# Patient Record
Sex: Male | Born: 1955 | Hispanic: No | Marital: Married | Smoking: Never smoker
Health system: Southern US, Community
[De-identification: ages and names within clinical notes are randomized; demographics above are authoritative.]

## PROBLEM LIST (undated history)

## (undated) ENCOUNTER — Emergency Department (HOSPITAL_COMMUNITY)

## (undated) DIAGNOSIS — N201 Calculus of ureter: Secondary | ICD-10-CM

## (undated) DIAGNOSIS — Z86010 Personal history of colonic polyps: Secondary | ICD-10-CM

## (undated) DIAGNOSIS — R9431 Abnormal electrocardiogram [ECG] [EKG]: Secondary | ICD-10-CM

## (undated) DIAGNOSIS — Z9221 Personal history of antineoplastic chemotherapy: Secondary | ICD-10-CM

## (undated) DIAGNOSIS — E785 Hyperlipidemia, unspecified: Secondary | ICD-10-CM

## (undated) DIAGNOSIS — E119 Type 2 diabetes mellitus without complications: Secondary | ICD-10-CM

## (undated) DIAGNOSIS — E669 Obesity, unspecified: Secondary | ICD-10-CM

## (undated) DIAGNOSIS — F411 Generalized anxiety disorder: Secondary | ICD-10-CM

## (undated) DIAGNOSIS — K589 Irritable bowel syndrome without diarrhea: Secondary | ICD-10-CM

## (undated) DIAGNOSIS — Z860101 Personal history of adenomatous and serrated colon polyps: Secondary | ICD-10-CM

## (undated) DIAGNOSIS — E291 Testicular hypofunction: Secondary | ICD-10-CM

## (undated) DIAGNOSIS — Z794 Long term (current) use of insulin: Secondary | ICD-10-CM

## (undated) DIAGNOSIS — Z973 Presence of spectacles and contact lenses: Secondary | ICD-10-CM

## (undated) DIAGNOSIS — D649 Anemia, unspecified: Secondary | ICD-10-CM

## (undated) DIAGNOSIS — G4700 Insomnia, unspecified: Secondary | ICD-10-CM

## (undated) DIAGNOSIS — E559 Vitamin D deficiency, unspecified: Secondary | ICD-10-CM

## (undated) HISTORY — DX: Type 2 diabetes mellitus without complications: E11.9

## (undated) HISTORY — DX: Hyperlipidemia, unspecified: E78.5

## (undated) HISTORY — PX: JOINT REPLACEMENT: SHX530

## (undated) HISTORY — DX: Vitamin D deficiency, unspecified: E55.9

## (undated) HISTORY — DX: Testicular hypofunction: E29.1

## (undated) HISTORY — PX: SHOULDER SURGERY: SHX246

## (undated) HISTORY — DX: Irritable bowel syndrome, unspecified: K58.9

## (undated) HISTORY — DX: Abnormal electrocardiogram (ECG) (EKG): R94.31

## (undated) HISTORY — DX: Obesity, unspecified: E66.9

---

## 1986-11-18 HISTORY — PX: APPENDECTOMY: SHX54

## 2005-11-21 LAB — HM COLONOSCOPY

## 2011-05-28 ENCOUNTER — Encounter: Payer: Self-pay | Admitting: *Deleted

## 2011-05-28 ENCOUNTER — Encounter: Payer: Self-pay | Admitting: Cardiology

## 2011-05-29 ENCOUNTER — Ambulatory Visit (INDEPENDENT_AMBULATORY_CARE_PROVIDER_SITE_OTHER): Payer: PRIVATE HEALTH INSURANCE | Admitting: Cardiology

## 2011-05-29 ENCOUNTER — Encounter: Payer: Self-pay | Admitting: Cardiology

## 2011-05-29 VITALS — BP 116/80 | HR 68 | Ht 71.0 in | Wt 216.0 lb

## 2011-05-29 DIAGNOSIS — R9431 Abnormal electrocardiogram [ECG] [EKG]: Secondary | ICD-10-CM

## 2011-05-29 DIAGNOSIS — E785 Hyperlipidemia, unspecified: Secondary | ICD-10-CM

## 2011-05-29 DIAGNOSIS — E1169 Type 2 diabetes mellitus with other specified complication: Secondary | ICD-10-CM | POA: Insufficient documentation

## 2011-05-29 DIAGNOSIS — R079 Chest pain, unspecified: Secondary | ICD-10-CM

## 2011-05-29 NOTE — Assessment & Plan Note (Addendum)
LDL was very high when checked in 5/12.  GIven diabetes, I would like to see LDL at least < 100.  Patient has been started on Crestor and is tolerating it so far.

## 2011-05-29 NOTE — Progress Notes (Signed)
PCP: Dr. Oneta Rack  55 yo with history of diabetes and hyperlipidemia presents for evaluation of abnormal ECG.  Patient saw an MD several weeks ago in another town for an insurance physical.  He was noted to have type II diabetes. A Q wave was noted in lead III, causing concern for possible old inferior MI.  He has since established Dr. Oneta Rack as PCP in [place] and has been started on treatment for diabetes and hyperlipidemia.  ECGs have continued to be mildly abnormal.  Today, ECG shows a Q in III as well as minor slight ST depression in the anterolateral leads.    In general, patient has been doing well symptomatically.  About 15 years ago, he had an episode of severe chest pain while playing golf.  This lasted 1-2 minutes.  He saw a doctor at the time and was told that the pain was caused by stress-induced PVCs.   About 3 months ago, patient had a similar episode of very severe substernal chest pain.  This occurred while he was watching TV and lasted < 1 minute.  He is active in general.  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 No exertional chest pain or dyspnea. He will occasionally get mild substernal chest tightness that seems to be associated with stress.    ECG: NSR, minor slight ST depression in the anterolateral leads, Q in lead III  Labs (5/12): HCT 45.4, TSH normal, LDL 186, K 4.2, creatinine 0.9, hgbA1c 7.1  PMH: 1. Type II diabetes 2. 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  Nonsmoker.  Occasional ETOH.   FH: No coronary disease.  Mother with pancreatic cancer.  Father died with ? Encephalitis.   ROS: All systems reviewed and negative except as per HPI.   Current Outpatient Prescriptions  Medication Sig Dispense Refill  . aspirin 81 MG tablet Take 81 mg by mouth 2 (two) times daily.        . metFORMIN (GLUMETZA) 1000 MG (MOD) 24 hr tablet Take 1,000 mg by mouth 2 (two) times daily with  a meal.        . rosuvastatin (CRESTOR) 20 MG tablet Take 20 mg by mouth daily.          BP 116/80  Pulse 68  Ht 5\' 11"  (1.803 m)  Wt 216 lb (97.977 kg)  BMI 30.13 kg/m2 General: NAD Neck: No JVD, no thyromegaly or thyroid nodule.  Lungs: Clear to auscultation bilaterally with normal respiratory effort. CV: Nondisplaced PMI.  Heart regular S1/S2, no S3/S4, no murmur.  No peripheral edema.  No carotid bruit.  Normal pedal pulses.  Abdomen: Soft, nontender, no hepatosplenomegaly, no distention.  Skin: Intact without lesions or rashes.  Neurologic: Alert and oriented x 3.  Psych: Normal affect. Extremities: No clubbing or cyanosis.  HEENT: Normal.

## 2011-05-29 NOTE — Patient Instructions (Addendum)
Your physician recommends that you schedule a follow-up appointment in: 2- 3 WEEKS WITH DR [facility]  Your physician recommends that you continue on your current medications as directed. Please refer to the Current Medication list given to you today. Your physician has requested that you have en exercise stress myoview. For further information please visit [email or web address] Please follow instruction sheet, as given.  PT'S CONVENIENCE  DX ABN EKG

## 2011-05-29 NOTE — Assessment & Plan Note (Signed)
Patient had an episode of severe but brief chest pain several months ago.  He gets mild occasional stress-induced chest tightness. He does not get exertional symptoms and has good exercise tolerance.  His ECG has some abnormality but is not specific.  Given the fact that he has diabetes, I think that the safest course will be risk stratification with an ETT-myoview.  I will arrange for this.  He will continue ASA 81 mg daily.

## 2011-06-04 ENCOUNTER — Ambulatory Visit (HOSPITAL_COMMUNITY): Payer: PRIVATE HEALTH INSURANCE | Attending: Cardiology | Admitting: Radiology

## 2011-06-04 VITALS — Ht 71.0 in | Wt 216.0 lb

## 2011-06-04 DIAGNOSIS — R0789 Other chest pain: Secondary | ICD-10-CM

## 2011-06-04 DIAGNOSIS — R0609 Other forms of dyspnea: Secondary | ICD-10-CM

## 2011-06-04 DIAGNOSIS — R9431 Abnormal electrocardiogram [ECG] [EKG]: Secondary | ICD-10-CM

## 2011-06-04 DIAGNOSIS — R0989 Other specified symptoms and signs involving the circulatory and respiratory systems: Secondary | ICD-10-CM

## 2011-06-04 DIAGNOSIS — E119 Type 2 diabetes mellitus without complications: Secondary | ICD-10-CM

## 2011-06-04 DIAGNOSIS — I4949 Other premature depolarization: Secondary | ICD-10-CM

## 2011-06-04 MED ORDER — TECHNETIUM TC 99M TETROFOSMIN IV KIT
11.0000 | PACK | Freq: Once | INTRAVENOUS | Status: AC | PRN
  Administered 2011-06-04: 11 via INTRAVENOUS

## 2011-06-04 MED ORDER — TECHNETIUM TC 99M TETROFOSMIN IV KIT
33.0000 | PACK | Freq: Once | INTRAVENOUS | Status: AC | PRN
  Administered 2011-06-04: 33 via INTRAVENOUS

## 2011-06-04 NOTE — Progress Notes (Addendum)
[facility] SITE 3 NUCLEAR MED [address] [place] [address] [phone]  Cardiology Nuclear Med Study  Zachary Reid is a 55 y.o. male [id number] 10/13/1956   Nuclear Med Background Indication for Stress Test:  Evaluation for Ischemia and Abnormal EKG History:  No previous documented CAD Cardiac Risk Factors: Lipids and NIDDM  Symptoms:  Chest Pain, Chest Tightness, DOE, Fatigue and Palpitations   Nuclear Pre-Procedure Caffeine/Decaff Intake:  None NPO After: 7:30pm   Lungs:  clear IV 0.9% NS with Angio Cath:  20g  IV Site: R Antecubital  IV Started by:  Irean Hong, RN  Chest Size (in):  46 Cup Size: n/a  Height: 5\' 11"  (1.803 m)  Weight:  216 lb (97.977 kg)  BMI:  Body mass index is 30.13 kg/(m^2). Tech Comments:  n/a    Nuclear Med Study 1 or 2 day study: 1 day  Stress Test Type:  Stress  Reading MD: Cassell Clement, MD  Order Authorizing Provider:  D.McLean  Resting Radionuclide: Technetium 64m Tetrofosmin  Resting Radionuclide Dose: 11 mCi   Stress Radionuclide:  Technetium 20m Tetrofosmin  Stress Radionuclide Dose: 33 mCi           Stress Protocol Rest HR: 72 Stress HR: 153  Rest BP: 129/82 Stress BP: 166/82  Exercise Time (min): 9:00 METS: 10.40   Predicted Max HR: 166 bpm % Max HR: 92.17 bpm Rate Pressure Product: 21308   Dose of Adenosine (mg):  n/a Dose of Lexiscan: n/a mg  Dose of Atropine (mg): n/a Dose of Dobutamine: n/a mcg/kg/min (at max HR)  Stress Test Technologist: Milana Na, EMT-P  Nuclear Technologist:  Domenic Polite, CNMT     Rest Procedure:  Myocardial perfusion imaging was performed at rest 45 minutes following the intravenous administration of Technetium 21m Tetrofosmin. Rest ECG: NSR  Stress Procedure:  The patient exercised for 9:00.  The patient stopped due to fatigue and denied any chest pain.  There were + significant ST-T wave changes, and freq pvcs/v-cuplets.  Technetium 48m Tetrofosmin was  injected at peak exercise and myocardial perfusion imaging was performed after a brief delay. Stress ECG: Significant ST abnormalities consistent with ischemia.  QPS Raw Data Images:  Normal; no motion artifact; normal heart/lung ratio. Stress Images:  Normal homogeneous uptake in all areas of the myocardium. Rest Images:  Normal homogeneous uptake in all areas of the myocardium. Subtraction (SDS):  No evidence of ischemia. Transient Ischemic Dilatation (Normal <1.22): .96 Lung/Heart Ratio (Normal <0.45):  .34  Quantitative Gated Spect Images QGS EDV:  99 ml QGS ESV:  38 ml QGS cine images:  NL LV Function; NL Wall Motion QGS EF: 61%  Impression Exercise Capacity:  Good exercise capacity. BP Response:  Normal blood pressure response. Clinical Symptoms:  No chest pain. ECG Impression:  Significant ST abnormalities consistent with ischemia. Comparison with Prior Nuclear Study: No previous nuclear study performed  Overall Impression:  Low risk stress nuclear study.  Perfusion images show no evidence of ischemia.  LV function is normal. EKG changes are consistent with false positive EKG response to exercise.   Cassell Clement   Low risk study.  Normal perfusion images.  Suspect most likely false positive ECG stress.  [employer]

## 2011-06-05 ENCOUNTER — Telehealth: Payer: Self-pay | Admitting: Cardiology

## 2011-06-05 NOTE — Telephone Encounter (Signed)
Pt calling to get stress test results, has a vacation planned wants to know if he needs to rs

## 2011-06-05 NOTE — Progress Notes (Signed)
nuc med report routed to Dr. Shirlee Latch 06/04/11 Domenic Polite

## 2011-06-05 NOTE — Telephone Encounter (Signed)
Patient called for Stress test results done 06/04/11 , he would like to know the results prior leaving.

## 2011-06-06 ENCOUNTER — Telehealth: Payer: Self-pay | Admitting: *Deleted

## 2011-06-06 NOTE — Progress Notes (Signed)
Addended by: Judithe Modest D on: 06/06/2011 12:23 PM   Modules accepted: Orders

## 2011-06-06 NOTE — Telephone Encounter (Signed)
Duplicate/error

## 2011-06-06 NOTE — Telephone Encounter (Signed)
Low risk study. Normal perfusion images. Suspect most likely false positive ECG stress.  Zachary Reid   Previous Version  Domenic Polite 06/05/2011 8:36 AM Signed  nuc med report routed to Dr. Shirlee Latch 06/04/11  Domenic Polite    06/06/11--pt given results.

## 2011-06-06 NOTE — Progress Notes (Signed)
Pt given results  

## 2011-06-19 ENCOUNTER — Encounter: Payer: Self-pay | Admitting: Cardiology

## 2011-06-19 ENCOUNTER — Ambulatory Visit (INDEPENDENT_AMBULATORY_CARE_PROVIDER_SITE_OTHER): Payer: PRIVATE HEALTH INSURANCE | Admitting: Cardiology

## 2011-06-19 DIAGNOSIS — E785 Hyperlipidemia, unspecified: Secondary | ICD-10-CM

## 2011-06-19 DIAGNOSIS — R079 Chest pain, unspecified: Secondary | ICD-10-CM

## 2011-06-19 NOTE — Assessment & Plan Note (Addendum)
He could not get coverage for Crestor through his insurance.  Dr. Oneta Rack has switched him to another statin, he is not sure which.   I will see him back in 1 year.

## 2011-06-19 NOTE — Patient Instructions (Signed)
Follow up in 1 year with Dr Shirlee Latch.  You will receive a letter in the mail 2 months before you are due.  Please call us when you receive this letter to schedule your follow up appointment.  The current medical regimen is effective;  continue present plan and medications.

## 2011-06-19 NOTE — Assessment & Plan Note (Signed)
No exertional symptoms.  Low risk ETT-myoview with no evidence for ischemia or infarction and good exercise tolerance.  Suspect that ECG changes are nonspecific.  He needs to continue ASA 81 mg daily as well as risk factor modification.

## 2011-06-19 NOTE — Progress Notes (Signed)
PCP: Dr. Oneta Rack  55 yo with history of diabetes and hyperlipidemia presented initially for evaluation of abnormal ECG.  ECG in our office showed minor slight ST depression in the anterolateral leads.   In general, patient has been doing well symptomatically.  About 15 years ago, he had an episode of severe chest pain while playing golf.  This lasted 1-2 minutes.  He saw a doctor at the time and was told that the pain was caused by stress-induced PVCs.   About 3 months ago, patient had a similar episode of very severe substernal chest pain.  This occurred while he was watching TV and lasted < 1 minute.  He is active in general.  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 No exertional chest pain or dyspnea. He will occasionally get mild substernal chest tightness that seems to be associated with stress.    Given risk factors and the chest pain episodes, I had him do an ETT-myoview.  He exercised for 9 minutes.  He had baseline slight anterolateral ST depression that worsened with exercise, but myoview images showed no perfusion defects.  Low risk study with no evidence for ischemia or infarction.   ECG: NSR, minor slight ST depression in the anterolateral leads, Q in lead III  Labs (5/12): HCT 45.4, TSH normal, LDL 186, K 4.2, creatinine 0.9, hgbA1c 7.1  PMH: 1. Type II diabetes 2. Hyperlipidemia: Myalgias with Lipitor 3. ETT-myoview (7/12) with 9' exercise, no chest pain, baseline slight anterolateral ST depression worsened with exercise, no perfusion defects.  Low risk study with no evidence for ischemia or infarction.   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  Nonsmoker.  Occasional ETOH.   FH: No coronary disease.  Mother with pancreatic cancer.  Father died with ? Encephalitis.   Current Outpatient Prescriptions  Medication Sig Dispense Refill  . aspirin 81 MG tablet Take 81 mg by mouth 2 (two) times daily.        . metFORMIN (GLUMETZA)  1000 MG (MOD) 24 hr tablet Take 1,000 mg by mouth 2 (two) times daily with a meal.          BP 117/78  Pulse 67  Ht 5\' 10"  (1.778 m)  Wt 217 lb (98.431 kg)  BMI 31.14 kg/m2 General: NAD Neck: No JVD, no thyromegaly or thyroid nodule.  Lungs: Clear to auscultation bilaterally with normal respiratory effort. CV: Nondisplaced PMI.  Heart regular S1/S2, no S3/S4, no murmur.  No peripheral edema.  No carotid bruit.  Normal pedal pulses.  Abdomen: Soft, nontender, no hepatosplenomegaly, no distention.  Skin: Intact without lesions or rashes.  Neurologic: Alert and oriented x 3.  Psych: Normal affect. Extremities: No clubbing or cyanosis.  HEENT: Normal.

## 2011-07-09 ENCOUNTER — Encounter: Payer: Self-pay | Admitting: Cardiology

## 2011-08-29 ENCOUNTER — Ambulatory Visit: Payer: PRIVATE HEALTH INSURANCE | Admitting: Cardiology

## 2012-02-19 ENCOUNTER — Emergency Department (INDEPENDENT_AMBULATORY_CARE_PROVIDER_SITE_OTHER): Payer: PRIVATE HEALTH INSURANCE

## 2012-02-19 ENCOUNTER — Emergency Department (HOSPITAL_BASED_OUTPATIENT_CLINIC_OR_DEPARTMENT_OTHER)
Admission: EM | Admit: 2012-02-19 | Discharge: 2012-02-19 | Disposition: A | Discharge status: Home / Self Care | Payer: PRIVATE HEALTH INSURANCE | Attending: Emergency Medicine | Admitting: Emergency Medicine

## 2012-02-19 ENCOUNTER — Encounter (HOSPITAL_BASED_OUTPATIENT_CLINIC_OR_DEPARTMENT_OTHER): Payer: Self-pay | Admitting: *Deleted

## 2012-02-19 DIAGNOSIS — IMO0002 Reserved for concepts with insufficient information to code with codable children: Secondary | ICD-10-CM | POA: Insufficient documentation

## 2012-02-19 DIAGNOSIS — M25469 Effusion, unspecified knee: Secondary | ICD-10-CM

## 2012-02-19 DIAGNOSIS — S8391XA Sprain of unspecified site of right knee, initial encounter: Secondary | ICD-10-CM

## 2012-02-19 DIAGNOSIS — S8990XA Unspecified injury of unspecified lower leg, initial encounter: Secondary | ICD-10-CM

## 2012-02-19 DIAGNOSIS — E119 Type 2 diabetes mellitus without complications: Secondary | ICD-10-CM | POA: Insufficient documentation

## 2012-02-19 DIAGNOSIS — W1789XA Other fall from one level to another, initial encounter: Secondary | ICD-10-CM | POA: Insufficient documentation

## 2012-02-19 DIAGNOSIS — X58XXXA Exposure to other specified factors, initial encounter: Secondary | ICD-10-CM

## 2012-02-19 DIAGNOSIS — Y93H9 Activity, other involving exterior property and land maintenance, building and construction: Secondary | ICD-10-CM | POA: Insufficient documentation

## 2012-02-19 DIAGNOSIS — M1710 Unilateral primary osteoarthritis, unspecified knee: Secondary | ICD-10-CM

## 2012-02-19 DIAGNOSIS — S99919A Unspecified injury of unspecified ankle, initial encounter: Secondary | ICD-10-CM

## 2012-02-19 MED ORDER — LIDOCAINE-EPINEPHRINE-TETRACAINE (LET) SOLUTION
NASAL | Status: AC
  Filled 2012-02-19: qty 3

## 2012-02-19 MED ORDER — HYDROCODONE-ACETAMINOPHEN 5-325 MG PO TABS: 1.0000 | ORAL_TABLET | Freq: Four times a day (QID) | ORAL | Status: AC | PRN

## 2012-02-19 MED ORDER — IBUPROFEN 800 MG PO TABS: 800.0000 mg | ORAL_TABLET | Freq: Three times a day (TID) | ORAL | Status: AC

## 2012-02-19 NOTE — Discharge Instructions (Signed)
Knee Sprain  You have a knee sprain. Sprains are painful injuries to the joints. A sprain is a partial or complete tearing of ligaments. Ligaments are tough, fibrous tissues that hold bones together at the joints. A strain (sprain) has occurred when a ligament is stretched or damaged. This injury may take several weeks to heal. This is often the same length of time as a bone fracture (break in bone) takes to heal. Even though a fracture (bone break) may not have occurred, the recovery times may be similar.  HOME CARE INSTRUCTIONS   · Rest the injured area for as long as directed by your caregiver. Then slowly start using the joint as directed by your caregiver and as the pain allows. Use crutches as directed. If the knee was splinted or casted, continue use and care as directed. If an ace bandage has been applied today, it should be removed and reapplied every 3 to 4 hours. It should not be applied tightly, but firmly enough to keep swelling down. Watch toes and feet for swelling, bluish discoloration, coldness, numbness or excessive pain. If any of these symptoms occur, remove the ace bandage and reapply more loosely.If these symptoms persist, seek medical attention.  · For the first 24 hours, lie down. Keep the injured extremity elevated on two pillows.  · Apply ice to the injured area for 15 to 20 minutes every couple hours. Repeat this 3 to 4 times per day for the first 48 hours. Put the ice in a plastic bag and place a towel between the bag of ice and your skin.  · Wear any splinting, casting, or elastic bandage applications as instructed.  · Only take over-the-counter or prescription medicines for pain, discomfort, or fever as directed by your caregiver. Do not use aspirin immediately after the injury unless instructed by your caregiver. Aspirin can cause increased bleeding and bruising of the tissues.  · If you were given crutches, continue to use them as instructed. Do not resume weight bearing on the  affected extremity until instructed.  Persistent pain and inability to use the injured area as directed for more than 2 to 3 days are warning signs. If this happens you should see a caregiver for a follow-up visit as soon as possible. Initially, a hairline fracture (this is the same as a broken bone) may not be evident on x-rays. Persistent pain and swelling indicate that further evaluation, non-weight bearing (use of crutches as instructed), and/or further x-rays are indicated. X-rays may sometimes not show a small fracture until a week or ten days later. Make a follow-up appointment with your own caregiver or one to whom we have referred you. A radiologist (specialist in reading x-rays) may re-read your X-rays. Make sure you know how you are to get your x-ray results. Do not assume everything is normal if you do not hear from us.  SEEK MEDICAL CARE IF:   · Bruising, swelling, or pain increases.  · You have cold or numb toes  · You have continuing difficulty or pain with walking.  SEEK IMMEDIATE MEDICAL CARE IF:   · Your toes are cold, numb or blue.  · The pain is not responding to medications and continues to stay the same or get worse.  MAKE SURE YOU:   · Understand these instructions.  · Will watch your condition.  · Will get help right away if you are not doing well or get worse.  Document Released: 11/04/2005 Document Revised: 10/24/2011 Document Reviewed: 10/19/2007    [employer]® Patient Information ©2012 [employer].

## 2012-02-19 NOTE — ED Provider Notes (Signed)
Medical screening examination/treatment/procedure(s) were performed by non-physician practitioner and as supervising physician I was immediately available for consultation/collaboration.   Joya Gaskins, MD 02/19/12 2340

## 2012-02-19 NOTE — ED Notes (Signed)
Pt c/o right knee injury x 6 hrs ago  

## 2012-02-19 NOTE — ED Notes (Signed)
Patient refused crutches and knee immobilizer

## 2012-02-19 NOTE — ED Provider Notes (Signed)
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 States that his right knee was flexed . Reports for several hours he was doing well but then he began to notice persistent worsening of pain at the lateral right knee. States pain was worse with bending and bearing weight on her right lower extremity. States right lateral knee also begin to swell.  Patient is a 56 y.o. male presenting with knee pain. The history is provided by the patient.  Knee Pain This is a new problem. The current episode started today. The problem occurs constantly. The problem has been gradually worsening. Associated symptoms include joint swelling. Pertinent negatives include no chills, fever, nausea, neck pain, numbness, rash, vomiting or weakness. The symptoms are aggravated by standing, walking and bending (palpation). He has tried NSAIDs for the symptoms. The treatment provided no relief.    Past Medical History  Diagnosis Date  . DM (diabetes mellitus)   . Abnormal EKG     Past Surgical History  Procedure Date  . Appendectomy   . Joint replacement     History reviewed. No pertinent family history.  History  Substance Use Topics  . Smoking status: Never Smoker   . Smokeless tobacco: Not on file  . Alcohol Use: No      Review of Systems  Constitutional: Negative for fever and chills.  HENT: Negative for neck pain.   Gastrointestinal: Negative for nausea and vomiting.  Musculoskeletal: Positive for joint swelling. Negative for back pain.       Positive for knee pain  Skin: Negative for rash.  Neurological: Negative for weakness and numbness.  All other systems reviewed and are negative.    Allergies  Review of patient's allergies indicates no known allergies.  Home Medications   Current Outpatient Rx    Name Route Sig Dispense Refill  . ASPIRIN 81 MG PO TABS Oral Take 81 mg by mouth 2 (two) times daily.     . IBUPROFEN 200 MG PO TABS Oral Take 200 mg by mouth every 6 (six) hours as needed. Patient used this medication for his knee pain.    Marland Kitchen METFORMIN HCL ER (MOD) 1000 MG PO TB24 Oral Take 1,000 mg by mouth 2 (two) times daily with a meal.        BP 129/80  Pulse 70  Temp(Src) 97.8 F (36.6 C) (Oral)  Resp 16  Ht 6' (1.829 m)  Wt 223 lb (101.152 kg)  BMI 30.24 kg/m2  SpO2 100%  Physical Exam  Constitutional: He is oriented to person, place, and time. He appears well-developed and well-nourished.  HENT:  Head: Normocephalic and atraumatic.  Eyes: Pupils are equal, round, and reactive to light.  Musculoskeletal:       Right knee: He exhibits decreased range of motion and swelling. He exhibits no effusion, no deformity, no laceration, no erythema, normal alignment, no LCL laxity, normal patellar mobility, no bony tenderness, normal meniscus and no MCL laxity. tenderness found. No medial joint line, no lateral joint line, no MCL, no LCL and no patellar tendon tenderness noted.       Right ankle: He exhibits normal pulse.       Legs: Neurological: He is alert and oriented to person, place, and time.  Skin: Skin is warm and dry. No rash noted. No erythema. No pallor.  Psychiatric: He has a normal mood and affect. His behavior is normal.    ED Course  Procedures   Dg Knee Complete 4 Views Right  02/19/2012  *RADIOLOGY REPORT*  Clinical Data: knee injury  RIGHT KNEE - COMPLETE 4+ VIEW  Comparison: None  Findings: There is a small suprapatellar joint effusion.  There is no acute fracture or subluxation identified.  Sharpening of the tibial spines and marginal spur formation is noted compatible with osteoarthritis.  No radiopaque foreign bodies or soft tissue calcifications.  IMPRESSION:  1.  Osteoarthritis. 2.  Joint effusion.  Original Report Authenticated By: Rosealee Albee, M.D.     MDM  Discussed with patient that he likely has a knee sprain. Will give both a knee sleeve and a knee immobilizer. Advised close f/u with PCP and/or ortho if pain continues. Patient agrees with plan and is ready for diagnosed.         Thomasene Lot, PA-C 02/19/12 1738

## 2012-08-04 ENCOUNTER — Telehealth: Payer: Self-pay | Admitting: Cardiology

## 2012-08-04 NOTE — Telephone Encounter (Signed)
New Problem:     I called the patient and was unable to reach them. I left a message on their voicemail with my name, the reason I called, the name of his physician, and a number to call back to schedule their appointment. 

## 2013-10-04 ENCOUNTER — Other Ambulatory Visit: Payer: BC Managed Care – PPO

## 2013-10-04 DIAGNOSIS — E782 Mixed hyperlipidemia: Secondary | ICD-10-CM

## 2013-10-04 DIAGNOSIS — R7309 Other abnormal glucose: Secondary | ICD-10-CM

## 2013-10-04 LAB — HEPATIC FUNCTION PANEL
ALT: 23 U/L (ref 0–53)
AST: 17 U/L (ref 0–37)
Albumin: 4.4 g/dL (ref 3.5–5.2)
Alkaline Phosphatase: 64 U/L (ref 39–117)
Total Bilirubin: 0.9 mg/dL (ref 0.3–1.2)

## 2013-10-04 LAB — LIPID PANEL
Cholesterol: 164 mg/dL (ref 0–200)
Total CHOL/HDL Ratio: 4.3 Ratio
VLDL: 25 mg/dL (ref 0–40)

## 2013-10-04 LAB — BASIC METABOLIC PANEL WITH GFR
Chloride: 105 mEq/L (ref 96–112)
GFR, Est African American: >89 mL/min
GFR, Est Non African American: 85 mL/min
Glucose, Bld: 158 mg/dL — ABNORMAL HIGH (ref 70–99)
Potassium: 4.5 mEq/L (ref 3.5–5.3)
Sodium: 139 mEq/L (ref 135–145)

## 2013-10-05 ENCOUNTER — Other Ambulatory Visit: Payer: Self-pay | Admitting: *Deleted

## 2013-10-05 MED ORDER — SIMVASTATIN 80 MG PO TABS: 80.0000 mg | ORAL_TABLET | Freq: Every day | ORAL | Status: DC

## 2013-10-05 MED ORDER — CANAGLIFLOZIN-METFORMIN HCL 150-1000 MG PO TABS: 150.0000 mg | ORAL_TABLET | Freq: Two times a day (BID) | ORAL | Status: DC

## 2013-11-21 ENCOUNTER — Encounter: Payer: Self-pay | Admitting: Physician Assistant

## 2013-11-21 DIAGNOSIS — E663 Overweight: Secondary | ICD-10-CM | POA: Insufficient documentation

## 2013-11-21 DIAGNOSIS — E291 Testicular hypofunction: Secondary | ICD-10-CM

## 2013-11-21 DIAGNOSIS — E119 Type 2 diabetes mellitus without complications: Secondary | ICD-10-CM

## 2013-11-21 DIAGNOSIS — I10 Essential (primary) hypertension: Secondary | ICD-10-CM

## 2013-11-21 DIAGNOSIS — Z0001 Encounter for general adult medical examination with abnormal findings: Secondary | ICD-10-CM | POA: Insufficient documentation

## 2013-11-21 DIAGNOSIS — E669 Obesity, unspecified: Secondary | ICD-10-CM

## 2013-11-21 DIAGNOSIS — Z79899 Other long term (current) drug therapy: Secondary | ICD-10-CM

## 2013-11-21 DIAGNOSIS — E559 Vitamin D deficiency, unspecified: Secondary | ICD-10-CM

## 2013-11-24 ENCOUNTER — Ambulatory Visit (INDEPENDENT_AMBULATORY_CARE_PROVIDER_SITE_OTHER): Payer: BC Managed Care – PPO | Admitting: Internal Medicine

## 2013-11-24 ENCOUNTER — Encounter: Payer: Self-pay | Admitting: Internal Medicine

## 2013-11-24 VITALS — BP 122/64 | HR 80 | Temp 99.1°F | Resp 16 | Wt 215.6 lb

## 2013-11-24 DIAGNOSIS — I10 Essential (primary) hypertension: Secondary | ICD-10-CM

## 2013-11-24 DIAGNOSIS — E782 Mixed hyperlipidemia: Secondary | ICD-10-CM

## 2013-11-24 DIAGNOSIS — R110 Nausea: Secondary | ICD-10-CM

## 2013-11-24 DIAGNOSIS — E119 Type 2 diabetes mellitus without complications: Secondary | ICD-10-CM

## 2013-11-24 DIAGNOSIS — Z79899 Other long term (current) drug therapy: Secondary | ICD-10-CM

## 2013-11-24 DIAGNOSIS — E559 Vitamin D deficiency, unspecified: Secondary | ICD-10-CM

## 2013-11-24 DIAGNOSIS — J0410 Acute tracheitis without obstruction: Secondary | ICD-10-CM

## 2013-11-24 LAB — BASIC METABOLIC PANEL WITH GFR
BUN: 15 mg/dL (ref 6–23)
CALCIUM: 9.8 mg/dL (ref 8.4–10.5)
CO2: 29 meq/L (ref 19–32)
Chloride: 102 mEq/L (ref 96–112)
Creat: 0.73 mg/dL (ref 0.50–1.35)
GFR, Est Non African American: >89 mL/min
GLUCOSE: 134 mg/dL — AB (ref 70–99)
Potassium: 4.3 mEq/L (ref 3.5–5.3)
Sodium: 139 mEq/L (ref 135–145)

## 2013-11-24 LAB — LIPID PANEL
CHOL/HDL RATIO: 4.7 ratio
Cholesterol: 193 mg/dL (ref 0–200)
HDL: 41 mg/dL (ref >39)
LDL CALC: 130 mg/dL — AB (ref 0–99)
Triglycerides: 109 mg/dL (ref <150)
VLDL: 22 mg/dL (ref 0–40)

## 2013-11-24 LAB — HEMOGLOBIN A1C
HEMOGLOBIN A1C: 7.2 % — AB (ref <5.7)
MEAN PLASMA GLUCOSE: 160 mg/dL — AB (ref <117)

## 2013-11-24 LAB — HEPATIC FUNCTION PANEL
ALT: 18 U/L (ref 0–53)
AST: 14 U/L (ref 0–37)
Albumin: 4.5 g/dL (ref 3.5–5.2)
Alkaline Phosphatase: 70 U/L (ref 39–117)
BILIRUBIN DIRECT: 0.1 mg/dL (ref 0.0–0.3)
BILIRUBIN INDIRECT: 0.7 mg/dL (ref 0.0–0.9)
TOTAL PROTEIN: 7.6 g/dL (ref 6.0–8.3)
Total Bilirubin: 0.8 mg/dL (ref 0.3–1.2)

## 2013-11-24 LAB — CBC WITH DIFFERENTIAL/PLATELET
BASOS PCT: 0 % (ref 0–1)
Basophils Absolute: 0 10*3/uL (ref 0.0–0.1)
EOS ABS: 0.2 10*3/uL (ref 0.0–0.7)
EOS PCT: 2 % (ref 0–5)
HEMATOCRIT: 45.4 % (ref 39.0–52.0)
HEMOGLOBIN: 16.3 g/dL (ref 13.0–17.0)
Lymphocytes Relative: 20 % (ref 12–46)
Lymphs Abs: 1.7 10*3/uL (ref 0.7–4.0)
MCH: 32.3 pg (ref 26.0–34.0)
MCHC: 35.9 g/dL (ref 30.0–36.0)
MCV: 89.9 fL (ref 78.0–100.0)
MONO ABS: 0.7 10*3/uL (ref 0.1–1.0)
MONOS PCT: 8 % (ref 3–12)
Neutro Abs: 6.2 10*3/uL (ref 1.7–7.7)
Neutrophils Relative %: 70 % (ref 43–77)
Platelets: 210 10*3/uL (ref 150–400)
RBC: 5.05 MIL/uL (ref 4.22–5.81)
RDW: 12.6 % (ref 11.5–15.5)
WBC: 8.8 10*3/uL (ref 4.0–10.5)

## 2013-11-24 LAB — MAGNESIUM: Magnesium: 1.9 mg/dL (ref 1.5–2.5)

## 2013-11-24 MED ORDER — AZITHROMYCIN 250 MG PO TABS: ORAL_TABLET | ORAL | Status: DC

## 2013-11-24 MED ORDER — PREDNISONE 20 MG PO TABS: ORAL_TABLET | ORAL | Status: DC

## 2013-11-24 MED ORDER — METFORMIN HCL ER 500 MG PO TB24: ORAL_TABLET | ORAL | Status: DC

## 2013-11-24 MED ORDER — HYDROCODONE-ACETAMINOPHEN 5-325 MG PO TABS: ORAL_TABLET | ORAL | Status: DC

## 2013-11-24 MED ORDER — HYOSCYAMINE SULFATE 0.125 MG SL SUBL: SUBLINGUAL_TABLET | SUBLINGUAL | Status: DC

## 2013-11-24 NOTE — Patient Instructions (Addendum)
Continue diet & medications same as discussed.   Further disposition pending lab results.    Bronchitis Bronchitis is the body's way of reacting to injury and/or infection (inflammation) of the bronchi. Bronchi are the air tubes that extend from the windpipe into the lungs. If the inflammation becomes severe, it may cause shortness of breath. CAUSES  Inflammation may be caused by:  A virus.  Germs (bacteria).  Dust.  Allergens.  Pollutants and many other irritants. The cells lining the bronchial tree are covered with tiny hairs (cilia). These constantly beat upward, away from the lungs, toward the mouth. This keeps the lungs free of pollutants. When these cells become too irritated and are unable to do their job, mucus begins to develop. This causes the characteristic cough of bronchitis. The cough clears the lungs when the cilia are unable to do their job. Without either of these protective mechanisms, the mucus would settle in the lungs. Then you would develop pneumonia. Smoking is a common cause of bronchitis and can contribute to pneumonia. Stopping this habit is the single most important thing you can do to help yourself. TREATMENT   Your caregiver may prescribe an antibiotic if the cough is caused by bacteria. Also, medicines that open up your airways make it easier to breathe. Your caregiver may also recommend or prescribe an expectorant. It will loosen the mucus to be coughed up. Only take over-the-counter or prescription medicines for pain, discomfort, or fever as directed by your caregiver.  Removing whatever causes the problem (smoking, for example) is critical to preventing the problem from getting worse.  Cough suppressants may be prescribed for relief of cough symptoms.  Inhaled medicines may be prescribed to help with symptoms now and to help prevent problems from returning.  For those with recurrent (chronic) bronchitis, there may be a need for steroid medicines. SEEK  IMMEDIATE MEDICAL CARE IF:   During treatment, you develop more pus-like mucus (purulent sputum).  You have a fever.  You become progressively more ill.  You have increased difficulty breathing, wheezing, or shortness of breath. It is necessary to seek immediate medical care if you are elderly or sick from any other disease. MAKE SURE YOU:   Understand these instructions.  Will watch your condition.  Will get help right away if you are not doing well or get worse. Document Released: 11/04/2005 Document Revised: 07/07/2013 Document Reviewed: 06/29/2013 [facility] Patient Information 2014 [place].  Hypertension As your heart beats, it forces blood through your arteries. This force is your blood pressure. If the pressure is too high, it is called hypertension (HTN) or high blood pressure. HTN is dangerous because you may have it and not know it. High blood pressure may mean that your heart has to work harder to pump blood. Your arteries may be narrow or stiff. The extra work puts you at risk for heart disease, stroke, and other problems.  Blood pressure consists of two numbers, a higher number over a lower, 110/72, for example. It is stated as "110 over 72." The ideal is below 120 for the top number (systolic) and under 80 for the bottom (diastolic). Write down your blood pressure today. You should pay close attention to your blood pressure if you have certain conditions such as:  Heart failure.  Prior heart attack.  Diabetes  Chronic kidney disease.  Prior stroke.  Multiple risk factors for heart disease. To see if you have HTN, your blood pressure should be measured while you are seated with  your arm held at the level of the heart. It should be measured at least twice. A one-time elevated blood pressure reading (especially in the Emergency Department) does not mean that you need treatment. There may be conditions in which the blood pressure is different between your right and  left arms. It is important to see your caregiver soon for a recheck. Most people have essential hypertension which means that there is not a specific cause. This type of high blood pressure may be lowered by changing lifestyle factors such as:  Stress.  Smoking.  Lack of exercise.  Excessive weight.  Drug/tobacco/alcohol use.  Eating less salt. Most people do not have symptoms from high blood pressure until it has caused damage to the body. Effective treatment can often prevent, delay or reduce that damage. TREATMENT  When a cause has been identified, treatment for high blood pressure is directed at the cause. There are a large number of medications to treat HTN. These fall into several categories, and your caregiver will help you select the medicines that are best for you. Medications may have side effects. You should review side effects with your caregiver. If your blood pressure stays high after you have made lifestyle changes or started on medicines,   Your medication(s) may need to be changed.  Other problems may need to be addressed.  Be certain you understand your prescriptions, and know how and when to take your medicine.  Be sure to follow up with your caregiver within the time frame advised (usually within two weeks) to have your blood pressure rechecked and to review your medications.  If you are taking more than one medicine to lower your blood pressure, make sure you know how and at what times they should be taken. Taking two medicines at the same time can result in blood pressure that is too low. SEEK IMMEDIATE MEDICAL CARE IF:  You develop a severe headache, blurred or changing vision, or confusion.  You have unusual weakness or numbness, or a faint feeling.  You have severe chest or abdominal pain, vomiting, or breathing problems. MAKE SURE YOU:   Understand these instructions.  Will watch your condition.  Will get help right away if you are not doing well or  get worse. Document Released: 11/04/2005 Document Revised: 01/27/2012 Document Reviewed: 06/24/2008 [facility] Patient Information 2014 [place].  Diabetes and Exercise Exercising regularly is important. It is not just about losing weight. It has many health benefits, such as:  Improving your overall fitness, flexibility, and endurance.  Increasing your bone density.  Helping with weight control.  Decreasing your body fat.  Increasing your muscle strength.  Reducing stress and tension.  Improving your overall health. People with diabetes who exercise gain additional benefits because exercise:  Reduces appetite.  Improves the body's use of blood sugar (glucose).  Helps lower or control blood glucose.  Decreases blood pressure.  Helps control blood lipids (such as cholesterol and triglycerides).  Improves the body's use of the hormone insulin by:  Increasing the body's insulin sensitivity.  Reducing the body's insulin needs.  Decreases the risk for heart disease because exercising:  Lowers cholesterol and triglycerides levels.  Increases the levels of good cholesterol (such as high-density lipoproteins [HDL]) in the body.  Lowers blood glucose levels. YOUR ACTIVITY PLAN  Choose an activity that you enjoy and set realistic goals. Your health care provider or diabetes educator can help you make an activity plan that works for you. You can break activities into  2 or 3 sessions throughout the day. Doing so is as good as one long session. Exercise ideas include:  Taking the dog for a walk.  Taking the stairs instead of the elevator.  Dancing to your favorite song.  Doing your favorite exercise with a friend. RECOMMENDATIONS FOR EXERCISING WITH TYPE 1 OR TYPE 2 DIABETES   Check your blood glucose before exercising. If blood glucose levels are greater than 240 mg/dL, check for urine ketones. Do not exercise if ketones are present.  Avoid injecting insulin into  areas of the body that are going to be exercised. For example, avoid injecting insulin into:  The arms when playing tennis.  The legs when jogging.  Keep a record of:  Food intake before and after you exercise.  Expected peak times of insulin action.  Blood glucose levels before and after you exercise.  The type and amount of exercise you have done.  Review your records with your health care provider. Your health care provider will help you to develop guidelines for adjusting food intake and insulin amounts before and after exercising.  If you take insulin or oral hypoglycemic agents, watch for signs and symptoms of hypoglycemia. They include:  Dizziness.  Shaking.  Sweating.  Chills.  Confusion.  Drink plenty of water while you exercise to prevent dehydration or heat stroke. Body water is lost during exercise and must be replaced.  Talk to your health care provider before starting an exercise program to make sure it is safe for you. Remember, almost any type of activity is better than none. Document Released: 01/25/2004 Document Revised: 07/07/2013 Document Reviewed: 04/13/2013 [facility] Patient Information 2014 [place].  Cholesterol Cholesterol is a white, waxy, fat-like protein needed by your body in small amounts. The liver makes all the cholesterol you need. It is carried from the liver by the blood through the blood vessels. Deposits (plaque) may build up on blood vessel walls. This makes the arteries narrower and stiffer. Plaque increases the risk for heart attack and stroke. You cannot feel your cholesterol level even if it is very high. The only way to know is by a blood test to check your lipid (fats) levels. Once you know your cholesterol levels, you should keep a record of the test results. Work with your caregiver to to keep your levels in the desired range. WHAT THE RESULTS MEAN:  Total cholesterol is a rough measure of all the cholesterol in your  blood.  LDL is the so-called bad cholesterol. This is the type that deposits cholesterol in the walls of the arteries. You want this level to be low.  HDL is the good cholesterol because it cleans the arteries and carries the LDL away. You want this level to be high.  Triglycerides are fat that the body can either burn for energy or store. High levels are closely linked to heart disease. DESIRED LEVELS:  Total cholesterol below 200.  LDL below 100 for people at risk, below 70 for very high risk.  HDL above 50 is good, above 60 is best.  Triglycerides below 150. HOW TO LOWER YOUR CHOLESTEROL:  Diet.  Choose fish or white meat chicken and [place], roasted or baked. Limit fatty cuts of red meat, fried foods, and processed meats, such as sausage and lunch meat.  Eat lots of fresh fruits and vegetables. Choose whole grains, beans, pasta, potatoes and cereals.  Use only small amounts of olive, corn or canola oils. Avoid butter, mayonnaise, shortening or palm kernel oils.  Avoid foods with trans-fats.  Use skim/nonfat milk and low-fat/nonfat yogurt and cheeses. Avoid whole milk, cream, ice cream, egg yolks and cheeses. Healthy desserts include angel food cake, ginger snaps, animal crackers, hard candy, popsicles, and low-fat/nonfat frozen yogurt. Avoid pastries, cakes, pies and cookies.  Exercise.  A regular program helps decrease LDL and raises HDL.  Helps with weight control.  Do things that increase your activity level like gardening, walking, or taking the stairs.  Medication.  May be prescribed by your caregiver to help lowering cholesterol and the risk for heart disease.  You may need medicine even if your levels are normal if you have several risk factors. HOME CARE INSTRUCTIONS   Follow your diet and exercise programs as suggested by your caregiver.  Take medications as directed.  Have blood work done when your caregiver feels it is necessary. MAKE SURE YOU:    Understand these instructions.  Will watch your condition.  Will get help right away if you are not doing well or get worse. Document Released: 07/30/2001 Document Revised: 01/27/2012 Document Reviewed: 01/20/2008 [facility] Patient Information 2014 [place], [place].  Vitamin D Deficiency Vitamin D is an important vitamin that your body needs. Having too little of it in your body is called a deficiency. A very bad deficiency can make your bones soft and can cause a condition called rickets.  Vitamin D is important to your body for different reasons, such as:   It helps your body absorb 2 minerals called calcium and phosphorus.  It helps make your bones healthy.  It may prevent some diseases, such as diabetes and multiple sclerosis.  It helps your muscles and heart. You can get vitamin D in several ways. It is a natural part of some foods. The vitamin is also added to some dairy products and cereals. Some people take vitamin D supplements. Also, your body makes vitamin D when you are in the sun. It changes the sun's rays into a form of the vitamin that your body can use. CAUSES   Not eating enough foods that contain vitamin D.  Not getting enough sunlight.  Having certain digestive system diseases that make it hard to absorb vitamin D. These diseases include Crohn's disease, chronic pancreatitis, and cystic fibrosis.  Having a surgery in which part of the stomach or small intestine is removed.  Being obese. Fat cells pull vitamin D out of your blood. That means that obese people may not have enough vitamin D left in their blood and in other body tissues.  Having chronic kidney or liver disease. RISK FACTORS Risk factors are things that make you more likely to develop a vitamin D deficiency. They include:  Being older.  Not being able to get outside very much.  Living in a nursing home.  Having had broken bones.  Having weak or thin bones (osteoporosis).  Having a disease  or condition that changes how your body absorbs vitamin D.  Having dark skin.  Some medicines such as seizure medicines or steroids.  Being overweight or obese. SYMPTOMS Mild cases of vitamin D deficiency may not have any symptoms. If you have a very bad case, symptoms may include:  Bone pain.  Muscle pain.  Falling often.  Broken bones caused by a minor injury, due to osteoporosis. DIAGNOSIS A blood test is the best way to tell if you have a vitamin D deficiency. TREATMENT Vitamin D deficiency can be treated in different ways. Treatment for vitamin D deficiency depends on what is  causing it. Options include:  Taking vitamin D supplements.  Taking a calcium supplement. Your caregiver will suggest what dose is best for you. HOME CARE INSTRUCTIONS  Take any supplements that your caregiver prescribes. Follow the directions carefully. Take only the suggested amount.  Have your blood tested 2 months after you start taking supplements.  Eat foods that contain vitamin D. Healthy choices include:  Fortified dairy products, cereals, or juices. Fortified means vitamin D has been added to the food. Check the label on the package to be sure.  Fatty fish like salmon or trout.  Eggs.  Oysters.  Do not use a tanning bed.  Keep your weight at a healthy level. Lose weight if you need to.  Keep all follow-up appointments. Your caregiver will need to perform blood tests to make sure your vitamin D deficiency is going away. SEEK MEDICAL CARE IF:  You have any questions about your treatment.  You continue to have symptoms of vitamin D deficiency.  You have nausea or vomiting.  You are constipated.  You feel confused.  You have severe abdominal or back pain. MAKE SURE YOU:  Understand these instructions.  Will watch your condition.  Will get help right away if you are not doing well or get worse. Document Released: 01/27/2012 Document Revised: 03/01/2013 Document Reviewed:  01/27/2012 [facility] Patient Information 2014 [place].

## 2013-11-24 NOTE — Progress Notes (Signed)
Patient ID: Zachary Reid, male   DOB: [birth year], 58 y.o.   MRN: [id number]   This very nice 58 y.o. MWM presents for 3 month follow up with Hypertension, Hyperlipidemia, Pre-Diabetes and Vitamin D Deficiency.    HTN predates since Dec 2013. BP has been controlled at home. Today's BP: 122/64 mmHg . He had a negative/normal Cardiolyte in Aug 2012 by Dr Aundra Dubin. Patient denies any cardiac type chest pain, palpitations, dyspnea/orthopnea/PND, dizziness, claudication, or dependent edema.   Hyperlipidemia is controlled with diet & meds. Last Cholesterol was 234, Triglycerides were 209, HDL 43 and LDL 149 off treatment. Patient denies myalgias or other med SE's.    Also, the patient has history of T2 Diabetes since May 2012  in Sept 2014 with last A1c of 7.3%. Patient denies any symptoms of reactive hypoglycemia, diabetic polys, paresthesias or visual blurring. Apparently he was intolerant to full dose Metformi n dure to diarrhea and was switched to Invokamet and became constipated and then developed polyuria and nocturia and decreased his dose from bid to 1 tab qod. He has not monitored glucoses but feels thst they have "probably been 'OK'".   Further, Patient has history of Vitamin D Deficiency 53 on treatment in 2013 and with last vitamin D of 65 in Sept 2014. Patient supplements vitamin D without any suspected side-effects.    Medication List       This list is accurate as of: 11/24/13  7:48 PM.  Always use your most recent med list.               aspirin 81 MG tablet  Take 81 mg by mouth 2 (two) times daily.     HYDROcodone-acetaminophen 5-325 MG per tablet  Commonly known as:  NORCO  1/2 to 1 tablet every 3 to 4 hours as needed for cough or pain     ibuprofen 200 MG tablet  Commonly known as:  ADVIL,MOTRIN  Take 200 mg by mouth every 6 (six) hours as needed. Patient used this medication for his knee pain.     simvastatin 80 MG tablet  Commonly known as:  ZOCOR  Take 80 mg by mouth  daily. Take 1 tab every other day          Allergies  Allergen Reactions  . Lipitor [Atorvastatin]     fatigue    PMHx:   Past Medical History  Diagnosis Date  . Abnormal EKG   . DM (diabetes mellitus)   . Hypertension   . Hyperlipidemia   . Hypogonadism male   . Vitamin D deficiency   . IBS (irritable bowel syndrome)   . Obesity     BMI 31    FHx:    Reviewed / unchanged  SHx:    Reviewed / unchanged  Systems Review: Constitutional: Denies fever, chills, wt changes, headaches, insomnia, fatigue, night sweats, change in appetite. Eyes: Denies redness, blurred vision, diplopia, discharge, itchy, watery eyes.  ENT: Denies discharge, congestion, post nasal drip, epistaxis, sore throat, earache, hearing loss, dental pain, tinnitus, vertigo, sinus pain, snoring.  CV: Denies chest pain, palpitations, irregular heartbeat, syncope, dyspnea, diaphoresis, orthopnea, PND, claudication, edema. Respiratory: denies cough, dyspnea, DOE, pleurisy, hoarseness, laryngitis, wheezing.  Gastrointestinal: Denies dysphagia, odynophagia, heartburn, reflux, water brash, abdominal pain or cramps, nausea, vomiting, bloating, diarrhea, constipation, hematemesis, melena, hematochezia,  or hemorrhoids. Genitourinary: Denies dysuria, frequency, urgency, nocturia, hesitancy, discharge, hematuria, flank pain. Musculoskeletal: Denies arthralgias, myalgias, stiffness, jt. swelling, pain, limp, strain/sprain.  Skin: Denies pruritus, rash,  hives, warts, acne, eczema, change in skin lesion(s). Neuro: No weakness, tremor, incoordination, spasms, paresthesia, or pain. Psychiatric: Denies confusion, memory loss, or sensory loss. Endo: Denies change in weight, skin, hair change.  Heme/Lymph: No excessive bleeding, bruising, orenlarged lymph nodes.  BP: 122/64  Pulse: 80  Temp: 99.1 F (37.3 C)  Resp: 16    Estimated body mass index is 29.23 kg/(m^2) as calculated from the following:   Height as of 02/19/12:  6' (1.829 m).   Weight as of this encounter: 215 lb 9.6 oz (97.796 kg).  On Exam: Appears well nourished - in no distress. Eyes: PERRLA, EOMs, conjunctiva no swelling or erythema. Sinuses: No frontal/maxillary tenderness ENT/Mouth: EAC's clear, TM's nl w/o erythema, bulging. Nares clear w/o erythema, swelling, exudates. Oropharynx clear without erythema or exudates. Oral hygiene is good. Tongue normal, non obstructing. Hearing intact.  Neck: Supple. Thyroid nl. Car 2+/2+ without bruits, nodes or JVD. Chest: Respirations nl with BS clear & equal w/o rales, rhonchi, wheezing or stridor.  Cor: Heart sounds normal w/ regular rate and rhythm without sig. murmurs, gallops, clicks, or rubs. Peripheral pulses normal and equal  without edema.  Abdomen: Soft & bowel sounds normal. Non-tender w/o guarding, rebound, hernias, masses, or organomegaly.  Lymphatics: Unremarkable.  Musculoskeletal: Full ROM all peripheral extremities, joint stability, 5/5 strength, and normal gait.  Skin: Warm, dry without exposed rashes, lesions, ecchymosis apparent.  Neuro: Cranial nerves intact, reflexes equal bilaterally. Sensory-motor testing grossly intact. Tendon reflexes grossly intact.  Pysch: Alert & oriented x 3. Insight and judgement nl & appropriate. No ideations.  Assessment and Plan:  1. Hypertension - Continue monitor blood pressure at home. Continue diet/exercise same.  2. Hyperlipidemia - Continue diet/meds pending today's labs, exercise,& lifestyle modifications. Continue monitor periodic cholesterol/liver & renal functions   3. T2 Diabetes - continue recommend prudent low glycemic diet, weight control, regular exercise, diabetic monitoring and periodic eye exams. Med & dosing pending today's labs.  4. Vitamin D Deficiency - Continue supplementation.  Recommended regular exercise, BP monitoring, weight control, and discussed med and SE's. Recommended labs to assess and monitor clinical status. Further  disposition pending results of labs.

## 2013-11-25 LAB — TSH: TSH: 1.096 u[IU]/mL (ref 0.350–4.500)

## 2013-11-25 LAB — VITAMIN D 25 HYDROXY (VIT D DEFICIENCY, FRACTURES): VIT D 25 HYDROXY: 81 ng/mL (ref 30–89)

## 2013-11-25 LAB — INSULIN, FASTING: Insulin fasting, serum: 26 u[IU]/mL (ref 3–28)

## 2014-02-24 ENCOUNTER — Ambulatory Visit (INDEPENDENT_AMBULATORY_CARE_PROVIDER_SITE_OTHER): Payer: BC Managed Care – PPO | Admitting: Physician Assistant

## 2014-02-24 ENCOUNTER — Encounter: Payer: Self-pay | Admitting: Physician Assistant

## 2014-02-24 VITALS — BP 110/72 | HR 72 | Temp 98.6°F | Resp 16 | Wt 219.0 lb

## 2014-02-24 DIAGNOSIS — I10 Essential (primary) hypertension: Secondary | ICD-10-CM

## 2014-02-24 DIAGNOSIS — E119 Type 2 diabetes mellitus without complications: Secondary | ICD-10-CM

## 2014-02-24 DIAGNOSIS — Z79899 Other long term (current) drug therapy: Secondary | ICD-10-CM

## 2014-02-24 DIAGNOSIS — E785 Hyperlipidemia, unspecified: Secondary | ICD-10-CM

## 2014-02-24 DIAGNOSIS — E669 Obesity, unspecified: Secondary | ICD-10-CM

## 2014-02-24 DIAGNOSIS — E559 Vitamin D deficiency, unspecified: Secondary | ICD-10-CM

## 2014-02-24 LAB — BASIC METABOLIC PANEL WITH GFR
BUN: 14 mg/dL (ref 6–23)
CALCIUM: 9.9 mg/dL (ref 8.4–10.5)
CHLORIDE: 104 meq/L (ref 96–112)
CO2: 26 mEq/L (ref 19–32)
CREATININE: 0.97 mg/dL (ref 0.50–1.35)
GFR, EST NON AFRICAN AMERICAN: 86 mL/min
Glucose, Bld: 201 mg/dL — ABNORMAL HIGH (ref 70–99)
Potassium: 4.8 mEq/L (ref 3.5–5.3)
Sodium: 141 mEq/L (ref 135–145)

## 2014-02-24 LAB — LIPID PANEL
Cholesterol: 151 mg/dL (ref 0–200)
HDL: 40 mg/dL (ref >39)
LDL CALC: 88 mg/dL (ref 0–99)
Total CHOL/HDL Ratio: 3.8 Ratio
Triglycerides: 114 mg/dL (ref <150)
VLDL: 23 mg/dL (ref 0–40)

## 2014-02-24 LAB — HEPATIC FUNCTION PANEL
ALBUMIN: 4.6 g/dL (ref 3.5–5.2)
ALK PHOS: 71 U/L (ref 39–117)
ALT: 36 U/L (ref 0–53)
AST: 24 U/L (ref 0–37)
BILIRUBIN DIRECT: 0.1 mg/dL (ref 0.0–0.3)
BILIRUBIN INDIRECT: 0.7 mg/dL (ref 0.2–1.2)
BILIRUBIN TOTAL: 0.8 mg/dL (ref 0.2–1.2)
Total Protein: 7.4 g/dL (ref 6.0–8.3)

## 2014-02-24 LAB — CBC WITH DIFFERENTIAL/PLATELET
BASOS ABS: 0 10*3/uL (ref 0.0–0.1)
BASOS PCT: 0 % (ref 0–1)
EOS PCT: 4 % (ref 0–5)
Eosinophils Absolute: 0.2 10*3/uL (ref 0.0–0.7)
HEMATOCRIT: 42.6 % (ref 39.0–52.0)
Hemoglobin: 15.2 g/dL (ref 13.0–17.0)
LYMPHS PCT: 33 % (ref 12–46)
Lymphs Abs: 1.7 10*3/uL (ref 0.7–4.0)
MCH: 31.5 pg (ref 26.0–34.0)
MCHC: 35.7 g/dL (ref 30.0–36.0)
MCV: 88.4 fL (ref 78.0–100.0)
MONO ABS: 0.5 10*3/uL (ref 0.1–1.0)
Monocytes Relative: 9 % (ref 3–12)
Neutro Abs: 2.8 10*3/uL (ref 1.7–7.7)
Neutrophils Relative %: 54 % (ref 43–77)
PLATELETS: 188 10*3/uL (ref 150–400)
RBC: 4.82 MIL/uL (ref 4.22–5.81)
RDW: 13.2 % (ref 11.5–15.5)
WBC: 5.2 10*3/uL (ref 4.0–10.5)

## 2014-02-24 LAB — HEMOGLOBIN A1C
Hgb A1c MFr Bld: 7.6 % — ABNORMAL HIGH (ref <5.7)
Mean Plasma Glucose: 171 mg/dL — ABNORMAL HIGH (ref <117)

## 2014-02-24 LAB — TSH: TSH: 1.05 u[IU]/mL (ref 0.350–4.500)

## 2014-02-24 LAB — MAGNESIUM: MAGNESIUM: 2 mg/dL (ref 1.5–2.5)

## 2014-02-24 NOTE — Patient Instructions (Signed)
We are starting you on Metformin to prevent or treat diabetes. Metformin does not cause low blood sugars. In order to create energy your cells need insulin and sugar but sometime your cells do not accept the insulin and this can cause increased sugars and decreased energy. The Metformin helps your cells accept insulin and the sugar to give you more energy.   The two most common side effects are nausea and diarrhea, follow these rules to avoid it! You can take imodium per box instructions when starting metformin if needed.   Rules of metformin: 1) start out slow with only one pill daily. Our goal for you is 4 pills a day or 2000mg total.  2) take with your largest meal. 3) Take with least amount of carbs.   Call if you have any problems.   .  Bad carbs also include fruit juice, alcohol, and sweet tea. These are empty calories that do not signal to your brain that you are full.   Please remember the good carbs are still carbs which convert into sugar. So please measure them out no more than 1/2-1 cup of rice, oatmeal, pasta, and beans.  Veggies are however free foods! Pile them on.   I like lean protein at every meal such as chicken, turkey, pork chops, cottage cheese, etc. Just do not fry these meats and please center your meal around vegetable, the meats should be a side dish.   No all fruit is created equal. Please see the list below, the fruit at the bottom is higher in sugars than the fruit at the top    

## 2014-02-24 NOTE — Progress Notes (Signed)
HPI 58 y.o. male  presents for 3 month follow up with hypertension, hyperlipidemia, diabetes and vitamin D. His blood pressure has been controlled at home, today their BP is BP: 110/72 mmHg He does workout, walks his dog. He denies chest pain, shortness of breath, dizziness.  He is on cholesterol medication, simvastatin 80 (1/2)  and denies myalgias. His cholesterol is not at goal. The cholesterol last visit was:   Lab Results  Component Value Date   CHOL 193 11/24/2013   HDL 41 11/24/2013   LDLCALC 130* 11/24/2013   TRIG 109 11/24/2013   CHOLHDL 4.7 11/24/2013   He has been working on diet and exercise for Diabetes, he has been using my fitness pal on his phone, he does not check it regularly, his sugar has been running 140, and denies nausea, paresthesia of the feet, polydipsia and polyuria. Last A1C in the office was:  Lab Results  Component Value Date   HGBA1C 7.2* 11/24/2013   Patient is on Vitamin D supplement.     Current Medications:  Current Outpatient Prescriptions on File Prior to Visit  Medication Sig Dispense Refill  . aspirin 81 MG tablet Take 81 mg by mouth 2 (two) times daily.       . metFORMIN (GLUCOPHAGE-XR) 500 MG 24 hr tablet 1 to 2 tablets 2 x day with food for diabetes  120 tablet  99  . simvastatin (ZOCOR) 80 MG tablet Take 80 mg by mouth daily. Take 1 tab every other day       No current facility-administered medications on file prior to visit.   Medical History:  Past Medical History  Diagnosis Date  . Abnormal EKG   . DM (diabetes mellitus)   . Hypertension   . Hyperlipidemia   . Hypogonadism male   . Vitamin D deficiency   . IBS (irritable bowel syndrome)   . Obesity     BMI 31   Allergies:  Allergies  Allergen Reactions  . Lipitor [Atorvastatin]     fatigue     Review of Systems: [X]  = complains of  [ ]  = denies  General: Fatigue [ ]  Fever [ ]  Chills [ ]  Weakness [ ]   Insomnia [ ]  Eyes: Redness [ ]  Blurred vision [ ]  Diplopia [ ]   ENT: Congestion  [ ]  Sinus Pain [ ]  Post Nasal Drip [ ]  Sore Throat [ ]  Earache [ ]   Cardiac: Chest pain/pressure [ ]  SOB [ ]  Orthopnea [ ]   Palpitations [ ]   Paroxysmal nocturnal dyspnea[ ]  Claudication [ ]  Edema [ ]   Pulmonary: Cough [ ]  Wheezing[ ]   SOB [ ]   Snoring [ ]   GI: Nausea [ ]  Vomiting[ ]  Dysphagia[ ]  Heartburn[ ]  Abdominal pain [ ]  Constipation [ ] ; Diarrhea [ ] ; BRBPR [ ]  Melena[ ]  GU: Hematuria[ ]  Dysuria [ ]  Nocturia[ ]  Urgency [ ]   Hesitancy [ ]  Discharge [ ]  Neuro: Headaches[ ]  Vertigo[ ]  Paresthesias[ ]  Spasm [ ]  Speech changes [ ]  Incoordination [ ]   Ortho: Arthritis [ ]  Joint pain [ ]  Muscle pain [ ]  Joint swelling [ ]  Back Pain [ ]  Skin:  Rash [ ]   Pruritis [ ]  Change in skin lesion [ ]   Psych: Depression[ ]  Anxiety[ ]  Confusion [ ]  Memory loss [ ]   Heme/Lypmh: Bleeding [ ]  Bruising [ ]  Enlarged lymph nodes [ ]   Endocrine: Visual blurring [ ]  Paresthesia [ ]  Polyuria [ ]  Polydypsea [ ]    Heat/cold intolerance [ ]   Hypoglycemia [ ]   Family history- Review and unchanged Social history- Review and unchanged Physical Exam: BP 110/72  Pulse 72  Temp(Src) 98.6 F (37 C)  Resp 16  Wt 219 lb (99.338 kg) Wt Readings from Last 3 Encounters:  02/24/14 219 lb (99.338 kg)  11/24/13 215 lb 9.6 oz (97.796 kg)  02/19/12 223 lb (101.152 kg)   General Appearance: Well nourished, in no apparent distress. Eyes: PERRLA, EOMs, conjunctiva no swelling or erythema Sinuses: No Frontal/maxillary tenderness ENT/Mouth: Ext aud canals clear, TMs without erythema, bulging. No erythema, swelling, or exudate on post pharynx.  Tonsils not swollen or erythematous. Hearing normal.  Neck: Supple, thyroid normal.  Respiratory: Respiratory effort normal, BS equal bilaterally without rales, rhonchi, wheezing or stridor.  Cardio: RRR with no MRGs. Brisk peripheral pulses without edema.  Abdomen: Soft, + BS.  Non tender, no guarding, rebound, hernias, masses. Lymphatics: Non tender without lymphadenopathy.   Musculoskeletal: Full ROM, 5/5 strength, normal gait.  Skin: Warm, dry without rashes, lesions, ecchymosis.  Neuro: Cranial nerves intact. No cerebellar symptoms. Sensation intact.  Psych: Awake and oriented X 3, normal affect, Insight and Judgment appropriate.   Assessment and Plan:  Hypertension: Continue medication, monitor blood pressure at home. Continue DASH diet. Cholesterol: Continue diet and exercise. Check cholesterol.  Diabetes-Continue diet and exercise. Check A1C, continue food diary, continue weight loss. Vitamin D Def- check level and continue medications.  Obesity- continue weight loss and food diary, bring in next visit.   Continue diet and meds as discussed. Further disposition pending results of labs. Discussed med's effects and SE's.    Vicie Mutters 10:00 AM

## 2014-02-25 LAB — INSULIN, FASTING: INSULIN FASTING, SERUM: 38 u[IU]/mL — AB (ref 3–28)

## 2014-02-25 LAB — VITAMIN D 25 HYDROXY (VIT D DEFICIENCY, FRACTURES): VIT D 25 HYDROXY: 71 ng/mL (ref 30–89)

## 2014-05-24 ENCOUNTER — Encounter: Payer: Self-pay | Admitting: Internal Medicine

## 2014-05-24 ENCOUNTER — Ambulatory Visit (INDEPENDENT_AMBULATORY_CARE_PROVIDER_SITE_OTHER): Payer: BC Managed Care – PPO | Admitting: Internal Medicine

## 2014-05-24 VITALS — BP 112/76 | HR 72 | Temp 98.2°F | Resp 16 | Ht 71.25 in | Wt 221.0 lb

## 2014-05-24 DIAGNOSIS — Z113 Encounter for screening for infections with a predominantly sexual mode of transmission: Secondary | ICD-10-CM

## 2014-05-24 DIAGNOSIS — E1129 Type 2 diabetes mellitus with other diabetic kidney complication: Secondary | ICD-10-CM

## 2014-05-24 DIAGNOSIS — Z125 Encounter for screening for malignant neoplasm of prostate: Secondary | ICD-10-CM

## 2014-05-24 DIAGNOSIS — Z1212 Encounter for screening for malignant neoplasm of rectum: Secondary | ICD-10-CM

## 2014-05-24 DIAGNOSIS — R740 Nonspecific elevation of levels of transaminase and lactic acid dehydrogenase [LDH]: Secondary | ICD-10-CM

## 2014-05-24 DIAGNOSIS — Z111 Encounter for screening for respiratory tuberculosis: Secondary | ICD-10-CM

## 2014-05-24 DIAGNOSIS — Z0000 Encounter for general adult medical examination without abnormal findings: Secondary | ICD-10-CM

## 2014-05-24 DIAGNOSIS — R7401 Elevation of levels of liver transaminase levels: Secondary | ICD-10-CM

## 2014-05-24 DIAGNOSIS — I10 Essential (primary) hypertension: Secondary | ICD-10-CM

## 2014-05-24 DIAGNOSIS — E559 Vitamin D deficiency, unspecified: Secondary | ICD-10-CM | POA: Insufficient documentation

## 2014-05-24 DIAGNOSIS — R7402 Elevation of levels of lactic acid dehydrogenase (LDH): Secondary | ICD-10-CM

## 2014-05-24 LAB — BASIC METABOLIC PANEL WITH GFR
BUN: 17 mg/dL (ref 6–23)
CO2: 26 mEq/L (ref 19–32)
Calcium: 9.8 mg/dL (ref 8.4–10.5)
Chloride: 103 mEq/L (ref 96–112)
Creat: 0.91 mg/dL (ref 0.50–1.35)
Glucose, Bld: 123 mg/dL — ABNORMAL HIGH (ref 70–99)
POTASSIUM: 4.3 meq/L (ref 3.5–5.3)
Sodium: 140 mEq/L (ref 135–145)

## 2014-05-24 LAB — HEMOGLOBIN A1C
Hgb A1c MFr Bld: 7.2 % — ABNORMAL HIGH (ref <5.7)
Mean Plasma Glucose: 160 mg/dL — ABNORMAL HIGH (ref <117)

## 2014-05-24 LAB — HEPATIC FUNCTION PANEL
ALT: 34 U/L (ref 0–53)
AST: 22 U/L (ref 0–37)
Albumin: 4.9 g/dL (ref 3.5–5.2)
Alkaline Phosphatase: 72 U/L (ref 39–117)
BILIRUBIN DIRECT: 0.2 mg/dL (ref 0.0–0.3)
BILIRUBIN INDIRECT: 0.7 mg/dL (ref 0.2–1.2)
Total Bilirubin: 0.9 mg/dL (ref 0.2–1.2)
Total Protein: 7.4 g/dL (ref 6.0–8.3)

## 2014-05-24 LAB — CBC WITH DIFFERENTIAL/PLATELET
BASOS ABS: 0 10*3/uL (ref 0.0–0.1)
BASOS PCT: 0 % (ref 0–1)
Eosinophils Absolute: 0.2 10*3/uL (ref 0.0–0.7)
Eosinophils Relative: 3 % (ref 0–5)
HCT: 42.1 % (ref 39.0–52.0)
Hemoglobin: 15.3 g/dL (ref 13.0–17.0)
Lymphocytes Relative: 37 % (ref 12–46)
Lymphs Abs: 1.9 10*3/uL (ref 0.7–4.0)
MCH: 32.1 pg (ref 26.0–34.0)
MCHC: 36.3 g/dL — AB (ref 30.0–36.0)
MCV: 88.3 fL (ref 78.0–100.0)
MONOS PCT: 6 % (ref 3–12)
Monocytes Absolute: 0.3 10*3/uL (ref 0.1–1.0)
NEUTROS ABS: 2.7 10*3/uL (ref 1.7–7.7)
Neutrophils Relative %: 54 % (ref 43–77)
Platelets: 180 10*3/uL (ref 150–400)
RBC: 4.77 MIL/uL (ref 4.22–5.81)
RDW: 13.1 % (ref 11.5–15.5)
WBC: 5 10*3/uL (ref 4.0–10.5)

## 2014-05-24 LAB — LIPID PANEL
CHOL/HDL RATIO: 4.3 ratio
CHOLESTEROL: 177 mg/dL (ref 0–200)
HDL: 41 mg/dL (ref >39)
LDL Cholesterol: 104 mg/dL — ABNORMAL HIGH (ref 0–99)
TRIGLYCERIDES: 160 mg/dL — AB (ref <150)
VLDL: 32 mg/dL (ref 0–40)

## 2014-05-24 LAB — MAGNESIUM: Magnesium: 2.1 mg/dL (ref 1.5–2.5)

## 2014-05-24 LAB — VITAMIN B12: VITAMIN B 12: 322 pg/mL (ref 211–911)

## 2014-05-24 NOTE — Progress Notes (Signed)
Patient ID: Zachary Reid, male   DOB: 10-06-1956, 58 y.o.   MRN: [id number]  Annual Screening Comprehensive Examination  This very nice 59 y.o.MWM presents for complete physical.  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    HTN predates since Dec 2013. Patient's BP has been controlled at home.Today's BP: 112/76 mmHg. Patient denies any cardiac symptoms as chest pain, palpitations, shortness of breath, dizziness or ankle swelling.   Patient's hyperlipidemia is controlled with diet and medications. Patient denies myalgias or other medication SE's. Last cholesterol last visit was at goal as below in Apr 2015. 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 Patient denies reactive hypoglycemic symptoms, visual blurring, diabetic polys, or paresthesias.   Patient also has Hx/o Testosterone Deficiency with low T 260.71 in June 2013, but has deferred therapy by virture of lack of perceived need. Finally, patient has history of Vitamin D Deficiency of 44 in 2013 and last vitamin D was 71 in Apr 2015.   Medication Sig  . aspirin 81 MG tablet Take 81 mg by mouth 2 (two) times daily.   . metFORMIN XR 500 MG t 1 to 2 tablets 2 x day with food for diabetes  . simvastatin  80 MG tablet Take 80 mg by mouth daily. Take 1 tab every other day   Allergies  Allergen Reactions  . Lipitor [Atorvastatin]     fatigue   Past Medical History  Diagnosis Date  . Abnormal EKG   . DM (diabetes mellitus)   . Hypertension   . Hyperlipidemia   . Hypogonadism male   . Vitamin D deficiency   . IBS (irritable bowel syndrome)   . Obesity     BMI 31   Past Surgical History  Procedure Laterality Date  . Appendectomy    . Joint replacement     Family History  Problem  Relation Age of Onset  . Cancer Mother     Pancreas  . Cancer Father     Prostate   History   Social History  . Marital Status: Single    Spouse Name: N/A    Number of Children: N/A  . Years of Education: N/A   Occupational History  . 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 Smoking status: Never Smoker   . Smokeless tobacco: Never Used  . Alcohol Use: No  . Drug Use: No  . Sexual Activity: Active    ROS Constitutional: Denies fever, chills, weight loss/gain, headaches, insomnia, fatigue, night sweats or change in appetite. Eyes: Denies redness, blurred vision, diplopia, discharge, itchy or watery eyes.  ENT: Denies discharge, congestion, post nasal drip, epistaxis, sore throat, earache, hearing loss, dental pain, Tinnitus, Vertigo, Sinus pain or snoring.  Cardio: Denies chest pain, palpitations, irregular heartbeat, syncope, dyspnea, diaphoresis, orthopnea, PND, claudication or edema Respiratory: denies cough, dyspnea, DOE, pleurisy, hoarseness, laryngitis or wheezing.  Gastrointestinal: Denies dysphagia, heartburn, reflux, water brash, pain, cramps, nausea, vomiting, bloating, diarrhea, constipation, hematemesis, melena, hematochezia, jaundice or hemorrhoids Genitourinary: Denies dysuria, frequency, urgency, nocturia, hesitancy, discharge, hematuria or flank pain Musculoskeletal: Denies arthralgia, myalgia, stiffness, Jt. Swelling, pain, limp or strain/sprain. Skin: Denies  puritis, rash, hives, warts, acne, eczema or change in skin lesion Neuro: No weakness, tremor, incoordination, spasms, paresthesia or pain Psychiatric: Denies confusion, memory loss or sensory loss Endocrine: Denies change in weight, skin, hair change, nocturia, and paresthesia, diabetic polys, visual blurring or hyper / hypo glycemic episodes.  Heme/Lymph: No excessive bleeding, bruising or enlarged lymph nodes.  Physical Exam  BP 112/76   Pulse 72  Temp(Src) 98.2 F (36.8 C) (Temporal)  Resp 16  Ht 5' 11.25" (1.81 m)  Wt 221 lb (100.245 kg)  BMI 30.60 kg/m2  General Appearance: Well nourished, in no apparent distress. Eyes: PERRLA, EOMs, conjunctiva no swelling or erythema, normal fundi and vessels. Sinuses: No frontal/maxillary tenderness ENT/Mouth: EACs patent / TMs  nl. Nares clear without erythema, swelling, mucoid exudates. Oral hygiene is good. No erythema, swelling, or exudate. Tongue normal, non-obstructing. Tonsils not swollen or erythematous. Hearing normal.  Neck: Supple, thyroid normal. No bruits, nodes or JVD. Respiratory: Respiratory effort normal.  BS equal and clear bilateral without rales, rhonci, wheezing or stridor. Cardio: Heart sounds are normal with regular rate and rhythm and no murmurs, rubs or gallops. Peripheral pulses are normal and equal bilaterally without edema. No aortic or femoral bruits. Chest: symmetric with normal excursions and percussion.  Abdomen: Flat, soft, with bowl sounds. Nontender, no guarding, rebound, hernias, masses, or organomegaly.  Lymphatics: Non tender without lymphadenopathy.  Genitourinary: No hernias.Testes nl. DRE - prostate nl for age - smooth & firm w/o nodules. Musculoskeletal: Full ROM all peripheral extremities, joint stability, 5/5 strength, and normal gait. Skin: Warm and dry without rashes, lesions, cyanosis, clubbing or  ecchymosis.  Neuro: Cranial nerves intact, reflexes equal bilaterally. Normal muscle tone, no cerebellar symptoms. Sensation intact.  Pysch: Awake and oriented X 3with normal affect, insight and judgment appropriate.   Assessment and Plan  1. Annual Screening Examination 2. Hypertension  3. Hyperlipidemia 4. 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 Vitamin D Deficiency 6. Testosterone Deficiency  Continue prudent diet as discussed, weight control, BP monitoring, regular exercise, and medications as discussed.  Discussed med effects and SE's. Routine  screening labs and tests as requested with regular follow-up as recommended.

## 2014-05-24 NOTE — Patient Instructions (Addendum)

## 2014-05-25 LAB — VITAMIN D 25 HYDROXY (VIT D DEFICIENCY, FRACTURES): Vit D, 25-Hydroxy: 54 ng/mL (ref 30–89)

## 2014-05-25 LAB — HEPATITIS C ANTIBODY: HCV Ab: NEGATIVE

## 2014-05-25 LAB — URINALYSIS, MICROSCOPIC ONLY
BACTERIA UA: NONE SEEN
CASTS: NONE SEEN
CRYSTALS: NONE SEEN
Squamous Epithelial / LPF: NONE SEEN

## 2014-05-25 LAB — RPR

## 2014-05-25 LAB — MICROALBUMIN / CREATININE URINE RATIO
Creatinine, Urine: 172.5 mg/dL
MICROALB/CREAT RATIO: 5.5 mg/g (ref 0.0–30.0)
Microalb, Ur: 0.95 mg/dL (ref 0.00–1.89)

## 2014-05-25 LAB — INSULIN, FASTING: Insulin fasting, serum: 20 u[IU]/mL (ref 3–28)

## 2014-05-25 LAB — HEPATITIS B CORE ANTIBODY, TOTAL: Hep B Core Total Ab: NONREACTIVE

## 2014-05-25 LAB — HIV ANTIBODY (ROUTINE TESTING W REFLEX): HIV: NONREACTIVE

## 2014-05-25 LAB — PSA: PSA: 0.59 ng/mL (ref <=4.00)

## 2014-05-25 LAB — TSH: TSH: 1.622 u[IU]/mL (ref 0.350–4.500)

## 2014-05-25 LAB — HEPATITIS B SURFACE ANTIBODY,QUALITATIVE: Hep B S Ab: NEGATIVE

## 2014-05-25 LAB — HEPATITIS A ANTIBODY, TOTAL: HEP A TOTAL AB: NONREACTIVE

## 2014-05-25 LAB — TESTOSTERONE: Testosterone: 359 ng/dL (ref 300–890)

## 2014-05-26 LAB — HEPATITIS B E ANTIBODY: HEPATITIS BE ANTIBODY: NONREACTIVE

## 2014-05-27 LAB — TB SKIN TEST
Induration: 0 mm
TB SKIN TEST: NEGATIVE

## 2014-09-09 ENCOUNTER — Other Ambulatory Visit: Payer: BC Managed Care – PPO

## 2014-09-09 ENCOUNTER — Ambulatory Visit: Payer: Self-pay | Admitting: Physician Assistant

## 2014-09-09 DIAGNOSIS — Z79899 Other long term (current) drug therapy: Secondary | ICD-10-CM

## 2014-09-09 DIAGNOSIS — E559 Vitamin D deficiency, unspecified: Secondary | ICD-10-CM

## 2014-09-09 DIAGNOSIS — E119 Type 2 diabetes mellitus without complications: Secondary | ICD-10-CM

## 2014-09-09 DIAGNOSIS — E785 Hyperlipidemia, unspecified: Secondary | ICD-10-CM

## 2014-09-09 DIAGNOSIS — I10 Essential (primary) hypertension: Secondary | ICD-10-CM

## 2014-09-09 LAB — TSH: TSH: 1.437 u[IU]/mL (ref 0.350–4.500)

## 2014-09-09 LAB — BASIC METABOLIC PANEL WITH GFR
BUN: 17 mg/dL (ref 6–23)
CO2: 24 mEq/L (ref 19–32)
CREATININE: 0.83 mg/dL (ref 0.50–1.35)
Calcium: 9.2 mg/dL (ref 8.4–10.5)
Chloride: 101 mEq/L (ref 96–112)
GFR, Est African American: >89 mL/min
Glucose, Bld: 189 mg/dL — ABNORMAL HIGH (ref 70–99)
Potassium: 4.6 mEq/L (ref 3.5–5.3)
Sodium: 139 mEq/L (ref 135–145)

## 2014-09-09 LAB — CBC WITH DIFFERENTIAL/PLATELET
BASOS ABS: 0.1 10*3/uL (ref 0.0–0.1)
BASOS PCT: 1 % (ref 0–1)
EOS ABS: 0.3 10*3/uL (ref 0.0–0.7)
EOS PCT: 6 % — AB (ref 0–5)
HEMATOCRIT: 44.2 % (ref 39.0–52.0)
Hemoglobin: 14.8 g/dL (ref 13.0–17.0)
Lymphocytes Relative: 38 % (ref 12–46)
Lymphs Abs: 2.1 10*3/uL (ref 0.7–4.0)
MCH: 31.5 pg (ref 26.0–34.0)
MCHC: 33.5 g/dL (ref 30.0–36.0)
MCV: 94 fL (ref 78.0–100.0)
Monocytes Absolute: 0.4 10*3/uL (ref 0.1–1.0)
Monocytes Relative: 7 % (ref 3–12)
Neutro Abs: 2.6 10*3/uL (ref 1.7–7.7)
Neutrophils Relative %: 48 % (ref 43–77)
Platelets: 177 10*3/uL (ref 150–400)
RBC: 4.7 MIL/uL (ref 4.22–5.81)
RDW: 12.6 % (ref 11.5–15.5)
WBC: 5.4 10*3/uL (ref 4.0–10.5)

## 2014-09-09 LAB — LIPID PANEL
Cholesterol: 140 mg/dL (ref 0–200)
HDL: 34 mg/dL — AB (ref >39)
LDL Cholesterol: 71 mg/dL (ref 0–99)
Total CHOL/HDL Ratio: 4.1 Ratio
Triglycerides: 177 mg/dL — ABNORMAL HIGH (ref <150)
VLDL: 35 mg/dL (ref 0–40)

## 2014-09-09 LAB — HEPATIC FUNCTION PANEL
ALBUMIN: 4.5 g/dL (ref 3.5–5.2)
ALT: 37 U/L (ref 0–53)
AST: 32 U/L (ref 0–37)
Alkaline Phosphatase: 61 U/L (ref 39–117)
Bilirubin, Direct: 0.1 mg/dL (ref 0.0–0.3)
Indirect Bilirubin: 0.5 mg/dL (ref 0.2–1.2)
TOTAL PROTEIN: 6.9 g/dL (ref 6.0–8.3)
Total Bilirubin: 0.6 mg/dL (ref 0.2–1.2)

## 2014-09-09 LAB — MAGNESIUM: Magnesium: 2 mg/dL (ref 1.5–2.5)

## 2014-09-10 LAB — HEMOGLOBIN A1C
Hgb A1c MFr Bld: 7.4 % — ABNORMAL HIGH (ref <5.7)
Mean Plasma Glucose: 166 mg/dL — ABNORMAL HIGH (ref <117)

## 2014-09-10 LAB — INSULIN, FASTING: Insulin fasting, serum: 2.6 u[IU]/mL (ref 2.0–19.6)

## 2014-09-10 LAB — VITAMIN D 25 HYDROXY (VIT D DEFICIENCY, FRACTURES): VIT D 25 HYDROXY: 62 ng/mL (ref 30–89)

## 2014-10-28 ENCOUNTER — Other Ambulatory Visit: Payer: Self-pay | Admitting: *Deleted

## 2014-10-28 MED ORDER — SIMVASTATIN 80 MG PO TABS: 80.0000 mg | ORAL_TABLET | Freq: Every day | ORAL | Status: DC

## 2014-12-13 ENCOUNTER — Ambulatory Visit (INDEPENDENT_AMBULATORY_CARE_PROVIDER_SITE_OTHER): Payer: BLUE CROSS/BLUE SHIELD | Admitting: Internal Medicine

## 2014-12-13 ENCOUNTER — Encounter: Payer: Self-pay | Admitting: Internal Medicine

## 2014-12-13 VITALS — BP 110/78 | HR 72 | Temp 98.1°F | Resp 16 | Ht 71.25 in | Wt 224.0 lb

## 2014-12-13 DIAGNOSIS — E1129 Type 2 diabetes mellitus with other diabetic kidney complication: Secondary | ICD-10-CM

## 2014-12-13 DIAGNOSIS — E1165 Type 2 diabetes mellitus with hyperglycemia: Secondary | ICD-10-CM

## 2014-12-13 DIAGNOSIS — E291 Testicular hypofunction: Secondary | ICD-10-CM

## 2014-12-13 DIAGNOSIS — M25549 Pain in joints of unspecified hand: Secondary | ICD-10-CM

## 2014-12-13 DIAGNOSIS — E785 Hyperlipidemia, unspecified: Secondary | ICD-10-CM

## 2014-12-13 DIAGNOSIS — Z79899 Other long term (current) drug therapy: Secondary | ICD-10-CM

## 2014-12-13 DIAGNOSIS — IMO0002 Reserved for concepts with insufficient information to code with codable children: Secondary | ICD-10-CM

## 2014-12-13 DIAGNOSIS — E559 Vitamin D deficiency, unspecified: Secondary | ICD-10-CM

## 2014-12-13 DIAGNOSIS — M79643 Pain in unspecified hand: Secondary | ICD-10-CM

## 2014-12-13 DIAGNOSIS — I10 Essential (primary) hypertension: Secondary | ICD-10-CM

## 2014-12-13 LAB — CBC WITH DIFFERENTIAL/PLATELET
BASOS PCT: 0 % (ref 0–1)
Basophils Absolute: 0 10*3/uL (ref 0.0–0.1)
Eosinophils Absolute: 0.1 10*3/uL (ref 0.0–0.7)
Eosinophils Relative: 2 % (ref 0–5)
HCT: 44.4 % (ref 39.0–52.0)
Hemoglobin: 15.5 g/dL (ref 13.0–17.0)
LYMPHS ABS: 1.8 10*3/uL (ref 0.7–4.0)
LYMPHS PCT: 31 % (ref 12–46)
MCH: 31.8 pg (ref 26.0–34.0)
MCHC: 34.9 g/dL (ref 30.0–36.0)
MCV: 91.2 fL (ref 78.0–100.0)
MONO ABS: 0.5 10*3/uL (ref 0.1–1.0)
MPV: 9.2 fL (ref 8.6–12.4)
Monocytes Relative: 9 % (ref 3–12)
Neutro Abs: 3.4 10*3/uL (ref 1.7–7.7)
Neutrophils Relative %: 58 % (ref 43–77)
PLATELETS: 197 10*3/uL (ref 150–400)
RBC: 4.87 MIL/uL (ref 4.22–5.81)
RDW: 12.8 % (ref 11.5–15.5)
WBC: 5.9 10*3/uL (ref 4.0–10.5)

## 2014-12-13 LAB — HEMOGLOBIN A1C
HEMOGLOBIN A1C: 7.6 % — AB (ref <5.7)
Mean Plasma Glucose: 171 mg/dL — ABNORMAL HIGH (ref <117)

## 2014-12-13 LAB — RHEUMATOID FACTOR: Rhuematoid fact SerPl-aCnc: <10 IU/mL (ref <=14)

## 2014-12-13 NOTE — Patient Instructions (Signed)

## 2014-12-13 NOTE — Progress Notes (Signed)
Patient ID: Zachary Reid, male   DOB: 11-28-1955, 59 y.o.   MRN: [id number]   This very nice 59 y.o. MWM presents for 3 month follow up with Hypertension, Hyperlipidemia, Pre-Diabetes and Vitamin D Deficiency.    Patient is monitored expectantly for elevated BP & BP has been controlled at home. Today's BP: 110/78 mmHg. Patient has had no complaints of any cardiac type chest pain, palpitations, dyspnea/orthopnea/PND, dizziness, claudication, or dependent edema.   Hyperlipidemia is controlled with diet & meds. Patient denies myalgias or other med SE's. Last Lipids were at goal -  Total Chol 140; HDL 34; LDL 71; Trig 177 on 09/09/2014.   Also, the patient has history of Morbid Obesity (BMI 31.01)and consequent T2_NIDDM since May 2012 with A1c 7.3% at presentation and also he has Stage 2 CKD (GFR 86 ml/min) and has had no symptoms of reactive hypoglycemia, diabetic polys, paresthesias or visual blurring.  Last A1c was 7.4% on 09/09/2014.   Other problems include Testosterone Deficiency (Level 260.71 in 2013) and patient has deferred treatment. Further, the patient also has history of Vitamin D Deficiency of 44 in 2013 and supplements vitamin D without any suspected side-effects. Last vitamin D was 62 on 09/09/2014.   Medication Sig  . aspirin 81 MG tablet Take 81 mg by mouth 2 (two) times daily.   . hyoscyamine (LEVSIN SL) 0.125 MG SL tablet   . metFORMIN (GLUCOPHAGE-XR) 500 MG 24 hr tablet 1 to 2 tablets 2 x day with food for diabetes  . simvastatin (ZOCOR) 80 MG tablet Take 1 tablet (80 mg total) by mouth daily.   Allergies  Allergen Reactions  . Lipitor [Atorvastatin]     fatigue   PMHx:   Past Medical History  Diagnosis Date  . Abnormal EKG   . DM (diabetes mellitus)   . Hypertension   . Hyperlipidemia   . Hypogonadism male   . Vitamin D deficiency   . IBS (irritable bowel syndrome)   . Obesity     BMI 31   Immunization History  Administered Date(s) Administered  . PPD Test  05/24/2014  . Pneumococcal-Unspecified 05/17/2013  . Tdap 05/17/2013   Past Surgical History  Procedure Laterality Date  . Appendectomy    . Joint replacement     FHx:    Reviewed / unchanged  SHx:    Reviewed / unchanged  Systems Review:  Constitutional: Denies fever, chills, wt changes, headaches, insomnia, fatigue, night sweats, change in appetite. Eyes: Denies redness, blurred vision, diplopia, discharge, itchy, watery eyes.  ENT: Denies discharge, congestion, post nasal drip, epistaxis, sore throat, earache, hearing loss, dental pain, tinnitus, vertigo, sinus pain, snoring.  CV: Denies chest pain, palpitations, irregular heartbeat, syncope, dyspnea, diaphoresis, orthopnea, PND, claudication or edema. Respiratory: denies cough, dyspnea, DOE, pleurisy, hoarseness, laryngitis, wheezing.  Gastrointestinal: Denies dysphagia, odynophagia, heartburn, reflux, water brash, abdominal pain or cramps, nausea, vomiting, bloating, diarrhea, constipation, hematemesis, melena, hematochezia  or hemorrhoids. Genitourinary: Denies dysuria, frequency, urgency, nocturia, hesitancy, discharge, hematuria or flank pain. Musculoskeletal: Denies  myalgias, stiffness, jt. swelling, pain, limping or strain/sprain.  C/o arthralgias in small jts of fingers. Skin: Denies pruritus, rash, hives, warts, acne, eczema or change in skin lesion(s). Neuro: No weakness, tremor, incoordination, spasms, paresthesia or pain. Psychiatric: Denies confusion, memory loss or sensory loss. Endo: Denies change in weight, skin or hair change.  Heme/Lymph: No excessive bleeding, bruising or enlarged lymph nodes.  Physical Exam  BP 110/78   Pulse 72  Temp 98.1 F  Resp 16  Ht 5' 11.25"   Wt 224 lb     BMI 31.01   Appears well nourished and in no distress. Eyes: PERRLA, EOMs, conjunctiva no swelling or erythema. Sinuses: No frontal/maxillary tenderness ENT/Mouth: EAC's clear, TM's nl w/o erythema, bulging. Nares clear w/o  erythema, swelling, exudates. Oropharynx clear without erythema or exudates. Oral hygiene is good. Tongue normal, non obstructing. Hearing intact.  Neck: Supple. Thyroid nl. Car 2+/2+ without bruits, nodes or JVD. Chest: Respirations nl with BS clear & equal w/o rales, rhonchi, wheezing or stridor.  Cor: Heart sounds normal w/ regular rate and rhythm without sig. murmurs, gallops, clicks, or rubs. Peripheral pulses normal and equal  without edema.  Abdomen: Soft & bowel sounds normal. Non-tender w/o guarding, rebound, hernias, masses, or organomegaly.  Lymphatics: Unremarkable.  Musculoskeletal: Full ROM all peripheral extremities, joint stability, 5/5 strength, and normal gait. Tender ? Nodule(s) over dorsal DIP of left index finger . No obvious synovitis. Skin: Warm, dry without exposed rashes, lesions or ecchymosis apparent.  Neuro: Cranial nerves intact, reflexes equal bilaterally. Sensory-motor testing grossly intact. Tendon reflexes grossly intact.  Pysch: Alert & oriented x 3.  Insight and judgement nl & appropriate. No ideations.  Assessment and Plan:  1. Hypertension - Continue monitor blood pressure at home. Continue diet/meds same.  2. Hyperlipidemia - Continue diet/meds, exercise,& lifestyle modifications. Continue monitor periodic cholesterol/liver & renal functions   3. T2_NIDDM w/CKD2 - Continue diet, exercise, lifestyle modifications. Monitor appropriate labs.  4. Vitamin D Deficiency - Continue supplementation.  5. Arthralgia , hands - check CCP & RA factor  6. Testosterone Deficiency -    Recommended regular exercise, BP monitoring, weight control, and discussed med and SE's. Recommended labs to assess and monitor clinical status. Further disposition pending results of labs.

## 2014-12-14 LAB — BASIC METABOLIC PANEL WITH GFR
BUN: 15 mg/dL (ref 6–23)
CO2: 26 meq/L (ref 19–32)
Calcium: 9.3 mg/dL (ref 8.4–10.5)
Chloride: 104 mEq/L (ref 96–112)
Creat: 0.82 mg/dL (ref 0.50–1.35)
GFR, Est Non African American: >89 mL/min
Glucose, Bld: 189 mg/dL — ABNORMAL HIGH (ref 70–99)
Potassium: 4.5 mEq/L (ref 3.5–5.3)
Sodium: 139 mEq/L (ref 135–145)

## 2014-12-14 LAB — LIPID PANEL
CHOL/HDL RATIO: 3.4 ratio
Cholesterol: 131 mg/dL (ref 0–200)
HDL: 38 mg/dL — ABNORMAL LOW (ref >39)
LDL Cholesterol: 73 mg/dL (ref 0–99)
TRIGLYCERIDES: 102 mg/dL (ref <150)
VLDL: 20 mg/dL (ref 0–40)

## 2014-12-14 LAB — VITAMIN D 25 HYDROXY (VIT D DEFICIENCY, FRACTURES): VIT D 25 HYDROXY: 78 ng/mL (ref 30–100)

## 2014-12-14 LAB — MAGNESIUM: MAGNESIUM: 1.9 mg/dL (ref 1.5–2.5)

## 2014-12-14 LAB — HEPATIC FUNCTION PANEL
ALT: 30 U/L (ref 0–53)
AST: 19 U/L (ref 0–37)
Albumin: 4.6 g/dL (ref 3.5–5.2)
Alkaline Phosphatase: 72 U/L (ref 39–117)
Bilirubin, Direct: 0.1 mg/dL (ref 0.0–0.3)
Indirect Bilirubin: 0.5 mg/dL (ref 0.2–1.2)
Total Bilirubin: 0.6 mg/dL (ref 0.2–1.2)
Total Protein: 7.1 g/dL (ref 6.0–8.3)

## 2014-12-14 LAB — CYCLIC CITRUL PEPTIDE ANTIBODY, IGG: Cyclic Citrullin Peptide Ab: <2 U/mL (ref 0.0–5.0)

## 2014-12-14 LAB — TSH: TSH: 1.514 u[IU]/mL (ref 0.350–4.500)

## 2014-12-14 LAB — INSULIN, FASTING: Insulin fasting, serum: 17.9 u[IU]/mL (ref 2.0–19.6)

## 2014-12-14 LAB — TESTOSTERONE: Testosterone: 272 ng/dL — ABNORMAL LOW (ref 300–890)

## 2014-12-30 ENCOUNTER — Other Ambulatory Visit: Payer: Self-pay | Admitting: Internal Medicine

## 2015-04-06 ENCOUNTER — Ambulatory Visit (INDEPENDENT_AMBULATORY_CARE_PROVIDER_SITE_OTHER): Payer: BLUE CROSS/BLUE SHIELD | Admitting: Physician Assistant

## 2015-04-06 ENCOUNTER — Encounter: Payer: Self-pay | Admitting: Physician Assistant

## 2015-04-06 VITALS — BP 128/80 | HR 76 | Temp 97.7°F | Resp 16 | Ht 71.25 in | Wt 216.0 lb

## 2015-04-06 DIAGNOSIS — I10 Essential (primary) hypertension: Secondary | ICD-10-CM

## 2015-04-06 DIAGNOSIS — IMO0002 Reserved for concepts with insufficient information to code with codable children: Secondary | ICD-10-CM

## 2015-04-06 DIAGNOSIS — E559 Vitamin D deficiency, unspecified: Secondary | ICD-10-CM

## 2015-04-06 DIAGNOSIS — E1129 Type 2 diabetes mellitus with other diabetic kidney complication: Secondary | ICD-10-CM

## 2015-04-06 DIAGNOSIS — E1165 Type 2 diabetes mellitus with hyperglycemia: Secondary | ICD-10-CM

## 2015-04-06 DIAGNOSIS — E785 Hyperlipidemia, unspecified: Secondary | ICD-10-CM

## 2015-04-06 DIAGNOSIS — E669 Obesity, unspecified: Secondary | ICD-10-CM

## 2015-04-06 DIAGNOSIS — Z79899 Other long term (current) drug therapy: Secondary | ICD-10-CM

## 2015-04-06 LAB — CBC WITH DIFFERENTIAL/PLATELET
BASOS ABS: 0 10*3/uL (ref 0.0–0.1)
Basophils Relative: 0 % (ref 0–1)
EOS PCT: 3 % (ref 0–5)
Eosinophils Absolute: 0.2 10*3/uL (ref 0.0–0.7)
HCT: 42.8 % (ref 39.0–52.0)
Hemoglobin: 15 g/dL (ref 13.0–17.0)
Lymphocytes Relative: 33 % (ref 12–46)
Lymphs Abs: 2.2 10*3/uL (ref 0.7–4.0)
MCH: 31.6 pg (ref 26.0–34.0)
MCHC: 35 g/dL (ref 30.0–36.0)
MCV: 90.3 fL (ref 78.0–100.0)
MONO ABS: 0.5 10*3/uL (ref 0.1–1.0)
MPV: 9.5 fL (ref 8.6–12.4)
Monocytes Relative: 7 % (ref 3–12)
Neutro Abs: 3.8 10*3/uL (ref 1.7–7.7)
Neutrophils Relative %: 57 % (ref 43–77)
PLATELETS: 185 10*3/uL (ref 150–400)
RBC: 4.74 MIL/uL (ref 4.22–5.81)
RDW: 13.5 % (ref 11.5–15.5)
WBC: 6.7 10*3/uL (ref 4.0–10.5)

## 2015-04-06 LAB — HEMOGLOBIN A1C
Hgb A1c MFr Bld: 7.5 % — ABNORMAL HIGH (ref <5.7)
Mean Plasma Glucose: 169 mg/dL — ABNORMAL HIGH (ref <117)

## 2015-04-06 NOTE — Patient Instructions (Addendum)
Diabetes is a very complicated disease...lets simplify it.  An easy way to look at it to understand the complications is if you think of the extra sugar floating in your blood stream as glass shards floating through your blood stream.    Diabetes affects your small vessels first: 1) The glass shards (sugar) scraps down the tiny blood vessels in your eyes and lead to diabetic retinopathy, the leading cause of blindness in the [place]. Diabetes is the leading cause of newly diagnosed adult (20 to 59 years of age) blindness in the [place].  2) The glass shards scratches down the tiny vessels of your legs leading to nerve damage called neuropathy and can lead to amputations of your feet. More than 60% of all non-traumatic amputations of lower limbs occur in people with diabetes.  3) Over time the small vessels in your brain are shredded and closed off, individually this does not cause any problems but over a long period of time many of the small vessels being blocked can lead to Vascular Dementia.   4) Your kidney's are a filter system and have a "net" that keeps certain things in the body and lets bad things out. Sugar shreds this net and leads to kidney damage and eventually failure. Decreasing the sugar that is destroying the net and certain blood pressure medications can help stop or decrease progression of kidney disease. Diabetes was the primary cause of kidney failure in 44 percent of all new cases in 2011.  5) Diabetes also destroys the small vessels in your penis that lead to erectile dysfunction. Eventually the vessels are so damaged that you may not be responsive to cialis or viagra.   Diabetes and your large vessels: Your larger vessels consist of your coronary arteries in your heart and the carotid vessels to your brain. Diabetes or even increased sugars put you at 300% increased risk of heart attack and stroke and this is why.. The sugar scrapes down your large blood vessels and your body  sees this as an internal injury and tries to repair itself. Just like you get a scab on your skin, your platelets will stick to the blood vessel wall trying to heal it. This is why we have diabetics on low dose aspirin daily, this prevents the platelets from sticking and can prevent plaque formation. In addition, your body takes cholesterol and tries to shove it into the open wound. This is why we want your LDL, or bad cholesterol, below 70.   The combination of platelets and cholesterol over 5-10 years forms plaque that can break off and cause a heart attack or stroke.   PLEASE REMEMBER:  Diabetes is preventable! Up to 85 percent of complications and morbidities among individuals with type 2 diabetes can be prevented, delayed, or effectively treated and minimized with regular visits to a health professional, appropriate monitoring and medication, and a healthy diet and lifestyle.  Recommendations For Diabetic/Prediabetic Patients:   -  Take medications as prescribed  -  Recommend Dr Joel Fuhrman's book "The End of Diabetes "  And "The End of Dieting"- Can get at  www.Amazon.com and encourage also get the Audio CD book  - AVOID Animal products, ie. Meat - red/white, Poultry and Dairy/especially cheese - Exercise at least 5 times a week for 30 minutes or preferably daily.  - No Smoking - Drink less than 2 drinks a day.  - Monitor your feet for sores - Have yearly Eye Exams - Recommend annual Flu vaccine  -   Recommend Pneumovax and Prevnar vaccines - Shingles Vaccine (Zostavax) if over 80 y.o.  Goals:   - BMI less than 24 - Fasting sugar less than 130 or less than 150 if tapering medicines to lose weight  - Systolic BP less than 127  - Diastolic BP less than 80 - Bad LDL Cholesterol less than 70 - Triglycerides less than 150  Before you even begin to attack a weight-loss plan, it pays to remember this: You are not fat. You have fat. Losing weight isn't about blame or shame; it's simply  another achievement to accomplish. Dieting is like any other skill-you have to buckle down and work at it. As long as you act in a smart, reasonable way, you'll ultimately get where you want to be. Here are some weight loss pearls for you.  1. It's Not a Diet. It's a Lifestyle Thinking of a diet as something you're on and suffering through only for the short term doesn't work. To shed weight and keep it off, you need to make permanent changes to the way you eat. It's OK to indulge occasionally, of course, but if you cut calories temporarily and then revert to your old way of eating, you'll gain back the weight quicker than you can say yo-yo. Use it to lose it. Research shows that one of the best predictors of long-term weight loss is how many pounds you drop in the first month. For that reason, nutritionists often suggest being stricter for the first two weeks of your new eating strategy to build momentum. Cut out added sugar and alcohol and avoid unrefined carbs. After that, figure out how you can reincorporate them in a way that's healthy and maintainable.  2. There's a Right Way to Exercise Working out burns calories and fat and boosts your metabolism by building muscle. But those trying to lose weight are notorious for overestimating the number of calories they burn and underestimating the amount they take in. Unfortunately, your system is biologically programmed to hold on to extra pounds and that means when you start exercising, your body senses the deficit and ramps up its hunger signals. If you're not diligent, you'll eat everything you burn and then some. Use it to lose it. Cardio gets all the exercise glory, but strength and interval training are the real heroes. They help you build lean muscle, which in turn increases your metabolism and calorie-burning ability 3. Don't Overreact to Mild Hunger Some people have a hard time losing weight because of hunger anxiety. To them, being hungry is  bad-something to be avoided at all costs-so they carry snacks with them and eat when they don't need to. Others eat because they're stressed out or bored. While you never want to get to the point of being ravenous (that's when bingeing is likely to happen), a hunger pang, a craving, or the fact that it's 3:00 p.m. should not send you racing for the vending machine or obsessing about the energy bar in your purse. Ideally, you should put off eating until your stomach is growling and it's difficult to concentrate.  Use it to lose it. When you feel the urge to eat, use the HALT method. Ask yourself, Am I really hungry? Or am I angry or anxious, lonely or bored, or tired? If you're still not certain, try the apple test. If you're truly hungry, an apple should seem delicious; if it doesn't, something else is going on. Or you can try drinking water and making yourself busy, if  you are still hungry try a healthy snack.  4. Not All Calories Are Created Equal The mechanics of weight loss are pretty simple: Take in fewer calories than you use for energy. But the kind of food you eat makes all the difference. Processed food that's high in saturated fat and refined starch or sugar can cause inflammation that disrupts the hormone signals that tell your brain you're full. The result: You eat a lot more.  Use it to lose it. Clean up your diet. Swap in whole, unprocessed foods, including vegetables, lean protein, and healthy fats that will fill you up and give you the biggest nutritional bang for your calorie buck. In a few weeks, as your brain starts receiving regular hunger and fullness signals once again, you'll notice that you feel less hungry overall and naturally start cutting back on the amount you eat.  5. Protein, Produce, and Plant-Based Fats Are Your Weight-Loss Trinity Here's why eating the three Ps regularly will help you drop pounds. Protein fills you up. You need it to build lean muscle, which keeps your  metabolism humming so that you can torch more fat. People in a weight-loss program who ate double the recommended daily allowance for protein (about 110 grams for a 150-pound woman) lost 70 percent of their weight from fat, while people who ate the RDA lost only about 40 percent, one study found. Produce is packed with filling fiber. "It's very difficult to consume too many calories if you're eating a lot of vegetables. Example: Three cups of broccoli is a lot of food, yet only 93 calories. (Fruit is another story. It can be easy to overeat and can contain a lot of calories from sugar, so be sure to monitor your intake.) Plant-based fats like olive oil and those in avocados and nuts are healthy and extra satiating.  Use it to lose it. Aim to incorporate each of the three Ps into every meal and snack. People who eat protein throughout the day are able to keep weight off, according to a study in the [place] of Clinical Nutrition. In addition to meat, poultry and seafood, good sources are beans, lentils, eggs, tofu, and yogurt. As for fat, keep portion sizes in check by measuring out salad dressing, oil, and nut butters (shoot for one to two tablespoons). Finally, eat veggies or a little fruit at every meal. People who did that consumed 308 fewer calories but didn't feel any hungrier than when they didn't eat more produce.  7. How You Eat Is As Important As What You Eat In order for your brain to register that you're full, you need to focus on what you're eating. Sit down whenever you eat, preferably at a table. Turn off the TV or computer, put down your phone, and look at your food. Smell it. Chew slowly, and don't put another bite on your fork until you swallow. When women ate lunch this attentively, they consumed 30 percent less when snacking later than those who listened to an audiobook at lunchtime, according to a study in the [place] of Nutrition. 8. Weighing Yourself Really Works The  scale provides the best evidence about whether your efforts are paying off. Seeing the numbers tick up or down or stagnate is motivation to keep going-or to rethink your approach. A 2015 study at [facility] found that daily weigh-ins helped people lose more weight, keep it off, and maintain that loss, even after two years. Use it to lose it. Step on  the scale at the same time every day for the best results. If your weight shoots up several pounds from one weigh-in to the next, don't freak out. Eating a lot of salt the night before or having your period is the likely culprit. The number should return to normal in a day or two. It's a steady climb that you need to do something about. 9. Too Much Stress and Too Little Sleep Are Your Enemies When you're tired and frazzled, your body cranks up the production of cortisol, the stress hormone that can cause carb cravings. Not getting enough sleep also boosts your levels of ghrelin, a hormone associated with hunger, while suppressing leptin, a hormone that signals fullness and satiety. People on a diet who slept only five and a half hours a night for two weeks lost 55 percent less fat and were hungrier than those who slept eight and a half hours, according to a study in the [place]. Use it to lose it. Prioritize sleep, aiming for seven hours or more a night, which research shows helps lower stress. And make sure you're getting quality zzz's. If a snoring spouse or a fidgety cat wakes you up frequently throughout the night, you may end up getting the equivalent of just four hours of sleep, according to a study from [facility]. Keep pets out of the bedroom, and use a white-noise app to drown out snoring. 10. You Will Hit a plateau-And You Can Bust Through It As you slim down, your body releases much less leptin, the fullness hormone.  If you're not strength training, start right now. Building muscle can raise your metabolism  to help you overcome a plateau. To keep your body challenged and burning calories, incorporate new moves and more intense intervals into your workouts or add another sweat session to your weekly routine. Alternatively, cut an extra 100 calories or so a day from your diet. Now that you've lost weight, your body simply doesn't need as much fuel.

## 2015-04-06 NOTE — Progress Notes (Signed)
Assessment and Plan:  1. Hypertension -Continue medication, monitor blood pressure at home. Continue DASH diet.  Reminder to go to the ER if any CP, SOB, nausea, dizziness, severe HA, changes vision/speech, left arm numbness and tingling and jaw pain.  2. Cholesterol -Continue diet and exercise. Check cholesterol.   3. Diabetes with diabetic chronic kidney disease -Continue diet and exercise. Check A1C  4. Vitamin D Def - check level and continue medications.   5. Obesity with co morbidities-  long discussion about weight loss, diet, and exercise   Continue diet and meds as discussed. Further disposition pending results of labs. Discussed med's effects and SE's.   Over 30 minutes of exam, counseling, chart review, and critical decision making was performed   HPI 59 y.o. male  presents for 3 month follow up on hypertension, cholesterol, diabetes and vitamin D deficiency.   His blood pressure has been controlled at home, today his BP is BP: 128/80 mmHg.  He does workout. He denies chest pain, shortness of breath, dizziness.  He is on cholesterol medication, simvastatin 80mg  1/2 pill daily and denies myalgias. His cholesterol is not at goal. The cholesterol was:   Lab Results  Component Value Date   CHOL 131 12/13/2014   HDL 38* 12/13/2014   LDLCALC 73 12/13/2014   TRIG 102 12/13/2014   CHOLHDL 3.4 12/13/2014   He has been working on diet and exercise for diabetes with diabetic chronic kidney disease, he is on bASA, he is not on ACE/ARB, he is on metformin takes 2-3 a day, he does not check his sugars at home, and denies  paresthesia of the feet, polydipsia, polyuria and visual disturbances. Last A1C was:  Lab Results  Component Value Date   HGBA1C 7.6* 12/13/2014  Patient is on Vitamin D supplement. Lab Results  Component Value Date   VD25OH 78 12/13/2014  BMI is Body mass index is 29.91 kg/(m^2)., he is working on diet and exercise and has done a good job with weight loss. Wt  Readings from Last 3 Encounters:  04/06/15 216 lb (97.977 kg)  12/13/14 224 lb (101.606 kg)  05/24/14 221 lb (100.245 kg)    Current Medications:  Current Outpatient Prescriptions on File Prior to Visit  Medication Sig Dispense Refill  . aspirin 81 MG tablet Take 81 mg by mouth 2 (two) times daily.     . hyoscyamine (LEVSIN SL) 0.125 MG SL tablet DISSOLVE ONE TO TWO TABLETS UNDER THE TONGUE 4 TIMES DAILY UP TO EVERY 4 HOURS AS NEEDED FOR NAUSEA, BLOATING, CRAMPING, OR DIARRHEA 100 tablet 0  . metFORMIN (GLUCOPHAGE-XR) 500 MG 24 hr tablet TAKE ONE TO TWO TABLETS BY MOUTH TWICE DAILY WITH FOOD FOR  DIABETES 120 tablet 5  . simvastatin (ZOCOR) 80 MG tablet Take 1 tablet (80 mg total) by mouth daily. 90 tablet 1   No current facility-administered medications on file prior to visit.   Medical History:  Past Medical History  Diagnosis Date  . Abnormal EKG   . DM (diabetes mellitus)   . Hypertension   . Hyperlipidemia   . Hypogonadism male   . Vitamin D deficiency   . IBS (irritable bowel syndrome)   . Obesity     BMI 31   Allergies:  Allergies  Allergen Reactions  . Lipitor [Atorvastatin]     fatigue    Review of Systems:  Review of Systems  Constitutional: Negative.   HENT: Negative.   Eyes: Negative.   Respiratory: Negative.  Cardiovascular: Negative.   Gastrointestinal: Negative.   Genitourinary: Negative.   Musculoskeletal: Negative.   Skin: Negative.   Neurological: Negative.   Endo/Heme/Allergies: Negative.   Psychiatric/Behavioral: Negative.     Family history- Review and unchanged Social history- Review and unchanged Physical Exam: BP 128/80 mmHg  Pulse 76  Temp(Src) 97.7 F (36.5 C)  Resp 16  Ht 5' 11.25" (1.81 m)  Wt 216 lb (97.977 kg)  BMI 29.91 kg/m2 Wt Readings from Last 3 Encounters:  04/06/15 216 lb (97.977 kg)  12/13/14 224 lb (101.606 kg)  05/24/14 221 lb (100.245 kg)   General Appearance: Well nourished, in no apparent distress. Eyes:  PERRLA, EOMs, conjunctiva no swelling or erythema Sinuses: No Frontal/maxillary tenderness ENT/Mouth: Ext aud canals clear, TMs without erythema, bulging. No erythema, swelling, or exudate on post pharynx.  Tonsils not swollen or erythematous. Hearing normal.  Neck: Supple, thyroid normal.  Respiratory: Respiratory effort normal, BS equal bilaterally without rales, rhonchi, wheezing or stridor.  Cardio: RRR with no MRGs. Brisk peripheral pulses without edema.  Abdomen: Soft, + BS.  Non tender, no guarding, rebound, hernias, masses. Lymphatics: Non tender without lymphadenopathy.  Musculoskeletal: Full ROM, 5/5 strength, Normal gait Skin: Warm, dry without rashes, lesions, ecchymosis.  Neuro: Cranial nerves intact. No cerebellar symptoms.  Psych: Awake and oriented X 3, normal affect, Insight and Judgment appropriate.    Vicie Mutters, PA-C 8:48 AM [facility]

## 2015-04-07 LAB — HEPATIC FUNCTION PANEL
ALBUMIN: 4.3 g/dL (ref 3.5–5.2)
ALT: 28 U/L (ref 0–53)
AST: 22 U/L (ref 0–37)
Alkaline Phosphatase: 63 U/L (ref 39–117)
BILIRUBIN TOTAL: 0.6 mg/dL (ref 0.2–1.2)
Bilirubin, Direct: 0.1 mg/dL (ref 0.0–0.3)
Indirect Bilirubin: 0.5 mg/dL (ref 0.2–1.2)
Total Protein: 7.2 g/dL (ref 6.0–8.3)

## 2015-04-07 LAB — MAGNESIUM: Magnesium: 1.9 mg/dL (ref 1.5–2.5)

## 2015-04-07 LAB — LIPID PANEL
CHOL/HDL RATIO: 4.2 ratio
CHOLESTEROL: 150 mg/dL (ref 0–200)
HDL: 36 mg/dL — ABNORMAL LOW (ref >=40)
LDL Cholesterol: 88 mg/dL (ref 0–99)
Triglycerides: 132 mg/dL (ref <150)
VLDL: 26 mg/dL (ref 0–40)

## 2015-04-07 LAB — BASIC METABOLIC PANEL WITH GFR
BUN: 15 mg/dL (ref 6–23)
CO2: 23 meq/L (ref 19–32)
CREATININE: 0.81 mg/dL (ref 0.50–1.35)
Calcium: 9.5 mg/dL (ref 8.4–10.5)
Chloride: 103 mEq/L (ref 96–112)
GFR, Est African American: >89 mL/min
Glucose, Bld: 156 mg/dL — ABNORMAL HIGH (ref 70–99)
POTASSIUM: 4.4 meq/L (ref 3.5–5.3)
Sodium: 139 mEq/L (ref 135–145)

## 2015-04-07 LAB — INSULIN, FASTING: Insulin fasting, serum: 16 u[IU]/mL (ref 2.0–19.6)

## 2015-04-07 LAB — TSH: TSH: 1.233 u[IU]/mL (ref 0.350–4.500)

## 2015-04-07 LAB — VITAMIN D 25 HYDROXY (VIT D DEFICIENCY, FRACTURES): VIT D 25 HYDROXY: 48 ng/mL (ref 30–100)

## 2015-05-04 ENCOUNTER — Other Ambulatory Visit: Payer: Self-pay

## 2015-05-04 MED ORDER — METFORMIN HCL ER 500 MG PO TB24: ORAL_TABLET | ORAL | Status: DC

## 2015-05-29 ENCOUNTER — Encounter: Payer: Self-pay | Admitting: Internal Medicine

## 2015-07-18 ENCOUNTER — Encounter: Payer: Self-pay | Admitting: Internal Medicine

## 2015-07-18 ENCOUNTER — Ambulatory Visit (INDEPENDENT_AMBULATORY_CARE_PROVIDER_SITE_OTHER): Payer: BLUE CROSS/BLUE SHIELD | Admitting: Internal Medicine

## 2015-07-18 VITALS — BP 136/82 | HR 64 | Temp 97.3°F | Resp 16 | Ht 70.5 in | Wt 212.6 lb

## 2015-07-18 DIAGNOSIS — E1165 Type 2 diabetes mellitus with hyperglycemia: Secondary | ICD-10-CM

## 2015-07-18 DIAGNOSIS — I10 Essential (primary) hypertension: Secondary | ICD-10-CM

## 2015-07-18 DIAGNOSIS — E559 Vitamin D deficiency, unspecified: Secondary | ICD-10-CM | POA: Diagnosis not present

## 2015-07-18 DIAGNOSIS — Z125 Encounter for screening for malignant neoplasm of prostate: Secondary | ICD-10-CM

## 2015-07-18 DIAGNOSIS — Z111 Encounter for screening for respiratory tuberculosis: Secondary | ICD-10-CM | POA: Diagnosis not present

## 2015-07-18 DIAGNOSIS — Z1212 Encounter for screening for malignant neoplasm of rectum: Secondary | ICD-10-CM

## 2015-07-18 DIAGNOSIS — E1129 Type 2 diabetes mellitus with other diabetic kidney complication: Secondary | ICD-10-CM

## 2015-07-18 DIAGNOSIS — Z79899 Other long term (current) drug therapy: Secondary | ICD-10-CM

## 2015-07-18 DIAGNOSIS — E785 Hyperlipidemia, unspecified: Secondary | ICD-10-CM

## 2015-07-18 DIAGNOSIS — Z0000 Encounter for general adult medical examination without abnormal findings: Secondary | ICD-10-CM

## 2015-07-18 DIAGNOSIS — E669 Obesity, unspecified: Secondary | ICD-10-CM

## 2015-07-18 DIAGNOSIS — IMO0002 Reserved for concepts with insufficient information to code with codable children: Secondary | ICD-10-CM

## 2015-07-18 DIAGNOSIS — Z6829 Body mass index (BMI) 29.0-29.9, adult: Secondary | ICD-10-CM

## 2015-07-18 DIAGNOSIS — R079 Chest pain, unspecified: Secondary | ICD-10-CM

## 2015-07-18 DIAGNOSIS — E291 Testicular hypofunction: Secondary | ICD-10-CM

## 2015-07-18 DIAGNOSIS — R5383 Other fatigue: Secondary | ICD-10-CM

## 2015-07-18 LAB — CBC WITH DIFFERENTIAL/PLATELET
BASOS ABS: 0 10*3/uL (ref 0.0–0.1)
BASOS PCT: 0 % (ref 0–1)
EOS ABS: 0.1 10*3/uL (ref 0.0–0.7)
EOS PCT: 2 % (ref 0–5)
HCT: 41.5 % (ref 39.0–52.0)
Hemoglobin: 14.5 g/dL (ref 13.0–17.0)
LYMPHS ABS: 2.3 10*3/uL (ref 0.7–4.0)
Lymphocytes Relative: 36 % (ref 12–46)
MCH: 31.7 pg (ref 26.0–34.0)
MCHC: 34.9 g/dL (ref 30.0–36.0)
MCV: 90.6 fL (ref 78.0–100.0)
MPV: 9.3 fL (ref 8.6–12.4)
Monocytes Absolute: 0.4 10*3/uL (ref 0.1–1.0)
Monocytes Relative: 7 % (ref 3–12)
NEUTROS PCT: 55 % (ref 43–77)
Neutro Abs: 3.5 10*3/uL (ref 1.7–7.7)
PLATELETS: 173 10*3/uL (ref 150–400)
RBC: 4.58 MIL/uL (ref 4.22–5.81)
RDW: 13.1 % (ref 11.5–15.5)
WBC: 6.4 10*3/uL (ref 4.0–10.5)

## 2015-07-18 LAB — BASIC METABOLIC PANEL WITH GFR
BUN: 16 mg/dL (ref 7–25)
CHLORIDE: 100 mmol/L (ref 98–110)
CO2: 25 mmol/L (ref 20–31)
Calcium: 9.4 mg/dL (ref 8.6–10.3)
Creat: 0.93 mg/dL (ref 0.70–1.33)
Glucose, Bld: 128 mg/dL — ABNORMAL HIGH (ref 65–99)
POTASSIUM: 4.2 mmol/L (ref 3.5–5.3)
SODIUM: 137 mmol/L (ref 135–146)

## 2015-07-18 LAB — HEPATIC FUNCTION PANEL
ALBUMIN: 4.5 g/dL (ref 3.6–5.1)
ALK PHOS: 56 U/L (ref 40–115)
ALT: 22 U/L (ref 9–46)
AST: 18 U/L (ref 10–35)
BILIRUBIN DIRECT: 0.1 mg/dL (ref <=0.2)
BILIRUBIN TOTAL: 0.7 mg/dL (ref 0.2–1.2)
Indirect Bilirubin: 0.6 mg/dL (ref 0.2–1.2)
Total Protein: 7.1 g/dL (ref 6.1–8.1)

## 2015-07-18 LAB — LIPID PANEL
CHOL/HDL RATIO: 4.5 ratio (ref <=5.0)
Cholesterol: 156 mg/dL (ref 125–200)
HDL: 35 mg/dL — AB (ref >=40)
LDL CALC: 85 mg/dL (ref <130)
TRIGLYCERIDES: 179 mg/dL — AB (ref <150)
VLDL: 36 mg/dL — ABNORMAL HIGH (ref <30)

## 2015-07-18 LAB — IRON AND TIBC
%SAT: 34 % (ref 15–60)
Iron: 121 ug/dL (ref 50–180)
TIBC: 352 ug/dL (ref 250–425)
UIBC: 231 ug/dL (ref 125–400)

## 2015-07-18 LAB — HEMOGLOBIN A1C
Hgb A1c MFr Bld: 7.1 % — ABNORMAL HIGH (ref <5.7)
Mean Plasma Glucose: 157 mg/dL — ABNORMAL HIGH (ref <117)

## 2015-07-18 LAB — MAGNESIUM: Magnesium: 2.3 mg/dL (ref 1.5–2.5)

## 2015-07-18 MED ORDER — TESTOSTERONE CYPIONATE 200 MG/ML IM SOLN
INTRAMUSCULAR | Status: DC
Start: 2015-07-18 — End: 2015-07-18

## 2015-07-18 MED ORDER — TESTOSTERONE CYPIONATE 200 MG/ML IM SOLN: INTRAMUSCULAR | Status: DC

## 2015-07-18 NOTE — Patient Instructions (Signed)
Recommend Adult Low dose Aspirin or coated  Aspirin 81 mg daily   To reduce risk of Colon Cancer 20 %,   Skin Cancer 26 % ,   Melanoma 46%   and   Pancreatic cancer 60% ++++++++++++++++++ Vitamin D goal is between 70-100.   Please make sure that you are taking your Vitamin D as directed.   It is very important as a natural anti-inflammatory   helping hair, skin, and nails, as well as reducing stroke and heart attack risk.   It helps your bones and helps with mood.  It also decreases numerous cancer risks so please take it as directed.   Low Vit D is associated with a 200-300% higher risk for CANCER   and 200-300% higher risk for HEART   ATTACK  &  STROKE.   ......................................  It is also associated with higher death rate at younger ages,   autoimmune diseases like Rheumatoid arthritis, Lupus, Multiple Sclerosis.     Also many other serious conditions, like depression, Alzheimer's  Dementia, infertility, muscle aches, fatigue, fibromyalgia - just to name a few.  +++++++++++++++++++  Recommend the book "The END of DIETING" by Dr Joel Fuhrman   & the book "The END of DIABETES " by Dr Joel Fuhrman  At [employer] - get book & Audio CD's     Being diabetic has a  300% increased risk for heart attack, stroke, cancer, and alzheimer- type vascular dementia. It is very important that you work harder with diet by avoiding all foods that are white. Avoid white rice (brown & wild rice is OK), white potatoes (sweetpotatoes in moderation is OK), White bread or wheat bread or anything made out of white flour like bagels, donuts, rolls, buns, biscuits, cakes, pastries, cookies, pizza crust, and pasta (made from white flour & egg whites) - vegetarian pasta or spinach or wheat pasta is OK. Multigrain breads like Arnold's or [employer], or multigrain sandwich thins or flatbreads.  Diet, exercise and weight loss can reverse and cure diabetes in the early stages.   Diet, exercise and weight loss is very important in the control and prevention of complications of diabetes which affects every system in your body, ie. Brain - dementia/stroke, eyes - glaucoma/blindness, heart - heart attack/heart failure, kidneys - dialysis, stomach - gastric paralysis, intestines - malabsorption, nerves - severe painful neuritis, circulation - gangrene & loss of a leg(s), and finally cancer and Alzheimers.    I recommend avoid fried & greasy foods,  sweets/candy, white rice (brown or wild rice or Quinoa is OK), white potatoes (sweet potatoes are OK) - anything made from white flour - bagels, doughnuts, rolls, buns, biscuits,white and wheat breads, pizza crust and traditional pasta made of white flour & egg white(vegetarian pasta or spinach or wheat pasta is OK).  Multi-grain bread is OK - like multi-grain flat bread or sandwich thins. Avoid alcohol in excess. Exercise is also important.    Eat all the vegetables you want - avoid meat, especially red meat and dairy - especially cheese.  Cheese is the most concentrated form of trans-fats which is the worst thing to clog up our arteries. Veggie cheese is OK which can be found in the fresh produce section at [employer] or [employer] or [employer]  ++++++++++++++++++++++++++  Preventive Care for Adults  A healthy lifestyle and preventive care can promote health and wellness. Preventive health guidelines for men include the following key practices:  A routine yearly physical is a good way to   check with your health care provider about your health and preventative screening. It is a chance to share any concerns and updates on your health and to receive a thorough exam.  Visit your dentist for a routine exam and preventative care every 6 months. Brush your teeth twice a day and floss once a day. Good oral hygiene prevents tooth decay and gum disease.  The frequency of eye exams is based on your age, health, family medical history, use  of contact lenses, and other factors. Follow your health care provider's recommendations for frequency of eye exams.  Eat a healthy diet. Foods such as vegetables, fruits, whole grains, low-fat dairy products, and lean protein foods contain the nutrients you need without too many calories. Decrease your intake of foods high in solid fats, added sugars, and salt. Eat the right amount of calories for you.Get information about a proper diet from your health care provider, if necessary.  Regular physical exercise is one of the most important things you can do for your health. Most adults should get at least 150 minutes of moderate-intensity exercise (any activity that increases your heart rate and causes you to sweat) each week. In addition, most adults need muscle-strengthening exercises on 2 or more days a week.  Maintain a healthy weight. The body mass index (BMI) is a screening tool to identify possible weight problems. It provides an estimate of body fat based on height and weight. Your health care provider can find your BMI and can help you achieve or maintain a healthy weight.For adults 20 years and older:  A BMI below 18.5 is considered underweight.  A BMI of 18.5 to 24.9 is normal.  A BMI of 25 to 29.9 is considered overweight.  A BMI of 30 and above is considered obese.  Maintain normal blood lipids and cholesterol levels by exercising and minimizing your intake of saturated fat. Eat a balanced diet with plenty of fruit and vegetables. Blood tests for lipids and cholesterol should begin at age 20 and be repeated every 5 years. If your lipid or cholesterol levels are high, you are over 50, or you are at high risk for heart disease, you may need your cholesterol levels checked more frequently.Ongoing high lipid and cholesterol levels should be treated with medicines if diet and exercise are not working.  If you smoke, find out from your health care provider how to quit. If you do not use  tobacco, do not start.  Lung cancer screening is recommended for adults aged 55-80 years who are at high risk for developing lung cancer because of a history of smoking. A yearly low-dose CT scan of the lungs is recommended for people who have at least a 30-pack-year history of smoking and are a current smoker or have quit within the past 15 years. A pack year of smoking is smoking an average of 1 pack of cigarettes a day for 1 year (for example: 1 pack a day for 30 years or 2 packs a day for 15 years). Yearly screening should continue until the smoker has stopped smoking for at least 15 years. Yearly screening should be stopped for people who develop a health problem that would prevent them from having lung cancer treatment.  If you choose to drink alcohol, do not have more than 2 drinks per day. One drink is considered to be 12 ounces (355 mL) of beer, 5 ounces (148 mL) of wine, or 1.5 ounces (44 mL) of liquor.  Avoid use of street   drugs. Do not share needles with anyone. Ask for help if you need support or instructions about stopping the use of drugs.  High blood pressure causes heart disease and increases the risk of stroke. Your blood pressure should be checked at least every 1-2 years. Ongoing high blood pressure should be treated with medicines, if weight loss and exercise are not effective.  If you are 45-79 years old, ask your health care provider if you should take aspirin to prevent heart disease.  Diabetes screening involves taking a blood sample to check your fasting blood sugar level. This should be done once every 3 years, after age 45, if you are within normal weight and without risk factors for diabetes. Testing should be considered at a younger age or be carried out more frequently if you are overweight and have at least 1 risk factor for diabetes.  Colorectal cancer can be detected and often prevented. Most routine colorectal cancer screening begins at the age of 50 and continues  through age 75. However, your health care provider may recommend screening at an earlier age if you have risk factors for colon cancer. On a yearly basis, your health care provider may provide home test kits to check for hidden blood in the stool. Use of a small camera at the end of a tube to directly examine the colon (sigmoidoscopy or colonoscopy) can detect the earliest forms of colorectal cancer. Talk to your health care provider about this at age 50, when routine screening begins. Direct exam of the colon should be repeated every 5-10 years through age 75, unless early forms of precancerous polyps or small growths are found.   Talk with your health care provider about prostate cancer screening.  Testicular cancer screening isrecommended for adult males. Screening includes self-exam, a health care provider exam, and other screening tests. Consult with your health care provider about any symptoms you have or any concerns you have about testicular cancer.  Use sunscreen. Apply sunscreen liberally and repeatedly throughout the day. You should seek shade when your shadow is shorter than you. Protect yourself by wearing long sleeves, pants, a wide-brimmed hat, and sunglasses year round, whenever you are outdoors.  Once a month, do a whole-body skin exam, using a mirror to look at the skin on your back. Tell your health care provider about new moles, moles that have irregular borders, moles that are larger than a pencil eraser, or moles that have changed in shape or color.  Stay current with required vaccines (immunizations).  Influenza vaccine. All adults should be immunized every year.  Tetanus, diphtheria, and acellular pertussis (Td, Tdap) vaccine. An adult who has not previously received Tdap or who does not know his vaccine status should receive 1 dose of Tdap. This initial dose should be followed by tetanus and diphtheria toxoids (Td) booster doses every 10 years. Adults with an unknown or  incomplete history of completing a 3-dose immunization series with Td-containing vaccines should begin or complete a primary immunization series including a Tdap dose. Adults should receive a Td booster every 10 years.  Varicella vaccine. An adult without evidence of immunity to varicella should receive 2 doses or a second dose if he has previously received 1 dose.  Human papillomavirus (HPV) vaccine. Males aged 13-21 years who have not received the vaccine previously should receive the 3-dose series. Males aged 22-26 years may be immunized. Immunization is recommended through the age of 26 years for any male who has sex with males and did   not get any or all doses earlier. Immunization is recommended for any person with an immunocompromised condition through the age of 26 years if he did not get any or all doses earlier. During the 3-dose series, the second dose should be obtained 4-8 weeks after the first dose. The third dose should be obtained 24 weeks after the first dose and 16 weeks after the second dose.  Zoster vaccine. One dose is recommended for adults aged 60 years or older unless certain conditions are present.    PREVNAR  - Pneumococcal 13-valent conjugate (PCV13) vaccine. When indicated, a person who is uncertain of his immunization history and has no record of immunization should receive the PCV13 vaccine. An adult aged 19 years or older who has certain medical conditions and has not been previously immunized should receive 1 dose of PCV13 vaccine. This PCV13 should be followed with a dose of pneumococcal polysaccharide (PPSV23) vaccine. The PPSV23 vaccine dose should be obtained at least 8 weeks after the dose of PCV13 vaccine. An adult aged 19 years or older who has certain medical conditions and previously received 1 or more doses of PPSV23 vaccine should receive 1 dose of PCV13. The PCV13 vaccine dose should be obtained 1 or more years after the last PPSV23 vaccine dose.    PNEUMOVAX  - Pneumococcal polysaccharide (PPSV23) vaccine. When PCV13 is also indicated, PCV13 should be obtained first. All adults aged 65 years and older should be immunized. An adult younger than age 65 years who has certain medical conditions should be immunized. Any person who resides in a nursing home or long-term care facility should be immunized. An adult smoker should be immunized. People with an immunocompromised condition and certain other conditions should receive both PCV13 and PPSV23 vaccines. People with human immunodeficiency virus (HIV) infection should be immunized as soon as possible after diagnosis. Immunization during chemotherapy or radiation therapy should be avoided. Routine use of PPSV23 vaccine is not recommended for American Indians, [place] Natives, or people younger than 65 years unless there are medical conditions that require PPSV23 vaccine. When indicated, people who have unknown immunization and have no record of immunization should receive PPSV23 vaccine. One-time revaccination 5 years after the first dose of PPSV23 is recommended for people aged 19-64 years who have chronic kidney failure, nephrotic syndrome, asplenia, or immunocompromised conditions. People who received 1-2 doses of PPSV23 before age 65 years should receive another dose of PPSV23 vaccine at age 65 years or later if at least 5 years have passed since the previous dose. Doses of PPSV23 are not needed for people immunized with PPSV23 at or after age 65 years.    Hepatitis A vaccine. Adults who wish to be protected from this disease, have certain high-risk conditions, work with hepatitis A-infected animals, work in hepatitis A research labs, or travel to or work in countries with a high rate of hepatitis A should be immunized. Adults who were previously unvaccinated and who anticipate close contact with an international adoptee during the first 60 days after arrival in the [place] from a country with a high rate of  hepatitis A should be immunized.    Hepatitis B vaccine. Adults should be immunized if they wish to be protected from this disease, have certain high-risk conditions, may be exposed to blood or other infectious body fluids, are household contacts or sex partners of hepatitis B positive people, are clients or workers in certain care facilities, or travel to or work in countries with a high   rate of hepatitis B.   Preventive Service / Frequency   Ages 40 to 64  Blood pressure check.  Lipid and cholesterol check  Lung cancer screening. / Every year if you are aged 55-80 years and have a 30-pack-year history of smoking and currently smoke or have quit within the past 15 years. Yearly screening is stopped once you have quit smoking for at least 15 years or develop a health problem that would prevent you from having lung cancer treatment.  Fecal occult blood test (FOBT) of stool. / Every year beginning at age 50 and continuing until age 75. You may not have to do this test if you get a colonoscopy every 10 years.  Flexible sigmoidoscopy** or colonoscopy.** / Every 5 years for a flexible sigmoidoscopy or every 10 years for a colonoscopy beginning at age 50 and continuing until age 75. Screening for abdominal aortic aneurysm (AAA)  by ultrasound is recommended for people who have history of high blood pressure or who are current or former smokers. 

## 2015-07-18 NOTE — Progress Notes (Signed)
Patient ID: Zachary Reid, male   DOB: July 26, 1956, 59 y.o.   MRN: [id number]    Comprehensive Examination  This very nice 59 y.o. MWM presents for complete physical.  Patient has been followed for HTN, T2_NIDDM  Prediabetes, Hyperlipidemia,Testosterone and Vitamin D Deficiency.   Patient has labile HTN monitored expectantly since 2013. Patient's BP has been controlled and today's BP: 136/82 mmHg. Patient denies any cardiac symptoms as chest pain, palpitations, shortness of breath, dizziness or ankle swelling.   Patient's hyperlipidemia is controlled with diet and medications. Patient denies myalgias or other medication SE's. Last lipids were  At goal - Cholesterol 150; HDL 36*; LDL 88; Triglycerides 132 on 04/06/2015.   Patient has T2_NIDDM since May 2012  and patient denies reactive hypoglycemic symptoms, visual blurring, diabetic polys or paresthesias. Patient does not monitor CBG's. Last A1c was 7.5% on 04/06/2015.   Also patient has low Testosterone 260.71 in 2013 & had been on replacement therapy, but stopped and now notes increased fatigue and lassitude off of treatment. Finally, patient has history of Vitamin D Deficiency and last vitamin D was 48 on 04/06/2015.    Medication Sig  . aspirin 81 MG tablet Take   2  times daily.   . hyoscyamine  SL 0.125 MG SL  1 - 2 tabs SL 4 x day as needed  GI sx's  . metFORMIN -XR 500 MG TAKE ONE TAB THREE TIMES DAILY  . simvastatin  80 MG tablet Take 1 tab daily.   Allergies  Allergen Reactions  . Lipitor [Atorvastatin]     fatigue   Past Medical History  Diagnosis Date  . Abnormal EKG   . DM (diabetes mellitus)   . Hypertension   . Hyperlipidemia   . Hypogonadism male   . Vitamin D deficiency   . IBS (irritable bowel syndrome)   . Obesity     BMI 31   Health Maintenance  Topic Date Due  . OPHTHALMOLOGY EXAM  09/28/1966  . FOOT EXAM  05/25/2015  . URINE MICROALBUMIN  05/25/2015  . INFLUENZA VACCINE  06/19/2015  . HEMOGLOBIN A1C   10/07/2015  . COLONOSCOPY  11/22/2015  . PNEUMOCOCCAL POLYSACCHARIDE VACCINE (2) 05/17/2018  . TETANUS/TDAP  05/18/2023  . Hepatitis C Screening  Completed  . HIV Screening  Completed   Immunization History  Administered Date(s) Administered  . PPD Test 05/24/2014, 07/18/2015  . Pneumococcal-Unspecified 05/17/2013  . Tdap 05/17/2013   Past Surgical History  Procedure Laterality Date  . Appendectomy    . Joint replacement     Family History  Problem Relation Age of Onset  . Cancer Mother     Pancreas  . Cancer Father     Prostate   Social History   Social History  . Marital Status: Single    Spouse Name: N/A  . Number of Children: N/A  . Years of Education: N/A   Occupational History  . 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 Smoking status: Never Smoker   . Smokeless tobacco: Never Used  . Alcohol Use: No  . Drug Use: No  . Sexual Activity: active    ROS Constitutional: Denies fever, chills, weight loss/gain, headaches, insomnia,  night sweats or change in appetite. Does c/o fatigue. Eyes: Denies redness, blurred vision, diplopia, discharge, itchy or watery eyes.  ENT: Denies discharge, congestion, post nasal drip, epistaxis, sore throat, earache, hearing loss, dental pain, Tinnitus, Vertigo, Sinus pain  or snoring.  Cardio: Denies chest pain, palpitations, irregular heartbeat, syncope, dyspnea, diaphoresis, orthopnea, PND, claudication or edema Respiratory: denies cough, dyspnea, DOE, pleurisy, hoarseness, laryngitis or wheezing.  Gastrointestinal: Denies dysphagia, heartburn, reflux, water brash, pain, cramps, nausea, vomiting, bloating, diarrhea, constipation, hematemesis, melena, hematochezia, jaundice or hemorrhoids Genitourinary: Denies dysuria, frequency, urgency, nocturia, hesitancy, discharge, hematuria or flank pain Musculoskeletal: Denies arthralgia, myalgia, stiffness, Jt. Swelling,  pain, limp or strain/sprain. Denies Falls. Skin: Denies puritis, rash, hives, warts, acne, eczema or change in skin lesion Neuro: No weakness, tremor, incoordination, spasms, paresthesia or pain Psychiatric: Denies confusion, memory loss or sensory loss. Denies Depression. Endocrine: Denies change in weight, skin, hair change, nocturia, and paresthesia, diabetic polys, visual blurring or hyper / hypo glycemic episodes.  Heme/Lymph: No excessive bleeding, bruising or enlarged lymph nodes.  Physical Exam  BP 136/82 mmHg  Pulse 64  Temp(Src) 97.3 F (36.3 C)  Resp 16  Ht 5' 10.5" (1.791 m)  Wt 212 lb 9.6 oz (96.435 kg)  BMI 30.06 kg/m2  General Appearance: Well nourished &  in no apparent distress. Eyes: PERRLA, EOMs, conjunctiva no swelling or erythema, normal fundi and vessels. Sinuses: No frontal/maxillary tenderness ENT/Mouth: EACs patent / TMs  nl. Nares clear without erythema, swelling, mucoid exudates. Oral hygiene is good. No erythema, swelling, or exudate. Tongue normal, non-obstructing. Tonsils not swollen or erythematous. Hearing normal.  Neck: Supple, thyroid normal. No bruits, nodes or JVD. Respiratory: Respiratory effort normal.  BS equal and clear bilateral without rales, rhonci, wheezing or stridor. Cardio: Heart sounds are normal with regular rate and rhythm and no murmurs, rubs or gallops. Peripheral pulses are normal and equal bilaterally without edema. No aortic or femoral bruits. Chest: symmetric with normal excursions and percussion.  Abdomen: Flat, soft, with bowel sounds. Nontender, no guarding, rebound, hernias, masses, or organomegaly.  Lymphatics: Non tender without lymphadenopathy.  Genitourinary: No hernias.Testes nl. DRE - prostate nl for age - smooth & firm w/o nodules. Musculoskeletal: Full ROM all peripheral extremities, joint stability, 5/5 strength, and normal gait. Skin: Warm and dry without rashes, lesions, cyanosis, clubbing or  ecchymosis.  Neuro:  Cranial nerves intact, reflexes equal bilaterally. Normal muscle tone, no cerebellar symptoms. Sensation intact.  Pysch: Alert and oriented X 3 with normal affect, insight and judgment appropriate.   Assessment and Plan  1. Essential hypertension  - EKG 12-Lead - [place], RETROPERITNL ABD,  LTD - TSH  2. Hyperlipidemia  - Lipid panel  3. 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 Vitamin D deficiency  - Vit D  25 hydroxy   5. Testosterone Deficiency  - Testosterone  6. Obesity   7. Screening for rectal cancer  - POC Hemoccult Bld  8. Prostate cancer screening  - PSA  9. Other fatigue  - Vitamin B12 - Iron and TIBC - TSH  10. BMI 29.0-29.9,adult   11. Screening examination for pulmonary tuberculosis  - PPD  12. Medication management  - Urine Microscopic - CBC with Differential/Platelet - BASIC METABOLIC PANEL WITH GFR - Hepatic function panel - Magnesium   Continue prudent diet as discussed, weight control, BP monitoring, regular exercise, and medications as discussed.  Discussed med effects and SE's. Routine screening labs and tests as requested with regular follow-up as recommended.  Over 40 minutes of exam, counseling &  chart review was performed

## 2015-07-19 ENCOUNTER — Ambulatory Visit (HOSPITAL_COMMUNITY)
Admission: RE | Admit: 2015-07-19 | Discharge: 2015-07-19 | Disposition: A | Discharge status: Home / Self Care | Payer: BLUE CROSS/BLUE SHIELD | Source: Ambulatory Visit | Attending: Internal Medicine | Admitting: Internal Medicine

## 2015-07-19 ENCOUNTER — Other Ambulatory Visit: Payer: Self-pay | Admitting: Internal Medicine

## 2015-07-19 DIAGNOSIS — R079 Chest pain, unspecified: Secondary | ICD-10-CM | POA: Insufficient documentation

## 2015-07-19 DIAGNOSIS — R9389 Abnormal findings on diagnostic imaging of other specified body structures: Secondary | ICD-10-CM

## 2015-07-19 LAB — INSULIN, RANDOM: INSULIN: 16.9 u[IU]/mL (ref 2.0–19.6)

## 2015-07-19 LAB — MICROALBUMIN / CREATININE URINE RATIO
Creatinine, Urine: 126.9 mg/dL
MICROALB UR: 0.5 mg/dL (ref <2.0)
Microalb Creat Ratio: 3.9 mg/g (ref 0.0–30.0)

## 2015-07-19 LAB — URINALYSIS, MICROSCOPIC ONLY
BACTERIA UA: NONE SEEN [HPF]
CRYSTALS: NONE SEEN [HPF]
Casts: NONE SEEN [LPF]
RBC / HPF: NONE SEEN RBC/HPF (ref <=2)
Squamous Epithelial / LPF: NONE SEEN [HPF] (ref <=5)
WBC UA: NONE SEEN WBC/HPF (ref <=5)
Yeast: NONE SEEN [HPF]

## 2015-07-19 LAB — TSH: TSH: 1.752 u[IU]/mL (ref 0.350–4.500)

## 2015-07-19 LAB — TESTOSTERONE: Testosterone: 244 ng/dL — ABNORMAL LOW (ref 300–890)

## 2015-07-19 LAB — PSA: PSA: 0.69 ng/mL (ref <=4.00)

## 2015-07-19 LAB — VITAMIN B12: Vitamin B-12: 287 pg/mL (ref 211–911)

## 2015-07-19 LAB — VITAMIN D 25 HYDROXY (VIT D DEFICIENCY, FRACTURES): VIT D 25 HYDROXY: 62 ng/mL (ref 30–100)

## 2015-07-20 ENCOUNTER — Ambulatory Visit (HOSPITAL_COMMUNITY)
Admission: RE | Admit: 2015-07-20 | Discharge: 2015-07-20 | Disposition: A | Discharge status: Home / Self Care | Payer: BLUE CROSS/BLUE SHIELD | Source: Ambulatory Visit | Attending: Internal Medicine | Admitting: Internal Medicine

## 2015-07-20 ENCOUNTER — Telehealth: Payer: Self-pay | Admitting: *Deleted

## 2015-07-20 DIAGNOSIS — R938 Abnormal findings on diagnostic imaging of other specified body structures: Secondary | ICD-10-CM | POA: Diagnosis not present

## 2015-07-20 DIAGNOSIS — R9389 Abnormal findings on diagnostic imaging of other specified body structures: Secondary | ICD-10-CM

## 2015-07-20 NOTE — Telephone Encounter (Signed)
[facility] notified of Testosterone Cypionate approval.  Called to [phone].

## 2015-11-01 ENCOUNTER — Ambulatory Visit (INDEPENDENT_AMBULATORY_CARE_PROVIDER_SITE_OTHER): Payer: BLUE CROSS/BLUE SHIELD | Admitting: Internal Medicine

## 2015-11-01 ENCOUNTER — Encounter: Payer: Self-pay | Admitting: Internal Medicine

## 2015-11-01 VITALS — BP 116/62 | HR 96 | Temp 98.2°F | Resp 18 | Ht 70.5 in | Wt 215.0 lb

## 2015-11-01 DIAGNOSIS — N182 Chronic kidney disease, stage 2 (mild): Secondary | ICD-10-CM

## 2015-11-01 DIAGNOSIS — I10 Essential (primary) hypertension: Secondary | ICD-10-CM

## 2015-11-01 DIAGNOSIS — E785 Hyperlipidemia, unspecified: Secondary | ICD-10-CM | POA: Diagnosis not present

## 2015-11-01 DIAGNOSIS — IMO0002 Reserved for concepts with insufficient information to code with codable children: Secondary | ICD-10-CM

## 2015-11-01 DIAGNOSIS — E1122 Type 2 diabetes mellitus with diabetic chronic kidney disease: Secondary | ICD-10-CM

## 2015-11-01 DIAGNOSIS — E559 Vitamin D deficiency, unspecified: Secondary | ICD-10-CM

## 2015-11-01 DIAGNOSIS — E291 Testicular hypofunction: Secondary | ICD-10-CM

## 2015-11-01 DIAGNOSIS — Z79899 Other long term (current) drug therapy: Secondary | ICD-10-CM

## 2015-11-01 DIAGNOSIS — E1165 Type 2 diabetes mellitus with hyperglycemia: Secondary | ICD-10-CM | POA: Diagnosis not present

## 2015-11-01 LAB — CBC WITH DIFFERENTIAL/PLATELET
BASOS ABS: 0 10*3/uL (ref 0.0–0.1)
Basophils Relative: 0 % (ref 0–1)
EOS ABS: 0.1 10*3/uL (ref 0.0–0.7)
Eosinophils Relative: 2 % (ref 0–5)
HEMATOCRIT: 47.1 % (ref 39.0–52.0)
HEMOGLOBIN: 16.6 g/dL (ref 13.0–17.0)
LYMPHS ABS: 2.3 10*3/uL (ref 0.7–4.0)
LYMPHS PCT: 33 % (ref 12–46)
MCH: 32.5 pg (ref 26.0–34.0)
MCHC: 35.2 g/dL (ref 30.0–36.0)
MCV: 92.2 fL (ref 78.0–100.0)
MPV: 9.3 fL (ref 8.6–12.4)
Monocytes Absolute: 0.6 10*3/uL (ref 0.1–1.0)
Monocytes Relative: 9 % (ref 3–12)
NEUTROS ABS: 3.9 10*3/uL (ref 1.7–7.7)
NEUTROS PCT: 56 % (ref 43–77)
PLATELETS: 182 10*3/uL (ref 150–400)
RBC: 5.11 MIL/uL (ref 4.22–5.81)
RDW: 13.3 % (ref 11.5–15.5)
WBC: 6.9 10*3/uL (ref 4.0–10.5)

## 2015-11-01 LAB — BASIC METABOLIC PANEL WITH GFR
BUN: 11 mg/dL (ref 7–25)
CALCIUM: 9.5 mg/dL (ref 8.6–10.3)
CHLORIDE: 100 mmol/L (ref 98–110)
CO2: 28 mmol/L (ref 20–31)
Creat: 0.99 mg/dL (ref 0.70–1.33)
GFR, Est Non African American: 83 mL/min (ref >=60)
GLUCOSE: 155 mg/dL — AB (ref 65–99)
POTASSIUM: 4.2 mmol/L (ref 3.5–5.3)
Sodium: 137 mmol/L (ref 135–146)

## 2015-11-01 LAB — HEPATIC FUNCTION PANEL
ALBUMIN: 4.6 g/dL (ref 3.6–5.1)
ALT: 33 U/L (ref 9–46)
AST: 25 U/L (ref 10–35)
Alkaline Phosphatase: 55 U/L (ref 40–115)
Bilirubin, Direct: 0.1 mg/dL (ref <=0.2)
Indirect Bilirubin: 0.5 mg/dL (ref 0.2–1.2)
TOTAL PROTEIN: 7.1 g/dL (ref 6.1–8.1)
Total Bilirubin: 0.6 mg/dL (ref 0.2–1.2)

## 2015-11-01 LAB — LIPID PANEL
CHOLESTEROL: 168 mg/dL (ref 125–200)
HDL: 35 mg/dL — ABNORMAL LOW (ref >=40)
LDL Cholesterol: 106 mg/dL (ref <130)
Total CHOL/HDL Ratio: 4.8 Ratio (ref <=5.0)
Triglycerides: 133 mg/dL (ref <150)
VLDL: 27 mg/dL (ref <30)

## 2015-11-01 LAB — TSH: TSH: 1.049 u[IU]/mL (ref 0.350–4.500)

## 2015-11-01 MED ORDER — TESTOSTERONE CYPIONATE 200 MG/ML IM SOLN: INTRAMUSCULAR | Status: DC

## 2015-11-01 NOTE — Progress Notes (Signed)
Patient ID: Zachary Reid, male   DOB: Jan 06, 1956, 59 y.o.   MRN: [id number]  Assessment and Plan:  Hypertension:  -Continue medication -monitor blood pressure at home. -Continue DASH diet -Reminder to go to the ER if any CP, SOB, nausea, dizziness, severe HA, changes vision/speech, left arm numbness and tingling and jaw pain.  Cholesterol - Continue diet and exercise -Check cholesterol.   Diabetes with diabetic chronic kidney disease  -not tolerating metformin well -try [place] samples -Continue diet and exercise.  -Check A1C  Vitamin D Def -check level -continue medications.   Continue diet and meds as discussed. Further disposition pending results of labs. Discussed med's effects and SE's.    HPI 59 y.o. male  presents for 3 month follow up with hypertension, hyperlipidemia, diabetes and vitamin D deficiency.   His blood pressure has been controlled at home, today their BP is BP: 116/62 mmHg.He does workout. He denies chest pain, shortness of breath, dizziness.   He is on cholesterol medication and denies myalgias. His cholesterol is at goal. The cholesterol was:  07/18/2015: Cholesterol 156; HDL 35*; LDL Cholesterol 85; Triglycerides 179*   He has been working on diet and exercise for diabetes with diabetic chronic kidney disease, he is on bASA, he is on ACE/ARB, and denies  foot ulcerations, hyperglycemia, hypoglycemia , increased appetite, nausea, paresthesia of the feet, polydipsia, polyuria, visual disturbances, vomiting and weight loss. Last A1C was: 07/18/2015: Hgb A1c MFr Bld 7.1*.  He reports that he only eats two meals a day.  He reports that    Patient is on Vitamin D supplement. 07/18/2015: Vit D, 25-Hydroxy 62  He reports that the testosterone is helping a lot.  He reports that he is doing his own injections and seems to be doing well.  He also reports that he is doing vit B and that helps too.     Current Medications:  Current Outpatient Prescriptions on File  Prior to Visit  Medication Sig Dispense Refill  . aspirin 81 MG tablet Take 81 mg by mouth 2 (two) times daily.     . Cholecalciferol (VITAMIN D PO) Take 5,000 Units by mouth daily.    . hyoscyamine (LEVSIN SL) 0.125 MG SL tablet DISSOLVE ONE TO TWO TABLETS UNDER THE TONGUE 4 TIMES DAILY UP TO EVERY 4 HOURS AS NEEDED FOR NAUSEA, BLOATING, CRAMPING, OR DIARRHEA 100 tablet 0  . metFORMIN (GLUCOPHAGE-XR) 500 MG 24 hr tablet TAKE ONE TABLET BY MOUTH THREE TIMES DAILY WITH FOOD FOR  DIABETES 90 tablet 5  . OVER THE COUNTER MEDICATION CO Q10 1 cap daily    . simvastatin (ZOCOR) 80 MG tablet Take 1 tablet (80 mg total) by mouth daily. 90 tablet 1   No current facility-administered medications on file prior to visit.   Medical History:  Past Medical History  Diagnosis Date  . Abnormal EKG   . DM (diabetes mellitus) ([place])   . Hypertension   . Hyperlipidemia   . Hypogonadism male   . Vitamin D deficiency   . IBS (irritable bowel syndrome)   . Obesity     BMI 31   Allergies:  Allergies  Allergen Reactions  . Lipitor [Atorvastatin]     fatigue     Review of Systems:  Review of Systems  Constitutional: Negative for fever, chills and malaise/fatigue.  HENT: Negative for congestion, ear pain and sore throat.   Respiratory: Negative for cough, shortness of breath and wheezing.   Cardiovascular: Negative for chest pain, palpitations and  leg swelling.  Gastrointestinal: Negative for heartburn, abdominal pain, diarrhea, constipation, blood in stool and melena.  Genitourinary: Negative.   Skin: Negative.   Neurological: Negative for dizziness, sensory change, loss of consciousness and headaches.  Psychiatric/Behavioral: Negative for depression. The patient is not nervous/anxious and does not have insomnia.     Family history- Review and unchanged  Social history- Review and unchanged  Physical Exam: BP 116/62 mmHg  Pulse 96  Temp(Src) 98.2 F (36.8 C) (Temporal)  Resp 18  Ht 5'  10.5" (1.791 m)  Wt 215 lb (97.523 kg)  BMI 30.40 kg/m2 Wt Readings from Last 3 Encounters:  11/01/15 215 lb (97.523 kg)  07/18/15 212 lb 9.6 oz (96.435 kg)  04/06/15 216 lb (97.977 kg)   General Appearance: Well nourished well developed, non-toxic appearing, in no apparent distress. Eyes: PERRLA, EOMs, conjunctiva no swelling or erythema ENT/Mouth: Ear canals clear with no erythema, swelling, or discharge.  TMs normal bilaterally, oropharynx clear, moist, with no exudate.   Neck: Supple, thyroid normal, no JVD, no cervical adenopathy.  Respiratory: Respiratory effort normal, breath sounds clear A&P, no wheeze, rhonchi or rales noted.  No retractions, no accessory muscle usage Cardio: RRR with no MRGs. No noted edema.  Abdomen: Soft, + BS.  Non tender, no guarding, rebound, hernias, masses. Musculoskeletal: Full ROM, 5/5 strength, Normal gait Skin: Warm, dry without rashes, lesions, ecchymosis.  Neuro: Awake and oriented X 3, Cranial nerves intact. No cerebellar symptoms.  Psych: normal affect, Insight and Judgment appropriate.    Starlyn Skeans, PA-C 10:11 AM [facility]

## 2015-11-02 LAB — HEMOGLOBIN A1C
Hgb A1c MFr Bld: 7.8 % — ABNORMAL HIGH (ref <5.7)
MEAN PLASMA GLUCOSE: 177 mg/dL — AB (ref <117)

## 2015-11-09 ENCOUNTER — Other Ambulatory Visit: Payer: Self-pay | Admitting: Internal Medicine

## 2015-11-09 MED ORDER — SITAGLIPTIN PHOSPHATE 100 MG PO TABS: 100.0000 mg | ORAL_TABLET | Freq: Every day | ORAL | Status: DC

## 2016-02-02 ENCOUNTER — Encounter: Payer: Self-pay | Admitting: Internal Medicine

## 2016-02-02 ENCOUNTER — Other Ambulatory Visit: Payer: Self-pay | Admitting: *Deleted

## 2016-02-02 ENCOUNTER — Other Ambulatory Visit: Payer: Self-pay | Admitting: Internal Medicine

## 2016-02-02 ENCOUNTER — Ambulatory Visit (INDEPENDENT_AMBULATORY_CARE_PROVIDER_SITE_OTHER): Payer: BLUE CROSS/BLUE SHIELD | Admitting: Internal Medicine

## 2016-02-02 VITALS — BP 114/80 | HR 68 | Temp 97.5°F | Resp 16 | Ht 70.5 in | Wt 220.0 lb

## 2016-02-02 DIAGNOSIS — E119 Type 2 diabetes mellitus without complications: Secondary | ICD-10-CM | POA: Insufficient documentation

## 2016-02-02 DIAGNOSIS — N182 Chronic kidney disease, stage 2 (mild): Secondary | ICD-10-CM

## 2016-02-02 DIAGNOSIS — E1122 Type 2 diabetes mellitus with diabetic chronic kidney disease: Secondary | ICD-10-CM | POA: Diagnosis not present

## 2016-02-02 DIAGNOSIS — I10 Essential (primary) hypertension: Secondary | ICD-10-CM | POA: Diagnosis not present

## 2016-02-02 DIAGNOSIS — E559 Vitamin D deficiency, unspecified: Secondary | ICD-10-CM

## 2016-02-02 DIAGNOSIS — E1129 Type 2 diabetes mellitus with other diabetic kidney complication: Secondary | ICD-10-CM | POA: Insufficient documentation

## 2016-02-02 DIAGNOSIS — Z79899 Other long term (current) drug therapy: Secondary | ICD-10-CM | POA: Diagnosis not present

## 2016-02-02 DIAGNOSIS — E291 Testicular hypofunction: Secondary | ICD-10-CM | POA: Diagnosis not present

## 2016-02-02 DIAGNOSIS — E785 Hyperlipidemia, unspecified: Secondary | ICD-10-CM | POA: Diagnosis not present

## 2016-02-02 DIAGNOSIS — IMO0002 Reserved for concepts with insufficient information to code with codable children: Secondary | ICD-10-CM | POA: Insufficient documentation

## 2016-02-02 LAB — CBC WITH DIFFERENTIAL/PLATELET
BASOS PCT: 0 % (ref 0–1)
Basophils Absolute: 0 10*3/uL (ref 0.0–0.1)
Eosinophils Absolute: 0.1 10*3/uL (ref 0.0–0.7)
Eosinophils Relative: 2 % (ref 0–5)
HCT: 46.8 % (ref 39.0–52.0)
HEMOGLOBIN: 16.2 g/dL (ref 13.0–17.0)
LYMPHS PCT: 32 % (ref 12–46)
Lymphs Abs: 2.1 10*3/uL (ref 0.7–4.0)
MCH: 32 pg (ref 26.0–34.0)
MCHC: 34.6 g/dL (ref 30.0–36.0)
MCV: 92.5 fL (ref 78.0–100.0)
MONOS PCT: 8 % (ref 3–12)
MPV: 9.7 fL (ref 8.6–12.4)
Monocytes Absolute: 0.5 10*3/uL (ref 0.1–1.0)
NEUTROS ABS: 3.8 10*3/uL (ref 1.7–7.7)
NEUTROS PCT: 58 % (ref 43–77)
Platelets: 176 10*3/uL (ref 150–400)
RBC: 5.06 MIL/uL (ref 4.22–5.81)
RDW: 12.9 % (ref 11.5–15.5)
WBC: 6.6 10*3/uL (ref 4.0–10.5)

## 2016-02-02 LAB — LIPID PANEL
CHOL/HDL RATIO: 4.1 ratio (ref <=5.0)
Cholesterol: 168 mg/dL (ref 125–200)
HDL: 41 mg/dL (ref >=40)
LDL Cholesterol: 95 mg/dL (ref <130)
Triglycerides: 159 mg/dL — ABNORMAL HIGH (ref <150)
VLDL: 32 mg/dL — AB (ref <30)

## 2016-02-02 LAB — BASIC METABOLIC PANEL WITH GFR
BUN: 18 mg/dL (ref 7–25)
CHLORIDE: 101 mmol/L (ref 98–110)
CO2: 25 mmol/L (ref 20–31)
Calcium: 9.6 mg/dL (ref 8.6–10.3)
Creat: 0.95 mg/dL (ref 0.70–1.33)
GFR, EST NON AFRICAN AMERICAN: 87 mL/min (ref >=60)
GFR, Est African American: >89 mL/min (ref >=60)
Glucose, Bld: 210 mg/dL — ABNORMAL HIGH (ref 65–99)
Potassium: 4.4 mmol/L (ref 3.5–5.3)
SODIUM: 137 mmol/L (ref 135–146)

## 2016-02-02 LAB — HEPATIC FUNCTION PANEL
ALK PHOS: 60 U/L (ref 40–115)
ALT: 24 U/L (ref 9–46)
AST: 18 U/L (ref 10–35)
Albumin: 4.7 g/dL (ref 3.6–5.1)
BILIRUBIN DIRECT: 0.1 mg/dL (ref <=0.2)
BILIRUBIN INDIRECT: 0.5 mg/dL (ref 0.2–1.2)
Total Bilirubin: 0.6 mg/dL (ref 0.2–1.2)
Total Protein: 7.5 g/dL (ref 6.1–8.1)

## 2016-02-02 LAB — TESTOSTERONE: TESTOSTERONE: 235 ng/dL — AB (ref 250–827)

## 2016-02-02 LAB — MAGNESIUM: Magnesium: 2.1 mg/dL (ref 1.5–2.5)

## 2016-02-02 LAB — HEMOGLOBIN A1C
HEMOGLOBIN A1C: 8.1 % — AB (ref <5.7)
Mean Plasma Glucose: 186 mg/dL — ABNORMAL HIGH (ref <117)

## 2016-02-02 LAB — TSH: TSH: 1.59 m[IU]/L (ref 0.40–4.50)

## 2016-02-02 MED ORDER — TESTOSTERONE CYPIONATE 200 MG/ML IM SOLN: INTRAMUSCULAR | Status: DC

## 2016-02-02 MED ORDER — METFORMIN HCL ER 500 MG PO TB24: ORAL_TABLET | ORAL | Status: DC

## 2016-02-02 NOTE — Progress Notes (Signed)
Patient ID: Zachary Reid, male   DOB: 24-Aug-1956, 60 y.o.   MRN: [id number]   This very nice 60 y.o. MWM presents for 3 month follow up with Hypertension, Hyperlipidemia, Pre-Diabetes, Testosterone  and Vitamin D Deficiency.    Patient is monitored expectantly since 2013 for labile HTN & BP has been controlled at home. Today's BP: 114/80 mmHg. Patient has had no complaints of any cardiac type chest pain, palpitations, dyspnea/orthopnea/PND, dizziness, claudication, or dependent edema.   Hyperlipidemia is not controlled with diet & meds. Patient denies myalgias or other med SE's. Last Lipids were near , but not at goal with  Cholesterol 168; HDL 35*; LDL 106;  And Triglycerides 133 on 11/01/2015.   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 He reports CGG's range mostly betw 148-160 mg%. Control has been sub-optimal due to poor dietary compliance and last A1c was not at goal with 7.8% on 11/01/2015.   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 Testosterone was low at 231 in July 2014. Further, the patient also has history of Vitamin D Deficiency of "44" on treatment in 2013  and supplements vitamin D without any suspected side-effects. Last vitamin D was 62 on 8/30/201.  Medication Sig  . aspirin 81 MG tablet Take  2  times daily.   Marland Kitchen VITAMIN D  Take 5,000 Units  daily.  . hyoscyamine SL 0.125 MG SL tablet 1-2 tab sl qid/q4h prn  . metFORMIN-XR 500 MG 24 hr tablet TAKE ONE TAB 3 TIMES DAILY   . CO Q10  1 cap daily  . simvastatin  80 MG tablet Take 1 tab daily.  Marland Kitchen testosterone cypionate  200 MG/ML injec 2 ml into muscle every 2 weeks or as directed   Allergies  Allergen Reactions  . Lipitor [Atorvastatin]     fatigue   PMHx:   Past Medical History  Diagnosis Date  . Abnormal EKG   . DM (diabetes mellitus) ([place])   . Hypertension   .  Hyperlipidemia   . Hypogonadism male   . Vitamin D deficiency   . IBS (irritable bowel syndrome)   . Obesity     BMI 31   Immunization History  Administered Date(s) Administered  . Influenza-Unspecified 09/02/2015  . PPD Test 05/24/2014, 07/18/2015  . Pneumococcal-Unspecified 05/17/2013  . Tdap 05/17/2013   Past Surgical History  Procedure Laterality Date  . Appendectomy    . Joint replacement     FHx:    Reviewed / unchanged  SHx:    Reviewed / unchanged  Systems Review:  Constitutional: Denies fever, chills, wt changes, headaches, insomnia, fatigue, night sweats, change in appetite. Eyes: Denies redness, blurred vision, diplopia, discharge, itchy, watery eyes.  ENT: Denies discharge, congestion, post nasal drip, epistaxis, sore throat, earache, hearing loss, dental pain, tinnitus, vertigo, sinus pain, snoring.  CV: Denies chest pain, palpitations, irregular heartbeat, syncope, dyspnea, diaphoresis, orthopnea, PND, claudication or edema. Respiratory: denies cough, dyspnea, DOE, pleurisy, hoarseness, laryngitis, wheezing.  Gastrointestinal: Denies dysphagia, odynophagia, heartburn, reflux, water brash, abdominal pain or cramps, nausea, vomiting, bloating, diarrhea, constipation, hematemesis, melena, hematochezia  or hemorrhoids. Genitourinary: Denies dysuria, frequency, urgency, nocturia, hesitancy, discharge, hematuria or flank pain. Musculoskeletal: Denies arthralgias, myalgias, stiffness, jt. swelling, pain, limping or strain/sprain.  Skin: Denies pruritus, rash, hives, warts, acne, eczema or change in skin lesion(s). Neuro: No weakness, tremor, incoordination, spasms, paresthesia  or pain. Psychiatric: Denies confusion, memory loss or sensory loss. Endo: Denies change in weight, skin or hair change.  Heme/Lymph: No excessive bleeding, bruising or enlarged lymph nodes.  Physical Exam  BP 114/80 mmHg  Pulse 68  Temp(Src) 97.5 F (36.4 C)  Resp 16  Ht 5' 10.5" (1.791 m)   Wt 220 lb (99.791 kg)  BMI 31.11 kg/m2  Appears well nourished and in no distress. Eyes: PERRLA, EOMs, conjunctiva no swelling or erythema. Sinuses: No frontal/maxillary tenderness ENT/Mouth: EAC's clear, TM's nl w/o erythema, bulging. Nares clear w/o erythema, swelling, exudates. Oropharynx clear without erythema or exudates. Oral hygiene is good. Tongue normal, non obstructing. Hearing intact.  Neck: Supple. Thyroid nl. Car 2+/2+ without bruits, nodes or JVD. Chest: Respirations nl with BS clear & equal w/o rales, rhonchi, wheezing or stridor.  Cor: Heart sounds normal w/ regular rate and rhythm without sig. murmurs, gallops, clicks, or rubs. Peripheral pulses normal and equal  without edema.  Abdomen: Soft & bowel sounds normal. Non-tender w/o guarding, rebound, hernias, masses, or organomegaly.  Lymphatics: Unremarkable.  Musculoskeletal: Full ROM all peripheral extremities, joint stability, 5/5 strength, and normal gait.  Skin: Warm, dry without exposed rashes, lesions or ecchymosis apparent.  Neuro: Cranial nerves intact, reflexes equal bilaterally. Sensory-motor testing grossly intact. Tendon reflexes grossly intact.  Pysch: Alert & oriented x 3.  Insight and judgement nl & appropriate. No ideations.  Assessment and Plan:  1. Essential hypertension  - TSH  2. Hyperlipidemia  - Lipid panel - TSH  3. 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 Vitamin D deficiency  - VITAMIN D 25 Hydroxy   5. Testosterone Deficiency  - Testosterone  6. Medication management  - CBC with Differential/Platelet - BASIC METABOLIC PANEL WITH GFR - Hepatic function panel - Magnesium   Recommended regular exercise, BP monitoring, weight control, and discussed med and SE's. Recommended labs to assess and monitor clinical status. Further disposition pending results of labs. Over 30 minutes of exam, counseling, chart review was performed

## 2016-02-02 NOTE — Patient Instructions (Signed)

## 2016-02-03 LAB — VITAMIN D 25 HYDROXY (VIT D DEFICIENCY, FRACTURES): Vit D, 25-Hydroxy: 57 ng/mL (ref 30–100)

## 2016-02-05 LAB — INSULIN, RANDOM: Insulin: 20.3 u[IU]/mL — ABNORMAL HIGH (ref 2.0–19.6)

## 2016-05-10 ENCOUNTER — Encounter: Payer: Self-pay | Admitting: Physician Assistant

## 2016-05-10 ENCOUNTER — Ambulatory Visit (INDEPENDENT_AMBULATORY_CARE_PROVIDER_SITE_OTHER): Payer: BLUE CROSS/BLUE SHIELD | Admitting: Physician Assistant

## 2016-05-10 VITALS — BP 126/80 | HR 72 | Temp 98.0°F | Resp 16 | Ht 70.5 in | Wt 217.0 lb

## 2016-05-10 DIAGNOSIS — Z79899 Other long term (current) drug therapy: Secondary | ICD-10-CM | POA: Diagnosis not present

## 2016-05-10 DIAGNOSIS — E1122 Type 2 diabetes mellitus with diabetic chronic kidney disease: Secondary | ICD-10-CM

## 2016-05-10 DIAGNOSIS — I10 Essential (primary) hypertension: Secondary | ICD-10-CM | POA: Diagnosis not present

## 2016-05-10 DIAGNOSIS — E785 Hyperlipidemia, unspecified: Secondary | ICD-10-CM | POA: Diagnosis not present

## 2016-05-10 DIAGNOSIS — N182 Chronic kidney disease, stage 2 (mild): Secondary | ICD-10-CM | POA: Diagnosis not present

## 2016-05-10 DIAGNOSIS — E559 Vitamin D deficiency, unspecified: Secondary | ICD-10-CM

## 2016-05-10 DIAGNOSIS — E669 Obesity, unspecified: Secondary | ICD-10-CM | POA: Diagnosis not present

## 2016-05-10 LAB — CBC WITH DIFFERENTIAL/PLATELET
BASOS ABS: 0 {cells}/uL (ref 0–200)
Basophils Relative: 0 %
EOS PCT: 2 %
Eosinophils Absolute: 114 cells/uL (ref 15–500)
HEMATOCRIT: 45.9 % (ref 38.5–50.0)
HEMOGLOBIN: 15.6 g/dL (ref 13.2–17.1)
LYMPHS ABS: 1995 {cells}/uL (ref 850–3900)
Lymphocytes Relative: 35 %
MCH: 32.4 pg (ref 27.0–33.0)
MCHC: 34 g/dL (ref 32.0–36.0)
MCV: 95.4 fL (ref 80.0–100.0)
MONO ABS: 456 {cells}/uL (ref 200–950)
MPV: 9.3 fL (ref 7.5–12.5)
Monocytes Relative: 8 %
NEUTROS PCT: 55 %
Neutro Abs: 3135 cells/uL (ref 1500–7800)
Platelets: 171 10*3/uL (ref 140–400)
RBC: 4.81 MIL/uL (ref 4.20–5.80)
RDW: 12.8 % (ref 11.0–15.0)
WBC: 5.7 10*3/uL (ref 3.8–10.8)

## 2016-05-10 LAB — BASIC METABOLIC PANEL WITH GFR
BUN: 12 mg/dL (ref 7–25)
CALCIUM: 9.4 mg/dL (ref 8.6–10.3)
CO2: 23 mmol/L (ref 20–31)
CREATININE: 0.88 mg/dL (ref 0.70–1.33)
Chloride: 106 mmol/L (ref 98–110)
GFR, Est African American: >89 mL/min (ref >=60)
GFR, Est Non African American: >89 mL/min (ref >=60)
GLUCOSE: 150 mg/dL — AB (ref 65–99)
Potassium: 4.7 mmol/L (ref 3.5–5.3)
Sodium: 142 mmol/L (ref 135–146)

## 2016-05-10 LAB — HEPATIC FUNCTION PANEL
ALT: 26 U/L (ref 9–46)
AST: 20 U/L (ref 10–35)
Albumin: 4.3 g/dL (ref 3.6–5.1)
Alkaline Phosphatase: 54 U/L (ref 40–115)
Bilirubin, Direct: 0.1 mg/dL (ref <=0.2)
Indirect Bilirubin: 0.6 mg/dL (ref 0.2–1.2)
TOTAL PROTEIN: 7 g/dL (ref 6.1–8.1)
Total Bilirubin: 0.7 mg/dL (ref 0.2–1.2)

## 2016-05-10 LAB — LIPID PANEL
CHOLESTEROL: 144 mg/dL (ref 125–200)
HDL: 47 mg/dL (ref >=40)
LDL Cholesterol: 75 mg/dL (ref <130)
Total CHOL/HDL Ratio: 3.1 Ratio (ref <=5.0)
Triglycerides: 109 mg/dL (ref <150)
VLDL: 22 mg/dL (ref <30)

## 2016-05-10 LAB — MAGNESIUM: MAGNESIUM: 2.1 mg/dL (ref 1.5–2.5)

## 2016-05-10 LAB — TSH: TSH: 1.52 m[IU]/L (ref 0.40–4.50)

## 2016-05-10 NOTE — Patient Instructions (Addendum)
Diabetes is a very complicated disease...lets simplify it.  An easy way to look at it to understand the complications is if you think of the extra sugar floating in your blood stream as glass shards floating through your blood stream.    Diabetes affects your small vessels first: 1) The glass shards (sugar) scraps down the tiny blood vessels in your eyes and lead to diabetic retinopathy, the leading cause of blindness in the [place]. Diabetes is the leading cause of newly diagnosed adult (25 to 60 years of age) blindness in the [place].  2) The glass shards scratches down the tiny vessels of your legs leading to nerve damage called neuropathy and can lead to amputations of your feet. More than 60% of all non-traumatic amputations of lower limbs occur in people with diabetes.  3) Over time the small vessels in your brain are shredded and closed off, individually this does not cause any problems but over a long period of time many of the small vessels being blocked can lead to Vascular Dementia.   4) Your kidney's are a filter system and have a "net" that keeps certain things in the body and lets bad things out. Sugar shreds this net and leads to kidney damage and eventually failure. Decreasing the sugar that is destroying the net and certain blood pressure medications can help stop or decrease progression of kidney disease. Diabetes was the primary cause of kidney failure in 44 percent of all new cases in 2011.  5) Diabetes also destroys the small vessels in your penis that lead to erectile dysfunction. Eventually the vessels are so damaged that you may not be responsive to cialis or viagra.   Diabetes and your large vessels: Your larger vessels consist of your coronary arteries in your heart and the carotid vessels to your brain. Diabetes or even increased sugars put you at 300% increased risk of heart attack and stroke and this is why.. The sugar scrapes down your large blood vessels and your body  sees this as an internal injury and tries to repair itself. Just like you get a scab on your skin, your platelets will stick to the blood vessel wall trying to heal it. This is why we have diabetics on low dose aspirin daily, this prevents the platelets from sticking and can prevent plaque formation. In addition, your body takes cholesterol and tries to shove it into the open wound. This is why we want your LDL, or bad cholesterol, below 70.   The combination of platelets and cholesterol over 5-10 years forms plaque that can break off and cause a heart attack or stroke.   PLEASE REMEMBER:  Diabetes is preventable! Up to 6 percent of complications and morbidities among individuals with type 2 diabetes can be prevented, delayed, or effectively treated and minimized with regular visits to a health professional, appropriate monitoring and medication, and a healthy diet and lifestyle.  Shake of spinach, whey protein, almond milk, strawberries, and avocado oil from food store with zinc.   9 Ways to Naturally Increase Testosterone Levels  1.   Lose Weight If you're overweight, shedding the excess pounds may increase your testosterone levels, according to research presented at the Endocrine Society's 2012 meeting. Overweight men are more likely to have low testosterone levels to begin with, so this is an important trick to increase your body's testosterone production when you need it most.  2.   High-Intensity Exercise like [employer]  Short intense exercise has a proven positive  effect on increasing testosterone levels and preventing its decline. That's unlike aerobics or prolonged moderate exercise, which have shown to have negative or no effect on testosterone levels. Having a whey protein meal after exercise can further enhance the satiety/testosterone-boosting impact (hunger hormones cause the opposite effect on your testosterone and libido). Here's a summary of what a typical high-intensity [employer]  [employer]itness routine might look like: " Warm up for three minutes  " Exercise as hard and fast as you can for 30 seconds. You should feel like you couldn't possibly go on another few seconds  " Recover at a slow to moderate pace for 90 seconds  " Repeat the high intensity exercise and recovery 7 more times .  3.   Consume Plenty of Zinc The mineral zinc is important for testosterone production, and supplementing your diet for as little as six weeks has been shown to cause a marked improvement in testosterone among men with low levels.1 Likewise, research has shown that restricting dietary sources of zinc leads to a significant decrease in testosterone, while zinc supplementation increases it2 -- and even protects men from exercised-induced reductions in testosterone levels.3 It's estimated that up to 12 percent of adults over the age of 60 may have lower than recommended zinc intakes; even when dietary supplements were added in, an estimated 20-25 percent of older adults still had inadequate zinc intakes, according to a [employer] and Nutrition Examination Survey.4 Your diet is the best source of zinc; along with protein-rich foods like meats and fish, other good dietary sources of zinc include raw milk, raw cheese, beans, and yogurt or kefir made from raw milk. It can be difficult to obtain enough dietary zinc if you're a vegetarian, and also for meat-eaters as well, largely because of conventional farming methods that rely heavily on chemical fertilizers and pesticides. These chemicals deplete the soil of nutrients ... nutrients like zinc that must be absorbed by plants in order to be passed on to you. In many cases, you may further deplete the nutrients in your food by the way you prepare it. For most food, cooking it will drastically reduce its levels of nutrients like zinc . particularly over-cooking, which many people do. If you decide to use a zinc supplement, stick to a dosage of less than 40 mg a  day, as this is the recommended adult upper limit. Taking too much zinc can interfere with your body's ability to absorb other minerals, especially copper, and may cause nausea as a side effect.  4.   Strength Training In addition to [employer], strength training is also known to boost testosterone levels, provided you are doing so intensely enough. When strength training to boost testosterone, you'll want to increase the weight and lower your number of reps, and then focus on exercises that work a large number of muscles, such as dead lifts or squats.  You can "turbo-charge" your weight training by going slower. By slowing down your movement, you're actually turning it into a high-intensity exercise. Super Slow movement allows your muscle, at the microscopic level, to access the maximum number of cross-bridges between the protein filaments that produce movement in the muscle.   5.   Optimize Your Vitamin D Levels Vitamin D, a steroid hormone, is essential for the healthy development of the nucleus of the sperm cell, and helps maintain semen quality and sperm count. Vitamin D also increases levels of testosterone, which may boost libido. In one study, overweight men who were given vitamin  D supplements had a significant increase in testosterone levels after one year.5   6.   Reduce Stress When you're under a lot of stress, your body releases high levels of the stress hormone cortisol. This hormone actually blocks the effects of testosterone,6 presumably because, from a biological standpoint, testosterone-associated behaviors (mating, competing, aggression) may have lowered your chances of survival in an emergency (hence, the "fight or flight" response is dominant, courtesy of cortisol).  7.   Limit or Eliminate Sugar from Your Diet Testosterone levels decrease after you eat sugar, which is likely because the sugar leads to a high insulin level, another factor leading to low testosterone.7 Based on  USDA estimates, the average American consumes 12 teaspoons of sugar a day, which equates to about TWO TONS of sugar during a lifetime.  8.   Eat Healthy Fats By healthy, this means not only mon- and polyunsaturated fats, like that found in avocadoes and nuts, but also saturated, as these are essential for building testosterone. Research shows that a diet with less than 40 percent of energy as fat (and that mainly from animal sources, i.e. saturated) lead to a decrease in testosterone levels.8 My personal diet is about 60-70 percent healthy fat, and other experts agree that the ideal diet includes somewhere between 50-70 percent fat.  It's important to understand that your body requires saturated fats from animal and vegetable sources (such as meat, dairy, certain oils, and tropical plants like coconut) for optimal functioning, and if you neglect this important food group in favor of sugar, grains and other starchy carbs, your health and weight are almost guaranteed to suffer. Examples of healthy fats you can eat more of to give your testosterone levels a boost include: Olives and Olive oil  Coconuts and coconut oil Butter made from raw grass-fed organic milk Raw nuts, such as, almonds or pecans Organic pastured egg yolks Avocados Grass-fed meats Palm oil Unheated organic nut oils   9.   Boost Your Intake of Branch Chain Amino Acids (BCAA) from Foods Like [place] suggests that BCAAs result in higher testosterone levels, particularly when taken along with resistance training.9 While BCAAs are available in supplement form, you'll find the highest concentrations of BCAAs like leucine in dairy products - especially quality cheeses and whey protein. Even when getting leucine from your natural food supply, it's often wasted or used as a building block instead of an anabolic agent. So to create the correct anabolic environment, you need to boost leucine consumption way beyond mere maintenance  levels. That said, keep in mind that using leucine as a free form amino acid can be highly counterproductive as when free form amino acids are artificially administrated, they rapidly enter your circulation while disrupting insulin function, and impairing your body's glycemic control. Food-based leucine is really the ideal form that can benefit your muscles without side effects.      Bad carbs also include fruit juice, alcohol, and sweet tea. These are empty calories that do not signal to your brain that you are full.   Please remember the good carbs are still carbs which convert into sugar. So please measure them out no more than 1/2-1 cup of rice, oatmeal, pasta, and beans  Veggies are however free foods! Pile them on.   Not all fruit is created equal. Please see the list below, the fruit at the bottom is higher in sugars than the fruit at the top. Please avoid all dried fruits.

## 2016-05-10 NOTE — Progress Notes (Signed)
Assessment and Plan:  1. Hypertension -Continue medication, monitor blood pressure at home. Continue DASH diet.  Reminder to go to the ER if any CP, SOB, nausea, dizziness, severe HA, changes vision/speech, left arm numbness and tingling and jaw pain.  2. Cholesterol -Continue diet and exercise. Check cholesterol.   3. Diabetes with diabetic chronic kidney disease -Continue diet and exercise. Check A1C  4. Vitamin D Def - check level and continue medications.   5. Obesity with co morbidities-  long discussion about weight loss, diet, and exercise  6. Hypogonadism - continue to monitor, try natural ways to increase testosterone.     Continue diet and meds as discussed. Further disposition pending results of labs. Discussed med's effects and SE's.   Over 30 minutes of exam, counseling, chart review, and critical decision making was performed   HPI 60 y.o. male  presents for 3 month follow up on hypertension, cholesterol, diabetes and vitamin D deficiency.   His blood pressure has been controlled at home, today his BP is BP: 126/80 mmHg.  He does not workout. He denies chest pain, shortness of breath, dizziness.  He is on cholesterol medication, simvastatin 80mg  1/2 pill daily and denies myalgias. His cholesterol is not at goal. The cholesterol was:   Lab Results  Component Value Date   CHOL 168 02/02/2016   HDL 41 02/02/2016   LDLCALC 95 02/02/2016   TRIG 159* 02/02/2016   CHOLHDL 4.1 02/02/2016   He has been working on diet and exercise for diabetes with diabetic chronic kidney disease, he is on bASA, he is not on ACE/ARB, he is on metformin takes 2-3 a day, on probiotic and doing well with metformin, he does not check his sugars at home, and denies  paresthesia of the feet, polydipsia, polyuria and visual disturbances. Last A1C was:  Lab Results  Component Value Date   HGBA1C 8.1* 02/02/2016   Lab Results  Component Value Date   GFRNONAA 87 02/02/2016   He has a history  of testosterone deficiency and is on testosterone replacement. He states that the testosterone helps with his energy, libido, muscle mass. Lab Results  Component Value Date   TESTOSTERONE 235* 02/02/2016   Patient is on Vitamin D supplement. Lab Results  Component Value Date   VD25OH 57 02/02/2016  BMI is Body mass index is 30.69 kg/(m^2)., he is working on diet and exercise and has done a good job with weight loss. Wt Readings from Last 3 Encounters:  05/10/16 217 lb (98.431 kg)  02/02/16 220 lb (99.791 kg)  11/01/15 215 lb (97.523 kg)    Current Medications:  Current Outpatient Prescriptions on File Prior to Visit  Medication Sig Dispense Refill  . aspirin 81 MG tablet Take 81 mg by mouth 2 (two) times daily.     . Cholecalciferol (VITAMIN D PO) Take 5,000 Units by mouth daily.    . hyoscyamine (LEVSIN SL) 0.125 MG SL tablet DISSOLVE ONE TO TWO TABLETS UNDER THE TONGUE 4 TIMES DAILY UP TO EVERY 4 HOURS AS NEEDED FOR NAUSEA, BLOATING, CRAMPING, OR DIARRHEA 100 tablet 0  . metFORMIN (GLUCOPHAGE-XR) 500 MG 24 hr tablet TAKE ONE TABLET BY MOUTH THREE TIMES DAILY WITH FOOD FOR  DIABETES 90 tablet 5  . OVER THE COUNTER MEDICATION CO Q10 1 cap daily    . simvastatin (ZOCOR) 80 MG tablet Take 1 tablet (80 mg total) by mouth daily. 90 tablet 1  . testosterone cypionate (DEPO-TESTOSTERONE) 200 MG/ML injection 2 ml into muscle  every 2 weeks or as directed 10 mL 5   No current facility-administered medications on file prior to visit.   Medical History:  Past Medical History  Diagnosis Date  . Abnormal EKG   . DM (diabetes mellitus) ([place])   . Hypertension   . Hyperlipidemia   . Hypogonadism male   . Vitamin D deficiency   . IBS (irritable bowel syndrome)   . Obesity     BMI 31   Allergies:  Allergies  Allergen Reactions  . Lipitor [Atorvastatin]     fatigue    Review of Systems:  Review of Systems  Constitutional: Negative.   HENT: Negative.   Eyes: Negative.   Respiratory:  Negative.   Cardiovascular: Negative.   Gastrointestinal: Negative.   Genitourinary: Negative.   Musculoskeletal: Negative.   Skin: Negative.   Neurological: Negative.   Endo/Heme/Allergies: Negative.   Psychiatric/Behavioral: Negative.     Family history- Review and unchanged Social history- Review and unchanged Physical Exam: BP 126/80 mmHg  Pulse 72  Temp(Src) 98 F (36.7 C) (Temporal)  Resp 16  Ht 5' 10.5" (1.791 m)  Wt 217 lb (98.431 kg)  BMI 30.69 kg/m2 Wt Readings from Last 3 Encounters:  05/10/16 217 lb (98.431 kg)  02/02/16 220 lb (99.791 kg)  11/01/15 215 lb (97.523 kg)   General Appearance: Well nourished, in no apparent distress. Eyes: PERRLA, EOMs, conjunctiva no swelling or erythema Sinuses: No Frontal/maxillary tenderness ENT/Mouth: Ext aud canals clear, TMs without erythema, bulging. No erythema, swelling, or exudate on post pharynx.  Tonsils not swollen or erythematous. Hearing normal.  Neck: Supple, thyroid normal.  Respiratory: Respiratory effort normal, BS equal bilaterally without rales, rhonchi, wheezing or stridor.  Cardio: RRR with no MRGs. Brisk peripheral pulses without edema.  Abdomen: Soft, + BS.  Non tender, no guarding, rebound, hernias, masses. Lymphatics: Non tender without lymphadenopathy.  Musculoskeletal: Full ROM, 5/5 strength, Normal gait Skin: Warm, dry without rashes, lesions, ecchymosis.  Neuro: Cranial nerves intact. No cerebellar symptoms.  Psych: Awake and oriented X 3, normal affect, Insight and Judgment appropriate.    Vicie Mutters, PA-C 9:11 AM [facility]

## 2016-05-11 LAB — HEMOGLOBIN A1C
HEMOGLOBIN A1C: 7.2 % — AB (ref <5.7)
MEAN PLASMA GLUCOSE: 160 mg/dL

## 2016-05-11 LAB — VITAMIN D 25 HYDROXY (VIT D DEFICIENCY, FRACTURES): Vit D, 25-Hydroxy: 51 ng/mL (ref 30–100)

## 2016-08-08 ENCOUNTER — Encounter: Payer: Self-pay | Admitting: Internal Medicine

## 2016-08-08 ENCOUNTER — Ambulatory Visit (INDEPENDENT_AMBULATORY_CARE_PROVIDER_SITE_OTHER): Payer: BLUE CROSS/BLUE SHIELD | Admitting: Internal Medicine

## 2016-08-08 VITALS — BP 124/76 | HR 72 | Temp 97.3°F | Resp 16 | Ht 70.5 in | Wt 212.6 lb

## 2016-08-08 DIAGNOSIS — R5383 Other fatigue: Secondary | ICD-10-CM

## 2016-08-08 DIAGNOSIS — E785 Hyperlipidemia, unspecified: Secondary | ICD-10-CM

## 2016-08-08 DIAGNOSIS — Z111 Encounter for screening for respiratory tuberculosis: Secondary | ICD-10-CM | POA: Diagnosis not present

## 2016-08-08 DIAGNOSIS — E1122 Type 2 diabetes mellitus with diabetic chronic kidney disease: Secondary | ICD-10-CM

## 2016-08-08 DIAGNOSIS — Z79899 Other long term (current) drug therapy: Secondary | ICD-10-CM | POA: Diagnosis not present

## 2016-08-08 DIAGNOSIS — Z1212 Encounter for screening for malignant neoplasm of rectum: Secondary | ICD-10-CM

## 2016-08-08 DIAGNOSIS — I10 Essential (primary) hypertension: Secondary | ICD-10-CM

## 2016-08-08 DIAGNOSIS — Z0000 Encounter for general adult medical examination without abnormal findings: Secondary | ICD-10-CM | POA: Diagnosis not present

## 2016-08-08 DIAGNOSIS — Z125 Encounter for screening for malignant neoplasm of prostate: Secondary | ICD-10-CM | POA: Diagnosis not present

## 2016-08-08 DIAGNOSIS — Z0001 Encounter for general adult medical examination with abnormal findings: Secondary | ICD-10-CM

## 2016-08-08 DIAGNOSIS — E559 Vitamin D deficiency, unspecified: Secondary | ICD-10-CM

## 2016-08-08 DIAGNOSIS — E291 Testicular hypofunction: Secondary | ICD-10-CM

## 2016-08-08 DIAGNOSIS — N182 Chronic kidney disease, stage 2 (mild): Secondary | ICD-10-CM

## 2016-08-08 DIAGNOSIS — Z136 Encounter for screening for cardiovascular disorders: Secondary | ICD-10-CM

## 2016-08-08 LAB — CBC WITH DIFFERENTIAL/PLATELET
BASOS PCT: 0 %
Basophils Absolute: 0 cells/uL (ref 0–200)
Eosinophils Absolute: 116 cells/uL (ref 15–500)
Eosinophils Relative: 2 %
HEMATOCRIT: 44 % (ref 38.5–50.0)
Hemoglobin: 15.2 g/dL (ref 13.2–17.1)
LYMPHS PCT: 32 %
Lymphs Abs: 1856 cells/uL (ref 850–3900)
MCH: 32.3 pg (ref 27.0–33.0)
MCHC: 34.5 g/dL (ref 32.0–36.0)
MCV: 93.6 fL (ref 80.0–100.0)
MONO ABS: 406 {cells}/uL (ref 200–950)
MONOS PCT: 7 %
MPV: 9.3 fL (ref 7.5–12.5)
NEUTROS PCT: 59 %
Neutro Abs: 3422 cells/uL (ref 1500–7800)
PLATELETS: 173 10*3/uL (ref 140–400)
RBC: 4.7 MIL/uL (ref 4.20–5.80)
RDW: 12.9 % (ref 11.0–15.0)
WBC: 5.8 10*3/uL (ref 3.8–10.8)

## 2016-08-08 LAB — IRON AND TIBC
%SAT: 49 % (ref 15–60)
Iron: 178 ug/dL (ref 50–180)
TIBC: 367 ug/dL (ref 250–425)
UIBC: 189 ug/dL (ref 125–400)

## 2016-08-08 LAB — HEPATIC FUNCTION PANEL
ALBUMIN: 4.7 g/dL (ref 3.6–5.1)
ALK PHOS: 54 U/L (ref 40–115)
ALT: 23 U/L (ref 9–46)
AST: 19 U/L (ref 10–35)
BILIRUBIN TOTAL: 0.8 mg/dL (ref 0.2–1.2)
Bilirubin, Direct: 0.2 mg/dL (ref <=0.2)
Indirect Bilirubin: 0.6 mg/dL (ref 0.2–1.2)
TOTAL PROTEIN: 7.1 g/dL (ref 6.1–8.1)

## 2016-08-08 LAB — VITAMIN B12: Vitamin B-12: 705 pg/mL (ref 200–1100)

## 2016-08-08 LAB — TSH: TSH: 1.58 m[IU]/L (ref 0.40–4.50)

## 2016-08-08 LAB — PSA: PSA: 0.3 ng/mL (ref <=4.0)

## 2016-08-08 LAB — MAGNESIUM: MAGNESIUM: 2.2 mg/dL (ref 1.5–2.5)

## 2016-08-08 LAB — BASIC METABOLIC PANEL WITH GFR
BUN: 15 mg/dL (ref 7–25)
CALCIUM: 9.6 mg/dL (ref 8.6–10.3)
CO2: 25 mmol/L (ref 20–31)
Chloride: 104 mmol/L (ref 98–110)
Creat: 1.06 mg/dL (ref 0.70–1.33)
GFR, EST AFRICAN AMERICAN: 88 mL/min (ref >=60)
GFR, EST NON AFRICAN AMERICAN: 76 mL/min (ref >=60)
GLUCOSE: 132 mg/dL — AB (ref 65–99)
POTASSIUM: 4.1 mmol/L (ref 3.5–5.3)
Sodium: 141 mmol/L (ref 135–146)

## 2016-08-08 MED ORDER — TRAZODONE HCL 150 MG PO TABS: ORAL_TABLET | ORAL | 1 refills | Status: DC

## 2016-08-08 NOTE — Progress Notes (Signed)
[facility]   [name], M.D.    Uvaldo Bristle. Silverio Lay, P.A.-C      Starlyn Skeans, P.A.-C  [facility]                [address] [place], [place] [id number] Telephone [phone] Telefax [phone] Annual  Screening/Preventative Visit  & Comprehensive Evaluation & Examination     This very nice 60 y.o.  MWM presents for a Screening/Preventative Visit & comprehensive evaluation and management of multiple medical co-morbidities.  Patient has been followed for HTN, T2_NIDDM, Hyperlipidemia and Vitamin D Deficiency. Patient does report ongoing issues with Insomnia and poor sleep hygiene, but denies snoring or apnea.      Patient has hx/o labile HTN predating from 2013 and has been monitored expectantly. Patient's BP has been controlled at home.  Today's BP is 124/76. Patient denies any cardiac symptoms as chest pain, palpitations, shortness of breath, dizziness or ankle swelling.     Patient's hyperlipidemia is controlled with diet and medications.  Patient denies myalgias or other medication SE's. Last lipids were at goal: Lab Results  Component Value Date   CHOL 144 05/10/2016   HDL 47 05/10/2016   LDLCALC 75 05/10/2016   TRIG 109 05/10/2016   CHOLHDL 3.1 05/10/2016      Patient has T2_NIDDM circa May 2012 presenting with A1c 7.3%  and patient denies reactive hypoglycemic symptoms, visual blurring, diabetic polys or paresthesias. He reports CBG's range between 140-150's. Last A1c was not at goal: Lab Results  Component Value Date   HGBA1C 7.2 (H) 05/10/2016       Patient also is on parenteral Testosterone Replacement with improved stamina & sense of well being. Finally, patient has history of Vitamin D Deficiency of "58" in 2013 and last vitamin D was improved: Lab Results  Component Value Date   VD25OH 10 05/10/2016   Current Outpatient Prescriptions on File Prior to Visit  Medication Sig   . aspirin 81 MG tablet Take 81 mg by mouth 2 (two) times daily.   . Cholecalciferol (VITAMIN D PO) Take 5,000 Units by mouth daily.  . hyoscyamine (LEVSIN SL) 0.125 MG SL tablet DISSOLVE ONE TO TWO TABLETS UNDER THE TONGUE 4 TIMES DAILY UP TO EVERY 4 HOURS AS NEEDED FOR NAUSEA, BLOATING, CRAMPING, OR DIARRHEA  . metFORMIN (GLUCOPHAGE-XR) 500 MG 24 hr tablet TAKE ONE TABLET BY MOUTH THREE TIMES DAILY WITH FOOD FOR  DIABETES  . OVER THE COUNTER MEDICATION CO Q10 1 cap daily  . simvastatin (ZOCOR) 80 MG tablet Take 1 tablet (80 mg total) by mouth daily.  Marland Kitchen testosterone cypionate (DEPO-TESTOSTERONE) 200 MG/ML injection 2 ml into muscle every 2 weeks or as directed    Allergies  Allergen Reactions  . Lipitor [Atorvastatin]     fatigue   Past Medical History:  Diagnosis Date  . Abnormal EKG   . DM (diabetes mellitus) ([place])   . Hyperlipidemia   . Hypertension   . Hypogonadism male   . IBS (irritable bowel syndrome)   . Obesity    BMI 31  . Vitamin D deficiency    Health Maintenance  Topic Date Due  . COLONOSCOPY  11/22/2015  . OPHTHALMOLOGY EXAM  03/28/2016  . INFLUENZA VACCINE  06/18/2016  . URINE MICROALBUMIN  07/17/2016  . FOOT EXAM  10/31/2016  . HEMOGLOBIN A1C  11/09/2016  . PNEUMOCOCCAL POLYSACCHARIDE  VACCINE (2) 05/17/2018  . TETANUS/TDAP  05/18/2023  . Hepatitis C Screening  Completed  . HIV Screening  Completed   Immunization History  Administered Date(s) Administered  . Influenza-Unspecified 09/02/2015  . PPD Test 05/24/2014, 07/18/2015, 08/08/2016  . Pneumococcal-Unspecified 05/17/2013  . Tdap 05/17/2013   Past Surgical History:  Procedure Laterality Date  . APPENDECTOMY    . JOINT REPLACEMENT     Family History  Problem Relation Age of Onset  . Cancer Mother     Pancreas  . Cancer Father     Prostate   Social History   Social History  . Marital status: Single    Spouse name: N/A  . Number of children: N/A   Occupational History  . Design     Social History Main Topics  . Smoking status: Never Smoker  . Smokeless tobacco: Never Used  . Alcohol use No  . Drug use: No  . Sexual activity: No    ROS Constitutional: Denies fever, chills, weight loss/gain, headaches, insomnia,  night sweats or change in appetite. Does c/o fatigue. Eyes: Denies redness, blurred vision, diplopia, discharge, itchy or watery eyes.  ENT: Denies discharge, congestion, post nasal drip, epistaxis, sore throat, earache, hearing loss, dental pain, Tinnitus, Vertigo, Sinus pain or snoring.  Cardio: Denies chest pain, palpitations, irregular heartbeat, syncope, dyspnea, diaphoresis, orthopnea, PND, claudication or edema Respiratory: denies cough, dyspnea, DOE, pleurisy, hoarseness, laryngitis or wheezing.  Gastrointestinal: Denies dysphagia, heartburn, reflux, water brash, pain, cramps, nausea, vomiting, bloating, diarrhea, constipation, hematemesis, melena, hematochezia, jaundice or hemorrhoids Genitourinary: Denies dysuria, frequency, urgency, nocturia, hesitancy, discharge, hematuria or flank pain Musculoskeletal: Denies arthralgia, myalgia, stiffness, Jt. Swelling, pain, limp or strain/sprain. Denies Falls. Skin: Denies puritis, rash, hives, warts, acne, eczema or change in skin lesion Neuro: No weakness, tremor, incoordination, spasms, paresthesia or pain Psychiatric: Denies confusion, memory loss or sensory loss. Denies Depression. Endocrine: Denies change in weight, skin, hair change, nocturia, and paresthesia, diabetic polys, visual blurring or hyper / hypo glycemic episodes.  Heme/Lymph: No excessive bleeding, bruising or enlarged lymph nodes.  Physical Exam  BP 124/76   Pulse 72   Temp 97.3 F (36.3 C)   Resp 16   Ht 5' 10.5" (1.791 m)   Wt 212 lb 9.6 oz (96.4 kg)   BMI 30.07 kg/m   General Appearance: Well nourished, in no apparent distress.  Eyes: PERRLA, EOMs, conjunctiva no swelling or erythema, normal fundi and vessels. Sinuses: No  frontal/maxillary tenderness ENT/Mouth: EACs patent / TMs  nl. Nares clear without erythema, swelling, mucoid exudates. Oral hygiene is good. No erythema, swelling, or exudate. Tongue normal, non-obstructing. Tonsils not swollen or erythematous. Hearing normal.  Neck: Supple, thyroid normal. No bruits, nodes or JVD. Respiratory: Respiratory effort normal.  BS equal and clear bilateral without rales, rhonci, wheezing or stridor. Cardio: Heart sounds are normal with regular rate and rhythm and no murmurs, rubs or gallops. Peripheral pulses are normal and equal bilaterally without edema. No aortic or femoral bruits. Chest: symmetric with normal excursions and percussion.  Abdomen: Soft, with Nl bowel sounds. Nontender, no guarding, rebound, hernias, masses, or organomegaly.  Lymphatics: Non tender without lymphadenopathy.  Genitourinary: No hernias.Testes nl. DRE - prostate nl for age - smooth & firm w/o nodules. Musculoskeletal: Full ROM all peripheral extremities, joint stability, 5/5 strength, and normal gait. Skin: Warm and dry without rashes, lesions, cyanosis, clubbing or  ecchymosis.  Neuro: Cranial nerves intact, reflexes equal bilaterally. Normal muscle tone, no cerebellar symptoms. Sensation intact to touch,  Vibratory and Monofilament to the toes bilaterally.  Pysch: Alert and oriented X 3 with normal affect, insight and judgment appropriate.   Assessment and Plan  1. Annual Preventative/Screening Exam    - Microalbumin / creatinine urine ratio - EKG 12-Lead - [place], RETROPERITNL ABD,  LTD - Vitamin B12 - POC Hemoccult Bld/Stl  - Urinalysis, Routine w reflex microscopic  - Iron and TIBC - PSA - Testosterone - HM DIABETES FOOT EXAM - LOW EXTREMITY NEUR EXAM DOCUM - CBC with Differential/Platelet - BASIC METABOLIC PANEL WITH GFR - Hepatic function panel - Magnesium - TSH - Hemoglobin A1c - Insulin, random - VITAMIN D 25 Hydroxy   2. Essential hypertension  - Microalbumin /  creatinine urine ratio - EKG 12-Lead - [place], RETROPERITNL ABD,  LTD  3. Hyperlipidemia  - EKG 12-Lead - [place], RETROPERITNL ABD,  LTD  4. Controlled type 2 diabetes mellitus with stage 2 chronic kidney disease, without long-term current use of insulin (HCC)  - Microalbumin / creatinine urine ratio - EKG 12-Lead - [place], RETROPERITNL ABD,  LTD - HM DIABETES FOOT EXAM - LOW EXTREMITY NEUR EXAM DOCUM - Hemoglobin A1c - Insulin, random  5. Vitamin D deficiency  - VITAMIN D 25 Hydroxy   6. Testosterone Deficiency  - Testosterone  7. Screening for rectal cancer  - POC Hemoccult Bld/Stl   8. Prostate cancer screening  - PSA  9. Screening for ischemic heart disease  - EKG 12-Lead  10. Screening for AAA (aortic abdominal aneurysm)  - [place], RETROPERITNL ABD,  LTD  11. Other fatigue  - Vitamin B12 - Iron and TIBC - Testosterone - CBC with Differential/Platelet - TSH  12. Medication management  - Urinalysis, Routine w reflex microscopic  - CBC with Differential/Platelet - BASIC METABOLIC PANEL WITH GFR - Hepatic function panel - Magnesium  13. Screening examination for pulmonary tuberculosis  - PPD      Continue prudent diet as discussed, weight control, BP monitoring, regular exercise, and medications as discussed.  Discussed med effects and SE's. Routine screening labs and tests as requested with regular follow-up as recommended.  Try Trazodone for insomnia. Over 40 minutes of exam, counseling, chart review and high complex critical decision making was performed

## 2016-08-08 NOTE — Patient Instructions (Signed)

## 2016-08-09 LAB — VITAMIN D 25 HYDROXY (VIT D DEFICIENCY, FRACTURES): VIT D 25 HYDROXY: 47 ng/mL (ref 30–100)

## 2016-08-09 LAB — URINALYSIS, ROUTINE W REFLEX MICROSCOPIC
BILIRUBIN URINE: NEGATIVE
GLUCOSE, UA: NEGATIVE
Hgb urine dipstick: NEGATIVE
Ketones, ur: NEGATIVE
LEUKOCYTES UA: NEGATIVE
Nitrite: NEGATIVE
PH: 6 (ref 5.0–8.0)
Protein, ur: NEGATIVE
SPECIFIC GRAVITY, URINE: 1.017 (ref 1.001–1.035)

## 2016-08-09 LAB — MICROALBUMIN / CREATININE URINE RATIO
CREATININE, URINE: 111 mg/dL (ref 20–370)
Microalb Creat Ratio: 3 mcg/mg creat (ref <30)
Microalb, Ur: 0.3 mg/dL

## 2016-08-09 LAB — INSULIN, RANDOM: Insulin: 23 u[IU]/mL — ABNORMAL HIGH (ref 2.0–19.6)

## 2016-08-09 LAB — HEMOGLOBIN A1C
HEMOGLOBIN A1C: 6.5 % — AB (ref <5.7)
MEAN PLASMA GLUCOSE: 140 mg/dL

## 2016-08-09 LAB — TESTOSTERONE: Testosterone: 206 ng/dL — ABNORMAL LOW (ref 250–827)

## 2016-09-09 ENCOUNTER — Other Ambulatory Visit: Payer: Self-pay | Admitting: Internal Medicine

## 2016-10-23 ENCOUNTER — Other Ambulatory Visit: Payer: Self-pay | Admitting: *Deleted

## 2016-10-23 ENCOUNTER — Other Ambulatory Visit: Payer: Self-pay | Admitting: Internal Medicine

## 2016-10-23 DIAGNOSIS — E291 Testicular hypofunction: Secondary | ICD-10-CM

## 2016-10-23 MED ORDER — SIMVASTATIN 80 MG PO TABS: 80.0000 mg | ORAL_TABLET | Freq: Every day | ORAL | 1 refills | Status: DC

## 2016-10-23 NOTE — Telephone Encounter (Signed)
Please call Depo - Testosterone 

## 2016-10-26 ENCOUNTER — Other Ambulatory Visit: Payer: Self-pay | Admitting: Internal Medicine

## 2016-10-26 DIAGNOSIS — E349 Endocrine disorder, unspecified: Secondary | ICD-10-CM

## 2016-10-26 MED ORDER — "SYRINGE/NEEDLE (DISP) 21G X 1"" 3 ML MISC": 1 refills | Status: DC

## 2016-11-22 ENCOUNTER — Ambulatory Visit (INDEPENDENT_AMBULATORY_CARE_PROVIDER_SITE_OTHER): Payer: BLUE CROSS/BLUE SHIELD | Admitting: Internal Medicine

## 2016-11-22 ENCOUNTER — Encounter: Payer: Self-pay | Admitting: Internal Medicine

## 2016-11-22 VITALS — BP 112/62 | HR 68 | Temp 98.2°F | Resp 18 | Ht 70.5 in | Wt 214.0 lb

## 2016-11-22 DIAGNOSIS — E1122 Type 2 diabetes mellitus with diabetic chronic kidney disease: Secondary | ICD-10-CM | POA: Diagnosis not present

## 2016-11-22 DIAGNOSIS — E559 Vitamin D deficiency, unspecified: Secondary | ICD-10-CM

## 2016-11-22 DIAGNOSIS — E291 Testicular hypofunction: Secondary | ICD-10-CM | POA: Diagnosis not present

## 2016-11-22 DIAGNOSIS — N182 Chronic kidney disease, stage 2 (mild): Secondary | ICD-10-CM

## 2016-11-22 DIAGNOSIS — E782 Mixed hyperlipidemia: Secondary | ICD-10-CM

## 2016-11-22 DIAGNOSIS — I10 Essential (primary) hypertension: Secondary | ICD-10-CM

## 2016-11-22 DIAGNOSIS — Z79899 Other long term (current) drug therapy: Secondary | ICD-10-CM | POA: Diagnosis not present

## 2016-11-22 LAB — HEPATIC FUNCTION PANEL
ALBUMIN: 4.5 g/dL (ref 3.6–5.1)
ALK PHOS: 58 U/L (ref 40–115)
ALT: 25 U/L (ref 9–46)
AST: 18 U/L (ref 10–35)
BILIRUBIN TOTAL: 0.8 mg/dL (ref 0.2–1.2)
Bilirubin, Direct: 0.2 mg/dL (ref <=0.2)
Indirect Bilirubin: 0.6 mg/dL (ref 0.2–1.2)
Total Protein: 7.1 g/dL (ref 6.1–8.1)

## 2016-11-22 LAB — CBC WITH DIFFERENTIAL/PLATELET
BASOS ABS: 0 {cells}/uL (ref 0–200)
Basophils Relative: 0 %
EOS PCT: 3 %
Eosinophils Absolute: 189 cells/uL (ref 15–500)
HEMATOCRIT: 45.5 % (ref 38.5–50.0)
Hemoglobin: 15.9 g/dL (ref 13.2–17.1)
Lymphocytes Relative: 36 %
Lymphs Abs: 2268 cells/uL (ref 850–3900)
MCH: 32.9 pg (ref 27.0–33.0)
MCHC: 34.9 g/dL (ref 32.0–36.0)
MCV: 94.2 fL (ref 80.0–100.0)
MONO ABS: 378 {cells}/uL (ref 200–950)
MPV: 9.2 fL (ref 7.5–12.5)
Monocytes Relative: 6 %
NEUTROS ABS: 3465 {cells}/uL (ref 1500–7800)
Neutrophils Relative %: 55 %
Platelets: 209 10*3/uL (ref 140–400)
RBC: 4.83 MIL/uL (ref 4.20–5.80)
RDW: 13.4 % (ref 11.0–15.0)
WBC: 6.3 10*3/uL (ref 3.8–10.8)

## 2016-11-22 LAB — BASIC METABOLIC PANEL WITH GFR
BUN: 16 mg/dL (ref 7–25)
CHLORIDE: 103 mmol/L (ref 98–110)
CO2: 26 mmol/L (ref 20–31)
Calcium: 9.5 mg/dL (ref 8.6–10.3)
Creat: 0.94 mg/dL (ref 0.70–1.25)
GFR, EST NON AFRICAN AMERICAN: 88 mL/min (ref >=60)
GLUCOSE: 190 mg/dL — AB (ref 65–99)
Potassium: 4.2 mmol/L (ref 3.5–5.3)
SODIUM: 138 mmol/L (ref 135–146)

## 2016-11-22 LAB — LIPID PANEL
CHOL/HDL RATIO: 4.3 ratio (ref <5.0)
CHOLESTEROL: 165 mg/dL (ref <200)
HDL: 38 mg/dL — AB (ref >40)
LDL Cholesterol: 98 mg/dL (ref <100)
TRIGLYCERIDES: 143 mg/dL (ref <150)
VLDL: 29 mg/dL (ref <30)

## 2016-11-22 LAB — HEMOGLOBIN A1C
Hgb A1c MFr Bld: 7.1 % — ABNORMAL HIGH (ref <5.7)
Mean Plasma Glucose: 157 mg/dL

## 2016-11-22 LAB — TSH: TSH: 1.39 mIU/L (ref 0.40–4.50)

## 2016-11-22 MED ORDER — ESZOPICLONE 2 MG PO TABS: 2.0000 mg | ORAL_TABLET | Freq: Every day | ORAL | 0 refills | Status: DC

## 2016-11-22 NOTE — Progress Notes (Signed)
Assessment and Plan:  Hypertension:  -well controlled at visit today -Continue medication,  -monitor blood pressure at home.  -Continue DASH diet.   -Reminder to go to the ER if any CP, SOB, nausea, dizziness, severe HA, changes vision/speech, left arm numbness and tingling, and jaw pain.  Cholesterol: -Continue diet and exercise.  -Check cholesterol.   Diabetes without complication: -cont current medications -does sound like A1c will likely be higher on these labs -Continue diet and exercise.  -Check A1C  Vitamin D Def: -check level -continue medications.   Hypogonadism -patient weighing whether he wants to completely discontinue or try topical  Continue diet and meds as discussed. Further disposition pending results of labs.  HPI 61 y.o. male  presents for 3 month follow up with hypertension, hyperlipidemia, prediabetes and vitamin D.   His blood pressure has been controlled at home, today their BP is BP: 112/62.   He does not workout. He denies chest pain, shortness of breath, dizziness.   He is on cholesterol medication and denies myalgias. His cholesterol is at goal. The cholesterol last visit was:   Lab Results  Component Value Date   CHOL 144 05/10/2016   HDL 47 05/10/2016   LDLCALC 75 05/10/2016   TRIG 109 05/10/2016   CHOLHDL 3.1 05/10/2016     He has been working on diet and exercise for prediabetes, and denies foot ulcerations, hyperglycemia, hypoglycemia , increased appetite, nausea, paresthesia of the feet, polydipsia, polyuria, visual disturbances, vomiting and weight loss. Last A1C in the office was:  Lab Results  Component Value Date   HGBA1C 6.5 (H) 08/08/2016  He reports that he had a hard time with his diet over the holidays.  He reports that sugars have been running mildly high.  He reports that he had been doing really good.  He generally checks his blood sugars at nighttime.  He is getting somewhere between 130-330.  He reports that this is usually  within an hour of eating.    Patient is on Vitamin D supplement.  Lab Results  Component Value Date   VD25OH 47 08/08/2016      He is having testosterone injections. His last shot was 3 weeks ago.  He reports that he is a little apprehensive about it.  He does feel better on it.  He just doesn't like doing the shots.  He would prefer to be off it.    Current Medications:  Current Outpatient Prescriptions on File Prior to Visit  Medication Sig Dispense Refill  . aspirin 81 MG tablet Take 81 mg by mouth 2 (two) times daily.     . Cholecalciferol (VITAMIN D PO) Take 5,000 Units by mouth daily.    . metFORMIN (GLUCOPHAGE-XR) 500 MG 24 hr tablet TAKE ONE TABLET BY MOUTH THREE TIMES DAILY WITH FOOD FOR DIABETES 90 tablet 3  . OVER THE COUNTER MEDICATION CO Q10 1 cap daily    . simvastatin (ZOCOR) 80 MG tablet Take 1 tablet (80 mg total) by mouth daily. 90 tablet 1  . SYRINGE-NEEDLE, DISP, 3 ML 21G X 1" 3 ML MISC As directed 50 each 1  . testosterone cypionate (DEPOTESTOSTERONE CYPIONATE) 200 MG/ML injection INJECT 2ML INTRAMUSCULARLY EVERY 2 WEEKS AS DIRECTED 10 mL 3  . traZODone (DESYREL) 150 MG tablet Take 1/3 to 1/2 to 1 tablet 1 hour before sleep 90 tablet 1   No current facility-administered medications on file prior to visit.     Medical History:  Past Medical History:  Diagnosis  Date  . Abnormal EKG   . DM (diabetes mellitus) ([place])   . Hyperlipidemia   . Hypertension   . Hypogonadism male   . IBS (irritable bowel syndrome)   . Obesity    BMI 31  . Vitamin D deficiency     Allergies:  Allergies  Allergen Reactions  . Lipitor [Atorvastatin]     fatigue     Review of Systems:  Review of Systems  Constitutional: Negative for chills, fever and malaise/fatigue.  HENT: Negative for congestion, ear pain and sore throat.   Eyes: Negative.   Respiratory: Negative for cough, shortness of breath and wheezing.   Cardiovascular: Negative for chest pain, palpitations and leg  swelling.  Gastrointestinal: Negative for abdominal pain, blood in stool, constipation, diarrhea, heartburn and melena.  Genitourinary: Negative.   Skin: Negative.   Neurological: Negative for dizziness, sensory change, loss of consciousness and headaches.  Psychiatric/Behavioral: Negative for depression. The patient is not nervous/anxious and does not have insomnia.     Family history- Review and unchanged  Social history- Review and unchanged  Physical Exam: BP 112/62   Pulse 68   Temp 98.2 F (36.8 C) (Temporal)   Resp 18   Ht 5' 10.5" (1.791 m)   Wt 214 lb (97.1 kg)   BMI 30.27 kg/m  Wt Readings from Last 3 Encounters:  11/22/16 214 lb (97.1 kg)  08/08/16 212 lb 9.6 oz (96.4 kg)  05/10/16 217 lb (98.4 kg)    General Appearance: Well nourished well developed, in no apparent distress. Eyes: PERRLA, EOMs, conjunctiva no swelling or erythema ENT/Mouth: Ear canals normal without obstruction, swelling, erythma, discharge.  TMs normal bilaterally.  Oropharynx moist, clear, without exudate, or postoropharyngeal swelling. Neck: Supple, thyroid normal,no cervical adenopathy  Respiratory: Respiratory effort normal, Breath sounds clear A&P without rhonchi, wheeze, or rale.  No retractions, no accessory usage. Cardio: RRR with no MRGs. Brisk peripheral pulses without edema.  Abdomen: Soft, + BS,  Non tender, no guarding, rebound, hernias, masses. Musculoskeletal: Full ROM, 5/5 strength, Normal gait Skin: Warm, dry without rashes, lesions, ecchymosis.  Neuro: Awake and oriented X 3, Cranial nerves intact. Normal muscle tone, no cerebellar symptoms. Psych: Normal affect, Insight and Judgment appropriate.    Starlyn Skeans, PA-C 9:14 AM [facility]

## 2016-12-30 ENCOUNTER — Other Ambulatory Visit: Payer: Self-pay | Admitting: Internal Medicine

## 2016-12-30 MED ORDER — ESZOPICLONE 3 MG PO TABS: ORAL_TABLET | ORAL | 1 refills | Status: DC

## 2017-01-18 ENCOUNTER — Other Ambulatory Visit: Payer: Self-pay | Admitting: Internal Medicine

## 2017-01-21 ENCOUNTER — Encounter: Payer: Self-pay | Admitting: *Deleted

## 2017-02-27 NOTE — Progress Notes (Signed)
This very nice 61 y.o. MWM presents for 6 month follow up with Hypertension, Hyperlipidemia, Pre-Diabetes and Vitamin D Deficiency. Patient also relates long hx/ and ongoing issues with insomnia.      Patient has labile HTN monitored expectantly since 2013 & BP has been controlled at home. Today's BP is at goal - 124/72. Patient has had no complaints of any cardiac type chest pain, palpitations, dyspnea/orthopnea/PND, dizziness, claudication, or dependent edema.     Hyperlipidemia is controlled with diet & meds. Patient denies myalgias or other med SE's. Last Lipids were at goal:  Lab Results  Component Value Date   CHOL 165 11/22/2016   HDL 38 (L) 11/22/2016   LDLCALC 98 11/22/2016   TRIG 143 11/22/2016   CHOLHDL 4.3 11/22/2016      Also, the patient has history of T2_NIDDM (A1c 7.3% in 2012) and has had no symptoms of reactive hypoglycemia, diabetic polys, paresthesias or visual blurring.  Patient reports CBG's have been occasionally running up to 200 mg%. Last A1c was not at goal: Lab Results  Component Value Date   HGBA1C 7.1 (H) 11/22/2016      Patient has hx/o Low Testosterone and has been on parenteral replacement with improved stamina & sense of well-being. Further, the patient also has history of Vitamin D Deficiency ("44" on treatment in 2013) and supplements vitamin D without any suspected side-effects. Last vitamin D was   Lab Results  Component Value Date   VD25OH 47 08/08/2016   Current Outpatient Prescriptions on File Prior to Visit  Medication Sig  . aspirin 81 MG  Take  2  x daily.   Marland Kitchen VITAMIN D 5,000 Units Take daily.  . metFORMIN-XR 500 MG  TAKE ONE TAB 3-4 x / DAILY   . CO Q10  1 cap daily  . simvastatin  80 MG tablet Take 1 tab daily.  Marland Kitchen testosterone cypio 200 MG injec INJECT 2ML IM EVERY 2 WKS   Allergies  Allergen Reactions  . Lipitor [Atorvastatin]     fatigue   PMHx:   Past Medical History:  Diagnosis Date  . Abnormal EKG   . DM (diabetes  mellitus) ([place])   . Hyperlipidemia   . Hypertension   . Hypogonadism male   . IBS (irritable bowel syndrome)   . Obesity    BMI 31  . Vitamin D deficiency    Immunization History  Administered Date(s) Administered  . Influenza-Unspecified 09/02/2015  . PPD Test 05/24/2014, 07/18/2015, 08/08/2016  . Pneumococcal-Unspecified 05/17/2013  . Tdap 05/17/2013   Past Surgical History:  Procedure Laterality Date  . APPENDECTOMY    . JOINT REPLACEMENT     FHx:    Reviewed / unchanged  SHx:    Reviewed / unchanged  Systems Review:  Constitutional: Denies fever, chills, wt changes, headaches, insomnia, fatigue, night sweats, change in appetite. Eyes: Denies redness, blurred vision, diplopia, discharge, itchy, watery eyes.  ENT: Denies discharge, congestion, post nasal drip, epistaxis, sore throat, earache, hearing loss, dental pain, tinnitus, vertigo, sinus pain, snoring.  CV: Denies chest pain, palpitations, irregular heartbeat, syncope, dyspnea, diaphoresis, orthopnea, PND, claudication or edema. Respiratory: denies cough, dyspnea, DOE, pleurisy, hoarseness, laryngitis, wheezing.  Gastrointestinal: Denies dysphagia, odynophagia, heartburn, reflux, water brash, abdominal pain or cramps, nausea, vomiting, bloating, diarrhea, constipation, hematemesis, melena, hematochezia  or hemorrhoids. Genitourinary: Denies dysuria, frequency, urgency, nocturia, hesitancy, discharge, hematuria or flank pain. Musculoskeletal: Denies arthralgias, myalgias, stiffness, jt. swelling, pain, limping or strain/sprain.  Skin: Denies pruritus,  rash, hives, warts, acne, eczema or change in skin lesion(s). Neuro: No weakness, tremor, incoordination, spasms, paresthesia or pain. Psychiatric: Denies confusion, memory loss or sensory loss. Endo: Denies change in weight, skin or hair change.  Heme/Lymph: No excessive bleeding, bruising or enlarged lymph nodes.  Physical Exam  BP 124/72   P 80   T 97.7 F   R 16    Ht 5' 10.5"    Wt 216 lb 9.6 oz    BMI 30.64   Appears well nourished, well groomed  and in no distress.  Eyes: PERRLA, EOMs, conjunctiva no swelling or erythema. Sinuses: No frontal/maxillary tenderness ENT/Mouth: EAC's clear, TM's nl w/o erythema, bulging. Nares clear w/o erythema, swelling, exudates. Oropharynx clear without erythema or exudates. Oral hygiene is good. Tongue normal, non obstructing. Hearing intact.  Neck: Supple. Thyroid nl. Car 2+/2+ without bruits, nodes or JVD. Chest: Respirations nl with BS clear & equal w/o rales, rhonchi, wheezing or stridor.  Cor: Heart sounds normal w/ regular rate and rhythm without sig. murmurs, gallops, clicks or rubs. Peripheral pulses normal and equal  without edema.  Abdomen: Soft & bowel sounds normal. Non-tender w/o guarding, rebound, hernias, masses or organomegaly.  Lymphatics: Unremarkable.  Musculoskeletal: Full ROM all peripheral extremities, joint stability, 5/5 strength and normal gait.  Skin: Warm, dry without exposed rashes, lesions or ecchymosis apparent.  Neuro: Cranial nerves intact, reflexes equal bilaterally. Sensory-motor testing grossly intact. Tendon reflexes grossly intact.  Pysch: Alert & oriented x 3.  Insight and judgement nl & appropriate. No ideations.  Assessment and Plan:  1. Essential hypertension  - Continue medication, monitor blood pressure at home.  - Continue DASH diet. Reminder to go to the ER if any CP,  SOB, nausea, dizziness, severe HA, changes vision/speech,  left arm numbness and tingling and jaw pain.  - CBC with Differential/Platelet - BASIC METABOLIC PANEL WITH GFR - Magnesium - TSH  2. Mixed hyperlipidemia  - Continue diet/meds, exercise,& lifestyle modifications.  - Continue monitor periodic cholesterol/liver & renal functions   - Hepatic function panel - Lipid panel - TSH  3. Controlled type 2 diabetes mellitus with stage 2 chronic kidney disease, without long-term current use  of insulin (HCC)  - Continue diet, exercise, lifestyle modifications.  - Monitor appropriate labs.  - Hemoglobin A1c - Insulin, random  4. Vitamin D deficiency  - Continue supplementation.  - VITAMIN D 25 Hydroxy   5. Testosterone Deficiency  - Testosterone  6. Medication management  - CBC with Differential/Platelet - BASIC METABOLIC PANEL WITH GFR - Hepatic function panel - Magnesium - Lipid panel - TSH - Hemoglobin A1c - Insulin, random - VITAMIN D 25 Hydroxy  - Testosterone  7. Insomnia  - Rx Gabapentin 100 mg # 90 tid or 1-3 at hour of sleep       Discussed  regular exercise, BP monitoring, weight control to achieve/maintain BMI less than 25 and discussed med and SE's. Recommended labs to assess and monitor clinical status with further disposition pending results of labs. Over 30 minutes of exam, counseling, chart review was performed.

## 2017-02-27 NOTE — Patient Instructions (Signed)
Testosterone injection What is this medicine? TESTOSTERONE (tes TOS ter one) is the main male hormone. It supports normal male development such as muscle growth, facial hair, and deep voice. It is used in males to treat low testosterone levels. This medicine may be used for other purposes; ask your health care provider or pharmacist if you have questions. COMMON BRAND NAME(S): Andro-L.A., Aveed, Delatestryl, Depo-Testosterone, Virilon What should I tell my health care provider before I take this medicine? They need to know if you have any of these conditions: -cancer -diabetes -heart disease -kidney disease -liver disease -lung disease -prostate disease -an unusual or allergic reaction to testosterone, other medicines, foods, dyes, or preservatives -pregnant or trying to get pregnant -breast-feeding How should I use this medicine? This medicine is for injection into a muscle. It is usually given by a health care professional in a hospital or clinic setting. Contact your pediatrician regarding the use of this medicine in children. While this medicine may be prescribed for children as young as 12 years of age for selected conditions, precautions do apply. Overdosage: If you think you have taken too much of this medicine contact a poison control center or emergency room at once. NOTE: This medicine is only for you. Do not share this medicine with others. What if I miss a dose? Try not to miss a dose. Your doctor or health care professional will tell you when your next injection is due. Notify the office if you are unable to keep an appointment. What may interact with this medicine? -medicines for diabetes -medicines that treat or prevent blood clots like warfarin -oxyphenbutazone -propranolol -steroid medicines like prednisone or cortisone This list may not describe all possible interactions. Give your health care provider a list of all the medicines, herbs, non-prescription drugs, or  dietary supplements you use. Also tell them if you smoke, drink alcohol, or use illegal drugs. Some items may interact with your medicine. What should I watch for while using this medicine? Visit your doctor or health care professional for regular checks on your progress. They will need to check the level of testosterone in your blood. This medicine is only approved for use in men who have low levels of testosterone related to certain medical conditions. Heart attacks and strokes have been reported with the use of this medicine. Notify your doctor or health care professional and seek emergency treatment if you develop breathing problems; changes in vision; confusion; chest pain or chest tightness; sudden arm pain; severe, sudden headache; trouble speaking or understanding; sudden numbness or weakness of the face, arm or leg; loss of balance or coordination. Talk to your doctor about the risks and benefits of this medicine. This medicine may affect blood sugar levels. If you have diabetes, check with your doctor or health care professional before you change your diet or the dose of your diabetic medicine. Testosterone injections are not commonly used in women. Women should inform their doctor if they wish to become pregnant or think they might be pregnant. There is a potential for serious side effects to an unborn child. Talk to your health care professional or pharmacist for more information. Talk with your doctor or health care professional about your birth control options while taking this medicine. This drug is banned from use in athletes by most athletic organizations. What side effects may I notice from receiving this medicine? Side effects that you should report to your doctor or health care professional as soon as possible: -allergic reactions like skin rash,   itching or hives, swelling of the face, lips, or tongue -breast enlargement -breathing problems -changes in emotions or moods -deep or  hoarse voice -irregular menstrual periods -signs and symptoms of liver injury like dark yellow or brown urine; general ill feeling or flu-like symptoms; light-colored stools; loss of appetite; nausea; right upper belly pain; unusually weak or tired; yellowing of the eyes or skin -stomach pain -swelling of the ankles, feet, hands -too frequent or persistent erections -trouble passing urine or change in the amount of urine Side effects that usually do not require medical attention (report to your doctor or health care professional if they continue or are bothersome): -acne -change in sex drive or performance -facial hair growth -hair loss -headache This list may not describe all possible side effects. Call your doctor for medical advice about side effects. You may report side effects to FDA at 1-800-FDA-1088. Where should I keep my medicine? Keep out of the reach of children. This medicine can be abused. Keep your medicine in a safe place to protect it from theft. Do not share this medicine with anyone. Selling or giving away this medicine is dangerous and against the law. Store at room temperature between 20 and 25 degrees C (68 and 77 degrees F). Do not freeze. Protect from light. Follow the directions for the product you are prescribed. Throw away any unused medicine after the expiration date. NOTE: This sheet is a summary. It may not cover all possible information. If you have questions about this medicine, talk to your doctor, pharmacist, or health care provider.  2018 Elsevier/Gold Standard (2015-12-09 07:33:55)  ++++++++++++++++++++++++++ Recommend Adult Low Dose Aspirin or  coated  Aspirin 81 mg daily  To reduce risk of Colon Cancer 20 %,  Skin Cancer 26 % ,  Melanoma 46%  and  Pancreatic cancer 60% +++++++++++++++++++++++++ Vitamin D goal  is between 70-100.  Please make sure that you are taking your Vitamin D as directed.  It is very important as a natural anti-inflammatory   helping hair, skin, and nails, as well as reducing stroke and heart attack risk.  It helps your bones and helps with mood. It also decreases numerous cancer risks so please take it as directed.  Low Vit D is associated with a 200-300% higher risk for CANCER  and 200-300% higher risk for HEART   ATTACK  &  STROKE.   ...................................... It is also associated with higher death rate at younger ages,  autoimmune diseases like Rheumatoid arthritis, Lupus, Multiple Sclerosis.    Also many other serious conditions, like depression, Alzheimer's Dementia, infertility, muscle aches, fatigue, fibromyalgia - just to name a few. ++++++++++++++++++++ Recommend the book "The END of DIETING" by Dr Joel Fuhrman  & the book "The END of DIABETES " by Dr Joel Fuhrman At [employer] - get book & Audio CD's    Being diabetic has a  300% increased risk for heart attack, stroke, cancer, and alzheimer- type vascular dementia. It is very important that you work harder with diet by avoiding all foods that are white. Avoid white rice (brown & wild rice is OK), white potatoes (sweetpotatoes in moderation is OK), White bread or wheat bread or anything made out of white flour like bagels, donuts, rolls, buns, biscuits, cakes, pastries, cookies, pizza crust, and pasta (made from white flour & egg whites) - vegetarian pasta or spinach or wheat pasta is OK. Multigrain breads like Arnold's or Pepperidge [employer], or multigrain sandwich thins or flatbreads.  Diet, exercise and weight   loss can reverse and cure diabetes in the early stages.  Diet, exercise and weight loss is very important in the control and prevention of complications of diabetes which affects every system in your body, ie. Brain - dementia/stroke, eyes - glaucoma/blindness, heart - heart attack/heart failure, kidneys - dialysis, stomach - gastric paralysis, intestines - malabsorption, nerves - severe painful neuritis, circulation - gangrene & loss of a  leg(s), and finally cancer and Alzheimers.    I recommend avoid fried & greasy foods,  sweets/candy, white rice (brown or wild rice or Quinoa is OK), white potatoes (sweet potatoes are OK) - anything made from white flour - bagels, doughnuts, rolls, buns, biscuits,white and wheat breads, pizza crust and traditional pasta made of white flour & egg white(vegetarian pasta or spinach or wheat pasta is OK).  Multi-grain bread is OK - like multi-grain flat bread or sandwich thins. Avoid alcohol in excess. Exercise is also important.    Eat all the vegetables you want - avoid meat, especially red meat and dairy - especially cheese.  Cheese is the most concentrated form of trans-fats which is the worst thing to clog up our arteries. Veggie cheese is OK which can be found in the fresh produce section at [employer] or [employer] or [employer]  +++++++++++++++++++++ DASH Eating Plan  DASH stands for "Dietary Approaches to Stop Hypertension."   The DASH eating plan is a healthy eating plan that has been shown to reduce high blood pressure (hypertension). Additional health benefits may include reducing the risk of type 2 diabetes mellitus, heart disease, and stroke. The DASH eating plan may also help with weight loss. WHAT DO I NEED TO KNOW ABOUT THE DASH EATING PLAN? For the DASH eating plan, you will follow these general guidelines:  Choose foods with a percent daily value for sodium of less than 5% (as listed on the food label).  Use salt-free seasonings or herbs instead of table salt or sea salt.  Check with your health care provider or pharmacist before using salt substitutes.  Eat lower-sodium products, often labeled as "lower sodium" or "no salt added."  Eat fresh foods.  Eat more vegetables, fruits, and low-fat dairy products.  Choose whole grains. Look for the word "whole" as the first word in the ingredient list.  Choose fish   Limit sweets, desserts, sugars, and sugary  drinks.  Choose heart-healthy fats.  Eat veggie cheese   Eat more home-cooked food and less restaurant, buffet, and fast food.  Limit fried foods.  Cook foods using methods other than frying.  Limit canned vegetables. If you do use them, rinse them well to decrease the sodium.  When eating at a restaurant, ask that your food be prepared with less salt, or no salt if possible.                      WHAT FOODS CAN I EAT? Read Dr Joel Fuhrman's books on The End of Dieting & The End of Diabetes  Grains Whole grain or whole wheat bread. Brown rice. Whole grain or whole wheat pasta. Quinoa, bulgur, and whole grain cereals. Low-sodium cereals. Corn or whole wheat flour tortillas. Whole grain cornbread. Whole grain crackers. Low-sodium crackers.  Vegetables Fresh or frozen vegetables (raw, steamed, roasted, or grilled). Low-sodium or reduced-sodium tomato and vegetable juices. Low-sodium or reduced-sodium tomato sauce and paste. Low-sodium or reduced-sodium canned vegetables.   Fruits All fresh, canned (in natural juice), or frozen fruits.  Protein Products  All   fish and seafood.  Dried beans, peas, or lentils. Unsalted nuts and seeds. Unsalted canned beans.  Dairy Low-fat dairy products, such as skim or 1% milk, 2% or reduced-fat cheeses, low-fat ricotta or cottage cheese, or plain low-fat yogurt. Low-sodium or reduced-sodium cheeses.  Fats and Oils Tub margarines without trans fats. Light or reduced-fat mayonnaise and salad dressings (reduced sodium). Avocado. Safflower, olive, or canola oils. Natural peanut or almond butter.  Other Unsalted popcorn and pretzels. The items listed above may not be a complete list of recommended foods or beverages. Contact your dietitian for more options.  +++++++++++++++  WHAT FOODS ARE NOT RECOMMENDED? Grains/ White flour or wheat flour White bread. White pasta. White rice. Refined cornbread. Bagels and croissants. Crackers that contain trans  fat.  Vegetables  Creamed or fried vegetables. Vegetables in a . Regular canned vegetables. Regular canned tomato sauce and paste. Regular tomato and vegetable juices.  Fruits Dried fruits. Canned fruit in light or heavy syrup. Fruit juice.  Meat and Other Protein Products Meat in general - RED meat & White meat.  Fatty cuts of meat. Ribs, chicken wings, all processed meats as bacon, sausage, bologna, salami, fatback, hot dogs, bratwurst and packaged luncheon meats.  Dairy Whole or 2% milk, cream, half-and-half, and cream cheese. Whole-fat or sweetened yogurt. Full-fat cheeses or blue cheese. Non-dairy creamers and whipped toppings. Processed cheese, cheese spreads, or cheese curds.  Condiments Onion and garlic salt, seasoned salt, table salt, and sea salt. Canned and packaged gravies. Worcestershire sauce. Tartar sauce. Barbecue sauce. Teriyaki sauce. Soy sauce, including reduced sodium. Steak sauce. Fish sauce. Oyster sauce. Cocktail sauce. Horseradish. Ketchup and mustard. Meat flavorings and tenderizers. Bouillon cubes. Hot sauce. Tabasco sauce. Marinades. Taco seasonings. Relishes.  Fats and Oils Butter, stick margarine, lard, shortening and bacon fat. Coconut, palm kernel, or palm oils. Regular salad dressings.  Pickles and olives. Salted popcorn and pretzels.  The items listed above may not be a complete list of foods and beverages to avoid.   

## 2017-02-28 ENCOUNTER — Encounter: Payer: Self-pay | Admitting: Internal Medicine

## 2017-02-28 ENCOUNTER — Ambulatory Visit (INDEPENDENT_AMBULATORY_CARE_PROVIDER_SITE_OTHER): Payer: BLUE CROSS/BLUE SHIELD | Admitting: Internal Medicine

## 2017-02-28 VITALS — BP 124/72 | HR 80 | Temp 97.7°F | Resp 16 | Ht 70.5 in | Wt 216.6 lb

## 2017-02-28 DIAGNOSIS — Z79899 Other long term (current) drug therapy: Secondary | ICD-10-CM

## 2017-02-28 DIAGNOSIS — E559 Vitamin D deficiency, unspecified: Secondary | ICD-10-CM

## 2017-02-28 DIAGNOSIS — E291 Testicular hypofunction: Secondary | ICD-10-CM | POA: Diagnosis not present

## 2017-02-28 DIAGNOSIS — E782 Mixed hyperlipidemia: Secondary | ICD-10-CM | POA: Diagnosis not present

## 2017-02-28 DIAGNOSIS — E1122 Type 2 diabetes mellitus with diabetic chronic kidney disease: Secondary | ICD-10-CM | POA: Diagnosis not present

## 2017-02-28 DIAGNOSIS — I10 Essential (primary) hypertension: Secondary | ICD-10-CM

## 2017-02-28 DIAGNOSIS — N182 Chronic kidney disease, stage 2 (mild): Secondary | ICD-10-CM

## 2017-02-28 LAB — CBC WITH DIFFERENTIAL/PLATELET
BASOS ABS: 0 {cells}/uL (ref 0–200)
Basophils Relative: 0 %
EOS ABS: 240 {cells}/uL (ref 15–500)
EOS PCT: 4 %
HCT: 43.7 % (ref 38.5–50.0)
HEMOGLOBIN: 15.2 g/dL (ref 13.2–17.1)
LYMPHS ABS: 2040 {cells}/uL (ref 850–3900)
Lymphocytes Relative: 34 %
MCH: 32.5 pg (ref 27.0–33.0)
MCHC: 34.8 g/dL (ref 32.0–36.0)
MCV: 93.4 fL (ref 80.0–100.0)
MONO ABS: 540 {cells}/uL (ref 200–950)
MPV: 9.2 fL (ref 7.5–12.5)
Monocytes Relative: 9 %
NEUTROS ABS: 3180 {cells}/uL (ref 1500–7800)
Neutrophils Relative %: 53 %
Platelets: 178 10*3/uL (ref 140–400)
RBC: 4.68 MIL/uL (ref 4.20–5.80)
RDW: 13.1 % (ref 11.0–15.0)
WBC: 6 10*3/uL (ref 3.8–10.8)

## 2017-02-28 LAB — HEPATIC FUNCTION PANEL
ALT: 37 U/L (ref 9–46)
AST: 26 U/L (ref 10–35)
Albumin: 4.3 g/dL (ref 3.6–5.1)
Alkaline Phosphatase: 62 U/L (ref 40–115)
BILIRUBIN DIRECT: 0.1 mg/dL (ref <=0.2)
Indirect Bilirubin: 0.6 mg/dL (ref 0.2–1.2)
Total Bilirubin: 0.7 mg/dL (ref 0.2–1.2)
Total Protein: 7.2 g/dL (ref 6.1–8.1)

## 2017-02-28 LAB — LIPID PANEL
CHOL/HDL RATIO: 4.5 ratio (ref <5.0)
Cholesterol: 166 mg/dL (ref <200)
HDL: 37 mg/dL — ABNORMAL LOW (ref >40)
LDL Cholesterol: 95 mg/dL (ref <100)
Triglycerides: 171 mg/dL — ABNORMAL HIGH (ref <150)
VLDL: 34 mg/dL — AB (ref <30)

## 2017-02-28 LAB — BASIC METABOLIC PANEL WITH GFR
BUN: 19 mg/dL (ref 7–25)
CHLORIDE: 102 mmol/L (ref 98–110)
CO2: 27 mmol/L (ref 20–31)
CREATININE: 1.13 mg/dL (ref 0.70–1.25)
Calcium: 9.3 mg/dL (ref 8.6–10.3)
GFR, Est African American: 81 mL/min (ref >=60)
GFR, Est Non African American: 70 mL/min (ref >=60)
Glucose, Bld: 191 mg/dL — ABNORMAL HIGH (ref 65–99)
Potassium: 4.4 mmol/L (ref 3.5–5.3)
SODIUM: 138 mmol/L (ref 135–146)

## 2017-02-28 LAB — MAGNESIUM: MAGNESIUM: 2.2 mg/dL (ref 1.5–2.5)

## 2017-02-28 LAB — TESTOSTERONE: Testosterone: 482 ng/dL (ref 250–827)

## 2017-02-28 LAB — TSH: TSH: 1.35 m[IU]/L (ref 0.40–4.50)

## 2017-02-28 MED ORDER — GABAPENTIN 100 MG PO CAPS: 100.0000 mg | ORAL_CAPSULE | Freq: Three times a day (TID) | ORAL | 2 refills | Status: DC

## 2017-03-01 LAB — HEMOGLOBIN A1C
Hgb A1c MFr Bld: 7.4 % — ABNORMAL HIGH (ref <5.7)
Mean Plasma Glucose: 166 mg/dL

## 2017-03-01 LAB — VITAMIN D 25 HYDROXY (VIT D DEFICIENCY, FRACTURES): Vit D, 25-Hydroxy: 51 ng/mL (ref 30–100)

## 2017-03-03 LAB — INSULIN, RANDOM: Insulin: 14.8 u[IU]/mL (ref 2.0–19.6)

## 2017-03-24 ENCOUNTER — Other Ambulatory Visit: Payer: Self-pay | Admitting: Internal Medicine

## 2017-04-16 ENCOUNTER — Ambulatory Visit: Payer: BLUE CROSS/BLUE SHIELD | Admitting: Internal Medicine

## 2017-04-16 ENCOUNTER — Encounter: Payer: Self-pay | Admitting: Internal Medicine

## 2017-04-16 VITALS — BP 118/82 | HR 72 | Temp 97.3°F | Resp 16 | Ht 70.5 in | Wt 217.6 lb

## 2017-04-16 DIAGNOSIS — W57XXXA Bitten or stung by nonvenomous insect and other nonvenomous arthropods, initial encounter: Secondary | ICD-10-CM

## 2017-04-16 NOTE — Progress Notes (Signed)
     Patient had wife remove a tick from his back on 04/10/2017 and the area is sl tender and red per wife.  Denies k/o rash and no fever, chills, myalgias, arthralgias or pruritis.     Area of tick bite inspected and finds a sl tender area of thickening about a tiny central scab. No sign of cellulitis.   Imp- tick bite.     Patient reassured and advised of sx's to monitor for as above. Advised to trey apply OTC Hydrocortisone 1%  To the area 3-4 x/ day.  Advise to call or return if problems, questions or concerns.  (No charge today)

## 2017-04-19 ENCOUNTER — Other Ambulatory Visit: Payer: Self-pay | Admitting: Internal Medicine

## 2017-05-19 ENCOUNTER — Other Ambulatory Visit: Payer: Self-pay | Admitting: Physician Assistant

## 2017-05-19 ENCOUNTER — Other Ambulatory Visit: Payer: Self-pay | Admitting: Internal Medicine

## 2017-06-10 NOTE — Progress Notes (Signed)
Assessment and Plan:   Hypertension -Continue medication, monitor blood pressure at home. Continue DASH diet.  Reminder to go to the ER if any CP, SOB, nausea, dizziness, severe HA, changes vision/speech, left arm numbness and tingling and jaw pain.   Cholesterol -Continue diet and exercise. Check cholesterol.    Diabetes with diabetic chronic kidney disease -Continue diet and exercise. Check A1C   Vitamin D Def - check level and continue medications.   Obesity with co morbidities-  long discussion about weight loss, diet, and exercise  Hypogonadism - continue to monitor, try natural ways to increase testosterone.   Cerumen impaction - stop using Qtips, irrigation used in the office without complications, use OTC drops/oil at home to prevent reoccurence   Continue diet and meds as discussed. Further disposition pending results of labs. Discussed med's effects and SE's.   Over 30 minutes of exam, counseling, chart review, and critical decision making was performed   HPI 61 y.o. male  presents for 3 month follow up on hypertension, cholesterol, diabetes and vitamin D deficiency.   His blood pressure has been controlled at home, today his BP is BP: 120/70.  He does not workout. He denies chest pain, shortness of breath, dizziness.  He is on cholesterol medication, simvastatin 80mg  1/2 pill daily and denies myalgias. His cholesterol is not at goal. The cholesterol was:   Lab Results  Component Value Date   CHOL 166 02/28/2017   HDL 37 (L) 02/28/2017   LDLCALC 95 02/28/2017   TRIG 171 (H) 02/28/2017   CHOLHDL 4.5 02/28/2017    He has been working on diet and exercise for diabetes with diabetic chronic kidney disease, he is on bASA, he is not on ACE/ARB, he is on metformin takes 2-3 a day, on probiotic, needs levsin, will take occ, does not check sugars regularly and denies  paresthesia of the feet, polydipsia, polyuria and visual disturbances. Last A1C was:  Lab Results   Component Value Date   HGBA1C 7.4 (H) 02/28/2017   Lab Results  Component Value Date   GFRNONAA 70 02/28/2017   He has a history of testosterone deficiency and is on testosterone replacement. He states that the testosterone helps with his energy, libido, muscle mass. Lab Results  Component Value Date   TESTOSTERONE 482 02/28/2017   Patient is on Vitamin D supplement. Lab Results  Component Value Date   VD25OH 51 02/28/2017   BMI is Body mass index is 30.36 kg/m., he is working on diet and exercise and has done a good job with weight loss. Wt Readings from Last 3 Encounters:  06/11/17 214 lb 9.6 oz (97.3 kg)  04/16/17 217 lb 9.6 oz (98.7 kg)  02/28/17 216 lb 9.6 oz (98.2 kg)    Current Medications:  Current Outpatient Prescriptions on File Prior to Visit  Medication Sig Dispense Refill  . aspirin 81 MG tablet Take 81 mg by mouth 2 (two) times daily.     . Cholecalciferol (VITAMIN D PO) Take 5,000 Units by mouth daily.    Marland Kitchen gabapentin (NEURONTIN) 100 MG capsule TAKE 1 CAPSULE (100 MG TOTAL) BY MOUTH 3 (THREE) TIMES DAILY. 90 capsule 2  . metFORMIN (GLUCOPHAGE-XR) 500 MG 24 hr tablet TAKE ONE TABLET BY MOUTH THREE TIMES DAILY WITH FOOD FOR DIABETES 90 tablet 1  . OVER THE COUNTER MEDICATION CO Q10 1 cap daily    . RED YEAST RICE EXTRACT PO Take 1 capsule by mouth.    . simvastatin (ZOCOR) 80 MG  tablet TAKE 1 TABLET (80 MG TOTAL) BY MOUTH DAILY. 90 tablet 1  . SYRINGE-NEEDLE, DISP, 3 ML 21G X 1" 3 ML MISC As directed 50 each 1  . testosterone cypionate (DEPOTESTOSTERONE CYPIONATE) 200 MG/ML injection INJECT 2ML INTRAMUSCULARLY EVERY 2 WEEKS AS DIRECTED 10 mL 3   No current facility-administered medications on file prior to visit.    Medical History:  Past Medical History:  Diagnosis Date  . Abnormal EKG   . DM (diabetes mellitus) ([place])   . Hyperlipidemia   . Hypertension   . Hypogonadism male   . IBS (irritable bowel syndrome)   . Obesity    BMI 31  . Vitamin D  deficiency    Allergies:  Allergies  Allergen Reactions  . Lipitor [Atorvastatin]     fatigue    Review of Systems:  Review of Systems  Constitutional: Negative.   HENT: Negative.   Eyes: Negative.   Respiratory: Negative.   Cardiovascular: Negative.   Gastrointestinal: Negative.   Genitourinary: Negative.   Musculoskeletal: Negative.   Skin: Negative.   Neurological: Negative.   Endo/Heme/Allergies: Negative.   Psychiatric/Behavioral: Negative.     Family history- Review and unchanged Social history- Review and unchanged Physical Exam: BP 120/70   Pulse 78   Temp 97.9 F (36.6 C)   Resp 16   Ht 5' 10.5" (1.791 m)   Wt 214 lb 9.6 oz (97.3 kg)   SpO2 97%   BMI 30.36 kg/m  Wt Readings from Last 3 Encounters:  06/11/17 214 lb 9.6 oz (97.3 kg)  04/16/17 217 lb 9.6 oz (98.7 kg)  02/28/17 216 lb 9.6 oz (98.2 kg)   General Appearance: Well nourished, in no apparent distress. Eyes: PERRLA, EOMs, conjunctiva no swelling or erythema Sinuses: No Frontal/maxillary tenderness ENT/Mouth: Ext aud canals clear, after bilateral cleaning in the office, TMs without erythema, bulging. No erythema, swelling, or exudate on post pharynx.  Tonsils not swollen or erythematous. Hearing normal.  Neck: Supple, thyroid normal.  Respiratory: Respiratory effort normal, BS equal bilaterally without rales, rhonchi, wheezing or stridor.  Cardio: RRR with no MRGs. Brisk peripheral pulses without edema.  Abdomen: Soft, + BS.  Non tender, no guarding, rebound, hernias, masses. Lymphatics: Non tender without lymphadenopathy.  Musculoskeletal: Full ROM, 5/5 strength, Normal gait Skin: Warm, dry without rashes, lesions, ecchymosis.  Neuro: Cranial nerves intact. No cerebellar symptoms.  Psych: Awake and oriented X 3, normal affect, Insight and Judgment appropriate.    Vicie Mutters, PA-C 9:30 AM [facility]

## 2017-06-11 ENCOUNTER — Ambulatory Visit (INDEPENDENT_AMBULATORY_CARE_PROVIDER_SITE_OTHER): Payer: BLUE CROSS/BLUE SHIELD | Admitting: Physician Assistant

## 2017-06-11 ENCOUNTER — Encounter: Payer: Self-pay | Admitting: Physician Assistant

## 2017-06-11 VITALS — BP 120/70 | HR 78 | Temp 97.9°F | Resp 16 | Ht 70.5 in | Wt 214.6 lb

## 2017-06-11 DIAGNOSIS — E6609 Other obesity due to excess calories: Secondary | ICD-10-CM

## 2017-06-11 DIAGNOSIS — H9203 Otalgia, bilateral: Secondary | ICD-10-CM

## 2017-06-11 DIAGNOSIS — H6123 Impacted cerumen, bilateral: Secondary | ICD-10-CM | POA: Diagnosis not present

## 2017-06-11 DIAGNOSIS — E782 Mixed hyperlipidemia: Secondary | ICD-10-CM | POA: Diagnosis not present

## 2017-06-11 DIAGNOSIS — I10 Essential (primary) hypertension: Secondary | ICD-10-CM

## 2017-06-11 DIAGNOSIS — E291 Testicular hypofunction: Secondary | ICD-10-CM | POA: Diagnosis not present

## 2017-06-11 DIAGNOSIS — Z79899 Other long term (current) drug therapy: Secondary | ICD-10-CM

## 2017-06-11 DIAGNOSIS — N182 Chronic kidney disease, stage 2 (mild): Secondary | ICD-10-CM | POA: Diagnosis not present

## 2017-06-11 DIAGNOSIS — Z6830 Body mass index (BMI) 30.0-30.9, adult: Secondary | ICD-10-CM

## 2017-06-11 DIAGNOSIS — E1122 Type 2 diabetes mellitus with diabetic chronic kidney disease: Secondary | ICD-10-CM | POA: Diagnosis not present

## 2017-06-11 LAB — HEPATIC FUNCTION PANEL
ALT: 27 U/L (ref 9–46)
AST: 19 U/L (ref 10–35)
Albumin: 4.4 g/dL (ref 3.6–5.1)
Alkaline Phosphatase: 70 U/L (ref 40–115)
BILIRUBIN DIRECT: 0.1 mg/dL (ref <=0.2)
BILIRUBIN TOTAL: 0.6 mg/dL (ref 0.2–1.2)
Indirect Bilirubin: 0.5 mg/dL (ref 0.2–1.2)
Total Protein: 6.9 g/dL (ref 6.1–8.1)

## 2017-06-11 LAB — LIPID PANEL
CHOL/HDL RATIO: 3.5 ratio (ref <5.0)
CHOLESTEROL: 135 mg/dL (ref <200)
HDL: 39 mg/dL — ABNORMAL LOW (ref >40)
LDL Cholesterol: 78 mg/dL (ref <100)
TRIGLYCERIDES: 92 mg/dL (ref <150)
VLDL: 18 mg/dL (ref <30)

## 2017-06-11 LAB — CBC WITH DIFFERENTIAL/PLATELET
Basophils Absolute: 0 cells/uL (ref 0–200)
Basophils Relative: 0 %
EOS PCT: 2 %
Eosinophils Absolute: 106 cells/uL (ref 15–500)
HCT: 43.1 % (ref 38.5–50.0)
HEMOGLOBIN: 14.8 g/dL (ref 13.2–17.1)
LYMPHS ABS: 1961 {cells}/uL (ref 850–3900)
LYMPHS PCT: 37 %
MCH: 32.3 pg (ref 27.0–33.0)
MCHC: 34.3 g/dL (ref 32.0–36.0)
MCV: 94.1 fL (ref 80.0–100.0)
MONOS PCT: 8 %
MPV: 9.4 fL (ref 7.5–12.5)
Monocytes Absolute: 424 cells/uL (ref 200–950)
NEUTROS PCT: 53 %
Neutro Abs: 2809 cells/uL (ref 1500–7800)
Platelets: 176 10*3/uL (ref 140–400)
RBC: 4.58 MIL/uL (ref 4.20–5.80)
RDW: 13 % (ref 11.0–15.0)
WBC: 5.3 10*3/uL (ref 3.8–10.8)

## 2017-06-11 LAB — BASIC METABOLIC PANEL WITH GFR
BUN: 13 mg/dL (ref 7–25)
CHLORIDE: 105 mmol/L (ref 98–110)
CO2: 21 mmol/L (ref 20–31)
Calcium: 9 mg/dL (ref 8.6–10.3)
Creat: 0.94 mg/dL (ref 0.70–1.25)
GFR, EST NON AFRICAN AMERICAN: 88 mL/min (ref >=60)
GFR, Est African American: >89 mL/min (ref >=60)
Glucose, Bld: 215 mg/dL — ABNORMAL HIGH (ref 65–99)
POTASSIUM: 4.2 mmol/L (ref 3.5–5.3)
SODIUM: 138 mmol/L (ref 135–146)

## 2017-06-11 LAB — TSH: TSH: 1.27 m[IU]/L (ref 0.40–4.50)

## 2017-06-11 MED ORDER — HYOSCYAMINE SULFATE 0.125 MG SL SUBL: SUBLINGUAL_TABLET | SUBLINGUAL | 0 refills | Status: DC

## 2017-06-11 NOTE — Patient Instructions (Signed)
Cologuard is an easy to use noninvasive colon cancer screening test based on the latest advances in stool DNA science.   Colon cancer is 3rd most diagnosed cancer and 2nd leading cause of death in both men and women 61 years of age and older despite being one of the most preventable and treatable cancers if found early.  4 of out 5 people diagnosed with colon cancer have NO prior family history.  When caught EARLY 90% of colon cancer is curable.   You have agreed to do a Cologuard screening and have declined a colonoscopy in spite of being explained the risks and benefits of the colonoscopy in detail, including cancer and death. Please understand that this is test not as sensitive or specific as a colonoscopy and you are still recommended to get a colonoscopy.   If you are NOT medicare please call your insurance company and give them these items to see if they will cover it: 1) CPT code, [phone] 2) Provider is Probation officer 3) [employer] NPI [id number] 4) [employer] Tax ID [id number]  Out-of-pocket cost for Cologuard can range from $0 - $649 so please call  You will receive a short call from [place] support center at [employer], when you receive a call they will say they are from [place],  to confirm your mailing address and give you more information.  When they calll you, it will appear on the caller ID as "[employer]" or in some cases only this number will appear, [phone].   [employer] will ship your collection kit directly to you. You will collect a single stool sample in the privacy of your own home, no special preparation required. You will return the kit via [employer] pre-paid shipping or pick-up, in the same box it arrived in. Then I will contact you to discuss your results after I receive them from the laboratory.   If you have any questions or concerns, Cologuard Customer Support Specialist are available 24 hours a  day, 7 days a week at [phone] or go to [email or web address].   Diabetes is a very complicated disease...lets simplify it.  An easy way to look at it to understand the complications is if you think of the extra sugar floating in your blood stream as glass shards floating through your blood stream.    Diabetes affects your small vessels first: 1) The glass shards (sugar) scraps down the tiny blood vessels in your eyes and lead to diabetic retinopathy, the leading cause of blindness in the [place]. Diabetes is the leading cause of newly diagnosed adult (28 to 61 years of age) blindness in the [place].  2) The glass shards scratches down the tiny vessels of your legs leading to nerve damage called neuropathy and can lead to amputations of your feet. More than 60% of all non-traumatic amputations of lower limbs occur in people with diabetes.  3) Over time the small vessels in your brain are shredded and closed off, individually this does not cause any problems but over a long period of time many of the small vessels being blocked can lead to Vascular Dementia.   4) Your kidney's are a filter system and have a "net" that keeps certain things in the body and lets bad things out. Sugar shreds this net and leads to kidney damage and eventually failure. Decreasing the sugar that is destroying the net and certain blood pressure medications can help stop or decrease progression of kidney disease.  Diabetes was the primary cause of kidney failure in 44 percent of all new cases in 2011.  5) Diabetes also destroys the small vessels in your penis that lead to erectile dysfunction. Eventually the vessels are so damaged that you may not be responsive to cialis or viagra.   Diabetes and your large vessels: Your larger vessels consist of your coronary arteries in your heart and the carotid vessels to your brain. Diabetes or even increased sugars put you at 300% increased risk of heart attack and stroke and  this is why.. The sugar scrapes down your large blood vessels and your body sees this as an internal injury and tries to repair itself. Just like you get a scab on your skin, your platelets will stick to the blood vessel wall trying to heal it. This is why we have diabetics on low dose aspirin daily, this prevents the platelets from sticking and can prevent plaque formation. In addition, your body takes cholesterol and tries to shove it into the open wound. This is why we want your LDL, or bad cholesterol, below 70.   The combination of platelets and cholesterol over 5-10 years forms plaque that can break off and cause a heart attack or stroke.   PLEASE REMEMBER:  Diabetes is preventable! Up to 65 percent of complications and morbidities among individuals with type 2 diabetes can be prevented, delayed, or effectively treated and minimized with regular visits to a health professional, appropriate monitoring and medication, and a healthy diet and lifestyle.  We are starting you on Metformin to prevent or treat diabetes. Metformin does not cause low blood sugars. In order to create energy your cells need insulin and sugar but sometime your cells do not accept the insulin and this can cause increased sugars and decreased energy. The Metformin helps your cells accept insulin and the sugar to give you more energy.   The two most common side effects are nausea and diarrhea, follow these rules to avoid it! You can take imodium per box instructions when starting metformin if needed.   Rules of metformin: 1) start out slow with only one pill daily. Our goal for you is 4 pills a day or 2071m total.  2) take with your largest meal. 3) Take with least amount of carbs.   Call if you have any problems.      Bad carbs also include fruit juice, alcohol, and sweet tea. These are empty calories that do not signal to your brain that you are full.   Please remember the good carbs are still carbs which convert  into sugar. So please measure them out no more than 1/2-1 cup of rice, oatmeal, pasta, and beans  Veggies are however free foods! Pile them on.   Not all fruit is created equal. Please see the list below, the fruit at the bottom is higher in sugars than the fruit at the top. Please avoid all dried fruits.

## 2017-06-12 LAB — HEMOGLOBIN A1C
Hgb A1c MFr Bld: 7.5 % — ABNORMAL HIGH (ref <5.7)
Mean Plasma Glucose: 169 mg/dL

## 2017-06-12 LAB — MAGNESIUM: Magnesium: 2.1 mg/dL (ref 1.5–2.5)

## 2017-07-14 ENCOUNTER — Other Ambulatory Visit: Payer: Self-pay | Admitting: Physician Assistant

## 2017-07-23 ENCOUNTER — Other Ambulatory Visit: Payer: Self-pay | Admitting: Physician Assistant

## 2017-07-23 DIAGNOSIS — E291 Testicular hypofunction: Secondary | ICD-10-CM

## 2017-07-23 MED ORDER — TESTOSTERONE CYPIONATE 200 MG/ML IM SOLN: INTRAMUSCULAR | 3 refills | Status: DC

## 2017-07-23 NOTE — Progress Notes (Signed)
TEST-PHARMACY-SEPT 5TH-

## 2017-08-17 ENCOUNTER — Other Ambulatory Visit: Payer: Self-pay | Admitting: Physician Assistant

## 2017-09-11 ENCOUNTER — Ambulatory Visit (INDEPENDENT_AMBULATORY_CARE_PROVIDER_SITE_OTHER): Payer: BLUE CROSS/BLUE SHIELD | Admitting: Internal Medicine

## 2017-09-11 VITALS — BP 114/72 | HR 72 | Temp 97.7°F | Resp 18 | Ht 70.25 in | Wt 213.4 lb

## 2017-09-11 DIAGNOSIS — IMO0002 Reserved for concepts with insufficient information to code with codable children: Secondary | ICD-10-CM

## 2017-09-11 DIAGNOSIS — N182 Chronic kidney disease, stage 2 (mild): Secondary | ICD-10-CM

## 2017-09-11 DIAGNOSIS — Z125 Encounter for screening for malignant neoplasm of prostate: Secondary | ICD-10-CM

## 2017-09-11 DIAGNOSIS — E559 Vitamin D deficiency, unspecified: Secondary | ICD-10-CM | POA: Diagnosis not present

## 2017-09-11 DIAGNOSIS — Z111 Encounter for screening for respiratory tuberculosis: Secondary | ICD-10-CM

## 2017-09-11 DIAGNOSIS — Z136 Encounter for screening for cardiovascular disorders: Secondary | ICD-10-CM | POA: Diagnosis not present

## 2017-09-11 DIAGNOSIS — Z1212 Encounter for screening for malignant neoplasm of rectum: Secondary | ICD-10-CM

## 2017-09-11 DIAGNOSIS — E782 Mixed hyperlipidemia: Secondary | ICD-10-CM

## 2017-09-11 DIAGNOSIS — I10 Essential (primary) hypertension: Secondary | ICD-10-CM

## 2017-09-11 DIAGNOSIS — R5383 Other fatigue: Secondary | ICD-10-CM

## 2017-09-11 DIAGNOSIS — Z0001 Encounter for general adult medical examination with abnormal findings: Secondary | ICD-10-CM

## 2017-09-11 DIAGNOSIS — Z0000 Encounter for general adult medical examination without abnormal findings: Secondary | ICD-10-CM

## 2017-09-11 DIAGNOSIS — Z79899 Other long term (current) drug therapy: Secondary | ICD-10-CM | POA: Diagnosis not present

## 2017-09-11 DIAGNOSIS — E291 Testicular hypofunction: Secondary | ICD-10-CM

## 2017-09-11 DIAGNOSIS — E1165 Type 2 diabetes mellitus with hyperglycemia: Secondary | ICD-10-CM

## 2017-09-11 DIAGNOSIS — Z1211 Encounter for screening for malignant neoplasm of colon: Secondary | ICD-10-CM

## 2017-09-11 DIAGNOSIS — E1122 Type 2 diabetes mellitus with diabetic chronic kidney disease: Secondary | ICD-10-CM

## 2017-09-11 NOTE — Patient Instructions (Signed)

## 2017-09-11 NOTE — Progress Notes (Signed)
[facility]   [name], M.D.     Uvaldo Bristle. Silverio Lay, P.A.-C Liane Comber, [place]                [address] [place] [address] Telephone [phone] Telefax [phone] Annual  Screening/Preventative Visit  & Comprehensive Evaluation & Examination     This very nice 61 y.o. MWM presents for a Screening/Preventative Visit & comprehensive evaluation and management of multiple medical co-morbidities.  Patient has been followed for HTN, T2_NIDDM  , Hyperlipidemia and Vitamin D Deficiency.     Patient is followed expectantly for labile HTN circa 2013. Patient's BP has been controlled at home.  Today's BP is at goal - 114/72. Patient denies any cardiac symptoms as chest pain, palpitations, shortness of breath, dizziness or ankle swelling.     Patient's hyperlipidemia is controlled with diet and medications. Patient denies myalgias or other medication SE's. Current lipids are at goal: Lab Results  Component Value Date   CHOL 157 09/11/2017   HDL 38 (L) 09/11/2017   LDLCALC 78 06/11/2017   TRIG 157 (H) 09/11/2017   CHOLHDL 4.1 09/11/2017      Patient has T2_NIDDM (A1c 7.3% in 2012) and with CKD2 (GFR 88).  Patient denies reactive hypoglycemic symptoms, visual blurring, diabetic polys or paresthesias. Patient admits very poor dietary habits.  Current A1c is still not at goal: Lab Results  Component Value Date   HGBA1C 7.5 (H) 09/11/2017       Patient has hx/o Low T 206 in Sept 2017, and reports increased stamina, improved mood and libido on replacement therapy. Finally, patient has history of Vitamin D Deficiency ("44" on treatment in 2013) and current  vitamin D is at goal (70-100): Lab Results  Component Value Date   VD25OH 62 09/11/2017   Current Outpatient Prescriptions on File Prior to Visit  Medication Sig  . aspirin 81 MG tablet Take 81 mg by mouth 2 (two) times daily.    . Cholecalciferol (VITAMIN D PO) Take 5,000 Units by mouth daily.  Marland Kitchen gabapentin (NEURONTIN) 100 MG capsule TAKE 1 CAPSULE BY MOUTH THREE TIMES A DAY  . hyoscyamine (LEVSIN SL) 0.125 MG SL tablet DISSOLVE ONE TABLET UNDER THE TONGUE 4 TIMES DAILY UP TO EVERY 4 HOURS AS NEEDED FOR NAUSEA, BLOATING, CRAMPING, OR DIARRHEA  . metFORMIN (GLUCOPHAGE-XR) 500 MG 24 hr tablet TAKE ONE TABLET BY MOUTH THREE TIMES DAILY WITH FOOD FOR DIABETES  . RED YEAST RICE EXTRACT PO Take 1 capsule by mouth.  . simvastatin (ZOCOR) 80 MG tablet TAKE 1 TABLET (80 MG TOTAL) BY MOUTH DAILY.  . SYRINGE-NEEDLE, DISP, 3 ML 21G X 1" 3 ML MISC As directed  . testosterone cypionate (DEPOTESTOSTERONE CYPIONATE) 200 MG/ML injection INJECT 2ML INTRAMUSCULARLY EVERY 2 WEEKS AS DIRECTED   No current facility-administered medications on file prior to visit.    Allergies  Allergen Reactions  . Lipitor [Atorvastatin]     fatigue   Past Medical History:  Diagnosis Date  . Abnormal EKG   . DM (diabetes mellitus) ([place])   . Hyperlipidemia   . Hypertension   . Hypogonadism male   . IBS (irritable bowel syndrome)   . Obesity    BMI 31  . Vitamin D deficiency    Health Maintenance  Topic Date Due  . COLONOSCOPY  11/22/2015  . INFLUENZA VACCINE  06/18/2017  . OPHTHALMOLOGY EXAM  12/25/2017  . HEMOGLOBIN A1C  03/12/2018  . PNEUMOCOCCAL POLYSACCHARIDE VACCINE (2) 05/17/2018  . FOOT EXAM  09/11/2018  . URINE MICROALBUMIN  09/11/2018  . TETANUS/TDAP  05/18/2023  . Hepatitis C Screening  Completed  . HIV Screening  Completed   Immunization History  Administered Date(s) Administered  . Influenza-Unspecified 09/02/2015  . PPD Test 05/24/2014, 07/18/2015, 08/08/2016, 09/11/2017  . Pneumococcal-Unspecified 05/17/2013  . Tdap 05/17/2013   Past Surgical History:  Procedure Laterality Date  . APPENDECTOMY    . JOINT REPLACEMENT     Family History  Problem Relation Age of Onset  . Cancer Mother        Pancreas  . Cancer  Father        Prostate   Social History   Social History  . Marital status: Single    Spouse name: N/A  . Number of children: N/A  . Years of education: N/A   Occupational History  . Not on file.   Social History Main Topics  . Smoking status: Never Smoker  . Smokeless tobacco: Never Used  . Alcohol use No  . Drug use: No  . Sexual activity: No    ROS Constitutional: Denies fever, chills, weight loss/gain, headaches, insomnia,  night sweats or change in appetite. Does c/o fatigue. Eyes: Denies redness, blurred vision, diplopia, discharge, itchy or watery eyes.  ENT: Denies discharge, congestion, post nasal drip, epistaxis, sore throat, earache, hearing loss, dental pain, Tinnitus, Vertigo, Sinus pain or snoring.  Cardio: Denies chest pain, palpitations, irregular heartbeat, syncope, dyspnea, diaphoresis, orthopnea, PND, claudication or edema Respiratory: denies cough, dyspnea, DOE, pleurisy, hoarseness, laryngitis or wheezing.  Gastrointestinal: Denies dysphagia, heartburn, reflux, water brash, pain, cramps, nausea, vomiting, bloating, diarrhea, constipation, hematemesis, melena, hematochezia, jaundice or hemorrhoids Genitourinary: Denies dysuria, frequency, urgency, nocturia, hesitancy, discharge, hematuria or flank pain Musculoskeletal: Denies arthralgia, myalgia, stiffness, Jt. Swelling, pain, limp or strain/sprain. Denies Falls. Skin: Denies puritis, rash, hives, warts, acne, eczema or change in skin lesion Neuro: No weakness, tremor, incoordination, spasms, paresthesia or pain Psychiatric: Denies confusion, memory loss or sensory loss. Denies Depression. Endocrine: Denies change in weight, skin, hair change, nocturia, and paresthesia, diabetic polys, visual blurring or hyper / hypo glycemic episodes.  Heme/Lymph: No excessive bleeding, bruising or enlarged lymph nodes.  Physical Exam  BP 114/72   Pulse 72   Temp 97.7 F (36.5 C)   Resp 18   Ht 5' 10.25" (1.784 m)   Wt  213 lb 6.4 oz (96.8 kg)   BMI 30.40 kg/m   General Appearance: Over nourished and well groomed and in no apparent distress.  Eyes: PERRLA, EOMs, conjunctiva no swelling or erythema, normal fundi and vessels. Sinuses: No frontal/maxillary tenderness ENT/Mouth: EACs patent / TMs  nl. Nares clear without erythema, swelling, mucoid exudates. Oral hygiene is good. No erythema, swelling, or exudate. Tongue normal, non-obstructing. Tonsils not swollen or erythematous. Hearing normal.  Neck: Supple, thyroid normal. No bruits, nodes or JVD. Respiratory: Respiratory effort normal.  BS equal and clear bilateral without rales, rhonci, wheezing or stridor. Cardio: Heart sounds are normal with regular rate and rhythm and no murmurs, rubs or gallops. Peripheral pulses are normal and equal bilaterally without edema. No aortic or femoral bruits. Chest: symmetric with normal excursions and percussion.  Abdomen: Soft, with Nl bowel sounds. Nontender, no guarding, rebound, hernias, masses, or organomegaly.  Lymphatics: Non tender without lymphadenopathy.  Genitourinary: No hernias.Testes nl. DRE - prostate nl for age - smooth &  firm w/o nodules. Musculoskeletal: Full ROM all peripheral extremities, joint stability, 5/5 strength, and normal gait. Skin: Warm and dry without rashes, lesions, cyanosis, clubbing or  ecchymosis.  Neuro: Cranial nerves intact, reflexes equal bilaterally. Normal muscle tone, no cerebellar symptoms. Sensation intact.  Pysch: Alert and oriented X 3 with normal affect, insight and judgment appropriate.   Assessment and Plan  1. Annual Preventative/Screening Exam   2. Essential hypertension  - EKG 12-Lead - [place], RETROPERITNL ABD,  LTD - Urinalysis, Routine w reflex microscopic - CBC with Differential/Platelet - BASIC METABOLIC PANEL WITH GFR - Magnesium - TSH - Microalbumin / creatinine urine ratio  3. Hyperlipidemia, mixed  - EKG 12-Lead - [place], RETROPERITNL ABD,  LTD -  Hepatic function panel - Lipid panel - TSH  4. Uncontrolled type 2 diabetes mellitus with stage 2 chronic kidney disease, without long-term current use of insulin (HCC)  - EKG 12-Lead - [place], RETROPERITNL ABD,  LTD - HM DIABETES FOOT EXAM - LOW EXTREMITY NEUR EXAM DOCUM - Hemoglobin A1c - Insulin, random - Microalbumin / creatinine urine ratio  5. Vitamin D deficiency  - VITAMIN D 25 Hydroxy  6. Testosterone Deficiency  - Testosterone  7. Screening examination for pulmonary tuberculosis  - PPD  8. Screening for colorectal cancer  - POC Hemoccult Bld/Stl  9. Prostate cancer screening  - PSA  10. Screening for ischemic heart disease  - EKG 12-Lead  11. Screening for AAA (aortic abdominal aneurysm)  - [place], RETROPERITNL ABD,  LTD  12. Fatigue, unspecified type  - Iron,Total/Total Iron Binding Cap - Methylmalonic acid, serum - Testosterone - CBC with Differential/Platelet - TSH  13. Medication management  - Urinalysis, Routine w reflex microscopic - CBC with Differential/Platelet - BASIC METABOLIC PANEL WITH GFR - Hepatic function panel - Magnesium - Lipid panel - TSH - Hemoglobin A1c - Insulin, random - VITAMIN D 25 Hydroxy  - Microalbumin / creatinine urine ratio         Patient was counseled in prudent diet, weight control to achieve/maintain BMI less than 25, BP monitoring, regular exercise and medications as discussed.  Discussed med effects and SE's. Routine screening labs and tests as requested with regular follow-up as recommended. Over 40 minutes of exam, counseling, chart review and high complex critical decision making was performed

## 2017-09-14 ENCOUNTER — Encounter: Payer: Self-pay | Admitting: Internal Medicine

## 2017-09-14 LAB — VITAMIN D 25 HYDROXY (VIT D DEFICIENCY, FRACTURES): Vit D, 25-Hydroxy: 62 ng/mL (ref 30–100)

## 2017-09-14 LAB — HEPATIC FUNCTION PANEL
AG RATIO: 1.7 (calc) (ref 1.0–2.5)
ALKALINE PHOSPHATASE (APISO): 59 U/L (ref 40–115)
ALT: 24 U/L (ref 9–46)
AST: 19 U/L (ref 10–35)
Albumin: 4.5 g/dL (ref 3.6–5.1)
BILIRUBIN DIRECT: 0.1 mg/dL (ref 0.0–0.2)
BILIRUBIN TOTAL: 0.6 mg/dL (ref 0.2–1.2)
Globulin: 2.7 g/dL (calc) (ref 1.9–3.7)
Indirect Bilirubin: 0.5 mg/dL (calc) (ref 0.2–1.2)
Total Protein: 7.2 g/dL (ref 6.1–8.1)

## 2017-09-14 LAB — URINALYSIS, ROUTINE W REFLEX MICROSCOPIC
Bilirubin Urine: NEGATIVE
HGB URINE DIPSTICK: NEGATIVE
LEUKOCYTES UA: NEGATIVE
NITRITE: NEGATIVE
PROTEIN: NEGATIVE
Specific Gravity, Urine: 1.022 (ref 1.001–1.03)
pH: 6.5 (ref 5.0–8.0)

## 2017-09-14 LAB — CBC WITH DIFFERENTIAL/PLATELET
Basophils Absolute: 38 cells/uL (ref 0–200)
Basophils Relative: 0.5 %
EOS ABS: 120 {cells}/uL (ref 15–500)
EOS PCT: 1.6 %
HEMATOCRIT: 42.6 % (ref 38.5–50.0)
Hemoglobin: 15.2 g/dL (ref 13.2–17.1)
LYMPHS ABS: 2250 {cells}/uL (ref 850–3900)
MCH: 32.3 pg (ref 27.0–33.0)
MCHC: 35.7 g/dL (ref 32.0–36.0)
MCV: 90.6 fL (ref 80.0–100.0)
MPV: 10.1 fL (ref 7.5–12.5)
Monocytes Relative: 7.6 %
Neutro Abs: 4523 cells/uL (ref 1500–7800)
Neutrophils Relative %: 60.3 %
Platelets: 185 10*3/uL (ref 140–400)
RBC: 4.7 10*6/uL (ref 4.20–5.80)
RDW: 12 % (ref 11.0–15.0)
Total Lymphocyte: 30 %
WBC mixed population: 570 cells/uL (ref 200–950)
WBC: 7.5 10*3/uL (ref 3.8–10.8)

## 2017-09-14 LAB — METHYLMALONIC ACID, SERUM: Methylmalonic Acid, Quant: 117 nmol/L (ref 87–318)

## 2017-09-14 LAB — LIPID PANEL
Cholesterol: 157 mg/dL (ref <200)
HDL: 38 mg/dL — AB (ref >40)
LDL Cholesterol (Calc): 93 mg/dL (calc)
NON-HDL CHOLESTEROL (CALC): 119 mg/dL (ref <130)
TRIGLYCERIDES: 157 mg/dL — AB (ref <150)
Total CHOL/HDL Ratio: 4.1 (calc) (ref <5.0)

## 2017-09-14 LAB — TSH: TSH: 1.3 m[IU]/L (ref 0.40–4.50)

## 2017-09-14 LAB — HEMOGLOBIN A1C
Hgb A1c MFr Bld: 7.5 % of total Hgb — ABNORMAL HIGH (ref <5.7)
Mean Plasma Glucose: 169 (calc)
eAG (mmol/L): 9.3 (calc)

## 2017-09-14 LAB — BASIC METABOLIC PANEL WITH GFR
BUN: 16 mg/dL (ref 7–25)
CO2: 28 mmol/L (ref 20–32)
CREATININE: 1.03 mg/dL (ref 0.70–1.25)
Calcium: 9.2 mg/dL (ref 8.6–10.3)
Chloride: 104 mmol/L (ref 98–110)
GFR, EST AFRICAN AMERICAN: 91 mL/min/{1.73_m2} (ref >=60)
GFR, EST NON AFRICAN AMERICAN: 79 mL/min/{1.73_m2} (ref >=60)
Glucose, Bld: 157 mg/dL — ABNORMAL HIGH (ref 65–99)
POTASSIUM: 4.1 mmol/L (ref 3.5–5.3)
Sodium: 139 mmol/L (ref 135–146)

## 2017-09-14 LAB — TESTOSTERONE: Testosterone: 344 ng/dL (ref 250–827)

## 2017-09-14 LAB — PSA: PSA: 0.6 ng/mL (ref <=4.0)

## 2017-09-14 LAB — MICROALBUMIN / CREATININE URINE RATIO
CREATININE, URINE: 70 mg/dL (ref 20–320)
MICROALB UR: 0.3 mg/dL
Microalb Creat Ratio: 4 mcg/mg creat (ref <30)

## 2017-09-14 LAB — INSULIN, RANDOM: Insulin: 37.5 u[IU]/mL — ABNORMAL HIGH (ref 2.0–19.6)

## 2017-09-14 LAB — IRON, TOTAL/TOTAL IRON BINDING CAP
%SAT: 42 % (ref 15–60)
IRON: 144 ug/dL (ref 50–180)
TIBC: 346 ug/dL (ref 250–425)

## 2017-09-14 LAB — MAGNESIUM: Magnesium: 2 mg/dL (ref 1.5–2.5)

## 2017-09-16 ENCOUNTER — Other Ambulatory Visit: Payer: Self-pay | Admitting: Internal Medicine

## 2017-09-30 ENCOUNTER — Ambulatory Visit (AMBULATORY_SURGERY_CENTER): Payer: Self-pay | Admitting: *Deleted

## 2017-09-30 ENCOUNTER — Other Ambulatory Visit: Payer: Self-pay

## 2017-09-30 VITALS — Ht 70.0 in | Wt 215.0 lb

## 2017-09-30 DIAGNOSIS — Z1211 Encounter for screening for malignant neoplasm of colon: Secondary | ICD-10-CM

## 2017-09-30 MED ORDER — NA SULFATE-K SULFATE-MG SULF 17.5-3.13-1.6 GM/177ML PO SOLN: 1.0000 [IU] | Freq: Once | ORAL | 0 refills | Status: AC

## 2017-09-30 NOTE — Progress Notes (Signed)
No egg or soy allergy known to patient  No issues with past sedation with any surgeries  or procedures, no intubation problems  No diet pills per patient No home 02 use per patient  No blood thinners per patient  Pt denies issues with constipation  No A fib or A flutter  EMMI video sent to pt's e mail pt declined   

## 2017-10-08 ENCOUNTER — Telehealth: Payer: Self-pay | Admitting: Gastroenterology

## 2017-10-08 NOTE — Telephone Encounter (Signed)
Returned phone call to patient, insurance does not cover prep, cost is 120.00. Explained that most insurance does not cover preps, regardless. Offered him a sample but he said he would make it work.

## 2017-10-08 NOTE — Telephone Encounter (Signed)
Returned phone call to patient, no answer, left message to call [phone].

## 2017-10-15 ENCOUNTER — Encounter: Payer: Self-pay | Admitting: Gastroenterology

## 2017-10-16 ENCOUNTER — Other Ambulatory Visit: Payer: Self-pay | Admitting: Internal Medicine

## 2017-10-24 ENCOUNTER — Encounter: Payer: PRIVATE HEALTH INSURANCE | Admitting: Gastroenterology

## 2017-12-17 DIAGNOSIS — N182 Chronic kidney disease, stage 2 (mild): Secondary | ICD-10-CM | POA: Insufficient documentation

## 2017-12-17 DIAGNOSIS — E1122 Type 2 diabetes mellitus with diabetic chronic kidney disease: Secondary | ICD-10-CM | POA: Insufficient documentation

## 2017-12-17 NOTE — Progress Notes (Signed)
FOLLOW UP  Assessment and Plan:   Hypertension Well controlled with current medications  Monitor blood pressure at home; patient to call if consistently greater than 130/80 Continue DASH diet.   Reminder to go to the ER if any CP, SOB, nausea, dizziness, severe HA, changes vision/speech, left arm numbness and tingling and jaw pain.  Cholesterol Currently near LDL goal; continue statin; discussed diet for triglycerides Continue low cholesterol diet and exercise.  Check lipid panel.   Diabetes with diabetic chronic kidney disease Continue medication: continue metformin - increase from 3 tabs to 4 tabs if tolerated -  Continue diet and exercise.  Perform daily foot/skin check, notify office of any concerning changes.  Check A1C  Overweight Long discussion about weight loss, diet, and exercise Recommended diet heavy in fruits and veggies and low in animal meats, cheeses, and dairy products, appropriate calorie intake Discussed ideal weight for height Patient will work on continue with portion control and start more intentional exercise Will follow up in 3 months  Vitamin D Def At goal at last visit; continue supplementation to maintain goal of 70-100 Defer Vit D level  Screening colon cancer Overdue 2 years; discussed cologuard Colonoscopy- patient declines a colonoscopy due to cost even though the risks and benefits were discussed at length. Colon cancer is 3rd most diagnosed cancer and 2nd leading cause of death in both men and women 81 years of age and older. Patient understands the risk of cancer and death with declining the test however they are willing to do cologuard screening instead. They understand that this is not as sensitive or specific as a colonoscopy and they are still recommended to get a colonoscopy. The cologuard will be sent out to their house.    Continue diet and meds as discussed. Further disposition pending results of labs. Discussed med's effects and SE's.    Over 30 minutes of exam, counseling, chart review, and critical decision making was performed.   Future Appointments  Date Time Provider [place]  03/19/2018  9:30 AM [name], MD [facility] None  10/13/2018  2:00 PM [name], MD [facility] None    ----------------------------------------------------------------------------------------------------------------------  HPI 62 y.o. male  presents for 3 month follow up on hypertension, cholesterol, T2 diabetes, obesity and vitamin D deficiency. He is overdue for colon cancer screening and wants to discuss cologuard today.   BMI is Body mass index is 29.56 kg/m., he has been watching portions, but no other particular interventions, no current intentional exercise. He does walk his dogs a few times a week for 30 min at the time.  Wt Readings from Last 3 Encounters:  12/18/17 206 lb (93.4 kg)  09/30/17 215 lb (97.5 kg)  09/11/17 213 lb 6.4 oz (96.8 kg)   His blood pressure has been controlled at home, today their BP is BP: 108/74  He does not workout. He denies chest pain, shortness of breath, dizziness.   He is on cholesterol medication and denies myalgias. His cholesterol is not at goal. The cholesterol last visit was:   Lab Results  Component Value Date   CHOL 157 09/11/2017   HDL 38 (L) 09/11/2017   LDLCALC 78 06/11/2017   TRIG 157 (H) 09/11/2017   CHOLHDL 4.1 09/11/2017    He has not been working on diet and exercise for T2 diabetes, and denies increased appetite, nausea, paresthesia of the feet, polydipsia, polyuria, visual disturbances, vomiting and weight loss. He has not been able to tolerate 2000 mg of metformin weekly, is  taking 1000-1500 mg daily. Last A1C in the office was:  Lab Results  Component Value Date   HGBA1C 7.5 (H) 09/11/2017   Patient is on Vitamin D supplement and near goal at recent check:    Lab Results  Component Value Date   VD25OH 62 09/11/2017     He has a history of  testosterone deficiency and is on testosterone replacement. He states that the testosterone helps with his energy, libido, muscle mass. Lab Results  Component Value Date   TESTOSTERONE 344 09/11/2017     Current Medications:  Current Outpatient Medications on File Prior to Visit  Medication Sig  . aspirin 81 MG tablet Take 81 mg by mouth 2 (two) times daily.   . B-D 3CC LUER-LOK SYR 23GX1" 23G X 1" 3 ML MISC See admin instructions.  . Cholecalciferol (VITAMIN D PO) Take 10,000 Units daily by mouth.   . Cinnamon 500 MG capsule Take 500 mg daily by mouth.  . gabapentin (NEURONTIN) 100 MG capsule TAKE 1 CAPSULE BY MOUTH THREE TIMES A DAY  . hyoscyamine (LEVSIN SL) 0.125 MG SL tablet DISSOLVE ONE TABLET UNDER THE TONGUE 4 TIMES DAILY UP TO EVERY 4 HOURS AS NEEDED FOR NAUSEA, BLOATING, CRAMPING, OR DIARRHEA  . metFORMIN (GLUCOPHAGE-XR) 500 MG 24 hr tablet TAKE ONE TABLET BY MOUTH THREE TIMES DAILY WITH FOOD FOR DIABETES  . RED YEAST RICE EXTRACT PO Take 1 capsule by mouth.  . simvastatin (ZOCOR) 80 MG tablet TAKE 1 TABLET (80 MG TOTAL) BY MOUTH DAILY.  . SYRINGE-NEEDLE, DISP, 3 ML 21G X 1" 3 ML MISC As directed  . testosterone cypionate (DEPOTESTOSTERONE CYPIONATE) 200 MG/ML injection INJECT 2ML INTRAMUSCULARLY EVERY 2 WEEKS AS DIRECTED   No current facility-administered medications on file prior to visit.      Allergies:  Allergies  Allergen Reactions  . Lipitor [Atorvastatin]     fatigue     Medical History:  Past Medical History:  Diagnosis Date  . Abnormal EKG   . DM (diabetes mellitus) ([place])   . Hyperlipidemia   . Hypogonadism male   . IBS (irritable bowel syndrome)   . Obesity    BMI 31  . Vitamin D deficiency    Family history- Reviewed and unchanged Social history- Reviewed and unchanged   Review of Systems:  Review of Systems  Constitutional: Negative for malaise/fatigue and weight loss.  HENT: Negative for hearing loss and tinnitus.   Eyes: Negative for  blurred vision and double vision.  Respiratory: Negative for cough, shortness of breath and wheezing.   Cardiovascular: Negative for chest pain, palpitations, orthopnea, claudication and leg swelling.  Gastrointestinal: Negative for abdominal pain, blood in stool, constipation, diarrhea, heartburn, melena, nausea and vomiting.  Genitourinary: Negative.   Musculoskeletal: Negative for joint pain and myalgias.  Skin: Negative for rash.  Neurological: Negative for dizziness, tingling, sensory change, weakness and headaches.  Endo/Heme/Allergies: Negative for polydipsia.  Psychiatric/Behavioral: Negative.   All other systems reviewed and are negative.     Physical Exam: BP 108/74   Pulse 79   Temp 97.7 F (36.5 C)   Ht 5\' 10"  (1.778 m)   Wt 206 lb (93.4 kg)   SpO2 97%   BMI 29.56 kg/m  Wt Readings from Last 3 Encounters:  12/18/17 206 lb (93.4 kg)  09/30/17 215 lb (97.5 kg)  09/11/17 213 lb 6.4 oz (96.8 kg)   General Appearance: Well nourished, in no apparent distress. Eyes: PERRLA, EOMs, conjunctiva no swelling or erythema Sinuses: No  Frontal/maxillary tenderness ENT/Mouth: Ext aud canals clear, TMs without erythema, bulging. No erythema, swelling, or exudate on post pharynx.  Tonsils not swollen or erythematous. Hearing normal.  Neck: Supple, thyroid normal.  Respiratory: Respiratory effort normal, BS equal bilaterally without rales, rhonchi, wheezing or stridor.  Cardio: RRR with no MRGs. Brisk peripheral pulses without edema.  Abdomen: Soft, + BS.  Non tender, no guarding, rebound, hernias, masses. Lymphatics: Non tender without lymphadenopathy.  Musculoskeletal: Full ROM, 5/5 strength, Normal gait Skin: Warm, dry without rashes, lesions, ecchymosis.  Neuro: Cranial nerves intact. No cerebellar symptoms.  Psych: Awake and oriented X 3, normal affect, Insight and Judgment appropriate.    Izora Ribas, NP 8:53 AM [facility]

## 2017-12-18 ENCOUNTER — Ambulatory Visit: Payer: BLUE CROSS/BLUE SHIELD | Admitting: Adult Health

## 2017-12-18 ENCOUNTER — Encounter: Payer: Self-pay | Admitting: Adult Health

## 2017-12-18 VITALS — BP 108/74 | HR 79 | Temp 97.7°F | Ht 70.0 in | Wt 206.0 lb

## 2017-12-18 DIAGNOSIS — Z6830 Body mass index (BMI) 30.0-30.9, adult: Secondary | ICD-10-CM

## 2017-12-18 DIAGNOSIS — E1122 Type 2 diabetes mellitus with diabetic chronic kidney disease: Secondary | ICD-10-CM

## 2017-12-18 DIAGNOSIS — N182 Chronic kidney disease, stage 2 (mild): Secondary | ICD-10-CM | POA: Diagnosis not present

## 2017-12-18 DIAGNOSIS — E559 Vitamin D deficiency, unspecified: Secondary | ICD-10-CM | POA: Diagnosis not present

## 2017-12-18 DIAGNOSIS — E6609 Other obesity due to excess calories: Secondary | ICD-10-CM

## 2017-12-18 DIAGNOSIS — I10 Essential (primary) hypertension: Secondary | ICD-10-CM | POA: Diagnosis not present

## 2017-12-18 DIAGNOSIS — E782 Mixed hyperlipidemia: Secondary | ICD-10-CM

## 2017-12-18 DIAGNOSIS — Z1211 Encounter for screening for malignant neoplasm of colon: Secondary | ICD-10-CM

## 2017-12-18 DIAGNOSIS — Z79899 Other long term (current) drug therapy: Secondary | ICD-10-CM | POA: Diagnosis not present

## 2017-12-18 MED ORDER — HYOSCYAMINE SULFATE 0.125 MG SL SUBL: SUBLINGUAL_TABLET | SUBLINGUAL | 0 refills | Status: DC

## 2017-12-18 NOTE — Patient Instructions (Signed)
Cologuard is an easy to use noninvasive colon cancer screening test based on the latest advances in stool DNA science.   Colon cancer is 3rd most diagnosed cancer and 2nd leading cause of death in both men and women 62 years of age and older despite being one of the most preventable and treatable cancers if found early.  4 of out 5 people diagnosed with colon cancer have NO prior family history.  When caught EARLY 90% of colon cancer is curable.   You have agreed to do a Cologuard screening and have declined a colonoscopy in spite of being explained the risks and benefits of the colonoscopy in detail, including cancer and death. Please understand that this is test not as sensitive or specific as a colonoscopy and you are still recommended to get a colonoscopy.   If you are NOT medicare please call your insurance company and give them these items to see if they will cover it: 1) CPT code, [phone] 2) Provider is Probation officer 3) [employer] NPI [id number] 4) [place] Tax ID [phone]  Out-of-pocket cost for Cologuard can range from $0 - $649 so please call  You will receive a short call from [place] support center at [employer], when you receive a call they will say they are from [place],  to confirm your mailing address and give you more information.  When they calll you, it will appear on the caller ID as "[employer]" or in some cases only this number will appear, [phone].   [employer] will ship your collection kit directly to you. You will collect a single stool sample in the privacy of your own home, no special preparation required. You will return the kit via [place] pre-paid shipping or pick-up, in the same box it arrived in. Then I will contact you to discuss your results after I receive them from the laboratory.   If you have any questions or concerns, Cologuard Customer Support Specialist are available 24 hours a  day, 7 days a week at [phone] or go to [email or web address].     Here is some information to help you keep your heart healthy: Move it! - Aim for 30 mins of activity every day. Take it slowly at first. Talk to [place] before starting any new exercise program.   Lose it.  -Body Mass Index (BMI) can indicate if you need to lose weight. A healthy range is 18.5-24.9. For a BMI calculator, go to [email or web address].[email or web address]  Waist Management -Excess abdominal fat is a risk factor for heart disease, diabetes, asthma, stroke and more. Ideal waist circumference is less than 35" for women and less than 40" for men.   Eat Right -focus on fruits, vegetables, whole grains, and meals you make yourself. Avoid foods with trans fat and high sugar/sodium content.   Snooze or Snore? - Loud snoring can be a sign of sleep apnea, a significant risk factor for high blood pressure, heart attach, stroke, and heart arrhythmias.  Kick the habit -Quit Smoking! Avoid second hand smoke. A single cigarette raises your blood pressure for 20 mins and increases the risk of heart attack and stroke for the next 24 hours.   Are Aspirin and Supplements right for you? -Add ENTERIC COATED low dose 81 mg Aspirin daily OR can do every other day if you have easy bruising to protect your heart and head. As well as to reduce risk of Colon Cancer by 20 %, Skin Cancer by 26 % ,  Melanoma by 46% and Pancreatic cancer by 60%  Say "No to Stress -There may be little you can do about problems that cause stress. However, techniques such as long walks, meditation, and exercise can help you manage it.   Start Now! - Make changes one at a time and set reasonable goals to increase your likelihood of success.

## 2017-12-19 ENCOUNTER — Other Ambulatory Visit: Payer: Self-pay

## 2017-12-19 DIAGNOSIS — E1122 Type 2 diabetes mellitus with diabetic chronic kidney disease: Secondary | ICD-10-CM

## 2017-12-19 DIAGNOSIS — N182 Chronic kidney disease, stage 2 (mild): Secondary | ICD-10-CM

## 2017-12-19 LAB — BASIC METABOLIC PANEL WITH GFR
BUN: 16 mg/dL (ref 7–25)
CALCIUM: 9.9 mg/dL (ref 8.6–10.3)
CHLORIDE: 100 mmol/L (ref 98–110)
CO2: 27 mmol/L (ref 20–32)
Creat: 0.9 mg/dL (ref 0.70–1.25)
GFR, EST NON AFRICAN AMERICAN: 92 mL/min/{1.73_m2} (ref >=60)
GFR, Est African American: 106 mL/min/{1.73_m2} (ref >=60)
Glucose, Bld: 233 mg/dL — ABNORMAL HIGH (ref 65–99)
POTASSIUM: 4.4 mmol/L (ref 3.5–5.3)
SODIUM: 137 mmol/L (ref 135–146)

## 2017-12-19 LAB — CBC WITH DIFFERENTIAL/PLATELET
Basophils Absolute: 30 cells/uL (ref 0–200)
Basophils Relative: 0.5 %
Eosinophils Absolute: 118 cells/uL (ref 15–500)
Eosinophils Relative: 2 %
HEMATOCRIT: 49.5 % (ref 38.5–50.0)
Hemoglobin: 17.1 g/dL (ref 13.2–17.1)
LYMPHS ABS: 1888 {cells}/uL (ref 850–3900)
MCH: 31.1 pg (ref 27.0–33.0)
MCHC: 34.5 g/dL (ref 32.0–36.0)
MCV: 90.2 fL (ref 80.0–100.0)
MONOS PCT: 6.9 %
MPV: 10 fL (ref 7.5–12.5)
NEUTROS PCT: 58.6 %
Neutro Abs: 3457 cells/uL (ref 1500–7800)
PLATELETS: 189 10*3/uL (ref 140–400)
RBC: 5.49 10*6/uL (ref 4.20–5.80)
RDW: 12 % (ref 11.0–15.0)
TOTAL LYMPHOCYTE: 32 %
WBC mixed population: 407 cells/uL (ref 200–950)
WBC: 5.9 10*3/uL (ref 3.8–10.8)

## 2017-12-19 LAB — HEPATIC FUNCTION PANEL
AG Ratio: 1.6 (calc) (ref 1.0–2.5)
ALKALINE PHOSPHATASE (APISO): 75 U/L (ref 40–115)
ALT: 21 U/L (ref 9–46)
AST: 16 U/L (ref 10–35)
Albumin: 4.6 g/dL (ref 3.6–5.1)
BILIRUBIN DIRECT: 0.2 mg/dL (ref 0.0–0.2)
BILIRUBIN TOTAL: 0.9 mg/dL (ref 0.2–1.2)
Globulin: 2.8 g/dL (calc) (ref 1.9–3.7)
Indirect Bilirubin: 0.7 mg/dL (calc) (ref 0.2–1.2)
Total Protein: 7.4 g/dL (ref 6.1–8.1)

## 2017-12-19 LAB — LIPID PANEL
CHOLESTEROL: 171 mg/dL (ref <200)
HDL: 44 mg/dL (ref >40)
LDL Cholesterol (Calc): 102 mg/dL (calc) — ABNORMAL HIGH
Non-HDL Cholesterol (Calc): 127 mg/dL (calc) (ref <130)
Total CHOL/HDL Ratio: 3.9 (calc) (ref <5.0)
Triglycerides: 145 mg/dL (ref <150)

## 2017-12-19 LAB — HEMOGLOBIN A1C
EAG (MMOL/L): 12.2 (calc)
HEMOGLOBIN A1C: 9.3 %{Hb} — AB (ref <5.7)
Mean Plasma Glucose: 220 (calc)

## 2017-12-19 LAB — TSH: TSH: 1.54 m[IU]/L (ref 0.40–4.50)

## 2017-12-19 MED ORDER — GLUCOSE BLOOD VI STRP: ORAL_STRIP | 12 refills | Status: DC

## 2018-01-26 NOTE — Progress Notes (Signed)
Diabetes Education and Follow-Up Visit  62 y.o.male presents for diabetic education. 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 He reports he made a drastic change in his diet, has cut out all sugar, flour, pasta, white potatoes - he has been eating lean proteins and salad, eats peanut butter/apple at night. He estimates progress as previous diet being "negative 8" and current diet "positive 7-8" on a -10 to +10 scale. Had an extended discussion about physiology, risks and goals of care; patient is motivated to fully reverse/control with goal of getting off of medications permanently. He has a fair level of confidence to achieve this.   Needs eye exam for this year - will make appointment today, requested report be forwarded.   Last hemoglobin A1c was: Lab Results  Component Value Date   HGBA1C 9.3 (H) 12/18/2017   HGBA1C 7.5 (H) 09/11/2017   HGBA1C 7.5 (H) 06/11/2017   BMI is Body mass index is 29.7 kg/m., he has been working on diet and exercise. Wt Readings from Last 3 Encounters:  01/27/18 207 lb (93.9 kg)  12/18/17 206 lb (93.4 kg)  09/30/17 215 lb (97.5 kg)   Pt is on a regimen of: Metformin 500 mg x 4 tabs daily  Pt checks his sugars 4 x day  Lowest sugar was 90.  He is aware of hypoglycemia symptoms and can demonstrate this today, denies any episodes.  Highest sugar was 330 (after a meal) Average fasting - 170-180  Glucometer:  ?  Exercise: Walks dogs daily - 25 min 5 days a week  Patient does have CKD (recent GFR up from previous) He is not on ACE/ARB - BPs normal, would like to postpone ACE after   Lab Results  Component Value Date   GFRNONAA 92 12/18/2017   Lab Results  Component Value Date   CREATININE 0.90 12/18/2017   BUN 16 12/18/2017   NA 137  12/18/2017   K 4.4 12/18/2017   CL 100 12/18/2017   CO2 27 12/18/2017    Lab Results  Component Value Date   MICROALBUR 0.3 09/11/2017   He is on a Statin.  He is not at goal of less than 70.  Lab Results  Component Value Date   CHOL 171 12/18/2017   HDL 44 12/18/2017   LDLCALC 78 06/11/2017   TRIG 145 12/18/2017   CHOLHDL 3.9 12/18/2017    Problem List has Hyperlipidemia, mixed; Essential hypertension; Testosterone Deficiency; Obesity; Medication management; Vitamin D deficiency; Type 2 diabetes mellitus, controlled, with renal complications ([place]); and CKD stage 2 due to type 2 diabetes mellitus ([place]) on their problem list.  Medications Current Outpatient Medications on File Prior to Visit  Medication Sig  . APPLE CIDER VINEGAR PO Take by mouth.  Mar[place] Kitchen aspirin 81 MG tablet Take 81 mg by mouth 2 (two) times daily.   . B-D 3CC LUER-LOK SYR 23GX1" 23G X 1" 3 ML MISC See admin instructions.  . Cholecalciferol (VITAMIN D PO) Take 10,000 Units daily by mouth.   . Cinnamon 500 MG capsule Take 500 mg daily by mouth.  . gabapentin (NEURONTIN) 100 MG capsule TAKE 1 CAPSULE BY MOUTH THREE TIMES A DAY  . glucose blood test strip Use as instructed  . hyoscyamine (LEVSIN SL) 0.125 MG SL tablet DISSOLVE ONE TABLET UNDER THE TONGUE 4 TIMES DAILY  UP TO EVERY 4 HOURS AS NEEDED FOR NAUSEA, BLOATING, CRAMPING, OR DIARRHEA  . metFORMIN (GLUCOPHAGE-XR) 500 MG 24 hr tablet TAKE ONE TABLET BY MOUTH THREE TIMES DAILY WITH FOOD FOR DIABETES (Patient taking differently: TAKE ONE TABLET BY MOUTH FOUR TIMES DAILY WITH FOOD FOR DIABETES)  . RED YEAST RICE EXTRACT PO Take 1 capsule by mouth.  . simvastatin (ZOCOR) 80 MG tablet TAKE 1 TABLET (80 MG TOTAL) BY MOUTH DAILY.  . SYRINGE-NEEDLE, DISP, 3 ML 21G X 1" 3 ML MISC As directed  . testosterone cypionate (DEPOTESTOSTERONE CYPIONATE) 200 MG/ML injection INJECT 2ML INTRAMUSCULARLY EVERY 2 WEEKS AS DIRECTED   No current facility-administered medications on file  prior to visit.     ROS- see HPI  Physical Exam: Blood pressure 132/82, pulse 70, temperature (!) 97.5 F (36.4 C), height 5\' 10"  (1.778 m), weight 207 lb (93.9 kg), SpO2 97 %. Body mass index is 29.7 kg/m. General Appearance: Well nourished, in no apparent distress. Eyes: PERRLA, EOMs, conjunctiva no swelling or erythema ENT/Mouth: Ext aud canals clear, TMs without erythema, bulging. No erythema, swelling, or exudate on post pharynx.  Tonsils not swollen or erythematous. Hearing normal.  Respiratory: Respiratory effort normal, BS equal bilaterally without rales, rhonchi, wheezing or stridor.  Cardio: RRR with no MRGs. Brisk peripheral pulses without edema.  Abdomen: Soft, + BS.  Non tender, no guarding, rebound, hernias, masses. Musculoskeletal: Full ROM, 5/5 strength, normal gait.  Skin: Warm, dry without rashes, lesions, ecchymosis.  Neuro: Cranial nerves intact. Normal muscle tone, no cerebellar symptoms. Sensation intact.    Plan and Assessment: Diabetes Education: Reviewed 'ABCs' of diabetes management (respective goals in parentheses):  A1C (<7), blood pressure (<130/80), and cholesterol (LDL <70) Eye Exam yearly and Dental Exam every 6 months- will schedule today Foot exam done today Doing well, blood glucoses mproving drastically - will postpone adding medication at this time, patient goal is full reversal and getting off of medication Dietary recommendations reviewed Physical Activity recommendations reviewed - start walking daily - Strongly advised him to start checking sugars at different times of the day - check 2 times a day, rotating checks - given sugar log and advised how to fill it and to bring it at next appt  - given foot care handout and explained the principles  - given instructions for hypoglycemia management   Over 50 minutes spent on counseling, assessment and chart review with this patient. Majority was with the patient on counseling.   Future  Appointments  Date Time Provider [place]  03/19/2018  9:30 AM [name], MD [facility] None  10/13/2018  2:00 PM [name], MD [facility] None

## 2018-01-27 ENCOUNTER — Encounter: Payer: Self-pay | Admitting: Adult Health

## 2018-01-27 ENCOUNTER — Ambulatory Visit (INDEPENDENT_AMBULATORY_CARE_PROVIDER_SITE_OTHER): Payer: BLUE CROSS/BLUE SHIELD | Admitting: Adult Health

## 2018-01-27 VITALS — BP 132/82 | HR 70 | Temp 97.5°F | Ht 70.0 in | Wt 207.0 lb

## 2018-01-27 DIAGNOSIS — E6609 Other obesity due to excess calories: Secondary | ICD-10-CM | POA: Diagnosis not present

## 2018-01-27 DIAGNOSIS — E1122 Type 2 diabetes mellitus with diabetic chronic kidney disease: Secondary | ICD-10-CM | POA: Diagnosis not present

## 2018-01-27 DIAGNOSIS — Z6830 Body mass index (BMI) 30.0-30.9, adult: Secondary | ICD-10-CM

## 2018-01-27 DIAGNOSIS — I10 Essential (primary) hypertension: Secondary | ICD-10-CM | POA: Diagnosis not present

## 2018-01-27 DIAGNOSIS — E782 Mixed hyperlipidemia: Secondary | ICD-10-CM

## 2018-01-27 DIAGNOSIS — N182 Chronic kidney disease, stage 2 (mild): Secondary | ICD-10-CM

## 2018-01-27 NOTE — Patient Instructions (Signed)
Diabetes Mellitus and Standards of Medical Care Managing diabetes (diabetes mellitus) can be complicated. Your diabetes treatment may be managed by a team of health care providers, including:  A diet and nutrition specialist (registered dietitian).  A nurse.  A certified diabetes educator (CDE).  A diabetes specialist (endocrinologist).  An eye doctor.  A primary care provider.  A dentist.  Your health care providers follow a schedule in order to help you get the best quality of care. The following schedule is a general guideline for your diabetes management plan. Your health care providers may also give you more specific instructions. HbA1c ( hemoglobin A1c) test This test provides information about blood sugar (glucose) control over the previous 2-3 months. It is used to check whether your diabetes management plan needs to be adjusted.  If you are meeting your treatment goals, this test is done at least 2 times a year.  If you are not meeting treatment goals or if your treatment goals have changed, this test is done 4 times a year.  Blood pressure test  This test is done at every routine medical visit. For most people, the goal is less than 130/80. Ask your health care provider what your goal blood pressure should be. Dental and eye exams  Visit your dentist two times a year.  If you have type 1 diabetes, get an eye exam 3-5 years after you are diagnosed, and then once a year after your first exam. ? If you were diagnosed with type 1 diabetes as a child, get an eye exam when you are age 16 or older and have had diabetes for 3-5 years. After the first exam, you should get an eye exam once a year.  If you have type 2 diabetes, have an eye exam as soon as you are diagnosed, and then once a year after your first exam. Foot care exam  Visual foot exams are done at every routine medical visit. The exams check for cuts, bruises, redness, blisters, sores, or other problems with the  feet.  A complete foot exam is done by your health care provider once a year. This exam includes an inspection of the structure and skin of your feet, and a check of the pulses and sensation in your feet. ? Type 1 diabetes: Get your first exam 3-5 years after diagnosis. ? Type 2 diabetes: Get your first exam as soon as you are diagnosed.  Check your feet every day for cuts, bruises, redness, blisters, or sores. If you have any of these or other problems that are not healing, contact your health care provider. Kidney function test ( urine microalbumin)  This test is done once a year. ? Type 1 diabetes: Get your first test 5 years after diagnosis. ? Type 2 diabetes: Get your first test as soon as you are diagnosed.  If you have chronic kidney disease (CKD), get a serum creatinine and estimated glomerular filtration rate (eGFR) test once a year. Lipid profile (cholesterol, HDL, LDL, triglycerides)  This test should be done when you are diagnosed with diabetes, and every 5 years after the first test. If you are on medicines to lower your cholesterol, you may need to get this test done every year. ? The goal for LDL is less than 100 mg/dL (5.5 mmol/L). If you are at high risk, the goal is less than 70 mg/dL (3.9 mmol/L). ? The goal for HDL is 40 mg/dL (2.2 mmol/L) for men and 50 mg/dL(2.8 mmol/L) for women. An HDL  cholesterol of 60 mg/dL (3.3 mmol/L) or higher gives some protection against heart disease. ? The goal for triglycerides is less than 150 mg/dL (8.3 mmol/L). Immunizations  The yearly flu (influenza) vaccine is recommended for everyone 6 months or older who has diabetes.  The pneumonia (pneumococcal) vaccine is recommended for everyone 2 years or older who has diabetes. If you are 97 or older, you may get the pneumonia vaccine as a series of two separate shots.  The hepatitis B vaccine is recommended for adults shortly after they have been diagnosed with diabetes.  The Tdap  (tetanus, diphtheria, and pertussis) vaccine should be given: ? According to normal childhood vaccination schedules, for children. ? Every 10 years, for adults who have diabetes.  The shingles vaccine is recommended for people who have had chicken pox and are 50 years or older. Mental and emotional health  Screening for symptoms of eating disorders, anxiety, and depression is recommended at the time of diagnosis and afterward as needed. If your screening shows that you have symptoms (you have a positive screening result), you may need further evaluation and be referred to a mental health care provider. Diabetes self-management education  Education about how to manage your diabetes is recommended at diagnosis and ongoing as needed. Treatment plan  Your treatment plan will be reviewed at every medical visit. Summary  Managing diabetes (diabetes mellitus) can be complicated. Your diabetes treatment may be managed by a team of health care providers.  Your health care providers follow a schedule in order to help you get the best quality of care.  Standards of care including having regular physical exams, blood tests, blood pressure monitoring, immunizations, screening tests, and education about how to manage your diabetes.  Your health care providers may also give you more specific instructions based on your individual health. This information is not intended to replace advice given to you by your health care provider. Make sure you discuss any questions you have with your health care provider. Document Released: 09/01/2009 Document Revised: 08/02/2016 Document Reviewed: 08/02/2016 Elsevier Interactive Patient Education  [employer].

## 2018-02-06 LAB — COLOGUARD: Cologuard: NEGATIVE

## 2018-02-09 ENCOUNTER — Other Ambulatory Visit: Payer: Self-pay | Admitting: Physician Assistant

## 2018-02-09 DIAGNOSIS — E291 Testicular hypofunction: Secondary | ICD-10-CM

## 2018-02-12 ENCOUNTER — Encounter: Payer: Self-pay | Admitting: Adult Health

## 2018-02-12 ENCOUNTER — Telehealth: Payer: Self-pay

## 2018-02-12 ENCOUNTER — Other Ambulatory Visit: Payer: Self-pay | Admitting: Adult Health

## 2018-02-12 DIAGNOSIS — R6889 Other general symptoms and signs: Secondary | ICD-10-CM

## 2018-02-12 LAB — COLOGUARD

## 2018-02-12 MED ORDER — PROMETHAZINE-DM 6.25-15 MG/5ML PO SYRP: 5.0000 mL | ORAL_SOLUTION | Freq: Four times a day (QID) | ORAL | 1 refills | Status: DC | PRN

## 2018-02-12 MED ORDER — OSELTAMIVIR PHOSPHATE 75 MG PO CAPS: 75.0000 mg | ORAL_CAPSULE | Freq: Two times a day (BID) | ORAL | 0 refills | Status: DC

## 2018-02-12 NOTE — Telephone Encounter (Signed)
Patient has a headache, body aches, congestion and cough since yesterday. Daughter was diagnosed with the flu on Monday. Going out of town tomorrow and not sure what he could take.

## 2018-02-12 NOTE — Telephone Encounter (Signed)
Patient notified

## 2018-02-14 ENCOUNTER — Other Ambulatory Visit: Payer: Self-pay | Admitting: Internal Medicine

## 2018-03-19 ENCOUNTER — Ambulatory Visit (INDEPENDENT_AMBULATORY_CARE_PROVIDER_SITE_OTHER): Payer: BLUE CROSS/BLUE SHIELD | Admitting: Internal Medicine

## 2018-03-19 ENCOUNTER — Encounter: Payer: Self-pay | Admitting: Internal Medicine

## 2018-03-19 VITALS — BP 110/70 | HR 60 | Temp 97.5°F | Ht 70.0 in | Wt 202.0 lb

## 2018-03-19 DIAGNOSIS — E291 Testicular hypofunction: Secondary | ICD-10-CM | POA: Diagnosis not present

## 2018-03-19 DIAGNOSIS — E782 Mixed hyperlipidemia: Secondary | ICD-10-CM

## 2018-03-19 DIAGNOSIS — E119 Type 2 diabetes mellitus without complications: Secondary | ICD-10-CM

## 2018-03-19 DIAGNOSIS — I10 Essential (primary) hypertension: Secondary | ICD-10-CM | POA: Diagnosis not present

## 2018-03-19 DIAGNOSIS — Z79899 Other long term (current) drug therapy: Secondary | ICD-10-CM

## 2018-03-19 DIAGNOSIS — Z8249 Family history of ischemic heart disease and other diseases of the circulatory system: Secondary | ICD-10-CM | POA: Diagnosis not present

## 2018-03-19 DIAGNOSIS — E559 Vitamin D deficiency, unspecified: Secondary | ICD-10-CM | POA: Diagnosis not present

## 2018-03-19 LAB — CBC WITH DIFFERENTIAL/PLATELET
BASOS PCT: 0.4 %
Basophils Absolute: 22 cells/uL (ref 0–200)
Eosinophils Absolute: 110 cells/uL (ref 15–500)
Eosinophils Relative: 2 %
HCT: 41.3 % (ref 38.5–50.0)
Hemoglobin: 14.5 g/dL (ref 13.2–17.1)
LYMPHS ABS: 1777 {cells}/uL (ref 850–3900)
MCH: 32.1 pg (ref 27.0–33.0)
MCHC: 35.1 g/dL (ref 32.0–36.0)
MCV: 91.4 fL (ref 80.0–100.0)
MPV: 10.1 fL (ref 7.5–12.5)
Monocytes Relative: 6.7 %
Neutro Abs: 3223 cells/uL (ref 1500–7800)
Neutrophils Relative %: 58.6 %
PLATELETS: 151 10*3/uL (ref 140–400)
RBC: 4.52 10*6/uL (ref 4.20–5.80)
RDW: 12 % (ref 11.0–15.0)
Total Lymphocyte: 32.3 %
WBC: 5.5 10*3/uL (ref 3.8–10.8)
WBCMIX: 369 {cells}/uL (ref 200–950)

## 2018-03-19 LAB — COMPLETE METABOLIC PANEL WITH GFR
AG RATIO: 2 (calc) (ref 1.0–2.5)
ALT: 18 U/L (ref 9–46)
AST: 18 U/L (ref 10–35)
Albumin: 4.6 g/dL (ref 3.6–5.1)
Alkaline phosphatase (APISO): 68 U/L (ref 40–115)
BUN: 13 mg/dL (ref 7–25)
CALCIUM: 9.7 mg/dL (ref 8.6–10.3)
CO2: 30 mmol/L (ref 20–32)
CREATININE: 0.93 mg/dL (ref 0.70–1.25)
Chloride: 103 mmol/L (ref 98–110)
GFR, EST AFRICAN AMERICAN: 102 mL/min/{1.73_m2} (ref >=60)
GFR, EST NON AFRICAN AMERICAN: 88 mL/min/{1.73_m2} (ref >=60)
Globulin: 2.3 g/dL (calc) (ref 1.9–3.7)
Glucose, Bld: 183 mg/dL — ABNORMAL HIGH (ref 65–99)
POTASSIUM: 4.2 mmol/L (ref 3.5–5.3)
Sodium: 140 mmol/L (ref 135–146)
TOTAL PROTEIN: 6.9 g/dL (ref 6.1–8.1)
Total Bilirubin: 0.6 mg/dL (ref 0.2–1.2)

## 2018-03-19 LAB — LIPID PANEL
CHOL/HDL RATIO: 2.7 (calc) (ref <5.0)
Cholesterol: 106 mg/dL (ref <200)
HDL: 40 mg/dL — AB (ref >40)
LDL CHOLESTEROL (CALC): 52 mg/dL
Non-HDL Cholesterol (Calc): 66 mg/dL (calc) (ref <130)
TRIGLYCERIDES: 67 mg/dL (ref <150)

## 2018-03-19 LAB — TSH: TSH: 1.32 m[IU]/L (ref 0.40–4.50)

## 2018-03-19 LAB — MAGNESIUM: Magnesium: 2 mg/dL (ref 1.5–2.5)

## 2018-03-19 NOTE — Patient Instructions (Signed)
Testosterone injection What is this medicine?  TESTOSTERONE (tes TOS ter one) is the main male hormone. It supports normal male development such as muscle growth, facial hair, and deep voice. It is used in males to treat low testosterone levels. This medicine may be used for other purposes; ask your health care provider or pharmacist if you have questions. COMMON BRAND NAME: Depo-Testosterone  What should I tell my health care provider before I take this medicine?  They need to know if you have any of these conditions: -cancer -diabetes -heart disease -kidney disease -liver disease -lung disease -prostate disease -an unusual or allergic reaction to testosterone, other medicines, foods, dyes, or preservatives  How should I use this medicine?  This medicine is for injection into a muscle. It is usually given by a health care professional in a hospital or clinic setting. Contact your pediatrician regarding the use of this medicine in children. While this medicine may be prescribed for children as young as 71 years of age for selected conditions, precautions do apply. Overdosage: If you think you have taken too much of this medicine contact a poison control center or emergency room at once. NOTE: This medicine is only for you. Do not share this medicine with others. What if I miss a dose? Try not to miss a dose. Your doctor or health care professional will tell you when your next injection is due. Notify the office if you are unable to keep an appointment.  What may interact with this medicine? -medicines for diabetes -medicines that treat or prevent blood clots like warfarin -oxyphenbutazone -propranolol -steroid medicines like prednisone or cortisone This list may not describe all possible interactions. Give your health care provider a list of all the medicines, herbs, non-prescription drugs, or dietary supplements you use. Also tell them if you smoke, drink alcohol, or use illegal  drugs. Some items may interact with your medicine.  What should I watch for while using this medicine? Visit your doctor or health care professional for regular checks on your progress. They will need to check the level of testosterone in your blood. This medicine is only approved for use in men who have low levels of testosterone related to certain medical conditions. Heart attacks and strokes have been reported with the use of this medicine. Notify your doctor or health care professional and seek emergency treatment if you develop breathing problems; changes in vision; confusion; chest pain or chest tightness; sudden arm pain; severe, sudden headache; trouble speaking or understanding; sudden numbness or weakness of the face, arm or leg; loss of balance or coordination. Talk to your doctor about the risks and benefits of this medicine. This medicine may affect blood sugar levels. If you have diabetes, check with your doctor or health care professional before you change your diet or the dose of your diabetic medicine. Testosterone injections are not commonly used in women. Women should inform their doctor if they wish to become pregnant or think they might be pregnant. There is a potential for serious side effects to an unborn child. Talk to your health care professional or pharmacist for more information. Talk with your doctor or health care professional about your birth control options while taking this medicine. This drug is banned from use in athletes by most athletic organizations.  What side effects may I notice from receiving this medicine? Side effects that you should report to your doctor or health care professional as soon as possible: -allergic reactions like skin rash, itching or hives,  swelling of the face, lips, or tongue -breast enlargement -breathing problems -changes in emotions or moods -deep or hoarse voice -irregular menstrual periods -signs and symptoms of liver injury like  dark yellow or brown urine; general ill feeling or flu-like symptoms; light-colored stools; loss of appetite; nausea; right upper belly pain; unusually weak or tired; yellowing of the eyes or skin -stomach pain -swelling of the ankles, feet, hands -too frequent or persistent erections -trouble passing urine or change in the amount of urine Side effects that usually do not require medical attention (report to your doctor or health care professional if they continue or are bothersome): -acne -change in sex drive or performance -facial hair growth -hair loss -headache  Where should I keep my medicine?  Keep out of the reach of children. This medicine can be abused. Keep your medicine in a safe place to protect it from theft. Do not share this medicine with anyone. Selling or giving away this medicine is dangerous and against the law. Store at room temperature between 20 and 25 degrees C (68 and 77 degrees F). Do not freeze. Protect from light. Follow the directions for the product you are prescribed. Throw away any unused medicine after the expiration date.   ++++++++++++++++++++++++++++++ Recommend Adult Low Dose Aspirin or  coated  Aspirin 81 mg daily  To reduce risk of Colon Cancer 20 %,  Skin Cancer 26 % ,  Melanoma 46%  and  Pancreatic cancer 60% +++++++++++++++++++++++++++++ Vitamin D goal  is between 70-100.  Please make sure that you are taking your Vitamin D as directed.  It is very important as a natural anti-inflammatory  helping hair, skin, and nails, as well as reducing stroke and heart attack risk.  It helps your bones and helps with mood. It also decreases numerous cancer risks so please take it as directed.  Low Vit D is associated with a 200-300% higher risk for CANCER  and 200-300% higher risk for HEART   ATTACK  &  STROKE.   .....................................Marland Kitchen It is also associated with higher death rate at younger ages,  autoimmune diseases like Rheumatoid  arthritis, Lupus, Multiple Sclerosis.    Also many other serious conditions, like depression, Alzheimer's Dementia, infertility, muscle aches, fatigue, fibromyalgia - just to name a few. ++++++++++++++++++++ Recommend the book "The END of DIETING" by Dr Excell Seltzer  & the book "The END of DIABETES " by Dr Excell Seltzer At [email or web address] - get book & Audio CD's    Being diabetic has a  300% increased risk for heart attack, stroke, cancer, and alzheimer- type vascular dementia. It is very important that you work harder with diet by avoiding all foods that are white. Avoid white rice (brown & wild rice is OK), white potatoes (sweetpotatoes in moderation is OK), White bread or wheat bread or anything made out of white flour like bagels, donuts, rolls, buns, biscuits, cakes, pastries, cookies, pizza crust, and pasta (made from white flour & egg whites) - vegetarian pasta or spinach or wheat pasta is OK. Multigrain breads like Arnold's or [employer], or multigrain sandwich thins or flatbreads.  Diet, exercise and weight loss can reverse and cure diabetes in the early stages.  Diet, exercise and weight loss is very important in the control and prevention of complications of diabetes which affects every system in your body, ie. Brain - dementia/stroke, eyes - glaucoma/blindness, heart - heart attack/heart failure, kidneys - dialysis, stomach - gastric paralysis, intestines - malabsorption, nerves - severe painful neuritis,  circulation - gangrene & loss of a leg(s), and finally cancer and Alzheimers.    I recommend avoid fried & greasy foods,  sweets/candy, white rice (brown or wild rice or Quinoa is OK), white potatoes (sweet potatoes are OK) - anything made from white flour - bagels, doughnuts, rolls, buns, biscuits,white and wheat breads, pizza crust and traditional pasta made of white flour & egg white(vegetarian pasta or spinach or wheat pasta is OK).  Multi-grain bread is OK - like multi-grain flat bread or  sandwich thins. Avoid alcohol in excess. Exercise is also important.    Eat all the vegetables you want - avoid meat, especially red meat and dairy - especially cheese.  Cheese is the most concentrated form of trans-fats which is the worst thing to clog up our arteries. Veggie cheese is OK which can be found in the fresh produce section at [employer] or [employer] or [employer]  +++++++++++++++++++++ DASH Eating Plan  DASH stands for "Dietary Approaches to Stop Hypertension."   The DASH eating plan is a healthy eating plan that has been shown to reduce high blood pressure (hypertension). Additional health benefits may include reducing the risk of type 2 diabetes mellitus, heart disease, and stroke. The DASH eating plan may also help with weight loss. WHAT DO I NEED TO KNOW ABOUT THE DASH EATING PLAN? For the DASH eating plan, you will follow these general guidelines:  Choose foods with a percent daily value for sodium of less than 5% (as listed on the food label).  Use salt-free seasonings or herbs instead of table salt or sea salt.  Check with your health care provider or pharmacist before using salt substitutes.  Eat lower-sodium products, often labeled as "lower sodium" or "no salt added."  Eat fresh foods.  Eat more vegetables, fruits, and low-fat dairy products.  Choose whole grains. Look for the word "whole" as the first word in the ingredient list.  Choose fish   Limit sweets, desserts, sugars, and sugary drinks.  Choose heart-healthy fats.  Eat veggie cheese   Eat more home-cooked food and less restaurant, buffet, and fast food.  Limit fried foods.  Cook foods using methods other than frying.  Limit canned vegetables. If you do use them, rinse them well to decrease the sodium.  When eating at a restaurant, ask that your food be prepared with less salt, or no salt if possible.                      WHAT FOODS CAN I EAT? Read Dr Fara Olden Fuhrman's books on The End of  Dieting & The End of Diabetes  Grains Whole grain or whole wheat bread. Brown rice. Whole grain or whole wheat pasta. Quinoa, bulgur, and whole grain cereals. Low-sodium cereals. Corn or whole wheat flour tortillas. Whole grain cornbread. Whole grain crackers. Low-sodium crackers.  Vegetables Fresh or frozen vegetables (raw, steamed, roasted, or grilled). Low-sodium or reduced-sodium tomato and vegetable juices. Low-sodium or reduced-sodium tomato sauce and paste. Low-sodium or reduced-sodium canned vegetables.   Fruits All fresh, canned (in natural juice), or frozen fruits.  Protein Products  All fish and seafood.  Dried beans, peas, or lentils. Unsalted nuts and seeds. Unsalted canned beans.  Dairy Low-fat dairy products, such as skim or 1% milk, 2% or reduced-fat cheeses, low-fat ricotta or cottage cheese, or plain low-fat yogurt. Low-sodium or reduced-sodium cheeses.  Fats and Oils Tub margarines without trans fats. Light or reduced-fat mayonnaise and salad dressings (reduced sodium).  Avocado. Safflower, olive, or canola oils. Natural peanut or almond butter.  Other Unsalted popcorn and pretzels. The items listed above may not be a complete list of recommended foods or beverages. Contact your dietitian for more options.  +++++++++++++++  WHAT FOODS ARE NOT RECOMMENDED? Grains/ White flour or wheat flour White bread. White pasta. White rice. Refined cornbread. Bagels and croissants. Crackers that contain trans fat.  Vegetables  Creamed or fried vegetables. Vegetables in a . Regular canned vegetables. Regular canned tomato sauce and paste. Regular tomato and vegetable juices.  Fruits Dried fruits. Canned fruit in light or heavy syrup. Fruit juice.  Meat and Other Protein Products Meat in general - RED meat & White meat.  Fatty cuts of meat. Ribs, chicken wings, all processed meats as bacon, sausage, bologna, salami, fatback, hot dogs, bratwurst and packaged luncheon  meats.  Dairy Whole or 2% milk, cream, half-and-half, and cream cheese. Whole-fat or sweetened yogurt. Full-fat cheeses or blue cheese. Non-dairy creamers and whipped toppings. Processed cheese, cheese spreads, or cheese curds.  Condiments Onion and garlic salt, seasoned salt, table salt, and sea salt. Canned and packaged gravies. Worcestershire sauce. Tartar sauce. Barbecue sauce. Teriyaki sauce. Soy sauce, including reduced sodium. Steak sauce. Fish sauce. Oyster sauce. Cocktail sauce. Horseradish. Ketchup and mustard. Meat flavorings and tenderizers. Bouillon cubes. Hot sauce. Tabasco sauce. Marinades. Taco seasonings. Relishes.  Fats and Oils Butter, stick margarine, lard, shortening and bacon fat. Coconut, palm kernel, or palm oils. Regular salad dressings.  Pickles and olives. Salted popcorn and pretzels.  The items listed above may not be a complete list of foods and beverages to avoid.

## 2018-03-19 NOTE — Progress Notes (Signed)
This very nice 62 y.o. MWM presents for 6 month follow up with HTN, HLD, T2_NIDDM and Vitamin D Deficiency.      Patient is treated for HTN (2013) & BP has been controlled at home. Today's BP is at goal - 110/70. Patient has had no complaints of any cardiac type chest pain, palpitations, dyspnea / orthopnea / PND, dizziness, claudication, or dependent edema.     Hyperlipidemia is controlled with diet & meds. Patient denies myalgias or other med SE's. Last Lipids were at goal with borderline LDL of 102: Lab Results  Component Value Date   CHOL 171 12/18/2017   HDL 44 12/18/2017   LDLCALC 102 (H) 12/18/2017   TRIG 145 12/18/2017   CHOLHDL 3.9 12/18/2017      Also, the patient has history of T2_NIDDM (A1c 7.3%/2012) and has had no symptoms of reactive hypoglycemia, diabetic polys, paresthesias or visual blurring.  Last A1c was not at goal: Lab Results  Component Value Date   HGBA1C 9.3 (H) 12/18/2017      Patient has been on parenteral Replacement Testosterone Tx since 07/2016 for low T "206" and reports improved mood, libido  & stamina. Further, the patient also has history of Vitamin D Deficiency ("44" on Tx in 2013) and supplements vitamin D without any suspected side-effects. Last vitamin D was at goal:  Lab Results  Component Value Date   VD25OH 62 09/11/2017   Current Outpatient Medications on File Prior to Visit  Medication Sig  . APPLE CIDER VINEGAR PO Take by mouth.  Marland Kitchen aspirin 81 MG tablet Take 81 mg by mouth 2 (two) times daily.   . B-D 3CC LUER-LOK SYR 23GX1" 23G X 1" 3 ML MISC See admin instructions.  . Cholecalciferol (VITAMIN D PO) Take 10,000 Units daily by mouth.   . Cinnamon 500 MG capsule Take 500 mg daily by mouth.  . gabapentin (NEURONTIN) 100 MG capsule TAKE 1 CAPSULE BY MOUTH THREE TIMES A DAY  . glucose blood test strip Use as instructed  . hyoscyamine (LEVSIN SL) 0.125 MG SL tablet DISSOLVE ONE TABLET UNDER THE TONGUE 4 TIMES DAILY UP TO EVERY 4 HOURS AS  NEEDED FOR NAUSEA, BLOATING, CRAMPING, OR DIARRHEA  . metFORMIN (GLUCOPHAGE-XR) 500 MG 24 hr tablet TAKE ONE TABLET BY MOUTH THREE TIMES DAILY WITH FOOD FOR DIABETES (Patient taking differently: TAKE ONE TABLET BY MOUTH FOUR TIMES DAILY WITH FOOD FOR DIABETES)  . RED YEAST RICE EXTRACT PO Take 1 capsule by mouth.  . simvastatin (ZOCOR) 80 MG tablet TAKE 1 TABLET (80 MG TOTAL) BY MOUTH DAILY.  . SYRINGE-NEEDLE, DISP, 3 ML 21G X 1" 3 ML MISC As directed  . testosterone cypionate (DEPOTESTOSTERONE CYPIONATE) 200 MG/ML injection INJECT 2ML INTRAMUSCULARLY EVERY 2 WEEKS AS DIRECTED   No current facility-administered medications on file prior to visit.    Allergies  Allergen Reactions  . Lipitor [Atorvastatin]     fatigue   PMHx:   Past Medical History:  Diagnosis Date  . Abnormal EKG   . DM (diabetes mellitus) ([place])   . Hyperlipidemia   . Hypogonadism male   . IBS (irritable bowel syndrome)   . Obesity    BMI 31  . Vitamin D deficiency    Immunization History  Administered Date(s) Administered  . Influenza-Unspecified 09/02/2015, 09/24/2017  . PPD Test 05/24/2014, 07/18/2015, 08/08/2016, 09/11/2017  . Pneumococcal-Unspecified 05/17/2013  . Tdap 05/17/2013   Past Surgical History:  Procedure Laterality Date  . APPENDECTOMY    .  JOINT REPLACEMENT     pt. denies   . SHOULDER SURGERY Right    FHx:    Reviewed / unchanged  SHx:    Reviewed / unchanged  Systems Review:  Constitutional: Denies fever, chills, wt changes, headaches, insomnia, fatigue, night sweats, change in appetite. Eyes: Denies redness, blurred vision, diplopia, discharge, itchy, watery eyes.  ENT: Denies discharge, congestion, post nasal drip, epistaxis, sore throat, earache, hearing loss, dental pain, tinnitus, vertigo, sinus pain, snoring.  CV: Denies chest pain, palpitations, irregular heartbeat, syncope, dyspnea, diaphoresis, orthopnea, PND, claudication or edema. Respiratory: denies cough, dyspnea, DOE,  pleurisy, hoarseness, laryngitis, wheezing.  Gastrointestinal: Denies dysphagia, odynophagia, heartburn, reflux, water brash, abdominal pain or cramps, nausea, vomiting, bloating, diarrhea, constipation, hematemesis, melena, hematochezia  or hemorrhoids. Genitourinary: Denies dysuria, frequency, urgency, nocturia, hesitancy, discharge, hematuria or flank pain. Musculoskeletal: Denies arthralgias, myalgias, stiffness, jt. swelling, pain, limping or strain/sprain.  Skin: Denies pruritus, rash, hives, warts, acne, eczema or change in skin lesion(s). Neuro: No weakness, tremor, incoordination, spasms, paresthesia or pain. Psychiatric: Denies confusion, memory loss or sensory loss. Endo: Denies change in weight, skin or hair change.  Heme/Lymph: No excessive bleeding, bruising or enlarged lymph nodes.  Physical Exam  BP 110/70   Pulse 60   Temp (!) 97.5 F (36.4 C)   Ht 5\' 10"  (1.778 m)   Wt 202 lb (91.6 kg)   SpO2 98%   BMI 28.98 kg/m   Appears  well nourished, well groomed  and in no distress.  Eyes: PERRLA, EOMs, conjunctiva no swelling or erythema. Sinuses: No frontal/maxillary tenderness ENT/Mouth: EAC's clear, TM's nl w/o erythema, bulging. Nares clear w/o erythema, swelling, exudates. Oropharynx clear without erythema or exudates. Oral hygiene is good. Tongue normal, non obstructing. Hearing intact.  Neck: Supple. Thyroid not palpable. Car 2+/2+ without bruits, nodes or JVD. Chest: Respirations nl with BS clear & equal w/o rales, rhonchi, wheezing or stridor.  Cor: Heart sounds normal w/ regular rate and rhythm without sig. murmurs, gallops, clicks or rubs. Peripheral pulses normal and equal  without edema.  Abdomen: Soft & bowel sounds normal. Non-tender w/o guarding, rebound, hernias, masses or organomegaly.  Lymphatics: Unremarkable.  Musculoskeletal: Full ROM all peripheral extremities, joint stability, 5/5 strength and normal gait.  Skin: Warm, dry without exposed rashes,  lesions or ecchymosis apparent.  Neuro: Cranial nerves intact, reflexes equal bilaterally. Sensory-motor testing grossly intact. Tendon reflexes grossly intact.  Pysch: Alert & oriented x 3.  Insight and judgement nl & appropriate. No ideations.  Assessment and Plan:  1. Essential hypertension  - Continue medication, monitor blood pressure at home.  - Continue DASH diet.  Reminder to go to the ER if any CP,  SOB, nausea, dizziness, severe HA, changes vision/speech.  - CBC with Differential/Platelet - COMPLETE METABOLIC PANEL WITH GFR - Magnesium - TSH  2. Hyperlipidemia, mixed  - Continue diet/meds, exercise,& lifestyle modifications.  - Continue monitor periodic cholesterol/liver & renal functions   - Lipid panel - TSH  3. Diabetes mellitus without complication (HCC)  - Hemoglobin A1c - Insulin, random  4. Vitamin D deficiency  - Continue diet, exercise, lifestyle modifications.  - Monitor appropriate labs. - Continue supplementation.  - VITAMIN D 25 Hydroxyl  5. Testosterone Deficiency  - Testosterone  6. Medication management  - CBC with Differential/Platelet - COMPLETE METABOLIC PANEL WITH GFR - Magnesium - Lipid panel - TSH - Hemoglobin A1c - Insulin, random - VITAMIN D 25 Hydroxyl - Testosterone  Discussed  regular exercise, BP monitoring, weight control to achieve/maintain BMI less than 25 and discussed med and SE's. Recommended labs to assess and monitor clinical status with further disposition pending results of labs. Over 30 minutes of exam, counseling, chart review was performed.

## 2018-03-20 LAB — HEMOGLOBIN A1C
HEMOGLOBIN A1C: 7.4 %{Hb} — AB (ref <5.7)
MEAN PLASMA GLUCOSE: 166 (calc)
eAG (mmol/L): 9.2 (calc)

## 2018-03-20 LAB — TESTOSTERONE: Testosterone: 478 ng/dL (ref 250–827)

## 2018-03-20 LAB — VITAMIN D 25 HYDROXY (VIT D DEFICIENCY, FRACTURES): VIT D 25 HYDROXY: 95 ng/mL (ref 30–100)

## 2018-03-20 LAB — INSULIN, RANDOM: Insulin: 11.8 u[IU]/mL (ref 2.0–19.6)

## 2018-03-21 ENCOUNTER — Encounter: Payer: Self-pay | Admitting: Internal Medicine

## 2018-03-25 ENCOUNTER — Other Ambulatory Visit: Payer: Self-pay | Admitting: Internal Medicine

## 2018-04-07 ENCOUNTER — Ambulatory Visit (INDEPENDENT_AMBULATORY_CARE_PROVIDER_SITE_OTHER): Payer: BLUE CROSS/BLUE SHIELD | Admitting: Internal Medicine

## 2018-04-07 ENCOUNTER — Encounter: Payer: Self-pay | Admitting: Internal Medicine

## 2018-04-07 VITALS — BP 112/70 | HR 75 | Temp 97.7°F | Resp 14 | Ht 70.0 in | Wt 199.2 lb

## 2018-04-07 DIAGNOSIS — L570 Actinic keratosis: Secondary | ICD-10-CM | POA: Diagnosis not present

## 2018-04-07 DIAGNOSIS — B079 Viral wart, unspecified: Secondary | ICD-10-CM | POA: Diagnosis not present

## 2018-04-08 NOTE — Progress Notes (Signed)
  Subjective:    Patient ID: Zachary Reid, male    DOB: 1956-02-07, 62 y.o.   MRN: [id number]  HPI  Patient presents with 2 areas of skin concerns on his face  Medication Sig  . APPLE CIDER VINEGAR PO Take by mouth.  Marland Kitchen aspirin 81 MG tablet Take 81 mg by mouth 2 (two) times daily.   . B-D 3CC LUER-LOK SYR 23GX1" 23G X 1" 3 ML MISC See admin instructions.  . Cholecalciferol (VITAMIN D PO) Take 10,000 Units daily by mouth.   . Cinnamon 500 MG capsule Take 500 mg daily by mouth.  . gabapentin (NEURONTIN) 100 MG capsule TAKE 1 CAPSULE BY MOUTH THREE TIMES A DAY  . glucose blood test strip Use as instructed  . hyoscyamine (LEVSIN SL) 0.125 MG SL tablet DISSOLVE ONE TABLET UNDER THE TONGUE 4 TIMES DAILY UP TO EVERY 4 HOURS AS NEEDED FOR NAUSEA, BLOATING, CRAMPING, OR DIARRHEA  . metFORMIN (GLUCOPHAGE-XR) 500 MG 24 hr tablet TAKE ONE TABLET BY MOUTH THREE TIMES DAILY WITH FOOD FOR DIABETES  . RED YEAST RICE EXTRACT PO Take 1 capsule by mouth.  . simvastatin (ZOCOR) 80 MG tablet TAKE 1 TABLET (80 MG TOTAL) BY MOUTH DAILY.  . SYRINGE-NEEDLE, DISP, 3 ML 21G X 1" 3 ML MISC As directed  . testosterone cypionate (DEPOTESTOSTERONE CYPIONATE) 200 MG/ML injection INJECT 2ML INTRAMUSCULARLY EVERY 2 WEEKS AS DIRECTED   Allergies  Allergen Reactions  . Lipitor [Atorvastatin]     fatigue   Past Medical History:  Diagnosis Date  . Abnormal EKG   . DM (diabetes mellitus) ([place])   . Hyperlipidemia   . Hypogonadism male   . IBS (irritable bowel syndrome)   . Obesity    BMI 31  . Vitamin D deficiency    Review of Systems  10 point systems review negative except as above.    Objective:   Physical Exam . BP 112/70   Pulse 75   Temp 97.7 F (36.5 C)   Resp 14   Ht 5\' 10"  (1.778 m)   Wt 199 lb 3.2 oz (90.4 kg)   SpO2 96%   BMI 28.58 kg/m   HEENT - WNL. Neck - supple.  Chest - Clear equal BS. Cor - Nl HS. RRR w/o sig m. MS- FROM w/o deformities.  Gait Nl. Neuro -  Nl w/o focal  abnormalities.  SKIN - There is 5 mm broad exophtric filamentous lesion at the hairline of the Rt widow's peak which appears to be a verrucca.  (Procedure : CPT 17000)   Also, there is a pink crusted sl ulcerated 12 mm x 15 mm area of the Rt temple which appears to be an actinic or solar keratosis.    (Procedure : CPT 23762)     After informed consent, both areas were treated with liquid Nitrogen by a triple freeze thaw x 4 and patient was informed of post treatment expectations and advised of wound care.     Assessment & Plan:   1. Actinic keratosis of right temple   2. Wart of face

## 2018-05-11 ENCOUNTER — Encounter: Payer: Self-pay | Admitting: Internal Medicine

## 2018-05-24 ENCOUNTER — Other Ambulatory Visit: Payer: Self-pay | Admitting: Internal Medicine

## 2018-05-30 ENCOUNTER — Other Ambulatory Visit: Payer: Self-pay | Admitting: Physician Assistant

## 2018-06-02 ENCOUNTER — Other Ambulatory Visit: Payer: Self-pay | Admitting: Physician Assistant

## 2018-07-08 NOTE — Progress Notes (Signed)
FOLLOW UP  Assessment and Plan:   Hypertension Well controlled with current medications  Monitor blood pressure at home; patient to call if consistently greater than 130/80 Continue DASH diet.   Reminder to go to the ER if any CP, SOB, nausea, dizziness, severe HA, changes vision/speech, left arm numbness and tingling and jaw pain.  Cholesterol Currently at LDL goal; continue statin Continue low cholesterol diet and exercise.  Check lipid panel.   Diabetes with diabetic chronic kidney disease Continue medication: continue metformin; if elevated, wants to work on diet, did discuss SGLT 2 and GLP options today.  Continue diet and exercise.  Perform daily foot/skin check, notify office of any concerning changes.  Check A1C  Overweight Long discussion about weight loss, diet, and exercise Recommended diet heavy in fruits and veggies and low in animal meats, cheeses, and dairy products, appropriate calorie intake Discussed ideal weight for height Patient will work on continue with portion control and start more intentional exercise Will follow up in 3 months  Vitamin D Def At goal at last visit; continue supplementation to maintain goal of 70-100 Defer Vit D level  Continue diet and meds as discussed. Further disposition pending results of labs. Discussed med's effects and SE's.   Over 30 minutes of exam, counseling, chart review, and critical decision making was performed.   Future Appointments  Date Time Provider [place]  07/10/2018 10:15 AM Liane Comber, NP [facility] None  10/13/2018  2:00 PM [name], MD [facility] None    ----------------------------------------------------------------------------------------------------------------------  HPI 62 y.o. male  presents for 3 month follow up on hypertension, cholesterol, T2 diabetes, obesity and vitamin D deficiency.   BMI is Body mass index is 28.27 kg/m., he has been watching portions, but no other  particular interventions, no current intentional exercise. He does walk his dogs a few times a week for 30 min at the time.  Wt Readings from Last 3 Encounters:  07/10/18 197 lb (89.4 kg)  04/07/18 199 lb 3.2 oz (90.4 kg)  03/19/18 202 lb (91.6 kg)   His blood pressure has been controlled at home, today their BP is BP: 122/76  He does not workout. He denies chest pain, shortness of breath, dizziness.   He is on cholesterol medication (simvastatin 80 mg daily) and denies myalgias. His cholesterol is not at goal. The cholesterol last visit was:   Lab Results  Component Value Date   CHOL 106 03/19/2018   HDL 40 (L) 03/19/2018   LDLCALC 52 03/19/2018   TRIG 67 03/19/2018   CHOLHDL 2.7 03/19/2018    He has not been working on diet and exercise for T2 diabetes, and denies increased appetite, nausea, paresthesia of the feet, polydipsia, polyuria, visual disturbances, vomiting and weight loss. He has not been able to tolerate 2000 mg of metformin weekly. He reports he does check fasting glucose, and random sugars throughout the day and has been running high recently. He admits he has been not adhering to diet and can do much better. 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 He states that the testosterone helps with his energy, libido, muscle mass. Lab Results  Component Value Date   TESTOSTERONE  478 03/19/2018     Current Medications:  Current Outpatient Medications on File Prior to Visit  Medication Sig  . APPLE CIDER VINEGAR PO Take by mouth.  Marland Kitchen aspirin 81 MG tablet Take 81 mg by mouth 2 (two) times daily.   . B-D 3CC LUER-LOK SYR 23GX1" 23G X 1" 3 ML MISC See admin instructions.  . Cholecalciferol (VITAMIN D PO) Take 10,000  Units daily by mouth.   . Cinnamon 500 MG capsule Take 500 mg daily by mouth.  . gabapentin (NEURONTIN) 100 MG capsule TAKE 1 CAPSULE BY MOUTH THREE TIMES A DAY  . glucose blood test strip Use as instructed  . hyoscyamine (LEVSIN SL) 0.125 MG SL tablet DISSOLVE ONE TABLET UNDER THE TONGUE 4 TIMES DAILY UP TO EVERY 4 HOURS AS NEEDED FOR NAUSEA, BLOATING, CRAMPING, OR DIARRHEA  . metFORMIN (GLUCOPHAGE-XR) 500 MG 24 hr tablet Take 2 tablets 2 x /day for Diabetes  . metFORMIN (GLUCOPHAGE-XR) 500 MG 24 hr tablet TAKE ONE TABLET BY MOUTH THREE TIMES DAILY WITH FOOD FOR DIABETES  . RED YEAST RICE EXTRACT PO Take 1 capsule by mouth.  . simvastatin (ZOCOR) 80 MG tablet TAKE 1 TABLET (80 MG TOTAL) BY MOUTH DAILY.  . SYRINGE-NEEDLE, DISP, 3 ML 21G X 1" 3 ML MISC As directed  . testosterone cypionate (DEPOTESTOSTERONE CYPIONATE) 200 MG/ML injection INJECT 2ML INTRAMUSCULARLY EVERY 2 WEEKS AS DIRECTED   No current facility-administered medications on file prior to visit.      Allergies:  Allergies  Allergen Reactions  . Lipitor [Atorvastatin]     fatigue     Medical History:  Past Medical History:  Diagnosis Date  . Abnormal EKG   . DM (diabetes mellitus) ([place])   . Hyperlipidemia   . Hypogonadism male   . IBS (irritable bowel syndrome)   . Obesity    BMI 31  . Vitamin D deficiency    Family history- Reviewed and unchanged Social history- Reviewed and unchanged   Review of Systems:  Review of Systems  Constitutional: Negative for malaise/fatigue and weight loss.  HENT: Negative for hearing loss and tinnitus.   Eyes: Negative for blurred vision and double vision.  Respiratory: Negative for cough, shortness of breath and wheezing.   Cardiovascular: Negative for chest pain, palpitations, orthopnea, claudication and leg swelling.  Gastrointestinal: Negative for abdominal pain, blood in stool, constipation, diarrhea, heartburn, melena, nausea and vomiting.  Genitourinary: Negative.    Musculoskeletal: Negative for joint pain and myalgias.  Skin: Negative for rash.  Neurological: Negative for dizziness, tingling, sensory change, weakness and headaches.  Endo/Heme/Allergies: Negative for polydipsia.  Psychiatric/Behavioral: Negative.   All other systems reviewed and are negative.   Physical Exam: BP 122/76   Pulse (!) 58   Temp (!) 97.2 F (36.2 C)   Ht 5\' 10"  (1.778 m)   Wt 197 lb (89.4 kg)   SpO2 93%   BMI 28.27 kg/m  Wt Readings from Last 3 Encounters:  07/10/18 197 lb (89.4 kg)  04/07/18 199 lb 3.2 oz (90.4 kg)  03/19/18 202 lb (91.6 kg)   General Appearance: Well nourished, in no apparent distress. Eyes: PERRLA, EOMs, conjunctiva no swelling or erythema Sinuses: No Frontal/maxillary tenderness ENT/Mouth: Ext aud canals clear, TMs without erythema, bulging. No erythema, swelling, or exudate on post pharynx.  Tonsils not swollen or erythematous. Hearing normal.  Neck: Supple, thyroid normal.  Respiratory: Respiratory effort normal, BS equal bilaterally without rales, rhonchi, wheezing or stridor.  Cardio: RRR with no MRGs. Brisk  peripheral pulses without edema.  Abdomen: Soft, + BS.  Non tender, no guarding, rebound, hernias, masses. Lymphatics: Non tender without lymphadenopathy.  Musculoskeletal: Full ROM, 5/5 strength, Normal gait Skin: Warm, dry without rashes, lesions, ecchymosis.  Neuro: Cranial nerves intact. No cerebellar symptoms.  Psych: Awake and oriented X 3, normal affect, Insight and Judgment appropriate.    Izora Ribas, NP 10:10 AM [facility]

## 2018-07-10 ENCOUNTER — Encounter: Payer: Self-pay | Admitting: Adult Health

## 2018-07-10 ENCOUNTER — Ambulatory Visit (INDEPENDENT_AMBULATORY_CARE_PROVIDER_SITE_OTHER): Payer: BLUE CROSS/BLUE SHIELD | Admitting: Adult Health

## 2018-07-10 VITALS — BP 122/76 | HR 58 | Temp 97.2°F | Ht 70.0 in | Wt 197.0 lb

## 2018-07-10 DIAGNOSIS — I10 Essential (primary) hypertension: Secondary | ICD-10-CM | POA: Diagnosis not present

## 2018-07-10 DIAGNOSIS — E1122 Type 2 diabetes mellitus with diabetic chronic kidney disease: Secondary | ICD-10-CM | POA: Diagnosis not present

## 2018-07-10 DIAGNOSIS — Z79899 Other long term (current) drug therapy: Secondary | ICD-10-CM | POA: Diagnosis not present

## 2018-07-10 DIAGNOSIS — E291 Testicular hypofunction: Secondary | ICD-10-CM | POA: Diagnosis not present

## 2018-07-10 DIAGNOSIS — E785 Hyperlipidemia, unspecified: Secondary | ICD-10-CM

## 2018-07-10 DIAGNOSIS — E559 Vitamin D deficiency, unspecified: Secondary | ICD-10-CM

## 2018-07-10 DIAGNOSIS — E1169 Type 2 diabetes mellitus with other specified complication: Secondary | ICD-10-CM

## 2018-07-10 DIAGNOSIS — E663 Overweight: Secondary | ICD-10-CM

## 2018-07-10 DIAGNOSIS — N182 Chronic kidney disease, stage 2 (mild): Secondary | ICD-10-CM

## 2018-07-10 NOTE — Patient Instructions (Signed)
Aim for 7+ servings of fruits and vegetables daily  65-80+ fluid ounces of water or unsweet tea for healthy kidneys  Limit to max 1 drink of alcohol per day; avoid smoking/tobacco  Limit animal fats in diet for cholesterol and heart health - choose grass fed whenever available  Avoid highly processed foods, and foods high in saturated/trans fats  Aim for low stress - take time to unwind and care for your mental health  Aim for 150 min of moderate intensity exercise weekly for heart health, and weights twice weekly for bone health  Aim for 7-9 hours of sleep daily     Diabetes Mellitus and Nutrition When you have diabetes (diabetes mellitus), it is very important to have healthy eating habits because your blood sugar (glucose) levels are greatly affected by what you eat and drink. Eating healthy foods in the appropriate amounts, at about the same times every day, can help you:  Control your blood glucose.  Lower your risk of heart disease.  Improve your blood pressure.  Reach or maintain a healthy weight.  Every person with diabetes is different, and each person has different needs for a meal plan. Your health care provider may recommend that you work with a diet and nutrition specialist (dietitian) to make a meal plan that is best for you. Your meal plan may vary depending on factors such as:  The calories you need.  The medicines you take.  Your weight.  Your blood glucose, blood pressure, and cholesterol levels.  Your activity level.  Other health conditions you have, such as heart or kidney disease.  How do carbohydrates affect me? Carbohydrates affect your blood glucose level more than any other type of food. Eating carbohydrates naturally increases the amount of glucose in your blood. Carbohydrate counting is a method for keeping track of how many carbohydrates you eat. Counting carbohydrates is important to keep your blood glucose at a healthy level, especially if  you use insulin or take certain oral diabetes medicines. It is important to know how many carbohydrates you can safely have in each meal. This is different for every person. Your dietitian can help you calculate how many carbohydrates you should have at each meal and for snack. Foods that contain carbohydrates include:  Bread, cereal, rice, pasta, and crackers.  Potatoes and corn.  Peas, beans, and lentils.  Milk and yogurt.  Fruit and juice.  Desserts, such as cakes, cookies, ice cream, and candy.  How does alcohol affect me? Alcohol can cause a sudden decrease in blood glucose (hypoglycemia), especially if you use insulin or take certain oral diabetes medicines. Hypoglycemia can be a life-threatening condition. Symptoms of hypoglycemia (sleepiness, dizziness, and confusion) are similar to symptoms of having too much alcohol. If your health care provider says that alcohol is safe for you, follow these guidelines:  Limit alcohol intake to no more than 1 drink per day for nonpregnant women and 2 drinks per day for men. One drink equals 12 oz of beer, 5 oz of wine, or 1 oz of hard liquor.  Do not drink on an empty stomach.  Keep yourself hydrated with water, diet soda, or unsweetened iced tea.  Keep in mind that regular soda, juice, and other mixers may contain a lot of sugar and must be counted as carbohydrates.  What are tips for following this plan? Reading food labels  Start by checking the serving size on the label. The amount of calories, carbohydrates, fats, and other nutrients listed on  the label are based on one serving of the food. Many foods contain more than one serving per package.  Check the total grams (g) of carbohydrates in one serving. You can calculate the number of servings of carbohydrates in one serving by dividing the total carbohydrates by 15. For example, if a food has 30 g of total carbohydrates, it would be equal to 2 servings of carbohydrates.  Check the  number of grams (g) of saturated and trans fats in one serving. Choose foods that have low or no amount of these fats.  Check the number of milligrams (mg) of sodium in one serving. Most people should limit total sodium intake to less than 2,300 mg per day.  Always check the nutrition information of foods labeled as "low-fat" or "nonfat". These foods may be higher in added sugar or refined carbohydrates and should be avoided.  Talk to your dietitian to identify your daily goals for nutrients listed on the label. Shopping  Avoid buying canned, premade, or processed foods. These foods tend to be high in fat, sodium, and added sugar.  Shop around the outside edge of the grocery store. This includes fresh fruits and vegetables, bulk grains, fresh meats, and fresh dairy. Cooking  Use low-heat cooking methods, such as baking, instead of high-heat cooking methods like deep frying.  Cook using healthy oils, such as olive, canola, or sunflower oil.  Avoid cooking with butter, cream, or high-fat meats. Meal planning  Eat meals and snacks regularly, preferably at the same times every day. Avoid going long periods of time without eating.  Eat foods high in fiber, such as fresh fruits, vegetables, beans, and whole grains. Talk to your dietitian about how many servings of carbohydrates you can eat at each meal.  Eat 4-6 ounces of lean protein each day, such as lean meat, chicken, fish, eggs, or tofu. 1 ounce is equal to 1 ounce of meat, chicken, or fish, 1 egg, or 1/4 cup of tofu.  Eat some foods each day that contain healthy fats, such as avocado, nuts, seeds, and fish. Lifestyle   Check your blood glucose regularly.  Exercise at least 30 minutes 5 or more days each week, or as told by your health care provider.  Take medicines as told by your health care provider.  Do not use any products that contain nicotine or tobacco, such as cigarettes and e-cigarettes. If you need help quitting, ask  your health care provider.  Work with a Social worker or diabetes educator to identify strategies to manage stress and any emotional and social challenges. What are some questions to ask my health care provider?  Do I need to meet with a diabetes educator?  Do I need to meet with a dietitian?  What number can I call if I have questions?  When are the best times to check my blood glucose? Where to find more information:  American Diabetes Association: diabetes.org/food-and-fitness/food  Academy of Nutrition and Dietetics: PokerClues.dk  [employer] of Diabetes and Digestive and Kidney Diseases (NIH): ContactWire.be Summary  A healthy meal plan will help you control your blood glucose and maintain a healthy lifestyle.  Working with a diet and nutrition specialist (dietitian) can help you make a meal plan that is best for you.  Keep in mind that carbohydrates and alcohol have immediate effects on your blood glucose levels. It is important to count carbohydrates and to use alcohol carefully. This information is not intended to replace advice given to you by your health  care provider. Make sure you discuss any questions you have with your health care provider. Document Released: 08/01/2005 Document Revised: 12/09/2016 Document Reviewed: 12/09/2016 Elsevier Interactive Patient Education  [employer].

## 2018-07-11 LAB — LIPID PANEL
CHOL/HDL RATIO: 3.4 (calc) (ref <5.0)
Cholesterol: 153 mg/dL (ref <200)
HDL: 45 mg/dL (ref >40)
LDL Cholesterol (Calc): 90 mg/dL (calc)
NON-HDL CHOLESTEROL (CALC): 108 mg/dL (ref <130)
TRIGLYCERIDES: 88 mg/dL (ref <150)

## 2018-07-11 LAB — HEMOGLOBIN A1C
Hgb A1c MFr Bld: 8.7 % of total Hgb — ABNORMAL HIGH (ref <5.7)
Mean Plasma Glucose: 203 (calc)
eAG (mmol/L): 11.2 (calc)

## 2018-07-11 LAB — CBC WITH DIFFERENTIAL/PLATELET
Basophils Absolute: 19 cells/uL (ref 0–200)
Basophils Relative: 0.3 %
EOS PCT: 0.8 %
Eosinophils Absolute: 51 cells/uL (ref 15–500)
HEMATOCRIT: 45.6 % (ref 38.5–50.0)
Hemoglobin: 15.7 g/dL (ref 13.2–17.1)
Lymphs Abs: 1498 cells/uL (ref 850–3900)
MCH: 31.7 pg (ref 27.0–33.0)
MCHC: 34.4 g/dL (ref 32.0–36.0)
MCV: 92.1 fL (ref 80.0–100.0)
MONOS PCT: 6.8 %
MPV: 9.8 fL (ref 7.5–12.5)
NEUTROS ABS: 4397 {cells}/uL (ref 1500–7800)
Neutrophils Relative %: 68.7 %
Platelets: 196 10*3/uL (ref 140–400)
RBC: 4.95 10*6/uL (ref 4.20–5.80)
RDW: 11.8 % (ref 11.0–15.0)
Total Lymphocyte: 23.4 %
WBC mixed population: 435 cells/uL (ref 200–950)
WBC: 6.4 10*3/uL (ref 3.8–10.8)

## 2018-07-11 LAB — COMPLETE METABOLIC PANEL WITH GFR
AG Ratio: 1.8 (calc) (ref 1.0–2.5)
ALT: 19 U/L (ref 9–46)
AST: 16 U/L (ref 10–35)
Albumin: 4.8 g/dL (ref 3.6–5.1)
Alkaline phosphatase (APISO): 61 U/L (ref 40–115)
BUN: 16 mg/dL (ref 7–25)
CALCIUM: 9.9 mg/dL (ref 8.6–10.3)
CO2: 28 mmol/L (ref 20–32)
Chloride: 102 mmol/L (ref 98–110)
Creat: 0.89 mg/dL (ref 0.70–1.25)
GFR, EST AFRICAN AMERICAN: 107 mL/min/{1.73_m2} (ref >=60)
GFR, EST NON AFRICAN AMERICAN: 92 mL/min/{1.73_m2} (ref >=60)
GLUCOSE: 240 mg/dL — AB (ref 65–99)
Globulin: 2.6 g/dL (calc) (ref 1.9–3.7)
Potassium: 4.8 mmol/L (ref 3.5–5.3)
Sodium: 140 mmol/L (ref 135–146)
TOTAL PROTEIN: 7.4 g/dL (ref 6.1–8.1)
Total Bilirubin: 0.9 mg/dL (ref 0.2–1.2)

## 2018-07-11 LAB — MAGNESIUM: Magnesium: 2.2 mg/dL (ref 1.5–2.5)

## 2018-07-11 LAB — TSH: TSH: 1.13 mIU/L (ref 0.40–4.50)

## 2018-10-13 ENCOUNTER — Ambulatory Visit (INDEPENDENT_AMBULATORY_CARE_PROVIDER_SITE_OTHER): Payer: BLUE CROSS/BLUE SHIELD | Admitting: Internal Medicine

## 2018-10-13 ENCOUNTER — Encounter: Payer: Self-pay | Admitting: Internal Medicine

## 2018-10-13 VITALS — BP 122/78 | HR 77 | Temp 97.5°F | Ht 70.0 in | Wt 193.8 lb

## 2018-10-13 DIAGNOSIS — I10 Essential (primary) hypertension: Secondary | ICD-10-CM

## 2018-10-13 DIAGNOSIS — N401 Enlarged prostate with lower urinary tract symptoms: Secondary | ICD-10-CM

## 2018-10-13 DIAGNOSIS — Z13220 Encounter for screening for lipoid disorders: Secondary | ICD-10-CM

## 2018-10-13 DIAGNOSIS — Z23 Encounter for immunization: Secondary | ICD-10-CM | POA: Diagnosis not present

## 2018-10-13 DIAGNOSIS — Z136 Encounter for screening for cardiovascular disorders: Secondary | ICD-10-CM

## 2018-10-13 DIAGNOSIS — Z79899 Other long term (current) drug therapy: Secondary | ICD-10-CM | POA: Diagnosis not present

## 2018-10-13 DIAGNOSIS — Z8249 Family history of ischemic heart disease and other diseases of the circulatory system: Secondary | ICD-10-CM

## 2018-10-13 DIAGNOSIS — Z1389 Encounter for screening for other disorder: Secondary | ICD-10-CM

## 2018-10-13 DIAGNOSIS — R350 Frequency of micturition: Secondary | ICD-10-CM

## 2018-10-13 DIAGNOSIS — Z131 Encounter for screening for diabetes mellitus: Secondary | ICD-10-CM

## 2018-10-13 DIAGNOSIS — E291 Testicular hypofunction: Secondary | ICD-10-CM

## 2018-10-13 DIAGNOSIS — Z111 Encounter for screening for respiratory tuberculosis: Secondary | ICD-10-CM

## 2018-10-13 DIAGNOSIS — Z130 Encounter for screening for diseases of the blood and blood-forming organs and certain disorders involving the immune mechanism: Secondary | ICD-10-CM | POA: Diagnosis not present

## 2018-10-13 DIAGNOSIS — N182 Chronic kidney disease, stage 2 (mild): Secondary | ICD-10-CM

## 2018-10-13 DIAGNOSIS — Z1329 Encounter for screening for other suspected endocrine disorder: Secondary | ICD-10-CM | POA: Diagnosis not present

## 2018-10-13 DIAGNOSIS — Z0001 Encounter for general adult medical examination with abnormal findings: Secondary | ICD-10-CM

## 2018-10-13 DIAGNOSIS — Z1211 Encounter for screening for malignant neoplasm of colon: Secondary | ICD-10-CM

## 2018-10-13 DIAGNOSIS — Z1212 Encounter for screening for malignant neoplasm of rectum: Secondary | ICD-10-CM

## 2018-10-13 DIAGNOSIS — Z0000 Encounter for general adult medical examination without abnormal findings: Secondary | ICD-10-CM | POA: Diagnosis not present

## 2018-10-13 DIAGNOSIS — E559 Vitamin D deficiency, unspecified: Secondary | ICD-10-CM

## 2018-10-13 DIAGNOSIS — E782 Mixed hyperlipidemia: Secondary | ICD-10-CM

## 2018-10-13 DIAGNOSIS — Z125 Encounter for screening for malignant neoplasm of prostate: Secondary | ICD-10-CM | POA: Diagnosis not present

## 2018-10-13 DIAGNOSIS — R0989 Other specified symptoms and signs involving the circulatory and respiratory systems: Secondary | ICD-10-CM | POA: Insufficient documentation

## 2018-10-13 DIAGNOSIS — E1122 Type 2 diabetes mellitus with diabetic chronic kidney disease: Secondary | ICD-10-CM

## 2018-10-13 DIAGNOSIS — R5383 Other fatigue: Secondary | ICD-10-CM

## 2018-10-13 NOTE — Patient Instructions (Signed)

## 2018-10-13 NOTE — Progress Notes (Addendum)
[facility]   [name], M.D.     Uvaldo Bristle. Silverio Lay, P.A.-C Liane Comber, [place]                [address] [place], [place] [address] Telephone [phone] Telefax [phone] Annual  Screening/Preventative Visit  & Comprehensive Evaluation & Examination     This very nice 62 y.o. MWM presents for a Screening /Preventative Visit & comprehensive evaluation and management of multiple medical co-morbidities.  Patient has been followed for HTN, HLD, T2_NIDDM  and Vitamin D Deficiency.     Patient has been followed expectantly since 2013 for labile HTN. Patient's BP has been controlled at home.  Today's BP is at goal - 122/78. Patient denies any cardiac symptoms as chest pain, palpitations, shortness of breath, dizziness or ankle swelling.     Patient's hyperlipidemia is controlled with diet and requiring medications. Patient denies myalgias or other medication SE's. Current  lipids are at goal: Lab Results  Component Value Date   CHOL 181 10/13/2018   HDL 43 10/13/2018   LDLCALC 111 (H) 10/13/2018   TRIG 154 (H) 10/13/2018   CHOLHDL 4.2 10/13/2018      Patient has hx/o Testosterone Deficiency (level "206" / Sept 2017) & is on replacement with improved libido, stamina & sense of well being.      Patient has hx/o T2_NIDDM (A1c 7.3% / 2012) and patient denies reactive hypoglycemic symptoms, visual blurring, diabetic polys or paresthesias. Current A1c is not at goal despite 20# weight loss from 213# to 193# over the last year: Lab Results  Component Value Date   HGBA1C 9.5 (H) 10/13/2018       Finally, patient has history of Vitamin D Deficiency ("44" / 2013)  and current vitamin D is at goal:  Lab Results  Component Value Date   VD25OH 66 10/13/2018   Current Outpatient Medications on File Prior to Visit  Medication Sig  . APPLE CIDER VINEGAR PO Take by mouth.  Marland Kitchen aspirin 81 MG  tablet Take 81 mg by mouth 2 (two) times daily.   . B-D 3CC LUER-LOK SYR 23GX1" 23G X 1" 3 ML MISC See admin instructions.  . Cholecalciferol (VITAMIN D PO) Take 10,000 Units daily by mouth.   . Cinnamon 500 MG capsule Take 500 mg daily by mouth.  . gabapentin (NEURONTIN) 100 MG capsule TAKE 1 CAPSULE BY MOUTH THREE TIMES A DAY  . glucose blood test strip Use as instructed  . hyoscyamine (LEVSIN SL) 0.125 MG SL tablet DISSOLVE ONE TABLET UNDER THE TONGUE 4 TIMES DAILY UP TO EVERY 4 HOURS AS NEEDED FOR NAUSEA, BLOATING, CRAMPING, OR DIARRHEA  . metFORMIN (GLUCOPHAGE-XR) 500 MG 24 hr tablet Take 2 tablets 2 x /day for Diabetes  . metFORMIN (GLUCOPHAGE-XR) 500 MG 24 hr tablet TAKE ONE TABLET BY MOUTH THREE TIMES DAILY WITH FOOD FOR DIABETES  . RED YEAST RICE EXTRACT PO Take 1 capsule by mouth.  . simvastatin (ZOCOR) 80 MG tablet TAKE 1 TABLET (80 MG TOTAL) BY MOUTH DAILY.  . SYRINGE-NEEDLE, DISP, 3 ML 21G X 1" 3 ML MISC As directed  . testosterone cypionate (DEPOTESTOSTERONE CYPIONATE) 200 MG/ML injection INJECT 2ML INTRAMUSCULARLY EVERY 2 WEEKS AS DIRECTED   No current facility-administered medications on file prior to visit.    Allergies  Allergen Reactions  . Lipitor [Atorvastatin]  fatigue   Past Medical History:  Diagnosis Date  . Abnormal EKG   . DM (diabetes mellitus) ([place])   . Hyperlipidemia   . Hypogonadism male   . IBS (irritable bowel syndrome)   . Obesity    BMI 31  . Vitamin D deficiency    Health Maintenance  Topic Date Due  . COLONOSCOPY  11/22/2015  . OPHTHALMOLOGY EXAM  12/25/2017  . HEMOGLOBIN A1C  04/13/2019  . FOOT EXAM  10/14/2019  . URINE MICROALBUMIN  10/14/2019  . TETANUS/TDAP  05/18/2023  . INFLUENZA VACCINE  Completed  . PNEUMOCOCCAL POLYSACCHARIDE VACCINE AGE 110-64 HIGH RISK  Completed  . Hepatitis C Screening  Completed  . HIV Screening  Completed   Immunization History  Administered Date(s) Administered  . Influenza-Unspecified 09/02/2015,  09/24/2017, 09/09/2018  . PPD Test 05/24/2014, 07/18/2015, 08/08/2016, 09/11/2017, 10/13/2018  . Pneumococcal Polysaccharide-23 10/13/2018  . Pneumococcal-Unspecified 05/17/2013  . Tdap 05/17/2013   Last Colon - 11/21/2005 - defers / agrees to [place]  Past Surgical History:  Procedure Laterality Date  . APPENDECTOMY    . JOINT REPLACEMENT     pt. denies   . SHOULDER SURGERY Right    Family History  Problem Relation Age of Onset  . Pancreatic cancer Mother   . Prostate cancer Father        Prostate  . Diabetes Father   . Hypertension Father   . Drug abuse Brother   . Kidney Stones Brother   . Ovarian cancer Paternal Grandmother   . Lung cancer Paternal Grandfather        Cigar smoker  . Colon cancer Neg Hx   . Colon polyps Neg Hx   . Esophageal cancer Neg Hx   . Stomach cancer Neg Hx   . Rectal cancer Neg Hx    Social History   Socioeconomic History  . Marital status: Yevette Edwards    Spouse name: Mickel Baas  . Number of children: 1 daughter  Occupational History  . Sales & marketing  Tobacco Use  . Smoking status: Never Smoker  . Smokeless tobacco: Never Used  Substance and Sexual Activity  . Alcohol use: No  . Drug use: No  . Sexual activity: Active    ROS Constitutional: Denies fever, chills, weight loss/gain, headaches, insomnia,  night sweats or change in appetite. Does c/o fatigue. Eyes: Denies redness, blurred vision, diplopia, discharge, itchy or watery eyes.  ENT: Denies discharge, congestion, post nasal drip, epistaxis, sore throat, earache, hearing loss, dental pain, Tinnitus, Vertigo, Sinus pain or snoring.  Cardio: Denies chest pain, palpitations, irregular heartbeat, syncope, dyspnea, diaphoresis, orthopnea, PND, claudication or edema Respiratory: denies cough, dyspnea, DOE, pleurisy, hoarseness, laryngitis or wheezing.  Gastrointestinal: Denies dysphagia, heartburn, reflux, water brash, pain, cramps, nausea, vomiting, bloating, diarrhea, constipation,  hematemesis, melena, hematochezia, jaundice or hemorrhoids Genitourinary: Denies dysuria, frequency, urgency, nocturia, hesitancy, discharge, hematuria or flank pain Musculoskeletal: Denies arthralgia, myalgia, stiffness, Jt. Swelling, pain, limp or strain/sprain. Denies Falls. Skin: Denies puritis, rash, hives, warts, acne, eczema or change in skin lesion Neuro: No weakness, tremor, incoordination, spasms, paresthesia or pain Psychiatric: Denies confusion, memory loss or sensory loss. Denies Depression. Endocrine: Denies change in weight, skin, hair change, nocturia, and paresthesia, diabetic polys, visual blurring or hyper / hypo glycemic episodes.  Heme/Lymph: No excessive bleeding, bruising or enlarged lymph nodes.  Physical Exam  BP 122/78   Pulse 77   Temp (!) 97.5 F (36.4 C)   Ht 5\' 10"  (1.778 m)   Wt 193 lb  12.8 oz (87.9 kg)   SpO2 98%   BMI 27.81 kg/m   General Appearance: Well nourished and well groomed and in no apparent distress.  Eyes: PERRLA, EOMs, conjunctiva no swelling or erythema, normal fundi and vessels. Sinuses: No frontal/maxillary tenderness ENT/Mouth: EACs patent / TMs  nl. Nares clear without erythema, swelling, mucoid exudates. Oral hygiene is good. No erythema, swelling, or exudate. Tongue normal, non-obstructing. Tonsils not swollen or erythematous. Hearing normal.  Neck: Supple, thyroid not palpable. No bruits, nodes or JVD. Respiratory: Respiratory effort normal.  BS equal and clear bilateral without rales, rhonci, wheezing or stridor. Cardio: Heart sounds are normal with regular rate and rhythm and no murmurs, rubs or gallops. Peripheral pulses are normal and equal bilaterally without edema. No aortic or femoral bruits. Chest: symmetric with normal excursions and percussion.  Abdomen: Soft, with Nl bowel sounds. Nontender, no guarding, rebound, hernias, masses, or organomegaly.  Lymphatics: Non tender without lymphadenopathy.  Genitourinary:  DRE -  deferred Musculoskeletal: Full ROM all peripheral extremities, joint stability, 5/5 strength, and normal gait. Skin: Warm and dry without rashes, lesions, cyanosis, clubbing or  ecchymosis.  Neuro: Cranial nerves intact, reflexes equal bilaterally. Normal muscle tone, no cerebellar symptoms. Sensation intact.  Pysch: Alert and oriented X 3 with normal affect, insight and judgment appropriate.   Assessment and Plan  1. Annual Preventative/Screening Exam   1. Encounter for general adult medical examination with abnormal findings   2. Labile hypertension  - EKG 12-Lead - Urinalysis, Routine w reflex microscopic - Microalbumin / creatinine urine ratio - CBC with Differential/Platelet - COMPLETE METABOLIC PANEL WITH GFR - Magnesium - TSH - [place], RETROPERITNL ABD,  LTD  3. Hyperlipidemia, mixed  - EKG 12-Lead - Lipid panel - TSH - [place], RETROPERITNL ABD,  LTD  4. Controlled type 2 diabetes mellitus with stage 2 chronic kidney disease, without long-term current use of insulin (HCC)  - EKG 12-Lead - Urinalysis, Routine w reflex microscopic - Microalbumin / creatinine urine ratio - HM DIABETES FOOT EXAM - LOW EXTREMITY NEUR EXAM DOCUM - Hemoglobin A1c - Insulin, random - [place], RETROPERITNL ABD,  LTD - Glutamic acid decarboxylase auto abs - to r/o "LADA"  5. Vitamin D deficiency  - VITAMIN D 25 Hydroxyl  6. Testosterone Deficiency  - Testosterone  7. Screening for colorectal cancer  - Cologuard  8. Prostate cancer screening  - PSA  9. Screening examination for pulmonary tuberculosis  - TB Skin Test  10. Screening for ischemic heart disease  - EKG 12-Lead - Lipid panel  11. FH: hypertension  - EKG 12-Lead - [place], RETROPERITNL ABD,  LTD  12. Screening for AAA (aortic abdominal aneurysm)  - [place], RETROPERITNL ABD,  LTD  13. Fatigue  - Iron,Total/Total Iron Binding Cap - Vitamin B12 - Testosterone - TSH  14. Medication management  - Urinalysis, Routine w  reflex microscopic - Microalbumin / creatinine urine ratio - Testosterone - CBC with Differential/Platelet - COMPLETE METABOLIC PANEL WITH GFR - Magnesium - Lipid panel - TSH - Hemoglobin A1c - Insulin, random - VITAMIN D 25 Hydroxyl  15. Need for pneumococcal vaccine  - Pneumococcal polysaccharide vaccine 23-valent greater than or equal to 2yo subcutaneous/IM     Patient was counseled in prudent diet, weight control to achieve/maintain BMI less than 25, BP monitoring, regular exercise and medications as discussed.  Discussed med effects and SE's. Routine screening labs and tests as requested with regular follow-up as recommended. Over 40 minutes of exam, counseling, chart review  and high complex critical decision making was performed

## 2018-10-14 LAB — CBC WITH DIFFERENTIAL/PLATELET
BASOS PCT: 0.4 %
Basophils Absolute: 27 cells/uL (ref 0–200)
EOS ABS: 68 {cells}/uL (ref 15–500)
Eosinophils Relative: 1 %
HCT: 43.3 % (ref 38.5–50.0)
HEMOGLOBIN: 15 g/dL (ref 13.2–17.1)
Lymphs Abs: 2142 cells/uL (ref 850–3900)
MCH: 32.2 pg (ref 27.0–33.0)
MCHC: 34.6 g/dL (ref 32.0–36.0)
MCV: 92.9 fL (ref 80.0–100.0)
MONOS PCT: 5.1 %
MPV: 10 fL (ref 7.5–12.5)
NEUTROS ABS: 4216 {cells}/uL (ref 1500–7800)
Neutrophils Relative %: 62 %
Platelets: 184 10*3/uL (ref 140–400)
RBC: 4.66 10*6/uL (ref 4.20–5.80)
RDW: 12 % (ref 11.0–15.0)
Total Lymphocyte: 31.5 %
WBC mixed population: 347 cells/uL (ref 200–950)
WBC: 6.8 10*3/uL (ref 3.8–10.8)

## 2018-10-14 LAB — LIPID PANEL
Cholesterol: 181 mg/dL (ref <200)
HDL: 43 mg/dL (ref >40)
LDL Cholesterol (Calc): 111 mg/dL (calc) — ABNORMAL HIGH
Non-HDL Cholesterol (Calc): 138 mg/dL (calc) — ABNORMAL HIGH (ref <130)
TRIGLYCERIDES: 154 mg/dL — AB (ref <150)
Total CHOL/HDL Ratio: 4.2 (calc) (ref <5.0)

## 2018-10-14 LAB — HEMOGLOBIN A1C
HEMOGLOBIN A1C: 9.5 %{Hb} — AB (ref <5.7)
Mean Plasma Glucose: 226 (calc)
eAG (mmol/L): 12.5 (calc)

## 2018-10-14 LAB — IRON, TOTAL/TOTAL IRON BINDING CAP
%SAT: 39 % (calc) (ref 20–48)
IRON: 135 ug/dL (ref 50–180)
TIBC: 350 mcg/dL (calc) (ref 250–425)

## 2018-10-14 LAB — TSH: TSH: 1.3 m[IU]/L (ref 0.40–4.50)

## 2018-10-14 LAB — COMPLETE METABOLIC PANEL WITH GFR
AG RATIO: 1.7 (calc) (ref 1.0–2.5)
ALBUMIN MSPROF: 4.7 g/dL (ref 3.6–5.1)
ALKALINE PHOSPHATASE (APISO): 69 U/L (ref 40–115)
ALT: 14 U/L (ref 9–46)
AST: 15 U/L (ref 10–35)
BILIRUBIN TOTAL: 0.6 mg/dL (ref 0.2–1.2)
BUN: 15 mg/dL (ref 7–25)
CO2: 28 mmol/L (ref 20–32)
Calcium: 10.1 mg/dL (ref 8.6–10.3)
Chloride: 102 mmol/L (ref 98–110)
Creat: 1.15 mg/dL (ref 0.70–1.25)
GFR, Est African American: 79 mL/min/{1.73_m2} (ref >=60)
GFR, Est Non African American: 68 mL/min/{1.73_m2} (ref >=60)
GLOBULIN: 2.8 g/dL (ref 1.9–3.7)
GLUCOSE: 203 mg/dL — AB (ref 65–99)
Potassium: 4.2 mmol/L (ref 3.5–5.3)
Sodium: 139 mmol/L (ref 135–146)
TOTAL PROTEIN: 7.5 g/dL (ref 6.1–8.1)

## 2018-10-14 LAB — URINALYSIS, ROUTINE W REFLEX MICROSCOPIC
Bilirubin Urine: NEGATIVE
Hgb urine dipstick: NEGATIVE
LEUKOCYTES UA: NEGATIVE
NITRITE: NEGATIVE
Protein, ur: NEGATIVE
Specific Gravity, Urine: 1.038 — ABNORMAL HIGH (ref 1.001–1.03)
pH: 6 (ref 5.0–8.0)

## 2018-10-14 LAB — MICROALBUMIN / CREATININE URINE RATIO
Creatinine, Urine: 165 mg/dL (ref 20–320)
Microalb Creat Ratio: 13 mcg/mg creat (ref <30)
Microalb, Ur: 2.1 mg/dL

## 2018-10-14 LAB — MAGNESIUM: MAGNESIUM: 2 mg/dL (ref 1.5–2.5)

## 2018-10-14 LAB — PSA: PSA: 0.7 ng/mL (ref <=4.0)

## 2018-10-14 LAB — INSULIN, RANDOM: INSULIN: 8.7 u[IU]/mL (ref 2.0–19.6)

## 2018-10-14 LAB — TESTOSTERONE: Testosterone: 382 ng/dL (ref 250–827)

## 2018-10-14 LAB — VITAMIN D 25 HYDROXY (VIT D DEFICIENCY, FRACTURES): VIT D 25 HYDROXY: 66 ng/mL (ref 30–100)

## 2018-10-14 LAB — VITAMIN B12: Vitamin B-12: 403 pg/mL (ref 200–1100)

## 2018-10-16 ENCOUNTER — Encounter: Payer: Self-pay | Admitting: Internal Medicine

## 2018-10-17 ENCOUNTER — Other Ambulatory Visit: Payer: Self-pay | Admitting: Internal Medicine

## 2018-10-17 LAB — GLUTAMIC ACID DECARBOXYLASE AUTO ABS

## 2018-10-22 LAB — HM DIABETES EYE EXAM

## 2018-11-04 ENCOUNTER — Encounter: Payer: Self-pay | Admitting: *Deleted

## 2018-12-04 ENCOUNTER — Other Ambulatory Visit: Payer: Self-pay | Admitting: Internal Medicine

## 2019-02-11 ENCOUNTER — Ambulatory Visit: Payer: Self-pay | Admitting: Adult Health

## 2019-05-13 ENCOUNTER — Encounter: Payer: Self-pay | Admitting: Internal Medicine

## 2019-05-13 NOTE — Progress Notes (Signed)
THIS ENCOUNTER IS A VIRTUAL VISIT DUE TO COVID-19 - PATIENT WAS NOT SEEN IN THE OFFICE.  PATIENT HAS CONSENTED TO VIRTUAL VISIT / TELEMEDICINE VISIT  This provider placed a call to Zachary Reid using telephone, his appointment was changed to a virtual office visit to reduce the risk of exposure to the COVID-19 virus and to help Zachary Reid remain healthy and safe. The virtual visit will also provide continuity of care. He verbalizes understanding.   Virtual Visit via telephone Note  I connected with  Zachary Reid  on 05/14/19  by telephone.  I verified that I am speaking with the correct person using two identifiers.        I discussed the limitations of evaluation and management by telemedicine and the availability of in person appointments. The patient expressed understanding and agreed to proceed.  History of Present Illness:      This very nice 63 y.o. MWM presents for  follow up with HTN, HLD, T2_DM and Vitamin D Deficiency. 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       Patient has hx/o labile HTN  Followed expectantly since 2013. BP has been controlled. Patient has had no complaints of any cardiac type chest pain, palpitations, dyspnea / orthopnea / PND, dizziness, claudication, or dependent edema.      Hyperlipidemia is not controlled with diet & Simvastatin. Patient denies myalgias or other med SE's. Last Lipids were not at goal: Lab Results  Component Value Date   CHOL 181 10/13/2018   HDL 43 10/13/2018   LDLCALC 111 (H) 10/13/2018   TRIG 154 (H) 10/13/2018   CHOLHDL 4.2 10/13/2018       Patient also has hx/o low Testosterone ("206" / Sept 2017) & is on parenteral replacement with improvement with  libido, stamina & sense of well being.      Also, the patient has  history of T2_DM (A1c 7.3% / 2012)  and has had no symptoms of reactive hypoglycemia, diabetic polys, paresthesias or visual blurring. Dietary compliance is admitted poor.  He reports his 30 day average glucose is 220 mg%, but apparently is not fasting but randomly and post prandial. Last A1c was not at goal: Lab Results  Component Value Date   HGBA1C 9.5 (H) 10/13/2018       Further, the patient also has history of Vitamin D Deficiency("44" / 2013) and supplements vitamin D without any suspected side-effects. Last vitamin D was at goal: Lab Results  Component Value Date   VD25OH 66 10/13/2018   Current Outpatient Medications on File Prior to Visit  Medication Sig  . APPLE CIDER VINEGAR PO Take by mouth.  Marland Kitchen aspirin 81 MG tablet Take 81 mg by mouth 2 (two) times daily.   . Cholecalciferol (VITAMIN D PO) Take 10,000 Units daily by mouth.   . Cinnamon 500 MG capsule Take 500 mg daily by mouth.  . gabapentin (NEURONTIN) 100 MG capsule TAKE 1 CAPSULE BY MOUTH THREE TIMES A DAY  . glucose blood test strip Use as instructed  . hyoscyamine (LEVSIN SL) 0.125 MG SL tablet DISSOLVE ONE TABLET UNDER THE TONGUE 4 TIMES DAILY UP TO EVERY 4 HOURS AS NEEDED FOR NAUSEA, BLOATING, CRAMPING, OR DIARRHEA  . metFORMIN (GLUCOPHAGE-XR) 500 MG 24 hr tablet TAKE 2 TABLETS  TWICE A DAY FOR DIABETES  . RED YEAST RICE EXTRACT PO Take 1 capsule by mouth.  . simvastatin (ZOCOR) 80 MG tablet TAKE 1 TABLET (80 MG TOTAL) BY MOUTH DAILY.   No current facility-administered medications on file prior to visit.    Allergies  Allergen Reactions  . Lipitor [Atorvastatin]     fatigue   PMHx:   Past Medical History:  Diagnosis Date  . Abnormal EKG   . DM (diabetes mellitus) ([place])   . Hyperlipidemia   . Hypogonadism male   . IBS (irritable bowel syndrome)   . Obesity    BMI 31  . Vitamin D deficiency    Immunization History  Administered Date(s) Administered  . Influenza-Unspecified 09/02/2015, 09/24/2017,  09/09/2018  . PPD Test 05/24/2014, 07/18/2015, 08/08/2016, 09/11/2017, 10/13/2018  . Pneumococcal Polysaccharide-23 10/13/2018  . Pneumococcal-Unspecified 05/17/2013  . Tdap 05/17/2013   Past Surgical History:  Procedure Laterality Date  . APPENDECTOMY    . JOINT REPLACEMENT     pt. denies   . SHOULDER SURGERY Right    FHx:    Reviewed / unchanged  SHx:    Reviewed / unchanged   Systems Review:  Constitutional: Denies fever, chills, wt changes, headaches, insomnia, fatigue, night sweats, change in appetite. Eyes: Denies redness, blurred vision, diplopia, discharge, itchy, watery eyes.  ENT: Denies discharge, congestion, post nasal drip, epistaxis, sore throat, earache, hearing loss, dental pain, tinnitus, vertigo, sinus pain, snoring.  CV: Denies chest pain, palpitations, irregular heartbeat, syncope, dyspnea, diaphoresis, orthopnea, PND, claudication or edema. Respiratory: denies cough, dyspnea, DOE, pleurisy, hoarseness, laryngitis, wheezing.  Gastrointestinal: Denies dysphagia, odynophagia, heartburn, reflux, water brash, abdominal pain or cramps, nausea, vomiting, bloating, diarrhea, constipation, hematemesis, melena, hematochezia  or hemorrhoids. Genitourinary: Denies dysuria, frequency, urgency, nocturia, hesitancy, discharge, hematuria or flank pain. Musculoskeletal: Denies arthralgias, myalgias, stiffness, jt. swelling, pain, limping or strain/sprain.  Skin: Denies pruritus, rash, hives, warts, acne, eczema or change in skin lesion(s). Neuro: No weakness, tremor, incoordination, spasms, paresthesia or pain. Psychiatric: Denies confusion, memory loss or sensory loss. Endo: Denies change in weight, skin or hair change.  Heme/Lymph: No excessive bleeding, bruising or enlarged lymph nodes.  Physical Exam  Wt 183 lb (83 kg)   BMI 26.26 kg/m   General : Well sounding patient in no apparent distress HEENT: no hoarseness, no cough for duration of visit Lungs: speaks in  complete sentences, no audible wheezing, no apparent distress Neurological: alert, oriented x 3 Psychiatric: pleasant, judgement appropriate   Assessment and Plan:  1. Labile hypertension  - Continue medication, monitor blood pressure at home.  - Continue DASH diet.  Reminder to go to the ER if any CP,  SOB, nausea, dizziness, severe HA, changes vision/speech.  2. Hyperlipidemia, mixed  - Continue diet/meds, exercise,& lifestyle modifications.  - Continue monitor periodic cholesterol/liver & renal functions   3. Uncontrolled type 2 diabetes mellitus with stage 2 chronic kidney disease, without long-term current use of insulin (HCC)  - Continue diet, exercise  - Lifestyle modifications.  - Monitor appropriate labs.  4. Vitamin D deficiency  - Continue supplementation.  5. Testosterone Deficiency  - Patient has deferred therapy       Discussed  regular exercise, BP monitoring, weight control to achieve/maintain BMI less than 25 and discussed med and SE's. Recommended patient schedule a lab visit to check A1c, Lipid Profile & CHEM/24.  I discussed the assessment and treatment plan with the patient. The patient was provided an opportunity to ask questions and all  were answered. The patient agreed with the plan and demonstrated an understanding of the instructions. I provided 33  minutes of non-face-to-face time during this encounter and over 40 minutes of exam, counseling, chart review and  complex critical decision making was performed        The patient was advised to call back or seek an in-person evaluation if the symptoms worsen or if the condition fails to improve as anticipated.   Kirtland Bouchard, MD

## 2019-05-13 NOTE — Patient Instructions (Signed)

## 2019-05-14 ENCOUNTER — Other Ambulatory Visit: Payer: Self-pay | Admitting: Internal Medicine

## 2019-05-14 ENCOUNTER — Ambulatory Visit: Payer: Self-pay | Admitting: Internal Medicine

## 2019-05-14 ENCOUNTER — Ambulatory Visit: Payer: BLUE CROSS/BLUE SHIELD | Admitting: Internal Medicine

## 2019-05-14 ENCOUNTER — Encounter: Payer: Self-pay | Admitting: Internal Medicine

## 2019-05-14 VITALS — Wt 183.0 lb

## 2019-05-14 DIAGNOSIS — E1165 Type 2 diabetes mellitus with hyperglycemia: Secondary | ICD-10-CM

## 2019-05-14 DIAGNOSIS — E782 Mixed hyperlipidemia: Secondary | ICD-10-CM

## 2019-05-14 DIAGNOSIS — IMO0002 Reserved for concepts with insufficient information to code with codable children: Secondary | ICD-10-CM

## 2019-05-14 DIAGNOSIS — E291 Testicular hypofunction: Secondary | ICD-10-CM

## 2019-05-14 DIAGNOSIS — R0989 Other specified symptoms and signs involving the circulatory and respiratory systems: Secondary | ICD-10-CM | POA: Diagnosis not present

## 2019-05-14 DIAGNOSIS — E559 Vitamin D deficiency, unspecified: Secondary | ICD-10-CM

## 2019-05-14 DIAGNOSIS — E1122 Type 2 diabetes mellitus with diabetic chronic kidney disease: Secondary | ICD-10-CM | POA: Diagnosis not present

## 2019-05-14 DIAGNOSIS — N182 Chronic kidney disease, stage 2 (mild): Secondary | ICD-10-CM

## 2019-05-14 DIAGNOSIS — Z79899 Other long term (current) drug therapy: Secondary | ICD-10-CM

## 2019-05-17 ENCOUNTER — Other Ambulatory Visit: Payer: Self-pay

## 2019-05-25 ENCOUNTER — Other Ambulatory Visit: Payer: Self-pay

## 2019-05-25 ENCOUNTER — Other Ambulatory Visit: Payer: BLUE CROSS/BLUE SHIELD

## 2019-05-25 DIAGNOSIS — IMO0002 Reserved for concepts with insufficient information to code with codable children: Secondary | ICD-10-CM

## 2019-05-25 DIAGNOSIS — E782 Mixed hyperlipidemia: Secondary | ICD-10-CM

## 2019-05-25 DIAGNOSIS — E1122 Type 2 diabetes mellitus with diabetic chronic kidney disease: Secondary | ICD-10-CM

## 2019-05-25 DIAGNOSIS — Z79899 Other long term (current) drug therapy: Secondary | ICD-10-CM

## 2019-05-25 DIAGNOSIS — R0989 Other specified symptoms and signs involving the circulatory and respiratory systems: Secondary | ICD-10-CM

## 2019-05-26 ENCOUNTER — Other Ambulatory Visit: Payer: Self-pay | Admitting: Internal Medicine

## 2019-05-26 LAB — COMPLETE METABOLIC PANEL WITH GFR
AG Ratio: 1.9 (calc) (ref 1.0–2.5)
ALT: 16 U/L (ref 9–46)
AST: 11 U/L (ref 10–35)
Albumin: 4.6 g/dL (ref 3.6–5.1)
Alkaline phosphatase (APISO): 68 U/L (ref 35–144)
BUN: 19 mg/dL (ref 7–25)
CO2: 28 mmol/L (ref 20–32)
Calcium: 9.9 mg/dL (ref 8.6–10.3)
Chloride: 102 mmol/L (ref 98–110)
Creat: 0.8 mg/dL (ref 0.70–1.25)
GFR, Est African American: 111 mL/min/{1.73_m2} (ref >=60)
GFR, Est Non African American: 96 mL/min/{1.73_m2} (ref >=60)
Globulin: 2.4 g/dL (calc) (ref 1.9–3.7)
Glucose, Bld: 256 mg/dL — ABNORMAL HIGH (ref 65–99)
Potassium: 4.3 mmol/L (ref 3.5–5.3)
Sodium: 138 mmol/L (ref 135–146)
Total Bilirubin: 0.5 mg/dL (ref 0.2–1.2)
Total Protein: 7 g/dL (ref 6.1–8.1)

## 2019-05-26 LAB — LIPID PANEL
Cholesterol: 165 mg/dL (ref <200)
HDL: 45 mg/dL (ref >=40)
LDL Cholesterol (Calc): 96 mg/dL (calc)
Non-HDL Cholesterol (Calc): 120 mg/dL (calc) (ref <130)
Total CHOL/HDL Ratio: 3.7 (calc) (ref <5.0)
Triglycerides: 144 mg/dL (ref <150)

## 2019-05-26 LAB — HEMOGLOBIN A1C
Hgb A1c MFr Bld: 9.4 % of total Hgb — ABNORMAL HIGH (ref <5.7)
Mean Plasma Glucose: 223 (calc)
eAG (mmol/L): 12.4 (calc)

## 2019-05-26 MED ORDER — GLIPIZIDE 5 MG PO TABS: ORAL_TABLET | ORAL | 2 refills | Status: DC

## 2019-06-07 ENCOUNTER — Other Ambulatory Visit: Payer: Self-pay | Admitting: Adult Health

## 2019-06-19 ENCOUNTER — Other Ambulatory Visit: Payer: Self-pay | Admitting: Internal Medicine

## 2019-10-07 ENCOUNTER — Other Ambulatory Visit: Payer: Self-pay | Admitting: Internal Medicine

## 2019-10-07 ENCOUNTER — Other Ambulatory Visit: Payer: Self-pay | Admitting: *Deleted

## 2019-10-07 MED ORDER — GLIPIZIDE 5 MG PO TABS: ORAL_TABLET | ORAL | 0 refills | Status: DC

## 2019-11-07 NOTE — Patient Instructions (Signed)

## 2019-11-07 NOTE — Progress Notes (Signed)
Annual  Screening/Preventative Visit  & Comprehensive Evaluation & Examination     This very nice 63 y.o. MWM presents for a Screening /Preventative Visit & comprehensive evaluation and management of multiple medical co-morbidities.  Patient has been followed for HTN, HLD, T2_NIDDM  and Vitamin D Deficiency.     Patient has been followed expectantly for labile HTN since 2013. Patient's BP has been controlled at home.  Today's BP is at goal -  132/84. Patient denies any cardiac symptoms as chest pain, palpitations, shortness of breath, dizziness or ankle swelling.     Patient's hyperlipidemia is controlled with diet and medications. Patient denies myalgias or other medication SE's. Last lipids were at goal:  Lab Results  Component Value Date   CHOL 165 05/25/2019   HDL 45 05/25/2019   LDLCALC 96 05/25/2019   TRIG 144 05/25/2019   CHOLHDL 3.7 05/25/2019      Patient has hx/o T2_NIDDM ( A1c 7.3% / 2012)   and dietary compliance has been poor. and patient denies reactive hypoglycemic symptoms, visual blurring, diabetic polys or paresthesias. Last A1c was not at goal:  Lab Results  Component Value Date   HGBA1C 9.4 (H) 05/25/2019    Wt Readings from Last 3 Encounters:  11/08/19 196 lb 6.4 oz (89.1 kg)  05/14/19 183 lb (83 kg)  10/13/18 193 lb 12.8 oz (87.9 kg)         Patient was dx'd low Testosterone  (level "206" / Sept 2017) & had ben  on parenteral  replacement but apparently has stopped replacement.      Finally, patient has history of Vitamin D Deficiency ("44"/2013)  and last vitamin D was at goal:  Lab Results  Component Value Date   VD25OH 66 10/13/2018   Current Outpatient Medications on File Prior to Visit  Medication Sig  . APPLE CIDER VINEGAR PO Take by mouth.  Marland Kitchen aspirin 81 MG tablet Take 81 mg by mouth 2 (two) times daily.   . Cholecalciferol (VITAMIN D PO) Take 10,000 Units daily by mouth.   . Cinnamon 500 MG capsule Take 500 mg daily by mouth.  Marland Kitchen glipiZIDE  (GLUCOTROL) 5 MG tablet Take 1 tablet 2 to 3 x /day with Meals for Diabetes  . glucose blood test strip Use as instructed  . hyoscyamine (LEVSIN SL) 0.125 MG SL tablet DISSOLVE ONE TABLET UNDER THE TONGUE 4 TIMES DAILY UP TO EVERY 4 HOURS AS NEEDED FOR NAUSEA, BLOATING, CRAMPING, OR DIARRHEA  . metFORMIN (GLUCOPHAGE-XR) 500 MG 24 hr tablet Take 2 tablets 2 x /day with Meals for Diabetes  . RED YEAST RICE EXTRACT PO Take 1 capsule by mouth.   No current facility-administered medications on file prior to visit.   Allergies  Allergen Reactions  . Lipitor [Atorvastatin]     fatigue   Past Medical History:  Diagnosis Date  . Abnormal EKG   . DM (diabetes mellitus) ([place])   . Hyperlipidemia   . Hypogonadism male   . IBS (irritable bowel syndrome)   . Obesity    BMI 31  . Vitamin D deficiency    Health Maintenance  Topic Date Due  . COLONOSCOPY  11/22/2015  . URINE MICROALBUMIN  10/14/2019  . OPHTHALMOLOGY EXAM  10/23/2019  . HEMOGLOBIN A1C  11/25/2019  . FOOT EXAM  11/06/2020  . TETANUS/TDAP  05/18/2023  . INFLUENZA VACCINE  Completed  . PNEUMOCOCCAL POLYSACCHARIDE VACCINE AGE 44-64 HIGH RISK  Completed  . Hepatitis C Screening  Completed  .  HIV Screening  Completed   Immunization History  Administered Date(s) Administered  . Influenza,inj,Quad PF,6+ Mos 09/24/2017, 09/09/2018, 10/13/2019  . Influenza-Unspecified 09/02/2015, 09/24/2017, 09/09/2018  . PPD Test 05/24/2014, 07/18/2015, 08/08/2016, 09/11/2017, 10/13/2018, 11/08/2019  . Pneumococcal Polysaccharide-23 10/13/2018  . Pneumococcal-Unspecified 05/17/2013  . Tdap 05/17/2013   Last Colon - 11/21/2005 -    Recc 10 yr f/u  Past Surgical History:  Procedure Laterality Date  . APPENDECTOMY    . JOINT REPLACEMENT     pt. denies   . SHOULDER SURGERY Right    Family History  Problem Relation Age of Onset  . Pancreatic cancer Mother   . Prostate cancer Father        Prostate  . Diabetes Father   . Hypertension Father    . Drug abuse Brother   . Kidney Stones Brother   . Ovarian cancer Paternal Grandmother   . Lung cancer Paternal Grandfather        Cigar smoker  . Colon cancer Neg Hx   . Colon polyps Neg Hx   . Esophageal cancer Neg Hx   . Stomach cancer Neg Hx   . Rectal cancer Neg Hx    Social History   Socioeconomic History  . Marital status: Married    Spouse name: Mickel Baas  . Number of children: 1 daughter  Occupational History  . 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 Smoking status: Never Smoker  . Smokeless tobacco: Never Used  Substance and Sexual Activity  . Alcohol use: No  . Drug use: No  . Sexual activity: Active     ROS Constitutional: Denies fever, chills, weight loss/gain, headaches, insomnia,  night sweats or change in appetite. Does c/o fatigue. Eyes: Denies redness, blurred vision, diplopia, discharge, itchy or watery eyes.  ENT: Denies discharge, congestion, post nasal drip, epistaxis, sore throat, earache, hearing loss, dental pain, Tinnitus, Vertigo, Sinus pain or snoring.  Cardio: Denies chest pain, palpitations, irregular heartbeat, syncope, dyspnea, diaphoresis, orthopnea, PND, claudication or edema Respiratory: denies cough, dyspnea, DOE, pleurisy, hoarseness, laryngitis or wheezing.  Gastrointestinal: Denies dysphagia, heartburn, reflux, water brash, pain, cramps, nausea, vomiting, bloating, diarrhea, constipation, hematemesis, melena, hematochezia, jaundice or hemorrhoids Genitourinary: Denies dysuria, frequency, urgency, nocturia, hesitancy, discharge, hematuria or flank pain Musculoskeletal: Denies arthralgia, myalgia, stiffness, Jt. Swelling, pain, limp or strain/sprain. Denies Falls. Skin: Denies puritis, rash, hives, warts, acne, eczema or change in skin lesion Neuro: No weakness, tremor, incoordination, spasms, paresthesia or pain Psychiatric: Denies confusion, memory loss or sensory loss. Denies Depression. Endocrine:  Denies change in weight, skin, hair change, nocturia, and paresthesia, diabetic polys, visual blurring or hyper / hypo glycemic episodes.  Heme/Lymph: No excessive bleeding, bruising or enlarged lymph nodes.  Physical Exam  BP 132/84   Pulse 72   Temp (!) 97.2 F (36.2 C)   Resp 16   Ht 5\' 11"  (1.803 m)   Wt 196 lb 6.4 oz (89.1 kg)   BMI 27.39 kg/m   General Appearance: Well nourished and well groomed and in no apparent distress.  Eyes: PERRLA, EOMs, conjunctiva no swelling or erythema, normal fundi and vessels. Sinuses: No frontal/maxillary tenderness ENT/Mouth: EACs patent / TMs  nl. Nares clear without erythema, swelling, mucoid exudates. Oral hygiene is good. No erythema, swelling, or exudate. Tongue normal, non-obstructing. Tonsils not swollen or erythematous. Hearing normal.  Neck: Supple, thyroid not palpable. No bruits, nodes or JVD. Respiratory: Respiratory effort normal.  BS equal and clear bilateral without  rales, rhonci, wheezing or stridor. Cardio: Heart sounds are normal with regular rate and rhythm and no murmurs, rubs or gallops. Peripheral pulses are normal and equal bilaterally without edema. No aortic or femoral bruits. Chest: symmetric with normal excursions and percussion.  Abdomen: Soft, with Nl bowel sounds. Nontender, no guarding, rebound, hernias, masses, or organomegaly.  Lymphatics: Non tender without lymphadenopathy.  Musculoskeletal: Full ROM all peripheral extremities, joint stability, 5/5 strength, and normal gait. Skin: Warm and dry without rashes, lesions, cyanosis, clubbing or  ecchymosis.  Neuro: Cranial nerves intact, reflexes equal bilaterally. Normal muscle tone, no cerebellar symptoms. Sensation intact to touch, vibratory and Monofilament to the toes bilaterally. Pysch: Alert and oriented X 3 with normal affect, insight and judgment appropriate.   Assessment and Plan  1. Annual Preventative/Screening Exam    2. Labile hypertension  - EKG  12-Lead - [place], retroperitnl abd,  ltd - Urinalysis, Routine w reflex microscopic - Microalbumin / Creatinine Urine Ratio - CBC with Diff - COMPLETE METABOLIC PANEL WITH GFR - Magnesium - TSH  3. Hyperlipidemia associated with type 2 diabetes mellitus (HCC)  - EKG 12-Lead - [place], retroperitnl abd,  ltd - Lipid Profile - TSH  4. Uncontrolled type 2 diabetes mellitus with stage 2 chronic kidney disease, without long-term current use of insulin (HCC)  - EKG 12-Lead - [place], retroperitnl abd,  ltd - Urinalysis, Routine w reflex microscopic - Microalbumin / Creatinine Urine Ratio - HM DIABETES FOOT EXAM - LOW EXTREMITY NEUR EXAM DOCUM - Hemoglobin A1c ([facility]) - Insulin, random  5. Vitamin D deficiency  - Vitamin D (25 hydroxy)  6. Testosterone Deficiency  - Testosterone, Total  7. Screening for colorectal cancer  - POC Hemoccult Bld/Stl (  8. Prostate cancer screening  - PSA  9. BPH with obstruction/lower urinary tract symptoms  - PSA  10. Screening examination for pulmonary tuberculosis  - TB Skin Test  11. Screening for ischemic heart disease  - EKG 12-Lead  12. FH: hypertension  - EKG 12-Lead - [place], retroperitnl abd,  ltd  13. Screening for AAA (aortic abdominal aneurysm)  - [place], retroperitnl abd,  ltd  14. Fatigue, unspecified type  - Iron,Total/Total Iron Binding Cap - Vitamin B12 - Testosterone, Total - CBC with Diff  15. Medication management  - Urinalysis, Routine w reflex microscopic - Microalbumin / Creatinine Urine Ratio - Testosterone, Total - CBC with Diff - COMPLETE METABOLIC PANEL WITH GFR - Magnesium - Lipid Profile - TSH - Hemoglobin A1c ([facility]) - Insulin, random - Vitamin D (25 hydroxy)        Patient was counseled in prudent diet, weight control to achieve/maintain BMI less than 25, BP monitoring, regular exercise and medications as discussed.  Discussed med effects and SE's. Routine screening labs and tests as requested  with regular follow-up as recommended. Over 40 minutes of exam, counseling, chart review and high complex critical decision making was performed   Kirtland Bouchard, MD

## 2019-11-08 ENCOUNTER — Other Ambulatory Visit: Payer: Self-pay

## 2019-11-08 ENCOUNTER — Encounter: Payer: Self-pay | Admitting: Internal Medicine

## 2019-11-08 ENCOUNTER — Ambulatory Visit (INDEPENDENT_AMBULATORY_CARE_PROVIDER_SITE_OTHER): Payer: BLUE CROSS/BLUE SHIELD | Admitting: Internal Medicine

## 2019-11-08 VITALS — BP 132/84 | HR 72 | Temp 97.2°F | Resp 16 | Ht 71.0 in | Wt 196.4 lb

## 2019-11-08 DIAGNOSIS — Z13220 Encounter for screening for lipoid disorders: Secondary | ICD-10-CM | POA: Diagnosis not present

## 2019-11-08 DIAGNOSIS — R0989 Other specified symptoms and signs involving the circulatory and respiratory systems: Secondary | ICD-10-CM

## 2019-11-08 DIAGNOSIS — Z0000 Encounter for general adult medical examination without abnormal findings: Secondary | ICD-10-CM

## 2019-11-08 DIAGNOSIS — Z1389 Encounter for screening for other disorder: Secondary | ICD-10-CM

## 2019-11-08 DIAGNOSIS — Z0001 Encounter for general adult medical examination with abnormal findings: Secondary | ICD-10-CM

## 2019-11-08 DIAGNOSIS — I10 Essential (primary) hypertension: Secondary | ICD-10-CM

## 2019-11-08 DIAGNOSIS — Z130 Encounter for screening for diseases of the blood and blood-forming organs and certain disorders involving the immune mechanism: Secondary | ICD-10-CM

## 2019-11-08 DIAGNOSIS — R350 Frequency of micturition: Secondary | ICD-10-CM

## 2019-11-08 DIAGNOSIS — Z1329 Encounter for screening for other suspected endocrine disorder: Secondary | ICD-10-CM

## 2019-11-08 DIAGNOSIS — R5383 Other fatigue: Secondary | ICD-10-CM

## 2019-11-08 DIAGNOSIS — Z79899 Other long term (current) drug therapy: Secondary | ICD-10-CM

## 2019-11-08 DIAGNOSIS — N138 Other obstructive and reflux uropathy: Secondary | ICD-10-CM

## 2019-11-08 DIAGNOSIS — Z131 Encounter for screening for diabetes mellitus: Secondary | ICD-10-CM

## 2019-11-08 DIAGNOSIS — Z136 Encounter for screening for cardiovascular disorders: Secondary | ICD-10-CM

## 2019-11-08 DIAGNOSIS — IMO0002 Reserved for concepts with insufficient information to code with codable children: Secondary | ICD-10-CM

## 2019-11-08 DIAGNOSIS — Z8249 Family history of ischemic heart disease and other diseases of the circulatory system: Secondary | ICD-10-CM

## 2019-11-08 DIAGNOSIS — E1122 Type 2 diabetes mellitus with diabetic chronic kidney disease: Secondary | ICD-10-CM

## 2019-11-08 DIAGNOSIS — N401 Enlarged prostate with lower urinary tract symptoms: Secondary | ICD-10-CM

## 2019-11-08 DIAGNOSIS — Z1211 Encounter for screening for malignant neoplasm of colon: Secondary | ICD-10-CM

## 2019-11-08 DIAGNOSIS — Z111 Encounter for screening for respiratory tuberculosis: Secondary | ICD-10-CM

## 2019-11-08 DIAGNOSIS — E559 Vitamin D deficiency, unspecified: Secondary | ICD-10-CM

## 2019-11-08 DIAGNOSIS — Z125 Encounter for screening for malignant neoplasm of prostate: Secondary | ICD-10-CM

## 2019-11-08 DIAGNOSIS — E1169 Type 2 diabetes mellitus with other specified complication: Secondary | ICD-10-CM

## 2019-11-08 DIAGNOSIS — E291 Testicular hypofunction: Secondary | ICD-10-CM

## 2019-11-09 LAB — IRON, TOTAL/TOTAL IRON BINDING CAP
%SAT: 33 % (calc) (ref 20–48)
Iron: 120 ug/dL (ref 50–180)
TIBC: 359 mcg/dL (calc) (ref 250–425)

## 2019-11-09 LAB — VITAMIN D 25 HYDROXY (VIT D DEFICIENCY, FRACTURES): Vit D, 25-Hydroxy: 86 ng/mL (ref 30–100)

## 2019-11-09 LAB — VITAMIN B12: Vitamin B-12: 396 pg/mL (ref 200–1100)

## 2019-11-09 LAB — COMPLETE METABOLIC PANEL WITH GFR
AG Ratio: 1.8 (calc) (ref 1.0–2.5)
ALT: 13 U/L (ref 9–46)
AST: 13 U/L (ref 10–35)
Albumin: 4.5 g/dL (ref 3.6–5.1)
Alkaline phosphatase (APISO): 63 U/L (ref 35–144)
BUN: 20 mg/dL (ref 7–25)
CO2: 26 mmol/L (ref 20–32)
Calcium: 9.6 mg/dL (ref 8.6–10.3)
Chloride: 104 mmol/L (ref 98–110)
Creat: 1 mg/dL (ref 0.70–1.25)
GFR, Est African American: 92 mL/min/{1.73_m2} (ref >=60)
GFR, Est Non African American: 80 mL/min/{1.73_m2} (ref >=60)
Globulin: 2.5 g/dL (calc) (ref 1.9–3.7)
Glucose, Bld: 143 mg/dL — ABNORMAL HIGH (ref 65–99)
Potassium: 4.2 mmol/L (ref 3.5–5.3)
Sodium: 139 mmol/L (ref 135–146)
Total Bilirubin: 0.5 mg/dL (ref 0.2–1.2)
Total Protein: 7 g/dL (ref 6.1–8.1)

## 2019-11-09 LAB — URINALYSIS, ROUTINE W REFLEX MICROSCOPIC
Bilirubin Urine: NEGATIVE
Hgb urine dipstick: NEGATIVE
Leukocytes,Ua: NEGATIVE
Nitrite: NEGATIVE
Protein, ur: NEGATIVE
Specific Gravity, Urine: 1.02 (ref 1.001–1.03)
pH: 7 (ref 5.0–8.0)

## 2019-11-09 LAB — CBC WITH DIFFERENTIAL/PLATELET
Absolute Monocytes: 449 cells/uL (ref 200–950)
Basophils Absolute: 27 cells/uL (ref 0–200)
Basophils Relative: 0.4 %
Eosinophils Absolute: 102 cells/uL (ref 15–500)
Eosinophils Relative: 1.5 %
HCT: 42.5 % (ref 38.5–50.0)
Hemoglobin: 14.8 g/dL (ref 13.2–17.1)
Lymphs Abs: 1809 cells/uL (ref 850–3900)
MCH: 32.6 pg (ref 27.0–33.0)
MCHC: 34.8 g/dL (ref 32.0–36.0)
MCV: 93.6 fL (ref 80.0–100.0)
MPV: 9.9 fL (ref 7.5–12.5)
Monocytes Relative: 6.6 %
Neutro Abs: 4413 cells/uL (ref 1500–7800)
Neutrophils Relative %: 64.9 %
Platelets: 181 10*3/uL (ref 140–400)
RBC: 4.54 10*6/uL (ref 4.20–5.80)
RDW: 12.3 % (ref 11.0–15.0)
Total Lymphocyte: 26.6 %
WBC: 6.8 10*3/uL (ref 3.8–10.8)

## 2019-11-09 LAB — LIPID PANEL
Cholesterol: 186 mg/dL (ref <200)
HDL: 38 mg/dL — ABNORMAL LOW (ref >=40)
LDL Cholesterol (Calc): 118 mg/dL (calc) — ABNORMAL HIGH
Non-HDL Cholesterol (Calc): 148 mg/dL (calc) — ABNORMAL HIGH (ref <130)
Total CHOL/HDL Ratio: 4.9 (calc) (ref <5.0)
Triglycerides: 186 mg/dL — ABNORMAL HIGH (ref <150)

## 2019-11-09 LAB — MICROALBUMIN / CREATININE URINE RATIO
Creatinine, Urine: 94 mg/dL (ref 20–320)
Microalb Creat Ratio: 11 mcg/mg creat (ref <30)
Microalb, Ur: 1 mg/dL

## 2019-11-09 LAB — INSULIN, RANDOM: Insulin: 14.4 u[IU]/mL

## 2019-11-09 LAB — TESTOSTERONE: Testosterone: 423 ng/dL (ref 250–827)

## 2019-11-09 LAB — HEMOGLOBIN A1C
Hgb A1c MFr Bld: 7.6 % of total Hgb — ABNORMAL HIGH (ref <5.7)
Mean Plasma Glucose: 171 (calc)
eAG (mmol/L): 9.5 (calc)

## 2019-11-09 LAB — PSA: PSA: 0.8 ng/mL (ref <=4.0)

## 2019-11-09 LAB — MAGNESIUM: Magnesium: 2 mg/dL (ref 1.5–2.5)

## 2019-11-09 LAB — TSH: TSH: 1.2 mIU/L (ref 0.40–4.50)

## 2019-12-16 ENCOUNTER — Encounter: Payer: BLUE CROSS/BLUE SHIELD | Admitting: Internal Medicine

## 2020-02-10 NOTE — Progress Notes (Deleted)
FOLLOW UP  Assessment and Plan:   Hypertension Well controlled with current medications  Monitor blood pressure at home; patient to call if consistently greater than 130/80 Continue DASH diet.   Reminder to go to the ER if any CP, SOB, nausea, dizziness, severe HA, changes vision/speech, left arm numbness and tingling and jaw pain.  Hyperlipidemia associated with T2DM ([place]) Currently above LDL goal; statin *** Continue low cholesterol diet and exercise.  Check lipid panel.   Diabetes with diabetic chronic kidney disease ([place]) Continue medication: continue metformin; if elevated, wants to work on diet, did discuss SGLT 2 and GLP options today.  Continue diet and exercise.  Perform daily foot/skin check, notify office of any concerning changes.  Check A1C  CKD II associated with T2DM (HCC) Increase fluids, avoid NSAIDS, monitor sugars, will monitor  Overweight Long discussion about weight loss, diet, and exercise Recommended diet heavy in fruits and veggies and low in animal meats, cheeses, and dairy products, appropriate calorie intake Discussed ideal weight for height Patient will work on continue with portion control and start more intentional exercise Will follow up in 3 months  Vitamin D Def At goal at last visit; continue supplementation to maintain goal of 60-100 Defer Vit D level  Continue diet and meds as discussed. Further disposition pending results of labs. Discussed med's effects and SE's.   Over 30 minutes of exam, counseling, chart review, and critical decision making was performed.   Future Appointments  Date Time Provider [place]  02/14/2020  8:45 AM Liane Comber, NP [facility] None  05/16/2020  9:30 AM [name], MD [facility] None  12/05/2020  2:00 PM [name], MD [facility] None    ----------------------------------------------------------------------------------------------------------------------  HPI 64 y.o. male   presents for 3 month follow up on hypertension, cholesterol, T2 diabetes with CKD, weight and vitamin D deficiency.   BMI is There is no height or weight on file to calculate BMI., he has been watching portions, but no other particular interventions, no current intentional exercise. He does walk his dogs a few times a week for 30 min at the time.  Wt Readings from Last 3 Encounters:  11/08/19 196 lb 6.4 oz (89.1 kg)  05/14/19 183 lb (83 kg)  10/13/18 193 lb 12.8 oz (87.9 kg)   His blood pressure has been controlled at home, today their BP is    He does not workout. He denies chest pain, shortness of breath, dizziness.   He is not on cholesterol medication (simvastatin 80 mg ***, hx of myalgias with atorvastatin ***, taking red yeast rice ***) and denies myalgias. His cholesterol is not at goal. The cholesterol last visit was:   Lab Results  Component Value Date   CHOL 186 11/08/2019   HDL 38 (L) 11/08/2019   LDLCALC 118 (H) 11/08/2019   TRIG 186 (H) 11/08/2019   CHOLHDL 4.9 11/08/2019    He has not been working on diet and exercise for T2 diabetes with CKD, and denies increased appetite, nausea, paresthesia of the feet, polydipsia, polyuria, visual disturbances, vomiting and weight loss. He is taking *** mg of metformin daily ***? Couldn't tolerate 2000 mg, and glipizide 5 mg TID ***.  He reports he does check fasting glucose, and random sugars throughout the day and has been running high recently. He admits he has been not adhering to diet and can do much better.  Last A1C in the office was:  Lab Results  Component Value Date   HGBA1C 7.6 (H) 11/08/2019  He has CKD II associated with T2DM monitored at this office:  Lab Results  Component Value Date   GFRNONAA 80 11/08/2019   Patient is on Vitamin D supplement and at goal at recent check:    Lab Results  Component Value Date   VD25OH 86 11/08/2019     He has a history of testosterone deficiency and is on testosterone  replacement ***. He states that the testosterone helps with his energy, libido, muscle mass. Lab Results  Component Value Date   TESTOSTERONE 423 11/08/2019     Current Medications:  Current Outpatient Medications on File Prior to Visit  Medication Sig  . APPLE CIDER VINEGAR PO Take by mouth.  Marland Kitchen aspirin 81 MG tablet Take 81 mg by mouth 2 (two) times daily.   . Cholecalciferol (VITAMIN D PO) Take 10,000 Units daily by mouth.   . Cinnamon 500 MG capsule Take 500 mg daily by mouth.  Marland Kitchen glipiZIDE (GLUCOTROL) 5 MG tablet Take 1 tablet 2 to 3 x /day with Meals for Diabetes  . glucose blood test strip Use as instructed  . hyoscyamine (LEVSIN SL) 0.125 MG SL tablet DISSOLVE ONE TABLET UNDER THE TONGUE 4 TIMES DAILY UP TO EVERY 4 HOURS AS NEEDED FOR NAUSEA, BLOATING, CRAMPING, OR DIARRHEA  . metFORMIN (GLUCOPHAGE-XR) 500 MG 24 hr tablet Take 2 tablets 2 x /day with Meals for Diabetes  . RED YEAST RICE EXTRACT PO Take 1 capsule by mouth.   No current facility-administered medications on file prior to visit.     Allergies:  Allergies  Allergen Reactions  . Lipitor [Atorvastatin]     fatigue     Medical History:  Past Medical History:  Diagnosis Date  . Abnormal EKG   . DM (diabetes mellitus) ([place])   . Hyperlipidemia   . Hypogonadism male   . IBS (irritable bowel syndrome)   . Obesity    BMI 31  . Vitamin D deficiency    Family history- Reviewed and unchanged Social history- Reviewed and unchanged   Review of Systems:  Review of Systems  Constitutional: Negative for malaise/fatigue and weight loss.  HENT: Negative for hearing loss and tinnitus.   Eyes: Negative for blurred vision and double vision.  Respiratory: Negative for cough, shortness of breath and wheezing.   Cardiovascular: Negative for chest pain, palpitations, orthopnea, claudication and leg swelling.  Gastrointestinal: Negative for abdominal pain, blood in stool, constipation, diarrhea, heartburn, melena, nausea  and vomiting.  Genitourinary: Negative.   Musculoskeletal: Negative for joint pain and myalgias.  Skin: Negative for rash.  Neurological: Negative for dizziness, tingling, sensory change, weakness and headaches.  Endo/Heme/Allergies: Negative for polydipsia.  Psychiatric/Behavioral: Negative.   All other systems reviewed and are negative.   Physical Exam: There were no vitals taken for this visit. Wt Readings from Last 3 Encounters:  11/08/19 196 lb 6.4 oz (89.1 kg)  05/14/19 183 lb (83 kg)  10/13/18 193 lb 12.8 oz (87.9 kg)   General Appearance: Well nourished, in no apparent distress. Eyes: PERRLA, EOMs, conjunctiva no swelling or erythema Sinuses: No Frontal/maxillary tenderness ENT/Mouth: Ext aud canals clear, TMs without erythema, bulging. No erythema, swelling, or exudate on post pharynx.  Tonsils not swollen or erythematous. Hearing normal.  Neck: Supple, thyroid normal.  Respiratory: Respiratory effort normal, BS equal bilaterally without rales, rhonchi, wheezing or stridor.  Cardio: RRR with no MRGs. Brisk peripheral pulses without edema.  Abdomen: Soft, + BS.  Non tender, no guarding, rebound, hernias, masses. Lymphatics: Non tender  without lymphadenopathy.  Musculoskeletal: Full ROM, 5/5 strength, Normal gait Skin: Warm, dry without rashes, lesions, ecchymosis.  Neuro: Cranial nerves intact. No cerebellar symptoms.  Psych: Awake and oriented X 3, normal affect, Insight and Judgment appropriate.    Izora Ribas, NP 1:47 PM [facility]

## 2020-02-14 ENCOUNTER — Ambulatory Visit: Payer: BLUE CROSS/BLUE SHIELD | Admitting: Adult Health

## 2020-02-17 DEATH — deceased

## 2020-03-27 ENCOUNTER — Other Ambulatory Visit: Payer: Self-pay | Admitting: Internal Medicine

## 2020-05-15 ENCOUNTER — Encounter: Payer: Self-pay | Admitting: Internal Medicine

## 2020-05-15 NOTE — Patient Instructions (Signed)

## 2020-05-15 NOTE — Progress Notes (Signed)
History of Present Illness:       This very nice 64 y.o. MWM presents for 6 month follow up with HTN, HLD, T2_NIDDM  and Vitamin D Deficiency.  Today, p[atint is c/o intermittent bilat shoulder pains limiting his ROM.       Patient is followed for labile HTN (2013)  & BP has been controlled at home. Today's BP is at goal - 122/78. Patient has had no complaints of any cardiac type chest pain, palpitations, dyspnea / orthopnea / PND, dizziness, claudication, or dependent edema.      Hyperlipidemia is not controlled with diet & meds. Patient denies myalgias or other med SE's. Last Lipids were not at goal:  Lab Results  Component Value Date   CHOL 186 11/08/2019   HDL 38 (L) 11/08/2019   LDLCALC 118 (H) 11/08/2019   TRIG 186 (H) 11/08/2019   CHOLHDL 4.9 11/08/2019    Also, the patient has history of T2_NIDDM ( A1c 7.3% / 2012)  and has had no symptoms of reactive hypoglycemia, diabetic polys, paresthesias or visual blurring.  Last A1c was not at goal:  Lab Results  Component Value Date   HGBA1C 7.6 (H) 11/08/2019   Wt Readings from Last 3 Encounters:  05/16/20 195 lb (88.5 kg)  11/08/19 196 lb 6.4 oz (89.1 kg)  05/14/19 183 lb (83 kg)        Further, the patient also has history of Vitamin D Deficiency and supplements vitamin D without any suspected side-effects. Last vitamin D was at goal:  Lab Results  Component Value Date   VD25OH 86 11/08/2019    Current Outpatient Medications on File Prior to Visit  Medication Sig  . aspirin 81 MG tablet Take 81 mg by mouth 2 (two) times daily.   . Cholecalciferol (VITAMIN D PO) Take 10,000 Units daily by mouth.   . Cinnamon 500 MG capsule Take 500 mg daily by mouth.  Marland Kitchen glipiZIDE (GLUCOTROL) 5 MG tablet TAKE 1 TABLET 2 TO 3 X /DAY WITH MEALS FOR DIABETES  . glucose blood test strip Use as instructed  . hyoscyamine (LEVSIN SL) 0.125 MG SL tablet DISSOLVE ONE TABLET UNDER THE TONGUE 4 TIMES DAILY UP TO EVERY 4 HOURS AS NEEDED FOR  NAUSEA, BLOATING, CRAMPING, OR DIARRHEA  . metFORMIN (GLUCOPHAGE-XR) 500 MG 24 hr tablet Take 2 tablets 2 x /day with Meals for Diabetes  . RED YEAST RICE EXTRACT PO Take 1 capsule by mouth.   No current facility-administered medications on file prior to visit.    Allergies  Allergen Reactions  . Lipitor [Atorvastatin]     fatigue   PMHx:   Past Medical History:  Diagnosis Date  . Abnormal EKG   . DM (diabetes mellitus) ([place])   . Hyperlipidemia   . Hypogonadism male   . IBS (irritable bowel syndrome)   . Obesity    BMI 31  . Vitamin D deficiency     Immunization History  Administered Date(s) Administered  . Influenza,inj,Quad PF,6+ Mos 09/24/2017, 09/09/2018, 10/13/2019  . Influenza-Unspecified 09/02/2015, 09/24/2017, 09/09/2018  . Janssen (J&J) SARS-COV-2 Vaccination 03/07/2020  . PPD Test 05/24/2014, 07/18/2015, 08/08/2016, 09/11/2017, 10/13/2018, 11/08/2019  . Pneumococcal Polysaccharide-23 10/13/2018  . Pneumococcal-Unspecified 05/17/2013  . Tdap 05/17/2013    Past Surgical History:  Procedure Laterality Date  . APPENDECTOMY    . JOINT REPLACEMENT     pt. denies   . SHOULDER SURGERY Right     FHx:    Reviewed /  unchanged  SHx:    Reviewed / unchanged   Systems Review:  Constitutional: Denies fever, chills, wt changes, headaches, insomnia, fatigue, night sweats, change in appetite. Eyes: Denies redness, blurred vision, diplopia, discharge, itchy, watery eyes.  ENT: Denies discharge, congestion, post nasal drip, epistaxis, sore throat, earache, hearing loss, dental pain, tinnitus, vertigo, sinus pain, snoring.  CV: Denies chest pain, palpitations, irregular heartbeat, syncope, dyspnea, diaphoresis, orthopnea, PND, claudication or edema. Respiratory: denies cough, dyspnea, DOE, pleurisy, hoarseness, laryngitis, wheezing.  Gastrointestinal: Denies dysphagia, odynophagia, heartburn, reflux, water brash, abdominal pain or cramps, nausea, vomiting, bloating,  diarrhea, constipation, hematemesis, melena, hematochezia  or hemorrhoids. Genitourinary: Denies dysuria, frequency, urgency, nocturia, hesitancy, discharge, hematuria or flank pain. Musculoskeletal: Denies arthralgias, myalgias, stiffness, jt. swelling, pain, limping or strain/sprain.  Skin: Denies pruritus, rash, hives, warts, acne, eczema or change in skin lesion(s). Neuro: No weakness, tremor, incoordination, spasms, paresthesia or pain. Psychiatric: Denies confusion, memory loss or sensory loss. Endo: Denies change in weight, skin or hair change.  Heme/Lymph: No excessive bleeding, bruising or enlarged lymph nodes.  Physical Exam  BP 122/78   Pulse 80   Temp (!) 97 F (36.1 C)   Resp 16   Ht 5\' 11"  (9.211 m)   Wt 195 lb (88.5 kg)   BMI 27.20 kg/m   Appears  well nourished, well groomed  and in no distress.  Eyes: PERRLA, EOMs, conjunctiva no swelling or erythema. Sinuses: No frontal/maxillary tenderness ENT/Mouth: EAC's clear, TM's nl w/o erythema, bulging. Nares clear w/o erythema, swelling, exudates. Oropharynx clear without erythema or exudates. Oral hygiene is good. Tongue normal, non obstructing. Hearing intact.  Neck: Supple. Thyroid not palpable. Car 2+/2+ without bruits, nodes or JVD. Chest: Respirations nl with BS clear & equal w/o rales, rhonchi, wheezing or stridor.  Cor: Heart sounds normal w/ regular rate and rhythm without sig. murmurs, gallops, clicks or rubs. Peripheral pulses normal and equal  without edema.  Abdomen: Soft & bowel sounds normal. Non-tender w/o guarding, rebound, hernias, masses or organomegaly.  Lymphatics: Unremarkable.  Musculoskeletal: Full ROM all peripheral extremities, joint stability, 5/5 strength and normal gait.  Skin: Warm, dry without exposed rashes, lesions or ecchymosis apparent.  Neuro: Cranial nerves intact, reflexes equal bilaterally. Sensory-motor testing grossly intact. Tendon reflexes grossly intact.  Pysch: Alert & oriented  x 3.  Insight and judgement nl & appropriate. No ideations.  Assessment and Plan:  1. Labile hypertension  - Continue medication, monitor blood pressure at home.  - Continue DASH diet.  Reminder to go to the ER if any CP,  SOB, nausea, dizziness, severe HA, changes vision/speech.  - CBC with Differential/Platelet - COMPLETE METABOLIC PANEL WITH GFR - Magnesium - TSH  2. Hyperlipidemia associated with type 2 diabetes mellitus ([place])  - Continue diet/meds, exercise,& lifestyle modifications.  - Continue monitor periodic cholesterol/liver & renal functions   - Lipid panel - TSH  3. Uncontrolled type 2 diabetes mellitus with stage 2 chronic  kidney disease, without long-term current use of insulin (HCC)  - Continue diet, exercise  - Lifestyle modifications.  - Monitor appropriate labs.  - Hemoglobin A1c - Insulin, random  4. Vitamin D deficiency  - Continue supplementation.  - VITAMIN D 25 Hydroxy   5. Medication management  - CBC with Differential/Platelet - COMPLETE METABOLIC PANEL WITH GFR - Magnesium - Lipid panel - TSH - Hemoglobin A1c - Insulin, random - VITAMIN D 25 Hydroxy         Discussed  regular exercise, BP monitoring,  weight control to achieve/maintain BMI less than 25 and discussed med and SE's. Recommended limited due to  Poor insurance coverage.  I discussed the assessment and treatment plan with the patient.  Patient is given a Rx for Decadron taper for shoulder pains. The patient was provided an opportunity to ask questions and all were answered. The patient agreed with the plan and demonstrated an understanding of the instructions.  I provided over 30 minutes of exam, counseling, chart review and  complex critical decision making.   Kirtland Bouchard, MD

## 2020-05-16 ENCOUNTER — Other Ambulatory Visit: Payer: Self-pay

## 2020-05-16 ENCOUNTER — Ambulatory Visit (INDEPENDENT_AMBULATORY_CARE_PROVIDER_SITE_OTHER): Payer: Self-pay | Admitting: Internal Medicine

## 2020-05-16 VITALS — BP 122/78 | HR 80 | Temp 97.0°F | Resp 16 | Ht 71.0 in | Wt 195.0 lb

## 2020-05-16 DIAGNOSIS — E559 Vitamin D deficiency, unspecified: Secondary | ICD-10-CM

## 2020-05-16 DIAGNOSIS — E785 Hyperlipidemia, unspecified: Secondary | ICD-10-CM

## 2020-05-16 DIAGNOSIS — IMO0002 Reserved for concepts with insufficient information to code with codable children: Secondary | ICD-10-CM

## 2020-05-16 DIAGNOSIS — N182 Chronic kidney disease, stage 2 (mild): Secondary | ICD-10-CM

## 2020-05-16 DIAGNOSIS — R0989 Other specified symptoms and signs involving the circulatory and respiratory systems: Secondary | ICD-10-CM

## 2020-05-16 DIAGNOSIS — G8929 Other chronic pain: Secondary | ICD-10-CM

## 2020-05-16 DIAGNOSIS — Z79899 Other long term (current) drug therapy: Secondary | ICD-10-CM

## 2020-05-16 DIAGNOSIS — E1169 Type 2 diabetes mellitus with other specified complication: Secondary | ICD-10-CM

## 2020-05-16 DIAGNOSIS — M25512 Pain in left shoulder: Secondary | ICD-10-CM

## 2020-05-16 DIAGNOSIS — E1165 Type 2 diabetes mellitus with hyperglycemia: Secondary | ICD-10-CM

## 2020-05-16 DIAGNOSIS — E1122 Type 2 diabetes mellitus with diabetic chronic kidney disease: Secondary | ICD-10-CM

## 2020-05-16 MED ORDER — DEXAMETHASONE 4 MG PO TABS: ORAL_TABLET | ORAL | 0 refills | Status: DC

## 2020-05-16 MED ORDER — GABAPENTIN 400 MG PO CAPS: ORAL_CAPSULE | ORAL | 0 refills | Status: DC

## 2020-05-17 LAB — LIPID PANEL
Cholesterol: 147 mg/dL (ref <200)
HDL: 38 mg/dL — ABNORMAL LOW (ref >=40)
LDL Cholesterol (Calc): 89 mg/dL (calc)
Non-HDL Cholesterol (Calc): 109 mg/dL (calc) (ref <130)
Total CHOL/HDL Ratio: 3.9 (calc) (ref <5.0)
Triglycerides: 103 mg/dL (ref <150)

## 2020-05-17 LAB — COMPLETE METABOLIC PANEL WITH GFR
AG Ratio: 1.8 (calc) (ref 1.0–2.5)
ALT: 14 U/L (ref 9–46)
AST: 12 U/L (ref 10–35)
Albumin: 4.6 g/dL (ref 3.6–5.1)
Alkaline phosphatase (APISO): 60 U/L (ref 35–144)
BUN: 19 mg/dL (ref 7–25)
CO2: 29 mmol/L (ref 20–32)
Calcium: 9.7 mg/dL (ref 8.6–10.3)
Chloride: 103 mmol/L (ref 98–110)
Creat: 0.94 mg/dL (ref 0.70–1.25)
GFR, Est African American: 100 mL/min/{1.73_m2} (ref >=60)
GFR, Est Non African American: 86 mL/min/{1.73_m2} (ref >=60)
Globulin: 2.5 g/dL (calc) (ref 1.9–3.7)
Glucose, Bld: 231 mg/dL — ABNORMAL HIGH (ref 65–99)
Potassium: 4.5 mmol/L (ref 3.5–5.3)
Sodium: 138 mmol/L (ref 135–146)
Total Bilirubin: 0.6 mg/dL (ref 0.2–1.2)
Total Protein: 7.1 g/dL (ref 6.1–8.1)

## 2020-05-17 LAB — HEMOGLOBIN A1C
Hgb A1c MFr Bld: 8.1 % of total Hgb — ABNORMAL HIGH (ref <5.7)
Mean Plasma Glucose: 186 (calc)
eAG (mmol/L): 10.3 (calc)

## 2020-05-17 NOTE — Progress Notes (Signed)
=======================================================  -    Glucose = 231 mg% - too high (  ideal or goal is less than 130 mg%)  - and A1c - worse - gone up from 7.6% to now 8.1%  (Ideal or goal is less than 6.0%)    =======================================================  -  Total Chol =  147    and LDL Chol =   89   - Both Excellent   - Very low risk for Heart Attack  / Stroke =========================================================   Being diabetic has a  300% increased risk for heart attack,  stroke, cancer, and alzheimer- type vascular dementia.   It is very important that you work harder with diet by  avoiding all foods that are white except chicken,   fish & calliflower.  - Avoid white rice  (brown & wild rice is OK),   - Avoid white potatoes  (sweet potatoes in moderation is OK),   White bread or wheat bread or anything made out of   white flour like bagels, donuts, rolls, buns, biscuits, cakes,  - pastries, cookies, pizza crust, and pasta (made from  white flour & egg whites)   - vegetarian pasta or spinach or wheat pasta is OK.  - Multigrain breads like Arnold's, Pepperidge Farm or   multigrain sandwich thins or high fiber breads like   [place] bread or "Dave's Killer" breads that are  4 to 5 grams fiber per slice !  are best.    Diet, exercise and weight loss can reverse and cure  diabetes in the early stages.    - Diet, exercise and weight loss is very important in the   control and prevention of complications of diabetes which  affects every system in your body, ie.   -Brain - dementia/stroke,  - eyes - glaucoma/blindness,  - heart - heart attack/heart failure,  - kidneys - dialysis,  - stomach - gastric paralysis,  - intestines - malabsorption,  - nerves - severe painful neuritis,  - circulation - gangrene & loss of a leg(s)  - and finally  . . . . . . . . . . . . . . . . . .    - cancer and Alzheimers.

## 2020-07-01 ENCOUNTER — Other Ambulatory Visit: Payer: Self-pay | Admitting: Adult Health

## 2020-08-04 LAB — HM DIABETES EYE EXAM

## 2020-08-10 ENCOUNTER — Other Ambulatory Visit: Payer: Self-pay | Admitting: Internal Medicine

## 2020-08-22 ENCOUNTER — Encounter: Payer: Self-pay | Admitting: Internal Medicine

## 2020-08-23 ENCOUNTER — Ambulatory Visit: Payer: BLUE CROSS/BLUE SHIELD | Admitting: Physician Assistant

## 2020-10-15 NOTE — Progress Notes (Signed)
   History of Present Illness:      Patient is nice 64 y.o. MWM  with HTN (2013), HLD, T2_NIDDM (2012)  and Vitamin D Deficiency who has hx/o intermittent bilat shoulder pains limiting his ROM.  Right shoulder pain resolved, and he reports the Decadron "ran his sugar up" so he stopped it after a week.  Today he presents with worsening Left shoulder pain triggered by extremis of motion. Denies injury.  Medications  Current Outpatient Medications (Endocrine & Metabolic):  .  glipiZIDE  5 MG tablet, TAKE 1 TABLET 2 TO 3 X /DAY  .  metFORMIN-XR   500 MG, Take     2 tablets    2 x /day .  aspirin 81 MG tablet, Take  2   times daily.  Marland Kitchen  VITAMIN D  Take 10,000 Units daily  .  Cinnamon 500 MG capsule, Take daily  .  gabapentin 400 MG capsule, Take 1 capsule 3 x /day as needed for Pain .  hyoscyamine SL) 0.125 MG SL, DISSOLVE 1 tab under tongue  4 x /day as needed  .  RED YEAST RICE EXTRACT PO, Take 1 capsule by mouth.  Problem list He has Hyperlipidemia associated with type 2 diabetes mellitus ([place]); Essential hypertension; Testosterone Deficiency; Overweight (BMI 25.0-29.9); Vitamin D deficiency; Type 2 diabetes mellitus, controlled, with renal complications (HCC); CKD stage 2 due to type 2 diabetes mellitus ([place]); FH: hypertension; and Labile hypertension on their problem list.   Observations/Objective:   BP 122/74   P 66   T 97.5 F    Ht 5\' 11"     Wt 191 lb    SpO2 98%   BMI 26.64   Exam focused on Left shoulder with point tenderness in the anterior joint line. Noted acute pain with abduction or external/internal rotation. Sensory motor exam Normal from elbow to hand.   Assessment and Plan:  1. Chronic left shoulder pain, probable Impingement syndrome   - Meloxicam (MOBIC) 15 MG tablet; Take      1/2 to 1 tablet      Daily     with Food      for Pain & Inflammation  Dispense: 90 tablet; Refill: 0  - Gabapentin 600 mg  1/2 to 1 tab 3 x /day   - advised Tylenol 1,000 mg 3 to 4 x  /day   Follow Up Instructions:        I discussed the assessment and treatment plan with the patient. The patient was provided an opportunity to ask questions and all were answered. The patient agreed with the plan and demonstrated an understanding of the instructions.        The patient was advised to call back or seek an in-person evaluation if the symptoms worsen or if the condition fails to improve as anticipated.   Kirtland Bouchard, MD

## 2020-10-16 ENCOUNTER — Other Ambulatory Visit: Payer: Self-pay

## 2020-10-16 ENCOUNTER — Encounter: Payer: Self-pay | Admitting: Internal Medicine

## 2020-10-16 ENCOUNTER — Ambulatory Visit (INDEPENDENT_AMBULATORY_CARE_PROVIDER_SITE_OTHER): Payer: BLUE CROSS/BLUE SHIELD | Admitting: Internal Medicine

## 2020-10-16 VITALS — BP 122/74 | HR 66 | Temp 97.5°F | Ht 71.0 in | Wt 191.0 lb

## 2020-10-16 DIAGNOSIS — M25512 Pain in left shoulder: Secondary | ICD-10-CM

## 2020-10-16 DIAGNOSIS — G8929 Other chronic pain: Secondary | ICD-10-CM

## 2020-10-16 MED ORDER — MELOXICAM 15 MG PO TABS: ORAL_TABLET | ORAL | 0 refills | Status: DC

## 2020-10-16 MED ORDER — GABAPENTIN 600 MG PO TABS: ORAL_TABLET | ORAL | 2 refills | Status: DC

## 2020-10-16 NOTE — Patient Instructions (Signed)

## 2020-12-04 ENCOUNTER — Encounter: Payer: Self-pay | Admitting: Internal Medicine

## 2020-12-04 DIAGNOSIS — N401 Enlarged prostate with lower urinary tract symptoms: Secondary | ICD-10-CM | POA: Insufficient documentation

## 2020-12-04 DIAGNOSIS — N138 Other obstructive and reflux uropathy: Secondary | ICD-10-CM | POA: Insufficient documentation

## 2020-12-04 NOTE — Progress Notes (Unsigned)
Annual  Screening/Preventative Visit  & Comprehensive Evaluation & Examination      This very nice 65 y.o.  MWM presents for a Screening /Preventative Visit & comprehensive evaluation and management of multiple medical co-morbidities.  Patient has been followed for HTN, HLD, T2_NIDDM and Vitamin D Deficiency. Patient reports ongoing issues with Rt shoulder pains albeit less severe than previous. He does report that the Meloxicam seems to provide substantial benefit. He has not been taking his Gabapentin.       Labile HTN followed expectantly predates since 2013. Patient's BP has been controlled at home.  Today's BP is at goal - 122/80. Patient denies any cardiac symptoms as chest pain, palpitations, shortness of breath, dizziness or ankle swelling.      Patient's hyperlipidemia is controlled with diet and medications. Patient denies myalgias or other medication SE's. Last lipids were at goal:  Lab Results  Component Value Date   CHOL 147 05/16/2020   HDL 38 (L) 05/16/2020   LDLCALC 89 05/16/2020   TRIG 103 05/16/2020   CHOLHDL 3.9 05/16/2020        Patient has hx/o T2_NIDDM (A1c 7.3% /2012) w/CKD2  (GFR 86)  and patient's dietary compliance typically has ben poor.  Patient  denies reactive hypoglycemic symptoms, visual blurring, diabetic polys oprediabetes r paresthesias. Last A1c was not at goal:   Lab Results  Component Value Date   HGBA1C 8.1 (H) 05/16/2020                                       Patient has hx/o Testosterone Deficiency  (level "206" /2017) & had been  on parenteral  replacement,  but apparently has stopped replacement for perceived lack of benefit.      Finally, patient has history of Vitamin D Deficiency ("44"/2013)and last vitamin D was at goal:   Lab Results  Component Value Date   VD25OH 86 11/08/2019    Current Outpatient Medications on File Prior to Visit  Medication Sig  . aspirin 81 MG tablet Take  2 times daily.   Marland Kitchen VITAMIN D Take 10,000  Units daily   . Cinnamon 500 MG capsule Take daily   . gabapentin 600 MG tablet Take 1/2 to 1 tablet 3 x /day as needed  . glipiZIDE  5 MG tablet TAKE 1 TABLET 2 TO 3 X /DAY WITH MEALS   . Hyoscyamine 0.125 MG SL  ONE TAB UNDER  TONGUE 4 x DAILY   . meloxicam 15 MG tablet Take 1/2 to 1 tablet Daily   . metFORMIN-XR) 500 MG  Take 2 tablets2 x /day  . RED YEAST RICE EXTRACT  Take 1 capsule daily    Allergies  Allergen Reactions  . Lipitor [Atorvastatin]     fatigue    Past Medical History:  Diagnosis Date  . Abnormal EKG   . DM (diabetes mellitus) ([place])   . Hyperlipidemia   . Hypogonadism male   . IBS (irritable bowel syndrome)   . Obesity    BMI 31  . Vitamin D deficiency    Health Maintenance  Topic Date Due  . COLONOSCOPY (Pts 45-58yrs Insurance coverage will need to be confirmed)  11/22/2015  . COVID-19 Vaccine (2 - Booster for Janssen series) 05/02/2020  . INFLUENZA VACCINE  06/18/2020  . FOOT EXAM  11/06/2020  . URINE MICROALBUMIN  11/07/2020  . HEMOGLOBIN A1C  11/15/2020  .  OPHTHALMOLOGY EXAM  08/04/2021  . TETANUS/TDAP  05/18/2023  . Hepatitis C Screening  Completed  . HIV Screening  Completed   Immunization History  Administered Date(s) Administered  . Influenza,inj,Quad PF,6+ Mos 09/24/2017, 09/09/2018, 10/13/2019  . Influenza-Unspecified 09/02/2015, 09/24/2017, 09/09/2018  . Janssen (J&J) SARS-COV-2 Vaccination 03/07/2020  . PPD Test 05/24/2014, 07/18/2015, 08/08/2016, 09/11/2017, 10/13/2018, 11/08/2019  . Pneumococcal Polysaccharide-23 10/13/2018  . Pneumococcal-Unspecified 05/17/2013  . Tdap 05/17/2013   Last Colon -  11/21/2005 -    Recc 10 yr f/u - due 2017 & overdue - patient aware  Past Surgical History:  Procedure Laterality Date  . APPENDECTOMY    . JOINT REPLACEMENT     pt. denies   . SHOULDER SURGERY Right    Family History  Problem Relation Age of Onset  . Pancreatic cancer Mother   . Prostate cancer Father        Prostate  .  Diabetes Father   . Hypertension Father   . Drug abuse Brother   . Kidney Stones Brother   . Ovarian cancer Paternal Grandmother   . Lung cancer Paternal Grandfather        Cigar smoker  . Colon cancer Neg Hx   . Colon polyps Neg Hx   . Esophageal cancer Neg Hx   . Stomach cancer Neg Hx   . Rectal cancer Neg Hx    Social History   Socioeconomic History  . Marital status: Married    Spouse name: Mickel Baas  . Number of children: 1 daughter  Occupational History  . [employer].  Tobacco Use  . Smoking status: Never Smoker  . Smokeless tobacco: Never Used  Substance and Sexual Activity  . Alcohol use: No  . Drug use: No  . Sexual activity: Never    ROS Constitutional: Denies fever, chills, weight loss/gain, headaches, insomnia,  night sweats or change in appetite. Does c/o fatigue. Eyes: Denies redness, blurred vision, diplopia, discharge, itchy or watery eyes.  ENT: Denies discharge, congestion, post nasal drip, epistaxis, sore throat, earache, hearing loss, dental pain, Tinnitus, Vertigo, Sinus pain or snoring.  Cardio: Denies chest pain, palpitations, irregular heartbeat, syncope, dyspnea, diaphoresis, orthopnea, PND, claudication or edema Respiratory: denies cough, dyspnea, DOE, pleurisy, hoarseness, laryngitis or wheezing.  Gastrointestinal: Denies dysphagia, heartburn, reflux, water brash, pain, cramps, nausea, vomiting, bloating, diarrhea, constipation, hematemesis, melena, hematochezia, jaundice or hemorrhoids Genitourinary: Denies dysuria, frequency, urgency, nocturia, hesitancy, discharge, hematuria or flank pain Musculoskeletal: Other than his Rt shoulder pains, he denies arthralgia, myalgia, stiffness, Jt. Swelling, pain, limp or strain/sprain. Denies Falls. Skin: Denies puritis, rash, hives, warts, acne, eczema or change in skin lesion Neuro: No weakness, tremor, incoordination, spasms, paresthesia or pain Psychiatric: Denies confusion, memory loss or sensory  loss. Denies Depression. Endocrine: Denies change in weight, skin, hair change, nocturia, and paresthesia, diabetic polys, visual blurring or hyper / hypo glycemic episodes.  Heme/Lymph: No excessive bleeding, bruising or enlarged lymph nodes.  Physical Exam  BP 122/80   Pulse 76   Temp (!) 97.5 F (36.4 C)   Ht 5\' 11"  (1.803 m)   Wt 198 lb (89.8 kg)   SpO2 97%   BMI 27.62 kg/m   General Appearance: Well nourished and well groomed and in no apparent distress.  Eyes: PERRLA, EOMs, conjunctiva no swelling or erythema, normal fundi and vessels. Sinuses: No frontal/maxillary tenderness ENT/Mouth: EACs patent / TMs  nl. Nares clear without erythema, swelling, mucoid exudates. Oral hygiene is good. No erythema, swelling, or exudate. Tongue normal,  non-obstructing. Tonsils not swollen or erythematous. Hearing normal.  Neck: Supple, thyroid not palpable. No bruits, nodes or JVD. Respiratory: Respiratory effort normal.  BS equal and clear bilateral without rales, rhonci, wheezing or stridor. Cardio: Heart sounds are normal with regular rate and rhythm and no murmurs, rubs or gallops. Peripheral pulses are normal and equal bilaterally without edema. No aortic or femoral bruits. Chest: symmetric with normal excursions and percussion.  Abdomen: Soft, with Nl bowel sounds. Nontender, no guarding, rebound, hernias, masses, or organomegaly.  Lymphatics: Non tender without lymphadenopathy.  Musculoskeletal: Decreased abduction of the Rt shoulder to ~ 60 deg. Otherwise FROM thru-out and normal gait. Skin: Warm and dry without rashes, lesions, cyanosis, clubbing or  ecchymosis.  Neuro: Cranial nerves intact, reflexes equal bilaterally. Normal muscle tone, no cerebellar symptoms. Sensation intact.  Pysch: Alert and oriented X 3 with normal affect, insight and judgment appropriate.   Assessment and Plan  1. Annual Preventative/Screening Exam    2. Labile hypertension  - EKG 12-Lead - [place],  RETROPERITNL ABD,  LTD - Urinalysis, Routine w reflex microscopic - Microalbumin / creatinine urine ratio - CBC with Differential/Platelet - COMPLETE METABOLIC PANEL WITH GFR - Magnesium - TSH  3. Hyperlipidemia associated with type 2 diabetes mellitus ([place])  - EKG 12-Lead - [place], RETROPERITNL ABD,  LTD - Lipid panel  4. Uncontrolled type 2 diabetes mellitus with stage 2 chronic kidney  disease, without long-term current use of insulin (HCC)  - EKG 12-Lead - [place], RETROPERITNL ABD,  LTD - Urinalysis, Routine w reflex microscopic - Microalbumin / creatinine urine ratio - Hemoglobin A1c - Insulin, random  5. Vitamin D deficiency  - VITAMIN D 25 Hydroxy  6. Testosterone Deficiency  - Testosterone  7. BPH with obstruction/lower urinary tract symptoms  - PSA  8. Screening examination for pulmonary tuberculosis  - TB Skin Test  9. Screening for colorectal cancer   10. Prostate cancer screening  - PSA  11. Screening for ischemic heart disease  - EKG 12-Lead  12. FH: hypertension  - EKG 12-Lead - [place], RETROPERITNL ABD,  LTD  13. Screening for AAA (aortic abdominal aneurysm)  - EKG 12-Lead - [place], RETROPERITNL ABD,  LTD  14. Fatigue  - Iron,Total/Total Iron Binding Cap - Vitamin B12 - CBC with Differential/Platelet - TSH  15. Medication management  - Urinalysis, Routine w reflex microscopic - Microalbumin / creatinine urine ratio - CBC with Differential/Platelet - COMPLETE METABOLIC PANEL WITH GFR - Magnesium - Lipid panel - TSH - Hemoglobin A1c - Insulin, random - VITAMIN D 25 Hydroxy          Patient was counseled in prudent diet, weight control to achieve/maintain BMI less than 25, BP monitoring, regular exercise and medications as discussed.  Discussed med effects and SE's. Routine screening labs and tests as requested with regular follow-up as recommended. Over 40 minutes of exam, counseling, chart review and high complex critical decision  making was performed   Kirtland Bouchard, MD

## 2020-12-04 NOTE — Patient Instructions (Signed)

## 2020-12-05 ENCOUNTER — Other Ambulatory Visit: Payer: Self-pay

## 2020-12-05 ENCOUNTER — Encounter: Payer: Self-pay | Admitting: Internal Medicine

## 2020-12-05 ENCOUNTER — Ambulatory Visit (INDEPENDENT_AMBULATORY_CARE_PROVIDER_SITE_OTHER): Payer: 59 | Admitting: Internal Medicine

## 2020-12-05 VITALS — BP 122/80 | HR 76 | Temp 97.5°F | Ht 71.0 in | Wt 198.0 lb

## 2020-12-05 DIAGNOSIS — R350 Frequency of micturition: Secondary | ICD-10-CM | POA: Diagnosis not present

## 2020-12-05 DIAGNOSIS — Z136 Encounter for screening for cardiovascular disorders: Secondary | ICD-10-CM | POA: Diagnosis not present

## 2020-12-05 DIAGNOSIS — Z13220 Encounter for screening for lipoid disorders: Secondary | ICD-10-CM | POA: Diagnosis not present

## 2020-12-05 DIAGNOSIS — E291 Testicular hypofunction: Secondary | ICD-10-CM

## 2020-12-05 DIAGNOSIS — Z130 Encounter for screening for diseases of the blood and blood-forming organs and certain disorders involving the immune mechanism: Secondary | ICD-10-CM | POA: Diagnosis not present

## 2020-12-05 DIAGNOSIS — G8929 Other chronic pain: Secondary | ICD-10-CM

## 2020-12-05 DIAGNOSIS — N401 Enlarged prostate with lower urinary tract symptoms: Secondary | ICD-10-CM

## 2020-12-05 DIAGNOSIS — Z131 Encounter for screening for diabetes mellitus: Secondary | ICD-10-CM

## 2020-12-05 DIAGNOSIS — M25512 Pain in left shoulder: Secondary | ICD-10-CM

## 2020-12-05 DIAGNOSIS — R0989 Other specified symptoms and signs involving the circulatory and respiratory systems: Secondary | ICD-10-CM

## 2020-12-05 DIAGNOSIS — E559 Vitamin D deficiency, unspecified: Secondary | ICD-10-CM | POA: Diagnosis not present

## 2020-12-05 DIAGNOSIS — R5383 Other fatigue: Secondary | ICD-10-CM

## 2020-12-05 DIAGNOSIS — Z1212 Encounter for screening for malignant neoplasm of rectum: Secondary | ICD-10-CM

## 2020-12-05 DIAGNOSIS — Z0000 Encounter for general adult medical examination without abnormal findings: Secondary | ICD-10-CM

## 2020-12-05 DIAGNOSIS — Z79899 Other long term (current) drug therapy: Secondary | ICD-10-CM | POA: Diagnosis not present

## 2020-12-05 DIAGNOSIS — Z1389 Encounter for screening for other disorder: Secondary | ICD-10-CM | POA: Diagnosis not present

## 2020-12-05 DIAGNOSIS — Z0001 Encounter for general adult medical examination with abnormal findings: Secondary | ICD-10-CM

## 2020-12-05 DIAGNOSIS — Z111 Encounter for screening for respiratory tuberculosis: Secondary | ICD-10-CM | POA: Diagnosis not present

## 2020-12-05 DIAGNOSIS — Z8249 Family history of ischemic heart disease and other diseases of the circulatory system: Secondary | ICD-10-CM | POA: Diagnosis not present

## 2020-12-05 DIAGNOSIS — Z1211 Encounter for screening for malignant neoplasm of colon: Secondary | ICD-10-CM

## 2020-12-05 DIAGNOSIS — N138 Other obstructive and reflux uropathy: Secondary | ICD-10-CM

## 2020-12-05 DIAGNOSIS — E785 Hyperlipidemia, unspecified: Secondary | ICD-10-CM

## 2020-12-05 DIAGNOSIS — E1122 Type 2 diabetes mellitus with diabetic chronic kidney disease: Secondary | ICD-10-CM

## 2020-12-05 DIAGNOSIS — E1169 Type 2 diabetes mellitus with other specified complication: Secondary | ICD-10-CM

## 2020-12-05 DIAGNOSIS — Z1329 Encounter for screening for other suspected endocrine disorder: Secondary | ICD-10-CM

## 2020-12-05 DIAGNOSIS — Z125 Encounter for screening for malignant neoplasm of prostate: Secondary | ICD-10-CM

## 2020-12-05 DIAGNOSIS — IMO0002 Reserved for concepts with insufficient information to code with codable children: Secondary | ICD-10-CM

## 2020-12-05 MED ORDER — GABAPENTIN 600 MG PO TABS: ORAL_TABLET | ORAL | 0 refills | Status: DC

## 2020-12-06 LAB — CBC WITH DIFFERENTIAL/PLATELET
Absolute Monocytes: 439 cells/uL (ref 200–950)
Basophils Absolute: 31 cells/uL (ref 0–200)
Basophils Relative: 0.4 %
Eosinophils Absolute: 100 cells/uL (ref 15–500)
Eosinophils Relative: 1.3 %
HCT: 41.6 % (ref 38.5–50.0)
Hemoglobin: 14.3 g/dL (ref 13.2–17.1)
Lymphs Abs: 1486 cells/uL (ref 850–3900)
MCH: 32 pg (ref 27.0–33.0)
MCHC: 34.4 g/dL (ref 32.0–36.0)
MCV: 93.1 fL (ref 80.0–100.0)
MPV: 10.1 fL (ref 7.5–12.5)
Monocytes Relative: 5.7 %
Neutro Abs: 5644 cells/uL (ref 1500–7800)
Neutrophils Relative %: 73.3 %
Platelets: 189 10*3/uL (ref 140–400)
RBC: 4.47 10*6/uL (ref 4.20–5.80)
RDW: 11.7 % (ref 11.0–15.0)
Total Lymphocyte: 19.3 %
WBC: 7.7 10*3/uL (ref 3.8–10.8)

## 2020-12-06 LAB — COMPLETE METABOLIC PANEL WITH GFR
AG Ratio: 1.8 (calc) (ref 1.0–2.5)
ALT: 13 U/L (ref 9–46)
AST: 13 U/L (ref 10–35)
Albumin: 4.7 g/dL (ref 3.6–5.1)
Alkaline phosphatase (APISO): 75 U/L (ref 35–144)
BUN: 19 mg/dL (ref 7–25)
CO2: 30 mmol/L (ref 20–32)
Calcium: 10.6 mg/dL — ABNORMAL HIGH (ref 8.6–10.3)
Chloride: 101 mmol/L (ref 98–110)
Creat: 1.13 mg/dL (ref 0.70–1.25)
GFR, Est African American: 79 mL/min/{1.73_m2} (ref >=60)
GFR, Est Non African American: 68 mL/min/{1.73_m2} (ref >=60)
Globulin: 2.6 g/dL (calc) (ref 1.9–3.7)
Glucose, Bld: 245 mg/dL — ABNORMAL HIGH (ref 65–99)
Potassium: 4.2 mmol/L (ref 3.5–5.3)
Sodium: 140 mmol/L (ref 135–146)
Total Bilirubin: 0.6 mg/dL (ref 0.2–1.2)
Total Protein: 7.3 g/dL (ref 6.1–8.1)

## 2020-12-06 LAB — IRON, TOTAL/TOTAL IRON BINDING CAP
%SAT: 48 % (calc) (ref 20–48)
Iron: 166 ug/dL (ref 50–180)
TIBC: 349 mcg/dL (calc) (ref 250–425)

## 2020-12-06 LAB — HEMOGLOBIN A1C
Hgb A1c MFr Bld: 9.1 % of total Hgb — ABNORMAL HIGH (ref <5.7)
Mean Plasma Glucose: 214 mg/dL
eAG (mmol/L): 11.9 mmol/L

## 2020-12-06 LAB — TESTOSTERONE: Testosterone: 385 ng/dL (ref 250–827)

## 2020-12-06 LAB — LIPID PANEL
Cholesterol: 191 mg/dL (ref <200)
HDL: 43 mg/dL (ref >=40)
LDL Cholesterol (Calc): 118 mg/dL (calc) — ABNORMAL HIGH
Non-HDL Cholesterol (Calc): 148 mg/dL (calc) — ABNORMAL HIGH (ref <130)
Total CHOL/HDL Ratio: 4.4 (calc) (ref <5.0)
Triglycerides: 186 mg/dL — ABNORMAL HIGH (ref <150)

## 2020-12-06 LAB — MICROALBUMIN / CREATININE URINE RATIO
Creatinine, Urine: 95 mg/dL (ref 20–320)
Microalb Creat Ratio: 12 mcg/mg creat (ref <30)
Microalb, Ur: 1.1 mg/dL

## 2020-12-06 LAB — MAGNESIUM: Magnesium: 1.8 mg/dL (ref 1.5–2.5)

## 2020-12-06 LAB — VITAMIN D 25 HYDROXY (VIT D DEFICIENCY, FRACTURES): Vit D, 25-Hydroxy: 68 ng/mL (ref 30–100)

## 2020-12-06 LAB — URINALYSIS, ROUTINE W REFLEX MICROSCOPIC
Bilirubin Urine: NEGATIVE
Hgb urine dipstick: NEGATIVE
Leukocytes,Ua: NEGATIVE
Nitrite: NEGATIVE
Protein, ur: NEGATIVE
Specific Gravity, Urine: 1.026 (ref 1.001–1.03)
pH: 6.5 (ref 5.0–8.0)

## 2020-12-06 LAB — PSA: PSA: 0.59 ng/mL (ref <=4.0)

## 2020-12-06 LAB — INSULIN, RANDOM: Insulin: 26.5 u[IU]/mL — ABNORMAL HIGH

## 2020-12-06 LAB — TSH: TSH: 1.5 mIU/L (ref 0.40–4.50)

## 2020-12-06 LAB — VITAMIN B12: Vitamin B-12: 480 pg/mL (ref 200–1100)

## 2020-12-06 NOTE — Progress Notes (Signed)
========================================================== - Test results slightly outside the reference range are not unusual. If there is anything important, I will review this with you,  otherwise it is considered normal test values.  If you have further questions,  please do not hesitate to contact me at the office or via My Chart.  ========================================================== ==========================================================  -  Iron & Vitamin B12 - Both Normal & OK  ==========================================================  -  PSA - Very Low - Great  ==========================================================  -  Testosterone on the low side of Normal - Taking zinc 50 mg tab daily helps  raise Testosterone levels naturally ==========================================================  -  Glucose  = 245 mg% - Very high(ideal or Goal is less than 130 mg%)  - A1c - Worse - Up from 8.1% to Now 9.1% (Average glucose = 214 mg%)              And increased risk for diabetic complications  - Being diabetic has a  300% increased risk for heart attack,  stroke, cancer, and alzheimer- type vascular dementia.   - It is very important that you work harder with diet by  avoiding all foods that are white except chicken,   fish & calliflower.  - Avoid white rice  (brown & wild rice is OK),   - Avoid white potatoes  (sweet potatoes in moderation is OK),   White bread or wheat bread or anything made out of   white flour like bagels, donuts, rolls, buns, biscuits, cakes,  - pastries, cookies, pizza crust, and pasta (made from  white flour & egg whites)   - vegetarian pasta or spinach or wheat pasta is OK.  - Multigrain breads like Arnold's, Pepperidge Farm or   multigrain sandwich thins or high fiber breads like   [place] bread or "Dave's Killer" breads that are  4 to 5 grams fiber per slice !  are best.    Diet, exercise and weight loss can reverse and cure   diabetes in the early stages.    - Diet, exercise and weight loss is very important in the   control and prevention of complications of diabetes which  affects every system in your body, ie.   -Brain - dementia/stroke,  - eyes - glaucoma/blindness,  - heart - heart attack/heart failure,  - kidneys - dialysis,  - stomach - gastric paralysis,  - intestines - malabsorption,  - nerves - severe painful neuritis,  - circulation - gangrene & loss of a leg(s)  - and finally  . . . . . . . . . . . . . . . . . .    - cancer and Alzheimers. ==========================================================  -  Total Cho = 191 - elevated   (Ideal or Goal is less than 180 )   - and   - Bad  /Dangerous LDL Chol = 118 - Elevated  (Ideal or Goal is less than 70  !  )   - So . . . . . - Recommend low cholesterol diet   - Cholesterol only comes from animal sources  - ie. meat, dairy, egg yolks  - Eat all the vegetables you want.  - Avoid meat, especially red meat - Beef AND Pork .  - Avoid cheese & dairy - milk & ice cream.     - Cheese is the most concentrated form of trans-fats which  is the worst thing to clog up our arteries.   - Veggie cheese is OK which can be found in  the fresh  produce section at [facility] or [employer] or [employer] ==========================================================  -  Vitamin D = 68 - Great  ==========================================================  -  All Else - CBC - Kidneys - Electrolytes - Liver - Magnesium & Thyroid    - all  Normal / OK ===========================================================  ==========================================================

## 2021-01-07 ENCOUNTER — Other Ambulatory Visit: Payer: Self-pay | Admitting: Internal Medicine

## 2021-01-07 DIAGNOSIS — G8929 Other chronic pain: Secondary | ICD-10-CM

## 2021-01-11 ENCOUNTER — Other Ambulatory Visit: Payer: Self-pay | Admitting: Internal Medicine

## 2021-03-01 NOTE — Progress Notes (Signed)
FOLLOW UP  Assessment and Plan:   Hypertension Well controlled off of meds at this time Monitor blood pressure at home; patient to call if consistently greater than 130/80 Continue DASH diet.   Reminder to go to the ER if any CP, SOB, nausea, dizziness, severe HA, changes vision/speech, left arm numbness and tingling and jaw pain.  Cholesterol Currently above LDL goal; discussed and reorder simvastatin;  LDL goal <70 Continue low cholesterol diet and exercise.  Check lipid panel.   Diabetes with diabetic chronic kidney disease Continue medication: continue max tolerated metformin, strategies discussed; currently only taking glipizide 5 mg with dinner; slowly titrate up, increase to BID by 2.5 mg increments and monitor glucose closely, goal to check daily fasting with goal of <130 and PRN/prior to dinner  Continue diet and exercise.  Perform daily foot/skin check, notify office of any concerning changes.  Check A1C  Overweight Long discussion about weight loss, diet, and exercise Recommended diet heavy in fruits and veggies and low in animal meats, cheeses, and dairy products, appropriate calorie intake Discussed ideal weight for height Patient will work on continue with portion control and start more intentional exercise Will follow up in 3 months  Vitamin D Def At goal at last visit; continue supplementation to maintain goal of 70-100 Defer Vit D level  Continue diet and meds as discussed. Further disposition pending results of labs. Discussed med's effects and SE's.   Over 30 minutes of exam, counseling, chart review, and critical decision making was performed.   Future Appointments  Date Time Provider [place]  06/05/2021 11:30 AM [name], MD [facility] None  10/15/2021 11:00 AM Liane Comber, NP [facility] None  01/16/2022 10:00 AM [name], MD [facility] None     ----------------------------------------------------------------------------------------------------------------------  HPI 65 y.o. male  presents for 3 month follow up on hypertension, cholesterol, T2 diabetes, obesity and vitamin D deficiency.   BMI is Body mass index is 26.08 kg/m., he has been watching portions, but no other particular interventions, no current intentional exercise. He does walk his dogs a few times a week for 30 min at the time.  Wt Readings from Last 3 Encounters:  03/05/21 187 lb (84.8 kg)  12/05/20 198 lb (89.8 kg)  10/16/20 191 lb (86.6 kg)   His blood pressure has been controlled at home, today their BP is BP: 118/64  He does not workout. He denies chest pain, shortness of breath, dizziness.   He is on cholesterol medication (he was simvastatin 80 mg daily was at goal but admits has never taken consistsently, has also been at goal with RYRS but not currently taking either on regular basis, hx of myalgia with atorvastatin, ? Some brain fog with simvastatin but wasn't sure if was due to med) and denies myalgias. His cholesterol is not at goal. The cholesterol last visit was:   Lab Results  Component Value Date   CHOL 191 12/05/2020   HDL 43 12/05/2020   LDLCALC 118 (H) 12/05/2020   TRIG 186 (H) 12/05/2020   CHOLHDL 4.4 12/05/2020    He has not been working on diet and exercise for T2 diabetes, on ASA, not on ACEi/ARB and denies increased appetite, nausea, paresthesia of the feet, polydipsia, polyuria, visual disturbances, vomiting and weight loss. He has not been able to tolerate 2000 mg of metformin on a daily basis, typically takes 1000-1500 mg over meals. Prescribed glipizide 5 mg TID, only taking with dinner. He admits irregular with checking glucose, "usually 200+" but could  check fasting daily. He admits he has been not adhering to diet and can do much better. Long discussion about risks with poorly controlled glucose, appears motivated to work on this.   Last A1C in the office was:  Lab Results  Component Value Date   HGBA1C 9.1 (H) 12/05/2020    He has CKD II associated with T2DM, monitored at this office. Not currently on ACEi/ARB due to low BPs and stable values Lab Results  Component Value Date   GFRNONAA 68 12/05/2020   Patient is on Vitamin D supplement and at goal at recent check:    Lab Results  Component Value Date   VD25OH 68 12/05/2020       Current Medications:  Current Outpatient Medications on File Prior to Visit  Medication Sig  . aspirin 81 MG tablet Take 81 mg by mouth 2 (two) times daily.  . Cholecalciferol (VITAMIN D PO) Take 10,000 Units daily by mouth.   . Cinnamon 500 MG capsule Take 500 mg daily by mouth.  . gabapentin (NEURONTIN) 600 MG tablet Take      1/2 to 1 tablet      3 x /day       as needed for Pain  . glipiZIDE (GLUCOTROL) 5 MG tablet TAKE 1 TABLET BY MOUTH 2 TO 3 TIMES /DAY WITH MEALS FOR DIABETES  . glucose blood test strip Use as instructed  . hyoscyamine (LEVSIN SL) 0.125 MG SL tablet DISSOLVE ONE TABLET UNDER THE TONGUE 4 TIMES DAILY UP TO EVERY 4 HOURS AS NEEDED FOR NAUSEA, BLOATING, CRAMPING, OR DIARRHEA  . meloxicam (MOBIC) 15 MG tablet Take 1/2 to 1 tablet  Daily  with Food for Pain & Inflammation  . metFORMIN (GLUCOPHAGE-XR) 500 MG 24 hr tablet Take     2 tablets    2 x /day      with Meals      for Diabetes  . RED YEAST RICE EXTRACT PO Take 1 capsule by mouth.   No current facility-administered medications on file prior to visit.     Allergies:  Allergies  Allergen Reactions  . Lipitor [Atorvastatin]     fatigue     Medical History:  Past Medical History:  Diagnosis Date  . Abnormal EKG   . DM (diabetes mellitus) ([place])   . Hyperlipidemia   . Hypogonadism male   . IBS (irritable bowel syndrome)   . Obesity    BMI 31  . Vitamin D deficiency    Family history- Reviewed and unchanged Social history- Reviewed and unchanged   Review of Systems:  Review of Systems   Constitutional: Negative for malaise/fatigue and weight loss.  HENT: Negative for hearing loss and tinnitus.   Eyes: Negative for blurred vision and double vision.  Respiratory: Negative for cough, shortness of breath and wheezing.   Cardiovascular: Negative for chest pain, palpitations, orthopnea, claudication and leg swelling.  Gastrointestinal: Negative for abdominal pain, blood in stool, constipation, diarrhea, heartburn, melena, nausea and vomiting.  Genitourinary: Negative.   Musculoskeletal: Negative for joint pain and myalgias.  Skin: Negative for rash.  Neurological: Negative for dizziness, tingling, sensory change, weakness and headaches.  Endo/Heme/Allergies: Negative for polydipsia.  Psychiatric/Behavioral: Negative.   All other systems reviewed and are negative.   Physical Exam: BP 118/64   Pulse (!) 58   Temp (!) 97.5 F (36.4 C)   Ht 5\' 11"  (1.803 m)   Wt 187 lb (84.8 kg)   SpO2 97%   BMI 26.08 kg/m  Wt Readings from Last 3 Encounters:  03/05/21 187 lb (84.8 kg)  12/05/20 198 lb (89.8 kg)  10/16/20 191 lb (86.6 kg)   General Appearance: Well nourished, in no apparent distress. Eyes: PERRLA, EOMs, conjunctiva no swelling or erythema Sinuses: No Frontal/maxillary tenderness ENT/Mouth: Ext aud canals clear, TMs without erythema, bulging. No erythema, swelling, or exudate on post pharynx.  Tonsils not swollen or erythematous. Hearing normal.  Neck: Supple, thyroid normal.  Respiratory: Respiratory effort normal, BS equal bilaterally without rales, rhonchi, wheezing or stridor.  Cardio: RRR with no MRGs. Brisk peripheral pulses without edema.  Abdomen: Soft, + BS.  Non tender, no guarding, rebound, hernias, masses. Lymphatics: Non tender without lymphadenopathy.  Musculoskeletal: Full ROM, 5/5 strength, Normal gait Skin: Warm, dry without rashes, lesions, ecchymosis.  Neuro: Cranial nerves intact. No cerebellar symptoms.  Psych: Awake and oriented X 3, normal  affect, Insight and Judgment appropriate.    Izora Ribas, NP 8:50 AM [facility]

## 2021-03-05 ENCOUNTER — Encounter: Payer: Self-pay | Admitting: Adult Health

## 2021-03-05 ENCOUNTER — Other Ambulatory Visit: Payer: Self-pay

## 2021-03-05 ENCOUNTER — Ambulatory Visit (INDEPENDENT_AMBULATORY_CARE_PROVIDER_SITE_OTHER): Payer: 59 | Admitting: Adult Health

## 2021-03-05 VITALS — BP 118/64 | HR 58 | Temp 97.5°F | Ht 71.0 in | Wt 187.0 lb

## 2021-03-05 DIAGNOSIS — R0989 Other specified symptoms and signs involving the circulatory and respiratory systems: Secondary | ICD-10-CM | POA: Diagnosis not present

## 2021-03-05 DIAGNOSIS — E1122 Type 2 diabetes mellitus with diabetic chronic kidney disease: Secondary | ICD-10-CM | POA: Diagnosis not present

## 2021-03-05 DIAGNOSIS — E1169 Type 2 diabetes mellitus with other specified complication: Secondary | ICD-10-CM | POA: Diagnosis not present

## 2021-03-05 DIAGNOSIS — E785 Hyperlipidemia, unspecified: Secondary | ICD-10-CM

## 2021-03-05 DIAGNOSIS — E663 Overweight: Secondary | ICD-10-CM

## 2021-03-05 DIAGNOSIS — Z79899 Other long term (current) drug therapy: Secondary | ICD-10-CM | POA: Diagnosis not present

## 2021-03-05 DIAGNOSIS — Z1211 Encounter for screening for malignant neoplasm of colon: Secondary | ICD-10-CM

## 2021-03-05 DIAGNOSIS — IMO0002 Reserved for concepts with insufficient information to code with codable children: Secondary | ICD-10-CM

## 2021-03-05 DIAGNOSIS — N182 Chronic kidney disease, stage 2 (mild): Secondary | ICD-10-CM

## 2021-03-05 DIAGNOSIS — E559 Vitamin D deficiency, unspecified: Secondary | ICD-10-CM

## 2021-03-05 DIAGNOSIS — E1165 Type 2 diabetes mellitus with hyperglycemia: Secondary | ICD-10-CM

## 2021-03-05 MED ORDER — SIMVASTATIN 80 MG PO TABS: 80.0000 mg | ORAL_TABLET | Freq: Every day | ORAL | 1 refills | Status: DC

## 2021-03-05 NOTE — Patient Instructions (Addendum)
Goals    . Fasting Blood Glucose<130    . HEMOGLOBIN A1C < 7         Try wearing a callus patch/cushion around the splinter area -   At night, apply salicyclic acid patch (for warts) - start with low strength and increase up only if needed, goes up to 40% - then if you see the splinter once skin is thinned can try to encourage out or can return and we can try to remove for you if needed. Monitor closely for any sign of infection (redness, heat, discharge, worsening pain/tenderness).    Metformin - take with meals, better tolerated with more carbs  Glipizide - like insulin in a pill - take WITH meals, start slow and increase gradually while monitoring sugar trends. Increased exercise/activity will naturally bring down sugars so may need less on those days if start having low sugars during work days.   Goal is fasting <130  Eating small regular meals rather than large meals is also helpful for suguars  High fiber also helps

## 2021-03-06 ENCOUNTER — Other Ambulatory Visit: Payer: Self-pay | Admitting: Adult Health

## 2021-03-06 LAB — CBC WITH DIFFERENTIAL/PLATELET
Absolute Monocytes: 405 cells/uL (ref 200–950)
Basophils Absolute: 32 cells/uL (ref 0–200)
Basophils Relative: 0.6 %
Eosinophils Absolute: 81 cells/uL (ref 15–500)
Eosinophils Relative: 1.5 %
HCT: 43.6 % (ref 38.5–50.0)
Hemoglobin: 14.6 g/dL (ref 13.2–17.1)
Lymphs Abs: 1469 cells/uL (ref 850–3900)
MCH: 31.3 pg (ref 27.0–33.0)
MCHC: 33.5 g/dL (ref 32.0–36.0)
MCV: 93.6 fL (ref 80.0–100.0)
MPV: 9.8 fL (ref 7.5–12.5)
Monocytes Relative: 7.5 %
Neutro Abs: 3413 cells/uL (ref 1500–7800)
Neutrophils Relative %: 63.2 %
Platelets: 182 10*3/uL (ref 140–400)
RBC: 4.66 10*6/uL (ref 4.20–5.80)
RDW: 12.2 % (ref 11.0–15.0)
Total Lymphocyte: 27.2 %
WBC: 5.4 10*3/uL (ref 3.8–10.8)

## 2021-03-06 LAB — LIPID PANEL
Cholesterol: 195 mg/dL (ref <200)
HDL: 40 mg/dL (ref >=40)
LDL Cholesterol (Calc): 130 mg/dL (calc) — ABNORMAL HIGH
Non-HDL Cholesterol (Calc): 155 mg/dL (calc) — ABNORMAL HIGH (ref <130)
Total CHOL/HDL Ratio: 4.9 (calc) (ref <5.0)
Triglycerides: 132 mg/dL (ref <150)

## 2021-03-06 LAB — COMPLETE METABOLIC PANEL WITH GFR
AG Ratio: 1.9 (calc) (ref 1.0–2.5)
ALT: 12 U/L (ref 9–46)
AST: 13 U/L (ref 10–35)
Albumin: 4.5 g/dL (ref 3.6–5.1)
Alkaline phosphatase (APISO): 74 U/L (ref 35–144)
BUN: 16 mg/dL (ref 7–25)
CO2: 26 mmol/L (ref 20–32)
Calcium: 9.7 mg/dL (ref 8.6–10.3)
Chloride: 104 mmol/L (ref 98–110)
Creat: 0.76 mg/dL (ref 0.70–1.25)
GFR, Est African American: 112 mL/min/{1.73_m2} (ref >=60)
GFR, Est Non African American: 96 mL/min/{1.73_m2} (ref >=60)
Globulin: 2.4 g/dL (calc) (ref 1.9–3.7)
Glucose, Bld: 255 mg/dL — ABNORMAL HIGH (ref 65–99)
Potassium: 4.4 mmol/L (ref 3.5–5.3)
Sodium: 140 mmol/L (ref 135–146)
Total Bilirubin: 0.7 mg/dL (ref 0.2–1.2)
Total Protein: 6.9 g/dL (ref 6.1–8.1)

## 2021-03-06 LAB — TSH: TSH: 1.04 mIU/L (ref 0.40–4.50)

## 2021-03-06 LAB — MAGNESIUM: Magnesium: 2 mg/dL (ref 1.5–2.5)

## 2021-03-06 LAB — HEMOGLOBIN A1C
Hgb A1c MFr Bld: 9.9 % of total Hgb — ABNORMAL HIGH (ref <5.7)
Mean Plasma Glucose: 237 mg/dL
eAG (mmol/L): 13.2 mmol/L

## 2021-03-13 ENCOUNTER — Other Ambulatory Visit: Payer: Self-pay | Admitting: Internal Medicine

## 2021-03-13 DIAGNOSIS — G8929 Other chronic pain: Secondary | ICD-10-CM

## 2021-04-13 ENCOUNTER — Other Ambulatory Visit: Payer: Self-pay | Admitting: Internal Medicine

## 2021-04-13 DIAGNOSIS — M25512 Pain in left shoulder: Secondary | ICD-10-CM

## 2021-04-13 DIAGNOSIS — G8929 Other chronic pain: Secondary | ICD-10-CM

## 2021-04-17 ENCOUNTER — Other Ambulatory Visit: Payer: Self-pay | Admitting: Internal Medicine

## 2021-04-18 NOTE — Progress Notes (Signed)
Future Appointments  Date Time Provider [place]  04/19/2021  9:30 AM [name], MD [facility] None  06/05/2021 11:30 AM [name], MD [facility] None  10/15/2021 11:00 AM Liane Comber, NP [facility] None  01/16/2022 10:00 AM [name], MD [facility] None    History of Present Illness:       This very nice 65 y.o. MWM  with HTN, HLD, Pre-Diabetes and Vitamin D Deficiency, overweight  (BMI 27+) who presents with concerns weight loss over the last 6 months which he attributes to decreased appetite and decreased food intake.  He does admit to increased life stressors & increased anxiety affecting his appetite.       Patient is treated for HTN & BP has been controlled at home. Today's BP is at goal - 120/70. Patient has had no complaints of any cardiac type chest pain, palpitations, dyspnea / orthopnea / PND, dizziness, claudication, or dependent edema.       Hyperlipidemia is not controlled with diet & Simvastatin.  Patient denies myalgias or other med SE's. Last Lipids were not at goal with elevated LDL Chol:  Lab Results  Component Value Date   CHOL 195 03/05/2021   HDL 40 03/05/2021   LDLCALC 130 (H) 03/05/2021   TRIG 132 03/05/2021   CHOLHDL 4.9 03/05/2021     Also, the patient has history of T2_NIDDM (2012) CKD2  (GFR 86)  and has had no symptoms of reactive hypoglycemia, diabetic polys, paresthesias or visual blurring.  Last A1c was not at goal:  Lab Results  Component Value Date   HGBA1C 9.9 (H) 03/05/2021   Wt Readings from Last 3 Encounters:  04/19/21 171 lb 3.2 oz (77.7 kg)  03/05/21 187 lb (84.8 kg)  12/05/20 198 lb (89.8 kg)          Further, the patient also has history of Vitamin D Deficiency ("44"/2013) and supplements vitamin D without any suspected side-effects. Last vitamin D was   Lab Results  Component Value Date   VD25OH 68 12/05/2020     Current Outpatient Medications on File Prior to Visit  Medication Sig   . Cinnamon 500 MG capsule Take daily   . glipiZIDE  5 MG tablet TAKE 1 TABLET 2 TO 3 TIMES /DAY   . hyoscyamine SL 0.125 MG SL tablet DISSOLVE ONE TAB UNDER TONGUE 4 x DAILY   . metFORMIN-XR) 500 MG 24 hr tablet Take 2 tablets 2 x /day with Meals  . simvastatin (ZOCOR) 80 MG tablet Take 1 tablet  daily.  Marland Kitchen aspirin 81 MG tablet (Patient not taking: Reported on 04/19/2021)  . Cholecalciferol (VITAMIN D PO)  (Patient not taking: Reported on 04/19/2021)  . gabapentin (NEURONTIN) 600 MG tablet (Patient not taking: Reported on 04/19/2021)     Allergies  Allergen Reactions  . Lipitor [Atorvastatin]     fatigue     PMHx:   Past Medical History:  Diagnosis Date  . Abnormal EKG   . DM (diabetes mellitus) ([place])   . Hyperlipidemia   . Hypogonadism male   . IBS (irritable bowel syndrome)   . Obesity    BMI 31  . Vitamin D deficiency      Immunization History  Administered Date(s) Administered  . Influenza,inj,Quad PF 09/24/2017, 09/09/2018, 10/13/2019  . Influenza-U 09/02/2015, 09/24/2017, 09/09/2018  . Janssen (J&J) SARS-COV-2 Vacc 03/07/2020  . PPD Test 10/13/2018, 11/08/2019, 12/05/2020  . Pneumococcal -23 10/13/2018  . Pneumococcal-23 05/17/2013  . Tdap 05/17/2013  Past Surgical History:  Procedure Laterality Date  . APPENDECTOMY    . JOINT REPLACEMENT     pt. denies   . SHOULDER SURGERY Right     FHx:    Reviewed / unchanged  SHx:    Reviewed / unchanged   Systems Review:  Constitutional: Denies fever, chills, wt changes, headaches, insomnia, fatigue, night sweats, change in appetite. Eyes: Denies redness, blurred vision, diplopia, discharge, itchy, watery eyes.  ENT: Denies discharge, congestion, post nasal drip, epistaxis, sore throat, earache, hearing loss, dental pain, tinnitus, vertigo, sinus pain, snoring.  CV: Denies chest pain, palpitations, irregular heartbeat, syncope, dyspnea, diaphoresis, orthopnea, PND, claudication or edema. Respiratory: denies  cough, dyspnea, DOE, pleurisy, hoarseness, laryngitis, wheezing.  Gastrointestinal: Denies dysphagia, odynophagia, heartburn, reflux, water brash, abdominal pain or cramps, nausea, vomiting, bloating, diarrhea, constipation, hematemesis, melena, hematochezia  or hemorrhoids. Genitourinary: Denies dysuria, frequency, urgency, nocturia, hesitancy, discharge, hematuria or flank pain. Musculoskeletal: Denies arthralgias, myalgias, stiffness, jt. swelling, pain, limping or strain/sprain.  Skin: Denies pruritus, rash, hives, warts, acne, eczema or change in skin lesion(s). Neuro: No weakness, tremor, incoordination, spasms, paresthesia or pain. Psychiatric: Denies confusion, memory loss or sensory loss. Endo: Denies change in weight, skin or hair change.  Heme/Lymph: No excessive bleeding, bruising or enlarged lymph nodes.  Physical Exam  BP 120/70 (BP Location: Right Arm, Patient Position: Sitting, Cuff Size: Normal)   Pulse 96   Temp (!) 97.5 F (36.4 C)   Resp 17   Ht 5\' 11"  (1.803 m)   Wt 171 lb 3.2 oz (77.7 kg)   SpO2 99%   BMI 23.88 kg/m   Appears  well nourished, well groomed  and in no distress.  Eyes: PERRLA, EOMs, conjunctiva no swelling or erythema. Sinuses: No frontal/maxillary tenderness ENT/Mouth: EAC's clear, TM's nl w/o erythema, bulging. Nares clear w/o erythema, swelling, exudates. Oropharynx clear without erythema or exudates. Oral hygiene is good. Tongue normal, non obstructing. Hearing intact.  Neck: Supple. Thyroid not palpable. Car 2+/2+ without bruits, nodes or JVD. Chest: Respirations nl with BS clear & equal w/o rales, rhonchi, wheezing or stridor.  Cor: Heart sounds normal w/ regular rate and rhythm without sig. murmurs, gallops, clicks or rubs. Peripheral pulses normal and equal  without edema.  Abdomen: Soft & bowel sounds normal. Non-tender w/o guarding, rebound, hernias, masses or organomegaly.  Lymphatics: Unremarkable.  Musculoskeletal: Full ROM all  peripheral extremities, joint stability, 5/5 strength and normal gait.  Skin: Warm, dry without exposed rashes, lesions or ecchymosis apparent.  Neuro: Cranial nerves intact, reflexes equal bilaterally. Sensory-motor testing grossly intact. Tendon reflexes grossly intact.  Pysch: Alert & oriented x 3.  Insight and judgement nl & appropriate. No ideations.  Assessment and Plan:  - Continue diet/meds, exercise,& lifestyle modifications.  - Continue monitor periodic cholesterol/liver & renal functions   1. Labile hypertension  - Continue medication, monitor blood pressure at home.  - Continue DASH diet.  Reminder to go to the ER if any CP,  SOB, nausea, dizziness, severe HA, changes vision/speech.   2. Uncontrolled type 2 diabetes mellitus with stage 2 chronic kidney disease, without long-term current use of insulin ([place])   3. Weight loss   4. Current moderate episode of major depressive disorder without prior episode ([place])  - LEXAPRO 20 MG ; Take 1 tablet  Daily  for Mood & Chronic Anxiety  Disp: 90 tablet; Rf: 1  - traZODone 150 MG tablet; Take  1/2 to 1 tablet  1 hour  before Bedtime for  Sleep  Dispense: 90 tablet; Refi - Continue diet, exercise   - Lifestyle modifications.  - Monitor appropriate labs.ll: 1    - Continue supplementation.        Discussed  regular exercise, BP monitoring, weight control to achieve/maintain BMI less than 25 and discussed med and SE's. Recommended labs to assess and monitor clinical status with further disposition pending results of labs.  I discussed the assessment and treatment plan with the patient. The patient was provided an opportunity to ask questions and all were answered. The patient agreed with the plan and demonstrated an understanding of the instructions.  I provided over 30 minutes of exam, counseling, chart review and  complex critical decision making.        The patient was advised to call back or seek an in-person evaluation if the  symptoms worsen or if the condition fails to improve as anticipated.   Kirtland Bouchard, MD .

## 2021-04-19 ENCOUNTER — Other Ambulatory Visit: Payer: Self-pay

## 2021-04-19 ENCOUNTER — Encounter: Payer: Self-pay | Admitting: Internal Medicine

## 2021-04-19 ENCOUNTER — Ambulatory Visit (INDEPENDENT_AMBULATORY_CARE_PROVIDER_SITE_OTHER): Payer: 59 | Admitting: Internal Medicine

## 2021-04-19 VITALS — BP 120/70 | HR 96 | Temp 97.5°F | Resp 17 | Ht 71.0 in | Wt 171.2 lb

## 2021-04-19 DIAGNOSIS — R0989 Other specified symptoms and signs involving the circulatory and respiratory systems: Secondary | ICD-10-CM

## 2021-04-19 DIAGNOSIS — IMO0002 Reserved for concepts with insufficient information to code with codable children: Secondary | ICD-10-CM

## 2021-04-19 DIAGNOSIS — E1122 Type 2 diabetes mellitus with diabetic chronic kidney disease: Secondary | ICD-10-CM

## 2021-04-19 DIAGNOSIS — E1165 Type 2 diabetes mellitus with hyperglycemia: Secondary | ICD-10-CM

## 2021-04-19 DIAGNOSIS — R634 Abnormal weight loss: Secondary | ICD-10-CM

## 2021-04-19 DIAGNOSIS — F321 Major depressive disorder, single episode, moderate: Secondary | ICD-10-CM | POA: Diagnosis not present

## 2021-04-19 DIAGNOSIS — N182 Chronic kidney disease, stage 2 (mild): Secondary | ICD-10-CM

## 2021-04-19 MED ORDER — ESCITALOPRAM OXALATE 20 MG PO TABS: ORAL_TABLET | ORAL | 1 refills | Status: DC

## 2021-04-19 MED ORDER — TRAZODONE HCL 150 MG PO TABS: ORAL_TABLET | ORAL | 1 refills | Status: DC

## 2021-04-22 NOTE — Patient Instructions (Signed)

## 2021-04-29 ENCOUNTER — Emergency Department (INDEPENDENT_AMBULATORY_CARE_PROVIDER_SITE_OTHER)
Admission: EM | Admit: 2021-04-29 | Discharge: 2021-04-29 | Disposition: A | Discharge status: Home / Self Care | Payer: 59 | Source: Home / Self Care

## 2021-04-29 ENCOUNTER — Encounter: Payer: Self-pay | Admitting: Family Medicine

## 2021-04-29 ENCOUNTER — Other Ambulatory Visit: Payer: Self-pay

## 2021-04-29 DIAGNOSIS — L03031 Cellulitis of right toe: Secondary | ICD-10-CM

## 2021-04-29 DIAGNOSIS — R739 Hyperglycemia, unspecified: Secondary | ICD-10-CM

## 2021-04-29 LAB — POCT FASTING CBG KUC MANUAL ENTRY: POCT Glucose (KUC): 235 mg/dL — AB (ref 70–99)

## 2021-04-29 MED ORDER — AMOXICILLIN-POT CLAVULANATE 875-125 MG PO TABS: 1.0000 | ORAL_TABLET | Freq: Two times a day (BID) | ORAL | 0 refills | Status: DC

## 2021-04-29 NOTE — Discharge Instructions (Addendum)
Advised patient to take medication as directed with food to completion.  Advised patient to increase daily water intake while taking this medication advised/encourage patient can RICE right foot/fourth toe for 20 minutes 2-3 times daily to improve redness and swelling.  Encourage patient if symptoms worsen and or unresolved please follow-up with PCP or here for further evaluation.  Advised patient to follow-up with PCP with uncontrolled T2DM.

## 2021-04-29 NOTE — ED Provider Notes (Signed)
[facility]    CSN: [id number] Arrival date & time: 04/29/21  1003      History   Chief Complaint Chief Complaint  Patient presents with   Toe Injury    R 4th toe     HPI Zachary Reid is a 65 y.o. male.   HPI 65 year old male presents with right fourth toe redness and swelling.  Reports mowing lawn yesterday morning and shoes no socks red and swollen fourth toe on right foot..  Reports fasting blood sugar runs 180 to low 200s.  Patient's most recent HA1C of 03/05/2021 was 9.9-uncontrolled T2DM.  Past Medical History:  Diagnosis Date   Abnormal EKG    DM (diabetes mellitus) ([place])    Hyperlipidemia    Hypogonadism male    IBS (irritable bowel syndrome)    Obesity    BMI 31   Vitamin D deficiency     Patient Active Problem List   Diagnosis Date Noted   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   Labile hypertension 10/13/2018   FH: hypertension 03/19/2018   CKD stage 2 due to type 2 diabetes mellitus ([place]) 12/17/2017   Uncontrolled type 2 diabetes mellitus with stage 2 chronic kidney disease, without long-term current use of insulin ([place]) 02/02/2016   Vitamin D deficiency 05/24/2014   Testosterone Deficiency    Overweight (BMI 25.0-29.9)    Hyperlipidemia associated with type 2 diabetes mellitus ([place]) 05/29/2011    Past Surgical History:  Procedure Laterality Date   APPENDECTOMY     JOINT REPLACEMENT     pt. denies    SHOULDER SURGERY Right        Home Medications    Prior to Admission medications   Medication Sig Start Date End Date Taking? Authorizing Provider  amoxicillin-clavulanate (AUGMENTIN) 875-125 MG tablet Take 1 tablet by mouth every 12 (twelve) hours. 04/29/21  Yes Eliezer Lofts, FNP  escitalopram (LEXAPRO) 20 MG tablet Take 1 tablet  Daily  for Mood & Chronic Anxiety 04/19/21  Yes [name], MD  simvastatin (ZOCOR) 80 MG tablet Take 1 tablet (80 mg total) by mouth daily. 03/05/21  Yes Liane Comber, NP   traZODone (DESYREL) 150 MG tablet Take  1/2 to 1 tablet  1 hour  before Bedtime for Sleep 04/19/21  Yes [name], MD  Cinnamon 500 MG capsule Take 500 mg daily by mouth. Patient not taking: Reported on 04/29/2021    [provider]  glipiZIDE (GLUCOTROL) 5 MG tablet TAKE 1 TABLET BY MOUTH 2 TO 3 TIMES /DAY WITH MEALS FOR DIABETES Patient taking differently: 10 mg. TAKE 1 TABLET BY MOUTH 2 TO 3 TIMES /DAY WITH MEALS FOR DIABETES 01/11/21   Liane Comber, NP  glucose blood test strip Use as instructed 12/19/17   Liane Comber, NP  hyoscyamine (LEVSIN SL) 0.125 MG SL tablet DISSOLVE ONE TABLET UNDER THE TONGUE 4 TIMES DAILY UP TO EVERY 4 HOURS AS NEEDED FOR NAUSEA, BLOATING, CRAMPING, OR DIARRHEA 12/18/17   Liane Comber, NP  metFORMIN (GLUCOPHAGE-XR) 500 MG 24 hr tablet Take     2 tablets    2 x /day      with Meals      for Diabetes Patient not taking: Reported on 04/29/2021 08/10/20   [name], MD    Family History Family History  Problem Relation Age of Onset   Pancreatic cancer Mother    Prostate cancer Father        Prostate   Diabetes Father  Hypertension Father    Drug abuse Brother    Kidney Stones Brother    Ovarian cancer Paternal Grandmother    Lung cancer Paternal Grandfather        Cigar smoker   Colon cancer Neg Hx    Colon polyps Neg Hx    Esophageal cancer Neg Hx    Stomach cancer Neg Hx    Rectal cancer Neg Hx     Social History Social History   Tobacco Use   Smoking status: Never   Smokeless tobacco: Never  Substance Use Topics   Alcohol use: No   Drug use: No     Allergies   Lipitor [atorvastatin]   Review of Systems Review of Systems  Constitutional: Negative.   HENT: Negative.    Eyes: Negative.   Respiratory: Negative.    Cardiovascular: Negative.   Gastrointestinal: Negative.   Endocrine: Negative.   Genitourinary: Negative.   Musculoskeletal:        Right fourth toe pain  Skin:        Right fourth toe redness  and swelling  Neurological: Negative.     Physical Exam Triage Vital Signs ED Triage Vitals  Enc Vitals Group     BP 04/29/21 1037 124/75     Pulse Rate 04/29/21 1037 75     Resp 04/29/21 1037 15     Temp 04/29/21 1037 98.1 F (36.7 C)     Temp src --      SpO2 04/29/21 1037 97 %     Weight --      Height --      Head Circumference --      Peak Flow --      Pain Score 04/29/21 1039 0     Pain Loc --      Pain Edu? --      Excl. in [place]? --    No data found.  Updated Vital Signs BP 124/75 (BP Location: Right Arm)   Pulse 75   Temp 98.1 F (36.7 C)   Resp 15   SpO2 97%     Physical Exam Constitutional:      General: He is not in acute distress.    Appearance: Normal appearance. He is normal weight. He is not ill-appearing.  HENT:     Head: Normocephalic and atraumatic.     Mouth/Throat:     Mouth: Mucous membranes are moist.     Pharynx: Oropharynx is clear.  Eyes:     Extraocular Movements: Extraocular movements intact.     Conjunctiva/sclera: Conjunctivae normal.     Pupils: Pupils are equal, round, and reactive to light.  Cardiovascular:     Rate and Rhythm: Normal rate and regular rhythm.     Pulses: Normal pulses.     Heart sounds: Normal heart sounds.  Pulmonary:     Effort: Pulmonary effort is normal.     Breath sounds: Normal breath sounds.     Comments: No adventitious breath sounds noted Musculoskeletal:        General: Normal range of motion.     Cervical back: Normal range of motion and neck supple. No tenderness.  Lymphadenopathy:     Cervical: No cervical adenopathy.  Skin:    General: Skin is warm and dry.     Comments: Right foot fourth toe (dorsal aspect): Erythematous, with mild to moderate soft tissue swelling noted; nonindurated, nonfluctuant, no lymphatic streaking, right PT/DP +2 and bounding, neurosensory intact, neurovascular intact  Neurological:  General: No focal deficit present.     Mental Status: He is alert and oriented  to person, place, and time.  Psychiatric:        Mood and Affect: Mood normal.        Behavior: Behavior normal.     UC Treatments / Results  Labs (all labs ordered are listed, but only abnormal results are displayed) Labs Reviewed  POCT FASTING CBG [facility] MANUAL ENTRY - Abnormal; Notable for the following components:      Result Value   POCT Glucose ([facility]) 235 (*)    All other components within normal limits    EKG   Radiology No results found.  Procedures Procedures (including critical care time)  Medications Ordered in UC Medications - No data to display  Initial Impression / Assessment and Plan / UC Course  I have reviewed the triage vital signs and the nursing notes.  Pertinent labs & imaging results that were available during my care of the patient were reviewed by me and considered in my medical decision making (see chart for details).     MDM: 1.  Cellulitis of toe of right foot, 2.  Hyperglycemia.  Patient discharged home hemodynamically stable. Final Clinical Impressions(s) / UC Diagnoses   Final diagnoses:  Cellulitis of toe of right foot  Hyperglycemia     Discharge Instructions      Advised patient to take medication as directed with food to completion.  Advised patient to increase daily water intake while taking this medication advised/encourage patient can RICE right foot/fourth toe for 20 minutes 2-3 times daily to improve redness and swelling.  Encourage patient if symptoms worsen and or unresolved please follow-up with PCP or here for further evaluation.  Advised patient to follow-up with PCP with uncontrolled T2DM.     ED Prescriptions     Medication Sig Dispense Auth. Provider   amoxicillin-clavulanate (AUGMENTIN) 875-125 MG tablet Take 1 tablet by mouth every 12 (twelve) hours. 14 tablet Eliezer Lofts, FNP      PDMP not reviewed this encounter.   Eliezer Lofts, [place] 04/29/21 1111

## 2021-04-29 NOTE — ED Triage Notes (Signed)
Pt was mowing lawn yesterday morning in shoes - no socks Red & swollen 4th toe on right foot Per pt his blood sugars run 180 - low 200's  Has not checked BS this morning  Diabetic x 8 years J & J vaccine 4/21 - no booster

## 2021-05-03 ENCOUNTER — Telehealth: Payer: Self-pay

## 2021-05-03 NOTE — Telephone Encounter (Signed)
Pt was called X3 times to discuss his message that he left . Pt didn't answer the phone and a message was left for him to call the office back.

## 2021-05-04 ENCOUNTER — Telehealth: Payer: Self-pay

## 2021-05-09 NOTE — Progress Notes (Signed)
Future Appointments  Date Time Provider [place]  05/10/2021  9:30 AM [name], MD [facility] None  06/05/2021 11:30 AM [name], MD [facility] None  10/15/2021 11:00 AM Liane Comber, NP [facility] None  01/16/2022 10:00 AM [name], MD [facility] None   History of Present Illness:     This very nice 65 yo MWM with \\T2_DM   on oral agents with poor diet compliance and  A1c rising from 9.1% in Jan to 9.9% in Apr ( He had stopped his Metformin ).  On June 6/12 he went to ER & was dx'd /treated for  cellulitis  of  rt  4th toe with Augmentin.     Patient returns for f/u after recently starting Lexapro for depression  and report some improvement , but is c/o sx's of ED.   He relates that he has also stopped his Metformin due to Diarrhea.                                                                                                           Medications    glipiZIDE 5 MG tablet, TAKE 1 TABLET   2 TO 3 TIMES /DAY     simvastatin 80 MG tablet, Take 1 tablet  daily.    amoxicillin-clavulanate (AUGMENTIN) 875-125 MG tablet, Take 1 tablet  every 12 (twelve) hours.    escitalopram (LEXAPRO) 20 MG  Take 1 tablet  Daily  for Mood & Chronic Anxiety    hyoscyamine SL)0.125 MG SL tablet, DISSOLVE ONE TABLET UNDER THE TONGUE 4 TIMES DAILY UP TO EVERY 4 HOURS AS NEEDED     traZODone 150 MG tablet, Take  1/2 to 1 tablet  1 hour  before Bedtime for Sleep  Problem list He has Hyperlipidemia associated with type 2 diabetes mellitus ([place]); Testosterone Deficiency; Overweight (BMI 25.0-29.9); Vitamin D deficiency; Uncontrolled type 2 diabetes mellitus with stage 2 chronic kidney disease, without long-term current use of insulin ([place]); CKD stage 2 due to type 2 diabetes mellitus ([place]); FH: hypertension; Labile hypertension; and BPH with obstruction/lower urinary tract symptoms on their problem list.   Observations/Objective:  BP 114/74   Pulse 66   Temp 97.9 F (36.6 C)    Resp 16   Ht 5\' 11"  (1.803 m)   Wt 174 lb (78.9 kg)   SpO2 98%   BMI 24.27 kg/m   HEENT - WNL. Neck - supple.  Chest - Clear equal BS. Cor - Nl HS. RRR w/o sig MGR. PP 1(+). No edema. MS- FROM w/o deformities.  Gait Nl. Neuro -  Nl w/o focal abnormalities.   Assessment and Plan:   1. Poorly controlled type 2 diabetes mellitus with renal complication (HCC)  - [id number],6.81 or 0.5MG /DOS, (OZEMPIC, 0.25 OR 0.5 MG/DOSE,) 2 MG/1.5ML SOPN; Inject 0.5 mg into the skin once a week.  Dispense: 3 mL; Refill: 1  2. Current mild episode of major depressive disorder without prior episode (HCC)  - buPROPion XL 150 MG; Take 1 tablet every Morning for Mood, Focus & Concentration  Disp:  90 tab; Rf 3   Follow Up Instructions:       I discussed the assessment and treatment plan with the patient. The patient was provided an opportunity to ask questions and all were answered. The patient agreed with the plan and demonstrated an understanding of the instructions.       The patient was advised to call back or seek an in-person evaluation if the symptoms worsen or if the condition fails to improve as anticipated.   Kirtland Bouchard, MD

## 2021-05-10 ENCOUNTER — Other Ambulatory Visit: Payer: Self-pay | Admitting: Internal Medicine

## 2021-05-10 ENCOUNTER — Ambulatory Visit (INDEPENDENT_AMBULATORY_CARE_PROVIDER_SITE_OTHER): Payer: 59 | Admitting: Internal Medicine

## 2021-05-10 ENCOUNTER — Encounter: Payer: Self-pay | Admitting: Internal Medicine

## 2021-05-10 ENCOUNTER — Other Ambulatory Visit: Payer: Self-pay

## 2021-05-10 VITALS — BP 114/74 | HR 66 | Temp 97.9°F | Resp 16 | Ht 71.0 in | Wt 174.0 lb

## 2021-05-10 DIAGNOSIS — F320 Major depressive disorder, single episode, mild: Secondary | ICD-10-CM

## 2021-05-10 DIAGNOSIS — E1129 Type 2 diabetes mellitus with other diabetic kidney complication: Secondary | ICD-10-CM | POA: Diagnosis not present

## 2021-05-10 DIAGNOSIS — E1165 Type 2 diabetes mellitus with hyperglycemia: Secondary | ICD-10-CM | POA: Diagnosis not present

## 2021-05-10 MED ORDER — OZEMPIC (0.25 OR 0.5 MG/DOSE) 2 MG/1.5ML ~~LOC~~ SOPN: PEN_INJECTOR | SUBCUTANEOUS | 0 refills | Status: DC

## 2021-05-10 MED ORDER — BUPROPION HCL ER (XL) 150 MG PO TB24: ORAL_TABLET | ORAL | 3 refills | Status: DC

## 2021-05-12 MED ORDER — OZEMPIC (0.25 OR 0.5 MG/DOSE) 2 MG/1.5ML ~~LOC~~ SOPN: 0.5000 mg | PEN_INJECTOR | SUBCUTANEOUS | 1 refills | Status: DC

## 2021-05-13 ENCOUNTER — Other Ambulatory Visit: Payer: Self-pay | Admitting: Internal Medicine

## 2021-05-13 DIAGNOSIS — E1129 Type 2 diabetes mellitus with other diabetic kidney complication: Secondary | ICD-10-CM

## 2021-05-13 DIAGNOSIS — E1165 Type 2 diabetes mellitus with hyperglycemia: Secondary | ICD-10-CM

## 2021-05-17 NOTE — Telephone Encounter (Signed)
Pt was called to discuss his  lab result.

## 2021-06-04 ENCOUNTER — Encounter: Payer: Self-pay | Admitting: Internal Medicine

## 2021-06-04 NOTE — Progress Notes (Signed)
Future Appointments  Date Time Provider [place]  06/05/2021 11:30 AM [name], MD [facility] None  10/15/2021  - Welcome Medicare 11:00 AM Liane Comber, NP [facility] None  01/16/2022  - CPE  10:00 AM [name], MD [facility] None    History of Present Illness:       This very nice 65 y.o. MWM presents for 6  month follow up with HTN, HLD, Pre-Diabetes and Vitamin D Deficiency.        Patient is  followed expectantly for labile HTN since 2013 & BP has been controlled. Today's BP is at goal - 116/76. Patient has had no complaints of any cardiac type chest pain, palpitations, dyspnea / orthopnea / PND, dizziness, claudication, or dependent edema.       Hyperlipidemia is controlled with diet & meds. Patient denies myalgias or other med SE's. Last Lipids were  Lab Results  Component Value Date   CHOL 195 03/05/2021   HDL 40 03/05/2021   LDLCALC 130 (H) 03/05/2021   TRIG 132 03/05/2021   CHOLHDL 4.9 03/05/2021     Also, the patient has history of T2_NIDDM (A1c 7.3% /2012) w/CKD2  (GFR 86) and has had no symptoms of reactive hypoglycemia, diabetic polys, paresthesias or visual blurring.  Patient was seen recently about 3 weeks ago for poorly controlled Diabetes having self d/c'd his Metformin due to Diarrhea.  He was started on Ozempic at that visit.  Last A1c was not at goal:  Lab Results  Component Value Date   HGBA1C 9.9 (H) 03/05/2021            Patient has hx/o Testosterone Deficiency  (level "206" /2017) & had apparently has stopped replacement for perceived lack of benefit.                                                          Further, the patient also has history of Vitamin D Deficiency and supplements vitamin D without any suspected side-effects. Last vitamin D was at goal:  Lab Results  Component Value Date   VD25OH 68 12/05/2020     Current Outpatient Medications on File Prior to Visit  Medication Sig   amoxicillin-clavulanate  (AUGMENTIN) 875-125 MG tablet Take 1 tablet by mouth every 12 (twelve) hours. (Patient not taking: Reported on 05/10/2021)   buPROPion (WELLBUTRIN XL) 150 MG 24 hr tablet Take 1 tablet every Morning for Mood, Focus & Concentration   Cinnamon 500 MG capsule Take 500 mg by mouth daily.   escitalopram (LEXAPRO) 20 MG tablet Take 1 tablet  Daily  for Mood & Chronic Anxiety   glipiZIDE (GLUCOTROL) 5 MG tablet TAKE 1 TABLET BY MOUTH 2 TO 3 TIMES /DAY WITH MEALS FOR DIABETES (Patient taking differently: 10 mg. TAKE 1 TABLET BY MOUTH 2 TO 3 TIMES /DAY WITH MEALS FOR DIABETES)   glucose blood test strip Use as instructed   hyoscyamine (LEVSIN SL) 0.125 MG SL tablet DISSOLVE ONE TABLET UNDER THE TONGUE 4 TIMES DAILY UP TO EVERY 4 HOURS AS NEEDED FOR NAUSEA, BLOATING, CRAMPING, OR DIARRHEA   metFORMIN (GLUCOPHAGE-XR) 500 MG 24 hr tablet Take     2 tablets    2 x /day      with Meals      for Diabetes (Patient not taking: Reported on  05/10/2021)   OZEMPIC, 0.25 OR 0.5 MG/DOSE, 2 MG/1.5ML SOPN INJECT 0.25 MG EACH WEEK FOR 4 WEEKS, THEN INCREASE TO 0.5 MG EACH WEEK   Semaglutide,0.25 or 0.5MG /DOS, (OZEMPIC, 0.25 OR 0.5 MG/DOSE,) 2 MG/1.5ML SOPN Inject 0.5 mg into the skin once a week.   simvastatin (ZOCOR) 80 MG tablet Take 1 tablet (80 mg total) by mouth daily.   traZODone (DESYREL) 150 MG tablet Take  1/2 to 1 tablet  1 hour  before Bedtime for Sleep      Allergies  Allergen Reactions   Lipitor [Atorvastatin]     fatigue     PMHx:   Past Medical History:  Diagnosis Date   Abnormal EKG    DM (diabetes mellitus) ([place])    Hyperlipidemia    Hypogonadism male    IBS (irritable bowel syndrome)    Obesity    BMI 31   Vitamin D deficiency      Immunization History  Administered Date(s) Administered   Influenza,inj,Quad 09/24/2017, 09/09/2018, 10/13/2019   Influenza 09/02/2015, 09/24/2017, 09/09/2018   Janssen (J&J) SARS-COV-2 Vacc 03/07/2020   PPD Test 10/13/2018, 11/08/2019, 12/05/2020    Pneumococcal 23 10/13/2018   Pneumococcal-23 05/17/2013   Tdap 05/17/2013     Past Surgical History:  Procedure Laterality Date   APPENDECTOMY     JOINT REPLACEMENT     pt. denies    SHOULDER SURGERY Right     FHx:    Reviewed / unchanged  SHx:    Reviewed / unchanged   Systems Review:  Constitutional: Denies fever, chills, wt changes, headaches, insomnia, fatigue, night sweats, change in appetite. Eyes: Denies redness, blurred vision, diplopia, discharge, itchy, watery eyes.  ENT: Denies discharge, congestion, post nasal drip, epistaxis, sore throat, earache, hearing loss, dental pain, tinnitus, vertigo, sinus pain, snoring.  CV: Denies chest pain, palpitations, irregular heartbeat, syncope, dyspnea, diaphoresis, orthopnea, PND, claudication or edema. Respiratory: denies cough, dyspnea, DOE, pleurisy, hoarseness, laryngitis, wheezing.  Gastrointestinal: Denies dysphagia, odynophagia, heartburn, reflux, water brash, abdominal pain or cramps, nausea, vomiting, bloating, diarrhea, constipation, hematemesis, melena, hematochezia  or hemorrhoids. Genitourinary: Denies dysuria, frequency, urgency, nocturia, hesitancy, discharge, hematuria or flank pain. Musculoskeletal: Denies arthralgias, myalgias, stiffness, jt. swelling, pain, limping or strain/sprain.  Skin: Denies pruritus, rash, hives, warts, acne, eczema or change in skin lesion(s). Neuro: No weakness, tremor, incoordination, spasms, paresthesia or pain. Psychiatric: Denies confusion, memory loss or sensory loss. Endo: Denies change in weight, skin or hair change.  Heme/Lymph: No excessive bleeding, bruising or enlarged lymph nodes.  Physical Exam  BP 116/76   Pulse 71   Temp (!) 97.3 F (36.3 C)   Resp 17   Ht 6\' 2"  (1.88 m)   Wt 175 lb (79.4 kg)   SpO2 97%   BMI 22.47 kg/m   Appears  well nourished, well groomed  and in no distress.  Eyes: PERRLA, EOMs, conjunctiva no swelling or erythema. Sinuses: No  frontal/maxillary tenderness ENT/Mouth: EAC's clear, TM's nl w/o erythema, bulging. Nares clear w/o erythema, swelling, exudates. Oropharynx clear without erythema or exudates. Oral hygiene is good. Tongue normal, non obstructing. Hearing intact.  Neck: Supple. Thyroid not palpable. Car 2+/2+ without bruits, nodes or JVD. Chest: Respirations nl with BS clear & equal w/o rales, rhonchi, wheezing or stridor.  Cor: Heart sounds normal w/ regular rate and rhythm without sig. murmurs, gallops, clicks or rubs. Peripheral pulses normal and equal  without edema.  Abdomen: Soft & bowel sounds normal. Non-tender w/o guarding, rebound, hernias, masses or organomegaly.  Lymphatics: Unremarkable.  Musculoskeletal: Full ROM all peripheral extremities, joint stability, 5/5 strength and normal gait.  Skin: Warm, dry without exposed rashes, lesions or ecchymosis apparent.  Neuro: Cranial nerves intact, reflexes equal bilaterally. Sensory-motor testing grossly intact. Tendon reflexes grossly intact.  Pysch: Alert & oriented x 3.  Insight and judgement nl & appropriate. No ideations.  Assessment and Plan:  1. Labile hypertension  - Continue medication, monitor blood pressure at home.  - Continue DASH diet.  Reminder to go to the ER if any CP,  SOB, nausea, dizziness, severe HA, changes vision/speech.   - CBC with Differential/Platelet - COMPLETE METABOLIC PANEL WITH GFR - Magnesium - TSH  2.  - Continue diet/meds, exercise,& lifestyle modifications.  - Continue monitor periodic cholesterol/liver & renal functions   Hyperlipidemia associated with type 2 diabetes mellitus ([place])  - Lipid panel - TSH  3. Uncontrolled type 2 diabetes mellitus with stage 2 chronic kidney  disease, without long-term current use of insulin (HCC)  - Continue diet, exercise  - Lifestyle modifications.  - Monitor appropriate labs - Hemoglobin A1c - Insulin, random  4. Vitamin D deficiency  - Continue supplementation    - VITAMIN D 25 Hydroxy  5. Medication management  - CBC with Differential/Platelet - COMPLETE METABOLIC PANEL WITH GFR - Magnesium - Lipid panel - TSH - Hemoglobin A1c - Insulin, random - VITAMIN D 25 Hydroxy        Discussed  regular exercise, BP monitoring, weight control to achieve/maintain BMI less than 25 and discussed med and SE's. Recommended labs to assess and monitor clinical status with further disposition pending results of labs.  I discussed the assessment and treatment plan with the patient. The patient was provided an opportunity to ask questions and all were answered. The patient agreed with the plan and demonstrated an understanding of the instructions.  I provided over 30 minutes of exam, counseling, chart review and  complex critical decision making.       The patient was advised to call back or seek an in-person evaluation if the symptoms worsen or if the condition fails to improve as anticipated.   Kirtland Bouchard, MD

## 2021-06-04 NOTE — Patient Instructions (Signed)

## 2021-06-05 ENCOUNTER — Encounter: Payer: Self-pay | Admitting: Internal Medicine

## 2021-06-05 ENCOUNTER — Other Ambulatory Visit: Payer: Self-pay

## 2021-06-05 ENCOUNTER — Ambulatory Visit (INDEPENDENT_AMBULATORY_CARE_PROVIDER_SITE_OTHER): Payer: 59 | Admitting: Internal Medicine

## 2021-06-05 VITALS — BP 116/76 | HR 71 | Temp 97.3°F | Resp 17 | Ht 74.0 in | Wt 175.0 lb

## 2021-06-05 DIAGNOSIS — R0989 Other specified symptoms and signs involving the circulatory and respiratory systems: Secondary | ICD-10-CM | POA: Diagnosis not present

## 2021-06-05 DIAGNOSIS — E1122 Type 2 diabetes mellitus with diabetic chronic kidney disease: Secondary | ICD-10-CM | POA: Diagnosis not present

## 2021-06-05 DIAGNOSIS — Z79899 Other long term (current) drug therapy: Secondary | ICD-10-CM | POA: Diagnosis not present

## 2021-06-05 DIAGNOSIS — E1169 Type 2 diabetes mellitus with other specified complication: Secondary | ICD-10-CM

## 2021-06-05 DIAGNOSIS — F320 Major depressive disorder, single episode, mild: Secondary | ICD-10-CM

## 2021-06-05 DIAGNOSIS — E785 Hyperlipidemia, unspecified: Secondary | ICD-10-CM

## 2021-06-05 DIAGNOSIS — E1165 Type 2 diabetes mellitus with hyperglycemia: Secondary | ICD-10-CM

## 2021-06-05 DIAGNOSIS — E559 Vitamin D deficiency, unspecified: Secondary | ICD-10-CM | POA: Diagnosis not present

## 2021-06-05 DIAGNOSIS — IMO0002 Reserved for concepts with insufficient information to code with codable children: Secondary | ICD-10-CM

## 2021-06-05 DIAGNOSIS — N182 Chronic kidney disease, stage 2 (mild): Secondary | ICD-10-CM

## 2021-06-05 MED ORDER — BUPROPION HCL ER (XL) 300 MG PO TB24: ORAL_TABLET | ORAL | 3 refills | Status: DC

## 2021-06-06 ENCOUNTER — Other Ambulatory Visit: Payer: Self-pay | Admitting: Internal Medicine

## 2021-06-06 ENCOUNTER — Other Ambulatory Visit: Payer: Self-pay

## 2021-06-06 DIAGNOSIS — E1129 Type 2 diabetes mellitus with other diabetic kidney complication: Secondary | ICD-10-CM

## 2021-06-06 DIAGNOSIS — E1165 Type 2 diabetes mellitus with hyperglycemia: Secondary | ICD-10-CM

## 2021-06-06 NOTE — Progress Notes (Signed)
============================================================ -   Test results slightly outside the reference range are not unusual. If there is anything important, I will review this with you,  otherwise it is considered normal test values.  If you have further questions,  please do not hesitate to contact me at the office or via My Chart.  ============================================================ ============================================================  -  Glucose = 267 mg% - is too high   &  A1c = 9.2% - Also too high   - I've added a test called " Glutamic Acid Decarbox Antibody to see if you are   producing antibodies against your Pancreas & Insulin   - Usually take 1 to 2 weeks to get the result back ! ============================================================ ============================================================  -  Total Chol much better  - down from 195  to 157                                             and the bad LDL Chol down from 13 to 92 - Great  ! ============================================================ ============================================================  -  Vitamin D = 85 - Excellent  !  - Please keep dose same  ============================================================ ============================================================  -  All Else - CBC - Kidneys - Electrolytes - Liver - Magnesium & Thyroid    - all  Normal / OK ============================================================ ============================================================

## 2021-06-09 ENCOUNTER — Other Ambulatory Visit: Payer: Self-pay

## 2021-06-09 ENCOUNTER — Emergency Department (HOSPITAL_BASED_OUTPATIENT_CLINIC_OR_DEPARTMENT_OTHER): Payer: 59

## 2021-06-09 ENCOUNTER — Encounter (HOSPITAL_BASED_OUTPATIENT_CLINIC_OR_DEPARTMENT_OTHER): Payer: Self-pay

## 2021-06-09 ENCOUNTER — Emergency Department (HOSPITAL_BASED_OUTPATIENT_CLINIC_OR_DEPARTMENT_OTHER)
Admission: EM | Admit: 2021-06-09 | Discharge: 2021-06-09 | Disposition: A | Discharge status: Home / Self Care | Payer: 59 | Attending: Emergency Medicine | Admitting: Emergency Medicine

## 2021-06-09 DIAGNOSIS — N182 Chronic kidney disease, stage 2 (mild): Secondary | ICD-10-CM | POA: Insufficient documentation

## 2021-06-09 DIAGNOSIS — R109 Unspecified abdominal pain: Secondary | ICD-10-CM | POA: Diagnosis present

## 2021-06-09 DIAGNOSIS — N23 Unspecified renal colic: Secondary | ICD-10-CM | POA: Diagnosis not present

## 2021-06-09 DIAGNOSIS — Z7984 Long term (current) use of oral hypoglycemic drugs: Secondary | ICD-10-CM | POA: Diagnosis not present

## 2021-06-09 DIAGNOSIS — E1122 Type 2 diabetes mellitus with diabetic chronic kidney disease: Secondary | ICD-10-CM | POA: Insufficient documentation

## 2021-06-09 DIAGNOSIS — I129 Hypertensive chronic kidney disease with stage 1 through stage 4 chronic kidney disease, or unspecified chronic kidney disease: Secondary | ICD-10-CM | POA: Diagnosis not present

## 2021-06-09 DIAGNOSIS — Z79899 Other long term (current) drug therapy: Secondary | ICD-10-CM | POA: Insufficient documentation

## 2021-06-09 LAB — LIPID PANEL
Cholesterol: 157 mg/dL (ref <200)
HDL: 42 mg/dL (ref >=40)
LDL Cholesterol (Calc): 92 mg/dL (calc)
Non-HDL Cholesterol (Calc): 115 mg/dL (calc) (ref <130)
Total CHOL/HDL Ratio: 3.7 (calc) (ref <5.0)
Triglycerides: 133 mg/dL (ref <150)

## 2021-06-09 LAB — URINALYSIS, ROUTINE W REFLEX MICROSCOPIC
Bilirubin Urine: NEGATIVE
Glucose, UA: NEGATIVE mg/dL
Ketones, ur: 15 mg/dL — AB
Leukocytes,Ua: NEGATIVE
Nitrite: NEGATIVE
Protein, ur: 30 mg/dL — AB
Specific Gravity, Urine: >1.03 — ABNORMAL HIGH (ref 1.005–1.030)
pH: 5.5 (ref 5.0–8.0)

## 2021-06-09 LAB — VITAMIN D 25 HYDROXY (VIT D DEFICIENCY, FRACTURES): Vit D, 25-Hydroxy: 85 ng/mL (ref 30–100)

## 2021-06-09 LAB — COMPLETE METABOLIC PANEL WITH GFR
AG Ratio: 1.8 (calc) (ref 1.0–2.5)
ALT: 11 U/L (ref 9–46)
AST: 11 U/L (ref 10–35)
Albumin: 4.4 g/dL (ref 3.6–5.1)
Alkaline phosphatase (APISO): 74 U/L (ref 35–144)
BUN: 14 mg/dL (ref 7–25)
CO2: 29 mmol/L (ref 20–32)
Calcium: 9.8 mg/dL (ref 8.6–10.3)
Chloride: 102 mmol/L (ref 98–110)
Creat: 0.88 mg/dL (ref 0.70–1.35)
Globulin: 2.5 g/dL (calc) (ref 1.9–3.7)
Glucose, Bld: 267 mg/dL — ABNORMAL HIGH (ref 65–99)
Potassium: 4.3 mmol/L (ref 3.5–5.3)
Sodium: 139 mmol/L (ref 135–146)
Total Bilirubin: 0.8 mg/dL (ref 0.2–1.2)
Total Protein: 6.9 g/dL (ref 6.1–8.1)
eGFR: 96 mL/min/{1.73_m2} (ref >=60)

## 2021-06-09 LAB — CBC WITH DIFFERENTIAL/PLATELET
Absolute Monocytes: 380 cells/uL (ref 200–950)
Basophils Absolute: 31 cells/uL (ref 0–200)
Basophils Relative: 0.6 %
Eosinophils Absolute: 68 cells/uL (ref 15–500)
Eosinophils Relative: 1.3 %
HCT: 41.8 % (ref 38.5–50.0)
Hemoglobin: 14 g/dL (ref 13.2–17.1)
Lymphs Abs: 1284 cells/uL (ref 850–3900)
MCH: 32 pg (ref 27.0–33.0)
MCHC: 33.5 g/dL (ref 32.0–36.0)
MCV: 95.4 fL (ref 80.0–100.0)
MPV: 10.1 fL (ref 7.5–12.5)
Monocytes Relative: 7.3 %
Neutro Abs: 3437 cells/uL (ref 1500–7800)
Neutrophils Relative %: 66.1 %
Platelets: 149 10*3/uL (ref 140–400)
RBC: 4.38 10*6/uL (ref 4.20–5.80)
RDW: 11.9 % (ref 11.0–15.0)
Total Lymphocyte: 24.7 %
WBC: 5.2 10*3/uL (ref 3.8–10.8)

## 2021-06-09 LAB — HEMOGLOBIN A1C
Hgb A1c MFr Bld: 9.2 % of total Hgb — ABNORMAL HIGH (ref <5.7)
Mean Plasma Glucose: 217 mg/dL
eAG (mmol/L): 12 mmol/L

## 2021-06-09 LAB — URINALYSIS, MICROSCOPIC (REFLEX): RBC / HPF: >50 RBC/hpf (ref 0–5)

## 2021-06-09 LAB — TEST AUTHORIZATION

## 2021-06-09 LAB — MAGNESIUM: Magnesium: 2 mg/dL (ref 1.5–2.5)

## 2021-06-09 LAB — TSH: TSH: 1.32 mIU/L (ref 0.40–4.50)

## 2021-06-09 LAB — INSULIN, RANDOM: Insulin: 15.9 u[IU]/mL

## 2021-06-09 LAB — GLUTAMIC ACID DECARBOXYLASE AUTO ABS: Glutamic Acid Decarb Ab: <5 IU/mL (ref <5)

## 2021-06-09 MED ORDER — ONDANSETRON 4 MG PO TBDP: 4.0000 mg | ORAL_TABLET | Freq: Three times a day (TID) | ORAL | 0 refills | Status: DC | PRN

## 2021-06-09 MED ORDER — OXYCODONE HCL 5 MG PO TABS: 5.0000 mg | ORAL_TABLET | Freq: Four times a day (QID) | ORAL | 0 refills | Status: DC | PRN

## 2021-06-09 NOTE — ED Provider Notes (Signed)
[facility] EMERGENCY DEPARTMENT Provider Note   CSN: [id number] Arrival date & time: 06/09/21  1110     History Chief Complaint  Patient presents with   Flank Pain    Zachary Reid is a 65 y.o. male.  Zachary Reid experiencing waves of left-sided flank pain.  When the pain is present, it is severe.  It has been associated with nausea and vomiting.  His urine has been dark-colored.  The history is provided by the patient.  Flank Pain This is a new problem. The current episode started yesterday. The problem occurs daily. The problem has not changed since onset.Pertinent negatives include no chest pain, no abdominal pain, no headaches and no shortness of breath. Nothing aggravates the symptoms. Nothing relieves the symptoms. He has tried nothing for the symptoms. The treatment provided no relief.      Past Medical History:  Diagnosis Date   Abnormal EKG    DM (diabetes mellitus) ([place])    Hyperlipidemia    Hypogonadism male    IBS (irritable bowel syndrome)    Obesity    BMI 31   Vitamin D deficiency     Patient Active Problem List   Diagnosis Date Noted   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   Labile hypertension 10/13/2018   FH: hypertension 03/19/2018   CKD stage 2 due to type 2 diabetes mellitus ([place]) 12/17/2017   Uncontrolled type 2 diabetes mellitus with stage 2 chronic kidney disease, without long-term current use of insulin ([place]) 02/02/2016   Vitamin D deficiency 05/24/2014   Testosterone Deficiency    Overweight (BMI 25.0-29.9)    Hyperlipidemia associated with type 2 diabetes mellitus ([place]) 05/29/2011    Past Surgical History:  Procedure Laterality Date   APPENDECTOMY     JOINT REPLACEMENT     pt. denies    SHOULDER SURGERY Right        Family History  Problem Relation Age of Onset   Pancreatic cancer Mother    Prostate cancer Father        Prostate   Diabetes Father    Hypertension Father    Drug abuse  Brother    Kidney Stones Brother    Ovarian cancer Paternal Grandmother    Lung cancer Paternal Grandfather        Cigar smoker   Colon cancer Neg Hx    Colon polyps Neg Hx    Esophageal cancer Neg Hx    Stomach cancer Neg Hx    Rectal cancer Neg Hx     Social History   Tobacco Use   Smoking status: Never   Smokeless tobacco: Never  Substance Use Topics   Alcohol use: No   Drug use: No    Home Medications Prior to Admission medications   Medication Sig Start Date End Date Taking? Authorizing Provider  buPROPion (WELLBUTRIN XL) 300 MG 24 hr tablet Take 1 tablet every Morning for Mood, Focus & Concentration 06/05/21   [name], MD  Cinnamon 500 MG capsule Take 500 mg by mouth daily.    [provider]  escitalopram (LEXAPRO) 20 MG tablet Take 1 tablet  Daily  for Mood & Chronic Anxiety 04/19/21   [name], MD  glipiZIDE (GLUCOTROL) 5 MG tablet TAKE 1 TABLET BY MOUTH 2 TO 3 TIMES /DAY WITH MEALS FOR DIABETES Patient taking differently: 10 mg. TAKE 1 TABLET BY MOUTH 2 TO 3 TIMES /DAY WITH MEALS FOR DIABETES 01/11/21   Liane Comber, NP  glucose blood  test strip Use as instructed 12/19/17   Liane Comber, NP  hyoscyamine (LEVSIN SL) 0.125 MG SL tablet DISSOLVE ONE TABLET UNDER THE TONGUE 4 TIMES DAILY UP TO EVERY 4 HOURS AS NEEDED FOR NAUSEA, BLOATING, CRAMPING, OR DIARRHEA 12/18/17   Liane Comber, NP  metFORMIN (GLUCOPHAGE-XR) 500 MG 24 hr tablet Take     2 tablets    2 x /day      with Meals      for Diabetes Patient not taking: Reported on 05/10/2021 08/10/20   [name], MD  OZEMPIC, 0.25 OR 0.5 MG/DOSE, 2 MG/1.5ML SOPN INJECT 0.25 MG EACH WEEK FOR 4 WEEKS, THEN INCREASE TO 0.5 MG EACH WEEK 05/10/21   [name], MD  Semaglutide,0.25 or 0.'5MG'$ /DOS, (OZEMPIC, 0.25 OR 0.5 MG/DOSE,) 2 MG/1.5ML SOPN Inject 0.5 mg into the skin once a week. 05/12/21   [name], MD  simvastatin (ZOCOR) 80 MG tablet Take 1 tablet (80 mg total) by mouth daily.  03/05/21   Liane Comber, NP  traZODone (DESYREL) 150 MG tablet Take  1/2 to 1 tablet  1 hour  before Bedtime for Sleep 04/19/21   [name], MD    Allergies    Lipitor [atorvastatin]  Review of Systems   Review of Systems  Constitutional:  Negative for chills and fever.  HENT:  Negative for ear pain and sore throat.   Eyes:  Negative for pain and visual disturbance.  Respiratory:  Negative for cough and shortness of breath.   Cardiovascular:  Negative for chest pain and palpitations.  Gastrointestinal:  Negative for abdominal pain and vomiting.  Genitourinary:  Positive for flank pain. Negative for dysuria and hematuria.  Musculoskeletal:  Negative for arthralgias and back pain.  Skin:  Negative for color change and rash.  Neurological:  Negative for seizures, syncope and headaches.  All other systems reviewed and are negative.  Physical Exam Updated Vital Signs BP 114/74 (BP Location: Left Arm)   Pulse 62   Temp 98 F (36.7 C) (Oral)   Resp 18   Ht '6\' 2"'$  (1.88 m)   Wt 78.5 kg   SpO2 100%   BMI 22.21 kg/m   Physical Exam Vitals and nursing note reviewed.  Constitutional:      Appearance: Normal appearance.  HENT:     Head: Normocephalic and atraumatic.  Eyes:     Conjunctiva/sclera: Conjunctivae normal.  Pulmonary:     Effort: Pulmonary effort is normal. No respiratory distress.  Abdominal:     General: Bowel sounds are normal. There is no distension.     Tenderness: There is no abdominal tenderness. There is left CVA tenderness.  Musculoskeletal:        General: No deformity. Normal range of motion.     Cervical back: Normal range of motion.  Skin:    General: Skin is warm and dry.  Neurological:     General: No focal deficit present.     Mental Status: He is alert and oriented to person, place, and time. Mental status is at baseline.  Psychiatric:        Mood and Affect: Mood normal.    ED Results / Procedures / Treatments   Labs (all labs ordered  are listed, but only abnormal results are displayed) Labs Reviewed  URINALYSIS, ROUTINE W REFLEX MICROSCOPIC - Abnormal; Notable for the following components:      Result Value   APPearance HAZY (*)    Specific Gravity, Urine >1.030 (*)    Hgb urine dipstick  LARGE (*)    Ketones, ur 15 (*)    Protein, ur 30 (*)    All other components within normal limits  URINALYSIS, MICROSCOPIC (REFLEX) - Abnormal; Notable for the following components:   Bacteria, UA FEW (*)    All other components within normal limits    EKG None  Radiology DG Abd Portable 1 View  Result Date: 06/09/2021 CLINICAL DATA:  Left flank pain for 1 day with vomiting EXAM: PORTABLE ABDOMEN - 1 VIEW COMPARISON:  None. FINDINGS: No dilated small bowel loops. Mild colonic stool. No evidence of pneumatosis or pneumoperitoneum. A 4 mm calcification overlies the upper left kidney. No radiopaque stones overlie the right kidney, ureters or bladder. Mild lumbar spondylosis. IMPRESSION: Left renal stone. Nonobstructive bowel gas pattern. Electronically Signed   By: Ilona Sorrel M.D.   On: 06/09/2021 14:10    Procedures Procedures   Medications Ordered in ED Medications - No data to display  ED Course  I have reviewed the triage vital signs and the nursing notes.  Pertinent labs & imaging results that were available during my care of the patient were reviewed by me and considered in my medical decision making (see chart for details).    MDM Rules/Calculators/A&P                           Zachary Reid presented with flank pain consistent with renal colic.  Urinalysis revealed blood.  He was asymptomatic when I saw him but has been getting waves of pain.  We had no CT available today at [facility].  I spoke with the patient about options including ultrasound looking for hydronephrosis, transfer to another facility with CT capabilities, symptomatic management.  He was not interested in going to another facility for a  repeat CT and agreed to come back if symptoms persist.  KUB does show a proximal renal stone.  He was given symptomatic treatment.  Careful return precautions were discussed. Final Clinical Impression(s) / ED Diagnoses Final diagnoses:  Renal colic on left side    Rx / DC Orders ED Discharge Orders          Ordered    oxyCODONE (ROXICODONE) 5 MG immediate release tablet  Every 6 hours PRN        06/09/21 1419    ondansetron (ZOFRAN ODT) 4 MG disintegrating tablet  Every 8 hours PRN        06/09/21 1419             Arnaldo Natal, MD 06/09/21 1601

## 2021-06-09 NOTE — ED Triage Notes (Signed)
Pt c/o left flank pain with discolored urine. Denies burning with urination.

## 2021-06-15 ENCOUNTER — Ambulatory Visit (INDEPENDENT_AMBULATORY_CARE_PROVIDER_SITE_OTHER): Payer: 59 | Admitting: Urology

## 2021-06-15 ENCOUNTER — Encounter: Payer: Self-pay | Admitting: Urology

## 2021-06-15 ENCOUNTER — Other Ambulatory Visit: Payer: Self-pay

## 2021-06-15 VITALS — BP 106/75 | HR 82 | Ht 74.0 in | Wt 170.0 lb

## 2021-06-15 DIAGNOSIS — N201 Calculus of ureter: Secondary | ICD-10-CM

## 2021-06-15 DIAGNOSIS — N200 Calculus of kidney: Secondary | ICD-10-CM | POA: Diagnosis not present

## 2021-06-15 LAB — URINALYSIS, COMPLETE
Bilirubin, UA: NEGATIVE
Ketones, UA: NEGATIVE
Leukocytes,UA: NEGATIVE
Nitrite, UA: NEGATIVE
RBC, UA: NEGATIVE
Specific Gravity, UA: >1.03 — ABNORMAL HIGH (ref 1.005–1.030)
Urobilinogen, Ur: 0.2 mg/dL (ref 0.2–1.0)
pH, UA: 5.5 (ref 5.0–7.5)

## 2021-06-15 LAB — MICROSCOPIC EXAMINATION: Bacteria, UA: NONE SEEN

## 2021-06-15 NOTE — Progress Notes (Signed)
06/15/2021 3:54 PM   Zachary Reid 1956/10/09 [id number]  Referring provider: [name], MD [address] [place],  [place] [address]  Chief Complaint  Patient presents with   New Patient (Initial Visit)    HPI: Zachary Reid is a 65 y.o. male who presents for follow-up of a recent ED visit for renal colic.  Seen [facility] ED 06/09/2021 with a 24-hour history of left flank pain that had become severe and intermittent No precipitating, aggravating or alleviating factors No fever, chills + Nausea/vomiting UA with >50 RBCs KUB with a 4 mm calcification overlying the upper pole of the left renal outline CT imaging not available and patient discharged on oxycodone/Zofran Since ED visit he has severe pain has significantly improved several days ago he was having increased urinary frequency, sensation of incomplete emptying and voiding small amounts.  Not aware of passing a stone He has had right flank pain intermittently for the past 3 years No dysuria or gross hematuria No prior history stone disease   PMH: Past Medical History:  Diagnosis Date   Abnormal EKG    DM (diabetes mellitus) ([place])    Hyperlipidemia    Hypogonadism male    IBS (irritable bowel syndrome)    Obesity    BMI 31   Vitamin D deficiency     Surgical History: Past Surgical History:  Procedure Laterality Date   APPENDECTOMY     JOINT REPLACEMENT     pt. denies    SHOULDER SURGERY Right     Home Medications:  Allergies as of 06/15/2021       Reactions   Lipitor [atorvastatin]    fatigue        Medication List        Accurate as of June 15, 2021  3:54 PM. If you have any questions, ask your nurse or doctor.          STOP taking these medications    metFORMIN 500 MG 24 hr tablet Commonly known as: GLUCOPHAGE-XR Stopped by: Abbie Sons, MD   Ozempic (0.25 or 0.5 MG/DOSE) 2 MG/1.5ML Sopn Generic drug: Semaglutide(0.25 or 0.'5MG'$ /DOS) Stopped  by: Abbie Sons, MD       TAKE these medications    buPROPion 300 MG 24 hr tablet Commonly known as: Wellbutrin XL Take 1 tablet every Morning for Mood, Focus & Concentration   Cinnamon 500 MG capsule Take 500 mg by mouth daily.   escitalopram 20 MG tablet Commonly known as: Lexapro Take 1 tablet  Daily  for Mood & Chronic Anxiety   glipiZIDE 5 MG tablet Commonly known as: GLUCOTROL TAKE 1 TABLET BY MOUTH 2 TO 3 TIMES /DAY WITH MEALS FOR DIABETES What changed: how much to take   glucose blood test strip Use as instructed   hyoscyamine 0.125 MG SL tablet Commonly known as: LEVSIN SL DISSOLVE ONE TABLET UNDER THE TONGUE 4 TIMES DAILY UP TO EVERY 4 HOURS AS NEEDED FOR NAUSEA, BLOATING, CRAMPING, OR DIARRHEA   ondansetron 4 MG disintegrating tablet Commonly known as: Zofran ODT Take 1 tablet (4 mg total) by mouth every 8 (eight) hours as needed for nausea or vomiting.   oxyCODONE 5 MG immediate release tablet Commonly known as: Roxicodone Take 1 tablet (5 mg total) by mouth every 6 (six) hours as needed for up to 6 doses for severe pain.   simvastatin 80 MG tablet Commonly known as: ZOCOR Take 1 tablet (80 mg total) by mouth daily.  traZODone 150 MG tablet Commonly known as: DESYREL Take  1/2 to 1 tablet  1 hour  before Bedtime for Sleep        Allergies:  Allergies  Allergen Reactions   Lipitor [Atorvastatin]     fatigue    Family History: Family History  Problem Relation Age of Onset   Pancreatic cancer Mother    Prostate cancer Father        Prostate   Diabetes Father    Hypertension Father    Drug abuse Brother    Kidney Stones Brother    Ovarian cancer Paternal Grandmother    Lung cancer Paternal Grandfather        Cigar smoker   Colon cancer Neg Hx    Colon polyps Neg Hx    Esophageal cancer Neg Hx    Stomach cancer Neg Hx    Rectal cancer Neg Hx     Social History:  reports that he has never smoked. He has never used smokeless  tobacco. He reports that he does not drink alcohol and does not use drugs.   Physical Exam: BP 106/75   Pulse 82   Ht '6\' 2"'$  (1.88 m)   Wt 170 lb (77.1 kg)   BMI 21.83 kg/m   Constitutional:  Alert and oriented, No acute distress. HEENT: [place] AT, moist mucus membranes.  Trachea midline, no masses. Cardiovascular: No clubbing, cyanosis, or edema. Respiratory: Normal respiratory effort, no increased work of breathing. GI: Abdomen is soft, nontender, nondistended, no abdominal masses GU: No CVA tenderness Lymph: No cervical or inguinal lymphadenopathy. Skin: No rashes, bruises or suspicious lesions. Neurologic: Grossly intact, no focal deficits, moving all 4 extremities. Psychiatric: Normal mood and affect.  Laboratory Data:  Urinalysis Dipstick negative/microscopy 6-10 WBC  Pertinent Imaging: Portable KUB performed 06/09/2021 was personally reviewed and interpreted.  On my review there is a 7 mm calcification overlying the midportion of the left renal outline and a 4 mm calcification just below theL4 transverse process   Assessment & Plan:    1.  Left ureteral calculus Recent irritative symptoms may indicate either passage or migration to the distal ureter Intermittent right flank pain for the past 3 years Recommend stone protocol CT abdomen pelvis to evaluate for persistent left ureteral calculus and any right sided urinary calculi UA today with mild pyuria; asymptomatic.  Urine culture ordered   2.  Left nephrolithiasis Options were discussed of observation, shockwave lithotripsy and ureteroscopy Recommended a metabolic evaluation to include 24-hour urine study and blood work   Abbie Sons, [place] [address], [place], [place] [address] [phone]

## 2021-06-17 ENCOUNTER — Encounter: Payer: Self-pay | Admitting: Urology

## 2021-06-19 LAB — CULTURE, URINE COMPREHENSIVE

## 2021-06-25 ENCOUNTER — Other Ambulatory Visit: Payer: Self-pay | Admitting: Adult Health

## 2021-06-26 ENCOUNTER — Other Ambulatory Visit: Payer: Self-pay

## 2021-06-26 ENCOUNTER — Ambulatory Visit
Admission: RE | Admit: 2021-06-26 | Discharge: 2021-06-26 | Disposition: A | Discharge status: Home / Self Care | Payer: 59 | Source: Ambulatory Visit | Attending: Urology | Admitting: Urology

## 2021-06-26 DIAGNOSIS — N201 Calculus of ureter: Secondary | ICD-10-CM | POA: Diagnosis not present

## 2021-06-26 DIAGNOSIS — N200 Calculus of kidney: Secondary | ICD-10-CM | POA: Insufficient documentation

## 2021-06-28 ENCOUNTER — Other Ambulatory Visit: Payer: Self-pay | Admitting: Internal Medicine

## 2021-06-28 ENCOUNTER — Encounter: Payer: Self-pay | Admitting: Urology

## 2021-06-28 ENCOUNTER — Other Ambulatory Visit: Payer: Self-pay

## 2021-06-28 ENCOUNTER — Ambulatory Visit (INDEPENDENT_AMBULATORY_CARE_PROVIDER_SITE_OTHER): Payer: 59 | Admitting: Internal Medicine

## 2021-06-28 ENCOUNTER — Encounter: Payer: Self-pay | Admitting: Internal Medicine

## 2021-06-28 ENCOUNTER — Telehealth: Payer: Self-pay | Admitting: Hematology

## 2021-06-28 VITALS — BP 118/70 | HR 91 | Temp 97.1°F | Resp 16 | Ht 74.0 in | Wt 173.0 lb

## 2021-06-28 DIAGNOSIS — C801 Malignant (primary) neoplasm, unspecified: Secondary | ICD-10-CM

## 2021-06-28 DIAGNOSIS — C787 Secondary malignant neoplasm of liver and intrahepatic bile duct: Secondary | ICD-10-CM

## 2021-06-28 DIAGNOSIS — C7800 Secondary malignant neoplasm of unspecified lung: Secondary | ICD-10-CM

## 2021-06-28 DIAGNOSIS — C259 Malignant neoplasm of pancreas, unspecified: Secondary | ICD-10-CM

## 2021-06-28 HISTORY — DX: Malignant (primary) neoplasm, unspecified: C80.1

## 2021-06-28 NOTE — Telephone Encounter (Signed)
Received a new pt referral from Dr. Melford Aase for pancreatic cancer. Mr. Zachary Reid returned my call and has been scheduled to see Dr. Burr Medico on 8/16 at 11:20pm. Pt aware to arrive 20 minutes early.

## 2021-06-28 NOTE — Progress Notes (Signed)
Leonie Douglas referral coordinator from [facility] called with a referral for this patient.  She als said she would help facilitate any tests needed before his consult with Dr Burr Medico.  Per Dr Ernestina Penna recommendation a CT chest without contrast and MR Liver with and without contrast will be ordered and scheduled by [place].  Katrina will also review his medical record tos see if he has a relationship with any local GI physicians as he will most likely need an EUS guided biopsy.

## 2021-06-28 NOTE — Progress Notes (Signed)
      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   - 30 minutes spent in consultation with patient and he is agreeable to expedited referral to oncology

## 2021-07-02 ENCOUNTER — Encounter (HOSPITAL_COMMUNITY): Payer: Self-pay

## 2021-07-02 ENCOUNTER — Ambulatory Visit (HOSPITAL_COMMUNITY)
Admission: RE | Admit: 2021-07-02 | Discharge: 2021-07-02 | Disposition: A | Discharge status: Home / Self Care | Payer: 59 | Source: Ambulatory Visit | Attending: Internal Medicine | Admitting: Internal Medicine

## 2021-07-02 ENCOUNTER — Other Ambulatory Visit: Payer: Self-pay

## 2021-07-02 ENCOUNTER — Other Ambulatory Visit: Payer: Self-pay | Admitting: Internal Medicine

## 2021-07-02 DIAGNOSIS — K8689 Other specified diseases of pancreas: Secondary | ICD-10-CM | POA: Insufficient documentation

## 2021-07-02 DIAGNOSIS — C259 Malignant neoplasm of pancreas, unspecified: Secondary | ICD-10-CM

## 2021-07-02 DIAGNOSIS — C7800 Secondary malignant neoplasm of unspecified lung: Secondary | ICD-10-CM

## 2021-07-02 DIAGNOSIS — C787 Secondary malignant neoplasm of liver and intrahepatic bile duct: Secondary | ICD-10-CM

## 2021-07-02 MED ORDER — GADOBUTROL 1 MMOL/ML IV SOLN
7.0000 mL | Freq: Once | INTRAVENOUS | Status: AC | PRN
  Administered 2021-07-02: 7 mL via INTRAVENOUS

## 2021-07-03 ENCOUNTER — Inpatient Hospital Stay: Payer: 59

## 2021-07-03 ENCOUNTER — Inpatient Hospital Stay: Payer: 59 | Attending: Hematology | Admitting: Hematology

## 2021-07-03 ENCOUNTER — Encounter: Payer: Self-pay | Admitting: Hematology

## 2021-07-03 ENCOUNTER — Other Ambulatory Visit: Payer: Self-pay

## 2021-07-03 DIAGNOSIS — K769 Liver disease, unspecified: Secondary | ICD-10-CM | POA: Insufficient documentation

## 2021-07-03 DIAGNOSIS — Z801 Family history of malignant neoplasm of trachea, bronchus and lung: Secondary | ICD-10-CM | POA: Insufficient documentation

## 2021-07-03 DIAGNOSIS — I700 Atherosclerosis of aorta: Secondary | ICD-10-CM | POA: Insufficient documentation

## 2021-07-03 DIAGNOSIS — Z800 Family history of malignant neoplasm of digestive organs: Secondary | ICD-10-CM | POA: Diagnosis not present

## 2021-07-03 DIAGNOSIS — Z7984 Long term (current) use of oral hypoglycemic drugs: Secondary | ICD-10-CM | POA: Insufficient documentation

## 2021-07-03 DIAGNOSIS — K8689 Other specified diseases of pancreas: Secondary | ICD-10-CM

## 2021-07-03 DIAGNOSIS — K869 Disease of pancreas, unspecified: Secondary | ICD-10-CM | POA: Diagnosis not present

## 2021-07-03 DIAGNOSIS — K589 Irritable bowel syndrome without diarrhea: Secondary | ICD-10-CM | POA: Insufficient documentation

## 2021-07-03 DIAGNOSIS — R059 Cough, unspecified: Secondary | ICD-10-CM | POA: Diagnosis not present

## 2021-07-03 DIAGNOSIS — Z79899 Other long term (current) drug therapy: Secondary | ICD-10-CM | POA: Diagnosis not present

## 2021-07-03 DIAGNOSIS — E785 Hyperlipidemia, unspecified: Secondary | ICD-10-CM | POA: Insufficient documentation

## 2021-07-03 DIAGNOSIS — E1165 Type 2 diabetes mellitus with hyperglycemia: Secondary | ICD-10-CM | POA: Insufficient documentation

## 2021-07-03 DIAGNOSIS — R197 Diarrhea, unspecified: Secondary | ICD-10-CM | POA: Diagnosis not present

## 2021-07-03 DIAGNOSIS — E559 Vitamin D deficiency, unspecified: Secondary | ICD-10-CM | POA: Diagnosis not present

## 2021-07-03 DIAGNOSIS — Z8041 Family history of malignant neoplasm of ovary: Secondary | ICD-10-CM | POA: Diagnosis not present

## 2021-07-03 DIAGNOSIS — N200 Calculus of kidney: Secondary | ICD-10-CM | POA: Insufficient documentation

## 2021-07-03 DIAGNOSIS — R918 Other nonspecific abnormal finding of lung field: Secondary | ICD-10-CM | POA: Diagnosis not present

## 2021-07-03 LAB — CMP (CANCER CENTER ONLY)
ALT: 14 U/L (ref 0–44)
AST: 15 U/L (ref 15–41)
Albumin: 4.8 g/dL (ref 3.5–5.0)
Alkaline Phosphatase: 82 U/L (ref 38–126)
Anion gap: 10 (ref 5–15)
BUN: 12 mg/dL (ref 8–23)
CO2: 26 mmol/L (ref 22–32)
Calcium: 10.3 mg/dL (ref 8.9–10.3)
Chloride: 103 mmol/L (ref 98–111)
Creatinine: 1.05 mg/dL (ref 0.61–1.24)
GFR, Estimated: >60 mL/min (ref >60)
Glucose, Bld: 268 mg/dL — ABNORMAL HIGH (ref 70–99)
Potassium: 4.5 mmol/L (ref 3.5–5.1)
Sodium: 139 mmol/L (ref 135–145)
Total Bilirubin: 0.9 mg/dL (ref 0.3–1.2)
Total Protein: 8 g/dL (ref 6.5–8.1)

## 2021-07-03 LAB — CBC WITH DIFFERENTIAL (CANCER CENTER ONLY)
Abs Immature Granulocytes: 0.02 10*3/uL (ref 0.00–0.07)
Basophils Absolute: 0 10*3/uL (ref 0.0–0.1)
Basophils Relative: 1 %
Eosinophils Absolute: 0.1 10*3/uL (ref 0.0–0.5)
Eosinophils Relative: 1 %
HCT: 43.1 % (ref 39.0–52.0)
Hemoglobin: 15.4 g/dL (ref 13.0–17.0)
Immature Granulocytes: 0 %
Lymphocytes Relative: 21 %
Lymphs Abs: 1.2 10*3/uL (ref 0.7–4.0)
MCH: 32.5 pg (ref 26.0–34.0)
MCHC: 35.7 g/dL (ref 30.0–36.0)
MCV: 90.9 fL (ref 80.0–100.0)
Monocytes Absolute: 0.4 10*3/uL (ref 0.1–1.0)
Monocytes Relative: 7 %
Neutro Abs: 4.2 10*3/uL (ref 1.7–7.7)
Neutrophils Relative %: 70 %
Platelet Count: 166 10*3/uL (ref 150–400)
RBC: 4.74 MIL/uL (ref 4.22–5.81)
RDW: 12 % (ref 11.5–15.5)
WBC Count: 5.9 10*3/uL (ref 4.0–10.5)
nRBC: 0 % (ref 0.0–0.2)

## 2021-07-03 LAB — CEA (IN HOUSE-CHCC): CEA (CHCC-In House): 3.5 ng/mL (ref 0.00–5.00)

## 2021-07-03 NOTE — Progress Notes (Signed)
[facility]   [phone] Fax:([phone]) [place] Note   Patient Care Team: [name], MD as PCP - General (Internal Medicine) [name], MD (Internal Medicine) [name], MD as Consulting Physician (Orthopedic Surgery) [name], MD as Consulting Physician (Cardiology)  Date of Service:  07/03/2021   CHIEF COMPLAINTS/PURPOSE OF CONSULTATION:  Abnormal imaging findings   REFERRING PHYSICIAN:  Dr. Rana Snare   HISTORY OF PRESENTING ILLNESS:  Zachary Reid 65 y.o. male is a here because of his recent abnormal imaging findings. The patient was referred by PCP Dr. Mateo Flow. The patient presents to the clinic today accompanied by his wife.   Patient noticed epigastric pain after he started anxiety medicine which were prescribed by Dr. Wyn Forster about a month ago.  So he subsequently stopped the medicine, and his epigastric pain has improved some.  He also noticed intermittent mild diarrhea, no hematochezia or melena, nausea, vomiting, or abdominal bloating.  He has chronic right flank pain with intermittent flare for the past few months.  He did have trauma and right-sided rib fracture several years ago, but he does not think his pain is related to trauma.  He presented to the ED on 06/09/21 with left flank pain and vomiting. Abdomen x-ray performed at that time showed a left renal stone. He also developed gross hematuria, prompting renal stone study CT on 06/26/21 showing: innumerable small pulmonary nodules throughout included lung bases, some of the largest of which are cavitary; a 2.5 cm hypodense lesion of anterior pancreatic head, concerning for primary pancreatic malignancy; multiple fluid attenuation simple cysts or hemangiomata of liver; no other evidence of candidate primary malignancy. He was referred to [place] for further evaluation.  He proceeded to further imaging studies yesterday, 07/02/21. Abdomen MRI with MRCP showed:  sub-optimal characterization of previously demonstrated low-density pancreatic and hepatic lesions due to motion artifact; pancreatic lesions appear cystic; no concerning liver lesions or abdominal adenopathy. Chest CT showed: widespread pulmonary metastatic disease; pancreatic mass; low-attenuation hepatic lesions.  He has been DM for 7-8 year, it is overall poorly controlled, his last A1c was 9.2%.  He has recently increased his glipizide to 4 times a day, and his morning blood glucose has come down to 100-1 50.   Review of system shows mild dry cough in the past 3 weeks, no hemoptysis, dyspnea, or chest discomfort.  He has gradually lost a total of 50 pounds in the past 7 years, with 25 pound weight loss since January 2022.  He has good energy level and functions normally.  His appetite was low in early 2022, but improved lately.  No other new complaints.     REVIEW OF SYSTEMS:    Constitutional: Denies fevers, chills or abnormal night sweats, (+) weight loss  Eyes: Denies blurriness of vision, double vision or watery eyes Ears, nose, mouth, throat, and face: Denies mucositis or sore throat Respiratory: (+) dry cough, no dyspnea or wheezes Cardiovascular: Denies palpitation, chest discomfort or lower extremity swelling Gastrointestinal:  Denies nausea, heartburn or change in bowel habits Skin: Denies abnormal skin rashes Lymphatics: Denies new lymphadenopathy or easy bruising Neurological:Denies numbness, tingling or new weaknesses Behavioral/Psych: Mood is stable, no new changes  All other systems were reviewed with the patient and are negative.   MEDICAL HISTORY:  Past Medical History:  Diagnosis Date   Abnormal EKG    DM (diabetes mellitus) ([place])    Hyperlipidemia    Hypogonadism male    IBS (  irritable bowel syndrome)    Obesity    BMI 31   pancreatic ca 06/28/2021   Vitamin D deficiency     SURGICAL HISTORY: Past Surgical History:  Procedure Laterality Date   APPENDECTOMY      JOINT REPLACEMENT     pt. denies    SHOULDER SURGERY Right     SOCIAL HISTORY: Social History   Socioeconomic History   Marital status: Married    Spouse name: Not on file   Number of children: 1   Years of education: Not on file   Highest education level: Not on file  Occupational History   Not on file  Tobacco Use   Smoking status: Never   Smokeless tobacco: Never  Substance and Sexual Activity   Alcohol use: No   Drug use: No   Sexual activity: Never  Other Topics Concern   Not on file  Social History Narrative   Not on file   Social Determinants of Health   Financial Resource Strain: Not on file  Food Insecurity: Not on file  Transportation Needs: Not on file  Physical Activity: Not on file  Stress: Not on file  Social Connections: Not on file  Intimate Partner Violence: Not on file    FAMILY HISTORY: Family History  Problem Relation Age of Onset   Pancreatic cancer Mother 26   Prostate cancer Father 29       Prostate   Diabetes Father    Hypertension Father    Drug abuse Brother    Kidney Stones Brother    Ovarian cancer Paternal Grandmother 72   Lung cancer Paternal Grandfather        Cigar smoker   Colon cancer Neg Hx    Colon polyps Neg Hx    Esophageal cancer Neg Hx    Stomach cancer Neg Hx    Rectal cancer Neg Hx     ALLERGIES:  is allergic to lipitor [atorvastatin].  MEDICATIONS:  Current Outpatient Medications  Medication Sig Dispense Refill   buPROPion (WELLBUTRIN XL) 300 MG 24 hr tablet Take 1 tablet every Morning for Mood, Focus & Concentration 90 tablet 3   Cinnamon 500 MG capsule Take 500 mg by mouth daily.     escitalopram (LEXAPRO) 20 MG tablet Take 1 tablet  Daily  for Mood & Chronic Anxiety 90 tablet 1   glipiZIDE (GLUCOTROL) 5 MG tablet TAKE 1 TABLET BY MOUTH 2 TO 3 TIMES /DAY WITH MEALS FOR DIABETES 270 tablet 1   glucose blood test strip Use as instructed 100 each 12   hyoscyamine (LEVSIN SL) 0.125 MG SL tablet DISSOLVE  ONE TABLET UNDER THE TONGUE 4 TIMES DAILY UP TO EVERY 4 HOURS AS NEEDED FOR NAUSEA, BLOATING, CRAMPING, OR DIARRHEA 100 tablet 0   ondansetron (ZOFRAN ODT) 4 MG disintegrating tablet Take 1 tablet (4 mg total) by mouth every 8 (eight) hours as needed for nausea or vomiting. 20 tablet 0   oxyCODONE (ROXICODONE) 5 MG immediate release tablet Take 1 tablet (5 mg total) by mouth every 6 (six) hours as needed for up to 6 doses for severe pain. 6 tablet 0   simvastatin (ZOCOR) 80 MG tablet Take 1 tablet (80 mg total) by mouth daily. 90 tablet 1   traZODone (DESYREL) 150 MG tablet Take  1/2 to 1 tablet  1 hour  before Bedtime for Sleep 90 tablet 1   No current facility-administered medications for this visit.    PHYSICAL EXAMINATION: ECOG PERFORMANCE STATUS: 1 -  Symptomatic but completely ambulatory  Vitals:   07/03/21 1059  BP: 132/83  Pulse: 94  Resp: 19  Temp: 98.4 F (36.9 C)  SpO2: 99%   Filed Weights   07/03/21 1059  Weight: 165 lb 12.8 oz (75.2 kg)    GENERAL:alert, no distress and comfortable SKIN: skin color, texture, turgor are normal, no rashes or significant lesions EYES: normal, Conjunctiva are pink and non-injected, sclera clear NECK: supple, thyroid normal size, non-tender, without nodularity LYMPH:  no palpable lymphadenopathy in the cervical, axillary  LUNGS: clear to auscultation and percussion with normal breathing effort HEART: regular rate & rhythm and no murmurs and no lower extremity edema ABDOMEN:abdomen soft, non-tender and normal bowel sounds Musculoskeletal:no cyanosis of digits and no clubbing  NEURO: alert & oriented x 3 with fluent speech, no focal motor/sensory deficits  LABORATORY DATA:  I have reviewed the data as listed CBC Latest Ref Rng & Units 07/03/2021 06/05/2021 03/05/2021  WBC 4.0 - 10.5 K/uL 5.9 5.2 5.4  Hemoglobin 13.0 - 17.0 g/dL 15.4 14.0 14.6  Hematocrit 39.0 - 52.0 % 43.1 41.8 43.6  Platelets 150 - 400 K/uL 166 149 182    CMP Latest  Ref Rng & Units 07/03/2021 06/05/2021 03/05/2021  Glucose 70 - 99 mg/dL 268(H) 267(H) 255(H)  BUN 8 - 23 mg/dL '12 14 16  '$ Creatinine 0.61 - 1.24 mg/dL 1.05 0.88 0.76  Sodium 135 - 145 mmol/L 139 139 140  Potassium 3.5 - 5.1 mmol/L 4.5 4.3 4.4  Chloride 98 - 111 mmol/L 103 102 104  CO2 22 - 32 mmol/L '26 29 26  '$ Calcium 8.9 - 10.3 mg/dL 10.3 9.8 9.7  Total Protein 6.5 - 8.1 g/dL 8.0 6.9 6.9  Total Bilirubin 0.3 - 1.2 mg/dL 0.9 0.8 0.7  Alkaline Phos 38 - 126 U/L 82 - -  AST 15 - 41 U/L '15 11 13  '$ ALT 0 - 44 U/L '14 11 12     '$ RADIOGRAPHIC STUDIES: I have personally reviewed the radiological images as listed and agreed with the findings in the report. CT Chest Wo Contrast  Result Date: 07/02/2021 CLINICAL DATA:  Pancreatic cancer staging. EXAM: CT CHEST WITHOUT CONTRAST TECHNIQUE: Multidetector CT imaging of the chest was performed following the standard protocol without IV contrast. COMPARISON:  MR abdomen 07/02/2021 and CT abdomen pelvis 06/26/2021. FINDINGS: Cardiovascular: Coronary artery calcification. Heart size normal. No pericardial effusion. Mediastinum/Nodes: No pathologically enlarged mediastinal or axillary lymph nodes. Hilar regions are difficult to definitively evaluate without IV contrast. Esophagus is grossly unremarkable. Lungs/Pleura: Widespread hematogenously distributed pulmonary nodules bilaterally, many of which are cavitary. Index nodule in the medial right lower lobe measures 9 mm (7/131). No pleural fluid. Airway is unremarkable. Upper Abdomen: Low-attenuation lesions in the liver measure up to 3.1 cm in the left hepatic lobe. Visualized portions of the liver, gallbladder, adrenal glands, kidneys and spleen are otherwise unremarkable. Pancreatic head/neck mass, better assessed on MR abdomen performed the same day. Visualized portions of the stomach and bowel are grossly unremarkable. Musculoskeletal: Degenerative changes in the spine. Old right rib fracture. No worrisome lytic or  sclerotic lesions. IMPRESSION: 1. Widespread pulmonary metastatic disease. 2. Pancreatic mass, better assessed on MR abdomen performed the same day. 3. Low-attenuation hepatic lesions, also better assessed on MR abdomen performed the same day. 4. Coronary artery calcification. Electronically Signed   By: Lorin Picket M.D.   On: 07/02/2021 12:38   MR ABDOMEN WWO CONTRAST  Result Date: 07/02/2021 CLINICAL DATA:  Pancreatic mass with indeterminate  pulmonary nodules and liver lesions on noncontrast CT. Suspected metastatic pancreatic cancer. EXAM: MRI ABDOMEN WITHOUT AND WITH CONTRAST (INCLUDING MRCP) TECHNIQUE: Multiplanar multisequence MR imaging of the abdomen was performed both before and after the administration of intravenous contrast. Heavily T2-weighted images of the biliary and pancreatic ducts were obtained, and three-dimensional MRCP images were rendered by post processing. CONTRAST:  25m GADAVIST GADOBUTROL 1 MMOL/ML IV SOLN COMPARISON:  Noncontrast abdominopelvic CT 06/26/2021. FINDINGS: Despite efforts by the technologist and patient, mild motion artifact is present on today's exam and could not be eliminated. This reduces exam sensitivity and specificity. Lower chest: As seen on recent abdominal CT, there are innumerable small nodules at both lung bases which are not further characterized. No dominant mass or significant pleural effusion identified. Patient is scheduled for chest CT today. Hepatobiliary: No significant hepatic steatosis or morphologic changes of cirrhosis. As seen on CT, there are numerous cystic lesions throughout the liver, largest 2.3 cm anteriorly in the right lobe on image 12/6 and 3.0 cm in the left lobe on image 13/6. No significant enhancement of these lesions is seen on the postcontrast images which are moderately degraded by motion. No evidence of gallstones, gallbladder wall thickening or biliary dilatation. Pancreas: There are multiple cystic-appearing masses associated  with the pancreatic head and body. The pancreatic body and tail appear foreshortened. Dominant cystic lesion projecting along the anterior aspect of the pancreatic head measures 2.7 x 1.8 x 3.6 cm and is septated. There are several smaller simple appearing cystic lesions. Assessment for enhancement associated with these lesions is limited by motion on the postcontrast images. No pancreatic ductal dilatation or surrounding inflammation. Spleen: Normal in size without focal abnormality. Adrenals/Urinary Tract: Both adrenal glands appear normal. No evidence of renal mass or hydronephrosis. Small right renal parapelvic cyst. Stomach/Bowel: The stomach appears unremarkable for its degree of distension. No evidence of bowel wall thickening, distention or surrounding inflammatory change. Vascular/Lymphatic: There are no enlarged abdominal lymph nodes. No significant vascular findings. Other: No evidence of abdominal wall hernia or ascites. Musculoskeletal: No acute or significant osseous findings. IMPRESSION: 1. The previously demonstrated low-density pancreatic and hepatic lesions are suboptimally characterized by the present study due to motion artifact, although these lesions appears cystic without suspicious enhancement. Therefore, they are seemingly unrelated to the numerous pulmonary nodules which could reflect metastatic disease. Patient is scheduled for follow-up chest CT later today. 2. The pancreatic lesions appears cystic and could reflect sequela of pancreatitis or cystic neoplasms. Clearly, management depends on the etiology for the pulmonary findings. If the patient is able to receive iodinated contrast, follow-up abdominopelvic CT with and without contrast in 3-4 months recommended. If unable to receive iodinated contrast, follow-up MRI could be performed, although may again be limited by motion. 3. No concerning liver lesions or abdominal adenopathy. Electronically Signed   By: WRichardean SaleM.D.   On:  07/02/2021 12:48   MR 3D Recon At Scanner  Result Date: 07/02/2021 CLINICAL DATA:  Pancreatic mass with indeterminate pulmonary nodules and liver lesions on noncontrast CT. Suspected metastatic pancreatic cancer. EXAM: MRI ABDOMEN WITHOUT AND WITH CONTRAST (INCLUDING MRCP) TECHNIQUE: Multiplanar multisequence MR imaging of the abdomen was performed both before and after the administration of intravenous contrast. Heavily T2-weighted images of the biliary and pancreatic ducts were obtained, and three-dimensional MRCP images were rendered by post processing. CONTRAST:  730mGADAVIST GADOBUTROL 1 MMOL/ML IV SOLN COMPARISON:  Noncontrast abdominopelvic CT 06/26/2021. FINDINGS: Despite efforts by the technologist and patient, mild motion  artifact is present on today's exam and could not be eliminated. This reduces exam sensitivity and specificity. Lower chest: As seen on recent abdominal CT, there are innumerable small nodules at both lung bases which are not further characterized. No dominant mass or significant pleural effusion identified. Patient is scheduled for chest CT today. Hepatobiliary: No significant hepatic steatosis or morphologic changes of cirrhosis. As seen on CT, there are numerous cystic lesions throughout the liver, largest 2.3 cm anteriorly in the right lobe on image 12/6 and 3.0 cm in the left lobe on image 13/6. No significant enhancement of these lesions is seen on the postcontrast images which are moderately degraded by motion. No evidence of gallstones, gallbladder wall thickening or biliary dilatation. Pancreas: There are multiple cystic-appearing masses associated with the pancreatic head and body. The pancreatic body and tail appear foreshortened. Dominant cystic lesion projecting along the anterior aspect of the pancreatic head measures 2.7 x 1.8 x 3.6 cm and is septated. There are several smaller simple appearing cystic lesions. Assessment for enhancement associated with these lesions is  limited by motion on the postcontrast images. No pancreatic ductal dilatation or surrounding inflammation. Spleen: Normal in size without focal abnormality. Adrenals/Urinary Tract: Both adrenal glands appear normal. No evidence of renal mass or hydronephrosis. Small right renal parapelvic cyst. Stomach/Bowel: The stomach appears unremarkable for its degree of distension. No evidence of bowel wall thickening, distention or surrounding inflammatory change. Vascular/Lymphatic: There are no enlarged abdominal lymph nodes. No significant vascular findings. Other: No evidence of abdominal wall hernia or ascites. Musculoskeletal: No acute or significant osseous findings. IMPRESSION: 1. The previously demonstrated low-density pancreatic and hepatic lesions are suboptimally characterized by the present study due to motion artifact, although these lesions appears cystic without suspicious enhancement. Therefore, they are seemingly unrelated to the numerous pulmonary nodules which could reflect metastatic disease. Patient is scheduled for follow-up chest CT later today. 2. The pancreatic lesions appears cystic and could reflect sequela of pancreatitis or cystic neoplasms. Clearly, management depends on the etiology for the pulmonary findings. If the patient is able to receive iodinated contrast, follow-up abdominopelvic CT with and without contrast in 3-4 months recommended. If unable to receive iodinated contrast, follow-up MRI could be performed, although may again be limited by motion. 3. No concerning liver lesions or abdominal adenopathy. Electronically Signed   By: Richardean Sale M.D.   On: 07/02/2021 12:48   DG Abd Portable 1 View  Result Date: 06/09/2021 CLINICAL DATA:  Left flank pain for 1 day with vomiting EXAM: PORTABLE ABDOMEN - 1 VIEW COMPARISON:  None. FINDINGS: No dilated small bowel loops. Mild colonic stool. No evidence of pneumatosis or pneumoperitoneum. A 4 mm calcification overlies the upper left  kidney. No radiopaque stones overlie the right kidney, ureters or bladder. Mild lumbar spondylosis. IMPRESSION: Left renal stone. Nonobstructive bowel gas pattern. Electronically Signed   By: Ilona Sorrel M.D.   On: 06/09/2021 14:10   CT RENAL STONE STUDY  Result Date: 06/27/2021 CLINICAL DATA:  Left flank pain, intermittent gross hematuria, nephrolithiasis EXAM: CT ABDOMEN AND PELVIS WITHOUT CONTRAST TECHNIQUE: Multidetector CT imaging of the abdomen and pelvis was performed following the standard protocol without IV contrast. COMPARISON:  None. FINDINGS: Lower chest: Innumerable small pulmonary nodules throughout the included lung bases, some of the largest of which are cavitary, for example a 0.8 cm cavitary nodule of the left lower lobe (series 3, image 4) and a 0.9 cm solid nodule of the right lower lobe (series 3, image  5). Hepatobiliary: There are multiple fluid attenuation simple cysts or hemangiomata of the liver, as well as additional small, less clearly fluid attenuation lesions that are incompletely characterized by noncontrast CT, for example in the anterior right lobe of the liver a 0.7 cm lesion (series 2, image 17). No gallstones, gallbladder wall thickening, or biliary dilatation. Pancreas: There is a hypodense lesion of the anterior pancreatic head measuring 2.5 x 1.9 cm (series 2, image 21). No pancreatic ductal dilatation or surrounding inflammatory changes. Spleen: Normal in size without significant abnormality. Adrenals/Urinary Tract: Adrenal glands are unremarkable. Small nonobstructive calculus of the superior pole of the left kidney (series 4, image 92). Bladder is unremarkable. Stomach/Bowel: Stomach is within normal limits. Appendix is surgically absent. No evidence of bowel wall thickening, distention, or inflammatory changes. Vascular/Lymphatic: Aortic atherosclerosis. No enlarged abdominal or pelvic lymph nodes. Reproductive: No mass or other significant abnormality. Other: No  abdominal wall hernia or abnormality. No abdominopelvic ascites. Musculoskeletal: No acute or significant osseous findings. IMPRESSION: 1. Innumerable small pulmonary nodules throughout the included lung bases, some of the largest of which are cavitary, highly concerning for pulmonary metastatic disease. 2. There is a hypodense lesion of the anterior pancreatic head measuring 2.5 x 1.9 cm. This is concerning for primary pancreatic malignancy although may represent a pancreatic cystic lesion. Recommend further evaluation with contrast enhanced MRI of the abdomen. 3. There are multiple fluid attenuation simple cysts or hemangiomata of the liver, as well as additional small, less clearly fluid attenuation lesions that are incompletely characterized by noncontrast CT, the latter concerning for hepatic metastatic disease and as above, recommend contrast enhanced MRI of the abdomen. 4. No other noncontrast CT evidence of candidate primary malignancy in the abdomen or pelvis. 5. Small nonobstructive calculus of the superior pole of the left kidney. No right-sided calculi, ureteral calculi, or hydronephrosis. These results will be called to the ordering clinician or representative by the Radiologist Assistant, and communication documented in the PACS or [employer]. Aortic Atherosclerosis (ICD10-I70.0). Electronically Signed   By: Eddie Candle M.D.   On: 06/27/2021 12:32    ASSESSMENT & PLAN:  Eldo Wohler is a 65 y.o.  male with a history of poorly controlled DM   1. Cystic lesions in pancrease and liver, numerous small cavitary lesions in bilateral lungs, suspicious for metastatic cancer -This was incidentally found on his recent CT scan for left kidney stone.  He does have some nonspecific symptoms, including epigastric pain, chronic right flank pain, and a dry cough.  He has significant weight loss in the past several years. -I reviewed his CT, MRI images with patient and his wife in detail.  We discussed  that MRI imaging was limited by motion, but it showed the lesions in pancreas and liver are cystic, likely benign.  However his cavitary lung lesions are suspicious for metastatic cancer with unknown primary. -I recommended PET/CT for further evaluation, especially for primary tumor -We will review his case in our GI conference, may consider EGD, and EUS for biopsy of his pancreatic lesion.  He is also overdue for colonoscopy, would consider repeating colonoscopy to rule out GI primary. -if PET and GI work up negative, I would refer him to pulmonary for bronchoscopy to rule out infectious etiology, his lung lesions are too small to biopsy    2. DM  -f/u with PCP  3. Left kidney stone and right frank pain -f/u urology    PLAN:  -PET scan in a few weeks -lab today  -  GI conference discussion -will discuss with GI about EUS, EGD and colonoscopy -f/u after the above workup    Orders Placed This Encounter  Procedures   NM PET Image Initial (PI) Skull Base To Thigh    Lung nodules suspicious for metastatic cancer, unknown primary.    Standing Status:   Future    Standing Expiration Date:   07/03/2022    Order Specific Question:   If indicated for the ordered procedure, I authorize the administration of a radiopharmaceutical per Radiology protocol    Answer:   Yes    Order Specific Question:   Preferred imaging location?    Answer:   [facility]   CBC with Differential (Cancer Center Only)    Standing Status:   Future    Number of Occurrences:   1    Standing Expiration Date:   07/03/2022   CMP ([facility] only)    Standing Status:   Future    Number of Occurrences:   1    Standing Expiration Date:   07/03/2022   CEA (IN HOUSE-[facility])FOR [facility] ONLY    Standing Status:   Future    Number of Occurrences:   1    Standing Expiration Date:   07/03/2022   CA 19.9    Standing Status:   Future    Number of Occurrences:   1    Standing Expiration Date:   07/03/2022   Ambulatory  Referral to [facility]    Referral Priority:   Routine    Referral Type:   Consultation    Referral Reason:   Specialty Services Required    Number of Visits Requested:   1    Pt had many questions. All questions were answered. The patient knows to call the clinic with any problems, questions or concerns. The total time spent in the appointment was 60 minutes.     [name], MD 07/03/2021   I, Wilburn Mylar, am acting as scribe for [name], MD.   I have reviewed the above documentation for accuracy and completeness, and I agree with the above.

## 2021-07-03 NOTE — Progress Notes (Signed)
I met with Mr Zachary Reid and his wife after his consultation with Dr Feng.  I explained my role as Oncology Nurse Navigator and provided my contact information.  I explained that we will need to get insurance prior authorization for the PET scan that Dr Feng is ordering.  I told him after the PA is obtained I will schedule the PET and call him with that appt date and time.  They verbalized understanding. 

## 2021-07-04 ENCOUNTER — Encounter: Payer: Self-pay | Admitting: Hematology

## 2021-07-04 LAB — CANCER ANTIGEN 19-9: CA 19-9: 273 U/mL — ABNORMAL HIGH (ref 0–35)

## 2021-07-04 NOTE — Progress Notes (Signed)
Zachary Reid called regarding his elevated Ca 19-9 level.  I reviewed the possible origin of CA 19-9.  The possible significance the the elevation.  I told him this is just one tool we use to distinguish the possible origin growth of certain cancers. I let him know we are awaiting insurance authorization for the PET scan.  All questions were answered.  He verbalized understanding.

## 2021-07-05 ENCOUNTER — Telehealth: Payer: Self-pay | Admitting: Hematology

## 2021-07-05 ENCOUNTER — Other Ambulatory Visit: Payer: Self-pay

## 2021-07-05 ENCOUNTER — Telehealth: Payer: Self-pay

## 2021-07-05 DIAGNOSIS — K862 Cyst of pancreas: Secondary | ICD-10-CM

## 2021-07-05 DIAGNOSIS — Z1211 Encounter for screening for malignant neoplasm of colon: Secondary | ICD-10-CM

## 2021-07-05 NOTE — Telephone Encounter (Signed)
-----   Message from Irving Copas., MD sent at 07/05/2021  7:48 AM EDT ----- Chong Sicilian, On 9/8, my second patient ERCP will not be needed.  I am doing her procedure today.  Please place this patient for EGD/EUS possible FNA pancreatic cysts plus colonoscopy to evaluate for colon cancer screening and for potential etiology of metastatic disease.  Please let Dr. Burr Medico and I know when she is scheduled.  Thanks. 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 This would be an unusual pattern for primary pancreatic cancer but certainly warrants further testing.  My first available outpatient time for a combined EUS and colonoscopy is Sept 15th and if nothing is available sooner from [place] review of his schedule then we can put him on for Sept 15th.  thanks  ----- Message ----- From: [name], MD Sent: 07/04/2021   5:45 PM EDT To: Milus Banister, MD, #  Thanks much Gabe! That sounds a great plan, appreciate your help. Hope you or Linna Hoff can get him in next 2-3 weeks.   His last colonoscopy was in 2007 (done at [facility]?), he is overdue, and prefers to do it here.   Thanks   Krista Blue    ----- Message ----- From: Irving Copas., MD Sent: 07/04/2021   5:28 PM EDT To: Milus Banister, MD, [name], MD, #  YF, Based on the size of the cyst, he would meet criteria to consider EUS and possible FNA. Enhancement of the cystic area patient feel less of an issue without significant PD dilation either upstream so overt obstruction does not seem likely but with the question and elevated CA 19-9 I do think EUS with aspiration is not unreasonable.  If he is not up-to-date from a colonoscopy standpoint as well, we could consider trying to do colonoscopy on the same day. I will know by tomorrow if I we will have an  opening on September 8 where in which I could try to put him in the books for EGD-EUS/colonoscopy.  Not sure if DJ has anything sooner or would entertain doing the colonoscopy at the same time as EUS.  Give [place] a day or so to see what transpires with my schedule and see DJ's thoughts as well. I do think being evaluated by pulmonary seems like a reasonable next step 2. Will be in touch. GM ----- Message ----- From: [name], MD Sent: 07/04/2021   4:26 PM EDT To: Milus Banister, MD, #  Jacqulyn Cane and Chester Holstein,  This guy was incidentally found to have numerous small lung nodules, and cystic pancreatic and liver lesions which appear to be likely benign on MRI. Could you review his CT scans and let me know if you would biopsy/aspirate his pancreatic lesion to see if this is malignant? I have ordered PET, but no other primary site showed on CT. He does have some epigastric pain, my other thought is do EGD and colonoscopy to make sure it's not GI primary.   I did tell pt that this could be non-malignant, if nothing we found, we will f/u him by scans, or send him to pulmonary.    Thanks, appreciate your help as always,  Krista Blue

## 2021-07-05 NOTE — Telephone Encounter (Signed)
EGD EUS Colon scheduled for 07/26/21 at 845 am at [facility] with GM

## 2021-07-05 NOTE — Telephone Encounter (Signed)
Scheduled appt per 8/16 sch msg. Called pt, no answer. Left msg with appt date and time.

## 2021-07-06 ENCOUNTER — Other Ambulatory Visit: Payer: Self-pay | Admitting: Hematology

## 2021-07-06 MED ORDER — LORAZEPAM 1 MG PO TABS: 1.0000 mg | ORAL_TABLET | Freq: Once | ORAL | 0 refills | Status: AC

## 2021-07-06 MED ORDER — PEG 3350-KCL-NA BICARB-NACL 420 G PO SOLR: 4000.0000 mL | Freq: Once | ORAL | 0 refills | Status: AC

## 2021-07-06 NOTE — Telephone Encounter (Signed)
Left message on machine to call back  

## 2021-07-06 NOTE — Telephone Encounter (Signed)
EUS/EGD/Colon scheduled, pt instructed and medications reviewed.  Patient instructions mailed to home and sent to My Chart.  Patient to call with any questions or concerns.

## 2021-07-06 NOTE — Progress Notes (Signed)
Mr Zachary Reid returned my call.  I reviewed the PET scan appt date, time, and instructions.  He verbalized understanding.  He states he is claustrophobic and is requesting something to take prior to the PET scan.  I will forward this request to Dr Burr Medico.  I also told him we will be discussing his case during our [facility] this week.

## 2021-07-10 ENCOUNTER — Inpatient Hospital Stay: Payer: 59 | Admitting: Dietician

## 2021-07-10 NOTE — Progress Notes (Signed)
Nutrition  Patient did not show for scheduled nutrition appointment today.  

## 2021-07-11 ENCOUNTER — Other Ambulatory Visit: Payer: Self-pay

## 2021-07-11 NOTE — Progress Notes (Signed)
The proposed treatment discussed in conference is for discussion purpose only and is not a binding recommendation.  The patients have not been physically examined, or presented with their treatment options.  Therefore, final treatment plans cannot be decided.  

## 2021-07-12 ENCOUNTER — Other Ambulatory Visit: Payer: Self-pay | Admitting: *Deleted

## 2021-07-12 ENCOUNTER — Other Ambulatory Visit: Payer: Self-pay | Admitting: Hematology

## 2021-07-12 ENCOUNTER — Encounter: Payer: Self-pay | Admitting: *Deleted

## 2021-07-12 MED ORDER — ALPRAZOLAM 0.25 MG PO TABS: 0.2500 mg | ORAL_TABLET | Freq: Two times a day (BID) | ORAL | 0 refills | Status: DC | PRN

## 2021-07-12 NOTE — Progress Notes (Signed)
Zachary Reid called stating he is having anxiety attacks especially at night at which time "he gets in his truck and drives with the windows down."  He is requesting something for anxiety and sleep.  He has taken Ambien in the past for sleep.

## 2021-07-12 NOTE — Progress Notes (Unsigned)
xanax

## 2021-07-12 NOTE — Progress Notes (Signed)
The proposed treatment discussed in cancer conference is for discussion purpose only and is not a binding recommendation. The patient was not physically examined nor present for their treatment options. Therefore, final treatment plans cannot be decided.  ?

## 2021-07-13 ENCOUNTER — Other Ambulatory Visit: Payer: Self-pay

## 2021-07-13 ENCOUNTER — Telehealth: Payer: Self-pay | Admitting: Hematology

## 2021-07-13 DIAGNOSIS — R918 Other nonspecific abnormal finding of lung field: Secondary | ICD-10-CM

## 2021-07-13 DIAGNOSIS — F411 Generalized anxiety disorder: Secondary | ICD-10-CM

## 2021-07-13 DIAGNOSIS — K8689 Other specified diseases of pancreas: Secondary | ICD-10-CM

## 2021-07-13 NOTE — Progress Notes (Signed)
I spoke with Zachary Reid regarding new rx for xanax.  I reviewed instructions for use and advised he should not drive or operate heavy equipment after taking xanax.  I also let him know Dr Burr Medico recommends he see Dr Michail Sermon in our psychology/behavior health department for additional treatment for his anxiety.  He was agreeable.  I have placed a referral for this.  Zachary Reid verbalized understanding.

## 2021-07-13 NOTE — Telephone Encounter (Signed)
Scheduled appt per 8/26 sch msg. Called pt, no answer. Left msg with appt date and time.

## 2021-07-19 ENCOUNTER — Other Ambulatory Visit: Payer: Self-pay

## 2021-07-19 ENCOUNTER — Encounter (HOSPITAL_COMMUNITY)
Admission: RE | Admit: 2021-07-19 | Discharge: 2021-07-19 | Disposition: A | Discharge status: Home / Self Care | Payer: 59 | Source: Ambulatory Visit | Attending: Hematology | Admitting: Hematology

## 2021-07-19 DIAGNOSIS — C259 Malignant neoplasm of pancreas, unspecified: Secondary | ICD-10-CM

## 2021-07-19 DIAGNOSIS — C7800 Secondary malignant neoplasm of unspecified lung: Secondary | ICD-10-CM

## 2021-07-19 DIAGNOSIS — R918 Other nonspecific abnormal finding of lung field: Secondary | ICD-10-CM | POA: Insufficient documentation

## 2021-07-19 HISTORY — DX: Malignant neoplasm of pancreas, unspecified: C25.9

## 2021-07-19 HISTORY — DX: Secondary malignant neoplasm of unspecified lung: C78.00

## 2021-07-19 LAB — GLUCOSE, CAPILLARY: Glucose-Capillary: 121 mg/dL — ABNORMAL HIGH (ref 70–99)

## 2021-07-19 MED ORDER — FLUDEOXYGLUCOSE F - 18 (FDG) INJECTION
8.3000 | Freq: Once | INTRAVENOUS | Status: AC
  Administered 2021-07-19: 8 via INTRAVENOUS

## 2021-07-22 ENCOUNTER — Encounter: Payer: Self-pay | Admitting: Internal Medicine

## 2021-07-22 DIAGNOSIS — I700 Atherosclerosis of aorta: Secondary | ICD-10-CM | POA: Insufficient documentation

## 2021-07-24 NOTE — Progress Notes (Signed)
I spoke with Mr Zachary Reid and let him know that Dr Burr Medico would like to see him this Friday 07/27/2021 at 1620.  He was agreeable and his appt has been rescheduled.

## 2021-07-25 NOTE — Progress Notes (Signed)
Spoke with pt instructed him not to take metformin day of surgery 07/26/2021

## 2021-07-26 ENCOUNTER — Encounter (HOSPITAL_COMMUNITY): Payer: Self-pay | Admitting: Gastroenterology

## 2021-07-26 ENCOUNTER — Ambulatory Visit (HOSPITAL_COMMUNITY): Payer: 59 | Admitting: Certified Registered Nurse Anesthetist

## 2021-07-26 ENCOUNTER — Other Ambulatory Visit: Payer: Self-pay

## 2021-07-26 ENCOUNTER — Encounter (HOSPITAL_COMMUNITY)
Admission: RE | Disposition: A | Discharge status: Home / Self Care | Payer: Self-pay | Source: Home / Self Care | Attending: Gastroenterology

## 2021-07-26 ENCOUNTER — Ambulatory Visit (HOSPITAL_COMMUNITY)
Admission: RE | Admit: 2021-07-26 | Discharge: 2021-07-26 | Disposition: A | Discharge status: Home / Self Care | Payer: 59 | Attending: Gastroenterology | Admitting: Gastroenterology

## 2021-07-26 DIAGNOSIS — C251 Malignant neoplasm of body of pancreas: Secondary | ICD-10-CM | POA: Diagnosis not present

## 2021-07-26 DIAGNOSIS — K862 Cyst of pancreas: Secondary | ICD-10-CM

## 2021-07-26 DIAGNOSIS — Z801 Family history of malignant neoplasm of trachea, bronchus and lung: Secondary | ICD-10-CM | POA: Diagnosis not present

## 2021-07-26 DIAGNOSIS — K644 Residual hemorrhoidal skin tags: Secondary | ICD-10-CM | POA: Diagnosis not present

## 2021-07-26 DIAGNOSIS — K3189 Other diseases of stomach and duodenum: Secondary | ICD-10-CM | POA: Insufficient documentation

## 2021-07-26 DIAGNOSIS — E119 Type 2 diabetes mellitus without complications: Secondary | ICD-10-CM | POA: Insufficient documentation

## 2021-07-26 DIAGNOSIS — K5730 Diverticulosis of large intestine without perforation or abscess without bleeding: Secondary | ICD-10-CM | POA: Insufficient documentation

## 2021-07-26 DIAGNOSIS — Z800 Family history of malignant neoplasm of digestive organs: Secondary | ICD-10-CM | POA: Insufficient documentation

## 2021-07-26 DIAGNOSIS — K642 Third degree hemorrhoids: Secondary | ICD-10-CM | POA: Insufficient documentation

## 2021-07-26 DIAGNOSIS — Z8042 Family history of malignant neoplasm of prostate: Secondary | ICD-10-CM | POA: Diagnosis not present

## 2021-07-26 DIAGNOSIS — Z7984 Long term (current) use of oral hypoglycemic drugs: Secondary | ICD-10-CM | POA: Diagnosis not present

## 2021-07-26 DIAGNOSIS — Z888 Allergy status to other drugs, medicaments and biological substances status: Secondary | ICD-10-CM | POA: Insufficient documentation

## 2021-07-26 DIAGNOSIS — Z841 Family history of disorders of kidney and ureter: Secondary | ICD-10-CM | POA: Diagnosis not present

## 2021-07-26 DIAGNOSIS — D123 Benign neoplasm of transverse colon: Secondary | ICD-10-CM | POA: Diagnosis not present

## 2021-07-26 DIAGNOSIS — Z1211 Encounter for screening for malignant neoplasm of colon: Secondary | ICD-10-CM | POA: Insufficient documentation

## 2021-07-26 DIAGNOSIS — D126 Benign neoplasm of colon, unspecified: Secondary | ICD-10-CM | POA: Diagnosis not present

## 2021-07-26 DIAGNOSIS — Z833 Family history of diabetes mellitus: Secondary | ICD-10-CM | POA: Insufficient documentation

## 2021-07-26 DIAGNOSIS — C257 Malignant neoplasm of other parts of pancreas: Secondary | ICD-10-CM | POA: Diagnosis not present

## 2021-07-26 DIAGNOSIS — K8689 Other specified diseases of pancreas: Secondary | ICD-10-CM

## 2021-07-26 DIAGNOSIS — Z8041 Family history of malignant neoplasm of ovary: Secondary | ICD-10-CM | POA: Insufficient documentation

## 2021-07-26 HISTORY — PX: ESOPHAGOGASTRODUODENOSCOPY (EGD) WITH PROPOFOL: SHX5813

## 2021-07-26 HISTORY — PX: POLYPECTOMY: SHX5525

## 2021-07-26 HISTORY — PX: BIOPSY: SHX5522

## 2021-07-26 HISTORY — PX: EUS: SHX5427

## 2021-07-26 HISTORY — PX: COLONOSCOPY WITH PROPOFOL: SHX5780

## 2021-07-26 HISTORY — PX: FINE NEEDLE ASPIRATION: SHX5430

## 2021-07-26 LAB — GLUCOSE, CAPILLARY
Glucose-Capillary: 132 mg/dL — ABNORMAL HIGH (ref 70–99)
Glucose-Capillary: 172 mg/dL — ABNORMAL HIGH (ref 70–99)

## 2021-07-26 SURGERY — ESOPHAGOGASTRODUODENOSCOPY (EGD) WITH PROPOFOL
Anesthesia: Monitor Anesthesia Care

## 2021-07-26 MED ORDER — PROPOFOL 500 MG/50ML IV EMUL
INTRAVENOUS | Status: DC | PRN
  Administered 2021-07-26: 150 ug/kg/min via INTRAVENOUS

## 2021-07-26 MED ORDER — HYDROCORTISONE ACETATE 25 MG RE SUPP: 25.0000 mg | Freq: Every day | RECTAL | 0 refills | Status: AC

## 2021-07-26 MED ORDER — CIPROFLOXACIN IN D5W 400 MG/200ML IV SOLN
INTRAVENOUS | Status: AC
  Filled 2021-07-26: qty 200

## 2021-07-26 MED ORDER — LIDOCAINE HCL URETHRAL/MUCOSAL 2 % EX GEL
CUTANEOUS | Status: AC
  Filled 2021-07-26: qty 6

## 2021-07-26 MED ORDER — PROPOFOL 10 MG/ML IV BOLUS
INTRAVENOUS | Status: DC | PRN
  Administered 2021-07-26 (×2): 30 mg via INTRAVENOUS
  Administered 2021-07-26: 10 mg via INTRAVENOUS

## 2021-07-26 MED ORDER — LIDOCAINE 2% (20 MG/ML) 5 ML SYRINGE
INTRAMUSCULAR | Status: DC | PRN
  Administered 2021-07-26: 80 mg via INTRAVENOUS

## 2021-07-26 MED ORDER — LACTATED RINGERS IV SOLN: INTRAVENOUS | Status: DC | PRN

## 2021-07-26 MED ORDER — CIPROFLOXACIN HCL 500 MG PO TABS: 500.0000 mg | ORAL_TABLET | Freq: Two times a day (BID) | ORAL | 0 refills | Status: AC

## 2021-07-26 MED ORDER — CIPROFLOXACIN IN D5W 400 MG/200ML IV SOLN
INTRAVENOUS | Status: DC | PRN
  Administered 2021-07-26: 400 mg via INTRAVENOUS

## 2021-07-26 MED ORDER — SODIUM CHLORIDE 0.9 % IV SOLN: INTRAVENOUS | Status: DC

## 2021-07-26 MED ORDER — LACTATED RINGERS IV SOLN: Freq: Once | INTRAVENOUS | Status: AC

## 2021-07-26 SURGICAL SUPPLY — 24 items

## 2021-07-26 NOTE — H&P (Signed)
GASTROENTEROLOGY PROCEDURE H&P NOTE   Primary Care Physician: [name], MD  HPI: Zachary Reid is a 65 y.o. male who presents for EGD/EUS + Colonoscopy to evaluate for potential metastatic cancer (significant pulmonary lesions) and now a PET scan with light-up in the Pancreas.  Past Medical History:  Diagnosis Date   Abnormal EKG    DM (diabetes mellitus) ([place])    Hyperlipidemia    Hypogonadism male    IBS (irritable bowel syndrome)    Obesity    BMI 31   pancreatic ca 06/28/2021   Vitamin D deficiency    Past Surgical History:  Procedure Laterality Date   APPENDECTOMY     SHOULDER SURGERY Right    Current Facility-Administered Medications  Medication Dose Route Frequency Provider Last Rate Last Admin   0.9 %  sodium chloride infusion   Intravenous Continuous Mansouraty, Telford Nab., MD       Facility-Administered Medications Ordered in Other Encounters  Medication Dose Route Frequency Provider Last Rate Last Admin   lactated ringers infusion   Intravenous Continuous PRN Genelle Bal, CRNA   New Bag at 07/26/21 0730    Current Facility-Administered Medications:    0.9 %  sodium chloride infusion, , Intravenous, Continuous, Mansouraty, Telford Nab., MD  Facility-Administered Medications Ordered in Other Encounters:    lactated ringers infusion, , Intravenous, Continuous PRN, Genelle Bal, CRNA, New Bag at 07/26/21 0730 Allergies  Allergen Reactions   Lipitor [Atorvastatin]     fatigue   Family History  Problem Relation Age of Onset   Pancreatic cancer Mother 10   Prostate cancer Father 20       Prostate   Diabetes Father    Hypertension Father    Drug abuse Brother    Kidney Stones Brother    Ovarian cancer Paternal Grandmother 52   Lung cancer Paternal Grandfather        Cigar smoker   Colon cancer Neg Hx    Colon polyps Neg Hx    Esophageal cancer Neg Hx    Stomach cancer Neg Hx    Rectal cancer Neg Hx    Social History    Socioeconomic History   Marital status: Married    Spouse name: Not on file   Number of children: 1   Years of education: Not on file   Highest education level: Not on file  Occupational History   Not on file  Tobacco Use   Smoking status: Never   Smokeless tobacco: Never  Substance and Sexual Activity   Alcohol use: No   Drug use: No   Sexual activity: Never  Other Topics Concern   Not on file  Social History Narrative   Not on file   Social Determinants of Health   Financial Resource Strain: Not on file  Food Insecurity: Not on file  Transportation Needs: Not on file  Physical Activity: Not on file  Stress: Not on file  Social Connections: Not on file  Intimate Partner Violence: Not on file    Physical Exam: Vital signs in last 24 hours: Temp:  [98.4 F (36.9 C)] 98.4 F (36.9 C) (09/08 0722) Pulse Rate:  [102] 102 (09/08 0722) Resp:  [22] 22 (09/08 0722) BP: (134)/(74) 134/74 (09/08 0722) SpO2:  [98 %] 98 % (09/08 0722) Weight:  [72.6 kg] 72.6 kg (09/08 0722)   GEN: NAD EYE: Sclerae anicteric ENT: MMM CV: Non-tachycardic GI: Soft, NT/ND NEURO:  Alert & Oriented x 3  Lab Results: No results  for input(s): WBC, HGB, HCT, PLT in the last 72 hours. BMET No results for input(s): NA, K, CL, CO2, GLUCOSE, BUN, CREATININE, CALCIUM in the last 72 hours. LFT No results for input(s): PROT, ALBUMIN, AST, ALT, ALKPHOS, BILITOT, BILIDIR, IBILI in the last 72 hours. PT/INR No results for input(s): LABPROT, INR in the last 72 hours.   Impression / Plan: This is a 65 y.o.male who presents for EGD/EUS + Colonoscopy to evaluate for potential metastatic cancer (significant pulmonary lesions) and now a PET scan with light-up in the Pancreas.  The risks and benefits of endoscopic evaluation/treatment were discussed with the patient and/or family; these include but are not limited to the risk of perforation, infection, bleeding, missed lesions, lack of diagnosis, severe  illness requiring hospitalization, as well as anesthesia and sedation related illnesses.  The patient's history has been reviewed, patient examined, no change in status, and deemed stable for procedure.  The patient and/or family is agreeable to proceed.    Justice Britain, MD [facility] # [phone]

## 2021-07-26 NOTE — Op Note (Signed)
[facility] Patient Name: Zachary Reid Procedure Date : 07/26/2021 MRN: [id number] Attending MD: Justice Britain , MD Date of Birth: 08-Mar-1956 CSN: [id number] Age: 65 Admit Type: Outpatient Procedure:                Upper EUS Indications:              Suspected mass in pancreas on CT scan, Abnormal                            abdominal PET scan, Suspected pancreatic neoplasm,                            Abdominal pain in the left upper quadrant, Weight                            loss Providers:                Justice Britain, MD, Burtis Junes, RN, Cletis Athens,                            Technician Referring MD:             [name], [name] Medicines:                Monitored Anesthesia Care Complications:            No immediate complications. Estimated Blood Loss:     Estimated blood loss was minimal. Procedure:                Pre-Anesthesia Assessment:                           - Prior to the procedure, a History and Physical                            was performed, and patient medications and                            allergies were reviewed. The patient's tolerance of                            previous anesthesia was also reviewed. The risks                            and benefits of the procedure and the sedation                            options and risks were discussed with the patient.                            All questions were answered, and informed consent                            was obtained. Prior Anticoagulants: The patient has  taken no previous anticoagulant or antiplatelet                            agents except for aspirin. ASA Grade Assessment:                            III - A patient with severe systemic disease. After                            reviewing the risks and benefits, the patient was                            deemed in satisfactory condition to undergo the                             procedure.                           After obtaining informed consent, the endoscope was                            passed under direct vision. Throughout the                            procedure, the patient's blood pressure, pulse, and                            oxygen saturations were monitored continuously. The                            GIF-H190 ([id number]) Olympus endoscope was introduced                            through the mouth, and advanced to the second part                            of duodenum. The TJF- Q180V ([id number]) Olympus                            duodenoscope was introduced through the mouth, and                            advanced to the area of papilla. After obtaining                            informed consent, the endoscope was passed under                            direct vision. Throughout the procedure, the                            patient's blood pressure, pulse, and oxygen  saturations were monitored continuously.The upper                            GI endoscopy was accomplished without difficulty.                            The patient tolerated the procedure. The GF-UCT180                            ([id number]) Olympus linear ultrasound scope was                            introduced through the mouth, and advanced to the                            duodenum for ultrasound examination from the                            stomach and duodenum. Scope In: Scope Out: Findings:      ENDOSCOPIC FINDING: :      No gross lesions were noted in the entire esophagus.      The Z-line was irregular and was found 43 cm from the incisors.      Patchy mildly erythematous mucosa without bleeding was found in the       entire examined stomach. Biopsies were taken with a cold forceps for       histology and Helicobacter pylori testing.      No gross lesions were noted in the duodenal bulb, in the first portion       of the duodenum and in the  second portion of the duodenum.      The major papilla was normal.      ENDOSONOGRAPHIC FINDING: :      An irregular mass was identified in the genu-body of the pancreas. The       mass was mixed solid and cystic. The solid component of the mass-like       region measured 33 mm by 29 mm in maximal cross-sectional diameter. The       cystic component of the mass-like regin measured 35 mm by 27 mm in       maximal cross-sectinal diameter. The endosonographic borders were       poorly-defined. 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 The remainder       of the pancreas was examined. The endosonographic appearance of       parenchyma and the upstream pancreatic duct indicated parenchymal       atrophy. Fine needle biopsy was performed. Color Doppler imaging was       utilized prior to needle puncture to confirm a lack of significant       vascular structures within the needle path. Six passes were made with       the Acquire 22 gauge ultrasound core biopsy needle using a transgastric       approach. Visible cores of tissue were obtained. Preliminary cytologic       examination and touch preps were performed. Preliminary cytology is       suspicious for adenocarcinoma (final results are pending). Diagnostic  needle aspiration for fluid was performed. Color Doppler imaging was       utilized prior to needle puncture to confirm a lack of significant       vascular structures within the needle path. One pass was made with the       Acquire 22 gauge needle using a transgastric approach. A stylet was       used. The amount of fluid collected was 2 mL. The fluid was cloudy,       serosanguinous and viscous. Sample(s) were sent for albumin       concentration and CEA and potentially GNAS/KRAS evaluation.      No malignant-appearing lymph nodes were visualized in the celiac region       (level 20), perigastric region and  peripancreatic region.      Anechoic lesions suggestive of a few cysts were identified in the       visualized portion of the liver. The largest lesion measured 28 mm by 21       mm in maximal cross-sectional diameter. There was no associated mass.      The region of the celiac plexus and celiac ganglia was visualized. Impression:               EGD Impression:                           - No gross lesions in esophagus. Z-line irregular,                            43 cm from the incisors.                           - Erythematous mucosa in the stomach. Biopsied.                           - No gross lesions in the duodenal bulb, in the                            first portion of the duodenum and in the second                            portion of the duodenum.                           - Normal major papilla.                           EUS Impression:                           - A solid-cystic mass-like region was identified in                            the genu-body of the pancreas. Tissue was obtained                            from this exam, and results are pending. However,  the endosonographic appearance is highly suspicious                            for adenocarcinoma (possibly transformed IPMN vs                            MCN). 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 The staging                            applies if malignancy is confirmed. Would utilize                            MRI/CT for further staging. Fine needle biopsy                            performed of the mass. Fine needle aspiration for                            fluid performed of the cystic portion.                           - No malignant-appearing lymph nodes were                            visualized in the celiac region (level 20),                            perigastric region and peripancreatic region.                            - A few cystic lesions were found in the visualized                            portion of the liver, the largest measuring 28 mm                            by 21 mm. Recommendation:           - Proceed to scheduled colonoscopy.                           - Observe patient's clinical course.                           - Await cytology results and await path results.                           - Monitor for signs/symptoms of bleeding,                            perforation, and infection. If issues please call  our number to get further assistance as needed.                           - Ciprofloxacin 500 mg twice daily x 5-days.                           - The findings and recommendations were discussed                            with the patient.                           - The findings and recommendations were discussed                            with the patient's family. Procedure Code(s):        --- Professional ---                           [phone], Esophagogastroduodenoscopy, flexible,                            transoral; with transendoscopic ultrasound-guided                            intramural or transmural fine needle                            aspiration/biopsy(s), (includes endoscopic                            ultrasound examination limited to the esophagus,                            stomach or duodenum, and adjacent structures) Diagnosis Code(s):        --- Professional ---                           K22.8, Other specified diseases of esophagus                           K31.89, Other diseases of stomach and duodenum                           K86.89, Other specified diseases of pancreas                           I89.9, Noninfective disorder of lymphatic vessels                            and lymph nodes, unspecified                           K76.89, Other specified diseases of liver                           R10.12,  Left upper quadrant pain                            R63.4, Abnormal weight loss                           R93.3, Abnormal findings on diagnostic imaging of                            other parts of digestive tract                           R93.5, Abnormal findings on diagnostic imaging of                            other abdominal regions, including retroperitoneum CPT copyright 2019 American Medical Association. All rights reserved. The codes documented in this report are preliminary and upon coder review may  be revised to meet current compliance requirements. Justice Britain, MD 07/26/2021 11:01:45 AM Number of Addenda: 0

## 2021-07-26 NOTE — Anesthesia Preprocedure Evaluation (Signed)
Anesthesia Evaluation  Patient identified by MRN, date of birth, ID band Patient awake    Reviewed: Allergy & Precautions, NPO status , Patient's Chart, lab work & pertinent test results  History of Anesthesia Complications Negative for: history of anesthetic complications  Airway Mallampati: II  TM Distance: >3 FB Neck ROM: Full    Dental  (+) Teeth Intact   Pulmonary neg pulmonary ROS,    Pulmonary exam normal        Cardiovascular hypertension, Normal cardiovascular exam     Neuro/Psych negative neurological ROS     GI/Hepatic negative GI ROS, Neg liver ROS, Pancreatic mass   Endo/Other  diabetes, Type 2, Oral Hypoglycemic Agents  Renal/GU CRFRenal disease  negative genitourinary   Musculoskeletal negative musculoskeletal ROS (+)   Abdominal   Peds  Hematology negative hematology ROS (+)   Anesthesia Other Findings   Reproductive/Obstetrics                             Anesthesia Physical Anesthesia Plan  ASA: 3  Anesthesia Plan: MAC   Post-op Pain Management:    Induction: Intravenous  PONV Risk Score and Plan: Propofol infusion, TIVA and Treatment may vary due to age or medical condition  Airway Management Planned: Natural Airway, Nasal Cannula and Simple Face Mask  Additional Equipment: None  Intra-op Plan:   Post-operative Plan:   Informed Consent: I have reviewed the patients History and Physical, chart, labs and discussed the procedure including the risks, benefits and alternatives for the proposed anesthesia with the patient or authorized representative who has indicated his/her understanding and acceptance.       Plan Discussed with:   Anesthesia Plan Comments:         Anesthesia Quick Evaluation

## 2021-07-26 NOTE — Anesthesia Postprocedure Evaluation (Signed)
Anesthesia Post Note  Patient: Zachary Reid  Procedure(s) Performed: ESOPHAGOGASTRODUODENOSCOPY (EGD) WITH PROPOFOL UPPER ENDOSCOPIC ULTRASOUND (EUS) RADIAL COLONOSCOPY WITH PROPOFOL FINE NEEDLE ASPIRATION (FNA) LINEAR BIOPSY POLYPECTOMY     Patient location during evaluation: Endoscopy Anesthesia Type: MAC Level of consciousness: awake and alert Pain management: pain level controlled Vital Signs Assessment: post-procedure vital signs reviewed and stable Respiratory status: spontaneous breathing, nonlabored ventilation and respiratory function stable Cardiovascular status: blood pressure returned to baseline and stable Postop Assessment: no apparent nausea or vomiting Anesthetic complications: no   No notable events documented.  Last Vitals:  Vitals:   07/26/21 1055 07/26/21 1110  BP: 101/70 114/80  Pulse: 75 72  Resp: 14 (!) 21  Temp:  (!) 36.3 C  SpO2: 100% 98%    Last Pain:  Vitals:   07/26/21 1110  TempSrc:   PainSc: 0-No pain                 Lidia Collum

## 2021-07-26 NOTE — Op Note (Signed)
[facility] Patient Name: Zachary Reid Procedure Date : 07/26/2021 MRN: [id number] Attending MD: Justice Britain , MD Date of Birth: 1956/08/19 CSN: [id number] Age: 65 Admit Type: Outpatient Procedure:                Colonoscopy Indications:              Screening for colorectal malignant neoplasm,                            Incidental - Suspected metastatic malignancy to                            liverdisease to the lungs Providers:                Justice Britain, MD Referring MD:             [name], [name] Medicines:                Monitored Anesthesia Care Complications:            No immediate complications. Estimated Blood Loss:     Estimated blood loss was minimal. Procedure:                Pre-Anesthesia Assessment:                           - Prior to the procedure, a History and Physical                            was performed, and patient medications and                            allergies were reviewed. The patient's tolerance of                            previous anesthesia was also reviewed. The risks                            and benefits of the procedure and the sedation                            options and risks were discussed with the patient.                            All questions were answered, and informed consent                            was obtained. Prior Anticoagulants: The patient has                            taken no previous anticoagulant or antiplatelet                            agents except for aspirin. ASA Grade Assessment:  III - A patient with severe systemic disease. After                            reviewing the risks and benefits, the patient was                            deemed in satisfactory condition to undergo the                            procedure.                           After obtaining informed consent, the colonoscope                            was passed under  direct vision. Throughout the                            procedure, the patient's blood pressure, pulse, and                            oxygen saturations were monitored continuously. The                            PCF-190TL ([id number]) Olympus colonoscope was                            introduced through the anus and advanced to the the                            cecum, identified by appendiceal orifice and                            ileocecal valve. The colonoscopy was performed                            without difficulty. The patient tolerated the                            procedure. The quality of the bowel preparation was                            adequate. The ileocecal valve, appendiceal orifice,                            and rectum were photographed. Scope In: 3:87:56 AM Scope Out: 10:29:26 AM Scope Withdrawal Time: 0 hours 26 minutes 23 seconds  Total Procedure Duration: 0 hours 33 minutes 7 seconds  Findings:      The digital rectal exam findings include hemorrhoids. Pertinent       negatives include no palpable rectal lesions.      A large amount of semi-liquid stool was found in the entire colon,       making visualization difficult. Lavage of the area was performed using       copious  amounts, resulting in clearance with adequate visualization.      A 3 mm polyp was found in the hepatic flexure. The polyp was sessile.       The polyp was removed with a cold snare. Resection and retrieval were       complete.      Multiple small-mouthed diverticula were found in the recto-sigmoid colon       and sigmoid colon.      Non-thrombosed external and internal hemorrhoids were found during       retroflexion, during perianal exam and during digital exam. There was       some mild oozing from an internal hemorrhoid found during the procedure.       The hemorrhoids were Grade III (internal hemorrhoids that prolapse but       require manual reduction). Pressure was placed at end of  case as well as       lidocaine jelly with abatement of bleeding. Impression:               - Hemorrhoids found on digital rectal exam.                           - Stool in the entire examined colon.                           - One 3 mm polyp at the hepatic flexure, removed                            with a cold snare. Resected and retrieved.                           - Diverticulosis in the recto-sigmoid colon and in                            the sigmoid colon.                           - Non-thrombosed external and internal hemorrhoids.                            Mild oozing noted from internal hemorrhoidal Recommendation:           - The patient will be observed post-procedure,                            until all discharge criteria are met.                           - Discharge patient to home.                           - Patient has a contact number available for                            emergencies. The signs and symptoms of potential                            delayed  complications were discussed with the                            patient. Return to normal activities tomorrow.                            Written discharge instructions were provided to the                            patient.                           - High fiber diet.                           - Use FiberCon 1-2 tablets PO daily.                           - Anusol suppositories nightly for 1 week and then                            every other night until prescription is complete.                            May use Preparation H in interim.                           - Await pathology results.                           - Follow up recommendation based on final pathology.                           - The findings and recommendations were discussed                            with the patient.                           - The findings and recommendations were discussed                            with the patient's  family. Procedure Code(s):        --- Professional ---                           [phone], Colonoscopy, flexible; with removal of                            tumor(s), polyp(s), or other lesion(s) by snare                            technique Diagnosis Code(s):        --- Professional ---  Z12.11, Encounter for screening for malignant                            neoplasm of colon                           K64.2, Third degree hemorrhoids                           K63.5, Polyp of colon                           K57.30, Diverticulosis of large intestine without                            perforation or abscess without bleeding CPT copyright 2019 American Medical Association. All rights reserved. The codes documented in this report are preliminary and upon coder review may  be revised to meet current compliance requirements. Justice Britain, MD 07/26/2021 11:13:19 AM Number of Addenda: 0

## 2021-07-26 NOTE — Transfer of Care (Signed)
Immediate Anesthesia Transfer of Care Note  Patient: Zachary Reid  Procedure(s) Performed: ESOPHAGOGASTRODUODENOSCOPY (EGD) WITH PROPOFOL UPPER ENDOSCOPIC ULTRASOUND (EUS) RADIAL COLONOSCOPY WITH PROPOFOL FINE NEEDLE ASPIRATION (FNA) LINEAR BIOPSY POLYPECTOMY  Patient Location: PACU  Anesthesia Type:MAC  Level of Consciousness: awake, alert  and oriented  Airway & Oxygen Therapy: Patient Spontanous Breathing and Patient connected to face mask oxygen  Post-op Assessment: Report given to RN and Post -op Vital signs reviewed and stable  Post vital signs: Reviewed and stable  Last Vitals:  Vitals Value Taken Time  BP 109/77 07/26/21 1039  Temp    Pulse 76 07/26/21 1041  Resp 22 07/26/21 1041  SpO2 100 % 07/26/21 1041  Vitals shown include unvalidated device data.  Last Pain:  Vitals:   07/26/21 0722  TempSrc: Oral  PainSc: 0-No pain         Complications: No notable events documented.

## 2021-07-27 ENCOUNTER — Inpatient Hospital Stay: Payer: 59 | Attending: Hematology | Admitting: Hematology

## 2021-07-27 ENCOUNTER — Other Ambulatory Visit: Payer: Self-pay

## 2021-07-27 ENCOUNTER — Encounter (HOSPITAL_COMMUNITY): Payer: Self-pay | Admitting: Gastroenterology

## 2021-07-27 VITALS — BP 141/85 | HR 117 | Temp 98.0°F | Resp 18 | Ht 71.0 in | Wt 160.5 lb

## 2021-07-27 DIAGNOSIS — E119 Type 2 diabetes mellitus without complications: Secondary | ICD-10-CM | POA: Insufficient documentation

## 2021-07-27 DIAGNOSIS — F419 Anxiety disorder, unspecified: Secondary | ICD-10-CM | POA: Diagnosis not present

## 2021-07-27 DIAGNOSIS — C250 Malignant neoplasm of head of pancreas: Secondary | ICD-10-CM | POA: Diagnosis not present

## 2021-07-27 DIAGNOSIS — R918 Other nonspecific abnormal finding of lung field: Secondary | ICD-10-CM | POA: Diagnosis present

## 2021-07-27 DIAGNOSIS — G4700 Insomnia, unspecified: Secondary | ICD-10-CM | POA: Diagnosis not present

## 2021-07-27 DIAGNOSIS — Z800 Family history of malignant neoplasm of digestive organs: Secondary | ICD-10-CM | POA: Insufficient documentation

## 2021-07-27 DIAGNOSIS — C259 Malignant neoplasm of pancreas, unspecified: Secondary | ICD-10-CM

## 2021-07-27 DIAGNOSIS — Z8042 Family history of malignant neoplasm of prostate: Secondary | ICD-10-CM | POA: Insufficient documentation

## 2021-07-27 DIAGNOSIS — N200 Calculus of kidney: Secondary | ICD-10-CM | POA: Insufficient documentation

## 2021-07-27 LAB — SURGICAL PATHOLOGY

## 2021-07-27 MED ORDER — ZOLPIDEM TARTRATE 10 MG PO TABS: 5.0000 mg | ORAL_TABLET | Freq: Every evening | ORAL | 0 refills | Status: DC | PRN

## 2021-07-27 NOTE — Progress Notes (Addendum)
Zachary Reid   Telephone:([phone]) [phone] Fax:(336) [phone]   Clinic Follow up Note   Patient Care Team: [name], MD as PCP - General (Internal Medicine) [name], NP as PCP - Hematology/Oncology (Nurse Practitioner) [name], MD (Internal Medicine) [name], MD as Consulting Physician (Orthopedic Surgery) [name], MD as Consulting Physician (Cardiology) [name], MD as Consulting Physician (Oncology) Royston Bake, RN as Oncology Nurse Navigator (Oncology)  Date of Service:  07/27/2021  CHIEF COMPLAINT: discuss scan and biopsy result   CURRENT THERAPY:  PENDING first line chemo   ASSESSMENT & PLAN:  Zachary Reid is a 65 y.o. male with   1. 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  He does have some nonspecific symptoms, including epigastric pain, chronic right flank pain, and a dry cough.  He has had significant weight loss in the past several years. 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  Liver lesions are likely benign cysts.  Lung lesions are not hypermetabolic but highly suspicious for metastasis. -He underwent EUS and biopsy yesterday, cytology showed adenocarcinoma.-He proceeded to colonoscopy and EUS on 07/26/21.  It demonstrated a solid-cystic mass in the genu-body of the pancreas, with endosonographic evidence of SMA abutment, T4N0. Cytology confirmed adenocarcinoma. -I also reviewed his gastric and colon biopsies results with pt, which were negative for malignancy. -I reviewed the natural history of metastatic pancreatic cancer. He unfortunately is not a candidate for surgery, and his cancer is not curable.  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   -The recommendation is to proceed with chemotherapy. I discussed the first-line chemotherapy options, especially FOLFIRINOX and gemcitabine with Abraxane.  The logistics of each regiment, clinical data, and potential side effects in details. I recommend FOLFIRINOX as first-line  --Chemotherapy consent: Side effects including but does not not limited to, fatigue, nausea, vomiting, diarrhea, hair loss, neuropathy, fluid retention, renal and kidney dysfunction, neutropenic fever, needed for blood transfusion, bleeding, were discussed with patient in great detail. He agrees to proceed with chemo but will think about which regimen to start.  -we also discussed the role of radiation, which not indicated in the metastatic setting.  However if he has drastic response to chemo and his lung metastasis resolves, we may use radiation therapy as consolidation therapy in future if he wants to come off chemo for a while.  -Patient wants a second opinion at [facility], I will refer to Dr. Leamon Arnt  2. Anxiety, Insomnia -anxiety began 2022. He was started on lexapro on 04/19/21 and wellbutrin on 06/05/21 but came off them about a month ago due to constipation. -reported new anxiety attacks since diagnosis. I prescribed Xanax for him on 07/12/21. He feels this is helpful. -he notes he still has insomnia. I prescribed Ambien today, which he has used successfully in the past.  3. Genetic Testing -he reports pancreatic cancer in his mother, prostate cancer in his father, ovarian in PGM, and lung in PGF (smoker). -he is interested in testing. They have a daughter and want to know for her sake as well. -We discussed the benefit of PARP inhibitor if his genetic testing shows positive BRCA1 or BRCA2 mutation  3. Left kidney stone, right flank pain, constipation -f/u urology for kidney stone -he reports constipation  since starting lexapro. Despite stopping the medication a month ago, he still has constipation. -I recommend he take a daily laxative and increase the dose as needed. I advised him to take milk of magnesium as needed if he still cannot go.  2. DM  -f/u with PCP     PLAN:  -referral to genetics -reschedule nutrition appointment -we will reach out to psychologist Dr. Michail Sermon to check on previous referral -proceed to chemo education next week -IR port placement -Referred to [facility] with Dr. Leamon Arnt for second opinion -He will call me when he is ready to proceed with chemotherapy, and which regimen he would like to start.  -I have request MMR on his cytology, and FO if we have adequate material      No problem-specific Assessment & Plan notes found for this encounter.   SUMMARY OF ONCOLOGIC HISTORY: Oncology History  Pancreatic adenocarcinoma ([place])  07/02/2021 Initial Diagnosis   Pancreatic adenocarcinoma ([place])   07/26/2021 Cancer Staging   Staging form: Exocrine Pancreas, AJCC 8th Edition - Clinical stage from 07/26/2021: Stage IV (cT4, cN0, cM1) - Signed by [name], MD on 07/28/2021 Stage prefix: Initial diagnosis Total positive nodes: 0      INTERVAL HISTORY:  Zachary Reid is here for a follow up of pancreatic cancer. He was last seen by me on 07/03/21 in consultation. He presents to the clinic accompanied by his wife. He reports feeling well; he tolerated EGD/EUS/colonoscopy well with no pain. He expressed frustration that no one reaches out to him with the results of scans or any other new information. He reports his main complaint is right-sided back pain. I explained I do not feel this is related to his cancer. He notes he missed his appointment with nutrition and would like to reschedule. He has also not been contacted by Dr. Michail Sermon regarding counseling.   All other systems were reviewed with the patient and are negative.  MEDICAL HISTORY:  Past Medical History:   Diagnosis Date   Abnormal EKG    DM (diabetes mellitus) ([place])    Hyperlipidemia    Hypogonadism male    IBS (irritable bowel syndrome)    Obesity    BMI 31   pancreatic ca 06/28/2021   Vitamin D deficiency     SURGICAL HISTORY: Past Surgical History:  Procedure Laterality Date   APPENDECTOMY     BIOPSY  07/26/2021   Procedure: BIOPSY;  Surgeon: Irving Copas., MD;  Location: [place];  Service: Gastroenterology;;   COLONOSCOPY WITH PROPOFOL N/A 07/26/2021   Procedure: COLONOSCOPY WITH PROPOFOL;  Surgeon: Irving Copas., MD;  Location: [place];  Service: Gastroenterology;  Laterality: N/A;   ESOPHAGOGASTRODUODENOSCOPY (EGD) WITH PROPOFOL N/A 07/26/2021   Procedure: ESOPHAGOGASTRODUODENOSCOPY (EGD) WITH PROPOFOL;  Surgeon: Rush Landmark Telford Nab., MD;  Location: [place];  Service: Gastroenterology;  Laterality: N/A;   EUS N/A 07/26/2021   Procedure: UPPER ENDOSCOPIC ULTRASOUND (EUS) RADIAL;  Surgeon: Irving Copas., MD;  Location: [place];  Service: Gastroenterology;  Laterality: N/A;   FINE NEEDLE ASPIRATION  07/26/2021   Procedure: FINE NEEDLE ASPIRATION (FNA) LINEAR;  Surgeon: Irving Copas., MD;  Location: [place];  Service: Gastroenterology;;   POLYPECTOMY  07/26/2021   Procedure: POLYPECTOMY;  Surgeon: Irving Copas., MD;  Location: [place];  Service: Gastroenterology;;   SHOULDER SURGERY Right     I have reviewed the social history and family  history with the patient and they are unchanged from previous note.  ALLERGIES:  is allergic to lipitor [atorvastatin].  MEDICATIONS:  Current Outpatient Medications  Medication Sig Dispense Refill   zolpidem (AMBIEN) 10 MG tablet Take 0.5-1 tablets (5-10 mg total) by mouth at bedtime as needed for sleep. 30 tablet 0   ALPRAZolam (XANAX) 0.25 MG tablet Take 1-2 tablets (0.25-0.5 mg total) by mouth 2 (two) times daily as needed for anxiety. 30 tablet 0   aspirin EC 81 MG  tablet Take 81 mg by mouth in the morning and at bedtime. Swallow whole.     buPROPion (WELLBUTRIN XL) 300 MG 24 hr tablet Take 1 tablet every Morning for Mood, Focus & Concentration (Patient not taking: No sig reported) 90 tablet 3   Cholecalciferol (VITAMIN D3) 125 MCG (5000 UT) CAPS Take 5,000 Units by mouth in the morning and at bedtime.     ciprofloxacin (CIPRO) 500 MG tablet Take 1 tablet (500 mg total) by mouth 2 (two) times daily for 5 days. 10 tablet 0   escitalopram (LEXAPRO) 20 MG tablet Take 1 tablet  Daily  for Mood & Chronic Anxiety (Patient not taking: No sig reported) 90 tablet 1   gabapentin (NEURONTIN) 600 MG tablet Take 600 mg by mouth 3 (three) times daily.     glipiZIDE (GLUCOTROL) 5 MG tablet TAKE 1 TABLET BY MOUTH 2 TO 3 TIMES /DAY WITH MEALS FOR DIABETES (Patient taking differently: Take 10 mg by mouth in the morning, at noon, and at bedtime.) 270 tablet 1   glucose blood test strip Use as instructed 100 each 12   hydrocortisone (ANUSOL-HC) 25 MG suppository Place 1 suppository (25 mg total) rectally at bedtime. QHS x 1-week and then Every Other night until prescription complete 12 suppository 0   hyoscyamine (LEVSIN SL) 0.125 MG SL tablet DISSOLVE ONE TABLET UNDER THE TONGUE 4 TIMES DAILY UP TO EVERY 4 HOURS AS NEEDED FOR NAUSEA, BLOATING, CRAMPING, OR DIARRHEA (Patient not taking: No sig reported) 100 tablet 0   metFORMIN (GLUCOPHAGE-XR) 500 MG 24 hr tablet Take 500 mg by mouth in the morning, at noon, and at bedtime.     ondansetron (ZOFRAN ODT) 4 MG disintegrating tablet Take 1 tablet (4 mg total) by mouth every 8 (eight) hours as needed for nausea or vomiting. 20 tablet 0   oxyCODONE (ROXICODONE) 5 MG immediate release tablet Take 1 tablet (5 mg total) by mouth every 6 (six) hours as needed for up to 6 doses for severe pain. (Patient not taking: No sig reported) 6 tablet 0   Red Yeast Rice 600 MG TABS Take 300 mg by mouth daily.     simvastatin (ZOCOR) 80 MG tablet Take 1  tablet (80 mg total) by mouth daily. (Patient not taking: No sig reported) 90 tablet 1   traZODone (DESYREL) 150 MG tablet Take  1/2 to 1 tablet  1 hour  before Bedtime for Sleep (Patient taking differently: Take 75 mg by mouth at bedtime. Take  1/2 to 1 tablet  1 hour  before Bedtime for Sleep) 90 tablet 1   No current facility-administered medications for this visit.    PHYSICAL EXAMINATION: ECOG PERFORMANCE STATUS: 1 - Symptomatic but completely ambulatory  Vitals:   07/27/21 1611  BP: (!) 141/85  Pulse: (!) 117  Resp: 18  Temp: 98 F (36.7 C)  SpO2: 97%   Wt Readings from Last 3 Encounters:  07/27/21 160 lb 8 oz (72.8 kg)  07/26/21 160 lb (  72.6 kg)  07/03/21 165 lb 12.8 oz (75.2 kg)     GENERAL:alert, no distress and comfortable SKIN: skin color normal, no rashes or significant lesions EYES: normal, Conjunctiva are pink and non-injected, sclera clear  NEURO: alert & oriented x 3 with fluent speech  LABORATORY DATA:  I have reviewed the data as listed CBC Latest Ref Rng & Units 07/03/2021 06/05/2021 03/05/2021  WBC 4.0 - 10.5 K/uL 5.9 5.2 5.4  Hemoglobin 13.0 - 17.0 g/dL 15.4 14.0 14.6  Hematocrit 39.0 - 52.0 % 43.1 41.8 43.6  Platelets 150 - 400 K/uL 166 149 182     CMP Latest Ref Rng & Units 07/03/2021 06/05/2021 03/05/2021  Glucose 70 - 99 mg/dL 268(H) 267(H) 255(H)  BUN 8 - 23 mg/dL _0 Creatinine 0.61 - 1.24 mg/dL 1.05 0.88 0.76  Sodium 135 - 145 mmol/L 139 139 140  Potassium 3.5 - 5.1 mmol/L 4.5 4.3 4.4  Chloride 98 - 111 mmol/L 103 102 104  CO2 22 - 32 mmol/L _1 Calcium 8.9 - 10.3 mg/dL 10.3 9.8 9.7  Total Protein 6.5 - 8.1 g/dL 8.0 6.9 6.9  Total Bilirubin 0.3 - 1.2 mg/dL 0.9 0.8 0.7  Alkaline Phos 38 - 126 U/L 82 - -  AST 15 - 41 U/L _2 ALT 0 - 44 U/L _3 RADIOGRAPHIC STUDIES: I have personally reviewed the radiological images as listed and agreed with the findings in the report. No results found.    Orders Placed This  Encounter  Procedures   Ambulatory referral to Genetics    Referral Priority:   Urgent    Referral Type:   Consultation    Referral Reason:   Specialty Services Required    Number of Visits Requested:   1    All questions were answered. The patient knows to call the clinic with any problems, questions or concerns. No barriers to learning was detected. The total time spent in the appointment was 70 minutes.    07/27/2021  I, Wilburn Mylar, am acting as scribe for [name], MD.   I have reviewed the above documentation for accuracy and completeness, and I agree with the above.

## 2021-07-28 ENCOUNTER — Encounter: Payer: Self-pay | Admitting: Hematology

## 2021-07-30 ENCOUNTER — Other Ambulatory Visit: Payer: Self-pay

## 2021-07-30 ENCOUNTER — Telehealth: Payer: Self-pay | Admitting: Hematology

## 2021-07-30 ENCOUNTER — Inpatient Hospital Stay: Payer: 59

## 2021-07-30 ENCOUNTER — Inpatient Hospital Stay (HOSPITAL_BASED_OUTPATIENT_CLINIC_OR_DEPARTMENT_OTHER): Payer: 59 | Admitting: Genetic Counselor

## 2021-07-30 ENCOUNTER — Encounter: Payer: Self-pay | Admitting: Gastroenterology

## 2021-07-30 DIAGNOSIS — Z8041 Family history of malignant neoplasm of ovary: Secondary | ICD-10-CM | POA: Diagnosis not present

## 2021-07-30 DIAGNOSIS — C259 Malignant neoplasm of pancreas, unspecified: Secondary | ICD-10-CM

## 2021-07-30 DIAGNOSIS — Z800 Family history of malignant neoplasm of digestive organs: Secondary | ICD-10-CM

## 2021-07-30 DIAGNOSIS — Z8042 Family history of malignant neoplasm of prostate: Secondary | ICD-10-CM

## 2021-07-30 LAB — GENETIC SCREENING ORDER

## 2021-07-30 NOTE — Progress Notes (Addendum)
REFERRING PROVIDER: [name], MD [place], [place] [address]  PRIMARY PROVIDER:  [name], MD  PRIMARY REASON FOR VISIT:  Encounter Diagnoses  Name Primary?   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 male, was seen for a [address] cancer genetics consultation at the request of Dr. Burr Medico due to a personal and family history of pancreatic cancer.  Zachary Reid presents to clinic today to discuss the possibility of a hereditary predisposition to cancer, to discuss genetic testing, and to further clarify his future cancer risks, as well as potential cancer risks for family members.   In August 2022, at the age of 69, Zachary Reid was diagnosed with pancreatic cancer. The treatment plan is pending.   CANCER HISTORY:  Oncology History  Pancreatic adenocarcinoma ([place])  07/02/2021 Initial Diagnosis   Pancreatic adenocarcinoma ([place])   07/26/2021 Cancer Staging   Staging form: Exocrine Pancreas, AJCC 8th Edition - Clinical stage from 07/26/2021: Stage IV (cT4, cN0, cM1) - Signed by [name], MD on 07/28/2021 Stage prefix: Initial diagnosis Total positive nodes: 0      RISK FACTORS:  Zachary Reid reports no history of elevated PSA levels.. He has no history of smoking and does not drink alcohol. His most recent colonoscopy was 07/2021 and 1 polyp was identified.   Past Medical History:  Diagnosis Date   Abnormal EKG    DM (diabetes mellitus) ([place])    Hyperlipidemia    Hypogonadism male    IBS (irritable bowel syndrome)    Obesity    BMI 31   pancreatic ca 06/28/2021   Vitamin D deficiency     Past Surgical History:  Procedure Laterality Date   APPENDECTOMY     BIOPSY  07/26/2021   Procedure: BIOPSY;  Surgeon: Irving Copas., MD;  Location: [facility];  Service: Gastroenterology;;    COLONOSCOPY WITH PROPOFOL N/A 07/26/2021   Procedure: COLONOSCOPY WITH PROPOFOL;  Surgeon: Irving Copas., MD;  Location: [facility];  Service: Gastroenterology;  Laterality: N/A;   ESOPHAGOGASTRODUODENOSCOPY (EGD) WITH PROPOFOL N/A 07/26/2021   Procedure: ESOPHAGOGASTRODUODENOSCOPY (EGD) WITH PROPOFOL;  Surgeon: Rush Landmark Telford Nab., MD;  Location: [facility];  Service: Gastroenterology;  Laterality: N/A;   EUS N/A 07/26/2021   Procedure: UPPER ENDOSCOPIC ULTRASOUND (EUS) RADIAL;  Surgeon: Irving Copas., MD;  Location: [facility];  Service: Gastroenterology;  Laterality: N/A;   FINE NEEDLE ASPIRATION  07/26/2021   Procedure: FINE NEEDLE ASPIRATION (FNA) LINEAR;  Surgeon: Irving Copas., MD;  Location: [facility];  Service: Gastroenterology;;   POLYPECTOMY  07/26/2021   Procedure: POLYPECTOMY;  Surgeon: Irving Copas., MD;  Location: [facility] ENDOSCOPY;  Service: Gastroenterology;;   SHOULDER SURGERY Right     Social History   Socioeconomic History   Marital status: Married    Spouse name: Not on file   Number of children: 1   Years of education: Not on file   Highest education level: Not on file  Occupational History   Not on file  Tobacco Use   Smoking status: Never   Smokeless tobacco: Never  Substance and Sexual Activity   Alcohol use: No   Drug use: No   Sexual activity: Never  Other Topics Concern   Not on file  Social History Narrative   Not on file  Social Determinants of Health   Financial Resource Strain: Not on file  Food Insecurity: Not on file  Transportation Needs: Not on file  Physical Activity: Not on file  Stress: Not on file  Social Connections: Not on file     FAMILY HISTORY:  We obtained a detailed, 4-generation family history.  Significant diagnoses are listed below:  Family History  Problem Relation Age of Onset   Pancreatic cancer Mother 8   Prostate cancer Father 24       Prostate   Ovarian cancer Paternal  Grandmother 33   Lung cancer Paternal Grandfather        Cigar smoker       Zachary Reid mother had a personal history of pancreatic cancer diagnosed at 54, she died at 73. His father has a personal history of prostate cancer diagnosed at 2, he had radiation seeds as his treatment and died at 108 due to a brain infection. His paternal grandmother was diagnosed with ovarian cancer at 28, she died at 14 due to ovarian cancer metastasis. His paternal grandfather was diagnosed with lung cancer, he smoked cigars, died at 34.  Zachary Reid is unaware of previous family history of genetic testing for hereditary cancer risks. There no reported Ashkenazi Jewish ancestry.   GENETIC COUNSELING ASSESSMENT: Zachary Reid is a 65 y.o. male with a personal and family history of cancer which is somewhat suggestive of a hereditary predisposition to cancer. We, therefore, discussed and recommended the following at today's visit.   DISCUSSION: We discussed that 5 - 10% of cancer is hereditary, with most cases of pancreatic cancer associated with BRCA1/2.  There are other genes that can be associated with hereditary pancreatic, prostate, and ovarian cancer syndromes. We discussed that testing is beneficial for several reasons including knowing how to follow individuals after completing their treatment, identifying whether potential treatment options would be beneficial, and understanding if other family members could be at risk for cancer and allowing them to undergo genetic testing.   We reviewed the characteristics, features and inheritance patterns of hereditary cancer syndromes. We also discussed genetic testing, including the appropriate family members to test, the process of testing, insurance coverage and turn-around-time for results. We discussed the implications of a negative, positive, carrier and/or variant of uncertain significant result. We recommended Zachary Reid pursue genetic testing for a panel that  includes genes associated with pancreatic, ovarian, and prostate cancer.   Zachary Reid  was offered a common hereditary cancer panel (47 genes) and an expanded pan-cancer panel (84 genes). Zachary Reid was informed of the benefits and limitations of each panel, including that expanded pan-cancer panels contain genes that do not have clear management guidelines at this point in time.  We also discussed that as the number of genes included on a panel increases, the chances of variants of uncertain significance increases.  After considering the benefits and limitations of each gene panel, Zachary Reid elected to have [facility] BRCAplus with reflex to CancerNext-Expanded Panel (77 genes). The CancerNext-Expanded gene panel offered by [facility] and includes sequencing, rearrangement, and RNA analysis for the following 77 genes: AIP, ALK, APC, ATM, AXIN2, BAP1, BARD1, BLM, BMPR1A, BRCA1, BRCA2, BRIP1, CDC73, CDH1, CDK4, CDKN1B, CDKN2A, CHEK2, CTNNA1, DICER1, FANCC, FH, FLCN, GALNT12, KIF1B, LZTR1, MAX, MEN1, MET, MLH1, MSH2, MSH3, MSH6, MUTYH, NBN, NF1, NF2, NTHL1, PALB2, PHOX2B, PMS2, POT1, PRKAR1A, PTCH1, PTEN, RAD51C, RAD51D, RB1, RECQL, RET, SDHA, SDHAF2, SDHB, SDHC, SDHD, SMAD4, SMARCA4, SMARCB1, SMARCE1, STK11, SUFU, TMEM127, TP53, TSC1, TSC2,  VHL and XRCC2 (sequencing and deletion/duplication); EGFR, EGLN1, HOXB13, KIT, MITF, PDGFRA, POLD1, and POLE (sequencing only); EPCAM and GREM1 (deletion/duplication only).    Based on Zachary Reid personal and family history of cancer, he meets medical criteria for genetic testing. Despite that he meets criteria, he may still have an out of pocket cost. We discussed that if his out of pocket cost for testing is over $100, the laboratory will call and confirm whether he wants to proceed with testing.  If the out of pocket cost of testing is less than $100 he will be billed by the genetic testing laboratory.   PLAN: After considering the risks, benefits, and  limitations, Zachary Reid provided informed consent to pursue genetic testing and the blood sample was sent to [facility] for analysis of the BRCAplus panel with reflex to CancerNext-Expanded+RNA. Results should be available within approximately 1-2 weeks' time for BRCA1/2 and 2-3 weeks for the expanded panel, at which point they will be disclosed by telephone to Zachary Reid, as will any additional recommendations warranted by these results. Zachary Reid will receive a summary of his genetic counseling visit and a copy of his results once available. This information will also be available in Epic.    Zachary Reid questions were answered to his satisfaction today. Our contact information was provided should additional questions or concerns arise. Thank you for the referral and allowing [place] to share in the care of your patient.   Lucille Passy, MS, [facility] Genetic Counselor [email or web address] [phone]) [phone]  The patient was seen for a total of 30 minutes in face-to-face genetic counseling.  The patient brought his wife, Zachary Reid. Drs. Magrinat, Lindi Adie and/or Burr Medico were available to discuss this case as needed.  _______________________________________________________________________ For Office Staff:  Number of people involved in session: 2 Was an Intern/ student involved with case: no

## 2021-07-30 NOTE — Telephone Encounter (Signed)
Scheduled per sch msg. Called and left msg  

## 2021-07-30 NOTE — Telephone Encounter (Signed)
Scheduled urgent genetics per 9/10 referral. Pt is aware of appt date and time.

## 2021-07-30 NOTE — Telephone Encounter (Signed)
Scheduled follow-up appointment per 9/9 los. Patient is aware.

## 2021-07-30 NOTE — Progress Notes (Signed)
I spoke with Mr Zachary Reid regarding Dr Ernestina Penna e-mail with Dr Leamon Arnt at [facility].  I let Mr Zachary Reid know that [facility] does not not have research study for first line treatment of pancreatic cancer.  Dr Leamon Arnt did offer a second opinion consult with one of his colleagues at [facility]  Mr Zachary Reid is agreeable.  This am I faxed his demographics and insurance information to Carolanne Grumbling at [place].  Mr Zachary Reid and I discussed differences between Folfirinox and Abraxane/Gemzar. I reviewed his upcoming appts. All questions were answered.  He verbalized understanding.

## 2021-07-31 ENCOUNTER — Inpatient Hospital Stay: Payer: 59 | Admitting: Hematology

## 2021-07-31 LAB — CYTOLOGY - NON PAP

## 2021-08-01 ENCOUNTER — Other Ambulatory Visit: Payer: Self-pay

## 2021-08-01 ENCOUNTER — Other Ambulatory Visit: Payer: Self-pay | Admitting: Hematology

## 2021-08-01 DIAGNOSIS — R918 Other nonspecific abnormal finding of lung field: Secondary | ICD-10-CM

## 2021-08-01 DIAGNOSIS — C259 Malignant neoplasm of pancreas, unspecified: Secondary | ICD-10-CM

## 2021-08-01 MED ORDER — LIDOCAINE-PRILOCAINE 2.5-2.5 % EX CREA: TOPICAL_CREAM | CUTANEOUS | 3 refills | Status: DC

## 2021-08-01 MED ORDER — ONDANSETRON HCL 8 MG PO TABS
8.0000 mg | ORAL_TABLET | Freq: Two times a day (BID) | ORAL | 1 refills | Status: DC | PRN
Start: 2021-08-01 — End: 2022-07-22

## 2021-08-01 NOTE — Progress Notes (Signed)
START ON PATHWAY REGIMEN - Pancreatic Adenocarcinoma     A cycle is every 14 days:     Oxaliplatin      Leucovorin      Irinotecan      Fluorouracil   **Always confirm dose/schedule in your pharmacy ordering system**  Patient Characteristics: Metastatic Disease, First Line, PS = 0,1, BRCA1/2 and PALB2  Mutation Absent/Unknown Therapeutic Status: Metastatic Disease Line of Therapy: First Line ECOG Performance Status: 0 BRCA1/2 Mutation Status: Awaiting Test Results PALB2 Mutation Status: Awaiting Test Results Intent of Therapy: Non-Curative / Palliative Intent, Discussed with Patient

## 2021-08-01 NOTE — Progress Notes (Signed)
Mr Eckles called.  He decided to get treated with Folfirinox.  He is also requesting a referral to [facility] Dr Shirleen Schirmer for second opinion and consideration for research study.  Referral, ov note, scan reports, pathology reports, demographic and insurance information faxed to [facility] attn: Precious Bard at [phone]. Mr Streets states he is no longer interested in going to [place].  Dr Burr Medico aware of the above

## 2021-08-02 ENCOUNTER — Telehealth: Payer: Self-pay

## 2021-08-02 ENCOUNTER — Encounter: Payer: Self-pay | Admitting: Hematology

## 2021-08-02 ENCOUNTER — Telehealth: Payer: Self-pay | Admitting: Hematology

## 2021-08-02 NOTE — Telephone Encounter (Signed)
Notified Patient of Prior Authorization approval for Lidocaine-Prilocaine 2.5% Cream. Medication is authorized from 08/02/2021 through 08/02/2022

## 2021-08-02 NOTE — Telephone Encounter (Signed)
Scheduled per sch msg. Called and left msg  

## 2021-08-02 NOTE — Telephone Encounter (Signed)
Attempted to submit Prior Authorization for Lidocaine-Prilocaine 2.5% Cream via Covermymeds and received rejection from [employer] who processes prescription benefits. [employer] at [phone] and initiated Prior Authorization by phone. Case # [id number]. Notified Mr. Giacomelli of Prior Authorization initiation and of processing time of 7 to 14 days per [employer]. Understanding verbalized. Mr. Stavely inquired about referral request to Dr. Shirleen Schirmer at [facility]. Informed Mr. Mcclarin that referral, office visit notes, scan reports, pathology reports, demographic information, and insurance information had been forwarded to [place] [facility] [phone]) by his Nurse Navigator. No further needs verbalized at this time.

## 2021-08-03 ENCOUNTER — Other Ambulatory Visit: Payer: Self-pay

## 2021-08-03 ENCOUNTER — Inpatient Hospital Stay: Payer: 59

## 2021-08-03 ENCOUNTER — Other Ambulatory Visit: Payer: Self-pay | Admitting: Hematology

## 2021-08-03 DIAGNOSIS — C259 Malignant neoplasm of pancreas, unspecified: Secondary | ICD-10-CM

## 2021-08-03 MED ORDER — PROCHLORPERAZINE MALEATE 10 MG PO TABS: 10.0000 mg | ORAL_TABLET | Freq: Four times a day (QID) | ORAL | 1 refills | Status: DC | PRN

## 2021-08-07 ENCOUNTER — Inpatient Hospital Stay: Payer: 59 | Admitting: Nutrition

## 2021-08-07 ENCOUNTER — Other Ambulatory Visit: Payer: Self-pay

## 2021-08-07 ENCOUNTER — Encounter: Payer: Self-pay | Admitting: Hematology

## 2021-08-07 NOTE — Progress Notes (Signed)
Nutrition Assessment   Reason for Assessment: Provider request   ASSESSMENT: 65 year old male with newly diagnosed pancreatic cancer. He is scheduled to receive first cycle FOLFIRINOX 9/26  Past medical history includes DM, HLD, IBS, obesity, Vit D deficiency   Met with patient in clinic. He reports decreased appetite which he relates to stress/worry. Patient states appetite was great over the weekend while at a [employer] football game, recalls eating 3 good meals a day. Patient reports appetite decreased once returning home. 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 Patient reports he started noticing he was not eating as much at meal times and loss of weight over the last few months. He associated this to DM medications. Patient reports it was not until recent hospitalization for kidney stone that cancer was found. Patient reports he has been craving spaghetti recently and has eaten this 3 times this week. His wife is a "clean eater" and would like for him to eat this way as well during treatment. Patient reports some constipation. He denies nausea, vomiting, diarrhea.    Nutrition Focused Physical Exam: deferred   Medications: xanax, wellbutrin, Vit D3, glucophage, zofran, zocar    Labs: 8/16 - Glucose 268   Anthropometrics: Weights have decreased 38 lbs (19%) over the last 8 months which is significant for time frame.   Height: 5'11" Weight: 160.5 lb (9/9) UBW: 198 lb (12/05/20) BMI: 22.39  NUTRITION DIAGNOSIS: Unintentional weight loss related to newly diagnosed pancreatic cancer as evidenced by reported decreased appetite and 19% (38 lb) decrease from usual weight in 8 months which is significant.    INTERVENTION:  Educated on importance of adequate calories and protein to maintain strength, weights, nutrition Encouraged small frequent meals and snacks - high calorie, high protein diet handout and snack ideas  provided Discussed strategies for poor appetite and improving intake - packing snack bags, setting alarm on phone as meal time reminders, ways to add calories and protein to foods Discussed cold sensitivity side effects of chemotherapy - handout and warm supplement ideas provided Recommended drinking 2 Ensure Plus/equivalent daily - coupons provided Educated on services available at [facility], pt interested in massage as well as connection with chaplain - referrals made Contact information provided   MONITORING, EVALUATION, GOAL: Patient will tolerate increased calories and protein to promote weight stability/gain   Next Visit: Monday October 11 during infusion with Desmond Lope

## 2021-08-08 ENCOUNTER — Other Ambulatory Visit: Payer: Self-pay | Admitting: Hematology

## 2021-08-08 ENCOUNTER — Encounter: Payer: Self-pay | Admitting: Hematology

## 2021-08-08 MED ORDER — ALPRAZOLAM 0.25 MG PO TABS: 0.2500 mg | ORAL_TABLET | Freq: Two times a day (BID) | ORAL | 0 refills | Status: DC | PRN

## 2021-08-08 NOTE — Progress Notes (Signed)
Shanon Brow called requesting a refill for xanax. He states this works well for his anxiety.  He has 6 tablets

## 2021-08-08 NOTE — Progress Notes (Signed)
Pharmacist Chemotherapy Monitoring - Initial Assessment    Anticipated start date: 08/13/21   The following has been reviewed per standard work regarding the patient's treatment regimen: The patient's diagnosis, treatment plan and drug doses, and organ/hematologic function Lab orders and baseline tests specific to treatment regimen  The treatment plan start date, drug sequencing, and pre-medications Prior authorization status  Patient's documented medication list, including drug-drug interaction screen and prescriptions for anti-emetics and supportive care specific to the treatment regimen The drug concentrations, fluid compatibility, administration routes, and timing of the medications to be used The patient's access for treatment and lifetime cumulative dose history, if applicable  The patient's medication allergies and previous infusion related reactions, if applicable   Changes made to treatment plan:  N/A  Follow up needed:  Pending authorization for treatment     Kennith Center, Pharm.D., CPP 08/08/2021@2 :09 PM

## 2021-08-09 ENCOUNTER — Other Ambulatory Visit: Payer: Self-pay | Admitting: Student

## 2021-08-10 ENCOUNTER — Other Ambulatory Visit: Payer: Self-pay

## 2021-08-10 ENCOUNTER — Ambulatory Visit (HOSPITAL_COMMUNITY)
Admission: RE | Admit: 2021-08-10 | Discharge: 2021-08-10 | Disposition: A | Discharge status: Home / Self Care | Payer: 59 | Source: Ambulatory Visit | Attending: Hematology | Admitting: Hematology

## 2021-08-10 DIAGNOSIS — Z888 Allergy status to other drugs, medicaments and biological substances status: Secondary | ICD-10-CM | POA: Insufficient documentation

## 2021-08-10 DIAGNOSIS — Z79899 Other long term (current) drug therapy: Secondary | ICD-10-CM | POA: Diagnosis not present

## 2021-08-10 DIAGNOSIS — Z95828 Presence of other vascular implants and grafts: Secondary | ICD-10-CM

## 2021-08-10 DIAGNOSIS — Z7984 Long term (current) use of oral hypoglycemic drugs: Secondary | ICD-10-CM | POA: Insufficient documentation

## 2021-08-10 DIAGNOSIS — Z7982 Long term (current) use of aspirin: Secondary | ICD-10-CM | POA: Insufficient documentation

## 2021-08-10 DIAGNOSIS — C259 Malignant neoplasm of pancreas, unspecified: Secondary | ICD-10-CM | POA: Diagnosis present

## 2021-08-10 HISTORY — PX: IR IMAGING GUIDED PORT INSERTION: IMG5740

## 2021-08-10 HISTORY — DX: Presence of other vascular implants and grafts: Z95.828

## 2021-08-10 LAB — GLUCOSE, CAPILLARY
Glucose-Capillary: 145 mg/dL — ABNORMAL HIGH (ref 70–99)
Glucose-Capillary: 121 mg/dL — ABNORMAL HIGH (ref 70–99)

## 2021-08-10 MED ORDER — SODIUM CHLORIDE 0.9 % IV SOLN: INTRAVENOUS | Status: DC

## 2021-08-10 MED ORDER — LIDOCAINE-EPINEPHRINE 1 %-1:100000 IJ SOLN
INTRAMUSCULAR | Status: AC
  Filled 2021-08-10: qty 1

## 2021-08-10 MED ORDER — HEPARIN SOD (PORK) LOCK FLUSH 100 UNIT/ML IV SOLN
INTRAVENOUS | Status: AC
  Administered 2021-08-10: 5 [IU]
  Filled 2021-08-10: qty 5

## 2021-08-10 MED ORDER — MIDAZOLAM HCL 2 MG/2ML IJ SOLN
INTRAMUSCULAR | Status: AC
  Filled 2021-08-10: qty 4

## 2021-08-10 MED ORDER — LIDOCAINE HCL 1 % IJ SOLN
INTRAMUSCULAR | Status: DC | PRN
  Administered 2021-08-10 (×2): 20 mL via INTRADERMAL

## 2021-08-10 MED ORDER — MIDAZOLAM HCL 2 MG/2ML IJ SOLN
INTRAMUSCULAR | Status: DC | PRN
  Administered 2021-08-10: 1 mg via INTRAVENOUS
  Administered 2021-08-10 (×2): .5 mg via INTRAVENOUS

## 2021-08-10 MED ORDER — FENTANYL CITRATE (PF) 100 MCG/2ML IJ SOLN
INTRAMUSCULAR | Status: DC | PRN
  Administered 2021-08-10: 25 ug via INTRAVENOUS
  Administered 2021-08-10: 50 ug via INTRAVENOUS
  Administered 2021-08-10: 25 ug via INTRAVENOUS

## 2021-08-10 MED ORDER — FENTANYL CITRATE (PF) 100 MCG/2ML IJ SOLN
INTRAMUSCULAR | Status: AC
  Filled 2021-08-10: qty 4

## 2021-08-10 NOTE — Procedures (Signed)
Interventional Radiology Procedure Note ° °Procedure: Single Lumen Power Port Placement   ° °Access:  Right internal jugular vein ° °Findings: Catheter tip positioned at cavoatrial junction. Port is ready for immediate use.  ° °Complications: None ° °EBL: < 10 mL ° °Recommendations:  °- Ok to shower in 24 hours °- Do not submerge for 7 days °- Routine line care  ° ° °Kaleea Penner, MD ° ° ° °

## 2021-08-10 NOTE — H&P (Signed)
Chief Complaint: Patient was seen in consultation today for image guided port-a-catheter insertion  Referring Physician(s): Feng,Yan  Supervising Physician: Ruthann Cancer  Patient Status: Zachary Reid - Out-pt  History of Present Illness: Zachary Reid is a 65 y.o. male with diagnosis of pancreatic cancer found incidentally on CT when evaluating kidney stone.  He reports nonspecific symptoms, including epigastric pain, chronic right flank pain, and a dry cough.  He has had significant weight loss in the past several years.  He presents today for port-a-catheter insertion in IR.    Past Medical History:  Diagnosis Date   Abnormal EKG    DM (diabetes mellitus) ([place])    Hyperlipidemia    Hypogonadism male    IBS (irritable bowel syndrome)    Obesity    BMI 31   pancreatic ca 06/28/2021   Vitamin D deficiency     Past Surgical History:  Procedure Laterality Date   APPENDECTOMY     BIOPSY  07/26/2021   Procedure: BIOPSY;  Surgeon: Irving Copas., MD;  Location: [facility];  Service: Gastroenterology;;   COLONOSCOPY WITH PROPOFOL N/A 07/26/2021   Procedure: COLONOSCOPY WITH PROPOFOL;  Surgeon: Irving Copas., MD;  Location: [facility];  Service: Gastroenterology;  Laterality: N/A;   ESOPHAGOGASTRODUODENOSCOPY (EGD) WITH PROPOFOL N/A 07/26/2021   Procedure: ESOPHAGOGASTRODUODENOSCOPY (EGD) WITH PROPOFOL;  Surgeon: Rush Landmark Telford Nab., MD;  Location: [facility];  Service: Gastroenterology;  Laterality: N/A;   EUS N/A 07/26/2021   Procedure: UPPER ENDOSCOPIC ULTRASOUND (EUS) RADIAL;  Surgeon: Irving Copas., MD;  Location: [facility];  Service: Gastroenterology;  Laterality: N/A;   FINE NEEDLE ASPIRATION  07/26/2021   Procedure: FINE NEEDLE ASPIRATION (FNA) LINEAR;  Surgeon: Irving Copas., MD;  Location: [facility];  Service: Gastroenterology;;   POLYPECTOMY  07/26/2021   Procedure: POLYPECTOMY;  Surgeon: Irving Copas., MD;  Location: [facility];  Service: Gastroenterology;;   SHOULDER SURGERY Right     Allergies: Lipitor [atorvastatin]  Medications: Prior to Admission medications   Medication Sig Start Date End Date Taking? Authorizing Provider  ALPRAZolam (XANAX) 0.25 MG tablet Take 1-2 tablets (0.25-0.5 mg total) by mouth 2 (two) times daily as needed for anxiety. 08/08/21  Yes [name], MD  aspirin EC 81 MG tablet Take 81 mg by mouth in the morning and at bedtime. Swallow whole.   Yes [provider]  Cholecalciferol (VITAMIN D3) 125 MCG (5000 UT) CAPS Take 5,000 Units by mouth in the morning and at bedtime.   Yes [provider]  gabapentin (NEURONTIN) 600 MG tablet Take 600 mg by mouth 3 (three) times daily.   Yes [provider]  glipiZIDE (GLUCOTROL) 5 MG tablet TAKE 1 TABLET BY MOUTH 2 TO 3 TIMES /DAY WITH MEALS FOR DIABETES Patient taking differently: Take 10 mg by mouth in the morning, at noon, and at bedtime. 06/25/21  Yes Magda Bernheim, NP  metFORMIN (GLUCOPHAGE-XR) 500 MG 24 hr tablet Take 500 mg by mouth in the morning, at noon, and at bedtime.   Yes [provider]  zolpidem (AMBIEN) 10 MG tablet Take 0.5-1 tablets (5-10 mg total) by mouth at bedtime as needed for sleep. 07/27/21 08/26/21 Yes [name], MD  buPROPion (WELLBUTRIN XL) 300 MG 24 hr tablet Take 1 tablet every Morning for Mood, Focus & Concentration Patient not taking: No sig reported 06/05/21   [name], MD  escitalopram (LEXAPRO) 20 MG tablet Take 1 tablet  Daily  for Mood & Chronic Anxiety Patient not taking:  No sig reported 04/19/21   [name], MD  glucose blood test strip Use as instructed 12/19/17   Liane Comber, NP  hydrocortisone (ANUSOL-HC) 25 MG suppository Place 1 suppository (25 mg total) rectally at bedtime. QHS x 1-week and then Every Other night until prescription complete 07/26/21 07/26/22  Mansouraty, Telford Nab., MD  hyoscyamine (LEVSIN SL) 0.125 MG SL tablet DISSOLVE ONE TABLET UNDER THE  TONGUE 4 TIMES DAILY UP TO EVERY 4 HOURS AS NEEDED FOR NAUSEA, BLOATING, CRAMPING, OR DIARRHEA Patient not taking: No sig reported 12/18/17   Liane Comber, NP  lidocaine-prilocaine (EMLA) cream Apply to affected area once 08/01/21   [name], MD  ondansetron (ZOFRAN ODT) 4 MG disintegrating tablet Take 1 tablet (4 mg total) by mouth every 8 (eight) hours as needed for nausea or vomiting. 06/09/21   Arnaldo Natal, MD  ondansetron (ZOFRAN) 8 MG tablet Take 1 tablet (8 mg total) by mouth 2 (two) times daily as needed. Start on day 3 after chemotherapy. 08/01/21   [name], MD  oxyCODONE (ROXICODONE) 5 MG immediate release tablet Take 1 tablet (5 mg total) by mouth every 6 (six) hours as needed for up to 6 doses for severe pain. Patient not taking: No sig reported 06/09/21   Arnaldo Natal, MD  prochlorperazine (COMPAZINE) 10 MG tablet Take 1 tablet (10 mg total) by mouth every 6 (six) hours as needed (Nausea or vomiting). 08/03/21   [name], MD  Red Yeast Rice 600 MG TABS Take 300 mg by mouth daily.    [provider]  simvastatin (ZOCOR) 80 MG tablet Take 1 tablet (80 mg total) by mouth daily. Patient not taking: No sig reported 03/05/21   Liane Comber, NP  traZODone (DESYREL) 150 MG tablet Take  1/2 to 1 tablet  1 hour  before Bedtime for Sleep Patient taking differently: Take 75 mg by mouth at bedtime. Take  1/2 to 1 tablet  1 hour  before Bedtime for Sleep 04/19/21   [name], MD     Family History  Problem Relation Age of Onset   Pancreatic cancer Mother 66   Prostate cancer Father 42       Prostate   Diabetes Father    Hypertension Father    Drug abuse Brother    Kidney Stones Brother    Ovarian cancer Paternal Grandmother 49   Lung cancer Paternal Grandfather        Cigar smoker   Colon cancer Neg Hx    Colon polyps Neg Hx    Esophageal cancer Neg Hx    Stomach cancer Neg Hx    Rectal cancer Neg Hx     Social History   Socioeconomic History   Marital  status: Married    Spouse name: Not on file   Number of children: 1   Years of education: Not on file   Highest education level: Not on file  Occupational History   Not on file  Tobacco Use   Smoking status: Never   Smokeless tobacco: Never  Substance and Sexual Activity   Alcohol use: No   Drug use: No   Sexual activity: Never  Other Topics Concern   Not on file  Social History Narrative   Not on file   Social Determinants of Health   Financial Resource Strain: Not on file  Food Insecurity: Not on file  Transportation Needs: Not on file  Physical Activity: Not on file  Stress: Not on file  Social  Connections: Not on file   Review of Systems: A 12 point ROS discussed and pertinent positives are indicated in the HPI above.  All other systems are negative.  Review of Systems  Constitutional:  Negative for activity change and appetite change.  Respiratory: Negative.    Cardiovascular: Negative.   Gastrointestinal:  Positive for constipation.  Genitourinary: Negative.   Musculoskeletal: Negative.   Skin: Negative.   Neurological: Negative.   Psychiatric/Behavioral: Negative.     Vital Signs: BP 118/75   Pulse 70   Temp 98.1 F (36.7 C) (Oral)   Resp 15   Ht 5\' 11"  (1.803 m)   Wt 158 lb (71.7 kg)   SpO2 99%   BMI 22.04 kg/m   Physical Exam Constitutional:      General: He is not in acute distress.    Appearance: Normal appearance. He is not ill-appearing or toxic-appearing.  HENT:     Head: Normocephalic and atraumatic.     Mouth/Throat:     Mouth: Mucous membranes are moist.     Pharynx: Oropharynx is clear.  Cardiovascular:     Rate and Rhythm: Normal rate and regular rhythm.  Pulmonary:     Effort: Pulmonary effort is normal. No respiratory distress.  Chest:     Chest wall: No tenderness.  Abdominal:     General: Abdomen is flat.     Palpations: Abdomen is soft.  Skin:    General: Skin is warm and dry.     Capillary Refill: Capillary refill takes  less than 2 seconds.  Neurological:     General: No focal deficit present.     Mental Status: He is alert and oriented to person, place, and time.  Psychiatric:        Mood and Affect: Mood normal.        Behavior: Behavior normal.        Thought Content: Thought content normal.        Judgment: Judgment normal.    Imaging: NM PET Image Initial (PI) Skull Base To Thigh  Result Date: 07/20/2021 CLINICAL DATA:  Initial treatment strategy for lung nodule in a 65 year old male. EXAM: NUCLEAR MEDICINE PET SKULL BASE TO THIGH TECHNIQUE: 8.0 mCi F-18 FDG was injected intravenously. Full-ring PET imaging was performed from the skull base to thigh after the radiotracer. CT data was obtained and used for attenuation correction and anatomic localization. Fasting blood glucose: 121 mg/dl COMPARISON:  CT of the chest from July 02, 2021 and MRI of the abdomen from July 02, 2021. FINDINGS: Mediastinal blood pool activity: SUV max 1.67 Liver activity: SUV max NA NECK: No hypermetabolic lymph nodes in the neck. Incidental CT findings: none CHEST: Innumerable pulmonary nodules compatible with pulmonary metastatic disease though with very small size, most with cavitary characteristics. Larger lesions less than a cm or only slightly greater than a cm and cavitary. SUV max in the range of less than one and only slightly greater than 1 on lesions assessed on the PET portion of the evaluation. Incidental CT findings: Scattered aortic atherosclerosis without aortic dilation. ABDOMEN/PELVIS: Hepatic cysts. 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 There  may also be involvement of the celiac trifurcation, a finding that is difficult to assess due to the motion limited nature of the previous study. Presence of vascular involvement is supported by a very large gastrocolic trunk and the indistinct appearance of the fat about vasculature at the level of the porta splenic confluence (image 114/4) no sign of solid organ involvement. No adenopathy in the abdomen or in the pelvis. Incidental CT findings: Nephrolithiasis in the upper pole the LEFT kidney proximally 7 mm calculus. Scattered aortic atherosclerosis. No acute gastrointestinal findings. No signs of ascites. SKELETON: No focal hypermetabolic activity to suggest skeletal metastasis. Incidental CT findings: none IMPRESSION: Signs of pancreatic cancer with metastatic disease. Tumor about the neck and body of the pancreas associated with marked atrophy of pancreatic parenchyma likely locally advanced in addition to metastatic based on findings described above. CT pancreatic protocol could be considered if this would alter management for further evaluation. Endoscopic biopsy may be helpful as well. Pulmonary nodules likely too small for percutaneous sampling. Aortic Atherosclerosis (ICD10-I70.0). Electronically Signed   By: Zetta Bills M.D.   On: 07/20/2021 12:57    Labs:  CBC: Recent Labs    12/05/20 1404 03/05/21 0915 06/05/21 1130 07/03/21 1231  WBC 7.7 5.4 5.2 5.9  HGB 14.3 14.6 14.0 15.4  HCT 41.6 43.6 41.8 43.1  PLT 189 182 149 166    COAGS: No results for input(s): INR, APTT in the last 8760 hours.  BMP: Recent Labs    12/05/20 1404 03/05/21 0915 06/05/21 1130 07/03/21 1231  NA 140 140 139 139  K 4.2 4.4 4.3 4.5  CL 101 104 102 103  CO2 30 26 29 26   GLUCOSE 245* 255* 267* 268*  BUN 19 16 14 12   CALCIUM 10.6* 9.7 9.8 10.3  CREATININE 1.13 0.76 0.88 1.05  GFRNONAA 68 96  --  >60  GFRAA 79 112  --   --     LIVER FUNCTION TESTS: Recent Labs     12/05/20 1404 03/05/21 0915 06/05/21 1130 07/03/21 1231  BILITOT 0.6 0.7 0.8 0.9  AST 13 13 11 15   ALT 13 12 11 14   ALKPHOS  --   --   --  82  PROT 7.3 6.9 6.9 8.0  ALBUMIN  --   --   --  4.8     Assessment and Plan:  Pancreatic adenocarcinoma with metasteses --planning to start chemo next week --OK for planned port-a-cath today  Risks and benefits of image guided port-a-catheter placement was discussed with the patient including, but not limited to bleeding, infection, pneumothorax, or fibrin sheath development and need for additional procedures.  All of the patient's questions were answered, patient is agreeable to proceed. Consent signed and in chart.   Thank you for this interesting consult.  I greatly enjoyed meeting Margues Filippini and look forward to participating in their care.  A copy of this report was sent to the requesting provider on this date.  Electronically Signed: Pasty Spillers, PA 08/10/2021, 12:39 PM   I spent a total of  15 Minutes  in face to face in clinical consultation, greater than 50% of which was counseling/coordinating care for image-guided port-a-catheter insertion.

## 2021-08-13 ENCOUNTER — Encounter: Payer: Self-pay | Admitting: General Practice

## 2021-08-13 ENCOUNTER — Inpatient Hospital Stay: Payer: 59

## 2021-08-13 ENCOUNTER — Encounter: Payer: Self-pay | Admitting: Hematology

## 2021-08-13 ENCOUNTER — Telehealth: Payer: Self-pay | Admitting: Genetic Counselor

## 2021-08-13 ENCOUNTER — Inpatient Hospital Stay (HOSPITAL_BASED_OUTPATIENT_CLINIC_OR_DEPARTMENT_OTHER): Payer: 59 | Admitting: Hematology

## 2021-08-13 ENCOUNTER — Other Ambulatory Visit: Payer: Self-pay

## 2021-08-13 VITALS — HR 93

## 2021-08-13 VITALS — BP 120/80 | HR 105 | Temp 98.1°F | Resp 18 | Ht 71.0 in | Wt 162.9 lb

## 2021-08-13 DIAGNOSIS — C259 Malignant neoplasm of pancreas, unspecified: Secondary | ICD-10-CM

## 2021-08-13 DIAGNOSIS — Z95828 Presence of other vascular implants and grafts: Secondary | ICD-10-CM

## 2021-08-13 DIAGNOSIS — R918 Other nonspecific abnormal finding of lung field: Secondary | ICD-10-CM | POA: Diagnosis not present

## 2021-08-13 LAB — CMP (CANCER CENTER ONLY)
ALT: 14 U/L (ref 0–44)
AST: 14 U/L — ABNORMAL LOW (ref 15–41)
Albumin: 4 g/dL (ref 3.5–5.0)
Alkaline Phosphatase: 77 U/L (ref 38–126)
Anion gap: 10 (ref 5–15)
BUN: 12 mg/dL (ref 8–23)
CO2: 25 mmol/L (ref 22–32)
Calcium: 10 mg/dL (ref 8.9–10.3)
Chloride: 103 mmol/L (ref 98–111)
Creatinine: 0.88 mg/dL (ref 0.61–1.24)
GFR, Estimated: >60 mL/min (ref >60)
Glucose, Bld: 337 mg/dL — ABNORMAL HIGH (ref 70–99)
Potassium: 4.1 mmol/L (ref 3.5–5.1)
Sodium: 138 mmol/L (ref 135–145)
Total Bilirubin: 0.9 mg/dL (ref 0.3–1.2)
Total Protein: 6.9 g/dL (ref 6.5–8.1)

## 2021-08-13 LAB — CBC WITH DIFFERENTIAL (CANCER CENTER ONLY)
Abs Immature Granulocytes: 0.01 10*3/uL (ref 0.00–0.07)
Basophils Absolute: 0 10*3/uL (ref 0.0–0.1)
Basophils Relative: 1 %
Eosinophils Absolute: 0.2 10*3/uL (ref 0.0–0.5)
Eosinophils Relative: 3 %
HCT: 38.2 % — ABNORMAL LOW (ref 39.0–52.0)
Hemoglobin: 13.7 g/dL (ref 13.0–17.0)
Immature Granulocytes: 0 %
Lymphocytes Relative: 20 %
Lymphs Abs: 1.1 10*3/uL (ref 0.7–4.0)
MCH: 32.8 pg (ref 26.0–34.0)
MCHC: 35.9 g/dL (ref 30.0–36.0)
MCV: 91.4 fL (ref 80.0–100.0)
Monocytes Absolute: 0.4 10*3/uL (ref 0.1–1.0)
Monocytes Relative: 8 %
Neutro Abs: 3.9 10*3/uL (ref 1.7–7.7)
Neutrophils Relative %: 68 %
Platelet Count: 152 10*3/uL (ref 150–400)
RBC: 4.18 MIL/uL — ABNORMAL LOW (ref 4.22–5.81)
RDW: 12 % (ref 11.5–15.5)
WBC Count: 5.6 10*3/uL (ref 4.0–10.5)
nRBC: 0 % (ref 0.0–0.2)

## 2021-08-13 MED ORDER — SODIUM CHLORIDE 0.9 % IV SOLN
10.0000 mg | Freq: Once | INTRAVENOUS | Status: AC
  Administered 2021-08-13: 10 mg via INTRAVENOUS
  Filled 2021-08-13: qty 10

## 2021-08-13 MED ORDER — ATROPINE SULFATE 1 MG/ML IJ SOLN: 0.5000 mg | Freq: Once | INTRAMUSCULAR | Status: AC | PRN

## 2021-08-13 MED ORDER — OXALIPLATIN CHEMO INJECTION 100 MG/20ML
85.0000 mg/m2 | Freq: Once | INTRAVENOUS | Status: AC
  Administered 2021-08-13: 160 mg via INTRAVENOUS
  Filled 2021-08-13: qty 32

## 2021-08-13 MED ORDER — SODIUM CHLORIDE 0.9 % IV SOLN
400.0000 mg/m2 | Freq: Once | INTRAVENOUS | Status: AC
  Administered 2021-08-13: 764 mg via INTRAVENOUS
  Filled 2021-08-13: qty 38.2

## 2021-08-13 MED ORDER — SODIUM CHLORIDE 0.9% FLUSH
10.0000 mL | INTRAVENOUS | Status: DC | PRN
  Administered 2021-08-13: 10 mL via INTRAVENOUS

## 2021-08-13 MED ORDER — TRAMADOL HCL 50 MG PO TABS: 50.0000 mg | ORAL_TABLET | Freq: Four times a day (QID) | ORAL | 0 refills | Status: DC | PRN

## 2021-08-13 MED ORDER — DEXTROSE 5 % IV SOLN: Freq: Once | INTRAVENOUS | Status: AC

## 2021-08-13 MED ORDER — ATROPINE SULFATE 1 MG/ML IJ SOLN
INTRAMUSCULAR | Status: AC
  Administered 2021-08-13: 0.5 mg via INTRAVENOUS
  Filled 2021-08-13: qty 1

## 2021-08-13 MED ORDER — SODIUM CHLORIDE 0.9 % IV SOLN
150.0000 mg/m2 | Freq: Once | INTRAVENOUS | Status: AC
  Administered 2021-08-13: 280 mg via INTRAVENOUS
  Filled 2021-08-13: qty 14

## 2021-08-13 MED ORDER — PALONOSETRON HCL INJECTION 0.25 MG/5ML
0.2500 mg | Freq: Once | INTRAVENOUS | Status: AC
  Administered 2021-08-13: 0.25 mg via INTRAVENOUS
  Filled 2021-08-13: qty 5

## 2021-08-13 MED ORDER — SODIUM CHLORIDE 0.9 % IV SOLN
2400.0000 mg/m2 | INTRAVENOUS | Status: DC
  Administered 2021-08-13: 4600 mg via INTRAVENOUS
  Filled 2021-08-13: qty 92

## 2021-08-13 MED ORDER — SODIUM CHLORIDE 0.9 % IV SOLN
150.0000 mg | Freq: Once | INTRAVENOUS | Status: AC
  Administered 2021-08-13: 150 mg via INTRAVENOUS
  Filled 2021-08-13: qty 150

## 2021-08-13 NOTE — Patient Instructions (Signed)
[facility] ONCOL[facility]  Discharge Instructions: Thank you for choosing [facility] to provide your oncology and hematology care.   If you have a lab appointment with the [facility], please go directly to the [facility] and check in at the registration area.   Wear comfortable clothing and clothing appropriate for easy access to any Portacath or PICC line.   We strive to give you quality time with your provider. You may need to reschedule your appointment if you arrive late (15 or more minutes).  Arriving late affects you and other patients whose appointments are after yours.  Also, if you miss three or more appointments without notifying the office, you may be dismissed from the clinic at the provider's discretion.      For prescription refill requests, have your pharmacy contact our office and allow 72 hours for refills to be completed.    Today you received the following chemotherapy and/or immunotherapy agents oxaliplatin, leucovorin, irinotecan, fluorourcil      To help prevent nausea and vomiting after your treatment, we encourage you to take your nausea medication as directed.  BELOW ARE SYMPTOMS THAT SHOULD BE REPORTED IMMEDIATELY: *FEVER GREATER THAN 100.4 F (38 C) OR HIGHER *CHILLS OR SWEATING *NAUSEA AND VOMITING THAT IS NOT CONTROLLED WITH YOUR NAUSEA MEDICATION *UNUSUAL SHORTNESS OF BREATH *UNUSUAL BRUISING OR BLEEDING *URINARY PROBLEMS (pain or burning when urinating, or frequent urination) *BOWEL PROBLEMS (unusual diarrhea, constipation, pain near the anus) TENDERNESS IN MOUTH AND THROAT WITH OR WITHOUT PRESENCE OF ULCERS (sore throat, sores in mouth, or a toothache) UNUSUAL RASH, SWELLING OR PAIN  UNUSUAL VAGINAL DISCHARGE OR ITCHING   Items with * indicate a potential emergency and should be followed up as soon as possible or go to the Emergency Department if any problems should occur.  Please show the CHEMOTHERAPY ALERT CARD or  IMMUNOTHERAPY ALERT CARD at check-in to the Emergency Department and triage nurse.  Should you have questions after your visit or need to cancel or reschedule your appointment, please contact [facility]  Dept: [phone]  and follow the prompts.  Office hours are 8:00 a.m. to 4:30 p.m. Monday - Friday. Please note that voicemails left after 4:00 p.m. may not be returned until the following business day.  We are closed weekends and major holidays. You have access to a nurse at all times for urgent questions. Please call the main number to the clinic Dept: [phone] and follow the prompts.   For any non-urgent questions, you may also contact your provider using MyChart. We now offer e-Visits for anyone 67 and older to request care online for non-urgent symptoms. For details visit [email or web address].   Also download the MyChart app! Go to the app store, search "MyChart", open the app, select [facility], and log in with your MyChart username and password.  Due to Covid, a mask is required upon entering the hospital/clinic. If you do not have a mask, one will be given to you upon arrival. For doctor visits, patients may have 1 support person aged 24 or older with them. For treatment visits, patients cannot have anyone with them due to current Covid guidelines and our immunocompromised population.

## 2021-08-13 NOTE — Telephone Encounter (Signed)
I attempted to contact Zachary Reid with the results of his BRCAplus genetic test results (8 genes). I left a message requesting he call us back at [phone].  Lucille Passy, MS, [facility] Genetic Counselor [email or web address] (P) [phone]

## 2021-08-13 NOTE — Progress Notes (Signed)
[place]   [phone] [phone]   Clinic Follow up Note   Patient Care Team: [name], MD as PCP - General (Internal Medicine) [name], NP as PCP - Hematology/Oncology (Nurse Practitioner) [name], MD (Internal Medicine) [name], MD as Consulting Physician (Orthopedic Surgery) [name], MD as Consulting Physician (Cardiology) [name], MD as Consulting Physician (Oncology) Royston Bake, RN as Oncology Nurse Navigator (Oncology)  Date of Service:  08/13/2021  CHIEF COMPLAINT: f/u of metastatic pancreatic cancer  CURRENT THERAPY:  First-line FOLFIRINOX, q2weeks, starting 08/13/21  ASSESSMENT & PLAN:  Zachary Reid is a 65 y.o. male with   1. Metastatic pancreatic adenocarcinoma, [id number] with numerous small cavitary lesions in bilateral lungs -found incidentally on recent CT scan for left kidney stone.  He does have some nonspecific symptoms, including epigastric pain, chronic right flank pain, and a dry cough.  He has had significant weight loss in the past several years. 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  Liver lesions are likely benign cysts.  Lung lesions are not hypermetabolic but highly suspicious for metastasis. -He proceeded to colonoscopy and EUS on 07/26/21.  It demonstrated a solid-cystic mass in the genu-body of the pancreas, with endosonographic evidence of SMA abutment, T4N0. Cytology confirmed adenocarcinoma. -IHC molecular testing was normal. He is not a candidate for immunotherapy. -He met with Dr. Altamease Oiler on 08/09/21 for a second opinion/to discuss clinical trials. They agree with our recommendation of FOLFIRINOX as first-line treatment. No suitable clinical trial for him now.  -we discussed GCSF growth factor on day 3. This is not currently on his treatment  regimen. I will work to get this approved by his insurance. -labs reviewed, adequate to proceed with C1 FOLFIRINOX today. I reviewed potential side effects and management with him again. -he knows he will return on day 3 for pump removal. -I will call him in a week for toxicity check. Lab and f/u with NP Lacie as scheduled in 2 weeks.   2. Anxiety, Insomnia -anxiety began 2022. He was started on lexapro on 04/19/21 and wellbutrin on 06/05/21 but came off them about a month ago due to constipation. -reported new anxiety attacks since diagnosis. I prescribed Xanax for him on 07/12/21. He feels this is helpful. -he notes he still has insomnia. I prescribed Ambien 07/27/21, which he has used successfully in the past. -he is scheduled to meet with Dr. Michail Sermon on 08/20/21.   3. Genetic Testing -he reports pancreatic cancer in his mother, prostate cancer in his father, ovarian in PGM, and lung in PGF (smoker). -he proceeded with testing on 07/30/21. Results are pending. -We discussed the benefit of PARP inhibitor if his genetic testing shows positive BRCA1 or BRCA2 mutation   4. Left kidney stone, right flank pain, constipation -f/u urology for kidney stone -he reports constipation since starting lexapro. Despite stopping the medication a month ago, he still has constipation. -we discussed that the chemo can cause diarrhea. I recommend a stool softener for his current constipation, so he does not go into diarrhea.   5. DM  -f/u with PCP     PLAN:  -proceed with C1 FOLFIRINOX today  -return day 3 for pump removal (and Udenyca if approved by insurance) -phone visit in 1 week for toxicity check -lab, f/u, and C2 as scheduled on 08/27/21. -cancel appointment with me on 09/03/21. -I  called in tramadol for him today    No problem-specific Assessment & Plan notes found for this encounter.   SUMMARY OF ONCOLOGIC HISTORY: Oncology History  Pancreatic adenocarcinoma ([place])  07/02/2021 Initial Diagnosis    Pancreatic adenocarcinoma ([place])   07/26/2021 Cancer Staging   Staging form: Exocrine Pancreas, AJCC 8th Edition - Clinical stage from 07/26/2021: Stage IV (cT4, cN0, cM1) - Signed by [name], MD on 07/28/2021 Stage prefix: Initial diagnosis Total positive nodes: 0   08/13/2021 -  Chemotherapy   Patient is on Treatment Plan : PANCREAS Modified FOLFIRINOX q14d x 4 cycles        INTERVAL HISTORY:  Zachary Reid is here for a follow up of pancreatic cancer. He was last seen by me on 07/27/21. He presents to the clinic alone. He notes his wife dropped him off and will pick him up after treatment. He reports his back pain is most bothersome. He notes it comes and goes, when when it does come, it is very painful. He reports he was prescribed gabapentin, which helped some. He denies prior narcotic use. We discussed pain management.   All other systems were reviewed with the patient and are negative.  MEDICAL HISTORY:  Past Medical History:  Diagnosis Date   Abnormal EKG    DM (diabetes mellitus) ([place])    Hyperlipidemia    Hypogonadism male    IBS (irritable bowel syndrome)    Obesity    BMI 31   pancreatic ca 06/28/2021   Vitamin D deficiency     SURGICAL HISTORY: Past Surgical History:  Procedure Laterality Date   APPENDECTOMY     BIOPSY  07/26/2021   Procedure: BIOPSY;  Surgeon: Irving Copas., MD;  Location: [facility];  Service: Gastroenterology;;   COLONOSCOPY WITH PROPOFOL N/A 07/26/2021   Procedure: COLONOSCOPY WITH PROPOFOL;  Surgeon: Irving Copas., MD;  Location: [place];  Service: Gastroenterology;  Laterality: N/A;   ESOPHAGOGASTRODUODENOSCOPY (EGD) WITH PROPOFOL N/A 07/26/2021   Procedure: ESOPHAGOGASTRODUODENOSCOPY (EGD) WITH PROPOFOL;  Surgeon: Rush Landmark Telford Nab., MD;  Location: [facility];  Service: Gastroenterology;  Laterality: N/A;   EUS N/A 07/26/2021   Procedure: UPPER ENDOSCOPIC ULTRASOUND (EUS) RADIAL;  Surgeon: Irving Copas., MD;  Location: [facility];  Service: Gastroenterology;  Laterality: N/A;   FINE NEEDLE ASPIRATION  07/26/2021   Procedure: FINE NEEDLE ASPIRATION (FNA) LINEAR;  Surgeon: Rush Landmark Telford Nab., MD;  Location: [facility];  Service: Gastroenterology;;   IR IMAGING GUIDED PORT INSERTION  08/10/2021   POLYPECTOMY  07/26/2021   Procedure: POLYPECTOMY;  Surgeon: Rush Landmark Telford Nab., MD;  Location: [facility];  Service: Gastroenterology;;   SHOULDER SURGERY Right     I have reviewed the social history and family history with the patient and they are unchanged from previous note.  ALLERGIES:  is allergic to lipitor [atorvastatin].  MEDICATIONS:  Current Outpatient Medications  Medication Sig Dispense Refill   ALPRAZolam (XANAX) 0.25 MG tablet Take 1-2 tablets (0.25-0.5 mg total) by mouth 2 (two) times daily as needed for anxiety. 60 tablet 0   aspirin EC 81 MG tablet Take 81 mg by mouth in the morning and at bedtime. Swallow whole.     buPROPion (WELLBUTRIN XL) 300 MG 24 hr tablet Take 1 tablet every Morning for Mood, Focus & Concentration (Patient not taking: No sig reported) 90 tablet 3   Cholecalciferol (VITAMIN D3) 125 MCG (5000 UT) CAPS Take 5,000 Units by mouth in the morning and at bedtime.     escitalopram (  LEXAPRO) 20 MG tablet Take 1 tablet  Daily  for Mood & Chronic Anxiety (Patient not taking: No sig reported) 90 tablet 1   gabapentin (NEURONTIN) 600 MG tablet Take 600 mg by mouth 3 (three) times daily.     glipiZIDE (GLUCOTROL) 5 MG tablet TAKE 1 TABLET BY MOUTH 2 TO 3 TIMES /DAY WITH MEALS FOR DIABETES (Patient taking differently: Take 10 mg by mouth in the morning, at noon, and at bedtime.) 270 tablet 1   glucose blood test strip Use as instructed 100 each 12   hydrocortisone (ANUSOL-HC) 25 MG suppository Place 1 suppository (25 mg total) rectally at bedtime. QHS x 1-week and then Every Other night until prescription complete 12 suppository 0   hyoscyamine (LEVSIN SL) 0.125  MG SL tablet DISSOLVE ONE TABLET UNDER THE TONGUE 4 TIMES DAILY UP TO EVERY 4 HOURS AS NEEDED FOR NAUSEA, BLOATING, CRAMPING, OR DIARRHEA (Patient not taking: No sig reported) 100 tablet 0   lidocaine-prilocaine (EMLA) cream Apply to affected area once 30 g 3   metFORMIN (GLUCOPHAGE-XR) 500 MG 24 hr tablet Take 500 mg by mouth in the morning, at noon, and at bedtime.     ondansetron (ZOFRAN ODT) 4 MG disintegrating tablet Take 1 tablet (4 mg total) by mouth every 8 (eight) hours as needed for nausea or vomiting. 20 tablet 0   ondansetron (ZOFRAN) 8 MG tablet Take 1 tablet (8 mg total) by mouth 2 (two) times daily as needed. Start on day 3 after chemotherapy. 30 tablet 1   oxyCODONE (ROXICODONE) 5 MG immediate release tablet Take 1 tablet (5 mg total) by mouth every 6 (six) hours as needed for up to 6 doses for severe pain. (Patient not taking: No sig reported) 6 tablet 0   prochlorperazine (COMPAZINE) 10 MG tablet Take 1 tablet (10 mg total) by mouth every 6 (six) hours as needed (Nausea or vomiting). 30 tablet 1   Red Yeast Rice 600 MG TABS Take 300 mg by mouth daily.     simvastatin (ZOCOR) 80 MG tablet Take 1 tablet (80 mg total) by mouth daily. (Patient not taking: No sig reported) 90 tablet 1   traZODone (DESYREL) 150 MG tablet Take  1/2 to 1 tablet  1 hour  before Bedtime for Sleep (Patient taking differently: Take 75 mg by mouth at bedtime. Take  1/2 to 1 tablet  1 hour  before Bedtime for Sleep) 90 tablet 1   zolpidem (AMBIEN) 10 MG tablet Take 0.5-1 tablets (5-10 mg total) by mouth at bedtime as needed for sleep. 30 tablet 0   No current facility-administered medications for this visit.   Facility-Administered Medications Ordered in Other Visits  Medication Dose Route Frequency Provider Last Rate Last Admin   fluorouracil (ADRUCIL) 4,600 mg in sodium chloride 0.9 % 58 mL chemo infusion  2,400 mg/m2 (Treatment Plan Recorded) Intravenous 1 day or 1 dose [name], MD   4,600 mg at 08/13/21  1555    PHYSICAL EXAMINATION: ECOG PERFORMANCE STATUS: 1 - Symptomatic but completely ambulatory  Vitals:   08/13/21 0938  BP: 120/80  Pulse: (!) 105  Resp: 18  Temp: 98.1 F (36.7 C)  SpO2: 98%   Wt Readings from Last 3 Encounters:  08/13/21 162 lb 14.4 oz (73.9 kg)  08/10/21 158 lb (71.7 kg)  07/27/21 160 lb 8 oz (72.8 kg)     GENERAL:alert, no distress and comfortable SKIN: skin color normal, no rashes or significant lesions EYES: normal, Conjunctiva are pink and  non-injected, sclera clear  NEURO: alert & oriented x 3 with fluent speech  LABORATORY DATA:  I have reviewed the data as listed CBC Latest Ref Rng & Units 08/13/2021 07/03/2021 06/05/2021  WBC 4.0 - 10.5 K/uL 5.6 5.9 5.2  Hemoglobin 13.0 - 17.0 g/dL 13.7 15.4 14.0  Hematocrit 39.0 - 52.0 % 38.2(L) 43.1 41.8  Platelets 150 - 400 K/uL 152 166 149     CMP Latest Ref Rng & Units 08/13/2021 07/03/2021 06/05/2021  Glucose 70 - 99 mg/dL 337(H) 268(H) 267(H)  BUN 8 - 23 mg/dL _0 Creatinine 0.61 - 1.24 mg/dL 0.88 1.05 0.88  Sodium 135 - 145 mmol/L 138 139 139  Potassium 3.5 - 5.1 mmol/L 4.1 4.5 4.3  Chloride 98 - 111 mmol/L 103 103 102  CO2 22 - 32 mmol/L _1 Calcium 8.9 - 10.3 mg/dL 10.0 10.3 9.8  Total Protein 6.5 - 8.1 g/dL 6.9 8.0 6.9  Total Bilirubin 0.3 - 1.2 mg/dL 0.9 0.9 0.8  Alkaline Phos 38 - 126 U/L 77 82 -  AST 15 - 41 U/L 14(L) 15 11  ALT 0 - 44 U/L _2 RADIOGRAPHIC STUDIES: I have personally reviewed the radiological images as listed and agreed with the findings in the report. No results found.    No orders of the defined types were placed in this encounter.  All questions were answered. The patient knows to call the clinic with any problems, questions or concerns. No barriers to learning was detected. The total time spent in the appointment was 30 minutes.     [name], MD 08/13/2021   I, Wilburn Mylar, am acting as scribe for [name], MD.   I have reviewed  the above documentation for accuracy and completeness, and I agree with the above.

## 2021-08-13 NOTE — Progress Notes (Signed)
[facility] Spiritual Care Note  Referred by Yancey Flemings for spiritual and emotional support. Met Zachary Reid Zachary Reid in infusion, learning about his life, goals, priorities, and worries. 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 Zachary Reid Zachary Reid is contemplating his mortality, quality of life, and possibility of suffering, as well as ways to support his wife as a caregiver. 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  Provided empathic listening, affirmation of feelings, emotional support, and information about support programming (including caregiver support group). Zachary Reid Zachary Reid and his wife are looking forward to the free massages.  Ensured that Zachary Reid Zachary Reid has my direct dial number and is aware of ongoing chaplain availability. We plan to follow up at his next treatment.   [place], [place], [facility] Pager [phone] Voicemail [phone]

## 2021-08-14 ENCOUNTER — Encounter: Payer: Self-pay | Admitting: Genetic Counselor

## 2021-08-14 ENCOUNTER — Telehealth: Payer: Self-pay | Admitting: Genetic Counselor

## 2021-08-14 DIAGNOSIS — Z1379 Encounter for other screening for genetic and chromosomal anomalies: Secondary | ICD-10-CM | POA: Insufficient documentation

## 2021-08-14 NOTE — Progress Notes (Signed)
Zachary Reid called stating that he has developed interrmittant hiccoughs since getting his chemotherapy yesterday.  I recommended he take a prochlorperazine to see if this will help. He states that otherwise he is tolerating chemotherapy well. He verbalized understanding.

## 2021-08-14 NOTE — Telephone Encounter (Signed)
I contacted Mr. Brindisi to discuss his genetic testing results for BRCAplus (8 genes). The results were negative. We are still awaiting the results for the remaining 69 genes and will call Mr. Kempker once they are available.  Lucille Passy, MS, [facility] Genetic Counselor [email or web address] (P) [phone]

## 2021-08-15 ENCOUNTER — Other Ambulatory Visit: Payer: Self-pay

## 2021-08-15 ENCOUNTER — Encounter: Payer: Self-pay | Admitting: Hematology

## 2021-08-15 ENCOUNTER — Inpatient Hospital Stay: Payer: 59

## 2021-08-15 ENCOUNTER — Telehealth: Payer: Self-pay

## 2021-08-15 VITALS — BP 102/69 | HR 73 | Temp 97.8°F | Resp 16

## 2021-08-15 DIAGNOSIS — Z95828 Presence of other vascular implants and grafts: Secondary | ICD-10-CM

## 2021-08-15 DIAGNOSIS — R918 Other nonspecific abnormal finding of lung field: Secondary | ICD-10-CM | POA: Diagnosis not present

## 2021-08-15 MED ORDER — SODIUM CHLORIDE 0.9% FLUSH: 10.0000 mL | Freq: Once | INTRAVENOUS | Status: DC

## 2021-08-15 MED ORDER — HEPARIN SOD (PORK) LOCK FLUSH 100 UNIT/ML IV SOLN: 500.0000 [IU] | Freq: Once | INTRAVENOUS | Status: DC

## 2021-08-15 NOTE — Telephone Encounter (Signed)
Duplicate Prior Authorization request received from [facility] through CoverMyMeds for Lidocaine-Prilocaine 2.5% Cream. Medication was approved through [employer] on 08/02/21 by telephone. This Prior Authorization does not process by CoverMyMeds for this prescription. Approval Case # is [id number]. Spoke with Nicole Kindred at [employer] number [phone] to verify that prescription was approved. Nicole Kindred stated that [facility] was transmitting the prescription date as 08/01/21 and that the approval was 08/02/21. Nicole Kindred stated that he would call [facility] and advise the Pharmacist to submit the claim date for 08/02/21. Medication is approved from 08/02/21 through 08/02/22. Notified Patient again that medication was authorized.

## 2021-08-16 ENCOUNTER — Ambulatory Visit (HOSPITAL_COMMUNITY): Payer: 59

## 2021-08-16 ENCOUNTER — Other Ambulatory Visit (HOSPITAL_COMMUNITY): Payer: 59

## 2021-08-20 ENCOUNTER — Ambulatory Visit: Payer: Self-pay | Admitting: Genetic Counselor

## 2021-08-20 ENCOUNTER — Other Ambulatory Visit: Payer: Self-pay

## 2021-08-20 ENCOUNTER — Ambulatory Visit (INDEPENDENT_AMBULATORY_CARE_PROVIDER_SITE_OTHER): Payer: 59 | Admitting: Psychologist

## 2021-08-20 ENCOUNTER — Telehealth: Payer: Self-pay | Admitting: Genetic Counselor

## 2021-08-20 DIAGNOSIS — F320 Major depressive disorder, single episode, mild: Secondary | ICD-10-CM | POA: Diagnosis not present

## 2021-08-20 DIAGNOSIS — Z1379 Encounter for other screening for genetic and chromosomal anomalies: Secondary | ICD-10-CM

## 2021-08-20 DIAGNOSIS — C259 Malignant neoplasm of pancreas, unspecified: Secondary | ICD-10-CM

## 2021-08-20 NOTE — Telephone Encounter (Signed)
I attempted to contact Mr. Calderwood to discuss his genetic testing results (77 genes). I left a voicemail requesting he call me back.   Lucille Passy, MS, [facility] Genetic Counselor [email or web address] (P) [phone]

## 2021-08-20 NOTE — Progress Notes (Signed)
HPI:   Mr. Gacek was previously seen in the [facility] clinic due to a personal and family history of cancer and concerns regarding a hereditary predisposition to cancer. Please refer to our prior cancer genetics clinic note for more information regarding our discussion, assessment and recommendations, at the time. Mr. Leu recent genetic test results were disclosed to him, as were recommendations warranted by these results. These results and recommendations are discussed in more detail below.  CANCER HISTORY:  Oncology History  Pancreatic adenocarcinoma ([place])  07/02/2021 Initial Diagnosis   Pancreatic adenocarcinoma ([place])   07/26/2021 Cancer Staging   Staging form: Exocrine Pancreas, AJCC 8th Edition - Clinical stage from 07/26/2021: Stage IV (cT4, cN0, cM1) - Signed by [name], MD on 07/28/2021 Stage prefix: Initial diagnosis Total positive nodes: 0   08/13/2021 -  Chemotherapy   Patient is on Treatment Plan : PANCREAS Modified FOLFIRINOX q14d x 4 cycles      Genetic Testing   [facility] CancerNext-Expanded results (77 genes) were negative. No pathogenic variants were identified. 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 The report date is 08/15/2021.    The CancerNext-Expanded gene panel offered by [facility] and includes sequencing, rearrangement, and RNA analysis for the following 77 genes: AIP, ALK, APC, ATM, AXIN2, BAP1, BARD1, BLM, BMPR1A, BRCA1, BRCA2, BRIP1, CDC73, CDH1, CDK4, CDKN1B, CDKN2A, CHEK2, CTNNA1, DICER1, FANCC, FH, FLCN, GALNT12, KIF1B, LZTR1, MAX, MEN1, MET, MLH1, MSH2, MSH3, MSH6, MUTYH, NBN, NF1, NF2, NTHL1, PALB2, PHOX2B, PMS2, POT1, PRKAR1A, PTCH1, PTEN, RAD51C, RAD51D, RB1, RECQL, RET, SDHA, SDHAF2, SDHB, SDHC, SDHD, SMAD4, SMARCA4, SMARCB1, SMARCE1, STK11, SUFU, TMEM127, TP53, TSC1, TSC2, VHL and XRCC2 (sequencing and deletion/duplication); EGFR, EGLN1, HOXB13, KIT, MITF, PDGFRA, POLD1, and POLE (sequencing only);  EPCAM and GREM1 (deletion/duplication only).       FAMILY HISTORY:  We obtained a detailed, 4-generation family history.  Significant diagnoses are listed below:        Family History  Problem Relation Age of Onset   Pancreatic cancer Mother 61   Prostate cancer Father 65        Prostate   Ovarian cancer Paternal Grandmother 2   Lung cancer Paternal Grandfather          Cigar smoker          Mr. Duvall mother had a personal history of pancreatic cancer diagnosed at 73, she died at 49. His father has a personal history of prostate cancer diagnosed at 71, he had radiation seeds as his treatment and died at 28 due to a brain infection. His paternal grandmother was diagnosed with ovarian cancer at 26, she died at 23 due to ovarian cancer metastasis. His paternal grandfather was diagnosed with lung cancer, he smoked cigars, died at 59.   Mr. Sahni is unaware of previous family history of genetic testing for hereditary cancer risks. There no reported Ashkenazi Jewish ancestry.   GENETIC TEST RESULTS:  The [facility] CancerNext-Expanded Panel found no pathogenic mutations.   The CancerNext-Expanded gene panel offered by [facility] and includes sequencing, rearrangement, and RNA analysis for the following 77 genes: AIP, ALK, APC, ATM, AXIN2, BAP1, BARD1, BLM, BMPR1A, BRCA1, BRCA2, BRIP1, CDC73, CDH1, CDK4, CDKN1B, CDKN2A, CHEK2, CTNNA1, DICER1, FANCC, FH, FLCN, GALNT12, KIF1B, LZTR1, MAX, MEN1, MET, MLH1, MSH2, MSH3, MSH6, MUTYH, NBN, NF1, NF2, NTHL1, PALB2, PHOX2B, PMS2, POT1, PRKAR1A, PTCH1, PTEN, RAD51C, RAD51D, RB1, RECQL, RET, SDHA, SDHAF2, SDHB, SDHC, SDHD, SMAD4, SMARCA4, SMARCB1, SMARCE1, STK11, SUFU, TMEM127, TP53, TSC1,  TSC2, VHL and XRCC2 (sequencing and deletion/duplication); EGFR, EGLN1, HOXB13, KIT, MITF, PDGFRA, POLD1, and POLE (sequencing only); EPCAM and GREM1 (deletion/duplication only).    The test report has been scanned into EPIC and is located under the Molecular  Pathology section of the Results Review tab.  A portion of the result report is included below for reference. Genetic testing reported out on 08/15/2021.         Genetic testing identified a variant of uncertain significance (VUS) in the CDKN1B gene called p.[id number].  At this time, it is unknown if this variant is associated with increased risk for cancer or if it is benign, but most uncertain variants are reclassified to benign. It should not be used to make medical management decisions. With time, we suspect the laboratory will determine the significance of this variant, if any. If the laboratory reclassifies this variant, we will attempt to contact Mr. Lippmann to discuss it further.   Even though a pathogenic variant was not identified, possible explanations for the cancer in the family may include: There may be no hereditary risk for cancer in the family. The cancers in Mr. Heacock and/or his family may be due to other genetic or environmental factors. There may be a gene mutation in one of these genes that current testing methods cannot detect, but that chance is small. There could be another gene that has not yet been discovered, or that we have not yet tested, that is responsible for the cancer diagnoses in the family.  It is also possible there is a hereditary cause for the cancer in the family that Mr. Geurts did not inherit.  Therefore, it is important to remain in touch with cancer genetics in the future so that we can continue to offer Mr. Leclaire the most up to date genetic testing.   ADDITIONAL GENETIC TESTING:  We discussed with Mr. Talent that his genetic testing was fairly extensive.  If there are genes identified to increase cancer risk that can be analyzed in the future, we would be happy to discuss and coordinate this testing at that time.    CANCER SCREENING RECOMMENDATIONS:  Mr. Golson test result is considered negative (normal).  This means that we have not  identified a hereditary cause for his personal and family history of cancer at this time.   An individual's cancer risk and medical management are not determined by genetic test results alone. Overall cancer risk assessment incorporates additional factors, including personal medical history, family history, and any available genetic information that may result in a personalized plan for cancer prevention and surveillance. Therefore, it is recommended he continue to follow the cancer management and screening guidelines provided by his oncology and primary healthcare provider.  RECOMMENDATIONS FOR FAMILY MEMBERS:   Since he did not inherit a mutation in a cancer predisposition gene included on this panel, his daughter could not have inherited a mutation from him in one of these genes. Other members of the family may still carry a pathogenic variant in one of these genes that Mr. Nickerson did not inherit. Based on the family history, we recommend his maternal half siblings consider genetic counseling and testing.  We do not recommend familial testing for the [id number] variant of uncertain significance (VUS).  FOLLOW-UP:  Cancer genetics is a rapidly advancing field and it is possible that new genetic tests will be appropriate for him and/or his family members in the future. We encouraged him to remain in contact with cancer genetics on an  annual basis so we can update his personal and family histories and let him know of advances in cancer genetics that may benefit this family.   Our contact number was provided. Mr. Kelley questions were answered to his satisfaction, and he knows he is welcome to call us at anytime with additional questions or concerns.   Lucille Passy, MS, [facility] Genetic Counselor [email or web address] (P) [phone]

## 2021-08-21 ENCOUNTER — Inpatient Hospital Stay: Payer: 59 | Attending: Hematology | Admitting: Hematology

## 2021-08-21 ENCOUNTER — Encounter: Payer: Self-pay | Admitting: Hematology

## 2021-08-21 DIAGNOSIS — N200 Calculus of kidney: Secondary | ICD-10-CM | POA: Insufficient documentation

## 2021-08-21 DIAGNOSIS — G4700 Insomnia, unspecified: Secondary | ICD-10-CM | POA: Insufficient documentation

## 2021-08-21 DIAGNOSIS — F419 Anxiety disorder, unspecified: Secondary | ICD-10-CM | POA: Insufficient documentation

## 2021-08-21 DIAGNOSIS — C7800 Secondary malignant neoplasm of unspecified lung: Secondary | ICD-10-CM | POA: Insufficient documentation

## 2021-08-21 DIAGNOSIS — Z87442 Personal history of urinary calculi: Secondary | ICD-10-CM | POA: Insufficient documentation

## 2021-08-21 DIAGNOSIS — C259 Malignant neoplasm of pancreas, unspecified: Secondary | ICD-10-CM | POA: Diagnosis not present

## 2021-08-21 DIAGNOSIS — E119 Type 2 diabetes mellitus without complications: Secondary | ICD-10-CM | POA: Insufficient documentation

## 2021-08-21 DIAGNOSIS — Z5111 Encounter for antineoplastic chemotherapy: Secondary | ICD-10-CM | POA: Insufficient documentation

## 2021-08-21 DIAGNOSIS — Z800 Family history of malignant neoplasm of digestive organs: Secondary | ICD-10-CM | POA: Insufficient documentation

## 2021-08-21 DIAGNOSIS — Z8042 Family history of malignant neoplasm of prostate: Secondary | ICD-10-CM | POA: Insufficient documentation

## 2021-08-21 DIAGNOSIS — D61818 Other pancytopenia: Secondary | ICD-10-CM | POA: Insufficient documentation

## 2021-08-21 DIAGNOSIS — Z5189 Encounter for other specified aftercare: Secondary | ICD-10-CM | POA: Insufficient documentation

## 2021-08-21 NOTE — Progress Notes (Signed)
[place]   [phone] [phone]   Clinic Follow up Note   Patient Care Team: [name], MD as PCP - General (Internal Medicine) [name], NP as PCP - Hematology/Oncology (Nurse Practitioner) [name], MD (Internal Medicine) [name], MD as Consulting Physician (Orthopedic Surgery) [name], MD as Consulting Physician (Cardiology) [name], MD as Consulting Physician (Oncology) Royston Bake, RN as Oncology Nurse Navigator (Oncology)  Date of Service:  08/21/2021  I connected with Zachary Reid on 08/21/21 at  8:30 AM EDT by telephone and verified that I am speaking with the correct person using two identifiers.   I discussed the limitations, risks, security and privacy concerns of performing an evaluation and management service by telephone and the availability of in person appointments. I also discussed with the patient that there may be a patient responsible charge related to this service. The patient expressed understanding and agreed to proceed.   Patient's location:  Home  Provider's location:  Office    CHIEF COMPLAINT: f/u of metastatic pancreatic cancer  CURRENT THERAPY:  First-line FOLFIRINOX, q2weeks, starting 08/13/21  ASSESSMENT & PLAN:  Zachary Reid is a 65 y.o. male with   1. Metastatic pancreatic adenocarcinoma, [id number] with numerous small cavitary lesions in bilateral lungs -found incidentally on recent CT scan for left kidney stone.  He does have some nonspecific symptoms, including epigastric pain, chronic right flank pain, and a dry cough.  He has had significant weight loss in the past several years. 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  Liver lesions are likely benign cysts.  Lung lesions are not hypermetabolic but highly suspicious for metastasis. -He  proceeded to colonoscopy and EUS on 07/26/21.  It demonstrated a solid-cystic mass in the genu-body of the pancreas, with endosonographic evidence of SMA abutment, T4N0. Cytology confirmed adenocarcinoma. -IHC molecular testing was normal. He is not a candidate for immunotherapy. -He met with Dr. Altamease Oiler on 08/09/21 for a second opinion/to discuss clinical trials. They agree with our recommendation of FOLFIRINOX as first-line treatment. No suitable clinical trial for him now.  -He started chemotherapy a week ago, tolerated the first cycle very well -Follow-up next week before cycle 2, plan to give G-CSF with cycle 2   2. Anxiety, Insomnia -anxiety began 2022. He was started on lexapro on 04/19/21 and wellbutrin on 06/05/21 but came off them about a month ago due to constipation. -reported new anxiety attacks since diagnosis. I prescribed Xanax for him on 07/12/21. He feels this is helpful. -he notes he still has insomnia. I prescribed Ambien 07/27/21, which he has used successfully in the past. -he is scheduled to meet with Dr. Michail Sermon on 08/20/21.   3. Genetic Testing -he reports pancreatic cancer in his mother, prostate cancer in his father, ovarian in PGM, and lung in PGF (smoker). -he proceeded with testing on 07/30/21. Results are pending. -We discussed the benefit of PARP inhibitor if his genetic testing shows positive BRCA1 or BRCA2 mutation   4. Left kidney stone, right flank pain -f/u urology for kidney stone -He is on gabapentin 600 mg 3 times a day for his right flank pain, and it is not adequately controlled -I prescribed tramadol last week, he will pick up today.  We again reviewed management of pain, and constipation issue.   5. DM  -f/u with PCP     PLAN:  -  He tolerated first cycle chemo well -He will pick up tramadol prescription and start at night as needed -Continue gabapentin 600 mg 3 times daily, okay to increase to 900 mg at night if needed -Follow-up next week before cycle 2  chemo, plan to give G-CSF on day 3   No problem-specific Assessment & Plan notes found for this encounter.   SUMMARY OF ONCOLOGIC HISTORY: Oncology History  Pancreatic adenocarcinoma ([place])  07/02/2021 Initial Diagnosis   Pancreatic adenocarcinoma ([place])   07/26/2021 Cancer Staging   Staging form: Exocrine Pancreas, AJCC 8th Edition - Clinical stage from 07/26/2021: Stage IV (cT4, cN0, cM1) - Signed by [name], MD on 07/28/2021 Stage prefix: Initial diagnosis Total positive nodes: 0   08/13/2021 -  Chemotherapy   Patient is on Treatment Plan : PANCREAS Modified FOLFIRINOX q14d x 4 cycles      Genetic Testing   [facility] CancerNext-Expanded results (77 genes) were negative. No pathogenic variants were identified. 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 The report date is 08/15/2021.    The CancerNext-Expanded gene panel offered by [facility] and includes sequencing, rearrangement, and RNA analysis for the following 77 genes: AIP, ALK, APC, ATM, AXIN2, BAP1, BARD1, BLM, BMPR1A, BRCA1, BRCA2, BRIP1, CDC73, CDH1, CDK4, CDKN1B, CDKN2A, CHEK2, CTNNA1, DICER1, FANCC, FH, FLCN, GALNT12, KIF1B, LZTR1, MAX, MEN1, MET, MLH1, MSH2, MSH3, MSH6, MUTYH, NBN, NF1, NF2, NTHL1, PALB2, PHOX2B, PMS2, POT1, PRKAR1A, PTCH1, PTEN, RAD51C, RAD51D, RB1, RECQL, RET, SDHA, SDHAF2, SDHB, SDHC, SDHD, SMAD4, SMARCA4, SMARCB1, SMARCE1, STK11, SUFU, TMEM127, TP53, TSC1, TSC2, VHL and XRCC2 (sequencing and deletion/duplication); EGFR, EGLN1, HOXB13, KIT, MITF, PDGFRA, POLD1, and POLE (sequencing only); EPCAM and GREM1 (deletion/duplication only).        INTERVAL HISTORY:  Zachary Reid is here for a virtual follow up after first cycle chemo last week.  He verified his identity. he tolerated the treatment well, had a few episode of mild diarrhea after chemo, but resolved quickly.  He had a mild decreased appetite after chemo, which has recovered well.  No other concerns from  chemotherapy  He still has significant flank pain, he rates at 2-8, he is taking gabapentin 600 mg 3 times a day, he has not filled the prescription of tramadol from last week yet.   All other systems were reviewed with the patient and are negative.  MEDICAL HISTORY:  Past Medical History:  Diagnosis Date   Abnormal EKG    DM (diabetes mellitus) ([place])    Hyperlipidemia    Hypogonadism male    IBS (irritable bowel syndrome)    Obesity    BMI 31   pancreatic ca 06/28/2021   Vitamin D deficiency     SURGICAL HISTORY: Past Surgical History:  Procedure Laterality Date   APPENDECTOMY     BIOPSY  07/26/2021   Procedure: BIOPSY;  Surgeon: Irving Copas., MD;  Location: [facility];  Service: Gastroenterology;;   COLONOSCOPY WITH PROPOFOL N/A 07/26/2021   Procedure: COLONOSCOPY WITH PROPOFOL;  Surgeon: Irving Copas., MD;  Location: [facility];  Service: Gastroenterology;  Laterality: N/A;   ESOPHAGOGASTRODUODENOSCOPY (EGD) WITH PROPOFOL N/A 07/26/2021   Procedure: ESOPHAGOGASTRODUODENOSCOPY (EGD) WITH PROPOFOL;  Surgeon: Rush Landmark Telford Nab., MD;  Location: [facility];  Service: Gastroenterology;  Laterality: N/A;   EUS N/A 07/26/2021   Procedure: UPPER ENDOSCOPIC ULTRASOUND (EUS) RADIAL;  Surgeon: Irving Copas., MD;  Location: [facility];  Service: Gastroenterology;  Laterality: N/A;   FINE NEEDLE ASPIRATION  07/26/2021   Procedure:  FINE NEEDLE ASPIRATION (FNA) LINEAR;  Surgeon: Mansouraty, Telford Nab., MD;  Location: [facility];  Service: Gastroenterology;;   IR IMAGING GUIDED PORT INSERTION  08/10/2021   POLYPECTOMY  07/26/2021   Procedure: POLYPECTOMY;  Surgeon: Rush Landmark Telford Nab., MD;  Location: [facility];  Service: Gastroenterology;;   SHOULDER SURGERY Right     I have reviewed the social history and family history with the patient and they are unchanged from previous note.  ALLERGIES:  is allergic to lipitor [atorvastatin].  MEDICATIONS:   Current Outpatient Medications  Medication Sig Dispense Refill   ALPRAZolam (XANAX) 0.25 MG tablet Take 1-2 tablets (0.25-0.5 mg total) by mouth 2 (two) times daily as needed for anxiety. 60 tablet 0   aspirin EC 81 MG tablet Take 81 mg by mouth in the morning and at bedtime. Swallow whole.     buPROPion (WELLBUTRIN XL) 300 MG 24 hr tablet Take 1 tablet every Morning for Mood, Focus & Concentration (Patient not taking: No sig reported) 90 tablet 3   Cholecalciferol (VITAMIN D3) 125 MCG (5000 UT) CAPS Take 5,000 Units by mouth in the morning and at bedtime.     escitalopram (LEXAPRO) 20 MG tablet Take 1 tablet  Daily  for Mood & Chronic Anxiety (Patient not taking: No sig reported) 90 tablet 1   gabapentin (NEURONTIN) 600 MG tablet Take 600 mg by mouth 3 (three) times daily.     glipiZIDE (GLUCOTROL) 5 MG tablet TAKE 1 TABLET BY MOUTH 2 TO 3 TIMES /DAY WITH MEALS FOR DIABETES (Patient taking differently: Take 10 mg by mouth in the morning, at noon, and at bedtime.) 270 tablet 1   glucose blood test strip Use as instructed 100 each 12   hydrocortisone (ANUSOL-HC) 25 MG suppository Place 1 suppository (25 mg total) rectally at bedtime. QHS x 1-week and then Every Other night until prescription complete 12 suppository 0   hyoscyamine (LEVSIN SL) 0.125 MG SL tablet DISSOLVE ONE TABLET UNDER THE TONGUE 4 TIMES DAILY UP TO EVERY 4 HOURS AS NEEDED FOR NAUSEA, BLOATING, CRAMPING, OR DIARRHEA (Patient not taking: No sig reported) 100 tablet 0   lidocaine-prilocaine (EMLA) cream Apply to affected area once 30 g 3   metFORMIN (GLUCOPHAGE-XR) 500 MG 24 hr tablet Take 500 mg by mouth in the morning, at noon, and at bedtime.     ondansetron (ZOFRAN ODT) 4 MG disintegrating tablet Take 1 tablet (4 mg total) by mouth every 8 (eight) hours as needed for nausea or vomiting. 20 tablet 0   ondansetron (ZOFRAN) 8 MG tablet Take 1 tablet (8 mg total) by mouth 2 (two) times daily as needed. Start on day 3 after  chemotherapy. 30 tablet 1   oxyCODONE (ROXICODONE) 5 MG immediate release tablet Take 1 tablet (5 mg total) by mouth every 6 (six) hours as needed for up to 6 doses for severe pain. (Patient not taking: No sig reported) 6 tablet 0   prochlorperazine (COMPAZINE) 10 MG tablet Take 1 tablet (10 mg total) by mouth every 6 (six) hours as needed (Nausea or vomiting). 30 tablet 1   Red Yeast Rice 600 MG TABS Take 300 mg by mouth daily.     simvastatin (ZOCOR) 80 MG tablet Take 1 tablet (80 mg total) by mouth daily. (Patient not taking: No sig reported) 90 tablet 1   traMADol (ULTRAM) 50 MG tablet Take 1 tablet (50 mg total) by mouth every 6 (six) hours as needed. 20 tablet 0   traZODone (DESYREL) 150 MG  tablet Take  1/2 to 1 tablet  1 hour  before Bedtime for Sleep (Patient taking differently: Take 75 mg by mouth at bedtime. Take  1/2 to 1 tablet  1 hour  before Bedtime for Sleep) 90 tablet 1   zolpidem (AMBIEN) 10 MG tablet Take 0.5-1 tablets (5-10 mg total) by mouth at bedtime as needed for sleep. 30 tablet 0   No current facility-administered medications for this visit.    PHYSICAL EXAMINATION: ECOG PERFORMANCE STATUS: 1 - Symptomatic but completely ambulatory  There were no vitals filed for this visit.  Wt Readings from Last 3 Encounters:  08/13/21 162 lb 14.4 oz (73.9 kg)  08/10/21 158 lb (71.7 kg)  07/27/21 160 lb 8 oz (72.8 kg)     GENERAL:alert, no distress and comfortable SKIN: skin color normal, no rashes or significant lesions EYES: normal, Conjunctiva are pink and non-injected, sclera clear  NEURO: alert & oriented x 3 with fluent speech  LABORATORY DATA:  I have reviewed the data as listed CBC Latest Ref Rng & Units 08/13/2021 07/03/2021 06/05/2021  WBC 4.0 - 10.5 K/uL 5.6 5.9 5.2  Hemoglobin 13.0 - 17.0 g/dL 13.7 15.4 14.0  Hematocrit 39.0 - 52.0 % 38.2(L) 43.1 41.8  Platelets 150 - 400 K/uL 152 166 149     CMP Latest Ref Rng & Units 08/13/2021 07/03/2021 06/05/2021  Glucose  70 - 99 mg/dL 337(H) 268(H) 267(H)  BUN 8 - 23 mg/dL $Remove'12 12 14  'iHZeKJY$ Creatinine 0.61 - 1.24 mg/dL 0.88 1.05 0.88  Sodium 135 - 145 mmol/L 138 139 139  Potassium 3.5 - 5.1 mmol/L 4.1 4.5 4.3  Chloride 98 - 111 mmol/L 103 103 102  CO2 22 - 32 mmol/L $RemoveB'25 26 29  'BeTDwUPb$ Calcium 8.9 - 10.3 mg/dL 10.0 10.3 9.8  Total Protein 6.5 - 8.1 g/dL 6.9 8.0 6.9  Total Bilirubin 0.3 - 1.2 mg/dL 0.9 0.9 0.8  Alkaline Phos 38 - 126 U/L 77 82 -  AST 15 - 41 U/L 14(L) 15 11  ALT 0 - 44 U/L $Remo'14 14 11      'MvzTX$ RADIOGRAPHIC STUDIES: I have personally reviewed the radiological images as listed and agreed with the findings in the report. No results found.    No orders of the defined types were placed in this encounter.  I discussed the assessment and treatment plan with the patient. The patient was provided an opportunity to ask questions and all were answered. The patient agreed with the plan and demonstrated an understanding of the instructions.   The patient was advised to call back or seek an in-person evaluation if the symptoms worsen or if the condition fails to improve as anticipated.  I provided 12 minutes of non face-to-face telephone visit time during this encounter, and > 50% was spent counseling as documented under my assessment & plan.   [name], MD 08/21/2021

## 2021-08-22 ENCOUNTER — Telehealth: Payer: Self-pay | Admitting: Genetic Counselor

## 2021-08-22 ENCOUNTER — Encounter: Payer: Self-pay | Admitting: Hematology

## 2021-08-22 ENCOUNTER — Inpatient Hospital Stay: Payer: 59

## 2021-08-22 ENCOUNTER — Inpatient Hospital Stay: Payer: 59 | Admitting: Nurse Practitioner

## 2021-08-22 NOTE — Telephone Encounter (Signed)
I contacted Mr. Reidinger to discuss his genetic testing results. No pathogenic variants were identified in the 77 genes analyzed. Of note, a variant of uncertain significance was identified in the CDKN1B gene.   The test report has been scanned into EPIC and is located under the Molecular Pathology section of the Results Review tab.  A portion of the result report is included below for reference. Detailed clinic note to follow.  Lucille Passy, MS, [facility] Genetic Counselor [email or web address] (P) [phone]

## 2021-08-24 ENCOUNTER — Inpatient Hospital Stay: Payer: 59

## 2021-08-24 ENCOUNTER — Other Ambulatory Visit: Payer: Self-pay | Admitting: Hematology

## 2021-08-24 ENCOUNTER — Encounter: Payer: Self-pay | Admitting: Hematology

## 2021-08-24 MED ORDER — ZOLPIDEM TARTRATE 10 MG PO TABS: 5.0000 mg | ORAL_TABLET | Freq: Every evening | ORAL | 0 refills | Status: DC | PRN

## 2021-08-24 MED FILL — Fosaprepitant Dimeglumine For IV Infusion 150 MG (Base Eq): INTRAVENOUS | Qty: 5 | Status: AC

## 2021-08-24 MED FILL — Dexamethasone Sodium Phosphate Inj 100 MG/10ML: INTRAMUSCULAR | Qty: 1 | Status: AC

## 2021-08-24 NOTE — Progress Notes (Signed)
Mr Treiber left vm requesting refill for [employer].   Forwarded to Dr Burr Medico

## 2021-08-27 ENCOUNTER — Encounter: Payer: Self-pay | Admitting: Hematology

## 2021-08-27 ENCOUNTER — Other Ambulatory Visit: Payer: Self-pay

## 2021-08-27 ENCOUNTER — Inpatient Hospital Stay: Payer: 59

## 2021-08-27 ENCOUNTER — Encounter: Payer: Self-pay | Admitting: Nurse Practitioner

## 2021-08-27 ENCOUNTER — Encounter: Payer: Self-pay | Admitting: General Practice

## 2021-08-27 ENCOUNTER — Inpatient Hospital Stay (HOSPITAL_BASED_OUTPATIENT_CLINIC_OR_DEPARTMENT_OTHER): Payer: 59 | Admitting: Nurse Practitioner

## 2021-08-27 VITALS — BP 111/69 | HR 79 | Temp 97.9°F | Resp 18 | Ht 71.0 in | Wt 164.7 lb

## 2021-08-27 DIAGNOSIS — C259 Malignant neoplasm of pancreas, unspecified: Secondary | ICD-10-CM

## 2021-08-27 DIAGNOSIS — C7800 Secondary malignant neoplasm of unspecified lung: Secondary | ICD-10-CM | POA: Diagnosis not present

## 2021-08-27 DIAGNOSIS — E119 Type 2 diabetes mellitus without complications: Secondary | ICD-10-CM | POA: Diagnosis not present

## 2021-08-27 DIAGNOSIS — G4700 Insomnia, unspecified: Secondary | ICD-10-CM | POA: Diagnosis not present

## 2021-08-27 DIAGNOSIS — Z800 Family history of malignant neoplasm of digestive organs: Secondary | ICD-10-CM | POA: Diagnosis not present

## 2021-08-27 DIAGNOSIS — Z5189 Encounter for other specified aftercare: Secondary | ICD-10-CM | POA: Diagnosis not present

## 2021-08-27 DIAGNOSIS — N200 Calculus of kidney: Secondary | ICD-10-CM | POA: Diagnosis not present

## 2021-08-27 DIAGNOSIS — Z8042 Family history of malignant neoplasm of prostate: Secondary | ICD-10-CM | POA: Diagnosis not present

## 2021-08-27 DIAGNOSIS — Z95828 Presence of other vascular implants and grafts: Secondary | ICD-10-CM

## 2021-08-27 DIAGNOSIS — F419 Anxiety disorder, unspecified: Secondary | ICD-10-CM | POA: Diagnosis not present

## 2021-08-27 DIAGNOSIS — D61818 Other pancytopenia: Secondary | ICD-10-CM | POA: Diagnosis not present

## 2021-08-27 DIAGNOSIS — Z87442 Personal history of urinary calculi: Secondary | ICD-10-CM | POA: Diagnosis not present

## 2021-08-27 DIAGNOSIS — Z5111 Encounter for antineoplastic chemotherapy: Secondary | ICD-10-CM | POA: Diagnosis not present

## 2021-08-27 LAB — CBC WITH DIFFERENTIAL (CANCER CENTER ONLY)
Abs Immature Granulocytes: 0 10*3/uL (ref 0.00–0.07)
Basophils Absolute: 0 10*3/uL (ref 0.0–0.1)
Basophils Relative: 1 %
Eosinophils Absolute: 0.1 10*3/uL (ref 0.0–0.5)
Eosinophils Relative: 4 %
HCT: 33.2 % — ABNORMAL LOW (ref 39.0–52.0)
Hemoglobin: 11.8 g/dL — ABNORMAL LOW (ref 13.0–17.0)
Immature Granulocytes: 0 %
Lymphocytes Relative: 35 %
Lymphs Abs: 1.1 10*3/uL (ref 0.7–4.0)
MCH: 32.5 pg (ref 26.0–34.0)
MCHC: 35.5 g/dL (ref 30.0–36.0)
MCV: 91.5 fL (ref 80.0–100.0)
Monocytes Absolute: 0.3 10*3/uL (ref 0.1–1.0)
Monocytes Relative: 9 %
Neutro Abs: 1.6 10*3/uL — ABNORMAL LOW (ref 1.7–7.7)
Neutrophils Relative %: 51 %
Platelet Count: 103 10*3/uL — ABNORMAL LOW (ref 150–400)
RBC: 3.63 MIL/uL — ABNORMAL LOW (ref 4.22–5.81)
RDW: 12.3 % (ref 11.5–15.5)
WBC Count: 3.1 10*3/uL — ABNORMAL LOW (ref 4.0–10.5)
nRBC: 0 % (ref 0.0–0.2)

## 2021-08-27 LAB — CMP (CANCER CENTER ONLY)
ALT: 18 U/L (ref 0–44)
AST: 14 U/L — ABNORMAL LOW (ref 15–41)
Albumin: 3.5 g/dL (ref 3.5–5.0)
Alkaline Phosphatase: 75 U/L (ref 38–126)
Anion gap: 6 (ref 5–15)
BUN: 8 mg/dL (ref 8–23)
CO2: 26 mmol/L (ref 22–32)
Calcium: 8.9 mg/dL (ref 8.9–10.3)
Chloride: 108 mmol/L (ref 98–111)
Creatinine: 0.75 mg/dL (ref 0.61–1.24)
GFR, Estimated: >60 mL/min (ref >60)
Glucose, Bld: 183 mg/dL — ABNORMAL HIGH (ref 70–99)
Potassium: 3.5 mmol/L (ref 3.5–5.1)
Sodium: 140 mmol/L (ref 135–145)
Total Bilirubin: 0.5 mg/dL (ref 0.3–1.2)
Total Protein: 6.1 g/dL — ABNORMAL LOW (ref 6.5–8.1)

## 2021-08-27 MED ORDER — SODIUM CHLORIDE 0.9 % IV SOLN
2400.0000 mg/m2 | INTRAVENOUS | Status: DC
  Administered 2021-08-27: 4600 mg via INTRAVENOUS
  Filled 2021-08-27: qty 92

## 2021-08-27 MED ORDER — SODIUM CHLORIDE 0.9% FLUSH
10.0000 mL | Freq: Once | INTRAVENOUS | Status: AC
  Administered 2021-08-27: 10 mL via INTRAVENOUS

## 2021-08-27 MED ORDER — OXALIPLATIN CHEMO INJECTION 100 MG/20ML
85.0000 mg/m2 | Freq: Once | INTRAVENOUS | Status: AC
  Administered 2021-08-27: 160 mg via INTRAVENOUS
  Filled 2021-08-27: qty 32

## 2021-08-27 MED ORDER — FOSAPREPITANT DIMEGLUMINE INJECTION 150 MG
150.0000 mg | Freq: Once | INTRAVENOUS | Status: AC
  Administered 2021-08-27: 150 mg via INTRAVENOUS
  Filled 2021-08-27: qty 150

## 2021-08-27 MED ORDER — ATROPINE SULFATE 1 MG/ML IJ SOLN
0.5000 mg | Freq: Once | INTRAMUSCULAR | Status: AC | PRN
  Administered 2021-08-27: 0.5 mg via INTRAVENOUS
  Filled 2021-08-27: qty 1

## 2021-08-27 MED ORDER — LEUCOVORIN CALCIUM INJECTION 350 MG
400.0000 mg/m2 | Freq: Once | INTRAMUSCULAR | Status: AC
  Administered 2021-08-27: 764 mg via INTRAVENOUS
  Filled 2021-08-27: qty 38.2

## 2021-08-27 MED ORDER — SODIUM CHLORIDE 0.9 % IV SOLN
150.0000 mg/m2 | Freq: Once | INTRAVENOUS | Status: AC
  Administered 2021-08-27: 280 mg via INTRAVENOUS
  Filled 2021-08-27: qty 14

## 2021-08-27 MED ORDER — DEXTROSE 5 % IV SOLN: Freq: Once | INTRAVENOUS | Status: AC

## 2021-08-27 MED ORDER — SODIUM CHLORIDE 0.9 % IV SOLN
10.0000 mg | Freq: Once | INTRAVENOUS | Status: AC
  Administered 2021-08-27: 10 mg via INTRAVENOUS
  Filled 2021-08-27: qty 10

## 2021-08-27 MED ORDER — PALONOSETRON HCL INJECTION 0.25 MG/5ML
0.2500 mg | Freq: Once | INTRAVENOUS | Status: AC
  Administered 2021-08-27: 0.25 mg via INTRAVENOUS
  Filled 2021-08-27: qty 5

## 2021-08-27 MED ORDER — GABAPENTIN 600 MG PO TABS: 600.0000 mg | ORAL_TABLET | Freq: Three times a day (TID) | ORAL | 1 refills | Status: DC

## 2021-08-27 NOTE — Progress Notes (Signed)
Met w/ pt to introduce myself as his Arboriculturist.  Unfortunately there aren't any foundations offering copay assistance for his Dx.  I informed him of the [employer], went over what it covers and gave him the income requirement.  Pt stated he exceeds the income so he doesn't qualify for the grant at this time.  I gave him my card in case his situation changes and would like to apply for the grant in the future.

## 2021-08-27 NOTE — Patient Instructions (Addendum)
[facility]  Discharge Instructions: Thank you for choosing [facility] to provide your oncology and hematology care.   If you have a lab appointment with the [facility], please go directly to the [facility] and check in at the registration area.   Wear comfortable clothing and clothing appropriate for easy access to any Portacath or PICC line.   We strive to give you quality time with your provider. You may need to reschedule your appointment if you arrive late (15 or more minutes).  Arriving late affects you and other patients whose appointments are after yours.  Also, if you miss three or more appointments without notifying the office, you may be dismissed from the clinic at the provider's discretion.      For prescription refill requests, have your pharmacy contact our office and allow 72 hours for refills to be completed.    Today you received the following chemotherapy and/or immunotherapy agents: Oxaliplatin, Leucovorin, Irinotecan, & Fluorouracil   To help prevent nausea and vomiting after your treatment, we encourage you to take your nausea medication as directed.  BELOW ARE SYMPTOMS THAT SHOULD BE REPORTED IMMEDIATELY: *FEVER GREATER THAN 100.4 F (38 C) OR HIGHER *CHILLS OR SWEATING *NAUSEA AND VOMITING THAT IS NOT CONTROLLED WITH YOUR NAUSEA MEDICATION *UNUSUAL SHORTNESS OF BREATH *UNUSUAL BRUISING OR BLEEDING *URINARY PROBLEMS (pain or burning when urinating, or frequent urination) *BOWEL PROBLEMS (unusual diarrhea, constipation, pain near the anus) TENDERNESS IN MOUTH AND THROAT WITH OR WITHOUT PRESENCE OF ULCERS (sore throat, sores in mouth, or a toothache) UNUSUAL RASH, SWELLING OR PAIN  UNUSUAL VAGINAL DISCHARGE OR ITCHING   Items with * indicate a potential emergency and should be followed up as soon as possible or go to the Emergency Department if any problems should occur.  Please show the CHEMOTHERAPY ALERT CARD or  IMMUNOTHERAPY ALERT CARD at check-in to the Emergency Department and triage nurse.  Should you have questions after your visit or need to cancel or reschedule your appointment, please contact [facility]  Dept: [phone]  and follow the prompts.  Office hours are 8:00 a.m. to 4:30 p.m. Monday - Friday. Please note that voicemails left after 4:00 p.m. may not be returned until the following business day.  We are closed weekends and major holidays. You have access to a nurse at all times for urgent questions. Please call the main number to the clinic Dept: [phone] and follow the prompts.   For any non-urgent questions, you may also contact your provider using MyChart. We now offer e-Visits for anyone 51 and older to request care online for non-urgent symptoms. For details visit [email or web address].   Also download the MyChart app! Go to the app store, search "MyChart", open the app, select [facility], and log in with your MyChart username and password.  Due to Covid, a mask is required upon entering the hospital/clinic. If you do not have a mask, one will be given to you upon arrival. For doctor visits, patients may have 1 support person aged 78 or older with them. For treatment visits, patients cannot have anyone with them due to current Covid guidelines and our immunocompromised population.   The chemotherapy medication bag should finish at 46 hours, 96 hours, or 7 days. For example, if your pump is scheduled for 46 hours and it was put on at 4:00 p.m., it should finish at 2:00 p.m. the day it is scheduled to come off regardless of  your appointment time.     Estimated time to finish at 1:45 PM.   If the display on your pump reads "Low Volume" and it is beeping, take the batteries out of the pump and come to the cancer center for it to be taken off.   If the pump alarms go off prior to the pump reading "Low Volume" then call [phone] and someone can  assist you.  If the plunger comes out and the chemotherapy medication is leaking out, please use your home chemo spill kit to clean up the spill. Do NOT use paper towels or other household products.  If you have problems or questions regarding your pump, please call either [phone] (24 hours a day) or the cancer center Monday-Friday 8:00 a.m.- 4:30 p.m. at the clinic number and we will assist you. If you are unable to get assistance, then go to the nearest Emergency Department and ask the staff to contact the IV team for assistance.

## 2021-08-27 NOTE — Progress Notes (Signed)
Nutrition Follow-up:  Pt with pancreatic cancer, followed by Dr Burr Medico and receiving folfirinox.    Met with patient during infusion.  Patient reports that his appetite is good.  Does report decreased appetite for few days after treatment but increases after.  Denies nausea. Has constipation, taking dulcolax with relief.  Has been drinking ensure plus as well. Has been eating good sources of protein    Medications: reviewed  Labs: reviewed  Anthropometrics:   Weight 164 lb 11.2 oz today  162 lb on 9/26 160.5 on 9/9  198 lb on 12/05/20   NUTRITION DIAGNOSIS: Unintentional weight loss improving    INTERVENTION:  Encouraged patient to continue high calorie, high protein foods.   Continue high calorie shake for weight gain     MONITORING, EVALUATION, GOAL: weight trends, intake   NEXT VISIT:  Monday, Oct 24th during infusion.    Zachary Reid, Zachary Reid, Zachary Reid Registered Dietitian [phone] (mobile)

## 2021-08-27 NOTE — Progress Notes (Signed)
Zachary Reid   [phone] [phone]   Clinic Follow up Note   Patient Care Team: [name], MD as PCP - General (Internal Medicine) [name], NP as PCP - Hematology/Oncology (Nurse Practitioner) [name], MD (Internal Medicine) [name], MD as Consulting Physician (Orthopedic Surgery) [name], MD as Consulting Physician (Cardiology) [name], MD as Consulting Physician (Oncology) Royston Bake, RN as Oncology Nurse Navigator (Oncology) 08/27/2021  CHIEF COMPLAINT: Follow-up metastatic pancreatic cancer  SUMMARY OF ONCOLOGIC HISTORY: Oncology History  Pancreatic adenocarcinoma ([place])  07/02/2021 Initial Diagnosis   Pancreatic adenocarcinoma ([place])   07/26/2021 Cancer Staging   Staging form: Exocrine Pancreas, AJCC 8th Edition - Clinical stage from 07/26/2021: Stage IV (cT4, cN0, cM1) - Signed by [name], MD on 07/28/2021 Stage prefix: Initial diagnosis Total positive nodes: 0   08/13/2021 -  Chemotherapy   Patient is on Treatment Plan : PANCREAS Modified FOLFIRINOX q14d x 4 cycles      Genetic Testing   [facility] CancerNext-Expanded results (77 genes) were negative. No pathogenic variants were identified. 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 The report date is 08/15/2021.    The CancerNext-Expanded gene panel offered by [facility] and includes sequencing, rearrangement, and RNA analysis for the following 77 genes: AIP, ALK, APC, ATM, AXIN2, BAP1, BARD1, BLM, BMPR1A, BRCA1, BRCA2, BRIP1, CDC73, CDH1, CDK4, CDKN1B, CDKN2A, CHEK2, CTNNA1, DICER1, FANCC, FH, FLCN, GALNT12, KIF1B, LZTR1, MAX, MEN1, MET, MLH1, MSH2, MSH3, MSH6, MUTYH, NBN, NF1, NF2, NTHL1, PALB2, PHOX2B, PMS2, POT1, PRKAR1A, PTCH1, PTEN, RAD51C, RAD51D, RB1, RECQL, RET, SDHA, SDHAF2, SDHB, SDHC, SDHD, SMAD4, SMARCA4, SMARCB1, SMARCE1, STK11, SUFU, TMEM127, TP53, TSC1, TSC2, VHL and XRCC2 (sequencing and  deletion/duplication); EGFR, EGLN1, HOXB13, KIT, MITF, PDGFRA, POLD1, and POLE (sequencing only); EPCAM and GREM1 (deletion/duplication only).       CURRENT THERAPY: First-line FOLFIRINOX, every 2 weeks, starting 08/13/2021  INTERVAL HISTORY: Zachary Reid returns for follow-up and treatment as scheduled.  He began FOLFIRINOX 08/13/2021.  He had a virtual toxicity check on 08/21/2021 and was doing well.  He had moderate fatigue on days 4 and 5, still out of bed and active at home.  He recovered well.  Tuesday and Wednesday of last week he woke up with no pain.  He uses gabapentin and CBD cream to control right flank pain.  He had a good week off, appetite recovered and able to eat and drink what ever he wanted.  He is constipated, takes Dulcolax twice daily with a BM every 3-4 days.  Sleeping okay, generic Ambien is less effective.  His stamina is low, he is requesting handicap placard for certain activities.  Denies mucositis, cold sensitivity, neuropathy in the absence of cold, nausea/vomiting, fever, chills, cough, chest pain, dyspnea, leg edema, rash, or other new concerns.   MEDICAL HISTORY:  Past Medical History:  Diagnosis Date   Abnormal EKG    DM (diabetes mellitus) ([place])    Hyperlipidemia    Hypogonadism male    IBS (irritable bowel syndrome)    Obesity    BMI 31   pancreatic ca 06/28/2021   Vitamin D deficiency     SURGICAL HISTORY: Past Surgical History:  Procedure Laterality Date   APPENDECTOMY     BIOPSY  07/26/2021   Procedure: BIOPSY;  Surgeon: Irving Copas., MD;  Location: [place];  Service: Gastroenterology;;   COLONOSCOPY WITH PROPOFOL N/A 07/26/2021   Procedure: COLONOSCOPY WITH PROPOFOL;  Surgeon: Rush Landmark,  Netty Starring., MD;  Location[facility] ENDOSCOPY;  Service: Gastroenterology;  Laterality: N/A;   ESOPHAGOGASTRODUODENOSCOPY (EGD) WITH PROPOFOL N/A 07/26/2021   Procedure: ESOPHAGOGASTRODUODENOSCOPY (EGD) WITH PROPOFOL;  Surgeon: Meridee Score Netty Starring., MD;   Location: [facility] ENDOSCOPY;  Service: Gastroenterology;  Laterality: N/A;   EUS N/A 07/26/2021   Procedure: UPPER ENDOSCOPIC ULTRASOUND (EUS) RADIAL;  Surgeon: Lemar Lofty., MD;  Location: [facility] ENDOSCOPY;  Service: Gastroenterology;  Laterality: N/A;   FINE NEEDLE ASPIRATION  07/26/2021   Procedure: FINE NEEDLE ASPIRATION (FNA) LINEAR;  Surgeon: Meridee Score Netty Starring., MD;  Location: [facility] ENDOSCOPY;  Service: Gastroenterology;;   IR IMAGING GUIDED PORT INSERTION  08/10/2021   POLYPECTOMY  07/26/2021   Procedure: POLYPECTOMY;  Surgeon: Meridee Score Netty Starring., MD;  Location[facility] ENDOSCOPY;  Service: Gastroenterology;;   SHOULDER SURGERY Right     I have reviewed the social history and family history with the patient and they are unchanged from previous note.  ALLERGIES:  is allergic to lipitor [atorvastatin].  MEDICATIONS:  Current Outpatient Medications  Medication Sig Dispense Refill   ALPRAZolam (XANAX) 0.25 MG tablet Take 1-2 tablets (0.25-0.5 mg total) by mouth 2 (two) times daily as needed for anxiety. 60 tablet 0   aspirin EC 81 MG tablet Take 81 mg by mouth in the morning and at bedtime. Swallow whole.     buPROPion (WELLBUTRIN XL) 300 MG 24 hr tablet Take 1 tablet every Morning for Mood, Focus & Concentration 90 tablet 3   Cholecalciferol (VITAMIN D3) 125 MCG (5000 UT) CAPS Take 5,000 Units by mouth in the morning and at bedtime.     escitalopram (LEXAPRO) 20 MG tablet Take 1 tablet  Daily  for Mood & Chronic Anxiety (Patient taking differently: Take 1 tablet  Daily  for Mood & Chronic Anxiety) 90 tablet 1   glipiZIDE (GLUCOTROL) 5 MG tablet TAKE 1 TABLET BY MOUTH 2 TO 3 TIMES /DAY WITH MEALS FOR DIABETES (Patient taking differently: Take 10 mg by mouth in the morning, at noon, and at bedtime.) 270 tablet 1   glucose blood test strip Use as instructed 100 each 12   hydrocortisone (ANUSOL-HC) 25 MG suppository Place 1 suppository (25 mg total) rectally at bedtime. QHS x 1-week and then  Every Other night until prescription complete 12 suppository 0   hyoscyamine (LEVSIN SL) 0.125 MG SL tablet DISSOLVE ONE TABLET UNDER THE TONGUE 4 TIMES DAILY UP TO EVERY 4 HOURS AS NEEDED FOR NAUSEA, BLOATING, CRAMPING, OR DIARRHEA 100 tablet 0   lidocaine-prilocaine (EMLA) cream Apply to affected area once 30 g 3   metFORMIN (GLUCOPHAGE-XR) 500 MG 24 hr tablet Take 500 mg by mouth in the morning, at noon, and at bedtime.     ondansetron (ZOFRAN ODT) 4 MG disintegrating tablet Take 1 tablet (4 mg total) by mouth every 8 (eight) hours as needed for nausea or vomiting. 20 tablet 0   ondansetron (ZOFRAN) 8 MG tablet Take 1 tablet (8 mg total) by mouth 2 (two) times daily as needed. Start on day 3 after chemotherapy. 30 tablet 1   oxyCODONE (ROXICODONE) 5 MG immediate release tablet Take 1 tablet (5 mg total) by mouth every 6 (six) hours as needed for up to 6 doses for severe pain. 6 tablet 0   prochlorperazine (COMPAZINE) 10 MG tablet Take 1 tablet (10 mg total) by mouth every 6 (six) hours as needed (Nausea or vomiting). 30 tablet 1   Red Yeast Rice 600 MG TABS Take 300 mg by mouth daily.  simvastatin (ZOCOR) 80 MG tablet Take 1 tablet (80 mg total) by mouth daily. 90 tablet 1   traMADol (ULTRAM) 50 MG tablet Take 1 tablet (50 mg total) by mouth every 6 (six) hours as needed. 20 tablet 0   traZODone (DESYREL) 150 MG tablet Take  1/2 to 1 tablet  1 hour  before Bedtime for Sleep (Patient taking differently: Take 75 mg by mouth at bedtime. Take  1/2 to 1 tablet  1 hour  before Bedtime for Sleep) 90 tablet 1   zolpidem (AMBIEN) 10 MG tablet Take 0.5-1 tablets (5-10 mg total) by mouth at bedtime as needed for sleep. 30 tablet 0   gabapentin (NEURONTIN) 600 MG tablet Take 1 tablet (600 mg total) by mouth 3 (three) times daily. 90 tablet 1   No current facility-administered medications for this visit.   Facility-Administered Medications Ordered in Other Visits  Medication Dose Route Frequency Provider  Last Rate Last Admin   atropine injection 0.5 mg  0.5 mg Intravenous Once PRN [name], MD       fluorouracil (ADRUCIL) 4,600 mg in sodium chloride 0.9 % 58 mL chemo infusion  2,400 mg/m2 (Treatment Plan Recorded) Intravenous 1 day or 1 dose [name], MD       irinotecan (CAMPTOSAR) 280 mg in sodium chloride 0.9 % 500 mL chemo infusion  150 mg/m2 (Treatment Plan Recorded) Intravenous Once [name], MD       leucovorin 764 mg in sodium chloride 0.9 % 250 mL infusion  400 mg/m2 (Treatment Plan Recorded) Intravenous Once [name], MD       oxaliplatin (ELOXATIN) 160 mg in dextrose 5 % 500 mL chemo infusion  85 mg/m2 (Treatment Plan Recorded) Intravenous Once [name], MD 266 mL/hr at 08/27/21 1140 160 mg at 08/27/21 1140    PHYSICAL EXAMINATION: ECOG PERFORMANCE STATUS: 1 - Symptomatic but completely ambulatory  Vitals:   08/27/21 0917  BP: 111/69  Pulse: 79  Resp: 18  Temp: 97.9 F (36.6 C)  SpO2: 100%   Filed Weights   08/27/21 0917  Weight: 164 lb 11.2 oz (74.7 kg)    GENERAL:alert, no distress and comfortable SKIN: No rash EYES: sclera clear LUNGS: normal breathing effort HEART:  no lower extremity edema NEURO: alert & oriented x 3 with fluent speech, no focal motor/sensory deficits PAC without erythema  LABORATORY DATA:  I have reviewed the data as listed CBC Latest Ref Rng & Units 08/27/2021 08/13/2021 07/03/2021  WBC 4.0 - 10.5 K/uL 3.1(L) 5.6 5.9  Hemoglobin 13.0 - 17.0 g/dL 11.8(L) 13.7 15.4  Hematocrit 39.0 - 52.0 % 33.2(L) 38.2(L) 43.1  Platelets 150 - 400 K/uL 103(L) 152 166     CMP Latest Ref Rng & Units 08/27/2021 08/13/2021 07/03/2021  Glucose 70 - 99 mg/dL 183(H) 337(H) 268(H)  BUN 8 - 23 mg/dL $Remove'8 12 12  'DwIFMAi$ Creatinine 0.61 - 1.24 mg/dL 0.75 0.88 1.05  Sodium 135 - 145 mmol/L 140 138 139  Potassium 3.5 - 5.1 mmol/L 3.5 4.1 4.5  Chloride 98 - 111 mmol/L 108 103 103  CO2 22 - 32 mmol/L $RemoveB'26 25 26  'AtSefqWT$ Calcium 8.9 - 10.3 mg/dL 8.9 10.0 10.3  Total Protein 6.5 - 8.1  g/dL 6.1(L) 6.9 8.0  Total Bilirubin 0.3 - 1.2 mg/dL 0.5 0.9 0.9  Alkaline Phos 38 - 126 U/L 75 77 82  AST 15 - 41 U/L 14(L) 14(L) 15  ALT 0 - 44 U/L $Remo'18 14 14      'fWDJI$ RADIOGRAPHIC STUDIES:  I have personally reviewed the radiological images as listed and agreed with the findings in the report. No results found.   ASSESSMENT & PLAN: Zachary Reid is a 65 y.o. male with    1. Metastatic pancreatic adenocarcinoma, [id number] with numerous small cavitary lesions in bilateral lungs -found incidentally on recent CT scan for left kidney stone.  He does have some nonspecific symptoms, including epigastric pain, chronic right flank pain, and a dry cough.  He has had significant weight loss in the past several years. 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  Liver lesions are likely benign cysts.  Lung lesions are not hypermetabolic but highly suspicious for metastasis. -He proceeded to colonoscopy and EUS on 07/26/21.  It demonstrated a solid-cystic mass in the genu-body of the pancreas, with endosonographic evidence of SMA abutment, T4N0. Cytology confirmed adenocarcinoma.  Baseline CA 19-9 elevated to 273 on 07/03/2021 -IHC molecular testing was normal. He is not a candidate for immunotherapy. -He met with Dr. Altamease Oiler on 08/09/21 for a second opinion/to discuss clinical trials. They agree with Dr. Ernestina Penna recommendation of FOLFIRINOX as first-line treatment. No suitable clinical trial for him now.  -He began first-line M FOLFIRINOX on 08/13/2021, tolerated well with fatigue, constipation, and mild pancytopenia -plan to give G-CSF with cycle 2   2. Anxiety, Insomnia -anxiety began 2022. He was started on lexapro on 04/19/21 and wellbutrin on 06/05/21 but came off them in August due to constipation -reported new anxiety attacks since diagnosis. Rx Xanax on 07/12/21, helpful. -he notes he still  has insomnia. Rx Ambien, brand is more effective than generic -F/up Dr. Michail Sermon   3. Genetic Testing -he reports pancreatic cancer in his mother, prostate cancer in his father, ovarian in PGM, and lung in PGF (smoker). -he proceeded with testing on 07/30/21. 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 Left kidney stone, right flank pain -f/u urology for kidney stone -He is on gabapentin 600 mg 3 times a day for his right flank pain, partially managed -Uses tramadol as needed, mainly at night   5. DM  -f/u with PCP  Disposition: Mr. Korol appears stable.  He completed cycle 462M FOLFIRINOX, tolerated very well overall with mild fatigue and constipation.  We reviewed symptom management.  He was able to recover and function well.  Right flank pain has slightly improved.  I will refill gabapentin.  There is no clinical evidence of disease progression.  Labs reviewed, Hgb 11.8, platelet 103, ANC 1.6, mild pancytopenia is secondary to chemo.  We reviewed bleeding and infection precautions.  Labs adequate to proceed with cycle 62M FOLFIRINOX at same dose today as planned, he will receive G-CSF on day 3, potential side effects and management reviewed, he agrees to proceed.  He had many questions which I answered to his satisfaction.  We will complete disability placard given his right flank pain and low stamina with prolonged ambulation.  He will consider the timing of the flu vac and next COVID-19 booster.   Follow-up in 2 weeks with cycle 3, or sooner if needed.  Orders Placed This Encounter  Procedures   CA 19.9    Standing Status:   Standing    Number of Occurrences:   50    Standing Expiration Date:   08/27/2022   All questions were answered. The  patient knows to call the clinic with any problems, questions or concerns. No barriers to learning were detected.  Total encounter time was 30  minutes.     [name], NP 08/27/21

## 2021-08-27 NOTE — Progress Notes (Signed)
[facility] Spiritual Care Note  Followed up with Zachary Reid in infusion as planned. He reports good mood and appetite overall. Thanks to a physician friend, he is using the metaphor of dealing with his disease like he would encounter/approach a new and unfamiliar animal in the backyard. This is helping him normalize adjustment as a process.  Provided empathic listening, emotional support, and encouragement. We plan to follow up at his next treatment.   [place], [place], [facility] Pager [phone] Voicemail [phone]

## 2021-08-29 ENCOUNTER — Inpatient Hospital Stay: Payer: 59

## 2021-08-29 ENCOUNTER — Other Ambulatory Visit: Payer: Self-pay

## 2021-08-29 VITALS — BP 109/64 | HR 66 | Temp 98.2°F | Resp 18

## 2021-08-29 DIAGNOSIS — Z5111 Encounter for antineoplastic chemotherapy: Secondary | ICD-10-CM | POA: Diagnosis not present

## 2021-08-29 DIAGNOSIS — C259 Malignant neoplasm of pancreas, unspecified: Secondary | ICD-10-CM

## 2021-08-29 MED ORDER — SODIUM CHLORIDE 0.9% FLUSH
10.0000 mL | INTRAVENOUS | Status: DC | PRN
  Administered 2021-08-29: 10 mL

## 2021-08-29 MED ORDER — PEGFILGRASTIM-CBQV 6 MG/0.6ML ~~LOC~~ SOSY
6.0000 mg | PREFILLED_SYRINGE | Freq: Once | SUBCUTANEOUS | Status: AC
  Administered 2021-08-29: 6 mg via SUBCUTANEOUS
  Filled 2021-08-29: qty 0.6

## 2021-08-29 MED ORDER — HEPARIN SOD (PORK) LOCK FLUSH 100 UNIT/ML IV SOLN
500.0000 [IU] | Freq: Once | INTRAVENOUS | Status: AC | PRN
Start: 2021-08-29 — End: 2021-08-29
  Administered 2021-08-29: 500 [IU]

## 2021-08-31 ENCOUNTER — Telehealth: Payer: Self-pay | Admitting: *Deleted

## 2021-08-31 NOTE — Telephone Encounter (Signed)
Contacted patient to inform that application for handicapped placard completed and signed by Dr. Burr Medico. Patient requested form be mailed to him. Placed in [place] outgoing mail.

## 2021-09-03 ENCOUNTER — Other Ambulatory Visit: Payer: 59

## 2021-09-03 ENCOUNTER — Inpatient Hospital Stay: Payer: 59 | Admitting: Hematology

## 2021-09-03 ENCOUNTER — Ambulatory Visit: Payer: 59

## 2021-09-05 ENCOUNTER — Other Ambulatory Visit: Payer: Self-pay | Admitting: Hematology

## 2021-09-05 ENCOUNTER — Ambulatory Visit: Payer: 59 | Admitting: Psychologist

## 2021-09-05 ENCOUNTER — Other Ambulatory Visit: Payer: 59

## 2021-09-05 ENCOUNTER — Ambulatory Visit: Payer: 59 | Admitting: Hematology

## 2021-09-05 ENCOUNTER — Encounter: Payer: Self-pay | Admitting: Hematology

## 2021-09-05 ENCOUNTER — Ambulatory Visit: Payer: 59

## 2021-09-05 MED ORDER — OXYCODONE HCL 5 MG PO TABS: 5.0000 mg | ORAL_TABLET | Freq: Four times a day (QID) | ORAL | 0 refills | Status: DC | PRN

## 2021-09-07 ENCOUNTER — Ambulatory Visit (INDEPENDENT_AMBULATORY_CARE_PROVIDER_SITE_OTHER): Payer: 59 | Admitting: Psychologist

## 2021-09-07 DIAGNOSIS — F320 Major depressive disorder, single episode, mild: Secondary | ICD-10-CM

## 2021-09-07 MED FILL — Dexamethasone Sodium Phosphate Inj 100 MG/10ML: INTRAMUSCULAR | Qty: 1 | Status: AC

## 2021-09-07 MED FILL — Fosaprepitant Dimeglumine For IV Infusion 150 MG (Base Eq): INTRAVENOUS | Qty: 5 | Status: AC

## 2021-09-10 ENCOUNTER — Inpatient Hospital Stay: Payer: 59

## 2021-09-10 ENCOUNTER — Inpatient Hospital Stay (HOSPITAL_BASED_OUTPATIENT_CLINIC_OR_DEPARTMENT_OTHER): Payer: 59 | Admitting: Nurse Practitioner

## 2021-09-10 ENCOUNTER — Other Ambulatory Visit: Payer: Self-pay

## 2021-09-10 ENCOUNTER — Encounter: Payer: Self-pay | Admitting: Nurse Practitioner

## 2021-09-10 VITALS — BP 105/62 | HR 83 | Temp 98.5°F | Resp 16 | Wt 158.6 lb

## 2021-09-10 DIAGNOSIS — Z95828 Presence of other vascular implants and grafts: Secondary | ICD-10-CM

## 2021-09-10 DIAGNOSIS — C259 Malignant neoplasm of pancreas, unspecified: Secondary | ICD-10-CM

## 2021-09-10 DIAGNOSIS — Z5111 Encounter for antineoplastic chemotherapy: Secondary | ICD-10-CM | POA: Diagnosis not present

## 2021-09-10 LAB — CBC WITH DIFFERENTIAL (CANCER CENTER ONLY)
Abs Immature Granulocytes: 0.08 10*3/uL — ABNORMAL HIGH (ref 0.00–0.07)
Basophils Absolute: 0 10*3/uL (ref 0.0–0.1)
Basophils Relative: 1 %
Eosinophils Absolute: 0.1 10*3/uL (ref 0.0–0.5)
Eosinophils Relative: 1 %
HCT: 33.1 % — ABNORMAL LOW (ref 39.0–52.0)
Hemoglobin: 11.7 g/dL — ABNORMAL LOW (ref 13.0–17.0)
Immature Granulocytes: 1 %
Lymphocytes Relative: 15 %
Lymphs Abs: 1.2 10*3/uL (ref 0.7–4.0)
MCH: 33 pg (ref 26.0–34.0)
MCHC: 35.3 g/dL (ref 30.0–36.0)
MCV: 93.2 fL (ref 80.0–100.0)
Monocytes Absolute: 0.5 10*3/uL (ref 0.1–1.0)
Monocytes Relative: 6 %
Neutro Abs: 6.4 10*3/uL (ref 1.7–7.7)
Neutrophils Relative %: 76 %
Platelet Count: 102 10*3/uL — ABNORMAL LOW (ref 150–400)
RBC: 3.55 MIL/uL — ABNORMAL LOW (ref 4.22–5.81)
RDW: 13.7 % (ref 11.5–15.5)
WBC Count: 8.3 10*3/uL (ref 4.0–10.5)
nRBC: 0 % (ref 0.0–0.2)

## 2021-09-10 LAB — CMP (CANCER CENTER ONLY)
ALT: 22 U/L (ref 0–44)
AST: 19 U/L (ref 15–41)
Albumin: 3.6 g/dL (ref 3.5–5.0)
Alkaline Phosphatase: 92 U/L (ref 38–126)
Anion gap: 8 (ref 5–15)
BUN: 12 mg/dL (ref 8–23)
CO2: 26 mmol/L (ref 22–32)
Calcium: 8.6 mg/dL — ABNORMAL LOW (ref 8.9–10.3)
Chloride: 102 mmol/L (ref 98–111)
Creatinine: 0.81 mg/dL (ref 0.61–1.24)
GFR, Estimated: >60 mL/min (ref >60)
Glucose, Bld: 288 mg/dL — ABNORMAL HIGH (ref 70–99)
Potassium: 3.6 mmol/L (ref 3.5–5.1)
Sodium: 136 mmol/L (ref 135–145)
Total Bilirubin: 0.6 mg/dL (ref 0.3–1.2)
Total Protein: 6.4 g/dL — ABNORMAL LOW (ref 6.5–8.1)

## 2021-09-10 MED ORDER — ATROPINE SULFATE 1 MG/ML IV SOLN
0.5000 mg | Freq: Once | INTRAVENOUS | Status: AC | PRN
  Administered 2021-09-10: 0.5 mg via INTRAVENOUS
  Filled 2021-09-10: qty 1

## 2021-09-10 MED ORDER — SODIUM CHLORIDE 0.9% FLUSH
10.0000 mL | Freq: Once | INTRAVENOUS | Status: AC
  Administered 2021-09-10: 10 mL

## 2021-09-10 MED ORDER — SODIUM CHLORIDE 0.9 % IV SOLN
2400.0000 mg/m2 | INTRAVENOUS | Status: DC
  Administered 2021-09-10: 4600 mg via INTRAVENOUS
  Filled 2021-09-10: qty 92

## 2021-09-10 MED ORDER — DEXAMETHASONE 4 MG PO TABS
ORAL_TABLET | ORAL | 1 refills | Status: DC
Start: 2021-09-10 — End: 2022-01-03

## 2021-09-10 MED ORDER — SODIUM CHLORIDE 0.9 % IV SOLN
400.0000 mg/m2 | Freq: Once | INTRAVENOUS | Status: AC
  Administered 2021-09-10: 764 mg via INTRAVENOUS
  Filled 2021-09-10: qty 38.2

## 2021-09-10 MED ORDER — OXALIPLATIN CHEMO INJECTION 100 MG/20ML
85.0000 mg/m2 | Freq: Once | INTRAVENOUS | Status: AC
  Administered 2021-09-10: 160 mg via INTRAVENOUS
  Filled 2021-09-10: qty 32

## 2021-09-10 MED ORDER — DEXTROSE 5 % IV SOLN: Freq: Once | INTRAVENOUS | Status: AC

## 2021-09-10 MED ORDER — PALONOSETRON HCL INJECTION 0.25 MG/5ML
0.2500 mg | Freq: Once | INTRAVENOUS | Status: AC
  Administered 2021-09-10: 0.25 mg via INTRAVENOUS
  Filled 2021-09-10: qty 5

## 2021-09-10 MED ORDER — SODIUM CHLORIDE 0.9 % IV SOLN
150.0000 mg | Freq: Once | INTRAVENOUS | Status: AC
  Administered 2021-09-10: 150 mg via INTRAVENOUS
  Filled 2021-09-10: qty 150

## 2021-09-10 MED ORDER — SODIUM CHLORIDE 0.9 % IV SOLN
150.0000 mg/m2 | Freq: Once | INTRAVENOUS | Status: AC
  Administered 2021-09-10: 280 mg via INTRAVENOUS
  Filled 2021-09-10: qty 14

## 2021-09-10 MED ORDER — DRONABINOL 5 MG PO CAPS: 5.0000 mg | ORAL_CAPSULE | Freq: Two times a day (BID) | ORAL | 0 refills | Status: DC

## 2021-09-10 MED ORDER — SODIUM CHLORIDE 0.9 % IV SOLN
10.0000 mg | Freq: Once | INTRAVENOUS | Status: AC
  Administered 2021-09-10: 10 mg via INTRAVENOUS
  Filled 2021-09-10: qty 10

## 2021-09-10 NOTE — Progress Notes (Signed)
Nutrition Follow-up:  Patient with pancreatic cancer, followed by Dr Burr Medico and receiving folfirinox.    Met with patient during infusion.  Patient reports feeling good only 3 days this treatment cycle vs last.  Appetite has been decreased.  Reports lack of desire to eat as biggest issue effecting appetite.    Medications: reviewed  Labs: reviewed  Anthropometrics:   Weight 158 lb 9 oz  164 lb 11.2 oz on 10/10 162 lb on 9/26 160 lb 5 oz on 9/9 198 lb on 12/05/20   NUTRITION DIAGNOSIS: Unintentional weight loss continues   INTERVENTION:  Noted provider adding marinol and dexamethasone and IV fluids on day 3 after treatment.   Encouraged patient to write down schedule of "nibbling" q 1-2 hours or setting alarm to remind him to eat.   Discussed high calorie foods and ways to add calories to food with small intake.     MONITORING, EVALUATION, GOAL: weight trends, intake   NEXT VISIT: Monday, Nov 7 during infusion  [place] B. Zenia Resides, [place], [place] Registered Dietitian [phone] (mobile)

## 2021-09-10 NOTE — Progress Notes (Signed)
Zachary Reid   [phone] [phone]   Clinic Follow up Note   Patient Care Team: [name], MD as PCP - General (Internal Medicine) [name], NP as PCP - Hematology/Oncology (Nurse Practitioner) [name], MD (Internal Medicine) [name], MD as Consulting Physician (Orthopedic Surgery) [name], MD as Consulting Physician (Cardiology) [name], MD as Consulting Physician (Oncology) Royston Bake, RN as Oncology Nurse Navigator (Oncology) 09/10/2021  CHIEF COMPLAINT: Follow up metastatic pancreas cancer   SUMMARY OF ONCOLOGIC HISTORY: Oncology History  Pancreatic adenocarcinoma ([place])  07/02/2021 Initial Diagnosis   Pancreatic adenocarcinoma ([place])   07/26/2021 Cancer Staging   Staging form: Exocrine Pancreas, AJCC 8th Edition - Clinical stage from 07/26/2021: Stage IV (cT4, cN0, cM1) - Signed by [name], MD on 07/28/2021 Stage prefix: Initial diagnosis Total positive nodes: 0    08/13/2021 -  Chemotherapy   Patient is on Treatment Plan : PANCREAS Modified FOLFIRINOX q14d x 4 cycles      Genetic Testing   [facility] CancerNext-Expanded results (77 genes) were negative. No pathogenic variants were identified. 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 The report date is 08/15/2021.    The CancerNext-Expanded gene panel offered by [facility] and includes sequencing, rearrangement, and RNA analysis for the following 77 genes: AIP, ALK, APC, ATM, AXIN2, BAP1, BARD1, BLM, BMPR1A, BRCA1, BRCA2, BRIP1, CDC73, CDH1, CDK4, CDKN1B, CDKN2A, CHEK2, CTNNA1, DICER1, FANCC, FH, FLCN, GALNT12, KIF1B, LZTR1, MAX, MEN1, MET, MLH1, MSH2, MSH3, MSH6, MUTYH, NBN, NF1, NF2, NTHL1, PALB2, PHOX2B, PMS2, POT1, PRKAR1A, PTCH1, PTEN, RAD51C, RAD51D, RB1, RECQL, RET, SDHA, SDHAF2, SDHB, SDHC, SDHD, SMAD4, SMARCA4, SMARCB1, SMARCE1, STK11, SUFU, TMEM127, TP53, TSC1, TSC2, VHL and XRCC2 (sequencing and  deletion/duplication); EGFR, EGLN1, HOXB13, KIT, MITF, PDGFRA, POLD1, and POLE (sequencing only); EPCAM and GREM1 (deletion/duplication only).       CURRENT THERAPY: First line mFOLFIRINOX q2 weeks, starting 08/13/21  INTERVAL HISTORY: Zachary Reid returns for follow up and treatment as scheduled. Last seen 08/27/21 and completed cycle 2, GCSF was added.  He developed fatigue by day 4 that was more intense and lasted longer, over a week.  He is out of bed and up at home but not doing much else during that time.  He does not eat or drink much the first 2 days following chemo, due to little interest in food.  Nausea is well managed with Compazine, no vomiting.  Bowels moving with a laxative.  Tramadol was not effective, he continues gabapentin and CBD cream to maintain pain level, takes oxycodone for breakthrough which is more effective.  He requires this usually once a day.  Denies cold sensitivity, neuropathy, mucositis, rash, fever, chills, cough, chest pain, dyspnea, leg edema, or other new concerns.    MEDICAL HISTORY:  Past Medical History:  Diagnosis Date   Abnormal EKG    DM (diabetes mellitus) ([place])    Hyperlipidemia    Hypogonadism male    IBS (irritable bowel syndrome)    Obesity    BMI 31   pancreatic ca 06/28/2021   Vitamin D deficiency     SURGICAL HISTORY: Past Surgical History:  Procedure Laterality Date   APPENDECTOMY     BIOPSY  07/26/2021   Procedure: BIOPSY;  Surgeon: Irving Copas., MD;  Location: [place];  Service: Gastroenterology;;   COLONOSCOPY WITH PROPOFOL N/A 07/26/2021   Procedure: COLONOSCOPY WITH PROPOFOL;  Surgeon: Irving Copas., MD;  Location: [place];  Service:  Gastroenterology;  Laterality: N/A;   ESOPHAGOGASTRODUODENOSCOPY (EGD) WITH PROPOFOL N/A 07/26/2021   Procedure: ESOPHAGOGASTRODUODENOSCOPY (EGD) WITH PROPOFOL;  Surgeon: Rush Landmark Telford Nab., MD;  Location: [place];  Service: Gastroenterology;  Laterality: N/A;    EUS N/A 07/26/2021   Procedure: UPPER ENDOSCOPIC ULTRASOUND (EUS) RADIAL;  Surgeon: Irving Copas., MD;  Location: [place];  Service: Gastroenterology;  Laterality: N/A;   FINE NEEDLE ASPIRATION  07/26/2021   Procedure: FINE NEEDLE ASPIRATION (FNA) LINEAR;  Surgeon: Rush Landmark Telford Nab., MD;  Location: [place];  Service: Gastroenterology;;   IR IMAGING GUIDED PORT INSERTION  08/10/2021   POLYPECTOMY  07/26/2021   Procedure: POLYPECTOMY;  Surgeon: Rush Landmark Telford Nab., MD;  Location: [place];  Service: Gastroenterology;;   SHOULDER SURGERY Right     I have reviewed the social history and family history with the patient and they are unchanged from previous note.  ALLERGIES:  is allergic to lipitor [atorvastatin].  MEDICATIONS:  Current Outpatient Medications  Medication Sig Dispense Refill   dexamethasone (DECADRON) 4 MG tablet Take 1 tablet by mouth in the morning for 3-5 days after chemo, starting after pump disconnect, for nausea, low appetite, and fatigue 20 tablet 1   dronabinol (MARINOL) 5 MG capsule Take 1 capsule (5 mg total) by mouth 2 (two) times daily before a meal. 60 capsule 0   ALPRAZolam (XANAX) 0.25 MG tablet Take 1-2 tablets (0.25-0.5 mg total) by mouth 2 (two) times daily as needed for anxiety. 60 tablet 0   aspirin EC 81 MG tablet Take 81 mg by mouth in the morning and at bedtime. Swallow whole.     buPROPion (WELLBUTRIN XL) 300 MG 24 hr tablet Take 1 tablet every Morning for Mood, Focus & Concentration 90 tablet 3   Cholecalciferol (VITAMIN D3) 125 MCG (5000 UT) CAPS Take 5,000 Units by mouth in the morning and at bedtime.     escitalopram (LEXAPRO) 20 MG tablet Take 1 tablet  Daily  for Mood & Chronic Anxiety (Patient taking differently: Take 1 tablet  Daily  for Mood & Chronic Anxiety) 90 tablet 1   gabapentin (NEURONTIN) 600 MG tablet Take 1 tablet (600 mg total) by mouth 3 (three) times daily. 90 tablet 1   glipiZIDE (GLUCOTROL) 5 MG tablet TAKE 1  TABLET BY MOUTH 2 TO 3 TIMES /DAY WITH MEALS FOR DIABETES (Patient taking differently: Take 10 mg by mouth in the morning, at noon, and at bedtime.) 270 tablet 1   glucose blood test strip Use as instructed 100 each 12   hydrocortisone (ANUSOL-HC) 25 MG suppository Place 1 suppository (25 mg total) rectally at bedtime. QHS x 1-week and then Every Other night until prescription complete 12 suppository 0   hyoscyamine (LEVSIN SL) 0.125 MG SL tablet DISSOLVE ONE TABLET UNDER THE TONGUE 4 TIMES DAILY UP TO EVERY 4 HOURS AS NEEDED FOR NAUSEA, BLOATING, CRAMPING, OR DIARRHEA 100 tablet 0   lidocaine-prilocaine (EMLA) cream Apply to affected area once 30 g 3   metFORMIN (GLUCOPHAGE-XR) 500 MG 24 hr tablet Take 500 mg by mouth in the morning, at noon, and at bedtime.     ondansetron (ZOFRAN ODT) 4 MG disintegrating tablet Take 1 tablet (4 mg total) by mouth every 8 (eight) hours as needed for nausea or vomiting. 20 tablet 0   ondansetron (ZOFRAN) 8 MG tablet Take 1 tablet (8 mg total) by mouth 2 (two) times daily as needed. Start on day 3 after chemotherapy. 30 tablet 1   oxyCODONE (ROXICODONE) 5 MG  immediate release tablet Take 1 tablet (5 mg total) by mouth every 6 (six) hours as needed for up to 6 doses for severe pain. 30 tablet 0   prochlorperazine (COMPAZINE) 10 MG tablet Take 1 tablet (10 mg total) by mouth every 6 (six) hours as needed (Nausea or vomiting). 30 tablet 1   Red Yeast Rice 600 MG TABS Take 300 mg by mouth daily.     simvastatin (ZOCOR) 80 MG tablet Take 1 tablet (80 mg total) by mouth daily. 90 tablet 1   traMADol (ULTRAM) 50 MG tablet Take 1 tablet (50 mg total) by mouth every 6 (six) hours as needed. 20 tablet 0   traZODone (DESYREL) 150 MG tablet Take  1/2 to 1 tablet  1 hour  before Bedtime for Sleep (Patient taking differently: Take 75 mg by mouth at bedtime. Take  1/2 to 1 tablet  1 hour  before Bedtime for Sleep) 90 tablet 1   zolpidem (AMBIEN) 10 MG tablet Take 0.5-1 tablets (5-10  mg total) by mouth at bedtime as needed for sleep. 30 tablet 0   No current facility-administered medications for this visit.   Facility-Administered Medications Ordered in Other Visits  Medication Dose Route Frequency Provider Last Rate Last Admin   atropine injection 0.5 mg  0.5 mg Intravenous Once PRN [name], MD       fluorouracil (ADRUCIL) 4,600 mg in sodium chloride 0.9 % 58 mL chemo infusion  2,400 mg/m2 (Treatment Plan Recorded) Intravenous 1 day or 1 dose [name], MD       irinotecan (CAMPTOSAR) 280 mg in sodium chloride 0.9 % 500 mL chemo infusion  150 mg/m2 (Treatment Plan Recorded) Intravenous Once [name], MD       leucovorin 764 mg in sodium chloride 0.9 % 250 mL infusion  400 mg/m2 (Treatment Plan Recorded) Intravenous Once [name], MD       oxaliplatin (ELOXATIN) 160 mg in dextrose 5 % 500 mL chemo infusion  85 mg/m2 (Treatment Plan Recorded) Intravenous Once [name], MD 266 mL/hr at 09/10/21 1218 160 mg at 09/10/21 1218    PHYSICAL EXAMINATION: ECOG PERFORMANCE STATUS: 1 - Symptomatic but completely ambulatory  Vitals:   09/10/21 1003  BP: 105/62  Pulse: 83  Resp: 16  Temp: 98.5 F (36.9 C)  SpO2: 98%   Filed Weights   09/10/21 1003  Weight: 158 lb 9 oz (71.9 kg)    GENERAL:alert, no distress and comfortable SKIN: No rash EYES: sclera clear NECK: Without mass LUNGS: clear with normal breathing effort HEART: regular rate & rhythm, no lower extremity edema ABDOMEN:abdomen soft, non-tender and normal bowel sounds Musculoskeletal: No focal tenderness NEURO: alert & oriented x 3 with fluent speech, no focal motor/sensory deficits PAC without erythema  LABORATORY DATA:  I have reviewed the data as listed CBC Latest Ref Rng & Units 09/10/2021 08/27/2021 08/13/2021  WBC 4.0 - 10.5 K/uL 8.3 3.1(L) 5.6  Hemoglobin 13.0 - 17.0 g/dL 11.7(L) 11.8(L) 13.7  Hematocrit 39.0 - 52.0 % 33.1(L) 33.2(L) 38.2(L)  Platelets 150 - 400 K/uL 102(L) 103(L) 152      CMP Latest Ref Rng & Units 09/10/2021 08/27/2021 08/13/2021  Glucose 70 - 99 mg/dL 288(H) 183(H) 337(H)  BUN 8 - 23 mg/dL 12 8 12   Creatinine 0.61 - 1.24 mg/dL 0.81 0.75 0.88  Sodium 135 - 145 mmol/L 136 140 138  Potassium 3.5 - 5.1 mmol/L 3.6 3.5 4.1  Chloride 98 - 111 mmol/L 102 108 103  CO2 22 -  32 mmol/L $RemoveB'26 26 25  'rvsJdkty$ Calcium 8.9 - 10.3 mg/dL 8.6(L) 8.9 10.0  Total Protein 6.5 - 8.1 g/dL 6.4(L) 6.1(L) 6.9  Total Bilirubin 0.3 - 1.2 mg/dL 0.6 0.5 0.9  Alkaline Phos 38 - 126 U/L 92 75 77  AST 15 - 41 U/L 19 14(L) 14(L)  ALT 0 - 44 U/L $Remo'22 18 14      'qbYMj$ RADIOGRAPHIC STUDIES: I have personally reviewed the radiological images as listed and agreed with the findings in the report. No results found.   ASSESSMENT & PLAN: Zachary Reid is a 65 y.o. male with    1. Metastatic pancreatic adenocarcinoma, [id number] with numerous small cavitary lesions in bilateral lungs -found incidentally on recent CT scan for left kidney stone.  He does have some nonspecific symptoms, including epigastric pain, chronic right flank pain, and a dry cough.  He has had significant weight loss in the past several years. 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  Liver lesions are likely benign cysts.  Lung lesions are not hypermetabolic but highly suspicious for metastasis. -He proceeded to colonoscopy and EUS on 07/26/21.  It demonstrated a solid-cystic mass in the genu-body of the pancreas, with endosonographic evidence of SMA abutment, T4N0. Cytology confirmed adenocarcinoma.  Baseline CA 19-9 elevated to 273 on 07/03/2021 -IHC molecular testing was normal. He is not a candidate for immunotherapy. -He met with Dr. Altamease Oiler on 08/09/21 for a second opinion/to discuss clinical trials. They agree with Dr. Ernestina Penna recommendation of FOLFIRINOX as first-line treatment. No suitable clinical trial for him now.   -He began first-line mFOLFIRINOX on 08/13/2021, tolerated well with fatigue, constipation, and mild pancytopenia. GCSF added with cycle 2 -fatigue, low appetite, nausea, and constipation are main SE's, lasting little longer s/p cycle 2 but able to recover well    2. Anxiety, Insomnia -anxiety began 2022. He was started on lexapro on 04/19/21 and wellbutrin on 06/05/21 but came off them in August due to constipation -reported new anxiety attacks since diagnosis. Rx Xanax on 07/12/21, helpful. -he notes he still has insomnia. Rx Ambien, brand is more effective than generic -F/up Dr. Michail Sermon   3. Genetic Testing -he reports pancreatic cancer in his mother, prostate cancer in his father, ovarian in PGM, and lung in PGF (smoker). -he proceeded with testing on 07/30/21. 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 Left kidney stone, right flank pain -f/u urology for kidney stone -He is on gabapentin 600 mg 3 times a day and CBD cream for his right flank pain, partially managed -tramadol was not very effective, achieved better pain management on oxycodone PRN   5. DM  -f/u with PCP  Disposition: Zachary Reid appears stable.  He completed cycle 2 mFOLFIRINOX with G-CSF, tolerated moderately well with fatigue, nausea, and constipation. Side effects are accumulating and partially managed with supportive care at home. I prescribed marinol for weight loss/low appetite, and decadron for fatigue, n/v, and low appetite.  He continues to monitor blood sugars, knows they will increase on steroids.  I recommend IVF on day 3. He will meet with dietician today. He is eventually able to recover well after 1-1.5 weeks.  There is no clinical evidence of disease progression.  Labs reviewed, adequate to proceed with cycle 3 mFOLFIRINOX today as planned, with GCSF and fluids on day 3.  F/up and cycle 4 in 2 weeks.   All  questions were answered. The patient knows to call the clinic with any problems, questions or concerns. No barriers to learning were detected.     [name], NP 09/10/21

## 2021-09-11 LAB — CANCER ANTIGEN 19-9: CA 19-9: 251 U/mL — ABNORMAL HIGH (ref 0–35)

## 2021-09-12 ENCOUNTER — Inpatient Hospital Stay: Payer: 59

## 2021-09-12 ENCOUNTER — Other Ambulatory Visit: Payer: Self-pay

## 2021-09-12 ENCOUNTER — Encounter: Payer: Self-pay | Admitting: Hematology

## 2021-09-12 ENCOUNTER — Other Ambulatory Visit: Payer: Self-pay | Admitting: Hematology

## 2021-09-12 VITALS — BP 123/70 | HR 73 | Temp 98.4°F | Resp 18

## 2021-09-12 DIAGNOSIS — Z5111 Encounter for antineoplastic chemotherapy: Secondary | ICD-10-CM | POA: Diagnosis not present

## 2021-09-12 DIAGNOSIS — C259 Malignant neoplasm of pancreas, unspecified: Secondary | ICD-10-CM

## 2021-09-12 MED ORDER — HEPARIN SOD (PORK) LOCK FLUSH 100 UNIT/ML IV SOLN
500.0000 [IU] | Freq: Once | INTRAVENOUS | Status: AC | PRN
  Administered 2021-09-12: 500 [IU]

## 2021-09-12 MED ORDER — SODIUM CHLORIDE 0.9 % IV SOLN: Freq: Once | INTRAVENOUS | Status: AC

## 2021-09-12 MED ORDER — SODIUM CHLORIDE 0.9% FLUSH
10.0000 mL | INTRAVENOUS | Status: DC | PRN
  Administered 2021-09-12: 10 mL

## 2021-09-12 MED ORDER — PEGFILGRASTIM-CBQV 6 MG/0.6ML ~~LOC~~ SOSY
6.0000 mg | PREFILLED_SYRINGE | Freq: Once | SUBCUTANEOUS | Status: AC
  Administered 2021-09-12: 6 mg via SUBCUTANEOUS
  Filled 2021-09-12: qty 0.6

## 2021-09-12 NOTE — Patient Instructions (Signed)

## 2021-09-12 NOTE — Addendum Note (Signed)
Addended by: [name] on: 09/12/2021 02:08 PM   Modules accepted: Orders

## 2021-09-20 ENCOUNTER — Other Ambulatory Visit: Payer: Self-pay | Admitting: Hematology

## 2021-09-20 ENCOUNTER — Other Ambulatory Visit: Payer: Self-pay | Admitting: Internal Medicine

## 2021-09-20 DIAGNOSIS — F321 Major depressive disorder, single episode, moderate: Secondary | ICD-10-CM

## 2021-09-21 ENCOUNTER — Encounter: Payer: Self-pay | Admitting: Hematology

## 2021-09-21 ENCOUNTER — Other Ambulatory Visit: Payer: Self-pay

## 2021-09-21 MED ORDER — ZOLPIDEM TARTRATE 10 MG PO TABS: 5.0000 mg | ORAL_TABLET | Freq: Every evening | ORAL | 0 refills | Status: DC | PRN

## 2021-09-21 MED ORDER — TRAMADOL HCL 50 MG PO TABS: 50.0000 mg | ORAL_TABLET | Freq: Four times a day (QID) | ORAL | 0 refills | Status: DC | PRN

## 2021-09-21 MED FILL — Dexamethasone Sodium Phosphate Inj 100 MG/10ML: INTRAMUSCULAR | Qty: 1 | Status: AC

## 2021-09-21 MED FILL — Fosaprepitant Dimeglumine For IV Infusion 150 MG (Base Eq): INTRAVENOUS | Qty: 5 | Status: AC

## 2021-09-24 ENCOUNTER — Encounter: Payer: Self-pay | Admitting: Hematology

## 2021-09-24 ENCOUNTER — Inpatient Hospital Stay: Payer: Medicare Other | Attending: Hematology

## 2021-09-24 ENCOUNTER — Inpatient Hospital Stay: Payer: Medicare Other

## 2021-09-24 ENCOUNTER — Inpatient Hospital Stay (HOSPITAL_BASED_OUTPATIENT_CLINIC_OR_DEPARTMENT_OTHER): Payer: Medicare Other | Admitting: Hematology

## 2021-09-24 ENCOUNTER — Other Ambulatory Visit: Payer: Self-pay

## 2021-09-24 VITALS — BP 109/76 | HR 99 | Temp 98.5°F | Resp 18 | Ht 71.0 in | Wt 158.6 lb

## 2021-09-24 DIAGNOSIS — E119 Type 2 diabetes mellitus without complications: Secondary | ICD-10-CM | POA: Diagnosis not present

## 2021-09-24 DIAGNOSIS — E559 Vitamin D deficiency, unspecified: Secondary | ICD-10-CM | POA: Diagnosis not present

## 2021-09-24 DIAGNOSIS — E785 Hyperlipidemia, unspecified: Secondary | ICD-10-CM | POA: Diagnosis not present

## 2021-09-24 DIAGNOSIS — F419 Anxiety disorder, unspecified: Secondary | ICD-10-CM | POA: Diagnosis not present

## 2021-09-24 DIAGNOSIS — C259 Malignant neoplasm of pancreas, unspecified: Secondary | ICD-10-CM

## 2021-09-24 DIAGNOSIS — R5383 Other fatigue: Secondary | ICD-10-CM | POA: Insufficient documentation

## 2021-09-24 DIAGNOSIS — Z95828 Presence of other vascular implants and grafts: Secondary | ICD-10-CM

## 2021-09-24 DIAGNOSIS — Z5111 Encounter for antineoplastic chemotherapy: Secondary | ICD-10-CM | POA: Insufficient documentation

## 2021-09-24 DIAGNOSIS — G4700 Insomnia, unspecified: Secondary | ICD-10-CM | POA: Insufficient documentation

## 2021-09-24 DIAGNOSIS — K5900 Constipation, unspecified: Secondary | ICD-10-CM | POA: Diagnosis not present

## 2021-09-24 DIAGNOSIS — C250 Malignant neoplasm of head of pancreas: Secondary | ICD-10-CM | POA: Diagnosis not present

## 2021-09-24 DIAGNOSIS — E039 Hypothyroidism, unspecified: Secondary | ICD-10-CM | POA: Insufficient documentation

## 2021-09-24 DIAGNOSIS — K589 Irritable bowel syndrome without diarrhea: Secondary | ICD-10-CM | POA: Diagnosis not present

## 2021-09-24 DIAGNOSIS — Z5189 Encounter for other specified aftercare: Secondary | ICD-10-CM | POA: Diagnosis not present

## 2021-09-24 DIAGNOSIS — D61818 Other pancytopenia: Secondary | ICD-10-CM | POA: Diagnosis not present

## 2021-09-24 DIAGNOSIS — Z79899 Other long term (current) drug therapy: Secondary | ICD-10-CM | POA: Diagnosis not present

## 2021-09-24 LAB — CMP (CANCER CENTER ONLY)
ALT: 28 U/L (ref 0–44)
AST: 21 U/L (ref 15–41)
Albumin: 3.3 g/dL — ABNORMAL LOW (ref 3.5–5.0)
Alkaline Phosphatase: 172 U/L — ABNORMAL HIGH (ref 38–126)
Anion gap: 9 (ref 5–15)
BUN: 10 mg/dL (ref 8–23)
CO2: 25 mmol/L (ref 22–32)
Calcium: 8.8 mg/dL — ABNORMAL LOW (ref 8.9–10.3)
Chloride: 105 mmol/L (ref 98–111)
Creatinine: 0.86 mg/dL (ref 0.61–1.24)
GFR, Estimated: >60 mL/min (ref >60)
Glucose, Bld: 310 mg/dL — ABNORMAL HIGH (ref 70–99)
Potassium: 3.6 mmol/L (ref 3.5–5.1)
Sodium: 139 mmol/L (ref 135–145)
Total Bilirubin: 0.6 mg/dL (ref 0.3–1.2)
Total Protein: 6.2 g/dL — ABNORMAL LOW (ref 6.5–8.1)

## 2021-09-24 LAB — CBC WITH DIFFERENTIAL (CANCER CENTER ONLY)
Abs Immature Granulocytes: 0.25 10*3/uL — ABNORMAL HIGH (ref 0.00–0.07)
Basophils Absolute: 0.1 10*3/uL (ref 0.0–0.1)
Basophils Relative: 1 %
Eosinophils Absolute: 0.2 10*3/uL (ref 0.0–0.5)
Eosinophils Relative: 1 %
HCT: 35.4 % — ABNORMAL LOW (ref 39.0–52.0)
Hemoglobin: 12.4 g/dL — ABNORMAL LOW (ref 13.0–17.0)
Immature Granulocytes: 2 %
Lymphocytes Relative: 9 %
Lymphs Abs: 1.4 10*3/uL (ref 0.7–4.0)
MCH: 33.4 pg (ref 26.0–34.0)
MCHC: 35 g/dL (ref 30.0–36.0)
MCV: 95.4 fL (ref 80.0–100.0)
Monocytes Absolute: 1 10*3/uL (ref 0.1–1.0)
Monocytes Relative: 6 %
Neutro Abs: 13.5 10*3/uL — ABNORMAL HIGH (ref 1.7–7.7)
Neutrophils Relative %: 81 %
Platelet Count: 139 10*3/uL — ABNORMAL LOW (ref 150–400)
RBC: 3.71 MIL/uL — ABNORMAL LOW (ref 4.22–5.81)
RDW: 15.4 % (ref 11.5–15.5)
WBC Count: 16.4 10*3/uL — ABNORMAL HIGH (ref 4.0–10.5)
nRBC: 0 % (ref 0.0–0.2)

## 2021-09-24 MED ORDER — PROCHLORPERAZINE MALEATE 10 MG PO TABS
10.0000 mg | ORAL_TABLET | Freq: Once | ORAL | Status: AC
  Administered 2021-09-24: 10 mg via ORAL
  Filled 2021-09-24: qty 1

## 2021-09-24 MED ORDER — DEXTROSE 5 % IV SOLN: Freq: Once | INTRAVENOUS | Status: AC

## 2021-09-24 MED ORDER — PALONOSETRON HCL INJECTION 0.25 MG/5ML
0.2500 mg | Freq: Once | INTRAVENOUS | Status: AC
  Administered 2021-09-24: 0.25 mg via INTRAVENOUS
  Filled 2021-09-24: qty 5

## 2021-09-24 MED ORDER — SODIUM CHLORIDE 0.9 % IV SOLN
150.0000 mg/m2 | Freq: Once | INTRAVENOUS | Status: AC
  Administered 2021-09-24: 280 mg via INTRAVENOUS
  Filled 2021-09-24: qty 14

## 2021-09-24 MED ORDER — HEPARIN SOD (PORK) LOCK FLUSH 100 UNIT/ML IV SOLN: 500.0000 [IU] | Freq: Once | INTRAVENOUS | Status: DC

## 2021-09-24 MED ORDER — OXALIPLATIN CHEMO INJECTION 100 MG/20ML
85.0000 mg/m2 | Freq: Once | INTRAVENOUS | Status: AC
  Administered 2021-09-24: 160 mg via INTRAVENOUS
  Filled 2021-09-24: qty 32

## 2021-09-24 MED ORDER — ALPRAZOLAM 0.25 MG PO TABS: 0.2500 mg | ORAL_TABLET | Freq: Two times a day (BID) | ORAL | 0 refills | Status: DC | PRN

## 2021-09-24 MED ORDER — TRAMADOL HCL 50 MG PO TABS
50.0000 mg | ORAL_TABLET | Freq: Four times a day (QID) | ORAL | 0 refills | Status: DC | PRN
Start: 2021-09-24 — End: 2021-11-19

## 2021-09-24 MED ORDER — SODIUM CHLORIDE 0.9% FLUSH
10.0000 mL | Freq: Once | INTRAVENOUS | Status: AC
  Administered 2021-09-24: 10 mL

## 2021-09-24 MED ORDER — SODIUM CHLORIDE 0.9 % IV SOLN
150.0000 mg | Freq: Once | INTRAVENOUS | Status: AC
  Administered 2021-09-24: 150 mg via INTRAVENOUS
  Filled 2021-09-24: qty 150

## 2021-09-24 MED ORDER — ATROPINE SULFATE 1 MG/ML IV SOLN
0.5000 mg | Freq: Once | INTRAVENOUS | Status: AC | PRN
  Administered 2021-09-24: 0.5 mg via INTRAVENOUS
  Filled 2021-09-24: qty 1

## 2021-09-24 MED ORDER — SODIUM CHLORIDE 0.9 % IV SOLN
10.0000 mg | Freq: Once | INTRAVENOUS | Status: AC
  Administered 2021-09-24: 10 mg via INTRAVENOUS
  Filled 2021-09-24: qty 10

## 2021-09-24 MED ORDER — SODIUM CHLORIDE 0.9 % IV SOLN
400.0000 mg/m2 | Freq: Once | INTRAVENOUS | Status: AC
  Administered 2021-09-24: 764 mg via INTRAVENOUS
  Filled 2021-09-24: qty 38.2

## 2021-09-24 MED ORDER — SODIUM CHLORIDE 0.9% FLUSH: 10.0000 mL | INTRAVENOUS | Status: DC | PRN

## 2021-09-24 MED ORDER — SODIUM CHLORIDE 0.9 % IV SOLN
2400.0000 mg/m2 | INTRAVENOUS | Status: DC
  Administered 2021-09-24: 4600 mg via INTRAVENOUS
  Filled 2021-09-24: qty 92

## 2021-09-24 NOTE — Progress Notes (Signed)
Nutrition Follow-up:  Patient with pancreatic cancer, followed by Dr Burr Medico and receiving folfirinox.    Met with patient during infusion.  Patient reports appetite goes down 3-5 days after treatment then picks back up.  Glad that he did not drop any more weight this week.  Reports that he was able to tolerate [employer] milkshake, pizza recently.      Medications: marinol, dexamethasone (3-5 days after treatment)  Labs: glucose 310  Anthropometrics:   Weight 158 lb 9.6 oz today  158 lb 9 oz on 10/24 164 lb 11.2 oz on 10/10 162 lb on 9/26 160 lb 5 oz on 9/9 198 lb on 12/05/20   NUTRITION DIAGNOSIS: Unintentional weight loss continues   INTERVENTION:  Encouraged patient to continue with high calorie foods, nibbling q 1-2 hours    MONITORING, EVALUATION, GOAL: weight trends, intake   NEXT VISIT: Tuesday, Nov 22 during infusion  Zaedyn Covin B. Zenia Resides, [place], [place] Registered Dietitian [phone] (mobile)

## 2021-09-24 NOTE — Progress Notes (Signed)
[place]   [phone] [phone]   Clinic Follow up Note   Patient Care Team: [name], MD as PCP - General (Internal Medicine) [name], NP as PCP - Hematology/Oncology (Nurse Practitioner) [name], MD (Internal Medicine) [name], MD as Consulting Physician (Orthopedic Surgery) [name], MD as Consulting Physician (Cardiology) [name], MD as Consulting Physician (Oncology) Royston Bake, RN as Oncology Nurse Navigator (Oncology)  Date of Service:  09/24/2021  CHIEF COMPLAINT: f/u of metastatic pancreatic cancer  CURRENT THERAPY:  First line FOLFIRINOX, q2 weeks, starting 08/13/21  ASSESSMENT & PLAN:  Zachary Reid is a 65 y.o. male with   1. Metastatic pancreatic adenocarcinoma, [id number] with numerous small cavitary lesions in bilateral lungs, MMR normal  -found incidentally on recent CT scan for left kidney stone.  He does have some nonspecific symptoms, including epigastric pain, chronic right flank pain, and a dry cough.  He has had significant weight loss in the past several years. -He proceeded to colonoscopy and EUS on 07/26/21.  It demonstrated a solid-cystic mass in the genu-body of the pancreas, with endosonographic evidence of SMA abutment, T4N0. Cytology confirmed adenocarcinoma.  Baseline CA 19-9 elevated to 273 on 07/03/2021 -He began first-line mFOLFIRINOX on 08/13/2021, tolerated moderate well with fatigue for 3-7 days, constipation, and mild pancytopenia. He is able to recover well. GCSF added with cycle 2 -given his significant fatigue for several days after chemo, I discussed the option of dose reduction, he would like to continue the current dose for now. We will reduce dose if he has worsening AEs, to balance his quality of life. -we reviewed his pain management and I answered his questions    2. Anxiety, Insomnia -anxiety began 2022. He was started on lexapro on 04/19/21 and wellbutrin on  06/05/21 but came off them in August due to constipation -reported new anxiety attacks since diagnosis. Rx Xanax on 07/12/21, helpful. I refilled today. -he notes he still has insomnia. Ambien prescribed 07/27/21, brand is more effective than generic -F/up Dr. Michail Sermon   3. Genetic Testing -he reports pancreatic cancer in his mother, prostate cancer in his father, ovarian in PGM, and lung in PGF (smoker). -he proceeded with testing on 07/30/21. 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 Left kidney stone, right flank pain -f/u urology for kidney stone -He is on gabapentin 600 mg 3 times a day and CBD cream for his right flank pain, partially managed -tramadol was not very effective, achieved better pain management on oxycodone PRN. -we discussed pain management today. He notes he was previously not taking the tramadol every 6 hours due to fear of running out. I refilled for him, and he will take q6hr.   5. DM  -f/u with PCP   PLAN: -proceed with C4 FOLFIRINOX today, with GCSF on day 3 -I refilled tramadol and xanax today -after a back and forth discussion, pt decided to move C5 appointments (week of 11/21) to following week (week of 11/28), will likely reduce his chemo dose on 11/28 do he can participate his daughter's game on 12/3 -lab, flush, f/u and FOLFIRINOX 12/12 and 12/28  -with 1hr IVF on day 3 with pump d/c, added to his chemo plan    No problem-specific Assessment & Plan notes found for this encounter.   SUMMARY OF ONCOLOGIC HISTORY: Oncology History  Pancreatic adenocarcinoma ([place])  07/02/2021  Initial Diagnosis   Pancreatic adenocarcinoma ([place])   07/26/2021 Cancer Staging   Staging form: Exocrine Pancreas, AJCC 8th Edition - Clinical stage from 07/26/2021: Stage IV (cT4, cN0, cM1) - Signed by [name], MD on 07/28/2021 Stage prefix: Initial diagnosis Total positive nodes: 0     08/13/2021 -  Chemotherapy   Patient is on Treatment Plan : PANCREAS Modified FOLFIRINOX q14d x 4 cycles      Genetic Testing   [facility] CancerNext-Expanded results (77 genes) were negative. No pathogenic variants were identified. 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 The report date is 08/15/2021.    The CancerNext-Expanded gene panel offered by [facility] and includes sequencing, rearrangement, and RNA analysis for the following 77 genes: AIP, ALK, APC, ATM, AXIN2, BAP1, BARD1, BLM, BMPR1A, BRCA1, BRCA2, BRIP1, CDC73, CDH1, CDK4, CDKN1B, CDKN2A, CHEK2, CTNNA1, DICER1, FANCC, FH, FLCN, GALNT12, KIF1B, LZTR1, MAX, MEN1, MET, MLH1, MSH2, MSH3, MSH6, MUTYH, NBN, NF1, NF2, NTHL1, PALB2, PHOX2B, PMS2, POT1, PRKAR1A, PTCH1, PTEN, RAD51C, RAD51D, RB1, RECQL, RET, SDHA, SDHAF2, SDHB, SDHC, SDHD, SMAD4, SMARCA4, SMARCB1, SMARCE1, STK11, SUFU, TMEM127, TP53, TSC1, TSC2, VHL and XRCC2 (sequencing and deletion/duplication); EGFR, EGLN1, HOXB13, KIT, MITF, PDGFRA, POLD1, and POLE (sequencing only); EPCAM and GREM1 (deletion/duplication only).        INTERVAL HISTORY:  Tecumseh Yeagley is here for a follow up of metastatic pancreatic cancer. He was last seen by NP Lacie on 09/10/21. He presents to the clinic accompanied by his wife. He reports he is having fewer "good days" with each cycle.  They had many questions about scheduling and about pain control.  He brought in his HCPOA documentation.   All other systems were reviewed with the patient and are negative.  MEDICAL HISTORY:  Past Medical History:  Diagnosis Date   Abnormal EKG    DM (diabetes mellitus) ([place])    Hyperlipidemia    Hypogonadism male    IBS (irritable bowel syndrome)    Obesity    BMI 31   pancreatic ca 06/28/2021   Vitamin D deficiency     SURGICAL HISTORY: Past Surgical History:  Procedure Laterality Date   APPENDECTOMY     BIOPSY  07/26/2021   Procedure: BIOPSY;  Surgeon: Irving Copas., MD;  Location: [place];  Service: Gastroenterology;;   COLONOSCOPY WITH PROPOFOL N/A 07/26/2021   Procedure: COLONOSCOPY WITH PROPOFOL;  Surgeon: Irving Copas., MD;  Location: [place];  Service: Gastroenterology;  Laterality: N/A;   ESOPHAGOGASTRODUODENOSCOPY (EGD) WITH PROPOFOL N/A 07/26/2021   Procedure: ESOPHAGOGASTRODUODENOSCOPY (EGD) WITH PROPOFOL;  Surgeon: Rush Landmark Telford Nab., MD;  Location: [place];  Service: Gastroenterology;  Laterality: N/A;   EUS N/A 07/26/2021   Procedure: UPPER ENDOSCOPIC ULTRASOUND (EUS) RADIAL;  Surgeon: Irving Copas., MD;  Location: [facility] [place];  Service: Gastroenterology;  Laterality: N/A;   FINE NEEDLE ASPIRATION  07/26/2021   Procedure: FINE NEEDLE ASPIRATION (FNA) LINEAR;  Surgeon: Rush Landmark Telford Nab., MD;  Location: [place];  Service: Gastroenterology;;   IR IMAGING GUIDED PORT INSERTION  08/10/2021   POLYPECTOMY  07/26/2021   Procedure: POLYPECTOMY;  Surgeon: Rush Landmark Telford Nab., MD;  Location: [place];  Service: Gastroenterology;;   SHOULDER SURGERY Right     I have reviewed the social history and family history with the patient and they are unchanged from previous note.  ALLERGIES:  is allergic to lipitor [atorvastatin].  MEDICATIONS:  Current Outpatient Medications  Medication Sig Dispense Refill   ALPRAZolam (XANAX) 0.25 MG tablet Take  1-2 tablets (0.25-0.5 mg total) by mouth 2 (two) times daily as needed for anxiety. 60 tablet 0   aspirin EC 81 MG tablet Take 81 mg by mouth in the morning and at bedtime. Swallow whole.     buPROPion (WELLBUTRIN XL) 300 MG 24 hr tablet Take 1 tablet every Morning for Mood, Focus & Concentration 90 tablet 3   Cholecalciferol (VITAMIN D3) 125 MCG (5000 UT) CAPS Take 5,000 Units by mouth in the morning and at bedtime.     dexamethasone (DECADRON) 4 MG tablet Take 1 tablet by mouth in the morning for 3-5 days after chemo, starting after pump disconnect, for  nausea, low appetite, and fatigue 20 tablet 1   dronabinol (MARINOL) 5 MG capsule Take 1 capsule (5 mg total) by mouth 2 (two) times daily before a meal. 60 capsule 0   escitalopram (LEXAPRO) 20 MG tablet Take 1 tablet  Daily  for Mood & Chronic Anxiety (Patient taking differently: Take 1 tablet  Daily  for Mood & Chronic Anxiety) 90 tablet 1   gabapentin (NEURONTIN) 600 MG tablet Take 1 tablet (600 mg total) by mouth 3 (three) times daily. 90 tablet 1   glipiZIDE (GLUCOTROL) 5 MG tablet TAKE 1 TABLET BY MOUTH 2 TO 3 TIMES /DAY WITH MEALS FOR DIABETES (Patient taking differently: Take 10 mg by mouth in the morning, at noon, and at bedtime.) 270 tablet 1   glucose blood test strip Use as instructed 100 each 12   hydrocortisone (ANUSOL-HC) 25 MG suppository Place 1 suppository (25 mg total) rectally at bedtime. QHS x 1-week and then Every Other night until prescription complete 12 suppository 0   hyoscyamine (LEVSIN SL) 0.125 MG SL tablet DISSOLVE ONE TABLET UNDER THE TONGUE 4 TIMES DAILY UP TO EVERY 4 HOURS AS NEEDED FOR NAUSEA, BLOATING, CRAMPING, OR DIARRHEA 100 tablet 0   lidocaine-prilocaine (EMLA) cream Apply to affected area once 30 g 3   metFORMIN (GLUCOPHAGE-XR) 500 MG 24 hr tablet Take 500 mg by mouth in the morning, at noon, and at bedtime.     ondansetron (ZOFRAN ODT) 4 MG disintegrating tablet Take 1 tablet (4 mg total) by mouth every 8 (eight) hours as needed for nausea or vomiting. 20 tablet 0   ondansetron (ZOFRAN) 8 MG tablet Take 1 tablet (8 mg total) by mouth 2 (two) times daily as needed. Start on day 3 after chemotherapy. 30 tablet 1   oxyCODONE (ROXICODONE) 5 MG immediate release tablet Take 1 tablet (5 mg total) by mouth every 6 (six) hours as needed for up to 6 doses for severe pain. 30 tablet 0   prochlorperazine (COMPAZINE) 10 MG tablet Take 1 tablet (10 mg total) by mouth every 6 (six) hours as needed (Nausea or vomiting). 30 tablet 1   Red Yeast Rice 600 MG TABS Take 300 mg by  mouth daily.     simvastatin (ZOCOR) 80 MG tablet Take 1 tablet (80 mg total) by mouth daily. 90 tablet 1   traMADol (ULTRAM) 50 MG tablet Take 1 tablet (50 mg total) by mouth every 6 (six) hours as needed. 90 tablet 0   traZODone (DESYREL) 150 MG tablet TAKE 1/2 TO 1 TABLET 1 HOUR BEFORE BEDTIME FOR SLEEP 90 tablet 1   zolpidem (AMBIEN) 10 MG tablet Take 0.5-1 tablets (5-10 mg total) by mouth at bedtime as needed for sleep. 30 tablet 0   zolpidem (AMBIEN) 10 MG tablet Take 0.5-1 tablets (5-10 mg total) by mouth at bedtime as needed  for sleep. 30 tablet 0   No current facility-administered medications for this visit.   Facility-Administered Medications Ordered in Other Visits  Medication Dose Route Frequency Provider Last Rate Last Admin   fluorouracil (ADRUCIL) 4,600 mg in sodium chloride 0.9 % 58 mL chemo infusion  2,400 mg/m2 (Treatment Plan Recorded) Intravenous 1 day or 1 dose [name], MD   4,600 mg at 09/24/21 1648   sodium chloride flush (NS) 0.9 % injection 10 mL  10 mL Intracatheter PRN [name], MD        PHYSICAL EXAMINATION: ECOG PERFORMANCE STATUS: 1 - Symptomatic but completely ambulatory  Vitals:   09/24/21 0926  BP: 109/76  Pulse: 99  Resp: 18  Temp: 98.5 F (36.9 C)  SpO2: 99%   Wt Readings from Last 3 Encounters:  09/24/21 158 lb 9.6 oz (71.9 kg)  09/10/21 158 lb 9 oz (71.9 kg)  08/27/21 164 lb 11.2 oz (74.7 kg)     GENERAL:alert, no distress and comfortable SKIN: skin color normal, no rashes or significant lesions EYES: normal, Conjunctiva are pink and non-injected, sclera clear  NEURO: alert & oriented x 3 with fluent speech  LABORATORY DATA:  I have reviewed the data as listed CBC Latest Ref Rng & Units 09/24/2021 09/10/2021 08/27/2021  WBC 4.0 - 10.5 K/uL 16.4(H) 8.3 3.1(L)  Hemoglobin 13.0 - 17.0 g/dL 12.4(L) 11.7(L) 11.8(L)  Hematocrit 39.0 - 52.0 % 35.4(L) 33.1(L) 33.2(L)  Platelets 150 - 400 K/uL 139(L) 102(L) 103(L)     CMP Latest Ref Rng &  Units 09/24/2021 09/10/2021 08/27/2021  Glucose 70 - 99 mg/dL 310(H) 288(H) 183(H)  BUN 8 - 23 mg/dL $Remove'10 12 8  'CyymKHG$ Creatinine 0.61 - 1.24 mg/dL 0.86 0.81 0.75  Sodium 135 - 145 mmol/L 139 136 140  Potassium 3.5 - 5.1 mmol/L 3.6 3.6 3.5  Chloride 98 - 111 mmol/L 105 102 108  CO2 22 - 32 mmol/L $RemoveB'25 26 26  'ooLdegaY$ Calcium 8.9 - 10.3 mg/dL 8.8(L) 8.6(L) 8.9  Total Protein 6.5 - 8.1 g/dL 6.2(L) 6.4(L) 6.1(L)  Total Bilirubin 0.3 - 1.2 mg/dL 0.6 0.6 0.5  Alkaline Phos 38 - 126 U/L 172(H) 92 75  AST 15 - 41 U/L 21 19 14(L)  ALT 0 - 44 U/L $Remo'28 22 18      'apfRp$ RADIOGRAPHIC STUDIES: I have personally reviewed the radiological images as listed and agreed with the findings in the report. No results found.    No orders of the defined types were placed in this encounter.  Pt has many questions. All questions were answered to his satisfaction. The patient knows to call the clinic with any problems, questions or concerns. No barriers to learning was detected. The total time spent in the appointment was 40 minutes.     [name], MD 09/24/2021   I, Wilburn Mylar, am acting as scribe for [name], MD.   I have reviewed the above documentation for accuracy and completeness, and I agree with the above.

## 2021-09-24 NOTE — Patient Instructions (Signed)
Zachary Reid  Discharge Instructions: Thank you for choosing [facility] to provide your Reid and hematology care.   If you have a lab appointment with the [facility], please go directly to the [facility] and check in at the registration area.   Wear comfortable clothing and clothing appropriate for easy access to any Portacath or PICC line.   We strive to give you quality time with your provider. You may need to reschedule your appointment if you arrive late (15 or more minutes).  Arriving late affects you and other patients whose appointments are after yours.  Also, if you miss three or more appointments without notifying the office, you may be dismissed from the clinic at the provider's discretion.      For prescription refill requests, have your pharmacy contact our office and allow 72 hours for refills to be completed.    Today you received the following chemotherapy and/or immunotherapy agents: Oxaliplatin, Irinotecan, Leucovorin, and Fluorouracil.   To help prevent nausea and vomiting after your treatment, we encourage you to take your nausea medication as directed.  BELOW ARE SYMPTOMS THAT SHOULD BE REPORTED IMMEDIATELY: *FEVER GREATER THAN 100.4 F (38 C) OR HIGHER *CHILLS OR SWEATING *NAUSEA AND VOMITING THAT IS NOT CONTROLLED WITH YOUR NAUSEA MEDICATION *UNUSUAL SHORTNESS OF BREATH *UNUSUAL BRUISING OR BLEEDING *URINARY PROBLEMS (pain or burning when urinating, or frequent urination) *BOWEL PROBLEMS (unusual diarrhea, constipation, pain near the anus) TENDERNESS IN MOUTH AND THROAT WITH OR WITHOUT PRESENCE OF ULCERS (sore throat, sores in mouth, or a toothache) UNUSUAL RASH, SWELLING OR PAIN  UNUSUAL VAGINAL DISCHARGE OR ITCHING   Items with * indicate a potential emergency and should be followed up as soon as possible or go to the Emergency Department if any problems should occur.  Please show the CHEMOTHERAPY ALERT CARD  or IMMUNOTHERAPY ALERT CARD at check-in to the Emergency Department and triage nurse.  Should you have questions after your visit or need to cancel or reschedule your appointment, please contact [facility]  Dept: [phone]  and follow the prompts.  Office hours are 8:00 a.m. to 4:30 p.m. Monday - Friday. Please note that voicemails left after 4:00 p.m. may not be returned until the following business day.  We are closed weekends and major holidays. You have access to a nurse at all times for urgent questions. Please call the main number to the clinic Dept: [phone] and follow the prompts.   For any non-urgent questions, you may also contact your provider using MyChart. We now offer e-Visits for anyone 25 and older to request care online for non-urgent symptoms. For details visit [email or web address].   Also download the MyChart app! Go to the app store, search "MyChart", open the app, select [facility], and log in with your MyChart username and password.  Due to Covid, a mask is required upon entering the hospital/clinic. If you do not have a mask, one will be given to you upon arrival. For doctor visits, patients may have 1 support person aged 38 or older with them. For treatment visits, patients cannot have anyone with them due to current Covid guidelines and our immunocompromised population.   The chemotherapy medication bag should finish at 46 hours, 96 hours, or 7 days. For example, if your pump is scheduled for 46 hours and it was put on at 4:00 p.m., it should finish at 2:00 p.m. the day it is scheduled to come off regardless of  your appointment time.     Estimated time to finish at:    If the display on your pump reads "Low Volume" and it is beeping, take the batteries out of the pump and come to the cancer center for it to be taken off.   If the pump alarms go off prior to the pump reading "Low Volume" then call [phone] and someone can assist  you.  If the plunger comes out and the chemotherapy medication is leaking out, please use your home chemo spill kit to clean up the spill. Do NOT use paper towels or other household products.  If you have problems or questions regarding your pump, please call either [phone] (24 hours a day) or the cancer center Monday-Friday 8:00 a.m.- 4:30 p.m. at the clinic number and we will assist you. If you are unable to get assistance, then go to the nearest Emergency Department and ask the staff to contact the IV team for assistance.

## 2021-09-24 NOTE — Patient Instructions (Signed)

## 2021-09-25 ENCOUNTER — Telehealth: Payer: Self-pay | Admitting: Hematology

## 2021-09-25 NOTE — Telephone Encounter (Signed)
Left message with follow-up appointments per 11/7 los.

## 2021-09-26 ENCOUNTER — Inpatient Hospital Stay: Payer: Medicare Other

## 2021-09-26 ENCOUNTER — Encounter: Payer: Self-pay | Admitting: General Practice

## 2021-09-26 ENCOUNTER — Other Ambulatory Visit: Payer: Self-pay

## 2021-09-26 DIAGNOSIS — Z5111 Encounter for antineoplastic chemotherapy: Secondary | ICD-10-CM | POA: Diagnosis not present

## 2021-09-26 DIAGNOSIS — C259 Malignant neoplasm of pancreas, unspecified: Secondary | ICD-10-CM

## 2021-09-26 MED ORDER — PEGFILGRASTIM-CBQV 6 MG/0.6ML ~~LOC~~ SOSY
6.0000 mg | PREFILLED_SYRINGE | Freq: Once | SUBCUTANEOUS | Status: AC
  Administered 2021-09-26: 6 mg via SUBCUTANEOUS
  Filled 2021-09-26: qty 0.6

## 2021-09-26 MED ORDER — HEPARIN SOD (PORK) LOCK FLUSH 100 UNIT/ML IV SOLN
500.0000 [IU] | Freq: Once | INTRAVENOUS | Status: AC | PRN
  Administered 2021-09-26: 500 [IU]

## 2021-09-26 MED ORDER — SODIUM CHLORIDE 0.9% FLUSH
10.0000 mL | INTRAVENOUS | Status: DC | PRN
  Administered 2021-09-26: 10 mL

## 2021-09-26 MED ORDER — SODIUM CHLORIDE 0.9 % IV SOLN: INTRAVENOUS | Status: AC

## 2021-09-26 NOTE — Progress Notes (Signed)
[facility] Spiritual Care Note  Followed up with Zachary Reid in infusion. He notes that he continues to be doing well overall, reporting some fatigue with treatment and looking forward to a longer stretch off from treatment to celebrate Thanksgiving. His natural upbeat personality appears to be serving him well through this process. Zachary Reid welcomes a follow-up visit in infusion after Thanksgiving.   [place], [place], [facility] Pager [phone] Voicemail [phone]

## 2021-09-26 NOTE — Patient Instructions (Signed)

## 2021-10-01 ENCOUNTER — Ambulatory Visit: Payer: 59 | Admitting: Psychologist

## 2021-10-09 ENCOUNTER — Inpatient Hospital Stay: Payer: Medicare Other

## 2021-10-09 ENCOUNTER — Inpatient Hospital Stay: Payer: Medicare Other | Admitting: Hematology

## 2021-10-09 ENCOUNTER — Encounter: Payer: 59 | Admitting: Dietician

## 2021-10-12 ENCOUNTER — Ambulatory Visit: Payer: 59

## 2021-10-12 ENCOUNTER — Inpatient Hospital Stay: Payer: Medicare Other

## 2021-10-13 ENCOUNTER — Other Ambulatory Visit: Payer: Self-pay | Admitting: Internal Medicine

## 2021-10-13 DIAGNOSIS — F321 Major depressive disorder, single episode, moderate: Secondary | ICD-10-CM

## 2021-10-15 ENCOUNTER — Ambulatory Visit: Payer: 59 | Admitting: Adult Health

## 2021-10-15 ENCOUNTER — Other Ambulatory Visit: Payer: Self-pay | Admitting: Hematology

## 2021-10-16 ENCOUNTER — Inpatient Hospital Stay: Payer: Medicare Other

## 2021-10-16 ENCOUNTER — Encounter: Payer: Self-pay | Admitting: General Practice

## 2021-10-16 ENCOUNTER — Inpatient Hospital Stay (HOSPITAL_BASED_OUTPATIENT_CLINIC_OR_DEPARTMENT_OTHER): Payer: Medicare Other | Admitting: Hematology

## 2021-10-16 ENCOUNTER — Encounter: Payer: Self-pay | Admitting: Hematology

## 2021-10-16 ENCOUNTER — Other Ambulatory Visit: Payer: Self-pay

## 2021-10-16 ENCOUNTER — Inpatient Hospital Stay: Payer: Medicare Other | Admitting: Dietician

## 2021-10-16 VITALS — BP 134/87 | HR 93 | Temp 97.7°F | Resp 18 | Ht 71.0 in | Wt 156.4 lb

## 2021-10-16 DIAGNOSIS — Z5111 Encounter for antineoplastic chemotherapy: Secondary | ICD-10-CM | POA: Diagnosis not present

## 2021-10-16 DIAGNOSIS — C259 Malignant neoplasm of pancreas, unspecified: Secondary | ICD-10-CM

## 2021-10-16 DIAGNOSIS — Z95828 Presence of other vascular implants and grafts: Secondary | ICD-10-CM

## 2021-10-16 LAB — CMP (CANCER CENTER ONLY)
ALT: 38 U/L (ref 0–44)
AST: 35 U/L (ref 15–41)
Albumin: 3.9 g/dL (ref 3.5–5.0)
Alkaline Phosphatase: 120 U/L (ref 38–126)
Anion gap: 6 (ref 5–15)
BUN: 10 mg/dL (ref 8–23)
CO2: 27 mmol/L (ref 22–32)
Calcium: 9.1 mg/dL (ref 8.9–10.3)
Chloride: 104 mmol/L (ref 98–111)
Creatinine: 0.75 mg/dL (ref 0.61–1.24)
GFR, Estimated: >60 mL/min (ref >60)
Glucose, Bld: 261 mg/dL — ABNORMAL HIGH (ref 70–99)
Potassium: 3.7 mmol/L (ref 3.5–5.1)
Sodium: 137 mmol/L (ref 135–145)
Total Bilirubin: 0.7 mg/dL (ref 0.3–1.2)
Total Protein: 6.7 g/dL (ref 6.5–8.1)

## 2021-10-16 LAB — CBC WITH DIFFERENTIAL (CANCER CENTER ONLY)
Abs Immature Granulocytes: 0.05 10*3/uL (ref 0.00–0.07)
Basophils Absolute: 0.1 10*3/uL (ref 0.0–0.1)
Basophils Relative: 1 %
Eosinophils Absolute: 0.2 10*3/uL (ref 0.0–0.5)
Eosinophils Relative: 3 %
HCT: 36 % — ABNORMAL LOW (ref 39.0–52.0)
Hemoglobin: 12.8 g/dL — ABNORMAL LOW (ref 13.0–17.0)
Immature Granulocytes: 1 %
Lymphocytes Relative: 22 %
Lymphs Abs: 1.4 10*3/uL (ref 0.7–4.0)
MCH: 34.5 pg — ABNORMAL HIGH (ref 26.0–34.0)
MCHC: 35.6 g/dL (ref 30.0–36.0)
MCV: 97 fL (ref 80.0–100.0)
Monocytes Absolute: 0.7 10*3/uL (ref 0.1–1.0)
Monocytes Relative: 10 %
Neutro Abs: 4.2 10*3/uL (ref 1.7–7.7)
Neutrophils Relative %: 63 %
Platelet Count: 116 10*3/uL — ABNORMAL LOW (ref 150–400)
RBC: 3.71 MIL/uL — ABNORMAL LOW (ref 4.22–5.81)
RDW: 15.6 % — ABNORMAL HIGH (ref 11.5–15.5)
WBC Count: 6.5 10*3/uL (ref 4.0–10.5)
nRBC: 0 % (ref 0.0–0.2)

## 2021-10-16 MED ORDER — SODIUM CHLORIDE 0.9% FLUSH: 10.0000 mL | INTRAVENOUS | Status: DC | PRN

## 2021-10-16 MED ORDER — PALONOSETRON HCL INJECTION 0.25 MG/5ML
0.2500 mg | Freq: Once | INTRAVENOUS | Status: AC
  Administered 2021-10-16: 0.25 mg via INTRAVENOUS

## 2021-10-16 MED ORDER — SODIUM CHLORIDE 0.9% FLUSH
10.0000 mL | Freq: Once | INTRAVENOUS | Status: AC
  Administered 2021-10-16: 10 mL

## 2021-10-16 MED ORDER — DEXAMETHASONE SODIUM PHOSPHATE 100 MG/10ML IJ SOLN
10.0000 mg | Freq: Once | INTRAMUSCULAR | Status: AC
  Administered 2021-10-16: 10 mg via INTRAVENOUS
  Filled 2021-10-16: qty 10

## 2021-10-16 MED ORDER — SODIUM CHLORIDE 0.9 % IV SOLN
150.0000 mg/m2 | Freq: Once | INTRAVENOUS | Status: AC
  Administered 2021-10-16: 280 mg via INTRAVENOUS
  Filled 2021-10-16: qty 14

## 2021-10-16 MED ORDER — SODIUM CHLORIDE 0.9 % IV SOLN
2200.0000 mg/m2 | INTRAVENOUS | Status: DC
  Administered 2021-10-16: 4200 mg via INTRAVENOUS
  Filled 2021-10-16: qty 84

## 2021-10-16 MED ORDER — DEXTROSE 5 % IV SOLN: Freq: Once | INTRAVENOUS | Status: AC

## 2021-10-16 MED ORDER — ATROPINE SULFATE 1 MG/ML IV SOLN
0.5000 mg | Freq: Once | INTRAVENOUS | Status: AC | PRN
  Administered 2021-10-16: 0.5 mg via INTRAVENOUS

## 2021-10-16 MED ORDER — ZOLPIDEM TARTRATE 10 MG PO TABS
10.0000 mg | ORAL_TABLET | Freq: Every evening | ORAL | 0 refills | Status: DC | PRN
Start: 2021-10-16 — End: 2021-11-19

## 2021-10-16 MED ORDER — OXALIPLATIN CHEMO INJECTION 100 MG/20ML
70.0000 mg/m2 | Freq: Once | INTRAVENOUS | Status: AC
  Administered 2021-10-16: 135 mg via INTRAVENOUS
  Filled 2021-10-16: qty 20

## 2021-10-16 MED ORDER — SODIUM CHLORIDE 0.9 % IV SOLN
400.0000 mg/m2 | Freq: Once | INTRAVENOUS | Status: AC
  Administered 2021-10-16: 764 mg via INTRAVENOUS
  Filled 2021-10-16: qty 25

## 2021-10-16 MED ORDER — SODIUM CHLORIDE 0.9 % IV SOLN
150.0000 mg | Freq: Once | INTRAVENOUS | Status: AC
  Administered 2021-10-16: 150 mg via INTRAVENOUS
  Filled 2021-10-16: qty 150

## 2021-10-16 NOTE — Progress Notes (Signed)
Nutrition Follow-up:  Patient receiving folfirinox for pancreatic cancer. Patient to receive dose reduced C5 today due to significant fatigue. He is followed by Dr. Burr Medico  Met with patient during infusion. He reports feeling good today. He reports appetite and energy level cycle with treatment. Patient reports he feels good and eats well until pump disconnect. Patient reports he experiences extreme fatigue, stays in bed and does not eat for 2 days. He will drink a little Gatorade. Patient reports usually eating breakfast and dinner meals. Patient skips lunch or midday snacks as this was typical for him prior to diagnosis. He is not a "regimented" person and and scheduled snacks/meals have been challenging. Patient will not think about making himself something or grabbing a snack if he is not hungry.    Medications: reviewed   Labs: Glucose 261  Anthropometrics: Weight 156 lb 6.4 oz today   158 lb 9.6 oz on 11/7 158 lb 9 oz on 10/24 164 lb 11.2 oz on 10/10 162 lb on 9/26 160 lb 5 oz on 9/9 198 lb on 12/05/20   NUTRITION DIAGNOSIS: Unintentional weight loss continues    INTERVENTION:  Encouraged having a variety of high calorie, high protein snacks available to him in different places (living room, bedroom) - handout with snack ideas provided Patient agrees to use Alexa for mid day "snack reminders"  Encouraged pt to try drinking oral nutrition supplements on "fatigued days" - samples of Ensure Complete and Dillard [place] provided Discussed blood glucose management, educated to include protein source with meals/snacks, reviewed signs of hypo/hyperglycemia - handouts provided Patient has contact information     MONITORING, EVALUATION, GOAL:  Weight trends, intake   NEXT VISIT: Monday, December 12 during infusion with Desmond Lope

## 2021-10-16 NOTE — Progress Notes (Signed)
[facility]   Telephone:([phone]) [phone] Fax:(336) [phone]   Clinic Follow up Note   Patient Care Team: [name], MD as PCP - General (Internal Medicine) [name], NP as PCP - Hematology/Oncology (Nurse Practitioner) [name], MD (Internal Medicine) [name], MD as Consulting Physician (Orthopedic Surgery) [name], MD as Consulting Physician (Cardiology) [name], MD as Consulting Physician (Oncology) Royston Bake, RN as Oncology Nurse Navigator (Oncology)  Date of Service:  10/16/2021  CHIEF COMPLAINT: f/u of metastatic pancreatic cancer  CURRENT THERAPY:  First line FOLFIRINOX, q2 weeks, starting 08/13/21  ASSESSMENT & PLAN:  Zachary Reid is a 65 y.o. male with   1. Metastatic pancreatic adenocarcinoma, [id number] with numerous small cavitary lesions in bilateral lungs, MMR normal  -found incidentally on recent CT scan for left kidney stone.  He does have some nonspecific symptoms, including epigastric pain, chronic right flank pain, and a dry cough.  He has had significant weight loss in the past several years. -He proceeded to colonoscopy and EUS on 07/26/21.  It demonstrated a solid-cystic mass in the genu-body of the pancreas, with endosonographic evidence of SMA abutment, T4N0. Cytology confirmed adenocarcinoma.  Baseline CA 19-9 elevated to 273 on 07/03/2021 -He began first-line mFOLFIRINOX on 08/13/2021, tolerated moderate well with severe fatigue for 3-7 days, constipation, and mild pancytopenia. He is able to recover well. GCSF added with cycle 2 -He asked about a break or some solution so he can have a good Christmas. I recommend dose reduction due to his significant fatigue for several days after pump DC. He is agreeable. If he again does not tolerate, we can cancel his next treatment. -we had another extensive discussion about a number of different things, including prognosis of unresectable pancreatic cancer vs metastatic  disease, other treatment options and clinical trials, diet supplements. He had many questions about alternative therapy such as vitC infusion, etc. -I ordered restaging CT to be done before his next treatment.   2. Anxiety, Insomnia -anxiety began 2022. He was started on lexapro on 04/19/21 and wellbutrin on 06/05/21 but came off them in August due to constipation -reported new anxiety attacks since diagnosis. Rx Xanax on 07/12/21, helpful.  -he notes he still has insomnia. Ambien prescribed 07/27/21, brand is more effective than generic. I refilled today. -F/up Dr. Michail Sermon   3. Genetic Testing -he reports pancreatic cancer in his mother, prostate cancer in his father, ovarian in PGM, and lung in PGF (smoker). -he proceeded with testing on 07/30/21. 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 Left kidney stone, right flank pain -f/u urology for kidney stone -He is on gabapentin 600 mg 3 times a day and CBD cream for his right flank pain, partially managed -tramadol was not very effective, achieved better pain management on oxycodone PRN. -we discussed pain management today. He notes he was previously not taking the tramadol every 6 hours due to fear of running out. I refilled for him, and he will take q6hr.   5. DM  -f/u with PCP     PLAN: -proceed with dose-reduced C5 FOLFIRINOX today due to severe fatigue, with GCSF on day 3 -I refilled ambien today -restaging CT to be done several days before 12/12 infusion -lab, flush, f/u and FOLFIRINOX 12/12 and 12/28             -with 1hr IVF on day 3 with  pump d/c, added to his chemo plan    No problem-specific Assessment & Plan notes found for this encounter.   SUMMARY OF ONCOLOGIC HISTORY: Oncology History  Pancreatic adenocarcinoma ([place])  07/02/2021 Initial Diagnosis   Pancreatic adenocarcinoma ([place])   07/26/2021 Cancer Staging   Staging form:  Exocrine Pancreas, AJCC 8th Edition - Clinical stage from 07/26/2021: Stage IV (cT4, cN0, cM1) - Signed by [name], MD on 07/28/2021 Stage prefix: Initial diagnosis Total positive nodes: 0    08/13/2021 -  Chemotherapy   Patient is on Treatment Plan : PANCREAS Modified FOLFIRINOX q14d x 4 cycles      Genetic Testing   [facility] CancerNext-Expanded results (77 genes) were negative. No pathogenic variants were identified. 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 The report date is 08/15/2021.    The CancerNext-Expanded gene panel offered by [facility] and includes sequencing, rearrangement, and RNA analysis for the following 77 genes: AIP, ALK, APC, ATM, AXIN2, BAP1, BARD1, BLM, BMPR1A, BRCA1, BRCA2, BRIP1, CDC73, CDH1, CDK4, CDKN1B, CDKN2A, CHEK2, CTNNA1, DICER1, FANCC, FH, FLCN, GALNT12, KIF1B, LZTR1, MAX, MEN1, MET, MLH1, MSH2, MSH3, MSH6, MUTYH, NBN, NF1, NF2, NTHL1, PALB2, PHOX2B, PMS2, POT1, PRKAR1A, PTCH1, PTEN, RAD51C, RAD51D, RB1, RECQL, RET, SDHA, SDHAF2, SDHB, SDHC, SDHD, SMAD4, SMARCA4, SMARCB1, SMARCE1, STK11, SUFU, TMEM127, TP53, TSC1, TSC2, VHL and XRCC2 (sequencing and deletion/duplication); EGFR, EGLN1, HOXB13, KIT, MITF, PDGFRA, POLD1, and POLE (sequencing only); EPCAM and GREM1 (deletion/duplication only).        INTERVAL HISTORY:  Zachary Reid is here for a follow up of metastatic pancreatic cancer. He was last seen by me on 09/24/21. He presents to the clinic accompanied by his wife. He reports good news and bad news. The good notes-- he is sleeping well with the Ambien, eating well during his off week, and his pain is overall controlled. The bad notes-- he struggles with extreme fatigue for two to three days after pump DC, including difficulty getting out of bed, eating, or drinking and leading to a bad mental space. He also questioned whether his diabetes plays a role in his tolerance of chemo. He had many additional questions about different  things-- supplements (high-dose vit C infusion, "[place] tail extract"), metastatic disease (disease in his lung, etc), and clinical trials (in-state, out-of-state, etc)   All other systems were reviewed with the patient and are negative.  MEDICAL HISTORY:  Past Medical History:  Diagnosis Date   Abnormal EKG    DM (diabetes mellitus) ([place])    Hyperlipidemia    Hypogonadism male    IBS (irritable bowel syndrome)    Obesity    BMI 31   pancreatic ca 06/28/2021   Vitamin D deficiency     SURGICAL HISTORY: Past Surgical History:  Procedure Laterality Date   APPENDECTOMY     BIOPSY  07/26/2021   Procedure: BIOPSY;  Surgeon: Irving Copas., MD;  Location: [facility];  Service: Gastroenterology;;   COLONOSCOPY WITH PROPOFOL N/A 07/26/2021   Procedure: COLONOSCOPY WITH PROPOFOL;  Surgeon: Irving Copas., MD;  Location: [facility];  Service: Gastroenterology;  Laterality: N/A;   ESOPHAGOGASTRODUODENOSCOPY (EGD) WITH PROPOFOL N/A 07/26/2021   Procedure: ESOPHAGOGASTRODUODENOSCOPY (EGD) WITH PROPOFOL;  Surgeon: Rush Landmark Telford Nab., MD;  Location: [facility];  Service: Gastroenterology;  Laterality: N/A;   EUS N/A 07/26/2021   Procedure: UPPER ENDOSCOPIC ULTRASOUND (EUS) RADIAL;  Surgeon: Irving Copas., MD;  Location: [facility];  Service: Gastroenterology;  Laterality: N/A;   FINE NEEDLE ASPIRATION  07/26/2021   Procedure: FINE NEEDLE ASPIRATION (FNA) LINEAR;  Surgeon: Rush Landmark Telford Nab., MD;  Location: [facility];  Service: Gastroenterology;;   IR IMAGING GUIDED PORT INSERTION  08/10/2021   POLYPECTOMY  07/26/2021   Procedure: POLYPECTOMY;  Surgeon: Rush Landmark Telford Nab., MD;  Location: [facility];  Service: Gastroenterology;;   SHOULDER SURGERY Right     I have reviewed the social history and family history with the patient and they are unchanged from previous note.  ALLERGIES:  is allergic to lipitor [atorvastatin].  MEDICATIONS:  Current  Outpatient Medications  Medication Sig Dispense Refill   ALPRAZolam (XANAX) 0.25 MG tablet Take 1-2 tablets (0.25-0.5 mg total) by mouth 2 (two) times daily as needed for anxiety. 60 tablet 0   aspirin EC 81 MG tablet Take 81 mg by mouth in the morning and at bedtime. Swallow whole.     buPROPion (WELLBUTRIN XL) 300 MG 24 hr tablet Take 1 tablet every Morning for Mood, Focus & Concentration 90 tablet 3   Cholecalciferol (VITAMIN D3) 125 MCG (5000 UT) CAPS Take 5,000 Units by mouth in the morning and at bedtime.     dexamethasone (DECADRON) 4 MG tablet Take 1 tablet by mouth in the morning for 3-5 days after chemo, starting after pump disconnect, for nausea, low appetite, and fatigue 20 tablet 1   dronabinol (MARINOL) 5 MG capsule Take 1 capsule (5 mg total) by mouth 2 (two) times daily before a meal. 60 capsule 0   escitalopram (LEXAPRO) 20 MG tablet Take 1 tablet  Daily  for Mood & Chronic Anxiety 90 tablet 3   gabapentin (NEURONTIN) 600 MG tablet Take 1 tablet (600 mg total) by mouth 3 (three) times daily. 90 tablet 1   glipiZIDE (GLUCOTROL) 5 MG tablet TAKE 1 TABLET BY MOUTH 2 TO 3 TIMES /DAY WITH MEALS FOR DIABETES (Patient taking differently: Take 10 mg by mouth in the morning, at noon, and at bedtime.) 270 tablet 1   glucose blood test strip Use as instructed 100 each 12   hydrocortisone (ANUSOL-HC) 25 MG suppository Place 1 suppository (25 mg total) rectally at bedtime. QHS x 1-week and then Every Other night until prescription complete 12 suppository 0   hyoscyamine (LEVSIN SL) 0.125 MG SL tablet DISSOLVE ONE TABLET UNDER THE TONGUE 4 TIMES DAILY UP TO EVERY 4 HOURS AS NEEDED FOR NAUSEA, BLOATING, CRAMPING, OR DIARRHEA 100 tablet 0   lidocaine-prilocaine (EMLA) cream Apply to affected area once 30 g 3   metFORMIN (GLUCOPHAGE-XR) 500 MG 24 hr tablet Take 500 mg by mouth in the morning, at noon, and at bedtime.     ondansetron (ZOFRAN ODT) 4 MG disintegrating tablet Take 1 tablet (4 mg total)  by mouth every 8 (eight) hours as needed for nausea or vomiting. 20 tablet 0   ondansetron (ZOFRAN) 8 MG tablet Take 1 tablet (8 mg total) by mouth 2 (two) times daily as needed. Start on day 3 after chemotherapy. 30 tablet 1   oxyCODONE (ROXICODONE) 5 MG immediate release tablet Take 1 tablet (5 mg total) by mouth every 6 (six) hours as needed for up to 6 doses for severe pain. 30 tablet 0   prochlorperazine (COMPAZINE) 10 MG tablet Take 1 tablet (10 mg total) by mouth every 6 (six) hours as needed (Nausea or vomiting). 30 tablet 1   Red Yeast Rice 600 MG TABS Take 300 mg by mouth daily.     simvastatin (ZOCOR) 80 MG tablet Take 1 tablet (80 mg total)  by mouth daily. 90 tablet 1   traMADol (ULTRAM) 50 MG tablet Take 1 tablet (50 mg total) by mouth every 6 (six) hours as needed. 90 tablet 0   traZODone (DESYREL) 150 MG tablet TAKE 1/2 TO 1 TABLET 1 HOUR BEFORE BEDTIME FOR SLEEP 90 tablet 1   zolpidem (AMBIEN) 10 MG tablet Take 0.5-1 tablets (5-10 mg total) by mouth at bedtime as needed for sleep. 30 tablet 0   zolpidem (AMBIEN) 10 MG tablet Take 1 tablet (10 mg total) by mouth at bedtime as needed for sleep. 30 tablet 0   No current facility-administered medications for this visit.    PHYSICAL EXAMINATION: ECOG PERFORMANCE STATUS: 1 - Symptomatic but completely ambulatory  Vitals:   10/16/21 0856  BP: 134/87  Pulse: 93  Resp: 18  Temp: 97.7 F (36.5 C)  SpO2: 99%   Wt Readings from Last 3 Encounters:  10/16/21 156 lb 6.4 oz (70.9 kg)  09/24/21 158 lb 9.6 oz (71.9 kg)  09/10/21 158 lb 9 oz (71.9 kg)     GENERAL:alert, no distress and comfortable SKIN: skin color normal, no rashes or significant lesions EYES: normal, Conjunctiva are pink and non-injected, sclera clear  NEURO: alert & oriented x 3 with fluent speech  LABORATORY DATA:  I have reviewed the data as listed CBC Latest Ref Rng & Units 10/16/2021 09/24/2021 09/10/2021  WBC 4.0 - 10.5 K/uL 6.5 16.4(H) 8.3  Hemoglobin  13.0 - 17.0 g/dL 12.8(L) 12.4(L) 11.7(L)  Hematocrit 39.0 - 52.0 % 36.0(L) 35.4(L) 33.1(L)  Platelets 150 - 400 K/uL 116(L) 139(L) 102(L)     CMP Latest Ref Rng & Units 10/16/2021 09/24/2021 09/10/2021  Glucose 70 - 99 mg/dL 261(H) 310(H) 288(H)  BUN 8 - 23 mg/dL $Remove'10 10 12  'gYchBZP$ Creatinine 0.61 - 1.24 mg/dL 0.75 0.86 0.81  Sodium 135 - 145 mmol/L 137 139 136  Potassium 3.5 - 5.1 mmol/L 3.7 3.6 3.6  Chloride 98 - 111 mmol/L 104 105 102  CO2 22 - 32 mmol/L $RemoveB'27 25 26  'NiPqkEsb$ Calcium 8.9 - 10.3 mg/dL 9.1 8.8(L) 8.6(L)  Total Protein 6.5 - 8.1 g/dL 6.7 6.2(L) 6.4(L)  Total Bilirubin 0.3 - 1.2 mg/dL 0.7 0.6 0.6  Alkaline Phos 38 - 126 U/L 120 172(H) 92  AST 15 - 41 U/L 35 21 19  ALT 0 - 44 U/L 38 28 22      RADIOGRAPHIC STUDIES: I have personally reviewed the radiological images as listed and agreed with the findings in the report. No results found.    Orders Placed This Encounter  Procedures   CT CHEST ABDOMEN PELVIS W CONTRAST    Standing Status:   Future    Standing Expiration Date:   10/16/2022    Order Specific Question:   Preferred imaging location?    Answer:   [facility]    Order Specific Question:   Release to patient    Answer:   Immediate    Order Specific Question:   Is Oral Contrast requested for this exam?    Answer:   Yes, Per Radiology protocol   All questions were answered. The patient knows to call the clinic with any problems, questions or concerns. No barriers to learning was detected. The total time spent in the appointment was 40 minutes.     [name], MD 10/16/2021   I, Wilburn Mylar, am acting as scribe for [name], MD.   I have reviewed the above documentation for accuracy and completeness, and I agree  with the above.     

## 2021-10-16 NOTE — Progress Notes (Signed)
[facility] Spiritual Care Note  Attempted follow-up visit in infusion, but Zachary Reid was sleeping. Will try again Thursday.   [place], [place], [facility] Pager [phone] Voicemail [phone]

## 2021-10-16 NOTE — Patient Instructions (Signed)
[facility] ONCOL[facility]  Discharge Instructions: Thank you for choosing [facility] to provide your oncology and hematology care.   If you have a lab appointment with the [facility], please go directly to the [facility] and check in at the registration area.   Wear comfortable clothing and clothing appropriate for easy access to any Portacath or PICC line.   We strive to give you quality time with your provider. You may need to reschedule your appointment if you arrive late (15 or more minutes).  Arriving late affects you and other patients whose appointments are after yours.  Also, if you miss three or more appointments without notifying the office, you may be dismissed from the clinic at the provider's discretion.      For prescription refill requests, have your pharmacy contact our office and allow 72 hours for refills to be completed.    Today you received the following chemotherapy and/or immunotherapy agents FOLFIRINOX      To help prevent nausea and vomiting after your treatment, we encourage you to take your nausea medication as directed.  BELOW ARE SYMPTOMS THAT SHOULD BE REPORTED IMMEDIATELY: *FEVER GREATER THAN 100.4 F (38 C) OR HIGHER *CHILLS OR SWEATING *NAUSEA AND VOMITING THAT IS NOT CONTROLLED WITH YOUR NAUSEA MEDICATION *UNUSUAL SHORTNESS OF BREATH *UNUSUAL BRUISING OR BLEEDING *URINARY PROBLEMS (pain or burning when urinating, or frequent urination) *BOWEL PROBLEMS (unusual diarrhea, constipation, pain near the anus) TENDERNESS IN MOUTH AND THROAT WITH OR WITHOUT PRESENCE OF ULCERS (sore throat, sores in mouth, or a toothache) UNUSUAL RASH, SWELLING OR PAIN  UNUSUAL VAGINAL DISCHARGE OR ITCHING   Items with * indicate a potential emergency and should be followed up as soon as possible or go to the Emergency Department if any problems should occur.  Please show the CHEMOTHERAPY ALERT CARD or IMMUNOTHERAPY ALERT CARD at check-in to  the Emergency Department and triage nurse.  Should you have questions after your visit or need to cancel or reschedule your appointment, please contact [facility]  Dept: [phone]  and follow the prompts.  Office hours are 8:00 a.m. to 4:30 p.m. Monday - Friday. Please note that voicemails left after 4:00 p.m. may not be returned until the following business day.  We are closed weekends and major holidays. You have access to a nurse at all times for urgent questions. Please call the main number to the clinic Dept: [phone] and follow the prompts.   For any non-urgent questions, you may also contact your provider using MyChart. We now offer e-Visits for anyone 46 and older to request care online for non-urgent symptoms. For details visit [email or web address].   Also download the MyChart app! Go to the app store, search "MyChart", open the app, select [facility], and log in with your MyChart username and password.  Due to Covid, a mask is required upon entering the hospital/clinic. If you do not have a mask, one will be given to you upon arrival. For doctor visits, patients may have 1 support person aged 67 or older with them. For treatment visits, patients cannot have anyone with them due to current Covid guidelines and our immunocompromised population.

## 2021-10-17 LAB — CANCER ANTIGEN 19-9: CA 19-9: 170 U/mL — ABNORMAL HIGH (ref 0–35)

## 2021-10-18 ENCOUNTER — Inpatient Hospital Stay: Payer: Medicare Other | Attending: Hematology

## 2021-10-18 ENCOUNTER — Other Ambulatory Visit: Payer: Self-pay

## 2021-10-18 ENCOUNTER — Encounter: Payer: Self-pay | Admitting: Hematology

## 2021-10-18 VITALS — HR 78 | Temp 98.2°F | Resp 18

## 2021-10-18 DIAGNOSIS — Z7982 Long term (current) use of aspirin: Secondary | ICD-10-CM | POA: Insufficient documentation

## 2021-10-18 DIAGNOSIS — E119 Type 2 diabetes mellitus without complications: Secondary | ICD-10-CM | POA: Insufficient documentation

## 2021-10-18 DIAGNOSIS — Z5189 Encounter for other specified aftercare: Secondary | ICD-10-CM | POA: Insufficient documentation

## 2021-10-18 DIAGNOSIS — C259 Malignant neoplasm of pancreas, unspecified: Secondary | ICD-10-CM

## 2021-10-18 DIAGNOSIS — E669 Obesity, unspecified: Secondary | ICD-10-CM | POA: Diagnosis not present

## 2021-10-18 DIAGNOSIS — Z8042 Family history of malignant neoplasm of prostate: Secondary | ICD-10-CM | POA: Diagnosis not present

## 2021-10-18 DIAGNOSIS — G4700 Insomnia, unspecified: Secondary | ICD-10-CM | POA: Insufficient documentation

## 2021-10-18 DIAGNOSIS — Z23 Encounter for immunization: Secondary | ICD-10-CM | POA: Insufficient documentation

## 2021-10-18 DIAGNOSIS — I700 Atherosclerosis of aorta: Secondary | ICD-10-CM | POA: Diagnosis not present

## 2021-10-18 DIAGNOSIS — Z7984 Long term (current) use of oral hypoglycemic drugs: Secondary | ICD-10-CM | POA: Insufficient documentation

## 2021-10-18 DIAGNOSIS — K7689 Other specified diseases of liver: Secondary | ICD-10-CM | POA: Insufficient documentation

## 2021-10-18 DIAGNOSIS — Z8041 Family history of malignant neoplasm of ovary: Secondary | ICD-10-CM | POA: Diagnosis not present

## 2021-10-18 DIAGNOSIS — Z800 Family history of malignant neoplasm of digestive organs: Secondary | ICD-10-CM | POA: Insufficient documentation

## 2021-10-18 DIAGNOSIS — N200 Calculus of kidney: Secondary | ICD-10-CM | POA: Diagnosis not present

## 2021-10-18 DIAGNOSIS — E785 Hyperlipidemia, unspecified: Secondary | ICD-10-CM | POA: Diagnosis not present

## 2021-10-18 DIAGNOSIS — Z801 Family history of malignant neoplasm of trachea, bronchus and lung: Secondary | ICD-10-CM | POA: Diagnosis not present

## 2021-10-18 DIAGNOSIS — C251 Malignant neoplasm of body of pancreas: Secondary | ICD-10-CM | POA: Insufficient documentation

## 2021-10-18 DIAGNOSIS — F419 Anxiety disorder, unspecified: Secondary | ICD-10-CM | POA: Diagnosis not present

## 2021-10-18 DIAGNOSIS — R918 Other nonspecific abnormal finding of lung field: Secondary | ICD-10-CM | POA: Insufficient documentation

## 2021-10-18 DIAGNOSIS — Z79899 Other long term (current) drug therapy: Secondary | ICD-10-CM | POA: Diagnosis not present

## 2021-10-18 MED ORDER — SODIUM CHLORIDE 0.9 % IV SOLN: INTRAVENOUS | Status: AC

## 2021-10-18 MED ORDER — SODIUM CHLORIDE 0.9% FLUSH: 10.0000 mL | INTRAVENOUS | Status: DC | PRN

## 2021-10-18 MED ORDER — PEGFILGRASTIM-CBQV 6 MG/0.6ML ~~LOC~~ SOSY
6.0000 mg | PREFILLED_SYRINGE | Freq: Once | SUBCUTANEOUS | Status: AC
  Administered 2021-10-18: 6 mg via SUBCUTANEOUS
  Filled 2021-10-18: qty 0.6

## 2021-10-18 MED ORDER — HEPARIN SOD (PORK) LOCK FLUSH 100 UNIT/ML IV SOLN: 500.0000 [IU] | Freq: Once | INTRAVENOUS | Status: DC | PRN

## 2021-10-19 ENCOUNTER — Telehealth: Payer: Self-pay

## 2021-10-19 NOTE — Telephone Encounter (Signed)
This nurse reached out to patient to verify that he is aware of Scan appointment of Thursday 10/25/2021 at 430 pm.  Provided prep instructions.  Patient acknowledges understanding and hs no further questions or concerns at this time.

## 2021-10-24 ENCOUNTER — Encounter: Payer: Self-pay | Admitting: Hematology

## 2021-10-25 ENCOUNTER — Ambulatory Visit (HOSPITAL_COMMUNITY)
Admission: RE | Admit: 2021-10-25 | Discharge: 2021-10-25 | Disposition: A | Discharge status: Home / Self Care | Payer: Medicare Other | Source: Ambulatory Visit | Attending: Hematology | Admitting: Hematology

## 2021-10-25 ENCOUNTER — Encounter: Payer: Self-pay | Admitting: Hematology

## 2021-10-25 ENCOUNTER — Other Ambulatory Visit: Payer: Self-pay

## 2021-10-25 DIAGNOSIS — C259 Malignant neoplasm of pancreas, unspecified: Secondary | ICD-10-CM | POA: Insufficient documentation

## 2021-10-25 MED ORDER — IOHEXOL 350 MG/ML SOLN
80.0000 mL | Freq: Once | INTRAVENOUS | Status: AC | PRN
  Administered 2021-10-25: 80 mL via INTRAVENOUS

## 2021-10-25 MED ORDER — SODIUM CHLORIDE (PF) 0.9 % IJ SOLN
INTRAMUSCULAR | Status: AC
  Filled 2021-10-25: qty 50

## 2021-10-26 MED FILL — Dexamethasone Sodium Phosphate Inj 100 MG/10ML: INTRAMUSCULAR | Qty: 1 | Status: AC

## 2021-10-26 MED FILL — Fosaprepitant Dimeglumine For IV Infusion 150 MG (Base Eq): INTRAVENOUS | Qty: 5 | Status: AC

## 2021-10-27 ENCOUNTER — Encounter: Payer: Self-pay | Admitting: Hematology

## 2021-10-29 ENCOUNTER — Inpatient Hospital Stay: Payer: Medicare Other

## 2021-10-29 ENCOUNTER — Other Ambulatory Visit: Payer: Self-pay

## 2021-10-29 ENCOUNTER — Encounter: Payer: Self-pay | Admitting: Hematology

## 2021-10-29 ENCOUNTER — Inpatient Hospital Stay (HOSPITAL_BASED_OUTPATIENT_CLINIC_OR_DEPARTMENT_OTHER): Payer: Medicare Other | Admitting: Hematology

## 2021-10-29 VITALS — BP 144/83 | HR 81 | Temp 98.5°F | Resp 18 | Ht 71.0 in | Wt 159.0 lb

## 2021-10-29 DIAGNOSIS — C251 Malignant neoplasm of body of pancreas: Secondary | ICD-10-CM | POA: Diagnosis not present

## 2021-10-29 DIAGNOSIS — C259 Malignant neoplasm of pancreas, unspecified: Secondary | ICD-10-CM

## 2021-10-29 DIAGNOSIS — Z95828 Presence of other vascular implants and grafts: Secondary | ICD-10-CM

## 2021-10-29 DIAGNOSIS — Z23 Encounter for immunization: Secondary | ICD-10-CM

## 2021-10-29 LAB — CBC WITH DIFFERENTIAL (CANCER CENTER ONLY)
Abs Immature Granulocytes: 0.08 10*3/uL — ABNORMAL HIGH (ref 0.00–0.07)
Basophils Absolute: 0.1 10*3/uL (ref 0.0–0.1)
Basophils Relative: 1 %
Eosinophils Absolute: 0.3 10*3/uL (ref 0.0–0.5)
Eosinophils Relative: 2 %
HCT: 33.7 % — ABNORMAL LOW (ref 39.0–52.0)
Hemoglobin: 11.8 g/dL — ABNORMAL LOW (ref 13.0–17.0)
Immature Granulocytes: 1 %
Lymphocytes Relative: 11 %
Lymphs Abs: 1.4 10*3/uL (ref 0.7–4.0)
MCH: 34.4 pg — ABNORMAL HIGH (ref 26.0–34.0)
MCHC: 35 g/dL (ref 30.0–36.0)
MCV: 98.3 fL (ref 80.0–100.0)
Monocytes Absolute: 0.7 10*3/uL (ref 0.1–1.0)
Monocytes Relative: 6 %
Neutro Abs: 10.1 10*3/uL — ABNORMAL HIGH (ref 1.7–7.7)
Neutrophils Relative %: 79 %
Platelet Count: 90 10*3/uL — ABNORMAL LOW (ref 150–400)
RBC: 3.43 MIL/uL — ABNORMAL LOW (ref 4.22–5.81)
RDW: 15.1 % (ref 11.5–15.5)
WBC Count: 12.7 10*3/uL — ABNORMAL HIGH (ref 4.0–10.5)
nRBC: 0 % (ref 0.0–0.2)

## 2021-10-29 LAB — CMP (CANCER CENTER ONLY)
ALT: 23 U/L (ref 0–44)
AST: 21 U/L (ref 15–41)
Albumin: 3.5 g/dL (ref 3.5–5.0)
Alkaline Phosphatase: 159 U/L — ABNORMAL HIGH (ref 38–126)
Anion gap: 10 (ref 5–15)
BUN: 9 mg/dL (ref 8–23)
CO2: 23 mmol/L (ref 22–32)
Calcium: 8.6 mg/dL — ABNORMAL LOW (ref 8.9–10.3)
Chloride: 107 mmol/L (ref 98–111)
Creatinine: 0.86 mg/dL (ref 0.61–1.24)
GFR, Estimated: >60 mL/min (ref >60)
Glucose, Bld: 320 mg/dL — ABNORMAL HIGH (ref 70–99)
Potassium: 3.6 mmol/L (ref 3.5–5.1)
Sodium: 140 mmol/L (ref 135–145)
Total Bilirubin: 0.5 mg/dL (ref 0.3–1.2)
Total Protein: 6.3 g/dL — ABNORMAL LOW (ref 6.5–8.1)

## 2021-10-29 MED ORDER — INFLUENZA VAC A&B SA ADJ QUAD 0.5 ML IM PRSY
0.5000 mL | PREFILLED_SYRINGE | Freq: Once | INTRAMUSCULAR | Status: AC
  Administered 2021-10-29: 0.5 mL via INTRAMUSCULAR
  Filled 2021-10-29: qty 0.5

## 2021-10-29 MED ORDER — SODIUM CHLORIDE 0.9% FLUSH
10.0000 mL | Freq: Once | INTRAVENOUS | Status: AC
  Administered 2021-10-29: 10 mL

## 2021-10-29 NOTE — Progress Notes (Signed)
[place]   [phone] [phone]) [phone]   Clinic Follow up Note   Patient Care Team: [name], MD as PCP - General (Internal Medicine) [name], NP as PCP - Hematology/Oncology (Nurse Practitioner) [name], MD (Internal Medicine) [name], MD as Consulting Physician (Orthopedic Surgery) [name], MD as Consulting Physician (Cardiology) [name], MD as Consulting Physician (Oncology) Royston Bake, RN as Oncology Nurse Navigator (Oncology)  Date of Service:  10/29/2021  CHIEF COMPLAINT: f/u of metastatic pancreatic cancer  CURRENT THERAPY:  First line FOLFIRINOX, q2 weeks, starting 08/13/21  ASSESSMENT & PLAN:  Zachary Reid is a 65 y.o. male with   1. Metastatic pancreatic adenocarcinoma, [id number] with numerous small cavitary lesions in bilateral lungs, MMR normal  -found incidentally on recent CT scan for left kidney stone.  He does have some nonspecific symptoms, including epigastric pain, chronic right flank pain, and a dry cough.  He has had significant weight loss in the past several years. -He proceeded to colonoscopy and EUS on 07/26/21.  It demonstrated a solid-cystic mass in the genu-body of the pancreas, with endosonographic evidence of SMA abutment, T4N0. Cytology confirmed adenocarcinoma.  Baseline CA 19-9 elevated to 273 on 07/03/21 -He began first-line mFOLFIRINOX on 08/13/2021, tolerated moderate well with severe fatigue for 3-7 days, constipation, and mild pancytopenia. He is able to recover well. GCSF added with cycle 2 -restaging CT CAP 10/25/21 showed overall stable disease. His numerous lung nodules with cavitation are less solid on images which show response to treatment, his primary pancreatic tumor is also slightly smaller.  I reviewed the results and images with them today. -he would like to cancel his treatment today in order to travel to take care of his sister. He would like to restart at the  start of the year.   2. Anxiety, Insomnia -anxiety began 2022. He was started on lexapro on 04/19/21 and wellbutrin on 06/05/21 but came off them in August due to constipation -reported new anxiety attacks since diagnosis. Rx Xanax on 07/12/21, helpful.  -he notes he still has insomnia. Ambien prescribed 07/27/21, brand is more effective than generic. I refilled today. -F/up Dr. Michail Sermon   3. Genetic Testing -he reports pancreatic cancer in his mother, prostate cancer in his father, ovarian in PGM, and lung in PGF (smoker). -he proceeded with testing on 07/30/21. 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 Left kidney stone, right flank pain -f/u urology for kidney stone -He is on gabapentin 600 mg 3 times a day and CBD cream for his right flank pain, partially managed -tramadol was not very effective, achieved better pain management on oxycodone PRN. -I encouraged him to f/u with his urologist.   5. DM  -f/u with PCP     PLAN: -lab and scan reviewed, overall SD with slight improvement -cancel treatment today per patient request. -move lab, flush, f/u and FOLFIRINOX appointments on 12/28 and 12/30 to the week of 11/19/21, per pt request   No problem-specific Assessment & Plan notes found for this encounter.   SUMMARY OF ONCOLOGIC HISTORY: Oncology History Overview Note   Cancer Staging  Pancreatic adenocarcinoma [facility]) Staging form: Exocrine Pancreas, AJCC 8th Edition - Clinical stage from 07/26/2021: Stage IV (cT4, cN0, cM1) - Signed by [name], MD on 07/28/2021    Pancreatic adenocarcinoma ([place])  07/02/2021 Initial Diagnosis   Pancreatic adenocarcinoma ([place])  07/26/2021 Cancer Staging   Staging form: Exocrine Pancreas, AJCC 8th Edition - Clinical stage from 07/26/2021: Stage IV (cT4, cN0, cM1) - Signed by Malachy Mood, MD on 07/28/2021 Stage prefix: Initial diagnosis Total positive nodes: 0     08/13/2021 -  Chemotherapy   Patient is on Treatment Plan : PANCREAS Modified FOLFIRINOX q14d x 4 cycles      Genetic Testing   [facility] CancerNext-Expanded results (77 genes) were negative. No pathogenic variants were identified. 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 The report date is 08/15/2021.    The CancerNext-Expanded gene panel offered by [facility] and includes sequencing, rearrangement, and RNA analysis for the following 77 genes: AIP, ALK, APC, ATM, AXIN2, BAP1, BARD1, BLM, BMPR1A, BRCA1, BRCA2, BRIP1, CDC73, CDH1, CDK4, CDKN1B, CDKN2A, CHEK2, CTNNA1, DICER1, FANCC, FH, FLCN, GALNT12, KIF1B, LZTR1, MAX, MEN1, MET, MLH1, MSH2, MSH3, MSH6, MUTYH, NBN, NF1, NF2, NTHL1, PALB2, PHOX2B, PMS2, POT1, PRKAR1A, PTCH1, PTEN, RAD51C, RAD51D, RB1, RECQL, RET, SDHA, SDHAF2, SDHB, SDHC, SDHD, SMAD4, SMARCA4, SMARCB1, SMARCE1, STK11, SUFU, TMEM127, TP53, TSC1, TSC2, VHL and XRCC2 (sequencing and deletion/duplication); EGFR, EGLN1, HOXB13, KIT, MITF, PDGFRA, POLD1, and POLE (sequencing only); EPCAM and GREM1 (deletion/duplication only).     10/25/2021 Imaging   EXAM: CT CHEST, ABDOMEN, AND PELVIS WITH CONTRAST  IMPRESSION: 1. Innumerable bilateral pulmonary nodules, many of which are cavitary, consistent with metastatic disease. These nodules show no substantial change in are minimally progressed in the interval. 2. Mix cystic and solid lesion in the head and body of the pancreas is similar to prior and also comparing back to MRI 07/02/2021. 3. Hepatic cysts. 4. 8 mm nonobstructing left renal stone. 5. Aortic Atherosclerosis (ICD10-I70.0).      INTERVAL HISTORY:  Zachary Reid is here for a follow up of metastatic pancreatic cancer. He was last seen by me on 10/16/21. He presents to the clinic accompanied by his wife. His wife reports he has a cough that has been ongoing for a long time. He describes it as "dry with mucous." I advised him to try flonase or  other allergy medications.   All other systems were reviewed with the patient and are negative.  MEDICAL HISTORY:  Past Medical History:  Diagnosis Date   Abnormal EKG    DM (diabetes mellitus) (HCC)    Hyperlipidemia    Hypogonadism male    IBS (irritable bowel syndrome)    Obesity    BMI 31   pancreatic ca 06/28/2021   Vitamin D deficiency     SURGICAL HISTORY: Past Surgical History:  Procedure Laterality Date   APPENDECTOMY     BIOPSY  07/26/2021   Procedure: BIOPSY;  Surgeon: Lemar Lofty., MD;  Location[facility] ENDOSCOPY;  Service: Gastroenterology;;   COLONOSCOPY WITH PROPOFOL N/A 07/26/2021   Procedure: COLONOSCOPY WITH PROPOFOL;  Surgeon: Lemar Lofty., MD;  Location[facility] ENDOSCOPY;  Service: Gastroenterology;  Laterality: N/A;   ESOPHAGOGASTRODUODENOSCOPY (EGD) WITH PROPOFOL N/A 07/26/2021   Procedure: ESOPHAGOGASTRODUODENOSCOPY (EGD) WITH PROPOFOL;  Surgeon: Meridee Score Netty Starring., MD;  Location: [facility] ENDOSCOPY;  Service: Gastroenterology;  Laterality: N/A;   EUS N/A 07/26/2021   Procedure: UPPER ENDOSCOPIC ULTRASOUND (EUS) RADIAL;  Surgeon: Lemar Lofty., MD;  Location: [facility] ENDOSCOPY;  Service: Gastroenterology;  Laterality: N/A;   FINE NEEDLE ASPIRATION  07/26/2021   Procedure: FINE NEEDLE ASPIRATION (FNA) LINEAR;  Surgeon: Lemar Lofty., MD;  Location: [facility] ENDOSCOPY;  Service: Gastroenterology;;   IR IMAGING GUIDED PORT INSERTION  08/10/2021  POLYPECTOMY  07/26/2021   Procedure: POLYPECTOMY;  Surgeon: Mansouraty, Telford Nab., MD;  Location: [place];  Service: Gastroenterology;;   SHOULDER SURGERY Right     I have reviewed the social history and family history with the patient and they are unchanged from previous note.  ALLERGIES:  is allergic to lipitor [atorvastatin].  MEDICATIONS:  Current Outpatient Medications  Medication Sig Dispense Refill   ALPRAZolam (XANAX) 0.25 MG tablet Take 1-2 tablets (0.25-0.5 mg total) by mouth 2 (two)  times daily as needed for anxiety. 60 tablet 0   aspirin EC 81 MG tablet Take 81 mg by mouth in the morning and at bedtime. Swallow whole.     buPROPion (WELLBUTRIN XL) 300 MG 24 hr tablet Take 1 tablet every Morning for Mood, Focus & Concentration 90 tablet 3   Cholecalciferol (VITAMIN D3) 125 MCG (5000 UT) CAPS Take 5,000 Units by mouth in the morning and at bedtime.     dexamethasone (DECADRON) 4 MG tablet Take 1 tablet by mouth in the morning for 3-5 days after chemo, starting after pump disconnect, for nausea, low appetite, and fatigue 20 tablet 1   dronabinol (MARINOL) 5 MG capsule Take 1 capsule (5 mg total) by mouth 2 (two) times daily before a meal. 60 capsule 0   escitalopram (LEXAPRO) 20 MG tablet Take 1 tablet  Daily  for Mood & Chronic Anxiety 90 tablet 3   gabapentin (NEURONTIN) 600 MG tablet Take 1 tablet (600 mg total) by mouth 3 (three) times daily. 90 tablet 1   glipiZIDE (GLUCOTROL) 5 MG tablet TAKE 1 TABLET BY MOUTH 2 TO 3 TIMES /DAY WITH MEALS FOR DIABETES (Patient taking differently: Take 10 mg by mouth in the morning, at noon, and at bedtime.) 270 tablet 1   glucose blood test strip Use as instructed 100 each 12   hydrocortisone (ANUSOL-HC) 25 MG suppository Place 1 suppository (25 mg total) rectally at bedtime. QHS x 1-week and then Every Other night until prescription complete 12 suppository 0   hyoscyamine (LEVSIN SL) 0.125 MG SL tablet DISSOLVE ONE TABLET UNDER THE TONGUE 4 TIMES DAILY UP TO EVERY 4 HOURS AS NEEDED FOR NAUSEA, BLOATING, CRAMPING, OR DIARRHEA 100 tablet 0   lidocaine-prilocaine (EMLA) cream Apply to affected area once 30 g 3   metFORMIN (GLUCOPHAGE-XR) 500 MG 24 hr tablet Take 500 mg by mouth in the morning, at noon, and at bedtime.     ondansetron (ZOFRAN ODT) 4 MG disintegrating tablet Take 1 tablet (4 mg total) by mouth every 8 (eight) hours as needed for nausea or vomiting. 20 tablet 0   ondansetron (ZOFRAN) 8 MG tablet Take 1 tablet (8 mg total) by mouth  2 (two) times daily as needed. Start on day 3 after chemotherapy. 30 tablet 1   oxyCODONE (ROXICODONE) 5 MG immediate release tablet Take 1 tablet (5 mg total) by mouth every 6 (six) hours as needed for up to 6 doses for severe pain. 30 tablet 0   prochlorperazine (COMPAZINE) 10 MG tablet Take 1 tablet (10 mg total) by mouth every 6 (six) hours as needed (Nausea or vomiting). 30 tablet 1   Red Yeast Rice 600 MG TABS Take 300 mg by mouth daily.     simvastatin (ZOCOR) 80 MG tablet Take 1 tablet (80 mg total) by mouth daily. 90 tablet 1   traMADol (ULTRAM) 50 MG tablet Take 1 tablet (50 mg total) by mouth every 6 (six) hours as needed. 90 tablet 0   traZODone (  DESYREL) 150 MG tablet TAKE 1/2 TO 1 TABLET 1 HOUR BEFORE BEDTIME FOR SLEEP 90 tablet 1   zolpidem (AMBIEN) 10 MG tablet Take 0.5-1 tablets (5-10 mg total) by mouth at bedtime as needed for sleep. 30 tablet 0   zolpidem (AMBIEN) 10 MG tablet Take 1 tablet (10 mg total) by mouth at bedtime as needed for sleep. 30 tablet 0   No current facility-administered medications for this visit.   Facility-Administered Medications Ordered in Other Visits  Medication Dose Route Frequency Provider Last Rate Last Admin   influenza vaccine adjuvanted (FLUAD) injection 0.5 mL  0.5 mL Intramuscular Once [name], MD        PHYSICAL EXAMINATION: ECOG PERFORMANCE STATUS: 1 - Symptomatic but completely ambulatory  Vitals:   10/29/21 1039  BP: (!) 144/83  Pulse: 81  Resp: 18  Temp: 98.5 F (36.9 C)  SpO2: 99%   Wt Readings from Last 3 Encounters:  10/29/21 159 lb (72.1 kg)  10/16/21 156 lb 6.4 oz (70.9 kg)  09/24/21 158 lb 9.6 oz (71.9 kg)     GENERAL:alert, no distress and comfortable SKIN: skin color normal, no rashes or significant lesions EYES: normal, Conjunctiva are pink and non-injected, sclera clear  NEURO: alert & oriented x 3 with fluent speech  LABORATORY DATA:  I have reviewed the data as listed CBC Latest Ref Rng & Units  10/29/2021 10/16/2021 09/24/2021  WBC 4.0 - 10.5 K/uL 12.7(H) 6.5 16.4(H)  Hemoglobin 13.0 - 17.0 g/dL 11.8(L) 12.8(L) 12.4(L)  Hematocrit 39.0 - 52.0 % 33.7(L) 36.0(L) 35.4(L)  Platelets 150 - 400 K/uL 90(L) 116(L) 139(L)     CMP Latest Ref Rng & Units 10/29/2021 10/16/2021 09/24/2021  Glucose 70 - 99 mg/dL 320(H) 261(H) 310(H)  BUN 8 - 23 mg/dL $Remove'9 10 10  'SdKQyVE$ Creatinine 0.61 - 1.24 mg/dL 0.86 0.75 0.86  Sodium 135 - 145 mmol/L 140 137 139  Potassium 3.5 - 5.1 mmol/L 3.6 3.7 3.6  Chloride 98 - 111 mmol/L 107 104 105  CO2 22 - 32 mmol/L $RemoveB'23 27 25  'ZTJEmMlb$ Calcium 8.9 - 10.3 mg/dL 8.6(L) 9.1 8.8(L)  Total Protein 6.5 - 8.1 g/dL 6.3(L) 6.7 6.2(L)  Total Bilirubin 0.3 - 1.2 mg/dL 0.5 0.7 0.6  Alkaline Phos 38 - 126 U/L 159(H) 120 172(H)  AST 15 - 41 U/L 21 35 21  ALT 0 - 44 U/L 23 38 28      RADIOGRAPHIC STUDIES: I have personally reviewed the radiological images as listed and agreed with the findings in the report. No results found.    No orders of the defined types were placed in this encounter.  All questions were answered. The patient knows to call the clinic with any problems, questions or concerns. No barriers to learning was detected. The total time spent in the appointment was 30 minutes.     [name], MD 10/29/2021   I, Wilburn Mylar, am acting as scribe for [name], MD.   I have reviewed the above documentation for accuracy and completeness, and I agree with the above.

## 2021-10-29 NOTE — Progress Notes (Signed)
Nutrition Follow-up:   Patient with pancreatic cancer receiving folfirinox.  Followed by Dr Burr Medico.  Requested to hold treatment today until first of the year.     Met with patient in exam room following MD appointment.  Patient reports that appetite has been better, gained weight.  Reports eating cinnamon rolls for breakfast yesterday, french onion soup for lunch and asian food for dinner.  Snacks on banana bread, had chocolate pie.  Says that he tried the ensure complete and KF shakes and they were "Ok".  Not a big fan of the shakes.  Does not do well with grazing.    Medications: reviewed  Labs: glucose 320  Anthropometrics:   Weight 159 lb today  156 lb 6.4 oz on 11/29 158 lb 9.6 oz on 11/7 158 lb 9 oz on 10/24 164 lb 11.2 oz on 10/10 162 lb on 9/26 160 lb 5 oz on 9/9 198 lb on 12/05/20   NUTRITION DIAGNOSIS: Unintentional weight loss improved   INTERVENTION:  Patient wanting to work on eating better to help blood glucose.  Discussed some strategies to help.     MONITORING, EVALUATION, GOAL: weight trends, intake, blood glucose   NEXT VISIT: to be determined with treatment  Renaye Janicki B. Zenia Resides, [place], [address] Registered Dietitian [phone] (mobile)

## 2021-10-29 NOTE — Patient Instructions (Signed)
Influenza Virus Vaccine injection °What is this medication? °INFLUENZA VIRUS VACCINE (in floo EN zuh VAHY ruhs vak SEEN) helps to reduce the risk of getting influenza also known as the flu. The vaccine only helps protect you against some strains of the flu. °This medicine may be used for other purposes; ask your health care provider or pharmacist if you have questions. °COMMON BRAND NAME(S): Afluria, Afluria Quadrivalent, Agriflu, Alfuria, FLUAD, FLUAD Quadrivalent, Fluarix, Fluarix Quadrivalent, Flublok, Flublok Quadrivalent, FLUCELVAX, FLUCELVAX Quadrivalent, Flulaval, Flulaval Quadrivalent, Fluvirin, Fluzone, Fluzone High-Dose, Fluzone Intradermal, Fluzone Quadrivalent °What should I tell my care team before I take this medication? °They need to know if you have any of these conditions: °bleeding disorder like hemophilia °fever or infection °Guillain-Barre syndrome or other neurological problems °immune system problems °infection with the human immunodeficiency virus (HIV) or AIDS °low blood platelet counts °multiple sclerosis °an unusual or allergic reaction to influenza virus vaccine, latex, other medicines, foods, dyes, or preservatives. Different brands of vaccines contain different allergens. Some may contain latex or eggs. Talk to your doctor about your allergies to make sure that you get the right vaccine. °pregnant or trying to get pregnant °breast-feeding °How should I use this medication? °This vaccine is for injection into a muscle or under the skin. It is given by a health care professional. °A copy of Vaccine Information Statements will be given before each vaccination. Read this sheet carefully each time. The sheet may change frequently. °Talk to your healthcare provider to see which vaccines are right for you. Some vaccines should not be used in all age groups. °Overdosage: If you think you have taken too much of this medicine contact a poison control center or emergency room at once. °NOTE: This  medicine is only for you. Do not share this medicine with others. °What if I miss a dose? °This does not apply. °What may interact with this medication? °chemotherapy or radiation therapy °medicines that lower your immune system like etanercept, anakinra, infliximab, and adalimumab °medicines that treat or prevent blood clots like warfarin °phenytoin °steroid medicines like prednisone or cortisone °theophylline °vaccines °This list may not describe all possible interactions. Give your health care provider a list of all the medicines, herbs, non-prescription drugs, or dietary supplements you use. Also tell them if you smoke, drink alcohol, or use illegal drugs. Some items may interact with your medicine. °What should I watch for while using this medication? °Report any side effects that do not go away within 3 days to your doctor or health care professional. Call your health care provider if any unusual symptoms occur within 6 weeks of receiving this vaccine. °You may still catch the flu, but the illness is not usually as bad. You cannot get the flu from the vaccine. The vaccine will not protect against colds or other illnesses that may cause fever. The vaccine is needed every year. °What side effects may I notice from receiving this medication? °Side effects that you should report to your doctor or health care professional as soon as possible: °allergic reactions like skin rash, itching or hives, swelling of the face, lips, or tongue °Side effects that usually do not require medical attention (report to your doctor or health care professional if they continue or are bothersome): °fever °headache °muscle aches and pains °pain, tenderness, redness, or swelling at the injection site °tiredness °This list may not describe all possible side effects. Call your doctor for medical advice about side effects. You may report side effects to FDA at 1-800-FDA-1088. °  Where should I keep my medication? °The vaccine will be given  by a health care professional in a clinic, pharmacy, doctor's office, or other health care setting. You will not be given vaccine doses to store at home. °NOTE: This sheet is a summary. It may not cover all possible information. If you have questions about this medicine, talk to your doctor, pharmacist, or health care provider. °© 2022 [employer]vier/Gold Standard (2021-07-24 00:00:00) ° °

## 2021-10-30 ENCOUNTER — Telehealth: Payer: Self-pay | Admitting: Hematology

## 2021-10-30 NOTE — Telephone Encounter (Signed)
Rescheduled upcoming appointments per 12/12 los. Patient is aware of changes.

## 2021-10-31 ENCOUNTER — Inpatient Hospital Stay: Payer: Medicare Other

## 2021-10-31 LAB — CANCER ANTIGEN 19-9: CA 19-9: 112 U/mL — ABNORMAL HIGH (ref 0–35)

## 2021-11-07 ENCOUNTER — Other Ambulatory Visit: Payer: Self-pay | Admitting: Internal Medicine

## 2021-11-07 MED ORDER — HYOSCYAMINE SULFATE 0.125 MG SL SUBL: SUBLINGUAL_TABLET | SUBLINGUAL | 0 refills | Status: DC

## 2021-11-14 ENCOUNTER — Inpatient Hospital Stay: Payer: Medicare Other | Admitting: Hematology

## 2021-11-14 ENCOUNTER — Inpatient Hospital Stay: Payer: Medicare Other

## 2021-11-16 ENCOUNTER — Inpatient Hospital Stay: Payer: Medicare Other

## 2021-11-19 ENCOUNTER — Other Ambulatory Visit: Payer: Self-pay | Admitting: Hematology

## 2021-11-21 ENCOUNTER — Encounter: Payer: Self-pay | Admitting: Hematology

## 2021-11-21 ENCOUNTER — Other Ambulatory Visit: Payer: Self-pay | Admitting: Nurse Practitioner

## 2021-11-21 MED ORDER — ALPRAZOLAM 0.25 MG PO TABS: 0.2500 mg | ORAL_TABLET | Freq: Two times a day (BID) | ORAL | 0 refills | Status: DC | PRN

## 2021-11-21 MED ORDER — ZOLPIDEM TARTRATE 10 MG PO TABS: 10.0000 mg | ORAL_TABLET | Freq: Every evening | ORAL | 0 refills | Status: DC | PRN

## 2021-11-21 MED ORDER — TRAMADOL HCL 50 MG PO TABS: 50.0000 mg | ORAL_TABLET | Freq: Four times a day (QID) | ORAL | 0 refills | Status: DC | PRN

## 2021-11-21 MED FILL — Fosaprepitant Dimeglumine For IV Infusion 150 MG (Base Eq): INTRAVENOUS | Qty: 5 | Status: AC

## 2021-11-21 MED FILL — Dexamethasone Sodium Phosphate Inj 100 MG/10ML: INTRAMUSCULAR | Qty: 1 | Status: AC

## 2021-11-22 ENCOUNTER — Inpatient Hospital Stay (HOSPITAL_BASED_OUTPATIENT_CLINIC_OR_DEPARTMENT_OTHER): Payer: Medicare Other | Admitting: Hematology

## 2021-11-22 ENCOUNTER — Other Ambulatory Visit: Payer: Self-pay

## 2021-11-22 ENCOUNTER — Encounter: Payer: Self-pay | Admitting: Hematology

## 2021-11-22 ENCOUNTER — Inpatient Hospital Stay: Payer: Medicare Other

## 2021-11-22 ENCOUNTER — Inpatient Hospital Stay: Payer: Medicare Other | Attending: Hematology

## 2021-11-22 VITALS — BP 110/73 | HR 85 | Temp 98.1°F | Resp 18 | Wt 165.1 lb

## 2021-11-22 DIAGNOSIS — Z800 Family history of malignant neoplasm of digestive organs: Secondary | ICD-10-CM | POA: Insufficient documentation

## 2021-11-22 DIAGNOSIS — D61818 Other pancytopenia: Secondary | ICD-10-CM | POA: Diagnosis not present

## 2021-11-22 DIAGNOSIS — G4700 Insomnia, unspecified: Secondary | ICD-10-CM | POA: Diagnosis not present

## 2021-11-22 DIAGNOSIS — Z95828 Presence of other vascular implants and grafts: Secondary | ICD-10-CM

## 2021-11-22 DIAGNOSIS — C259 Malignant neoplasm of pancreas, unspecified: Secondary | ICD-10-CM

## 2021-11-22 DIAGNOSIS — Z5111 Encounter for antineoplastic chemotherapy: Secondary | ICD-10-CM | POA: Diagnosis present

## 2021-11-22 DIAGNOSIS — C251 Malignant neoplasm of body of pancreas: Secondary | ICD-10-CM | POA: Diagnosis not present

## 2021-11-22 DIAGNOSIS — N200 Calculus of kidney: Secondary | ICD-10-CM | POA: Diagnosis not present

## 2021-11-22 DIAGNOSIS — E559 Vitamin D deficiency, unspecified: Secondary | ICD-10-CM | POA: Diagnosis not present

## 2021-11-22 DIAGNOSIS — E785 Hyperlipidemia, unspecified: Secondary | ICD-10-CM | POA: Diagnosis not present

## 2021-11-22 DIAGNOSIS — R5383 Other fatigue: Secondary | ICD-10-CM | POA: Insufficient documentation

## 2021-11-22 DIAGNOSIS — R918 Other nonspecific abnormal finding of lung field: Secondary | ICD-10-CM | POA: Diagnosis not present

## 2021-11-22 DIAGNOSIS — E1165 Type 2 diabetes mellitus with hyperglycemia: Secondary | ICD-10-CM | POA: Insufficient documentation

## 2021-11-22 DIAGNOSIS — F419 Anxiety disorder, unspecified: Secondary | ICD-10-CM | POA: Diagnosis not present

## 2021-11-22 DIAGNOSIS — K5900 Constipation, unspecified: Secondary | ICD-10-CM | POA: Diagnosis not present

## 2021-11-22 DIAGNOSIS — Z8042 Family history of malignant neoplasm of prostate: Secondary | ICD-10-CM | POA: Diagnosis not present

## 2021-11-22 DIAGNOSIS — Z5189 Encounter for other specified aftercare: Secondary | ICD-10-CM | POA: Insufficient documentation

## 2021-11-22 LAB — CMP (CANCER CENTER ONLY)
ALT: 19 U/L (ref 0–44)
AST: 19 U/L (ref 15–41)
Albumin: 3.8 g/dL (ref 3.5–5.0)
Alkaline Phosphatase: 94 U/L (ref 38–126)
Anion gap: 6 (ref 5–15)
BUN: 15 mg/dL (ref 8–23)
CO2: 27 mmol/L (ref 22–32)
Calcium: 9.2 mg/dL (ref 8.9–10.3)
Chloride: 104 mmol/L (ref 98–111)
Creatinine: 0.81 mg/dL (ref 0.61–1.24)
GFR, Estimated: >60 mL/min (ref >60)
Glucose, Bld: 250 mg/dL — ABNORMAL HIGH (ref 70–99)
Potassium: 4 mmol/L (ref 3.5–5.1)
Sodium: 137 mmol/L (ref 135–145)
Total Bilirubin: 0.5 mg/dL (ref 0.3–1.2)
Total Protein: 6.6 g/dL (ref 6.5–8.1)

## 2021-11-22 LAB — CBC WITH DIFFERENTIAL (CANCER CENTER ONLY)
Abs Immature Granulocytes: 0.01 10*3/uL (ref 0.00–0.07)
Basophils Absolute: 0 10*3/uL (ref 0.0–0.1)
Basophils Relative: 1 %
Eosinophils Absolute: 0.2 10*3/uL (ref 0.0–0.5)
Eosinophils Relative: 4 %
HCT: 36.5 % — ABNORMAL LOW (ref 39.0–52.0)
Hemoglobin: 12.4 g/dL — ABNORMAL LOW (ref 13.0–17.0)
Immature Granulocytes: 0 %
Lymphocytes Relative: 26 %
Lymphs Abs: 1.4 10*3/uL (ref 0.7–4.0)
MCH: 33.7 pg (ref 26.0–34.0)
MCHC: 34 g/dL (ref 30.0–36.0)
MCV: 99.2 fL (ref 80.0–100.0)
Monocytes Absolute: 0.5 10*3/uL (ref 0.1–1.0)
Monocytes Relative: 9 %
Neutro Abs: 3.2 10*3/uL (ref 1.7–7.7)
Neutrophils Relative %: 60 %
Platelet Count: 113 10*3/uL — ABNORMAL LOW (ref 150–400)
RBC: 3.68 MIL/uL — ABNORMAL LOW (ref 4.22–5.81)
RDW: 12.1 % (ref 11.5–15.5)
WBC Count: 5.3 10*3/uL (ref 4.0–10.5)
nRBC: 0 % (ref 0.0–0.2)

## 2021-11-22 MED ORDER — DEXTROSE 5 % IV SOLN: Freq: Once | INTRAVENOUS | Status: AC

## 2021-11-22 MED ORDER — OXALIPLATIN CHEMO INJECTION 100 MG/20ML
70.0000 mg/m2 | Freq: Once | INTRAVENOUS | Status: AC
  Administered 2021-11-22: 135 mg via INTRAVENOUS
  Filled 2021-11-22: qty 20

## 2021-11-22 MED ORDER — SODIUM CHLORIDE 0.9 % IV SOLN
400.0000 mg/m2 | Freq: Once | INTRAVENOUS | Status: AC
  Administered 2021-11-22: 764 mg via INTRAVENOUS
  Filled 2021-11-22: qty 25

## 2021-11-22 MED ORDER — SODIUM CHLORIDE 0.9 % IV SOLN
150.0000 mg/m2 | Freq: Once | INTRAVENOUS | Status: AC
  Administered 2021-11-22: 280 mg via INTRAVENOUS
  Filled 2021-11-22: qty 14

## 2021-11-22 MED ORDER — SODIUM CHLORIDE 0.9% FLUSH
10.0000 mL | Freq: Once | INTRAVENOUS | Status: AC
  Administered 2021-11-22: 10 mL

## 2021-11-22 MED ORDER — SODIUM CHLORIDE 0.9 % IV SOLN
150.0000 mg | Freq: Once | INTRAVENOUS | Status: AC
  Administered 2021-11-22: 150 mg via INTRAVENOUS
  Filled 2021-11-22: qty 5
  Filled 2021-11-22: qty 150

## 2021-11-22 MED ORDER — PALONOSETRON HCL INJECTION 0.25 MG/5ML
0.2500 mg | Freq: Once | INTRAVENOUS | Status: AC
  Administered 2021-11-22: 0.25 mg via INTRAVENOUS
  Filled 2021-11-22: qty 5

## 2021-11-22 MED ORDER — SODIUM CHLORIDE 0.9 % IV SOLN
10.0000 mg | Freq: Once | INTRAVENOUS | Status: AC
  Administered 2021-11-22: 10 mg via INTRAVENOUS
  Filled 2021-11-22: qty 10
  Filled 2021-11-22: qty 1

## 2021-11-22 MED ORDER — ATROPINE SULFATE 1 MG/ML IV SOLN
0.5000 mg | Freq: Once | INTRAVENOUS | Status: AC | PRN
  Administered 2021-11-22: 0.5 mg via INTRAVENOUS
  Filled 2021-11-22: qty 1

## 2021-11-22 MED ORDER — SODIUM CHLORIDE 0.9 % IV SOLN
2200.0000 mg/m2 | INTRAVENOUS | Status: DC
  Administered 2021-11-22: 4200 mg via INTRAVENOUS
  Filled 2021-11-22: qty 84

## 2021-11-22 NOTE — Patient Instructions (Signed)
[facility]  Discharge Instructions: Thank you for choosing [facility] to provide your oncology and hematology care.   If you have a lab appointment with the [facility], please go directly to the [facility] and check in at the registration area.   Wear comfortable clothing and clothing appropriate for easy access to any Portacath or PICC line.   We strive to give you quality time with your provider. You may need to reschedule your appointment if you arrive late (15 or more minutes).  Arriving late affects you and other patients whose appointments are after yours.  Also, if you miss three or more appointments without notifying the office, you may be dismissed from the clinic at the providers discretion.      For prescription refill requests, have your pharmacy contact our office and allow 72 hours for refills to be completed.    Today you received the following chemotherapy and/or immunotherapy agents FOLFIRINOX      To help prevent nausea and vomiting after your treatment, we encourage you to take your nausea medication as directed.  BELOW ARE SYMPTOMS THAT SHOULD BE REPORTED IMMEDIATELY: *FEVER GREATER THAN 100.4 F (38 C) OR HIGHER *CHILLS OR SWEATING *NAUSEA AND VOMITING THAT IS NOT CONTROLLED WITH YOUR NAUSEA MEDICATION *UNUSUAL SHORTNESS OF BREATH *UNUSUAL BRUISING OR BLEEDING *URINARY PROBLEMS (pain or burning when urinating, or frequent urination) *BOWEL PROBLEMS (unusual diarrhea, constipation, pain near the anus) TENDERNESS IN MOUTH AND THROAT WITH OR WITHOUT PRESENCE OF ULCERS (sore throat, sores in mouth, or a toothache) UNUSUAL RASH, SWELLING OR PAIN  UNUSUAL VAGINAL DISCHARGE OR ITCHING   Items with * indicate a potential emergency and should be followed up as soon as possible or go to the Emergency Department if any problems should occur.  Please show the CHEMOTHERAPY ALERT CARD or IMMUNOTHERAPY ALERT CARD at check-in to  the Emergency Department and triage nurse.  Should you have questions after your visit or need to cancel or reschedule your appointment, please contact [facility]  Dept: [phone]  and follow the prompts.  Office hours are 8:00 a.m. to 4:30 p.m. Monday - Friday. Please note that voicemails left after 4:00 p.m. may not be returned until the following business day.  We are closed weekends and major holidays. You have access to a nurse at all times for urgent questions. Please call the main number to the clinic Dept: [phone] and follow the prompts.   For any non-urgent questions, you may also contact your provider using MyChart. We now offer e-Visits for anyone 66 and older to request care online for non-urgent symptoms. For details visit [email or web address].   Also download the MyChart app! Go to the app store, search "MyChart", open the app, select [facility], and log in with your MyChart username and password.  Due to Covid, a mask is required upon entering the hospital/clinic. If you do not have a mask, one will be given to you upon arrival. For doctor visits, patients may have 1 support person aged 66 or older with them. For treatment visits, patients cannot have anyone with them due to current Covid guidelines and our immunocompromised population.

## 2021-11-22 NOTE — Progress Notes (Signed)
[place]   [phone] [phone]   Clinic Follow up Note   Patient Care Team: [name], MD as PCP - General (Internal Medicine) [name], NP as PCP - Hematology/Oncology (Nurse Practitioner) [name], MD (Internal Medicine) [name], MD as Consulting Physician (Orthopedic Surgery) [name], MD as Consulting Physician (Cardiology) [name], MD as Consulting Physician (Oncology) Royston Bake, RN as Oncology Nurse Navigator (Oncology)  Date of Service:  11/22/2021  CHIEF COMPLAINT: f/u of metastatic pancreatic cancer  CURRENT THERAPY:  First line FOLFIRINOX, q2 weeks, starting 08/13/21  ASSESSMENT & PLAN:  Zachary Reid is a 66 y.o. male with   1. Metastatic pancreatic adenocarcinoma, [id number] with numerous small cavitary lesions in bilateral lungs, MMR normal  -found incidentally on recent CT scan for left kidney stone.  He does have some nonspecific symptoms, including epigastric pain, chronic right flank pain, and a dry cough.  He has had significant weight loss in the past several years. -He proceeded to colonoscopy and EUS on 07/26/21.  It demonstrated a solid-cystic mass in the genu-body of the pancreas, with endosonographic evidence of SMA abutment, T4N0. Cytology confirmed adenocarcinoma.  Baseline CA 19-9 elevated to 273 on 07/03/21 -He began first-line mFOLFIRINOX on 08/13/21, tolerated moderate well with severe fatigue for 3-7 days, constipation, and mild pancytopenia. He is able to recover well. GCSF added with cycle 2 -restaging CT CAP 10/25/21 showed overall stable disease. His numerous lung nodules with cavitation are less solid on images which show response to treatment, his primary pancreatic tumor is also slightly smaller.  -he is ready to restart treatment today. He notes he is "tolerating this treatment better than the last." Labs reviewed, adequate to proceed with C6.   2. Anxiety,  Insomnia -anxiety began 2022. He was started on lexapro on 04/19/21 and wellbutrin on 06/05/21 but came off them in August due to constipation -reported new anxiety attacks since diagnosis. Rx Xanax on 07/12/21, helpful.  -he notes he still has insomnia. Ambien prescribed 07/27/21, brand is more effective than generic.  -I again reviewed that he should not take ambien, tramadol, or xanax at the same time. -F/up Dr. Michail Sermon   3. Genetic Testing -he reports pancreatic cancer in his mother, prostate cancer in his father, ovarian in PGM, and lung in PGF (smoker). -he proceeded with testing on 07/30/21. -Result shows a variant of uncertain significance in the CDKN1B gene   4. Left kidney stone, right flank pain -f/u urology for kidney stone   5. DM  -f/u with PCP     PLAN: -proceed with C6 FOLFIRINOX with same slightly reduced oxaliplatin at $RemoveBefore'70mg'[id number]$ /m2   -Udenyca and 1h IVF on day 3 -lab, flush, f/u, and C7 on 12/06/21 as scheduled.   No problem-specific Assessment & Plan notes found for this encounter.   SUMMARY OF ONCOLOGIC HISTORY: Oncology History Overview Note   Cancer Staging  Pancreatic adenocarcinoma [facility]) Staging form: Exocrine Pancreas, AJCC 8th Edition - Clinical stage from 07/26/2021: Stage IV (cT4, cN0, cM1) - Signed by [name], MD on 07/28/2021    Pancreatic adenocarcinoma ([place])  07/02/2021 Initial Diagnosis   Pancreatic adenocarcinoma ([place])   07/26/2021 Cancer Staging   Staging form: Exocrine Pancreas, AJCC 8th Edition - Clinical stage from 07/26/2021: Stage IV (cT4, cN0, cM1) - Signed by [name], MD on 07/28/2021 Stage prefix: Initial diagnosis Total positive nodes: 0    08/13/2021 -  Chemotherapy   Patient is on Treatment Plan :  PANCREAS Modified FOLFIRINOX q14d x 4 cycles      Genetic Testing   [facility] CancerNext-Expanded results (77 genes) were negative. No pathogenic variants were identified. 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  The report date is 08/15/2021.    The CancerNext-Expanded gene panel offered by [facility] and includes sequencing, rearrangement, and RNA analysis for the following 77 genes: AIP, ALK, APC, ATM, AXIN2, BAP1, BARD1, BLM, BMPR1A, BRCA1, BRCA2, BRIP1, CDC73, CDH1, CDK4, CDKN1B, CDKN2A, CHEK2, CTNNA1, DICER1, FANCC, FH, FLCN, GALNT12, KIF1B, LZTR1, MAX, MEN1, MET, MLH1, MSH2, MSH3, MSH6, MUTYH, NBN, NF1, NF2, NTHL1, PALB2, PHOX2B, PMS2, POT1, PRKAR1A, PTCH1, PTEN, RAD51C, RAD51D, RB1, RECQL, RET, SDHA, SDHAF2, SDHB, SDHC, SDHD, SMAD4, SMARCA4, SMARCB1, SMARCE1, STK11, SUFU, TMEM127, TP53, TSC1, TSC2, VHL and XRCC2 (sequencing and deletion/duplication); EGFR, EGLN1, HOXB13, KIT, MITF, PDGFRA, POLD1, and POLE (sequencing only); EPCAM and GREM1 (deletion/duplication only).     10/25/2021 Imaging   EXAM: CT CHEST, ABDOMEN, AND PELVIS WITH CONTRAST  IMPRESSION: 1. Innumerable bilateral pulmonary nodules, many of which are cavitary, consistent with metastatic disease. These nodules show no substantial change in are minimally progressed in the interval. 2. Mix cystic and solid lesion in the head and body of the pancreas is similar to prior and also comparing back to MRI 07/02/2021. 3. Hepatic cysts. 4. 8 mm nonobstructing left renal stone. 5. Aortic Atherosclerosis (ICD10-I70.0).      INTERVAL HISTORY:  Zachary Reid is here for a follow up of metastatic pancreatic cancer. He was last seen by me on 10/29/21. He presents to the clinic alone. He reports he has recovered during his break and is ready to start treatment again. He notes he is sleeping well with the ambien.   All other systems were reviewed with the patient and are negative.  MEDICAL HISTORY:  Past Medical History:  Diagnosis Date   Abnormal EKG    DM (diabetes mellitus) ([place])    Hyperlipidemia    Hypogonadism male    IBS (irritable bowel syndrome)    Obesity    BMI 31   pancreatic ca 06/28/2021   Vitamin D deficiency      SURGICAL HISTORY: Past Surgical History:  Procedure Laterality Date   APPENDECTOMY     BIOPSY  07/26/2021   Procedure: BIOPSY;  Surgeon: Irving Copas., MD;  Location: [place];  Service: Gastroenterology;;   COLONOSCOPY WITH PROPOFOL N/A 07/26/2021   Procedure: COLONOSCOPY WITH PROPOFOL;  Surgeon: Irving Copas., MD;  Location: [place];  Service: Gastroenterology;  Laterality: N/A;   ESOPHAGOGASTRODUODENOSCOPY (EGD) WITH PROPOFOL N/A 07/26/2021   Procedure: ESOPHAGOGASTRODUODENOSCOPY (EGD) WITH PROPOFOL;  Surgeon: Rush Landmark Telford Nab., MD;  Location: [place];  Service: Gastroenterology;  Laterality: N/A;   EUS N/A 07/26/2021   Procedure: UPPER ENDOSCOPIC ULTRASOUND (EUS) RADIAL;  Surgeon: Irving Copas., MD;  Location: [place];  Service: Gastroenterology;  Laterality: N/A;   FINE NEEDLE ASPIRATION  07/26/2021   Procedure: FINE NEEDLE ASPIRATION (FNA) LINEAR;  Surgeon: Rush Landmark Telford Nab., MD;  Location: [place];  Service: Gastroenterology;;   IR IMAGING GUIDED PORT INSERTION  08/10/2021   POLYPECTOMY  07/26/2021   Procedure: POLYPECTOMY;  Surgeon: Rush Landmark Telford Nab., MD;  Location: [place];  Service: Gastroenterology;;   SHOULDER SURGERY Right     I have reviewed the social history and family history with the patient and they are unchanged from previous note.  ALLERGIES:  is allergic to lipitor [atorvastatin].  MEDICATIONS:  Current Outpatient Medications  Medication  Sig Dispense Refill   ALPRAZolam (XANAX) 0.25 MG tablet Take 1-2 tablets (0.25-0.5 mg total) by mouth 2 (two) times daily as needed for anxiety. 60 tablet 0   aspirin EC 81 MG tablet Take 81 mg by mouth in the morning and at bedtime. Swallow whole.     buPROPion (WELLBUTRIN XL) 300 MG 24 hr tablet Take 1 tablet every Morning for Mood, Focus & Concentration 90 tablet 3   Cholecalciferol (VITAMIN D3) 125 MCG (5000 UT) CAPS Take 5,000 Units by mouth in the morning  and at bedtime.     dexamethasone (DECADRON) 4 MG tablet Take 1 tablet by mouth in the morning for 3-5 days after chemo, starting after pump disconnect, for nausea, low appetite, and fatigue 20 tablet 1   dronabinol (MARINOL) 5 MG capsule Take 1 capsule (5 mg total) by mouth 2 (two) times daily before a meal. 60 capsule 0   escitalopram (LEXAPRO) 20 MG tablet Take 1 tablet  Daily  for Mood & Chronic Anxiety 90 tablet 3   gabapentin (NEURONTIN) 600 MG tablet Take 1 tablet (600 mg total) by mouth 3 (three) times daily. 90 tablet 1   glipiZIDE (GLUCOTROL) 5 MG tablet TAKE 1 TABLET BY MOUTH 2 TO 3 TIMES /DAY WITH MEALS FOR DIABETES (Patient taking differently: Take 10 mg by mouth in the morning, at noon, and at bedtime.) 270 tablet 1   glucose blood test strip Use as instructed 100 each 12   hydrocortisone (ANUSOL-HC) 25 MG suppository Place 1 suppository (25 mg total) rectally at bedtime. QHS x 1-week and then Every Other night until prescription complete 12 suppository 0   hyoscyamine (LEVSIN SL) 0.125 MG SL tablet DISSOLVE ONE TABLET UNDER THE TONGUE 4 TIMES DAILY UP TO EVERY 4 HOURS AS NEEDED FOR NAUSEA, BLOATING, CRAMPING, OR DIARRHEA 100 tablet 0   lidocaine-prilocaine (EMLA) cream Apply to affected area once 30 g 3   metFORMIN (GLUCOPHAGE-XR) 500 MG 24 hr tablet Take 500 mg by mouth in the morning, at noon, and at bedtime.     ondansetron (ZOFRAN ODT) 4 MG disintegrating tablet Take 1 tablet (4 mg total) by mouth every 8 (eight) hours as needed for nausea or vomiting. 20 tablet 0   ondansetron (ZOFRAN) 8 MG tablet Take 1 tablet (8 mg total) by mouth 2 (two) times daily as needed. Start on day 3 after chemotherapy. 30 tablet 1   oxyCODONE (ROXICODONE) 5 MG immediate release tablet Take 1 tablet (5 mg total) by mouth every 6 (six) hours as needed for up to 6 doses for severe pain. 30 tablet 0   prochlorperazine (COMPAZINE) 10 MG tablet Take 1 tablet (10 mg total) by mouth every 6 (six) hours as needed  (Nausea or vomiting). 30 tablet 1   Red Yeast Rice 600 MG TABS Take 300 mg by mouth daily.     simvastatin (ZOCOR) 80 MG tablet Take 1 tablet (80 mg total) by mouth daily. 90 tablet 1   traMADol (ULTRAM) 50 MG tablet Take 1 tablet (50 mg total) by mouth every 6 (six) hours as needed. 90 tablet 0   traZODone (DESYREL) 150 MG tablet TAKE 1/2 TO 1 TABLET 1 HOUR BEFORE BEDTIME FOR SLEEP 90 tablet 1   zolpidem (AMBIEN) 10 MG tablet Take 0.5-1 tablets (5-10 mg total) by mouth at bedtime as needed for sleep. 30 tablet 0   zolpidem (AMBIEN) 10 MG tablet Take 1 tablet (10 mg total) by mouth at bedtime as needed for sleep. [place]  tablet 0   No current facility-administered medications for this visit.   Facility-Administered Medications Ordered in Other Visits  Medication Dose Route Frequency Provider Last Rate Last Admin   atropine injection 0.5 mg  0.5 mg Intravenous Once PRN [name], MD       fluorouracil (ADRUCIL) 4,200 mg in sodium chloride 0.9 % 66 mL chemo infusion  2,200 mg/m2 (Treatment Plan Recorded) Intravenous 1 day or 1 dose [name], MD       irinotecan (CAMPTOSAR) 280 mg in sodium chloride 0.9 % 500 mL chemo infusion  150 mg/m2 (Treatment Plan Recorded) Intravenous Once [name], MD       leucovorin 764 mg in sodium chloride 0.9 % 250 mL infusion  400 mg/m2 (Treatment Plan Recorded) Intravenous Once [name], MD       oxaliplatin (ELOXATIN) 135 mg in dextrose 5 % 500 mL chemo infusion  70 mg/m2 (Treatment Plan Recorded) Intravenous Once [name], MD       palonosetron (ALOXI) injection 0.25 mg  0.25 mg Intravenous Once [name], MD        PHYSICAL EXAMINATION: ECOG PERFORMANCE STATUS: 1 - Symptomatic but completely ambulatory  Vitals:   11/22/21 0819  BP: 110/73  Pulse: 85  Resp: 18  Temp: 98.1 F (36.7 C)  SpO2: 99%   Wt Readings from Last 3 Encounters:  11/22/21 165 lb 1 oz (74.9 kg)  10/29/21 159 lb (72.1 kg)  10/16/21 156 lb 6.4 oz (70.9 kg)     GENERAL:alert, no  distress and comfortable SKIN: skin color normal, no rashes or significant lesions EYES: normal, Conjunctiva are pink and non-injected, sclera clear  NEURO: alert & oriented x 3 with fluent speech  LABORATORY DATA:  I have reviewed the data as listed CBC Latest Ref Rng & Units 11/22/2021 10/29/2021 10/16/2021  WBC 4.0 - 10.5 K/uL 5.3 12.7(H) 6.5  Hemoglobin 13.0 - 17.0 g/dL 12.4(L) 11.8(L) 12.8(L)  Hematocrit 39.0 - 52.0 % 36.5(L) 33.7(L) 36.0(L)  Platelets 150 - 400 K/uL 113(L) 90(L) 116(L)     CMP Latest Ref Rng & Units 11/22/2021 10/29/2021 10/16/2021  Glucose 70 - 99 mg/dL 250(H) 320(H) 261(H)  BUN 8 - 23 mg/dL $Remove'15 9 10  'wvMBfYc$ Creatinine 0.61 - 1.24 mg/dL 0.81 0.86 0.75  Sodium 135 - 145 mmol/L 137 140 137  Potassium 3.5 - 5.1 mmol/L 4.0 3.6 3.7  Chloride 98 - 111 mmol/L 104 107 104  CO2 22 - 32 mmol/L $RemoveB'27 23 27  'mYbQQwQl$ Calcium 8.9 - 10.3 mg/dL 9.2 8.6(L) 9.1  Total Protein 6.5 - 8.1 g/dL 6.6 6.3(L) 6.7  Total Bilirubin 0.3 - 1.2 mg/dL 0.5 0.5 0.7  Alkaline Phos 38 - 126 U/L 94 159(H) 120  AST 15 - 41 U/L 19 21 35  ALT 0 - 44 U/L 19 23 38      RADIOGRAPHIC STUDIES: I have personally reviewed the radiological images as listed and agreed with the findings in the report. No results found.    No orders of the defined types were placed in this encounter.  All questions were answered. The patient knows to call the clinic with any problems, questions or concerns. No barriers to learning was detected. The total time spent in the appointment was 30 minutes.     [name], MD 11/22/2021   I, Wilburn Mylar, am acting as scribe for [name], MD.   I have reviewed the above documentation for accuracy and completeness, and I agree with the above.

## 2021-11-23 ENCOUNTER — Telehealth: Payer: Self-pay

## 2021-11-23 LAB — CANCER ANTIGEN 19-9: CA 19-9: 109 U/mL — ABNORMAL HIGH (ref 0–35)

## 2021-11-23 NOTE — Telephone Encounter (Signed)
Patient notified of prior authorization approval for Tramadol 50 mg tablets. Medication is approved through 11/23/2022.

## 2021-11-24 ENCOUNTER — Inpatient Hospital Stay: Payer: Medicare Other

## 2021-11-24 ENCOUNTER — Other Ambulatory Visit: Payer: Self-pay

## 2021-11-24 VITALS — BP 131/82 | HR 70 | Temp 98.1°F | Resp 16

## 2021-11-24 DIAGNOSIS — C259 Malignant neoplasm of pancreas, unspecified: Secondary | ICD-10-CM

## 2021-11-24 DIAGNOSIS — Z5111 Encounter for antineoplastic chemotherapy: Secondary | ICD-10-CM | POA: Diagnosis not present

## 2021-11-24 MED ORDER — SODIUM CHLORIDE 0.9% FLUSH
10.0000 mL | INTRAVENOUS | Status: DC | PRN
  Administered 2021-11-24: 10 mL

## 2021-11-24 MED ORDER — HEPARIN SOD (PORK) LOCK FLUSH 100 UNIT/ML IV SOLN
500.0000 [IU] | Freq: Once | INTRAVENOUS | Status: AC | PRN
  Administered 2021-11-24: 500 [IU]

## 2021-11-24 MED ORDER — SODIUM CHLORIDE 0.9 % IV SOLN: INTRAVENOUS | Status: AC

## 2021-11-24 MED ORDER — PEGFILGRASTIM-CBQV 6 MG/0.6ML ~~LOC~~ SOSY
6.0000 mg | PREFILLED_SYRINGE | Freq: Once | SUBCUTANEOUS | Status: AC
  Administered 2021-11-24: 6 mg via SUBCUTANEOUS
  Filled 2021-11-24: qty 0.6

## 2021-11-26 ENCOUNTER — Telehealth: Payer: Self-pay | Admitting: Hematology

## 2021-11-26 NOTE — Telephone Encounter (Signed)
Scheduled follow-up appointments per 1/5 los. Patient is aware.

## 2021-11-30 ENCOUNTER — Telehealth: Payer: Self-pay

## 2021-11-30 NOTE — Telephone Encounter (Signed)
Pt was called due to concerns with having chest congestion for over a month and is worried he might have bronchitis. Pt states this has been mentioned to Dr. Burr Medico as well. Other symptoms mentioned are a cough on/off, phlegm is clear 90% of the time and has been green at times, runny nose/sneezing. Pt has had no fevers. He mentioned he has taken two weeks worth of mucinex and uses nasal spray at times. He has also been taking cough syrups as well. Pt states this does help him sometimes. This LPN recommended pt to first get tested for covid and call back with results then we can go from there. Pt verbalizes understanding.

## 2021-12-05 MED FILL — Dexamethasone Sodium Phosphate Inj 100 MG/10ML: INTRAMUSCULAR | Qty: 1 | Status: AC

## 2021-12-05 MED FILL — Fosaprepitant Dimeglumine For IV Infusion 150 MG (Base Eq): INTRAVENOUS | Qty: 5 | Status: AC

## 2021-12-06 ENCOUNTER — Telehealth (HOSPITAL_BASED_OUTPATIENT_CLINIC_OR_DEPARTMENT_OTHER): Payer: Medicare Other | Admitting: Hematology

## 2021-12-06 ENCOUNTER — Inpatient Hospital Stay: Payer: Medicare Other | Admitting: Nutrition

## 2021-12-06 ENCOUNTER — Encounter: Payer: 59 | Admitting: Nutrition

## 2021-12-06 ENCOUNTER — Inpatient Hospital Stay: Payer: Medicare Other

## 2021-12-06 ENCOUNTER — Other Ambulatory Visit: Payer: Self-pay

## 2021-12-06 ENCOUNTER — Encounter: Payer: Self-pay | Admitting: Hematology

## 2021-12-06 ENCOUNTER — Inpatient Hospital Stay (HOSPITAL_BASED_OUTPATIENT_CLINIC_OR_DEPARTMENT_OTHER): Payer: Medicare Other | Admitting: Hematology

## 2021-12-06 VITALS — BP 113/71 | HR 79 | Temp 98.1°F | Resp 18 | Ht 71.0 in | Wt 160.6 lb

## 2021-12-06 DIAGNOSIS — Z95828 Presence of other vascular implants and grafts: Secondary | ICD-10-CM

## 2021-12-06 DIAGNOSIS — R918 Other nonspecific abnormal finding of lung field: Secondary | ICD-10-CM

## 2021-12-06 DIAGNOSIS — C259 Malignant neoplasm of pancreas, unspecified: Secondary | ICD-10-CM

## 2021-12-06 DIAGNOSIS — E119 Type 2 diabetes mellitus without complications: Secondary | ICD-10-CM

## 2021-12-06 DIAGNOSIS — Z5111 Encounter for antineoplastic chemotherapy: Secondary | ICD-10-CM | POA: Diagnosis not present

## 2021-12-06 LAB — CMP (CANCER CENTER ONLY)
ALT: 21 U/L (ref 0–44)
AST: 17 U/L (ref 15–41)
Albumin: 4 g/dL (ref 3.5–5.0)
Alkaline Phosphatase: 141 U/L — ABNORMAL HIGH (ref 38–126)
Anion gap: 8 (ref 5–15)
BUN: 14 mg/dL (ref 8–23)
CO2: 26 mmol/L (ref 22–32)
Calcium: 9.5 mg/dL (ref 8.9–10.3)
Chloride: 103 mmol/L (ref 98–111)
Creatinine: 0.84 mg/dL (ref 0.61–1.24)
GFR, Estimated: >60 mL/min (ref >60)
Glucose, Bld: 313 mg/dL — ABNORMAL HIGH (ref 70–99)
Potassium: 3.9 mmol/L (ref 3.5–5.1)
Sodium: 137 mmol/L (ref 135–145)
Total Bilirubin: 0.5 mg/dL (ref 0.3–1.2)
Total Protein: 6.9 g/dL (ref 6.5–8.1)

## 2021-12-06 LAB — CBC WITH DIFFERENTIAL (CANCER CENTER ONLY)
Abs Immature Granulocytes: 0.05 10*3/uL (ref 0.00–0.07)
Basophils Absolute: 0.1 10*3/uL (ref 0.0–0.1)
Basophils Relative: 1 %
Eosinophils Absolute: 0.3 10*3/uL (ref 0.0–0.5)
Eosinophils Relative: 3 %
HCT: 37.5 % — ABNORMAL LOW (ref 39.0–52.0)
Hemoglobin: 13.3 g/dL (ref 13.0–17.0)
Immature Granulocytes: 1 %
Lymphocytes Relative: 14 %
Lymphs Abs: 1.4 10*3/uL (ref 0.7–4.0)
MCH: 34.4 pg — ABNORMAL HIGH (ref 26.0–34.0)
MCHC: 35.5 g/dL (ref 30.0–36.0)
MCV: 96.9 fL (ref 80.0–100.0)
Monocytes Absolute: 0.7 10*3/uL (ref 0.1–1.0)
Monocytes Relative: 7 %
Neutro Abs: 7 10*3/uL (ref 1.7–7.7)
Neutrophils Relative %: 74 %
Platelet Count: 88 10*3/uL — ABNORMAL LOW (ref 150–400)
RBC: 3.87 MIL/uL — ABNORMAL LOW (ref 4.22–5.81)
RDW: 12 % (ref 11.5–15.5)
WBC Count: 9.4 10*3/uL (ref 4.0–10.5)
nRBC: 0 % (ref 0.0–0.2)

## 2021-12-06 MED ORDER — SODIUM CHLORIDE 0.9 % IV SOLN: 6.0000 mg | Freq: Once | INTRAVENOUS | Status: DC

## 2021-12-06 MED ORDER — SODIUM CHLORIDE 0.9 % IV SOLN
2000.0000 mg/m2 | INTRAVENOUS | Status: DC
  Administered 2021-12-06: 3800 mg via INTRAVENOUS
  Filled 2021-12-06: qty 76

## 2021-12-06 MED ORDER — SODIUM CHLORIDE 0.9 % IV SOLN
150.0000 mg/m2 | Freq: Once | INTRAVENOUS | Status: AC
  Administered 2021-12-06: 280 mg via INTRAVENOUS
  Filled 2021-12-06: qty 14

## 2021-12-06 MED ORDER — SODIUM CHLORIDE 0.9 % IV SOLN
150.0000 mg | Freq: Once | INTRAVENOUS | Status: AC
  Administered 2021-12-06: 150 mg via INTRAVENOUS
  Filled 2021-12-06: qty 150

## 2021-12-06 MED ORDER — HEPARIN SOD (PORK) LOCK FLUSH 100 UNIT/ML IV SOLN: 500.0000 [IU] | Freq: Once | INTRAVENOUS | Status: DC | PRN

## 2021-12-06 MED ORDER — OXALIPLATIN CHEMO INJECTION 100 MG/20ML
70.0000 mg/m2 | Freq: Once | INTRAVENOUS | Status: AC
  Administered 2021-12-06: 135 mg via INTRAVENOUS
  Filled 2021-12-06: qty 27

## 2021-12-06 MED ORDER — SODIUM CHLORIDE 0.9% FLUSH
10.0000 mL | Freq: Once | INTRAVENOUS | Status: AC
  Administered 2021-12-06: 10 mL

## 2021-12-06 MED ORDER — ATROPINE SULFATE 1 MG/ML IV SOLN
0.5000 mg | Freq: Once | INTRAVENOUS | Status: AC | PRN
  Administered 2021-12-06: 0.5 mg via INTRAVENOUS
  Filled 2021-12-06: qty 1

## 2021-12-06 MED ORDER — PALONOSETRON HCL INJECTION 0.25 MG/5ML
0.2500 mg | Freq: Once | INTRAVENOUS | Status: AC
  Administered 2021-12-06: 0.25 mg via INTRAVENOUS
  Filled 2021-12-06: qty 5

## 2021-12-06 MED ORDER — DEXTROSE 5 % IV SOLN: Freq: Once | INTRAVENOUS | Status: AC

## 2021-12-06 MED ORDER — SODIUM CHLORIDE 0.9% FLUSH: 10.0000 mL | INTRAVENOUS | Status: DC | PRN

## 2021-12-06 MED ORDER — DEXAMETHASONE SODIUM PHOSPHATE 10 MG/ML IJ SOLN
6.0000 mg | Freq: Once | INTRAMUSCULAR | Status: AC
  Administered 2021-12-06: 6 mg via INTRAVENOUS
  Filled 2021-12-06: qty 1

## 2021-12-06 MED ORDER — SODIUM CHLORIDE 0.9 % IV SOLN
400.0000 mg/m2 | Freq: Once | INTRAVENOUS | Status: AC
  Administered 2021-12-06: 764 mg via INTRAVENOUS
  Filled 2021-12-06: qty 25

## 2021-12-06 NOTE — Progress Notes (Signed)
Nutrition follow-up was completed with patient in infusion.  Patient is being treated for pancreatic cancer.  Weight stable and documented as 160.6 pounds on January 19.  Noted glucose 313.  Patient has been referred to endocrinology.  Patient reports that he eats what he wants.  He has been focused on weight maintenance.  States he is taking several alternative supplements ([place] tail mushrooms ) and that his oncologist is aware.    Patient denies nausea, vomiting, constipation, or diarrhea.  Nutrition diagnosis: Unintentional weight loss stable.  Intervention: Provided additional education regarding avoiding concentrated sweets, especially around times of steroid dosage. Agree with endocrinology consult. Encourage patient to increase water intake.  Monitoring, evaluation, goals: Patient will tolerate adequate calories and protein for weight maintenance.  No follow-up scheduled at this time.  Please consult RD for further nutrition needs.  **Disclaimer: This note was dictated with voice recognition software. Similar sounding words can inadvertently be transcribed and this note may contain transcription errors which may not have been corrected upon publication of note.**

## 2021-12-06 NOTE — Progress Notes (Signed)
Patient aware to come in at 2pm for pump DC appt on Saturday

## 2021-12-06 NOTE — Progress Notes (Signed)
Zachary Reid   [phone] [phone]   Clinic Follow up Note   Patient Care Team: [name], MD as PCP - General (Internal Medicine) [name], NP as PCP - Hematology/Oncology (Nurse Practitioner) [name], MD as Consulting Physician (Orthopedic Surgery) [name], MD as Consulting Physician (Cardiology) [name], MD as Consulting Physician (Oncology) Royston Bake, RN as Oncology Nurse Navigator (Oncology)  Date of Service:  12/06/2021  CHIEF COMPLAINT: f/u of metastatic pancreatic cancer  CURRENT THERAPY:  First line FOLFIRINOX, q2 weeks, starting 08/13/21  ASSESSMENT & PLAN:  Zachary Reid is a 66 y.o. male with   1. Metastatic pancreatic adenocarcinoma, [id number] with numerous small cavitary lesions in bilateral lungs, MMR normal  -found incidentally on recent CT scan for left kidney stone.  He does have some nonspecific symptoms, including epigastric pain, chronic right flank pain, and a dry cough.  He has had significant weight loss in the past several years. -He proceeded to colonoscopy and EUS on 07/26/21.  It demonstrated a solid-cystic mass in the genu-body of the pancreas, with endosonographic evidence of SMA abutment, T4N0. Cytology confirmed adenocarcinoma. Baseline CA 19-9 elevated to 273 on 07/03/21 -He began first-line mFOLFIRINOX on 08/13/21, tolerated moderate well with severe fatigue for 3-7 days, constipation, and mild pancytopenia. He is able to recover well. GCSF added with cycle 2 -restaging CT CAP 10/25/21 showed overall stable disease. His numerous lung nodules with cavitation are less solid on images which show response to treatment, his primary pancreatic tumor is also slightly smaller.  -he expressed some difficulty with the steroids for the several days after infusion. He denies difficulty with nausea, so I will lower the dose today. -labs reviewed, his plt count is low today, so I will lower his chemo  dose today.   2. Anxiety, Insomnia -anxiety began 2022. He was started on lexapro on 04/19/21 and wellbutrin on 06/05/21 but came off them in August due to constipation -reported new anxiety attacks since diagnosis. Rx Xanax on 07/12/21, helpful.  -he notes he still has insomnia. Ambien prescribed 07/27/21, brand is more effective than generic.  -I again reviewed that he should not take ambien, tramadol, or xanax at the same time. -F/up Dr. Michail Sermon   3. Genetic Testing -he reports pancreatic cancer in his mother, prostate cancer in his father, ovarian in PGM, and lung in PGF (smoker). -he proceeded with testing on 07/30/21. 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 Left kidney stone, right flank pain -f/u urology for kidney stone   5. DM, uncontrolled -he would like to see someone for better management. I will refer him to endocrinology.     PLAN: -proceed with C7 FOLFIRINOX with reduced dose of 5FU             -Udenyca and 1h IVF on day 3 -referral to endocrinology for his uncontrolled DM  -lab, flush, f/u, and FOLFIRINOX in 2 and 4 weeks   No problem-specific Assessment & Plan notes found for this encounter.   SUMMARY OF ONCOLOGIC HISTORY: Oncology History Overview Note   Cancer Staging  Pancreatic adenocarcinoma [facility]) Staging form: Exocrine Pancreas, AJCC 8th Edition - Clinical stage from 07/26/2021: Stage IV (cT4, cN0, cM1) - Signed by [name], MD on 07/28/2021    Pancreatic adenocarcinoma ([place])  07/02/2021 Initial Diagnosis   Pancreatic adenocarcinoma ([place])   07/26/2021 Cancer Staging   Staging form: Exocrine Pancreas, AJCC 8th Edition - Clinical stage from 07/26/2021:  Stage IV (cT4, cN0, cM1) - Signed by [name], MD on 07/28/2021 Stage prefix: Initial diagnosis Total positive nodes: 0    08/13/2021 -  Chemotherapy   Patient is on Treatment Plan : PANCREAS Modified FOLFIRINOX q14d x 4 cycles      Genetic Testing   [facility] CancerNext-Expanded results (77 genes) were  negative. No pathogenic variants were identified. 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 The report date is 08/15/2021.    The CancerNext-Expanded gene panel offered by [facility] and includes sequencing, rearrangement, and RNA analysis for the following 77 genes: AIP, ALK, APC, ATM, AXIN2, BAP1, BARD1, BLM, BMPR1A, BRCA1, BRCA2, BRIP1, CDC73, CDH1, CDK4, CDKN1B, CDKN2A, CHEK2, CTNNA1, DICER1, FANCC, FH, FLCN, GALNT12, KIF1B, LZTR1, MAX, MEN1, MET, MLH1, MSH2, MSH3, MSH6, MUTYH, NBN, NF1, NF2, NTHL1, PALB2, PHOX2B, PMS2, POT1, PRKAR1A, PTCH1, PTEN, RAD51C, RAD51D, RB1, RECQL, RET, SDHA, SDHAF2, SDHB, SDHC, SDHD, SMAD4, SMARCA4, SMARCB1, SMARCE1, STK11, SUFU, TMEM127, TP53, TSC1, TSC2, VHL and XRCC2 (sequencing and deletion/duplication); EGFR, EGLN1, HOXB13, KIT, MITF, PDGFRA, POLD1, and POLE (sequencing only); EPCAM and GREM1 (deletion/duplication only).     10/25/2021 Imaging   EXAM: CT CHEST, ABDOMEN, AND PELVIS WITH CONTRAST  IMPRESSION: 1. Innumerable bilateral pulmonary nodules, many of which are cavitary, consistent with metastatic disease. These nodules show no substantial change in are minimally progressed in the interval. 2. Mix cystic and solid lesion in the head and body of the pancreas is similar to prior and also comparing back to MRI 07/02/2021. 3. Hepatic cysts. 4. 8 mm nonobstructing left renal stone. 5. Aortic Atherosclerosis (ICD10-I70.0).      INTERVAL HISTORY:  Zachary Reid is here for a follow up of metastatic pancreatic cancer. He was last seen by me on 11/22/21. He presents to the clinic accompanied by his wife. He reports his cough is finally improving. He did note concern with not being able get in contact with someone here the same day. He reports it took 3 days of calling for someone to get in touch with him. He does not feel he is handling his treatment as well mentally as he is physically.   All other systems were reviewed  with the patient and are negative.  MEDICAL HISTORY:  Past Medical History:  Diagnosis Date   Abnormal EKG    DM (diabetes mellitus) ([place])    Hyperlipidemia    Hypogonadism male    IBS (irritable bowel syndrome)    Obesity    BMI 31   pancreatic ca 06/28/2021   Vitamin D deficiency     SURGICAL HISTORY: Past Surgical History:  Procedure Laterality Date   APPENDECTOMY     BIOPSY  07/26/2021   Procedure: BIOPSY;  Surgeon: Irving Copas., MD;  Location: [place];  Service: Gastroenterology;;   COLONOSCOPY WITH PROPOFOL N/A 07/26/2021   Procedure: COLONOSCOPY WITH PROPOFOL;  Surgeon: Irving Copas., MD;  Location: [place];  Service: Gastroenterology;  Laterality: N/A;   ESOPHAGOGASTRODUODENOSCOPY (EGD) WITH PROPOFOL N/A 07/26/2021   Procedure: ESOPHAGOGASTRODUODENOSCOPY (EGD) WITH PROPOFOL;  Surgeon: Rush Landmark Telford Nab., MD;  Location: [place];  Service: Gastroenterology;  Laterality: N/A;   EUS N/A 07/26/2021   Procedure: UPPER ENDOSCOPIC ULTRASOUND (EUS) RADIAL;  Surgeon: Irving Copas., MD;  Location: [place];  Service: Gastroenterology;  Laterality: N/A;   FINE NEEDLE ASPIRATION  07/26/2021   Procedure: FINE NEEDLE ASPIRATION (FNA) LINEAR;  Surgeon: Irving Copas., MD;  Location: [place];  Service: Gastroenterology;;   IR IMAGING  GUIDED PORT INSERTION  08/10/2021   POLYPECTOMY  07/26/2021   Procedure: POLYPECTOMY;  Surgeon: Mansouraty, Telford Nab., MD;  Location: [place];  Service: Gastroenterology;;   SHOULDER SURGERY Right     I have reviewed the social history and family history with the patient and they are unchanged from previous note.  ALLERGIES:  is allergic to lipitor [atorvastatin].  MEDICATIONS:  Current Outpatient Medications  Medication Sig Dispense Refill   ALPRAZolam (XANAX) 0.25 MG tablet Take 1-2 tablets (0.25-0.5 mg total) by mouth 2 (two) times daily as needed for anxiety. 60 tablet 0   aspirin EC 81  MG tablet Take 81 mg by mouth in the morning and at bedtime. Swallow whole.     buPROPion (WELLBUTRIN XL) 300 MG 24 hr tablet Take 1 tablet every Morning for Mood, Focus & Concentration 90 tablet 3   Cholecalciferol (VITAMIN D3) 125 MCG (5000 UT) CAPS Take 5,000 Units by mouth in the morning and at bedtime.     dexamethasone (DECADRON) 4 MG tablet Take 1 tablet by mouth in the morning for 3-5 days after chemo, starting after pump disconnect, for nausea, low appetite, and fatigue 20 tablet 1   dronabinol (MARINOL) 5 MG capsule Take 1 capsule (5 mg total) by mouth 2 (two) times daily before a meal. 60 capsule 0   escitalopram (LEXAPRO) 20 MG tablet Take 1 tablet  Daily  for Mood & Chronic Anxiety 90 tablet 3   gabapentin (NEURONTIN) 600 MG tablet Take 1 tablet (600 mg total) by mouth 3 (three) times daily. 90 tablet 1   glipiZIDE (GLUCOTROL) 5 MG tablet TAKE 1 TABLET BY MOUTH 2 TO 3 TIMES /DAY WITH MEALS FOR DIABETES (Patient taking differently: Take 10 mg by mouth in the morning, at noon, and at bedtime.) 270 tablet 1   glucose blood test strip Use as instructed 100 each 12   hydrocortisone (ANUSOL-HC) 25 MG suppository Place 1 suppository (25 mg total) rectally at bedtime. QHS x 1-week and then Every Other night until prescription complete 12 suppository 0   hyoscyamine (LEVSIN SL) 0.125 MG SL tablet DISSOLVE ONE TABLET UNDER THE TONGUE 4 TIMES DAILY UP TO EVERY 4 HOURS AS NEEDED FOR NAUSEA, BLOATING, CRAMPING, OR DIARRHEA 100 tablet 0   lidocaine-prilocaine (EMLA) cream Apply to affected area once 30 g 3   metFORMIN (GLUCOPHAGE-XR) 500 MG 24 hr tablet Take 500 mg by mouth in the morning, at noon, and at bedtime.     ondansetron (ZOFRAN ODT) 4 MG disintegrating tablet Take 1 tablet (4 mg total) by mouth every 8 (eight) hours as needed for nausea or vomiting. 20 tablet 0   ondansetron (ZOFRAN) 8 MG tablet Take 1 tablet (8 mg total) by mouth 2 (two) times daily as needed. Start on day 3 after  chemotherapy. 30 tablet 1   oxyCODONE (ROXICODONE) 5 MG immediate release tablet Take 1 tablet (5 mg total) by mouth every 6 (six) hours as needed for up to 6 doses for severe pain. 30 tablet 0   prochlorperazine (COMPAZINE) 10 MG tablet Take 1 tablet (10 mg total) by mouth every 6 (six) hours as needed (Nausea or vomiting). 30 tablet 1   Red Yeast Rice 600 MG TABS Take 300 mg by mouth daily.     simvastatin (ZOCOR) 80 MG tablet Take 1 tablet (80 mg total) by mouth daily. 90 tablet 1   traMADol (ULTRAM) 50 MG tablet Take 1 tablet (50 mg total) by mouth every 6 (six) hours as  needed. 90 tablet 0   traZODone (DESYREL) 150 MG tablet TAKE 1/2 TO 1 TABLET 1 HOUR BEFORE BEDTIME FOR SLEEP 90 tablet 1   zolpidem (AMBIEN) 10 MG tablet Take 0.5-1 tablets (5-10 mg total) by mouth at bedtime as needed for sleep. 30 tablet 0   zolpidem (AMBIEN) 10 MG tablet Take 1 tablet (10 mg total) by mouth at bedtime as needed for sleep. 30 tablet 0   No current facility-administered medications for this visit.    PHYSICAL EXAMINATION: ECOG PERFORMANCE STATUS: 1 - Symptomatic but completely ambulatory  Vitals:   12/06/21 0909  BP: 113/71  Pulse: 79  Resp: 18  Temp: 98.1 F (36.7 C)  SpO2: 97%   Wt Readings from Last 3 Encounters:  12/06/21 160 lb 9.6 oz (72.8 kg)  11/22/21 165 lb 1 oz (74.9 kg)  10/29/21 159 lb (72.1 kg)     GENERAL:alert, no distress and comfortable SKIN: skin color normal, no rashes or significant lesions EYES: normal, Conjunctiva are pink and non-injected, sclera clear  NEURO: alert & oriented x 3 with fluent speech  LABORATORY DATA:  I have reviewed the data as listed CBC Latest Ref Rng & Units 12/06/2021 11/22/2021 10/29/2021  WBC 4.0 - 10.5 K/uL 9.4 5.3 12.7(H)  Hemoglobin 13.0 - 17.0 g/dL 13.3 12.4(L) 11.8(L)  Hematocrit 39.0 - 52.0 % 37.5(L) 36.5(L) 33.7(L)  Platelets 150 - 400 K/uL 88(L) 113(L) 90(L)     CMP Latest Ref Rng & Units 12/06/2021 11/22/2021 10/29/2021  Glucose 70  - 99 mg/dL 313(H) 250(H) 320(H)  BUN 8 - 23 mg/dL $Remove'14 15 9  'NsiSjZY$ Creatinine 0.61 - 1.24 mg/dL 0.84 0.81 0.86  Sodium 135 - 145 mmol/L 137 137 140  Potassium 3.5 - 5.1 mmol/L 3.9 4.0 3.6  Chloride 98 - 111 mmol/L 103 104 107  CO2 22 - 32 mmol/L $RemoveB'26 27 23  'eIHcVkkJ$ Calcium 8.9 - 10.3 mg/dL 9.5 9.2 8.6(L)  Total Protein 6.5 - 8.1 g/dL 6.9 6.6 6.3(L)  Total Bilirubin 0.3 - 1.2 mg/dL 0.5 0.5 0.5  Alkaline Phos 38 - 126 U/L 141(H) 94 159(H)  AST 15 - 41 U/L $Remo'17 19 21  'nCGXQ$ ALT 0 - 44 U/L $Remo'21 19 23      'GsbCR$ RADIOGRAPHIC STUDIES: I have personally reviewed the radiological images as listed and agreed with the findings in the report. No results found.    No orders of the defined types were placed in this encounter.  All questions were answered. The patient knows to call the clinic with any problems, questions or concerns. No barriers to learning was detected. The total time spent in the appointment was 30 minutes.     [name], MD 12/06/2021   I, Wilburn Mylar, am acting as scribe for [name], MD.   I have reviewed the above documentation for accuracy and completeness, and I agree with the above.

## 2021-12-06 NOTE — Patient Instructions (Signed)
[facility]  Discharge Instructions: Thank you for choosing [facility] to provide your oncology and hematology care.   If you have a lab appointment with the [facility], please go directly to the [facility] and check in at the registration area.   Wear comfortable clothing and clothing appropriate for easy access to any Portacath or PICC line.   We strive to give you quality time with your provider. You may need to reschedule your appointment if you arrive late (15 or more minutes).  Arriving late affects you and other patients whose appointments are after yours.  Also, if you miss three or more appointments without notifying the office, you may be dismissed from the clinic at the providers discretion.      For prescription refill requests, have your pharmacy contact our office and allow 72 hours for refills to be completed.    Today you received the following chemotherapy and/or immunotherapy agents irinotecan, leucovorin, oxaliplatin, fluorourcil      To help prevent nausea and vomiting after your treatment, we encourage you to take your nausea medication as directed.  BELOW ARE SYMPTOMS THAT SHOULD BE REPORTED IMMEDIATELY: *FEVER GREATER THAN 100.4 F (38 C) OR HIGHER *CHILLS OR SWEATING *NAUSEA AND VOMITING THAT IS NOT CONTROLLED WITH YOUR NAUSEA MEDICATION *UNUSUAL SHORTNESS OF BREATH *UNUSUAL BRUISING OR BLEEDING *URINARY PROBLEMS (pain or burning when urinating, or frequent urination) *BOWEL PROBLEMS (unusual diarrhea, constipation, pain near the anus) TENDERNESS IN MOUTH AND THROAT WITH OR WITHOUT PRESENCE OF ULCERS (sore throat, sores in mouth, or a toothache) UNUSUAL RASH, SWELLING OR PAIN  UNUSUAL VAGINAL DISCHARGE OR ITCHING   Items with * indicate a potential emergency and should be followed up as soon as possible or go to the Emergency Department if any problems should occur.  Please show the CHEMOTHERAPY ALERT CARD or  IMMUNOTHERAPY ALERT CARD at check-in to the Emergency Department and triage nurse.  Should you have questions after your visit or need to cancel or reschedule your appointment, please contact [facility]  Dept: [phone]  and follow the prompts.  Office hours are 8:00 a.m. to 4:30 p.m. Monday - Friday. Please note that voicemails left after 4:00 p.m. may not be returned until the following business day.  We are closed weekends and major holidays. You have access to a nurse at all times for urgent questions. Please call the main number to the clinic Dept: [phone] and follow the prompts.   For any non-urgent questions, you may also contact your provider using MyChart. We now offer e-Visits for anyone 40 and older to request care online for non-urgent symptoms. For details visit [email or web address].   Also download the MyChart app! Go to the app store, search "MyChart", open the app, select [facility], and log in with your MyChart username and password.  Due to Covid, a mask is required upon entering the hospital/clinic. If you do not have a mask, one will be given to you upon arrival. For doctor visits, patients may have 1 support person aged 10 or older with them. For treatment visits, patients cannot have anyone with them due to current Covid guidelines and our immunocompromised population.

## 2021-12-07 ENCOUNTER — Telehealth: Payer: Self-pay | Admitting: Hematology

## 2021-12-07 LAB — CANCER ANTIGEN 19-9: CA 19-9: 106 U/mL — ABNORMAL HIGH (ref 0–35)

## 2021-12-07 NOTE — Telephone Encounter (Signed)
Called pt to ask if he was interested in receiving IVfluids with PUMP D/C + inj per [facility] 1/19 inbasket, pt informed me he was doing well and is ok to receive just inj on 1/21. Left appt as scheduled.

## 2021-12-08 ENCOUNTER — Inpatient Hospital Stay: Payer: Medicare Other

## 2021-12-08 ENCOUNTER — Other Ambulatory Visit: Payer: Self-pay

## 2021-12-08 VITALS — BP 121/81 | HR 76 | Temp 98.1°F | Resp 16

## 2021-12-08 DIAGNOSIS — Z5111 Encounter for antineoplastic chemotherapy: Secondary | ICD-10-CM | POA: Diagnosis not present

## 2021-12-08 DIAGNOSIS — C259 Malignant neoplasm of pancreas, unspecified: Secondary | ICD-10-CM

## 2021-12-08 MED ORDER — HEPARIN SOD (PORK) LOCK FLUSH 100 UNIT/ML IV SOLN
500.0000 [IU] | Freq: Once | INTRAVENOUS | Status: AC | PRN
  Administered 2021-12-08: 500 [IU]

## 2021-12-08 MED ORDER — PEGFILGRASTIM-CBQV 6 MG/0.6ML ~~LOC~~ SOSY
6.0000 mg | PREFILLED_SYRINGE | Freq: Once | SUBCUTANEOUS | Status: AC
  Administered 2021-12-08: 6 mg via SUBCUTANEOUS

## 2021-12-08 MED ORDER — SODIUM CHLORIDE 0.9% FLUSH
10.0000 mL | INTRAVENOUS | Status: DC | PRN
  Administered 2021-12-08: 10 mL

## 2021-12-19 ENCOUNTER — Encounter: Payer: Self-pay | Admitting: Internal Medicine

## 2021-12-19 ENCOUNTER — Other Ambulatory Visit: Payer: Self-pay | Admitting: Nurse Practitioner

## 2021-12-19 ENCOUNTER — Other Ambulatory Visit: Payer: Self-pay | Admitting: Internal Medicine

## 2021-12-19 ENCOUNTER — Encounter: Payer: Self-pay | Admitting: Hematology

## 2021-12-19 ENCOUNTER — Other Ambulatory Visit: Payer: Self-pay | Admitting: Medical Oncology

## 2021-12-19 ENCOUNTER — Other Ambulatory Visit: Payer: Self-pay

## 2021-12-19 MED ORDER — GLIPIZIDE 5 MG PO TABS: ORAL_TABLET | ORAL | 0 refills | Status: DC

## 2021-12-19 MED ORDER — METFORMIN HCL ER 500 MG PO TB24
500.0000 mg | ORAL_TABLET | Freq: Three times a day (TID) | ORAL | 3 refills | Status: DC
  Filled 2021-12-19: qty 270, 90d supply, fill #0

## 2021-12-19 MED FILL — Fosaprepitant Dimeglumine For IV Infusion 150 MG (Base Eq): INTRAVENOUS | Qty: 5 | Status: AC

## 2021-12-20 ENCOUNTER — Other Ambulatory Visit: Payer: Self-pay

## 2021-12-20 ENCOUNTER — Encounter: Payer: Self-pay | Admitting: Hematology

## 2021-12-20 ENCOUNTER — Inpatient Hospital Stay (HOSPITAL_BASED_OUTPATIENT_CLINIC_OR_DEPARTMENT_OTHER): Payer: Medicare Other | Admitting: Hematology

## 2021-12-20 ENCOUNTER — Inpatient Hospital Stay: Payer: Medicare Other

## 2021-12-20 ENCOUNTER — Inpatient Hospital Stay: Payer: Medicare Other | Attending: Hematology

## 2021-12-20 VITALS — BP 122/79 | HR 84 | Temp 98.1°F | Resp 17 | Ht 71.0 in | Wt 160.4 lb

## 2021-12-20 DIAGNOSIS — R5383 Other fatigue: Secondary | ICD-10-CM | POA: Insufficient documentation

## 2021-12-20 DIAGNOSIS — E291 Testicular hypofunction: Secondary | ICD-10-CM | POA: Diagnosis not present

## 2021-12-20 DIAGNOSIS — G629 Polyneuropathy, unspecified: Secondary | ICD-10-CM | POA: Insufficient documentation

## 2021-12-20 DIAGNOSIS — F419 Anxiety disorder, unspecified: Secondary | ICD-10-CM | POA: Diagnosis not present

## 2021-12-20 DIAGNOSIS — C259 Malignant neoplasm of pancreas, unspecified: Secondary | ICD-10-CM

## 2021-12-20 DIAGNOSIS — E559 Vitamin D deficiency, unspecified: Secondary | ICD-10-CM | POA: Insufficient documentation

## 2021-12-20 DIAGNOSIS — C250 Malignant neoplasm of head of pancreas: Secondary | ICD-10-CM | POA: Diagnosis not present

## 2021-12-20 DIAGNOSIS — Z87442 Personal history of urinary calculi: Secondary | ICD-10-CM | POA: Insufficient documentation

## 2021-12-20 DIAGNOSIS — Z5111 Encounter for antineoplastic chemotherapy: Secondary | ICD-10-CM | POA: Diagnosis not present

## 2021-12-20 DIAGNOSIS — D61818 Other pancytopenia: Secondary | ICD-10-CM | POA: Diagnosis not present

## 2021-12-20 DIAGNOSIS — Z79899 Other long term (current) drug therapy: Secondary | ICD-10-CM | POA: Insufficient documentation

## 2021-12-20 DIAGNOSIS — Z5189 Encounter for other specified aftercare: Secondary | ICD-10-CM | POA: Insufficient documentation

## 2021-12-20 DIAGNOSIS — E1165 Type 2 diabetes mellitus with hyperglycemia: Secondary | ICD-10-CM | POA: Insufficient documentation

## 2021-12-20 DIAGNOSIS — G4700 Insomnia, unspecified: Secondary | ICD-10-CM | POA: Diagnosis not present

## 2021-12-20 DIAGNOSIS — E119 Type 2 diabetes mellitus without complications: Secondary | ICD-10-CM | POA: Insufficient documentation

## 2021-12-20 DIAGNOSIS — K5900 Constipation, unspecified: Secondary | ICD-10-CM | POA: Insufficient documentation

## 2021-12-20 DIAGNOSIS — Z95828 Presence of other vascular implants and grafts: Secondary | ICD-10-CM

## 2021-12-20 LAB — CMP (CANCER CENTER ONLY)
ALT: 24 U/L (ref 0–44)
AST: 19 U/L (ref 15–41)
Albumin: 3.9 g/dL (ref 3.5–5.0)
Alkaline Phosphatase: 158 U/L — ABNORMAL HIGH (ref 38–126)
Anion gap: 7 (ref 5–15)
BUN: 10 mg/dL (ref 8–23)
CO2: 27 mmol/L (ref 22–32)
Calcium: 9.4 mg/dL (ref 8.9–10.3)
Chloride: 106 mmol/L (ref 98–111)
Creatinine: 0.84 mg/dL (ref 0.61–1.24)
GFR, Estimated: >60 mL/min (ref >60)
Glucose, Bld: 269 mg/dL — ABNORMAL HIGH (ref 70–99)
Potassium: 3.9 mmol/L (ref 3.5–5.1)
Sodium: 140 mmol/L (ref 135–145)
Total Bilirubin: 0.4 mg/dL (ref 0.3–1.2)
Total Protein: 6.6 g/dL (ref 6.5–8.1)

## 2021-12-20 LAB — CBC WITH DIFFERENTIAL (CANCER CENTER ONLY)
Abs Immature Granulocytes: 0.19 10*3/uL — ABNORMAL HIGH (ref 0.00–0.07)
Basophils Absolute: 0.1 10*3/uL (ref 0.0–0.1)
Basophils Relative: 1 %
Eosinophils Absolute: 0.1 10*3/uL (ref 0.0–0.5)
Eosinophils Relative: 1 %
HCT: 36.3 % — ABNORMAL LOW (ref 39.0–52.0)
Hemoglobin: 12.8 g/dL — ABNORMAL LOW (ref 13.0–17.0)
Immature Granulocytes: 2 %
Lymphocytes Relative: 12 %
Lymphs Abs: 1.5 10*3/uL (ref 0.7–4.0)
MCH: 34 pg (ref 26.0–34.0)
MCHC: 35.3 g/dL (ref 30.0–36.0)
MCV: 96.5 fL (ref 80.0–100.0)
Monocytes Absolute: 0.9 10*3/uL (ref 0.1–1.0)
Monocytes Relative: 7 %
Neutro Abs: 9.5 10*3/uL — ABNORMAL HIGH (ref 1.7–7.7)
Neutrophils Relative %: 77 %
Platelet Count: 120 10*3/uL — ABNORMAL LOW (ref 150–400)
RBC: 3.76 MIL/uL — ABNORMAL LOW (ref 4.22–5.81)
RDW: 12.5 % (ref 11.5–15.5)
WBC Count: 12.3 10*3/uL — ABNORMAL HIGH (ref 4.0–10.5)
nRBC: 0 % (ref 0.0–0.2)

## 2021-12-20 MED ORDER — SODIUM CHLORIDE 0.9 % IV SOLN
2000.0000 mg/m2 | INTRAVENOUS | Status: DC
  Administered 2021-12-20: 3800 mg via INTRAVENOUS
  Filled 2021-12-20: qty 76

## 2021-12-20 MED ORDER — AMOXICILLIN-POT CLAVULANATE 875-125 MG PO TABS: 1.0000 | ORAL_TABLET | Freq: Two times a day (BID) | ORAL | 0 refills | Status: DC

## 2021-12-20 MED ORDER — OXALIPLATIN CHEMO INJECTION 100 MG/20ML
70.0000 mg/m2 | Freq: Once | INTRAVENOUS | Status: AC
  Administered 2021-12-20: 135 mg via INTRAVENOUS
  Filled 2021-12-20: qty 20

## 2021-12-20 MED ORDER — SODIUM CHLORIDE 0.9 % IV SOLN
150.0000 mg | Freq: Once | INTRAVENOUS | Status: AC
  Administered 2021-12-20: 150 mg via INTRAVENOUS
  Filled 2021-12-20: qty 150

## 2021-12-20 MED ORDER — DEXAMETHASONE SODIUM PHOSPHATE 10 MG/ML IJ SOLN
6.0000 mg | Freq: Once | INTRAMUSCULAR | Status: AC
  Administered 2021-12-20: 6 mg via INTRAVENOUS
  Filled 2021-12-20: qty 1

## 2021-12-20 MED ORDER — SODIUM CHLORIDE 0.9% FLUSH
10.0000 mL | Freq: Once | INTRAVENOUS | Status: AC
  Administered 2021-12-20: 10 mL

## 2021-12-20 MED ORDER — ZOLPIDEM TARTRATE 10 MG PO TABS: 10.0000 mg | ORAL_TABLET | Freq: Every evening | ORAL | 0 refills | Status: DC | PRN

## 2021-12-20 MED ORDER — SODIUM CHLORIDE 0.9 % IV SOLN
400.0000 mg/m2 | Freq: Once | INTRAVENOUS | Status: AC
  Administered 2021-12-20: 764 mg via INTRAVENOUS
  Filled 2021-12-20: qty 38.2

## 2021-12-20 MED ORDER — DEXTROSE 5 % IV SOLN: Freq: Once | INTRAVENOUS | Status: AC

## 2021-12-20 MED ORDER — PALONOSETRON HCL INJECTION 0.25 MG/5ML
0.2500 mg | Freq: Once | INTRAVENOUS | Status: AC
  Administered 2021-12-20: 0.25 mg via INTRAVENOUS
  Filled 2021-12-20: qty 5

## 2021-12-20 MED ORDER — SODIUM CHLORIDE 0.9 % IV SOLN
150.0000 mg/m2 | Freq: Once | INTRAVENOUS | Status: AC
  Administered 2021-12-20: 280 mg via INTRAVENOUS
  Filled 2021-12-20: qty 14

## 2021-12-20 MED ORDER — ATROPINE SULFATE 1 MG/ML IV SOLN
0.5000 mg | Freq: Once | INTRAVENOUS | Status: AC | PRN
  Administered 2021-12-20: 0.5 mg via INTRAVENOUS
  Filled 2021-12-20: qty 1

## 2021-12-20 NOTE — Progress Notes (Signed)
[facility]   Telephone:([phone]) [phone] [phone]) [phone]   Clinic Follow up Note   Patient Care Team: [name], MD as PCP - General (Internal Medicine) [name], NP as PCP - Hematology/Oncology (Nurse Practitioner) [name], MD as Consulting Physician (Orthopedic Surgery) [name], MD as Consulting Physician (Cardiology) [name], MD as Consulting Physician (Oncology) Royston Bake, RN as Oncology Nurse Navigator (Oncology)  Date of Service:  12/20/2021  CHIEF COMPLAINT: f/u of metastatic pancreatic cancer  CURRENT THERAPY:  First line FOLFIRINOX, q2 weeks, starting 08/13/21  ASSESSMENT & PLAN:  Zachary Reid is a 66 y.o. male with   1. Metastatic pancreatic adenocarcinoma, [id number] with numerous small cavitary lesions in bilateral lungs, MMR normal  -found incidentally on recent CT scan for left kidney stone.  He does have some nonspecific symptoms, including epigastric pain, chronic right flank pain, and a dry cough, as well as significant weight loss in the past several years. -He proceeded to colonoscopy and EUS on 07/26/21.  It demonstrated a solid-cystic mass in the genu-body of the pancreas, with endosonographic evidence of SMA abutment, T4N0. Cytology confirmed adenocarcinoma. Baseline CA 19-9 elevated to 273 on 07/03/21 -He began first-line mFOLFIRINOX on 08/13/21, tolerated moderately well with severe fatigue for 3-7 days, constipation, and mild pancytopenia. He is able to recover well. GCSF added with cycle 2 -restaging CT CAP 10/25/21 showed overall stable disease. His numerous lung nodules with cavitation are less solid on images which show response to treatment, his primary pancreatic tumor is also slightly smaller.  -his CA 19-9 is overall stable now, most recently 106 on 12/06/21. -labs reviewed, overall stable, will proceed at same reduced dose.  2. Symptom Management: congestion, abdominal pain, neuropathy -he reports congestion  for the last 3 months, producing mostly clear phlegm with some green coloration. He notes this was not present prior to starting chemo treatment. I will prescribe augmentin. 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 Will monitor.   2. Anxiety, Insomnia -anxiety began 2022. He was started on lexapro on 04/19/21 and wellbutrin on 06/05/21 but came off them in August due to constipation -reported new anxiety attacks since diagnosis. Rx Xanax on 07/12/21, helpful.  -he notes he still has insomnia. Ambien prescribed 07/27/21, brand is more effective than generic. I refilled today. -I previously reviewed that he should not take ambien, tramadol, or xanax at the same time. -F/up Dr. Michail Sermon   3. Genetic Testing -he reports pancreatic cancer in his mother, prostate cancer in his father, ovarian in PGM, and lung in PGF (smoker). -he proceeded with testing on 07/30/21. 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 Left kidney stone, right flank pain -f/u urology for kidney stone   5. DM, uncontrolled -he is scheduled to meet Dr. Loanne Drilling on 12/27/21.     PLAN: -proceed with C8 FOLFIRINOX with same reduced dose              -Udenyca and 1h IVF on day 3 -I called in Augmentin and refilled ambien. -lab, flush, f/u, and FOLFIRINOX in 2 and 4 weeks   No problem-specific Assessment & Plan notes found for this encounter.   SUMMARY OF ONCOLOGIC HISTORY: Oncology History Overview Note   Cancer Staging  Pancreatic adenocarcinoma [facility]) Staging form: Exocrine Pancreas, AJCC 8th Edition - Clinical stage from 07/26/2021: Stage IV (cT4, cN0, cM1) - Signed by [name], MD on 07/28/2021    Pancreatic  adenocarcinoma ([place])  07/02/2021 Initial Diagnosis   Pancreatic adenocarcinoma ([place])   07/26/2021 Cancer Staging   Staging form: Exocrine Pancreas, AJCC 8th Edition - Clinical stage from 07/26/2021: Stage IV (cT4, cN0, cM1) - Signed by [name], MD on  07/28/2021 Stage prefix: Initial diagnosis Total positive nodes: 0    08/13/2021 -  Chemotherapy   Patient is on Treatment Plan : PANCREAS Modified FOLFIRINOX q14d x 4 cycles      Genetic Testing   [facility] CancerNext-Expanded results (77 genes) were negative. No pathogenic variants were identified. 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 The report date is 08/15/2021.    The CancerNext-Expanded gene panel offered by [facility] and includes sequencing, rearrangement, and RNA analysis for the following 77 genes: AIP, ALK, APC, ATM, AXIN2, BAP1, BARD1, BLM, BMPR1A, BRCA1, BRCA2, BRIP1, CDC73, CDH1, CDK4, CDKN1B, CDKN2A, CHEK2, CTNNA1, DICER1, FANCC, FH, FLCN, GALNT12, KIF1B, LZTR1, MAX, MEN1, MET, MLH1, MSH2, MSH3, MSH6, MUTYH, NBN, NF1, NF2, NTHL1, PALB2, PHOX2B, PMS2, POT1, PRKAR1A, PTCH1, PTEN, RAD51C, RAD51D, RB1, RECQL, RET, SDHA, SDHAF2, SDHB, SDHC, SDHD, SMAD4, SMARCA4, SMARCB1, SMARCE1, STK11, SUFU, TMEM127, TP53, TSC1, TSC2, VHL and XRCC2 (sequencing and deletion/duplication); EGFR, EGLN1, HOXB13, KIT, MITF, PDGFRA, POLD1, and POLE (sequencing only); EPCAM and GREM1 (deletion/duplication only).     10/25/2021 Imaging   EXAM: CT CHEST, ABDOMEN, AND PELVIS WITH CONTRAST  IMPRESSION: 1. Innumerable bilateral pulmonary nodules, many of which are cavitary, consistent with metastatic disease. These nodules show no substantial change in are minimally progressed in the interval. 2. Mix cystic and solid lesion in the head and body of the pancreas is similar to prior and also comparing back to MRI 07/02/2021. 3. Hepatic cysts. 4. 8 mm nonobstructing left renal stone. 5. Aortic Atherosclerosis (ICD10-I70.0).      INTERVAL HISTORY:  Zachary Reid is here for a follow up of metastatic pancreatic cancer. He was last seen by me on 12/06/21. He presents to the clinic accompanied by his wife. He reports he is still having congestion and flank pain. He reports  the congestion has been present for three months and started after starting chemo. He notes his mucus is predominantly clear but sometimes green. He endorses trying Claritin, denies having tried antibiotics.   All other systems were reviewed with the patient and are negative.  MEDICAL HISTORY:  Past Medical History:  Diagnosis Date   Abnormal EKG    DM (diabetes mellitus) ([place])    Hyperlipidemia    Hypogonadism male    IBS (irritable bowel syndrome)    Obesity    BMI 31   pancreatic ca 06/28/2021   Vitamin D deficiency     SURGICAL HISTORY: Past Surgical History:  Procedure Laterality Date   APPENDECTOMY     BIOPSY  07/26/2021   Procedure: BIOPSY;  Surgeon: Irving Copas., MD;  Location: [facility];  Service: Gastroenterology;;   COLONOSCOPY WITH PROPOFOL N/A 07/26/2021   Procedure: COLONOSCOPY WITH PROPOFOL;  Surgeon: Irving Copas., MD;  Location: [facility];  Service: Gastroenterology;  Laterality: N/A;   ESOPHAGOGASTRODUODENOSCOPY (EGD) WITH PROPOFOL N/A 07/26/2021   Procedure: ESOPHAGOGASTRODUODENOSCOPY (EGD) WITH PROPOFOL;  Surgeon: Rush Landmark Telford Nab., MD;  Location: [facility];  Service: Gastroenterology;  Laterality: N/A;   EUS N/A 07/26/2021   Procedure: UPPER ENDOSCOPIC ULTRASOUND (EUS) RADIAL;  Surgeon: Irving Copas., MD;  Location: [facility];  Service: Gastroenterology;  Laterality: N/A;   FINE NEEDLE ASPIRATION  07/26/2021   Procedure: FINE NEEDLE ASPIRATION (FNA) LINEAR;  Surgeon: Rush Landmark Telford Nab., MD;  Location: [facility];  Service: Gastroenterology;;   IR IMAGING GUIDED PORT INSERTION  08/10/2021   POLYPECTOMY  07/26/2021   Procedure: POLYPECTOMY;  Surgeon: Rush Landmark Telford Nab., MD;  Location: [facility];  Service: Gastroenterology;;   SHOULDER SURGERY Right     I have reviewed the social history and family history with the patient and they are unchanged from previous note.  ALLERGIES:  is allergic to lipitor  [atorvastatin].  MEDICATIONS:  Current Outpatient Medications  Medication Sig Dispense Refill   amoxicillin-clavulanate (AUGMENTIN) 875-125 MG tablet Take 1 tablet by mouth 2 (two) times daily. 14 tablet 0   ALPRAZolam (XANAX) 0.25 MG tablet Take 1-2 tablets (0.25-0.5 mg total) by mouth 2 (two) times daily as needed for anxiety. 60 tablet 0   aspirin EC 81 MG tablet Take 81 mg by mouth in the morning and at bedtime. Swallow whole.     buPROPion (WELLBUTRIN XL) 300 MG 24 hr tablet Take 1 tablet every Morning for Mood, Focus & Concentration 90 tablet 3   Cholecalciferol (VITAMIN D3) 125 MCG (5000 UT) CAPS Take 5,000 Units by mouth in the morning and at bedtime.     dexamethasone (DECADRON) 4 MG tablet Take 1 tablet by mouth in the morning for 3-5 days after chemo, starting after pump disconnect, for nausea, low appetite, and fatigue 20 tablet 1   dronabinol (MARINOL) 5 MG capsule Take 1 capsule (5 mg total) by mouth 2 (two) times daily before a meal. 60 capsule 0   escitalopram (LEXAPRO) 20 MG tablet Take 1 tablet  Daily  for Mood & Chronic Anxiety 90 tablet 3   gabapentin (NEURONTIN) 600 MG tablet Take 1 tablet (600 mg total) by mouth 3 (three) times daily. 90 tablet 1   glipiZIDE (GLUCOTROL) 5 MG tablet Take 1 tablet 2 to 3 x /day with Meals for Diabetes 270 tablet 0   glucose blood test strip Use as instructed 100 each 12   hydrocortisone (ANUSOL-HC) 25 MG suppository Place 1 suppository (25 mg total) rectally at bedtime. QHS x 1-week and then Every Other night until prescription complete 12 suppository 0   hyoscyamine (LEVSIN SL) 0.125 MG SL tablet DISSOLVE ONE TABLET UNDER THE TONGUE 4 TIMES DAILY UP TO EVERY 4 HOURS AS NEEDED FOR NAUSEA, BLOATING, CRAMPING, OR DIARRHEA 100 tablet 0   lidocaine-prilocaine (EMLA) cream Apply to affected area once 30 g 3   metFORMIN (GLUCOPHAGE-XR) 500 MG 24 hr tablet TAKE 2 TABLETS TWICE A DAY WITH MEALS FOR DIABETES 360 tablet 3   ondansetron (ZOFRAN ODT) 4 MG  disintegrating tablet Take 1 tablet (4 mg total) by mouth every 8 (eight) hours as needed for nausea or vomiting. 20 tablet 0   ondansetron (ZOFRAN) 8 MG tablet Take 1 tablet (8 mg total) by mouth 2 (two) times daily as needed. Start on day 3 after chemotherapy. 30 tablet 1   oxyCODONE (ROXICODONE) 5 MG immediate release tablet Take 1 tablet (5 mg total) by mouth every 6 (six) hours as needed for up to 6 doses for severe pain. 30 tablet 0   prochlorperazine (COMPAZINE) 10 MG tablet Take 1 tablet (10 mg total) by mouth every 6 (six) hours as needed (Nausea or vomiting). 30 tablet 1   Red Yeast Rice 600 MG TABS Take 300 mg by mouth daily.     simvastatin (ZOCOR) 80 MG tablet Take 1 tablet (80 mg total) by mouth daily. 90 tablet 1   traMADol (ULTRAM) 50  MG tablet Take 1 tablet (50 mg total) by mouth every 6 (six) hours as needed. 90 tablet 0   traZODone (DESYREL) 150 MG tablet TAKE 1/2 TO 1 TABLET 1 HOUR BEFORE BEDTIME FOR SLEEP 90 tablet 1   zolpidem (AMBIEN) 10 MG tablet Take 0.5-1 tablets (5-10 mg total) by mouth at bedtime as needed for sleep. 30 tablet 0   zolpidem (AMBIEN) 10 MG tablet Take 1 tablet (10 mg total) by mouth at bedtime as needed for sleep. 30 tablet 0   No current facility-administered medications for this visit.   Facility-Administered Medications Ordered in Other Visits  Medication Dose Route Frequency Provider Last Rate Last Admin   fluorouracil (ADRUCIL) 3,800 mg in sodium chloride 0.9 % 74 mL chemo infusion  2,000 mg/m2 (Treatment Plan Recorded) Intravenous 1 day or 1 dose [name], MD       irinotecan (CAMPTOSAR) 280 mg in sodium chloride 0.9 % 500 mL chemo infusion  150 mg/m2 (Treatment Plan Recorded) Intravenous Once [name], MD 343 mL/hr at 12/20/21 1305 280 mg at 12/20/21 1305   leucovorin 764 mg in sodium chloride 0.9 % 250 mL infusion  400 mg/m2 (Treatment Plan Recorded) Intravenous Once [name], MD 192 mL/hr at 12/20/21 1308 764 mg at 12/20/21 1308    PHYSICAL  EXAMINATION: ECOG PERFORMANCE STATUS: 1 - Symptomatic but completely ambulatory  Vitals:   12/20/21 0849  BP: 122/79  Pulse: 84  Resp: 17  Temp: 98.1 F (36.7 C)  SpO2: 98%   Wt Readings from Last 3 Encounters:  12/20/21 160 lb 6.4 oz (72.8 kg)  12/06/21 160 lb 9.6 oz (72.8 kg)  11/22/21 165 lb 1 oz (74.9 kg)     GENERAL:alert, no distress and comfortable SKIN: skin color normal, no rashes or significant lesions EYES: normal, Conjunctiva are pink and non-injected, sclera clear  NEURO: alert & oriented x 3 with fluent speech  LABORATORY DATA:  I have reviewed the data as listed CBC Latest Ref Rng & Units 12/20/2021 12/06/2021 11/22/2021  WBC 4.0 - 10.5 K/uL 12.3(H) 9.4 5.3  Hemoglobin 13.0 - 17.0 g/dL 12.8(L) 13.3 12.4(L)  Hematocrit 39.0 - 52.0 % 36.3(L) 37.5(L) 36.5(L)  Platelets 150 - 400 K/uL 120(L) 88(L) 113(L)     CMP Latest Ref Rng & Units 12/20/2021 12/06/2021 11/22/2021  Glucose 70 - 99 mg/dL 269(H) 313(H) 250(H)  BUN 8 - 23 mg/dL $Remove'10 14 15  'ckegqbn$ Creatinine 0.61 - 1.24 mg/dL 0.84 0.84 0.81  Sodium 135 - 145 mmol/L 140 137 137  Potassium 3.5 - 5.1 mmol/L 3.9 3.9 4.0  Chloride 98 - 111 mmol/L 106 103 104  CO2 22 - 32 mmol/L $RemoveB'27 26 27  'IVlXOHRp$ Calcium 8.9 - 10.3 mg/dL 9.4 9.5 9.2  Total Protein 6.5 - 8.1 g/dL 6.6 6.9 6.6  Total Bilirubin 0.3 - 1.2 mg/dL 0.4 0.5 0.5  Alkaline Phos 38 - 126 U/L 158(H) 141(H) 94  AST 15 - 41 U/L $Remo'19 17 19  'MZxRj$ ALT 0 - 44 U/L $Remo'24 21 19      'ZZsuT$ RADIOGRAPHIC STUDIES: I have personally reviewed the radiological images as listed and agreed with the findings in the report. No results found.    No orders of the defined types were placed in this encounter.  All questions were answered. The patient knows to call the clinic with any problems, questions or concerns. No barriers to learning was detected. The total time spent in the appointment was 30 minutes.     [name], MD 12/20/2021  I, Wilburn Mylar, am acting as scribe for [name], MD.   I have  reviewed the above documentation for accuracy and completeness, and I agree with the above.

## 2021-12-20 NOTE — Patient Instructions (Signed)
[facility]   Discharge Instructions: Thank you for choosing [facility] to provide your oncology and hematology care.   If you have a lab appointment with the [facility], please go directly to the [facility] and check in at the registration area.   Wear comfortable clothing and clothing appropriate for easy access to any Portacath or PICC line.   We strive to give you quality time with your provider. You may need to reschedule your appointment if you arrive late (15 or more minutes).  Arriving late affects you and other patients whose appointments are after yours.  Also, if you miss three or more appointments without notifying the office, you may be dismissed from the clinic at the providers discretion.      For prescription refill requests, have your pharmacy contact our office and allow 72 hours for refills to be completed.    Today you received the following chemotherapy and/or immunotherapy agents: oxaliplatin, irinotecan, leucovorin, and fluorouracil.      To help prevent nausea and vomiting after your treatment, we encourage you to take your nausea medication as directed.  BELOW ARE SYMPTOMS THAT SHOULD BE REPORTED IMMEDIATELY: *FEVER GREATER THAN 100.4 F (38 C) OR HIGHER *CHILLS OR SWEATING *NAUSEA AND VOMITING THAT IS NOT CONTROLLED WITH YOUR NAUSEA MEDICATION *UNUSUAL SHORTNESS OF BREATH *UNUSUAL BRUISING OR BLEEDING *URINARY PROBLEMS (pain or burning when urinating, or frequent urination) *BOWEL PROBLEMS (unusual diarrhea, constipation, pain near the anus) TENDERNESS IN MOUTH AND THROAT WITH OR WITHOUT PRESENCE OF ULCERS (sore throat, sores in mouth, or a toothache) UNUSUAL RASH, SWELLING OR PAIN  UNUSUAL VAGINAL DISCHARGE OR ITCHING   Items with * indicate a potential emergency and should be followed up as soon as possible or go to the Emergency Department if any problems should occur.  Please show the CHEMOTHERAPY ALERT  CARD or IMMUNOTHERAPY ALERT CARD at check-in to the Emergency Department and triage nurse.  Should you have questions after your visit or need to cancel or reschedule your appointment, please contact [facility]  Dept: [phone]  and follow the prompts.  Office hours are 8:00 a.m. to 4:30 p.m. Monday - Friday. Please note that voicemails left after 4:00 p.m. may not be returned until the following business day.  We are closed weekends and major holidays. You have access to a nurse at all times for urgent questions. Please call the main number to the clinic Dept: [phone] and follow the prompts.   For any non-urgent questions, you may also contact your provider using MyChart. We now offer e-Visits for anyone 19 and older to request care online for non-urgent symptoms. For details visit [email or web address].   Also download the MyChart app! Go to the app store, search "MyChart", open the app, select [facility], and log in with your MyChart username and password.  Due to Covid, a mask is required upon entering the hospital/clinic. If you do not have a mask, one will be given to you upon arrival. For doctor visits, patients may have 1 support person aged 66 or older with them. For treatment visits, patients cannot have anyone with them due to current Covid guidelines and our immunocompromised population.

## 2021-12-22 ENCOUNTER — Inpatient Hospital Stay: Payer: Medicare Other

## 2021-12-22 ENCOUNTER — Other Ambulatory Visit: Payer: Self-pay

## 2021-12-22 VITALS — BP 129/76 | HR 69 | Temp 97.7°F | Resp 18 | Ht 71.0 in

## 2021-12-22 DIAGNOSIS — Z95828 Presence of other vascular implants and grafts: Secondary | ICD-10-CM

## 2021-12-22 DIAGNOSIS — Z5111 Encounter for antineoplastic chemotherapy: Secondary | ICD-10-CM | POA: Diagnosis not present

## 2021-12-22 DIAGNOSIS — C259 Malignant neoplasm of pancreas, unspecified: Secondary | ICD-10-CM

## 2021-12-22 MED ORDER — SODIUM CHLORIDE 0.9 % IV SOLN: INTRAVENOUS | Status: AC

## 2021-12-22 MED ORDER — SODIUM CHLORIDE 0.9% FLUSH
10.0000 mL | Freq: Once | INTRAVENOUS | Status: AC
  Administered 2021-12-22: 10 mL

## 2021-12-22 MED ORDER — HEPARIN SOD (PORK) LOCK FLUSH 100 UNIT/ML IV SOLN
500.0000 [IU] | Freq: Once | INTRAVENOUS | Status: AC
  Administered 2021-12-22: 500 [IU]

## 2021-12-22 MED ORDER — PEGFILGRASTIM-CBQV 6 MG/0.6ML ~~LOC~~ SOSY
6.0000 mg | PREFILLED_SYRINGE | Freq: Once | SUBCUTANEOUS | Status: AC
  Administered 2021-12-22: 6 mg via SUBCUTANEOUS
  Filled 2021-12-22: qty 0.6

## 2021-12-22 NOTE — Patient Instructions (Signed)

## 2021-12-27 ENCOUNTER — Ambulatory Visit (INDEPENDENT_AMBULATORY_CARE_PROVIDER_SITE_OTHER): Payer: Medicare Other | Admitting: Endocrinology

## 2021-12-27 ENCOUNTER — Encounter: Payer: Self-pay | Admitting: Endocrinology

## 2021-12-27 ENCOUNTER — Other Ambulatory Visit: Payer: Self-pay

## 2021-12-27 VITALS — BP 118/72 | HR 82 | Ht 71.0 in | Wt 152.0 lb

## 2021-12-27 DIAGNOSIS — E1129 Type 2 diabetes mellitus with other diabetic kidney complication: Secondary | ICD-10-CM

## 2021-12-27 DIAGNOSIS — E1165 Type 2 diabetes mellitus with hyperglycemia: Secondary | ICD-10-CM | POA: Diagnosis not present

## 2021-12-27 DIAGNOSIS — E1122 Type 2 diabetes mellitus with diabetic chronic kidney disease: Secondary | ICD-10-CM

## 2021-12-27 LAB — POCT GLYCOSYLATED HEMOGLOBIN (HGB A1C): Hemoglobin A1C: 10 % — AB (ref 4.0–5.6)

## 2021-12-27 MED ORDER — LEVEMIR FLEXTOUCH 100 UNIT/ML ~~LOC~~ SOPN: 10.0000 [IU] | PEN_INJECTOR | SUBCUTANEOUS | 3 refills | Status: DC

## 2021-12-27 NOTE — Progress Notes (Signed)
Subjective:    Patient ID: Zachary Reid, male    DOB: 1956/05/25, 66 y.o.   MRN: [id number]  HPI pt is referred by Dr Burr Medico, for diabetes.  Pt states DM was dx'ed in 0973; it is complicated by PAD; he has never been on insulin; he has never had severe hypoglycemia or DKA.  He takes chemotx for panc cancer.  He has lost 40 lbs x 1 year.  Denies intermitt n/v, with chemotx.  He says cbg varies from 112-340.  He checks fasting only.  Pt says he can use an insulin pen.   Past Medical History:  Diagnosis Date   Abnormal EKG    DM (diabetes mellitus) ([place])    Hyperlipidemia    Hypogonadism male    IBS (irritable bowel syndrome)    Obesity    BMI 31   pancreatic ca 06/28/2021   Vitamin D deficiency     Past Surgical History:  Procedure Laterality Date   APPENDECTOMY     BIOPSY  07/26/2021   Procedure: BIOPSY;  Surgeon: Irving Copas., MD;  Location: [facility];  Service: Gastroenterology;;   COLONOSCOPY WITH PROPOFOL N/A 07/26/2021   Procedure: COLONOSCOPY WITH PROPOFOL;  Surgeon: Irving Copas., MD;  Location: [facility];  Service: Gastroenterology;  Laterality: N/A;   ESOPHAGOGASTRODUODENOSCOPY (EGD) WITH PROPOFOL N/A 07/26/2021   Procedure: ESOPHAGOGASTRODUODENOSCOPY (EGD) WITH PROPOFOL;  Surgeon: Rush Landmark Telford Nab., MD;  Location: [facility];  Service: Gastroenterology;  Laterality: N/A;   EUS N/A 07/26/2021   Procedure: UPPER ENDOSCOPIC ULTRASOUND (EUS) RADIAL;  Surgeon: Irving Copas., MD;  Location: [facility];  Service: Gastroenterology;  Laterality: N/A;   FINE NEEDLE ASPIRATION  07/26/2021   Procedure: FINE NEEDLE ASPIRATION (FNA) LINEAR;  Surgeon: Irving Copas., MD;  Location: [facility];  Service: Gastroenterology;;   IR IMAGING GUIDED PORT INSERTION  08/10/2021   POLYPECTOMY  07/26/2021   Procedure: POLYPECTOMY;  Surgeon: Irving Copas., MD;  Location: [facility] ENDOSCOPY;  Service: Gastroenterology;;   SHOULDER SURGERY Right      Social History   Socioeconomic History   Marital status: Married    Spouse name: Not on file   Number of children: 1   Years of education: Not on file   Highest education level: Not on file  Occupational History   Not on file  Tobacco Use   Smoking status: Never   Smokeless tobacco: Never  Substance and Sexual Activity   Alcohol use: No   Drug use: No   Sexual activity: Never  Other Topics Concern   Not on file  Social History Narrative   Not on file   Social Determinants of Health   Financial Resource Strain: Not on file  Food Insecurity: Not on file  Transportation Needs: Not on file  Physical Activity: Not on file  Stress: Not on file  Social Connections: Not on file  Intimate Partner Violence: Not on file    Current Outpatient Medications on File Prior to Visit  Medication Sig Dispense Refill   ALPRAZolam (XANAX) 0.25 MG tablet Take 1-2 tablets (0.25-0.5 mg total) by mouth 2 (two) times daily as needed for anxiety. 60 tablet 0   amoxicillin-clavulanate (AUGMENTIN) 875-125 MG tablet Take 1 tablet by mouth 2 (two) times daily. 14 tablet 0   aspirin EC 81 MG tablet Take 81 mg by mouth in the morning and at bedtime. Swallow whole.     buPROPion (WELLBUTRIN XL) 300 MG 24 hr tablet Take 1 tablet every Morning for  Mood, Focus & Concentration 90 tablet 3   Cholecalciferol (VITAMIN D3) 125 MCG (5000 UT) CAPS Take 5,000 Units by mouth in the morning and at bedtime.     dexamethasone (DECADRON) 4 MG tablet Take 1 tablet by mouth in the morning for 3-5 days after chemo, starting after pump disconnect, for nausea, low appetite, and fatigue 20 tablet 1   dronabinol (MARINOL) 5 MG capsule Take 1 capsule (5 mg total) by mouth 2 (two) times daily before a meal. 60 capsule 0   escitalopram (LEXAPRO) 20 MG tablet Take 1 tablet  Daily  for Mood & Chronic Anxiety 90 tablet 3   gabapentin (NEURONTIN) 600 MG tablet Take 1 tablet (600 mg total) by mouth 3 (three) times daily. 90 tablet 1    glipiZIDE (GLUCOTROL) 5 MG tablet Take 1 tablet 2 to 3 x /day with Meals for Diabetes 270 tablet 0   glucose blood test strip Use as instructed 100 each 12   hydrocortisone (ANUSOL-HC) 25 MG suppository Place 1 suppository (25 mg total) rectally at bedtime. QHS x 1-week and then Every Other night until prescription complete 12 suppository 0   hyoscyamine (LEVSIN SL) 0.125 MG SL tablet DISSOLVE ONE TABLET UNDER THE TONGUE 4 TIMES DAILY UP TO EVERY 4 HOURS AS NEEDED FOR NAUSEA, BLOATING, CRAMPING, OR DIARRHEA 100 tablet 0   lidocaine-prilocaine (EMLA) cream Apply to affected area once 30 g 3   metFORMIN (GLUCOPHAGE-XR) 500 MG 24 hr tablet TAKE 2 TABLETS TWICE A DAY WITH MEALS FOR DIABETES 360 tablet 3   ondansetron (ZOFRAN ODT) 4 MG disintegrating tablet Take 1 tablet (4 mg total) by mouth every 8 (eight) hours as needed for nausea or vomiting. 20 tablet 0   ondansetron (ZOFRAN) 8 MG tablet Take 1 tablet (8 mg total) by mouth 2 (two) times daily as needed. Start on day 3 after chemotherapy. 30 tablet 1   oxyCODONE (ROXICODONE) 5 MG immediate release tablet Take 1 tablet (5 mg total) by mouth every 6 (six) hours as needed for up to 6 doses for severe pain. 30 tablet 0   prochlorperazine (COMPAZINE) 10 MG tablet Take 1 tablet (10 mg total) by mouth every 6 (six) hours as needed (Nausea or vomiting). 30 tablet 1   Red Yeast Rice 600 MG TABS Take 300 mg by mouth daily.     simvastatin (ZOCOR) 80 MG tablet Take 1 tablet (80 mg total) by mouth daily. 90 tablet 1   traMADol (ULTRAM) 50 MG tablet Take 1 tablet (50 mg total) by mouth every 6 (six) hours as needed. 90 tablet 0   traZODone (DESYREL) 150 MG tablet TAKE 1/2 TO 1 TABLET 1 HOUR BEFORE BEDTIME FOR SLEEP 90 tablet 1   zolpidem (AMBIEN) 10 MG tablet Take 1 tablet (10 mg total) by mouth at bedtime as needed for sleep. 30 tablet 0   zolpidem (AMBIEN) 10 MG tablet Take 0.5-1 tablets (5-10 mg total) by mouth at bedtime as needed for sleep. 30 tablet 0    No current facility-administered medications on file prior to visit.    Allergies  Allergen Reactions   Lipitor [Atorvastatin]     fatigue    Family History  Problem Relation Age of Onset   Pancreatic cancer Mother 67   Prostate cancer Father 17       Prostate   Diabetes Father    Hypertension Father    Drug abuse Brother    Kidney Stones Brother    Ovarian cancer Paternal Grandmother  41   Lung cancer Paternal Grandfather        Cigar smoker   Colon cancer Neg Hx    Colon polyps Neg Hx    Esophageal cancer Neg Hx    Stomach cancer Neg Hx    Rectal cancer Neg Hx     BP 118/72 (BP Location: Left Arm, Patient Position: Sitting, Cuff Size: Normal)    Pulse 82    Ht 5\' 11"  (1.803 m)    Wt 152 lb (68.9 kg)    SpO2 98%    BMI 21.20 kg/m    Review of Systems denies sob.      Objective:   Physical Exam VITAL SIGNS:  See vs page GENERAL: no distress Pulses: dorsalis pedis intact bilat.   MSK: no deformity of the feet CV: no leg edema Skin:  no ulcer on the feet.  normal color and temp on the feet. Neuro: sensation is intact to touch on the feet   A1c=10.0%  I have reviewed outside records, and summarized: Pt was noted to have elevated A1c, and referred here.  His CT and tumor markers improved with chemotx, but did not approach normal     Assessment & Plan:  Type 2 DM: uncontrolled Panc CA.  We discussed benefit of insulin here, as this is an anabolic hormone  Patient Instructions  good diet and exercise significantly improve the control of your diabetes.  please let me know if you wish to be referred to a dietician.  high blood sugar is very risky to your health.  you should see an eye doctor and dentist every year.  It is very important to get all recommended vaccinations.  Controlling your blood pressure and cholesterol drastically reduces the damage diabetes does to your body.  Those who smoke should quit.  Please discuss these with your doctor.  check your  blood sugar twice a day.  vary the time of day when you check, between before the 3 meals, and at bedtime.  also check if you have symptoms of your blood sugar being too high or too low.  please keep a record of the readings and bring it to your next appointment here (or you can bring the meter itself).  You can write it on any piece of paper.  please call us sooner if your blood sugar goes below 70, or if most of your readings are over 200.   We will need to take this complex situation in stages.  I have sent a prescription to your pharmacy, to add Levemir. Please continue the same other diabetes medications, but we'll phase these out as we can.   Please call or message [place] next week, to tell us how the blood sugar is doing.   Please come back for a follow-up appointment in 1 month.

## 2021-12-27 NOTE — Progress Notes (Signed)
1 

## 2021-12-27 NOTE — Patient Instructions (Addendum)
good diet and exercise significantly improve the control of your diabetes.  please let me know if you wish to be referred to a dietician.  high blood sugar is very risky to your health.  you should see an eye doctor and dentist every year.  It is very important to get all recommended vaccinations.  Controlling your blood pressure and cholesterol drastically reduces the damage diabetes does to your body.  Those who smoke should quit.  Please discuss these with your doctor.  check your blood sugar twice a day.  vary the time of day when you check, between before the 3 meals, and at bedtime.  also check if you have symptoms of your blood sugar being too high or too low.  please keep a record of the readings and bring it to your next appointment here (or you can bring the meter itself).  You can write it on any piece of paper.  please call us sooner if your blood sugar goes below 70, or if most of your readings are over 200.   We will need to take this complex situation in stages.  I have sent a prescription to your pharmacy, to add Levemir. Please continue the same other diabetes medications, but we'll phase these out as we can.   Please call or message [place] next week, to tell us how the blood sugar is doing.   Please come back for a follow-up appointment in 1 month.

## 2021-12-31 ENCOUNTER — Telehealth: Payer: Self-pay

## 2021-12-31 ENCOUNTER — Other Ambulatory Visit (HOSPITAL_COMMUNITY): Payer: Self-pay

## 2021-12-31 ENCOUNTER — Other Ambulatory Visit: Payer: Self-pay | Admitting: Endocrinology

## 2021-12-31 MED ORDER — LANTUS SOLOSTAR 100 UNIT/ML ~~LOC~~ SOPN: 6.0000 [IU] | PEN_INJECTOR | SUBCUTANEOUS | 99 refills | Status: DC

## 2022-01-01 NOTE — Telephone Encounter (Signed)
Message sent thru [place] notifying pt of change to Lantus per Loanne Drilling.

## 2022-01-02 MED FILL — Fosaprepitant Dimeglumine For IV Infusion 150 MG (Base Eq): INTRAVENOUS | Qty: 5 | Status: AC

## 2022-01-03 ENCOUNTER — Inpatient Hospital Stay: Payer: Medicare Other

## 2022-01-03 ENCOUNTER — Inpatient Hospital Stay (HOSPITAL_BASED_OUTPATIENT_CLINIC_OR_DEPARTMENT_OTHER): Payer: Medicare Other | Admitting: Hematology

## 2022-01-03 ENCOUNTER — Encounter: Payer: Self-pay | Admitting: Hematology

## 2022-01-03 ENCOUNTER — Other Ambulatory Visit: Payer: Self-pay

## 2022-01-03 VITALS — BP 129/78 | HR 81 | Temp 98.3°F | Resp 17 | Ht 71.0 in | Wt 161.1 lb

## 2022-01-03 DIAGNOSIS — C259 Malignant neoplasm of pancreas, unspecified: Secondary | ICD-10-CM

## 2022-01-03 DIAGNOSIS — Z5111 Encounter for antineoplastic chemotherapy: Secondary | ICD-10-CM | POA: Diagnosis not present

## 2022-01-03 DIAGNOSIS — Z95828 Presence of other vascular implants and grafts: Secondary | ICD-10-CM

## 2022-01-03 LAB — CMP (CANCER CENTER ONLY)
ALT: 24 U/L (ref 0–44)
AST: 21 U/L (ref 15–41)
Albumin: 3.9 g/dL (ref 3.5–5.0)
Alkaline Phosphatase: 135 U/L — ABNORMAL HIGH (ref 38–126)
Anion gap: 7 (ref 5–15)
BUN: 13 mg/dL (ref 8–23)
CO2: 26 mmol/L (ref 22–32)
Calcium: 9.2 mg/dL (ref 8.9–10.3)
Chloride: 109 mmol/L (ref 98–111)
Creatinine: 0.74 mg/dL (ref 0.61–1.24)
GFR, Estimated: >60 mL/min (ref >60)
Glucose, Bld: 190 mg/dL — ABNORMAL HIGH (ref 70–99)
Potassium: 3.5 mmol/L (ref 3.5–5.1)
Sodium: 142 mmol/L (ref 135–145)
Total Bilirubin: 0.4 mg/dL (ref 0.3–1.2)
Total Protein: 6.4 g/dL — ABNORMAL LOW (ref 6.5–8.1)

## 2022-01-03 LAB — CBC WITH DIFFERENTIAL (CANCER CENTER ONLY)
Abs Immature Granulocytes: 0.1 10*3/uL — ABNORMAL HIGH (ref 0.00–0.07)
Basophils Absolute: 0 10*3/uL (ref 0.0–0.1)
Basophils Relative: 0 %
Eosinophils Absolute: 0.1 10*3/uL (ref 0.0–0.5)
Eosinophils Relative: 1 %
HCT: 36 % — ABNORMAL LOW (ref 39.0–52.0)
Hemoglobin: 12.1 g/dL — ABNORMAL LOW (ref 13.0–17.0)
Immature Granulocytes: 1 %
Lymphocytes Relative: 14 %
Lymphs Abs: 1.4 10*3/uL (ref 0.7–4.0)
MCH: 32.6 pg (ref 26.0–34.0)
MCHC: 33.6 g/dL (ref 30.0–36.0)
MCV: 97 fL (ref 80.0–100.0)
Monocytes Absolute: 0.8 10*3/uL (ref 0.1–1.0)
Monocytes Relative: 8 %
Neutro Abs: 7.4 10*3/uL (ref 1.7–7.7)
Neutrophils Relative %: 76 %
Platelet Count: 104 10*3/uL — ABNORMAL LOW (ref 150–400)
RBC: 3.71 MIL/uL — ABNORMAL LOW (ref 4.22–5.81)
RDW: 12.9 % (ref 11.5–15.5)
WBC Count: 9.8 10*3/uL (ref 4.0–10.5)
nRBC: 0 % (ref 0.0–0.2)

## 2022-01-03 MED ORDER — ATROPINE SULFATE 1 MG/ML IV SOLN
0.5000 mg | Freq: Once | INTRAVENOUS | Status: AC | PRN
  Administered 2022-01-03: 0.5 mg via INTRAVENOUS
  Filled 2022-01-03: qty 1

## 2022-01-03 MED ORDER — SODIUM CHLORIDE 0.9 % IV SOLN
400.0000 mg/m2 | Freq: Once | INTRAVENOUS | Status: DC
  Filled 2022-01-03: qty 38.2

## 2022-01-03 MED ORDER — SODIUM CHLORIDE 0.9 % IV SOLN
150.0000 mg | Freq: Once | INTRAVENOUS | Status: AC
  Administered 2022-01-03: 150 mg via INTRAVENOUS
  Filled 2022-01-03: qty 150

## 2022-01-03 MED ORDER — SODIUM CHLORIDE 0.9% FLUSH
10.0000 mL | Freq: Once | INTRAVENOUS | Status: AC
  Administered 2022-01-03: 10 mL

## 2022-01-03 MED ORDER — OXALIPLATIN CHEMO INJECTION 100 MG/20ML
70.0000 mg/m2 | Freq: Once | INTRAVENOUS | Status: AC
  Administered 2022-01-03: 135 mg via INTRAVENOUS
  Filled 2022-01-03: qty 27

## 2022-01-03 MED ORDER — PALONOSETRON HCL INJECTION 0.25 MG/5ML
0.2500 mg | Freq: Once | INTRAVENOUS | Status: AC
  Administered 2022-01-03: 0.25 mg via INTRAVENOUS
  Filled 2022-01-03: qty 5

## 2022-01-03 MED ORDER — DEXTROSE 5 % IV SOLN: Freq: Once | INTRAVENOUS | Status: AC

## 2022-01-03 MED ORDER — SODIUM CHLORIDE 0.9 % IV SOLN
2000.0000 mg/m2 | INTRAVENOUS | Status: DC
  Administered 2022-01-03: 3800 mg via INTRAVENOUS
  Filled 2022-01-03: qty 76

## 2022-01-03 MED ORDER — DEXAMETHASONE SODIUM PHOSPHATE 10 MG/ML IJ SOLN
6.0000 mg | Freq: Once | INTRAMUSCULAR | Status: AC
  Administered 2022-01-03: 6 mg via INTRAVENOUS
  Filled 2022-01-03: qty 1

## 2022-01-03 MED ORDER — SODIUM CHLORIDE 0.9 % IV SOLN
400.0000 mg/m2 | Freq: Once | INTRAVENOUS | Status: AC
  Administered 2022-01-03: 764 mg via INTRAVENOUS
  Filled 2022-01-03: qty 38.2

## 2022-01-03 MED ORDER — SODIUM CHLORIDE 0.9 % IV SOLN
150.0000 mg/m2 | Freq: Once | INTRAVENOUS | Status: AC
  Administered 2022-01-03: 280 mg via INTRAVENOUS
  Filled 2022-01-03: qty 14

## 2022-01-03 NOTE — Progress Notes (Signed)
[place]   [phone] [phone]   Clinic Follow up Note   Patient Care Team: [name], MD as PCP - General (Internal Medicine) [name], NP as PCP - Hematology/Oncology (Nurse Practitioner) [name], MD as Consulting Physician (Orthopedic Surgery) [name], MD as Consulting Physician (Cardiology) [name], MD as Consulting Physician (Oncology) Royston Bake, RN as Oncology Nurse Navigator (Oncology)  Date of Service:  01/03/2022  CHIEF COMPLAINT: f/u of metastatic pancreatic cancer  CURRENT THERAPY:  First line FOLFIRINOX, q2 weeks, starting 08/13/21  ASSESSMENT & PLAN:  Zachary Reid is a 66 y.o. male with   1. Metastatic pancreatic adenocarcinoma, [id number] with numerous small cavitary lesions in bilateral lungs, MMR normal  -found incidentally on recent CT scan for left kidney stone.  He does have some nonspecific symptoms, including epigastric pain, chronic right flank pain, and a dry cough, as well as significant weight loss in the past several years. -He proceeded to colonoscopy and EUS on 07/26/21.  It demonstrated a solid-cystic mass in the genu-body of the pancreas, with endosonographic evidence of SMA abutment, T4N0. Cytology confirmed adenocarcinoma. Baseline CA 19-9 elevated to 273 on 07/03/21 -He began first-line mFOLFIRINOX on 08/13/21, tolerated moderately well with severe fatigue for 3-7 days, constipation, and mild pancytopenia. He is able to recover well. GCSF added with cycle 2 -restaging CT CAP 10/25/21 showed overall stable disease. His numerous lung nodules with cavitation are less solid on images which show response to treatment, his primary pancreatic tumor is also slightly smaller. We will plan for repeat scan in mid March. -his CA 19-9 is overall stable now, most recently 106 on 12/06/21. Today's result is pending. -labs reviewed, overall stable, will proceed at same reduced dose. -he notes he hopes  to go on a trip in May. We are happy to arrange chemo around this.   2. Symptom Management: congestion, abdominal pain, neuropathy, nausea -he reports productive congestion for the last 3 months He notes began after starting chemo. I prescribed augmentin, but he did not take it; he feels it is improving on its own. -he also reports intermittent abdominal pain; he uses tramadol as needed, up to twice a day. 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 Will monitor.   2. Anxiety, Insomnia -anxiety began 2022. He was started on lexapro on 04/19/21 and wellbutrin on 06/05/21 but came off them in August due to constipation -reported new anxiety attacks since diagnosis. Rx Xanax on 07/12/21, helpful. We reviewed that Xanax is to be used as needed, which he is doing. -he notes he still has insomnia. Ambien prescribed 07/27/21, brand is more effective than generic.  -I previously reviewed that he should not take ambien, tramadol, or xanax at the same time. -he was previously referred to Dr. Michail Sermon, did not find this helpful and does not wish to f/u.   3. Genetic Testing -he reports pancreatic cancer in his mother, prostate cancer in his father, ovarian in PGM, and lung in PGF (smoker). -he proceeded with testing on 07/30/21. 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 Left kidney stone, right flank pain -f/u urology for kidney stone   5. DM, uncontrolled hyperglycemia  -he is now followed by Dr. Loanne Drilling, now on metformin and glucotrol. -BG is down to 190 today (01/03/22)     PLAN: -proceed with C9 FOLFIRINOX with same reduced dose              Ellen Henri  and 1h IVF on day 3 -lab, flush, f/u, and FOLFIRINOX in 2, 4, and 6 weeks  -restaging CT to be done before treatment in 4 weeks, contrast given today  -he prefers Thursday morning appointments   No problem-specific Assessment & Plan notes found for this encounter.   SUMMARY OF ONCOLOGIC HISTORY: Oncology History Overview Note   Cancer  Staging  Pancreatic adenocarcinoma [facility]) Staging form: Exocrine Pancreas, AJCC 8th Edition - Clinical stage from 07/26/2021: Stage IV (cT4, cN0, cM1) - Signed by Malachy Mood, MD on 07/28/2021    Pancreatic adenocarcinoma (HCC)  07/02/2021 Initial Diagnosis   Pancreatic adenocarcinoma (HCC)   07/26/2021 Cancer Staging   Staging form: Exocrine Pancreas, AJCC 8th Edition - Clinical stage from 07/26/2021: Stage IV (cT4, cN0, cM1) - Signed by Malachy Mood, MD on 07/28/2021 Stage prefix: Initial diagnosis Total positive nodes: 0    08/13/2021 -  Chemotherapy   Patient is on Treatment Plan : PANCREAS Modified FOLFIRINOX q14d x 4 cycles      Genetic Testing   [facility] CancerNext-Expanded results (77 genes) were negative. No pathogenic variants were identified. 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 The report date is 08/15/2021.    The CancerNext-Expanded gene panel offered by [facility] and includes sequencing, rearrangement, and RNA analysis for the following 77 genes: AIP, ALK, APC, ATM, AXIN2, BAP1, BARD1, BLM, BMPR1A, BRCA1, BRCA2, BRIP1, CDC73, CDH1, CDK4, CDKN1B, CDKN2A, CHEK2, CTNNA1, DICER1, FANCC, FH, FLCN, GALNT12, KIF1B, LZTR1, MAX, MEN1, MET, MLH1, MSH2, MSH3, MSH6, MUTYH, NBN, NF1, NF2, NTHL1, PALB2, PHOX2B, PMS2, POT1, PRKAR1A, PTCH1, PTEN, RAD51C, RAD51D, RB1, RECQL, RET, SDHA, SDHAF2, SDHB, SDHC, SDHD, SMAD4, SMARCA4, SMARCB1, SMARCE1, STK11, SUFU, TMEM127, TP53, TSC1, TSC2, VHL and XRCC2 (sequencing and deletion/duplication); EGFR, EGLN1, HOXB13, KIT, MITF, PDGFRA, POLD1, and POLE (sequencing only); EPCAM and GREM1 (deletion/duplication only).     10/25/2021 Imaging   EXAM: CT CHEST, ABDOMEN, AND PELVIS WITH CONTRAST  IMPRESSION: 1. Innumerable bilateral pulmonary nodules, many of which are cavitary, consistent with metastatic disease. These nodules show no substantial change in are minimally progressed in the interval. 2. Mix cystic and solid lesion in  the head and body of the pancreas is similar to prior and also comparing back to MRI 07/02/2021. 3. Hepatic cysts. 4. 8 mm nonobstructing left renal stone. 5. Aortic Atherosclerosis (ICD10-I70.0).      INTERVAL HISTORY:  Zachary Reid is here for a follow up of metastatic pancreatic cancer. He was last seen by me on 12/20/21. He presents to the clinic accompanied by his wife. He reports he had significant nausea with last cycle. I asked if he took the nausea medicine, he replied "That's a good question." He notes Sunday (day 4) is the worst day following infusion, for nausea and fatigue, but he is back to playing golf on Wednesday. Overall, he feels it is tolerable. He does note some depression. He notes he met with Dr. Bosie Clos but did not feel it was helpful; he describes the visits as just him talking and not receiving feedback. They note they hope to take a trip for their daughter's spring break in May.   All other systems were reviewed with the patient and are negative.  MEDICAL HISTORY:  Past Medical History:  Diagnosis Date   Abnormal EKG    DM (diabetes mellitus) (HCC)    Hyperlipidemia    Hypogonadism male    IBS (irritable bowel syndrome)    Obesity    BMI 31  pancreatic ca 06/28/2021   Vitamin D deficiency     SURGICAL HISTORY: Past Surgical History:  Procedure Laterality Date   APPENDECTOMY     BIOPSY  07/26/2021   Procedure: BIOPSY;  Surgeon: Rush Landmark Telford Nab., MD;  Location: [place];  Service: Gastroenterology;;   COLONOSCOPY WITH PROPOFOL N/A 07/26/2021   Procedure: COLONOSCOPY WITH PROPOFOL;  Surgeon: Irving Copas., MD;  Location: [place];  Service: Gastroenterology;  Laterality: N/A;   ESOPHAGOGASTRODUODENOSCOPY (EGD) WITH PROPOFOL N/A 07/26/2021   Procedure: ESOPHAGOGASTRODUODENOSCOPY (EGD) WITH PROPOFOL;  Surgeon: Rush Landmark Telford Nab., MD;  Location: [place];  Service: Gastroenterology;  Laterality: N/A;   EUS N/A 07/26/2021    Procedure: UPPER ENDOSCOPIC ULTRASOUND (EUS) RADIAL;  Surgeon: Irving Copas., MD;  Location: [place];  Service: Gastroenterology;  Laterality: N/A;   FINE NEEDLE ASPIRATION  07/26/2021   Procedure: FINE NEEDLE ASPIRATION (FNA) LINEAR;  Surgeon: Rush Landmark Telford Nab., MD;  Location: [place];  Service: Gastroenterology;;   IR IMAGING GUIDED PORT INSERTION  08/10/2021   POLYPECTOMY  07/26/2021   Procedure: POLYPECTOMY;  Surgeon: Rush Landmark Telford Nab., MD;  Location: [place];  Service: Gastroenterology;;   SHOULDER SURGERY Right     I have reviewed the social history and family history with the patient and they are unchanged from previous note.  ALLERGIES:  is allergic to lipitor [atorvastatin].  MEDICATIONS:  Current Outpatient Medications  Medication Sig Dispense Refill   ALPRAZolam (XANAX) 0.25 MG tablet Take 1-2 tablets (0.25-0.5 mg total) by mouth 2 (two) times daily as needed for anxiety. 60 tablet 0   aspirin EC 81 MG tablet Take 81 mg by mouth in the morning and at bedtime. Swallow whole.     Cholecalciferol (VITAMIN D3) 125 MCG (5000 UT) CAPS Take 5,000 Units by mouth in the morning and at bedtime.     gabapentin (NEURONTIN) 600 MG tablet Take 1 tablet (600 mg total) by mouth 3 (three) times daily. 90 tablet 1   glipiZIDE (GLUCOTROL) 5 MG tablet Take 1 tablet 2 to 3 x /day with Meals for Diabetes 270 tablet 0   glucose blood test strip Use as instructed 100 each 12   hydrocortisone (ANUSOL-HC) 25 MG suppository Place 1 suppository (25 mg total) rectally at bedtime. QHS x 1-week and then Every Other night until prescription complete 12 suppository 0   hyoscyamine (LEVSIN SL) 0.125 MG SL tablet DISSOLVE ONE TABLET UNDER THE TONGUE 4 TIMES DAILY UP TO EVERY 4 HOURS AS NEEDED FOR NAUSEA, BLOATING, CRAMPING, OR DIARRHEA 100 tablet 0   insulin glargine (LANTUS SOLOSTAR) 100 UNIT/ML Solostar Pen Inject 6 Units into the skin every morning. And pen needles 1/day 15 mL PRN    lidocaine-prilocaine (EMLA) cream Apply to affected area once 30 g 3   metFORMIN (GLUCOPHAGE-XR) 500 MG 24 hr tablet TAKE 2 TABLETS TWICE A DAY WITH MEALS FOR DIABETES 360 tablet 3   ondansetron (ZOFRAN ODT) 4 MG disintegrating tablet Take 1 tablet (4 mg total) by mouth every 8 (eight) hours as needed for nausea or vomiting. 20 tablet 0   ondansetron (ZOFRAN) 8 MG tablet Take 1 tablet (8 mg total) by mouth 2 (two) times daily as needed. Start on day 3 after chemotherapy. 30 tablet 1   oxyCODONE (ROXICODONE) 5 MG immediate release tablet Take 1 tablet (5 mg total) by mouth every 6 (six) hours as needed for up to 6 doses for severe pain. 30 tablet 0   prochlorperazine (COMPAZINE) 10 MG tablet Take 1 tablet (  10 mg total) by mouth every 6 (six) hours as needed (Nausea or vomiting). 30 tablet 1   traMADol (ULTRAM) 50 MG tablet Take 1 tablet (50 mg total) by mouth every 6 (six) hours as needed. 90 tablet 0   zolpidem (AMBIEN) 10 MG tablet Take 0.5-1 tablets (5-10 mg total) by mouth at bedtime as needed for sleep. 30 tablet 0   zolpidem (AMBIEN) 10 MG tablet Take 1 tablet (10 mg total) by mouth at bedtime as needed for sleep. 30 tablet 0   No current facility-administered medications for this visit.   Facility-Administered Medications Ordered in Other Visits  Medication Dose Route Frequency Provider Last Rate Last Admin   atropine injection 0.5 mg  0.5 mg Intravenous Once PRN [name], MD       fluorouracil (ADRUCIL) 3,800 mg in sodium chloride 0.9 % 74 mL chemo infusion  2,000 mg/m2 (Treatment Plan Recorded) Intravenous 1 day or 1 dose [name], MD       irinotecan (CAMPTOSAR) 280 mg in sodium chloride 0.9 % 500 mL chemo infusion  150 mg/m2 (Treatment Plan Recorded) Intravenous Once [name], MD       leucovorin 764 mg in sodium chloride 0.9 % 250 mL infusion  400 mg/m2 (Treatment Plan Recorded) Intravenous Once [name], MD       oxaliplatin (ELOXATIN) 135 mg in dextrose 5 % 500 mL chemo infusion   70 mg/m2 (Treatment Plan Recorded) Intravenous Once [name], MD        PHYSICAL EXAMINATION: ECOG PERFORMANCE STATUS: 1 - Symptomatic but completely ambulatory  Vitals:   01/03/22 0915  BP: 129/78  Pulse: 81  Resp: 17  Temp: 98.3 F (36.8 C)  SpO2: 99%   Wt Readings from Last 3 Encounters:  01/03/22 161 lb 1.6 oz (73.1 kg)  12/27/21 152 lb (68.9 kg)  12/20/21 160 lb 6.4 oz (72.8 kg)     GENERAL:alert, no distress and comfortable SKIN: skin color normal, no rashes or significant lesions EYES: normal, Conjunctiva are pink and non-injected, sclera clear  NEURO: alert & oriented x 3 with fluent speech  LABORATORY DATA:  I have reviewed the data as listed CBC Latest Ref Rng & Units 01/03/2022 12/20/2021 12/06/2021  WBC 4.0 - 10.5 K/uL 9.8 12.3(H) 9.4  Hemoglobin 13.0 - 17.0 g/dL 12.1(L) 12.8(L) 13.3  Hematocrit 39.0 - 52.0 % 36.0(L) 36.3(L) 37.5(L)  Platelets 150 - 400 K/uL 104(L) 120(L) 88(L)     CMP Latest Ref Rng & Units 01/03/2022 12/20/2021 12/06/2021  Glucose 70 - 99 mg/dL 190(H) 269(H) 313(H)  BUN 8 - 23 mg/dL $Remove'13 10 14  'mtgmAgA$ Creatinine 0.61 - 1.24 mg/dL 0.74 0.84 0.84  Sodium 135 - 145 mmol/L 142 140 137  Potassium 3.5 - 5.1 mmol/L 3.5 3.9 3.9  Chloride 98 - 111 mmol/L 109 106 103  CO2 22 - 32 mmol/L $RemoveB'26 27 26  'kZXIIpsg$ Calcium 8.9 - 10.3 mg/dL 9.2 9.4 9.5  Total Protein 6.5 - 8.1 g/dL 6.4(L) 6.6 6.9  Total Bilirubin 0.3 - 1.2 mg/dL 0.4 0.4 0.5  Alkaline Phos 38 - 126 U/L 135(H) 158(H) 141(H)  AST 15 - 41 U/L $Remo'21 19 17  'kRtBe$ ALT 0 - 44 U/L $Remo'24 24 21      'GLlot$ RADIOGRAPHIC STUDIES: I have personally reviewed the radiological images as listed and agreed with the findings in the report. No results found.    Orders Placed This Encounter  Procedures   CT CHEST ABDOMEN PELVIS W CONTRAST    Standing Status:  Future    Standing Expiration Date:   01/03/2023    Order Specific Question:   Preferred imaging location?    Answer:   [facility]    Order Specific Question:   Release to  patient    Answer:   Immediate    Order Specific Question:   Is Oral Contrast requested for this exam?    Answer:   Yes, Per Radiology protocol   All questions were answered. The patient knows to call the clinic with any problems, questions or concerns. No barriers to learning was detected. The total time spent in the appointment was 30 minutes.     [name], MD 01/03/2022   I, Wilburn Mylar, am acting as scribe for [name], MD.   I have reviewed the above documentation for accuracy and completeness, and I agree with the above.

## 2022-01-03 NOTE — Patient Instructions (Signed)
[facility]   Discharge Instructions: Thank you for choosing [facility] to provide your oncology and hematology care.   If you have a lab appointment with the [facility], please go directly to the [facility] and check in at the registration area.   Wear comfortable clothing and clothing appropriate for easy access to any Portacath or PICC line.   We strive to give you quality time with your provider. You may need to reschedule your appointment if you arrive late (15 or more minutes).  Arriving late affects you and other patients whose appointments are after yours.  Also, if you miss three or more appointments without notifying the office, you may be dismissed from the clinic at the providers discretion.      For prescription refill requests, have your pharmacy contact our office and allow 72 hours for refills to be completed.    Today you received the following chemotherapy and/or immunotherapy agents: oxaliplatin, irinotecan, leucovorin, and fluorouracil.      To help prevent nausea and vomiting after your treatment, we encourage you to take your nausea medication as directed.  BELOW ARE SYMPTOMS THAT SHOULD BE REPORTED IMMEDIATELY: *FEVER GREATER THAN 100.4 F (38 C) OR HIGHER *CHILLS OR SWEATING *NAUSEA AND VOMITING THAT IS NOT CONTROLLED WITH YOUR NAUSEA MEDICATION *UNUSUAL SHORTNESS OF BREATH *UNUSUAL BRUISING OR BLEEDING *URINARY PROBLEMS (pain or burning when urinating, or frequent urination) *BOWEL PROBLEMS (unusual diarrhea, constipation, pain near the anus) TENDERNESS IN MOUTH AND THROAT WITH OR WITHOUT PRESENCE OF ULCERS (sore throat, sores in mouth, or a toothache) UNUSUAL RASH, SWELLING OR PAIN  UNUSUAL VAGINAL DISCHARGE OR ITCHING   Items with * indicate a potential emergency and should be followed up as soon as possible or go to the Emergency Department if any problems should occur.  Please show the CHEMOTHERAPY ALERT  CARD or IMMUNOTHERAPY ALERT CARD at check-in to the Emergency Department and triage nurse.  Should you have questions after your visit or need to cancel or reschedule your appointment, please contact [facility]  Dept: [phone]  and follow the prompts.  Office hours are 8:00 a.m. to 4:30 p.m. Monday - Friday. Please note that voicemails left after 4:00 p.m. may not be returned until the following business day.  We are closed weekends and major holidays. You have access to a nurse at all times for urgent questions. Please call the main number to the clinic Dept: [phone] and follow the prompts.   For any non-urgent questions, you may also contact your provider using MyChart. We now offer e-Visits for anyone 50 and older to request care online for non-urgent symptoms. For details visit [email or web address].   Also download the MyChart app! Go to the app store, search "MyChart", open the app, select [facility], and log in with your MyChart username and password.  Due to Covid, a mask is required upon entering the hospital/clinic. If you do not have a mask, one will be given to you upon arrival. For doctor visits, patients may have 1 support person aged 30 or older with them. For treatment visits, patients cannot have anyone with them due to current Covid guidelines and our immunocompromised population.

## 2022-01-03 NOTE — Progress Notes (Signed)
Per Dr.Feng, ok to speed up pump to 44hrs. Will double check with 2nd RN

## 2022-01-04 ENCOUNTER — Encounter: Payer: Self-pay | Admitting: Hematology

## 2022-01-04 ENCOUNTER — Other Ambulatory Visit: Payer: Self-pay

## 2022-01-04 DIAGNOSIS — E860 Dehydration: Secondary | ICD-10-CM | POA: Insufficient documentation

## 2022-01-04 LAB — CANCER ANTIGEN 19-9: CA 19-9: 112 U/mL — ABNORMAL HIGH (ref 0–35)

## 2022-01-04 NOTE — Progress Notes (Signed)
Hydration Supportive Therapy Plan entered.

## 2022-01-05 ENCOUNTER — Other Ambulatory Visit: Payer: Self-pay

## 2022-01-05 ENCOUNTER — Inpatient Hospital Stay: Payer: Medicare Other

## 2022-01-05 VITALS — BP 131/82 | HR 86 | Temp 97.5°F | Resp 16

## 2022-01-05 DIAGNOSIS — C259 Malignant neoplasm of pancreas, unspecified: Secondary | ICD-10-CM

## 2022-01-05 DIAGNOSIS — Z5111 Encounter for antineoplastic chemotherapy: Secondary | ICD-10-CM | POA: Diagnosis not present

## 2022-01-05 MED ORDER — HEPARIN SOD (PORK) LOCK FLUSH 100 UNIT/ML IV SOLN
500.0000 [IU] | Freq: Once | INTRAVENOUS | Status: AC | PRN
  Administered 2022-01-05: 500 [IU]

## 2022-01-05 MED ORDER — SODIUM CHLORIDE 0.9% FLUSH
10.0000 mL | INTRAVENOUS | Status: DC | PRN
  Administered 2022-01-05: 10 mL

## 2022-01-05 MED ORDER — PEGFILGRASTIM-CBQV 6 MG/0.6ML ~~LOC~~ SOSY
6.0000 mg | PREFILLED_SYRINGE | Freq: Once | SUBCUTANEOUS | Status: AC
  Administered 2022-01-05: 6 mg via SUBCUTANEOUS
  Filled 2022-01-05: qty 0.6

## 2022-01-05 MED ORDER — SODIUM CHLORIDE 0.9 % IV SOLN: INTRAVENOUS | Status: AC

## 2022-01-05 NOTE — Patient Instructions (Signed)

## 2022-01-08 ENCOUNTER — Telehealth: Payer: Self-pay | Admitting: Hematology

## 2022-01-08 NOTE — Telephone Encounter (Signed)
Scheduled follow-up appointments per 2/16 los. Patient is aware. Patient cancelled infusions on 03/02 and 03/04 due to going out of town. Informed MD.

## 2022-01-09 ENCOUNTER — Other Ambulatory Visit: Payer: Self-pay | Admitting: Hematology

## 2022-01-09 MED ORDER — TRAMADOL HCL 50 MG PO TABS: 50.0000 mg | ORAL_TABLET | Freq: Four times a day (QID) | ORAL | 0 refills | Status: DC | PRN

## 2022-01-09 MED ORDER — ALPRAZOLAM 0.25 MG PO TABS: 0.2500 mg | ORAL_TABLET | Freq: Two times a day (BID) | ORAL | 0 refills | Status: DC | PRN

## 2022-01-11 ENCOUNTER — Encounter: Payer: Self-pay | Admitting: Hematology

## 2022-01-16 ENCOUNTER — Encounter: Payer: Self-pay | Admitting: Internal Medicine

## 2022-01-17 ENCOUNTER — Ambulatory Visit: Payer: Medicare Other | Admitting: Hematology

## 2022-01-17 ENCOUNTER — Other Ambulatory Visit: Payer: Medicare Other

## 2022-01-17 ENCOUNTER — Ambulatory Visit: Payer: Medicare Other

## 2022-01-19 ENCOUNTER — Ambulatory Visit: Payer: Medicare Other

## 2022-01-21 ENCOUNTER — Other Ambulatory Visit: Payer: Self-pay | Admitting: Hematology

## 2022-01-21 ENCOUNTER — Encounter: Payer: Self-pay | Admitting: Hematology

## 2022-01-21 ENCOUNTER — Other Ambulatory Visit: Payer: Self-pay

## 2022-01-21 ENCOUNTER — Ambulatory Visit (INDEPENDENT_AMBULATORY_CARE_PROVIDER_SITE_OTHER): Payer: Medicare Other | Admitting: Endocrinology

## 2022-01-21 VITALS — BP 110/70 | HR 82 | Ht 71.0 in | Wt 159.4 lb

## 2022-01-21 DIAGNOSIS — E1165 Type 2 diabetes mellitus with hyperglycemia: Secondary | ICD-10-CM

## 2022-01-21 DIAGNOSIS — Z794 Long term (current) use of insulin: Secondary | ICD-10-CM

## 2022-01-21 DIAGNOSIS — E1122 Type 2 diabetes mellitus with diabetic chronic kidney disease: Secondary | ICD-10-CM

## 2022-01-21 MED ORDER — ZOLPIDEM TARTRATE 10 MG PO TABS: 10.0000 mg | ORAL_TABLET | Freq: Every evening | ORAL | 0 refills | Status: DC | PRN

## 2022-01-21 MED ORDER — LANTUS SOLOSTAR 100 UNIT/ML ~~LOC~~ SOPN: 15.0000 [IU] | PEN_INJECTOR | SUBCUTANEOUS | 99 refills | Status: DC

## 2022-01-21 MED ORDER — METFORMIN HCL ER 500 MG PO TB24: 1000.0000 mg | ORAL_TABLET | Freq: Every day | ORAL | 3 refills | Status: DC

## 2022-01-21 MED ORDER — GLIPIZIDE 5 MG PO TABS: 5.0000 mg | ORAL_TABLET | Freq: Every day | ORAL | 3 refills | Status: DC

## 2022-01-21 NOTE — Patient Instructions (Addendum)
check your blood sugar twice a day.  vary the time of day when you check, between before the 3 meals, and at bedtime.  also check if you have symptoms of your blood sugar being too high or too low.  please keep a record of the readings and bring it to your next appointment here (or you can bring the meter itself).  You can write it on any piece of paper.  please call us sooner if your blood sugar goes below 70, or if most of your readings are over 200.   ?We will need to take this complex situation in stages.   ?Please increase the Lantus to 15 units each morning, and reduce the glipizide and metformin, as below.   ?Out goals are to phase out the 2 diabetes pills, and to have the blood sugar in the low-100's at all times of day.   ?Please come back for a follow-up appointment in 1 month.   ?

## 2022-01-21 NOTE — Progress Notes (Signed)
? ?Subjective:  ? ? Patient ID: Zachary Reid, male    DOB: 05-14-1956, 66 y.o.   MRN: [id number] ? ?HPI ?Pt returns for f/u of diabetes mellitus: ?DM type: Insulin-requiring type 2 ?Dx'ed: 2013 ?Complications: PAD ?Therapy: insulin since 2023 ?DKA: never ?Severe hypoglycemia: never ?SDOH: cancer is unresectable ?Other: He takes chemotx for panc cancer. ?Interval history: He takes 12 units qam.  Pt says cbg's vary from 117-280.  It is in general higher as the day goes on.  Ins declined Levemir.   ?Past Medical History:  ?Diagnosis Date  ? Abnormal EKG   ? DM (diabetes mellitus) ([place])   ? Hyperlipidemia   ? Hypogonadism male   ? IBS (irritable bowel syndrome)   ? Obesity   ? BMI 31  ? pancreatic ca 06/28/2021  ? Vitamin D deficiency   ? ? ?Past Surgical History:  ?Procedure Laterality Date  ? APPENDECTOMY    ? BIOPSY  07/26/2021  ? Procedure: BIOPSY;  Surgeon: Irving Copas., MD;  Location: [facility];  Service: Gastroenterology;;  ? COLONOSCOPY WITH PROPOFOL N/A 07/26/2021  ? Procedure: COLONOSCOPY WITH PROPOFOL;  Surgeon: Mansouraty, Telford Nab., MD;  Location: [facility];  Service: Gastroenterology;  Laterality: N/A;  ? ESOPHAGOGASTRODUODENOSCOPY (EGD) WITH PROPOFOL N/A 07/26/2021  ? Procedure: ESOPHAGOGASTRODUODENOSCOPY (EGD) WITH PROPOFOL;  Surgeon: Rush Landmark Telford Nab., MD;  Location: [facility];  Service: Gastroenterology;  Laterality: N/A;  ? EUS N/A 07/26/2021  ? Procedure: UPPER ENDOSCOPIC ULTRASOUND (EUS) RADIAL;  Surgeon: Rush Landmark Telford Nab., MD;  Location: [facility];  Service: Gastroenterology;  Laterality: N/A;  ? FINE NEEDLE ASPIRATION  07/26/2021  ? Procedure: FINE NEEDLE ASPIRATION (FNA) LINEAR;  Surgeon: Irving Copas., MD;  Location: [facility];  Service: Gastroenterology;;  ? IR IMAGING GUIDED PORT INSERTION  08/10/2021  ? POLYPECTOMY  07/26/2021  ? Procedure: POLYPECTOMY;  Surgeon: Mansouraty, Telford Nab., MD;  Location: [facility];  Service: Gastroenterology;;  ?  SHOULDER SURGERY Right   ? ? ?Social History  ? ?Socioeconomic History  ? Marital status: Married  ?  Spouse name: Not on file  ? Number of children: 1  ? Years of education: Not on file  ? Highest education level: Not on file  ?Occupational History  ? Not on file  ?Tobacco Use  ? Smoking status: Never  ? Smokeless tobacco: Never  ?Substance and Sexual Activity  ? Alcohol use: No  ? Drug use: No  ? Sexual activity: Never  ?Other Topics Concern  ? Not on file  ?Social History Narrative  ? Not on file  ? ?Social Determinants of Health  ? ?Financial Resource Strain: Not on file  ?Food Insecurity: Not on file  ?Transportation Needs: Not on file  ?Physical Activity: Not on file  ?Stress: Not on file  ?Social Connections: Not on file  ?Intimate Partner Violence: Not on file  ? ? ?Current Outpatient Medications on File Prior to Visit  ?Medication Sig Dispense Refill  ? ALPRAZolam (XANAX) 0.25 MG tablet Take 1-2 tablets (0.25-0.5 mg total) by mouth 2 (two) times daily as needed for anxiety. 60 tablet 0  ? aspirin EC 81 MG tablet Take 81 mg by mouth in the morning and at bedtime. Swallow whole.    ? Cholecalciferol (VITAMIN D3) 125 MCG (5000 UT) CAPS Take 5,000 Units by mouth in the morning and at bedtime.    ? gabapentin (NEURONTIN) 600 MG tablet Take 1 tablet (600 mg total) by mouth 3 (three) times daily. 90 tablet 1  ? glucose  blood test strip Use as instructed 100 each 12  ? hydrocortisone (ANUSOL-HC) 25 MG suppository Place 1 suppository (25 mg total) rectally at bedtime. QHS x 1-week and then Every Other night until prescription complete 12 suppository 0  ? hyoscyamine (LEVSIN SL) 0.125 MG SL tablet DISSOLVE ONE TABLET UNDER THE TONGUE 4 TIMES DAILY UP TO EVERY 4 HOURS AS NEEDED FOR NAUSEA, BLOATING, CRAMPING, OR DIARRHEA 100 tablet 0  ? lidocaine-prilocaine (EMLA) cream Apply to affected area once 30 g 3  ? ondansetron (ZOFRAN ODT) 4 MG disintegrating tablet Take 1 tablet (4 mg total) by mouth every 8 (eight) hours as  needed for nausea or vomiting. 20 tablet 0  ? ondansetron (ZOFRAN) 8 MG tablet Take 1 tablet (8 mg total) by mouth 2 (two) times daily as needed. Start on day 3 after chemotherapy. 30 tablet 1  ? oxyCODONE (ROXICODONE) 5 MG immediate release tablet Take 1 tablet (5 mg total) by mouth every 6 (six) hours as needed for up to 6 doses for severe pain. 30 tablet 0  ? prochlorperazine (COMPAZINE) 10 MG tablet Take 1 tablet (10 mg total) by mouth every 6 (six) hours as needed (Nausea or vomiting). 30 tablet 1  ? traMADol (ULTRAM) 50 MG tablet Take 1 tablet (50 mg total) by mouth every 6 (six) hours as needed. 90 tablet 0  ? zolpidem (AMBIEN) 10 MG tablet Take 0.5-1 tablets (5-10 mg total) by mouth at bedtime as needed for sleep. 30 tablet 0  ? zolpidem (AMBIEN) 10 MG tablet Take 1 tablet (10 mg total) by mouth at bedtime as needed for sleep. 30 tablet 0  ? ?No current facility-administered medications on file prior to visit.  ? ? ?Allergies  ?Allergen Reactions  ? Lipitor [Atorvastatin]   ?  fatigue  ? ? ?Family History  ?Problem Relation Age of Onset  ? Pancreatic cancer Mother 39  ? Prostate cancer Father 23  ?     Prostate  ? Diabetes Father   ? Hypertension Father   ? Drug abuse Brother   ? Kidney Stones Brother   ? Ovarian cancer Paternal Grandmother 24  ? Lung cancer Paternal Grandfather   ?     Cigar smoker  ? Colon cancer Neg Hx   ? Colon polyps Neg Hx   ? Esophageal cancer Neg Hx   ? Stomach cancer Neg Hx   ? Rectal cancer Neg Hx   ? ? ?BP 110/70   Pulse 82   Ht '5\' 11"'$  (1.803 m)   Wt 159 lb 6.4 oz (72.3 kg)   SpO2 97%   BMI 22.23 kg/m?  ? ? ?Review of Systems ?He has regained 7 lbs since last ov here.   ?   ?Objective:  ? Physical Exam ? ? ? ?   ?Assessment & Plan:  ?Insulin-requiring type 2 DM: uncontrolled ? ?Patient Instructions  ?check your blood sugar twice a day.  vary the time of day when you check, between before the 3 meals, and at bedtime.  also check if you have symptoms of your blood sugar being  too high or too low.  please keep a record of the readings and bring it to your next appointment here (or you can bring the meter itself).  You can write it on any piece of paper.  please call us sooner if your blood sugar goes below 70, or if most of your readings are over 200.   ?We will need to take this complex situation  in stages.   ?Please increase the Lantus to 15 units each morning, and reduce the glipizide and metformin, as below.   ?Out goals are to phase out the 2 diabetes pills, and to have the blood sugar in the low-100's at all times of day.   ?Please come back for a follow-up appointment in 1 month.   ? ? ?

## 2022-01-28 ENCOUNTER — Ambulatory Visit (HOSPITAL_COMMUNITY)
Admission: RE | Admit: 2022-01-28 | Discharge: 2022-01-28 | Disposition: A | Discharge status: Home / Self Care | Payer: Medicare Other | Source: Ambulatory Visit | Attending: Hematology | Admitting: Hematology

## 2022-01-28 ENCOUNTER — Encounter (HOSPITAL_COMMUNITY): Payer: Self-pay

## 2022-01-28 ENCOUNTER — Other Ambulatory Visit: Payer: Self-pay

## 2022-01-28 DIAGNOSIS — C259 Malignant neoplasm of pancreas, unspecified: Secondary | ICD-10-CM | POA: Diagnosis present

## 2022-01-28 MED ORDER — SODIUM CHLORIDE (PF) 0.9 % IJ SOLN
INTRAMUSCULAR | Status: AC
  Filled 2022-01-28: qty 50

## 2022-01-28 MED ORDER — IOHEXOL 300 MG/ML  SOLN
100.0000 mL | Freq: Once | INTRAMUSCULAR | Status: AC | PRN
  Administered 2022-01-28: 100 mL via INTRAVENOUS

## 2022-01-31 ENCOUNTER — Other Ambulatory Visit: Payer: Self-pay

## 2022-01-31 ENCOUNTER — Encounter: Payer: Self-pay | Admitting: Hematology

## 2022-01-31 ENCOUNTER — Inpatient Hospital Stay: Payer: Medicare Other | Attending: Hematology

## 2022-01-31 ENCOUNTER — Inpatient Hospital Stay (HOSPITAL_BASED_OUTPATIENT_CLINIC_OR_DEPARTMENT_OTHER): Payer: Medicare Other | Admitting: Hematology

## 2022-01-31 ENCOUNTER — Inpatient Hospital Stay: Payer: Medicare Other

## 2022-01-31 VITALS — BP 124/70 | HR 83 | Temp 98.3°F | Resp 17 | Ht 71.0 in | Wt 169.5 lb

## 2022-01-31 DIAGNOSIS — Z8042 Family history of malignant neoplasm of prostate: Secondary | ICD-10-CM | POA: Insufficient documentation

## 2022-01-31 DIAGNOSIS — Z801 Family history of malignant neoplasm of trachea, bronchus and lung: Secondary | ICD-10-CM | POA: Insufficient documentation

## 2022-01-31 DIAGNOSIS — R0981 Nasal congestion: Secondary | ICD-10-CM | POA: Insufficient documentation

## 2022-01-31 DIAGNOSIS — Z800 Family history of malignant neoplasm of digestive organs: Secondary | ICD-10-CM | POA: Insufficient documentation

## 2022-01-31 DIAGNOSIS — E119 Type 2 diabetes mellitus without complications: Secondary | ICD-10-CM | POA: Diagnosis not present

## 2022-01-31 DIAGNOSIS — G4700 Insomnia, unspecified: Secondary | ICD-10-CM | POA: Insufficient documentation

## 2022-01-31 DIAGNOSIS — Z87442 Personal history of urinary calculi: Secondary | ICD-10-CM | POA: Diagnosis not present

## 2022-01-31 DIAGNOSIS — C259 Malignant neoplasm of pancreas, unspecified: Secondary | ICD-10-CM

## 2022-01-31 DIAGNOSIS — Z79899 Other long term (current) drug therapy: Secondary | ICD-10-CM | POA: Diagnosis not present

## 2022-01-31 DIAGNOSIS — G629 Polyneuropathy, unspecified: Secondary | ICD-10-CM | POA: Diagnosis not present

## 2022-01-31 DIAGNOSIS — Z5189 Encounter for other specified aftercare: Secondary | ICD-10-CM | POA: Insufficient documentation

## 2022-01-31 DIAGNOSIS — Z5111 Encounter for antineoplastic chemotherapy: Secondary | ICD-10-CM | POA: Diagnosis not present

## 2022-01-31 DIAGNOSIS — Z8041 Family history of malignant neoplasm of ovary: Secondary | ICD-10-CM | POA: Diagnosis not present

## 2022-01-31 DIAGNOSIS — F419 Anxiety disorder, unspecified: Secondary | ICD-10-CM | POA: Insufficient documentation

## 2022-01-31 DIAGNOSIS — R109 Unspecified abdominal pain: Secondary | ICD-10-CM | POA: Insufficient documentation

## 2022-01-31 LAB — CBC WITH DIFFERENTIAL (CANCER CENTER ONLY)
Abs Immature Granulocytes: 0.02 10*3/uL (ref 0.00–0.07)
Basophils Absolute: 0 10*3/uL (ref 0.0–0.1)
Basophils Relative: 1 %
Eosinophils Absolute: 0.3 10*3/uL (ref 0.0–0.5)
Eosinophils Relative: 6 %
HCT: 34.6 % — ABNORMAL LOW (ref 39.0–52.0)
Hemoglobin: 11.7 g/dL — ABNORMAL LOW (ref 13.0–17.0)
Immature Granulocytes: 0 %
Lymphocytes Relative: 22 %
Lymphs Abs: 1.1 10*3/uL (ref 0.7–4.0)
MCH: 32.9 pg (ref 26.0–34.0)
MCHC: 33.8 g/dL (ref 30.0–36.0)
MCV: 97.2 fL (ref 80.0–100.0)
Monocytes Absolute: 0.6 10*3/uL (ref 0.1–1.0)
Monocytes Relative: 12 %
Neutro Abs: 2.9 10*3/uL (ref 1.7–7.7)
Neutrophils Relative %: 59 %
Platelet Count: 102 10*3/uL — ABNORMAL LOW (ref 150–400)
RBC: 3.56 MIL/uL — ABNORMAL LOW (ref 4.22–5.81)
RDW: 13.6 % (ref 11.5–15.5)
WBC Count: 4.9 10*3/uL (ref 4.0–10.5)
nRBC: 0 % (ref 0.0–0.2)

## 2022-01-31 LAB — CMP (CANCER CENTER ONLY)
ALT: 25 U/L (ref 0–44)
AST: 23 U/L (ref 15–41)
Albumin: 3.8 g/dL (ref 3.5–5.0)
Alkaline Phosphatase: 102 U/L (ref 38–126)
Anion gap: 6 (ref 5–15)
BUN: 14 mg/dL (ref 8–23)
CO2: 27 mmol/L (ref 22–32)
Calcium: 9.3 mg/dL (ref 8.9–10.3)
Chloride: 107 mmol/L (ref 98–111)
Creatinine: 0.75 mg/dL (ref 0.61–1.24)
GFR, Estimated: >60 mL/min (ref >60)
Glucose, Bld: 167 mg/dL — ABNORMAL HIGH (ref 70–99)
Potassium: 3.6 mmol/L (ref 3.5–5.1)
Sodium: 140 mmol/L (ref 135–145)
Total Bilirubin: 0.5 mg/dL (ref 0.3–1.2)
Total Protein: 6.7 g/dL (ref 6.5–8.1)

## 2022-01-31 MED ORDER — DEXTROSE 5 % IV SOLN: Freq: Once | INTRAVENOUS | Status: AC

## 2022-01-31 MED ORDER — DEXTROSE 5 % IV SOLN: Freq: Once | INTRAVENOUS | Status: DC

## 2022-01-31 MED ORDER — SODIUM CHLORIDE 0.9 % IV SOLN
150.0000 mg | Freq: Once | INTRAVENOUS | Status: AC
  Administered 2022-01-31: 150 mg via INTRAVENOUS
  Filled 2022-01-31: qty 150

## 2022-01-31 MED ORDER — OXALIPLATIN CHEMO INJECTION 100 MG/20ML
70.0000 mg/m2 | Freq: Once | INTRAVENOUS | Status: AC
  Administered 2022-01-31: 135 mg via INTRAVENOUS
  Filled 2022-01-31: qty 27

## 2022-01-31 MED ORDER — DEXAMETHASONE SODIUM PHOSPHATE 10 MG/ML IJ SOLN
6.0000 mg | Freq: Once | INTRAMUSCULAR | Status: AC
  Administered 2022-01-31: 6 mg via INTRAVENOUS
  Filled 2022-01-31: qty 1

## 2022-01-31 MED ORDER — SODIUM CHLORIDE 0.9% FLUSH
10.0000 mL | Freq: Once | INTRAVENOUS | Status: AC
  Administered 2022-01-31: 10 mL

## 2022-01-31 MED ORDER — SODIUM CHLORIDE 0.9 % IV SOLN
2000.0000 mg/m2 | INTRAVENOUS | Status: DC
  Administered 2022-01-31: 3800 mg via INTRAVENOUS
  Filled 2022-01-31: qty 76

## 2022-01-31 MED ORDER — PALONOSETRON HCL INJECTION 0.25 MG/5ML
0.2500 mg | Freq: Once | INTRAVENOUS | Status: AC
  Administered 2022-01-31: 0.25 mg via INTRAVENOUS
  Filled 2022-01-31: qty 5

## 2022-01-31 MED ORDER — HEPARIN SOD (PORK) LOCK FLUSH 100 UNIT/ML IV SOLN: 500.0000 [IU] | Freq: Once | INTRAVENOUS | Status: DC | PRN

## 2022-01-31 MED ORDER — SODIUM CHLORIDE 0.9 % IV SOLN
150.0000 mg/m2 | Freq: Once | INTRAVENOUS | Status: AC
  Administered 2022-01-31: 280 mg via INTRAVENOUS
  Filled 2022-01-31: qty 14

## 2022-01-31 MED ORDER — PROCHLORPERAZINE MALEATE 10 MG PO TABS: 10.0000 mg | ORAL_TABLET | Freq: Four times a day (QID) | ORAL | 1 refills | Status: DC | PRN

## 2022-01-31 MED ORDER — ATROPINE SULFATE 1 MG/ML IV SOLN
0.5000 mg | Freq: Once | INTRAVENOUS | Status: AC | PRN
  Administered 2022-01-31: 0.5 mg via INTRAVENOUS
  Filled 2022-01-31: qty 1

## 2022-01-31 MED ORDER — SODIUM CHLORIDE 0.9% FLUSH: 10.0000 mL | INTRAVENOUS | Status: DC | PRN

## 2022-01-31 MED ORDER — SODIUM CHLORIDE 0.9 % IV SOLN
400.0000 mg/m2 | Freq: Once | INTRAVENOUS | Status: AC
  Administered 2022-01-31: 764 mg via INTRAVENOUS
  Filled 2022-01-31: qty 38.2

## 2022-01-31 NOTE — Progress Notes (Signed)
?Zachary Reid   ?Telephone:([phone]) [phone] [phone]   ?Clinic Follow up Note  ? ?Patient Care Team: ?[name], MD as PCP - General (Internal Medicine) ?[name], NP as PCP - Hematology/Oncology (Nurse Practitioner) ?[name], MD as Consulting Physician (Orthopedic Surgery) ?[name], MD as Consulting Physician (Cardiology) ?[name], MD as Consulting Physician (Oncology) ?Earl Gala, Deliah Goody, RN as Sales executive (Oncology) ? ?Date of Service:  01/31/2022 ? ?CHIEF COMPLAINT: f/u of metastatic pancreatic cancer ? ?CURRENT THERAPY:  ?First line FOLFIRINOX, q2 weeks, starting 08/13/21 ? ?ASSESSMENT & PLAN:  ?Zachary Reid is a 66 y.o. male with  ? ?1. Metastatic pancreatic adenocarcinoma, [id number] with numerous small cavitary lesions in bilateral lungs, MMR normal  ?-found incidentally on recent CT scan for left kidney stone.  He does have some nonspecific symptoms, including epigastric pain, chronic right flank pain, and a dry cough, as well as significant weight loss in the past several years. ?-He proceeded to colonoscopy and EUS on 07/26/21.  It demonstrated a solid-cystic mass in the genu-body of the pancreas, with endosonographic evidence of SMA abutment, T4N0. Cytology confirmed adenocarcinoma. Baseline CA 19-9 elevated to 273 on 07/03/21 ?-He began first-line mFOLFIRINOX on 08/13/21, tolerated moderately well with severe fatigue for 3-7 days, constipation, and mild pancytopenia. He is able to recover well. GCSF added with cycle 2 ?-restaging CT CAP 01/28/22 showed overall stable disease. His numerous lung nodules with cavitation are slightly less thick-walled; similar pancreatic lesion. I personally reviewed his CT images and compared to his prior images and I think he had partial response overall  ?-his CA 19-9 is overall stable now, most recently 112 on 01/03/22. Today's result is pending. ?-he cancelled his last infusion due to going to the beach. Given how  well he recovered, and overall good response, we will spread out his treatments to every 3 weeks. We also discussed future treatment options if or when we discontinue oxaliplatin or if he progresses. ?-labs reviewed, overall stable, will proceed at same reduced dose. ?  ?2. Symptom Management: congestion, abdominal pain, neuropathy, nausea ?-he reports productive congestion since starting chemo. I prescribed augmentin, but he did not take it; he feels it is improving on its own. He continues to have a runny nose, even during his chemo break. ?-he also reports intermittent abdominal pain; he uses tramadol as needed, up to twice a day. 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 Will monitor. ?  ?3. Anxiety, Insomnia ?-anxiety began 2022. He was started on lexapro on 04/19/21 and wellbutrin on 06/05/21 but came off them in August due to constipation ?-reported new anxiety attacks since diagnosis. Rx Xanax on 07/12/21, helpful. We reviewed that Xanax is to be used as needed, which he is doing. ?-he notes he still has insomnia. Ambien prescribed 07/27/21, brand is more effective than generic. I previously reviewed that he should not take ambien, tramadol, or xanax at the same time. ?-he was previously referred to Dr. Michail Sermon, did not find this helpful and does not wish to f/u. ?  ?4. Genetic Testing ?-he reports pancreatic cancer in his mother, prostate cancer in his father, ovarian in PGM, and lung in PGF (smoker). ?-he proceeded with testing on 07/30/21. 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  ?5. Left kidney stone, right flank pain ?-f/u urology for kidney stone ?  ?6. DM ?-he is now followed by Dr. Loanne Drilling, now on metformin and glucotrol. ?-BG is down to 167 today (  01/31/22) ?  ?  ?PLAN: ?-lab and scan reviewed ?-proceed with C10 FOLFIRINOX with same reduced dose  ?            -Udenyca and 1h IVF on day 3 ?-lab, flush, f/u, and FOLFIRINOX in 3 and 6 weeks ?            -he prefers Thursday morning  appointments ? ? ?No problem-specific Assessment & Plan notes found for this encounter. ? ? ?SUMMARY OF ONCOLOGIC HISTORY: ?Oncology History Overview Note  ? Cancer Staging  ?Pancreatic adenocarcinoma ([place]) ?Staging form: Exocrine Pancreas, AJCC 8th Edition ?- Clinical stage from 07/26/2021: Stage IV (cT4, cN0, cM1) - Signed by [name], MD on 07/28/2021 ? ?  ?Pancreatic adenocarcinoma ([facility] [facility])  ?07/02/2021 Initial Diagnosis  ? Pancreatic adenocarcinoma [facility]) ?  ?07/26/2021 Cancer Staging  ? Staging form: Exocrine Pancreas, AJCC 8th Edition ?- Clinical stage from 07/26/2021: Stage IV (cT4, cN0, cM1) - Signed by [name], MD on 07/28/2021 ?Stage prefix: Initial diagnosis ?Total positive nodes: 0 ? ?  ?08/13/2021 -  Chemotherapy  ? Patient is on Treatment Plan : PANCREAS Modified FOLFIRINOX q14d x 4 cycles  ?   ? Genetic Testing  ? [facility] CancerNext-Expanded results (77 genes) were negative. No pathogenic variants were identified. 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 The report date is 08/15/2021.   ? ?The CancerNext-Expanded gene panel offered by [facility] and includes sequencing, rearrangement, and RNA analysis for the following 77 genes: AIP, ALK, APC, ATM, AXIN2, BAP1, BARD1, BLM, BMPR1A, BRCA1, BRCA2, BRIP1, CDC73, CDH1, CDK4, CDKN1B, CDKN2A, CHEK2, CTNNA1, DICER1, FANCC, FH, FLCN, GALNT12, KIF1B, LZTR1, MAX, MEN1, MET, MLH1, MSH2, MSH3, MSH6, MUTYH, NBN, NF1, NF2, NTHL1, PALB2, PHOX2B, PMS2, POT1, PRKAR1A, PTCH1, PTEN, RAD51C, RAD51D, RB1, RECQL, RET, SDHA, SDHAF2, SDHB, SDHC, SDHD, SMAD4, SMARCA4, SMARCB1, SMARCE1, STK11, SUFU, TMEM127, TP53, TSC1, TSC2, VHL and XRCC2 (sequencing and deletion/duplication); EGFR, EGLN1, HOXB13, KIT, MITF, PDGFRA, POLD1, and POLE (sequencing only); EPCAM and GREM1 (deletion/duplication only).   ?  ?10/25/2021 Imaging  ? EXAM: ?CT CHEST, ABDOMEN, AND PELVIS WITH CONTRAST ? ?IMPRESSION: ?1. Innumerable bilateral pulmonary nodules, many of which  are ?cavitary, consistent with metastatic disease. These nodules show no substantial change in are minimally progressed in the interval. ?2. Mix cystic and solid lesion in the head and body of the pancreas ?is similar to prior and also comparing back to MRI 07/02/2021. ?3. Hepatic cysts. ?4. 8 mm nonobstructing left renal stone. ?5. Aortic Atherosclerosis (ICD10-I70.0). ?  ?01/28/2022 Imaging  ? EXAM: ?CT CHEST, ABDOMEN, AND PELVIS WITH CONTRAST ? ?IMPRESSION: ?1. Innumerable bilateral small solid and cavitary pulmonary nodules, some of the cavitary nodules are slightly less thick-walled when compared with prior exam. ?2. Mixed cystic and solid lesion in the head and body of the ?pancreas is similar to prior exam. ?3. Nonobstructing left renal stone. ?4.  Aortic Atherosclerosis (ICD10-I70.0). ?  ? ? ? ?INTERVAL HISTORY:  ?Zachary Reid is here for a follow up of metastatic pancreatic cancer. He was last seen by me on 01/03/22. He presents to the clinic accompanied by his wife. ?He reports he enjoyed his time off of chemo. He notes his appetite has been good, and he has gained weight. ?He reports he now has some neuropathy. He notes the symptoms are very sporadic and will resolve on their own. He reports it appears in his left hand for maybe a day at a time then resolves. ?He also reports he continues to  have a runny nose. ?  ?All other systems were reviewed with the patient and are negative. ? ?MEDICAL HISTORY:  ?Past Medical History:  ?Diagnosis Date  ? Abnormal EKG   ? DM (diabetes mellitus) ([place])   ? Hyperlipidemia   ? Hypogonadism male   ? IBS (irritable bowel syndrome)   ? Obesity   ? BMI 31  ? pancreatic ca 06/28/2021  ? Vitamin D deficiency   ? ? ?SURGICAL HISTORY: ?Past Surgical History:  ?Procedure Laterality Date  ? APPENDECTOMY    ? BIOPSY  07/26/2021  ? Procedure: BIOPSY;  Surgeon: Irving Copas., MD;  Location: [facility];  Service: Gastroenterology;;  ? COLONOSCOPY WITH PROPOFOL N/A 07/26/2021  ?  Procedure: COLONOSCOPY WITH PROPOFOL;  Surgeon: Mansouraty, Telford Nab., MD;  Location: [facility];  Service: Gastroenterology;  Laterality: N/A;  ? ESOPHAGOGASTRODUODENOSCOPY (EGD) WITH PROPOFOL N/A 07/26/2021  ? Procedure

## 2022-02-01 ENCOUNTER — Telehealth: Payer: Self-pay | Admitting: Hematology

## 2022-02-01 LAB — CANCER ANTIGEN 19-9: CA 19-9: 94 U/mL — ABNORMAL HIGH (ref 0–35)

## 2022-02-01 NOTE — Telephone Encounter (Signed)
Left message with follow-up appointments per 3/16 los. ?

## 2022-02-02 ENCOUNTER — Inpatient Hospital Stay: Payer: Medicare Other

## 2022-02-02 ENCOUNTER — Other Ambulatory Visit: Payer: Self-pay

## 2022-02-02 VITALS — BP 121/89 | HR 70 | Temp 97.9°F | Resp 18

## 2022-02-02 DIAGNOSIS — Z5111 Encounter for antineoplastic chemotherapy: Secondary | ICD-10-CM | POA: Diagnosis not present

## 2022-02-02 DIAGNOSIS — C259 Malignant neoplasm of pancreas, unspecified: Secondary | ICD-10-CM

## 2022-02-02 MED ORDER — SODIUM CHLORIDE 0.9% FLUSH
10.0000 mL | INTRAVENOUS | Status: DC | PRN
  Administered 2022-02-02: 10 mL

## 2022-02-02 MED ORDER — SODIUM CHLORIDE 0.9 % IV SOLN: INTRAVENOUS | Status: AC

## 2022-02-02 MED ORDER — PEGFILGRASTIM-CBQV 6 MG/0.6ML ~~LOC~~ SOSY
6.0000 mg | PREFILLED_SYRINGE | Freq: Once | SUBCUTANEOUS | Status: AC
  Administered 2022-02-02: 6 mg via SUBCUTANEOUS

## 2022-02-02 MED ORDER — HEPARIN SOD (PORK) LOCK FLUSH 100 UNIT/ML IV SOLN
500.0000 [IU] | Freq: Once | INTRAVENOUS | Status: AC | PRN
  Administered 2022-02-02: 500 [IU]

## 2022-02-02 NOTE — Patient Instructions (Signed)

## 2022-02-14 ENCOUNTER — Inpatient Hospital Stay: Payer: Medicare Other

## 2022-02-14 ENCOUNTER — Inpatient Hospital Stay: Payer: Medicare Other | Admitting: Hematology

## 2022-02-16 ENCOUNTER — Inpatient Hospital Stay: Payer: Medicare Other

## 2022-02-21 ENCOUNTER — Other Ambulatory Visit: Payer: Self-pay

## 2022-02-21 ENCOUNTER — Inpatient Hospital Stay: Payer: Medicare Other

## 2022-02-21 ENCOUNTER — Encounter: Payer: Self-pay | Admitting: Hematology

## 2022-02-21 ENCOUNTER — Inpatient Hospital Stay: Payer: Medicare Other | Attending: Hematology

## 2022-02-21 ENCOUNTER — Inpatient Hospital Stay (HOSPITAL_BASED_OUTPATIENT_CLINIC_OR_DEPARTMENT_OTHER): Payer: Medicare Other | Admitting: Hematology

## 2022-02-21 VITALS — BP 125/72 | HR 73 | Temp 98.1°F | Resp 18 | Ht 71.0 in | Wt 167.9 lb

## 2022-02-21 DIAGNOSIS — G629 Polyneuropathy, unspecified: Secondary | ICD-10-CM | POA: Insufficient documentation

## 2022-02-21 DIAGNOSIS — Z87442 Personal history of urinary calculi: Secondary | ICD-10-CM | POA: Diagnosis not present

## 2022-02-21 DIAGNOSIS — R109 Unspecified abdominal pain: Secondary | ICD-10-CM | POA: Insufficient documentation

## 2022-02-21 DIAGNOSIS — G4700 Insomnia, unspecified: Secondary | ICD-10-CM | POA: Diagnosis not present

## 2022-02-21 DIAGNOSIS — C7800 Secondary malignant neoplasm of unspecified lung: Secondary | ICD-10-CM | POA: Diagnosis not present

## 2022-02-21 DIAGNOSIS — F419 Anxiety disorder, unspecified: Secondary | ICD-10-CM | POA: Insufficient documentation

## 2022-02-21 DIAGNOSIS — Z5111 Encounter for antineoplastic chemotherapy: Secondary | ICD-10-CM | POA: Insufficient documentation

## 2022-02-21 DIAGNOSIS — Z8042 Family history of malignant neoplasm of prostate: Secondary | ICD-10-CM | POA: Insufficient documentation

## 2022-02-21 DIAGNOSIS — Z79899 Other long term (current) drug therapy: Secondary | ICD-10-CM | POA: Insufficient documentation

## 2022-02-21 DIAGNOSIS — K5900 Constipation, unspecified: Secondary | ICD-10-CM | POA: Insufficient documentation

## 2022-02-21 DIAGNOSIS — Z800 Family history of malignant neoplasm of digestive organs: Secondary | ICD-10-CM | POA: Insufficient documentation

## 2022-02-21 DIAGNOSIS — C257 Malignant neoplasm of other parts of pancreas: Secondary | ICD-10-CM | POA: Insufficient documentation

## 2022-02-21 DIAGNOSIS — C259 Malignant neoplasm of pancreas, unspecified: Secondary | ICD-10-CM | POA: Diagnosis not present

## 2022-02-21 DIAGNOSIS — E119 Type 2 diabetes mellitus without complications: Secondary | ICD-10-CM | POA: Insufficient documentation

## 2022-02-21 LAB — CMP (CANCER CENTER ONLY)
ALT: 24 U/L (ref 0–44)
AST: 22 U/L (ref 15–41)
Albumin: 3.8 g/dL (ref 3.5–5.0)
Alkaline Phosphatase: 110 U/L (ref 38–126)
Anion gap: 7 (ref 5–15)
BUN: 18 mg/dL (ref 8–23)
CO2: 27 mmol/L (ref 22–32)
Calcium: 8.9 mg/dL (ref 8.9–10.3)
Chloride: 105 mmol/L (ref 98–111)
Creatinine: 0.76 mg/dL (ref 0.61–1.24)
GFR, Estimated: >60 mL/min (ref >60)
Glucose, Bld: 251 mg/dL — ABNORMAL HIGH (ref 70–99)
Potassium: 3.9 mmol/L (ref 3.5–5.1)
Sodium: 139 mmol/L (ref 135–145)
Total Bilirubin: 0.7 mg/dL (ref 0.3–1.2)
Total Protein: 6.6 g/dL (ref 6.5–8.1)

## 2022-02-21 LAB — CBC WITH DIFFERENTIAL (CANCER CENTER ONLY)
Abs Immature Granulocytes: 0.03 10*3/uL (ref 0.00–0.07)
Basophils Absolute: 0 10*3/uL (ref 0.0–0.1)
Basophils Relative: 1 %
Eosinophils Absolute: 0.1 10*3/uL (ref 0.0–0.5)
Eosinophils Relative: 1 %
HCT: 34.2 % — ABNORMAL LOW (ref 39.0–52.0)
Hemoglobin: 11.7 g/dL — ABNORMAL LOW (ref 13.0–17.0)
Immature Granulocytes: 0 %
Lymphocytes Relative: 14 %
Lymphs Abs: 1 10*3/uL (ref 0.7–4.0)
MCH: 33.6 pg (ref 26.0–34.0)
MCHC: 34.2 g/dL (ref 30.0–36.0)
MCV: 98.3 fL (ref 80.0–100.0)
Monocytes Absolute: 0.6 10*3/uL (ref 0.1–1.0)
Monocytes Relative: 8 %
Neutro Abs: 5.2 10*3/uL (ref 1.7–7.7)
Neutrophils Relative %: 76 %
Platelet Count: 90 10*3/uL — ABNORMAL LOW (ref 150–400)
RBC: 3.48 MIL/uL — ABNORMAL LOW (ref 4.22–5.81)
RDW: 13.6 % (ref 11.5–15.5)
WBC Count: 6.9 10*3/uL (ref 4.0–10.5)
nRBC: 0 % (ref 0.0–0.2)

## 2022-02-21 MED ORDER — DEXAMETHASONE SODIUM PHOSPHATE 10 MG/ML IJ SOLN
6.0000 mg | Freq: Once | INTRAMUSCULAR | Status: AC
  Administered 2022-02-21: 6 mg via INTRAVENOUS
  Filled 2022-02-21: qty 1

## 2022-02-21 MED ORDER — ATROPINE SULFATE 1 MG/ML IV SOLN
0.5000 mg | Freq: Once | INTRAVENOUS | Status: AC | PRN
  Administered 2022-02-21: 0.5 mg via INTRAVENOUS
  Filled 2022-02-21: qty 1

## 2022-02-21 MED ORDER — SODIUM CHLORIDE 0.9 % IV SOLN
150.0000 mg/m2 | Freq: Once | INTRAVENOUS | Status: AC
  Administered 2022-02-21: 280 mg via INTRAVENOUS
  Filled 2022-02-21: qty 14

## 2022-02-21 MED ORDER — SODIUM CHLORIDE 0.9 % IV SOLN
2000.0000 mg/m2 | INTRAVENOUS | Status: DC
  Administered 2022-02-21: 3800 mg via INTRAVENOUS
  Filled 2022-02-21: qty 76

## 2022-02-21 MED ORDER — PALONOSETRON HCL INJECTION 0.25 MG/5ML
0.2500 mg | Freq: Once | INTRAVENOUS | Status: AC
  Administered 2022-02-21: 0.25 mg via INTRAVENOUS
  Filled 2022-02-21: qty 5

## 2022-02-21 MED ORDER — SODIUM CHLORIDE 0.9 % IV SOLN
150.0000 mg | Freq: Once | INTRAVENOUS | Status: AC
  Administered 2022-02-21: 150 mg via INTRAVENOUS
  Filled 2022-02-21: qty 150

## 2022-02-21 MED ORDER — SODIUM CHLORIDE 0.9 % IV SOLN
400.0000 mg/m2 | Freq: Once | INTRAVENOUS | Status: AC
  Administered 2022-02-21: 764 mg via INTRAVENOUS
  Filled 2022-02-21: qty 25

## 2022-02-21 MED ORDER — DEXTROSE 5 % IV SOLN: Freq: Once | INTRAVENOUS | Status: AC

## 2022-02-21 MED ORDER — TRAMADOL HCL 50 MG PO TABS: 50.0000 mg | ORAL_TABLET | Freq: Four times a day (QID) | ORAL | 0 refills | Status: DC | PRN

## 2022-02-21 MED ORDER — ALPRAZOLAM 0.25 MG PO TABS: 0.2500 mg | ORAL_TABLET | Freq: Two times a day (BID) | ORAL | 0 refills | Status: DC | PRN

## 2022-02-21 MED ORDER — OXALIPLATIN CHEMO INJECTION 100 MG/20ML
50.0000 mg/m2 | Freq: Once | INTRAVENOUS | Status: AC
  Administered 2022-02-21: 95 mg via INTRAVENOUS
  Filled 2022-02-21: qty 19

## 2022-02-21 NOTE — Patient Instructions (Signed)
Zachary Reid  Discharge Instructions: ?Thank you for choosing [facility] to provide your oncology and hematology care.  ? ?If you have a lab appointment with the [facility], please go directly to the [facility] and check in at the registration area. ?  ?Wear comfortable clothing and clothing appropriate for easy access to any Portacath or PICC line.  ? ?We strive to give you quality time with your provider. You may need to reschedule your appointment if you arrive late (15 or more minutes).  Arriving late affects you and other patients whose appointments are after yours.  Also, if you miss three or more appointments without notifying the office, you may be dismissed from the clinic at the provider?s discretion.    ?  ?For prescription refill requests, have your pharmacy contact our office and allow 72 hours for refills to be completed.   ? ?Today you received the following chemotherapy and/or immunotherapy agents: Oxaliplatin, Irinotecan, Fluorouracil     ?  ?To help prevent nausea and vomiting after your treatment, we encourage you to take your nausea medication as directed. ? ?BELOW ARE SYMPTOMS THAT SHOULD BE REPORTED IMMEDIATELY: ?*FEVER GREATER THAN 100.4 F (38 ?C) OR HIGHER ?*CHILLS OR SWEATING ?*NAUSEA AND VOMITING THAT IS NOT CONTROLLED WITH YOUR NAUSEA MEDICATION ?*UNUSUAL SHORTNESS OF BREATH ?*UNUSUAL BRUISING OR BLEEDING ?*URINARY PROBLEMS (pain or burning when urinating, or frequent urination) ?*BOWEL PROBLEMS (unusual diarrhea, constipation, pain near the anus) ?TENDERNESS IN MOUTH AND THROAT WITH OR WITHOUT PRESENCE OF ULCERS (sore throat, sores in mouth, or a toothache) ?UNUSUAL RASH, SWELLING OR PAIN  ?UNUSUAL VAGINAL DISCHARGE OR ITCHING  ? ?Items with * indicate a potential emergency and should be followed up as soon as possible or go to the Emergency Department if any problems should occur. ? ?Please show the CHEMOTHERAPY ALERT CARD or  IMMUNOTHERAPY ALERT CARD at check-in to the Emergency Department and triage nurse. ? ?Should you have questions after your visit or need to cancel or reschedule your appointment, please contact [facility]  Dept: [phone]  and follow the prompts.  Office hours are 8:00 a.m. to 4:30 p.m. Monday - Friday. Please note that voicemails left after 4:00 p.m. may not be returned until the following business day.  We are closed weekends and major holidays. You have access to a nurse at all times for urgent questions. Please call the main number to the clinic Dept: [phone] and follow the prompts. ? ? ?For any non-urgent questions, you may also contact your provider using MyChart. We now offer e-Visits for anyone 56 and older to request care online for non-urgent symptoms. For details visit [email or web address]. ?  ?Also download the MyChart app! Go to the app store, search "MyChart", open the app, select [facility], and log in with your MyChart username and password. ? ?Due to Covid, a mask is required upon entering the hospital/clinic. If you do not have a mask, one will be given to you upon arrival. For doctor visits, patients may have 1 support person aged 66 or older with them. For treatment visits, patients cannot have anyone with them due to current Covid guidelines and our immunocompromised population.  ? ?

## 2022-02-21 NOTE — Progress Notes (Signed)
?[place]   ?[phone] [phone]   ?Clinic Follow up Note  ? ?Patient Care Team: ?[name], MD as PCP - General (Internal Medicine) ?[name], NP as PCP - Hematology/Oncology (Nurse Practitioner) ?[name], MD as Consulting Physician (Orthopedic Surgery) ?[name], MD as Consulting Physician (Cardiology) ?[name], MD as Consulting Physician (Oncology) ?Earl Gala, Deliah Goody, RN as Sales executive (Oncology) ? ?Date of Service:  02/21/2022 ? ?CHIEF COMPLAINT: f/u of metastatic pancreatic cancer ? ?CURRENT THERAPY:  ?First line FOLFIRINOX, starting 08/13/21, now q3weeks ? ?ASSESSMENT & PLAN:  ?Zachary Reid is a 66 y.o. male with  ? ?1. Metastatic pancreatic adenocarcinoma, [id number] with numerous small cavitary lesions in bilateral lungs, MMR normal  ?-found incidentally on recent CT scan for left kidney stone.  Zachary Reid does have some nonspecific symptoms, including epigastric pain, chronic right flank pain, and a dry cough, as well as significant weight loss in the past several years. ?-Zachary Reid proceeded to colonoscopy and EUS on 07/26/21.  It demonstrated a solid-cystic mass in the genu-body of the pancreas, with endosonographic evidence of SMA abutment, T4N0. Cytology confirmed adenocarcinoma. Baseline CA 19-9 elevated to 273 on 07/03/21 ?-Zachary Reid began first-line mFOLFIRINOX on 08/13/21, tolerated moderately well with severe fatigue for 3-7 days, constipation, and mild pancytopenia. Zachary Reid is able to recover well. GCSF added with cycle 2 ?-restaging CT CAP 01/28/22 showed overall stable disease. His numerous lung nodules with cavitation are slightly less thick-walled; similar pancreatic lesion. I personally reviewed his CT images and compared to his prior images and I think Zachary Reid had partial response overall  ?-his CA 19-9 is overall stable now, most recently 94 on 01/31/22. Today's result is pending. ?-Zachary Reid is now receiving treatment every 3 weeks, which Zachary Reid tolerates  better. Labs reviewed, overall stable, will proceed at reduced dose, will decrease oxaliplatin dose further to 50 mg/m?, due to his neuropathy and thrombocytopenia. ?  ?2. Symptom Management: abdominal pain, neuropathy ?-Zachary Reid also reports intermittent abdominal pain; Zachary Reid uses tramadol as needed, up to twice a day. ?-Zachary Reid has very mild neuropathy from the oxaliplatin. Zachary Reid is undergoing a form of light therapy and feels it is helpful. ?  ?3. Anxiety, Insomnia ?-anxiety began 2022. Zachary Reid was started on lexapro on 04/19/21 and wellbutrin on 06/05/21 but came off them in August due to constipation ?-reported new anxiety attacks since diagnosis. Rx Xanax on 07/12/21, helpful. We reviewed that Xanax is to be used as needed, which Zachary Reid is doing. ?-Zachary Reid notes Zachary Reid still has insomnia. Ambien prescribed 07/27/21, brand is more effective than generic. I previously reviewed that Zachary Reid should not take ambien, tramadol, or xanax at the same time. ?-Zachary Reid was previously referred to Dr. Michail Sermon, did not find this helpful and does not wish to f/u. ?  ?4. Genetic Testing ?-Zachary Reid reports pancreatic cancer in his mother, prostate cancer in his father, ovarian in PGM, and lung in PGF (smoker). ?-Zachary Reid proceeded with testing on 07/30/21. 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  ?5. Left kidney stone, right flank pain ?-f/u urology for kidney stone ?  ?6. DM ?-Zachary Reid is now followed by Dr. Loanne Drilling, now on metformin and glucotrol. ? ?7.  Peripheral neuropathy, grade 1 ?-Started in March 2023, likely secondary to oxaliplatin ?-We will decrease oxaliplatin dose, continue monitor closely. ?-Zachary Reid is on low-dose gabapentin, Zachary Reid has tried light therapy which helped. ?  ?  ?PLAN: ?-proceed with C11 FOLFIRINOX with further reduced oxaliplatin dose to 50 mg/m? due to  neuropathy and thrombocytopenia ?            -Udenyca and 1h IVF on day 3 ?-I refilled his tramadol and Xanax ?-lab, flush, f/u, and FOLFIRINOX in 3 and 6 weeks ?            -Zachary Reid prefers Thursday morning appointments ? ? ?No  problem-specific Assessment & Plan notes found for this encounter. ? ? ?SUMMARY OF ONCOLOGIC HISTORY: ?Oncology History Overview Note  ? Cancer Staging  ?Pancreatic adenocarcinoma ([place]) ?Staging form: Exocrine Pancreas, AJCC 8th Edition ?- Clinical stage from 07/26/2021: Stage IV (cT4, cN0, cM1) - Signed by [name], MD on 07/28/2021 ? ?  ?Pancreatic adenocarcinoma ([place])  ?07/02/2021 Initial Diagnosis  ? Pancreatic adenocarcinoma [facility]) ?  ?07/26/2021 Cancer Staging  ? Staging form: Exocrine Pancreas, AJCC 8th Edition ?- Clinical stage from 07/26/2021: Stage IV (cT4, cN0, cM1) - Signed by [name], MD on 07/28/2021 ?Stage prefix: Initial diagnosis ?Total positive nodes: 0 ? ?  ?08/13/2021 -  Chemotherapy  ? Patient is on Treatment Plan : PANCREAS Modified FOLFIRINOX q14d x 4 cycles  ?   ? Genetic Testing  ? [facility] CancerNext-Expanded results (77 genes) were negative. No pathogenic variants were identified. 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 The report date is 08/15/2021.   ? ?The CancerNext-Expanded gene panel offered by [facility] and includes sequencing, rearrangement, and RNA analysis for the following 77 genes: AIP, ALK, APC, ATM, AXIN2, BAP1, BARD1, BLM, BMPR1A, BRCA1, BRCA2, BRIP1, CDC73, CDH1, CDK4, CDKN1B, CDKN2A, CHEK2, CTNNA1, DICER1, FANCC, FH, FLCN, GALNT12, KIF1B, LZTR1, MAX, MEN1, MET, MLH1, MSH2, MSH3, MSH6, MUTYH, NBN, NF1, NF2, NTHL1, PALB2, PHOX2B, PMS2, POT1, PRKAR1A, PTCH1, PTEN, RAD51C, RAD51D, RB1, RECQL, RET, SDHA, SDHAF2, SDHB, SDHC, SDHD, SMAD4, SMARCA4, SMARCB1, SMARCE1, STK11, SUFU, TMEM127, TP53, TSC1, TSC2, VHL and XRCC2 (sequencing and deletion/duplication); EGFR, EGLN1, HOXB13, KIT, MITF, PDGFRA, POLD1, and POLE (sequencing only); EPCAM and GREM1 (deletion/duplication only).   ?  ?10/25/2021 Imaging  ? EXAM: ?CT CHEST, ABDOMEN, AND PELVIS WITH CONTRAST ? ?IMPRESSION: ?1. Innumerable bilateral pulmonary nodules, many of which are ?cavitary, consistent  with metastatic disease. These nodules show no substantial change in are minimally progressed in the interval. ?2. Mix cystic and solid lesion in the head and body of the pancreas ?is similar to prior and also comparing back to MRI 07/02/2021. ?3. Hepatic cysts. ?4. 8 mm nonobstructing left renal stone. ?5. Aortic Atherosclerosis (ICD10-I70.0). ?  ?01/28/2022 Imaging  ? EXAM: ?CT CHEST, ABDOMEN, AND PELVIS WITH CONTRAST ? ?IMPRESSION: ?1. Innumerable bilateral small solid and cavitary pulmonary nodules, some of the cavitary nodules are slightly less thick-walled when compared with prior exam. ?2. Mixed cystic and solid lesion in the head and body of the ?pancreas is similar to prior exam. ?3. Nonobstructing left renal stone. ?4.  Aortic Atherosclerosis (ICD10-I70.0). ?  ? ? ? ?INTERVAL HISTORY:  ?Zachary Reid is here for a follow up of metastatic pancreatic cancer. Zachary Reid was last seen by me on 01/31/22. Zachary Reid presents to the clinic accompanied by his wife. ?Zachary Reid reports Zachary Reid did much better with the extra week off from chemo. Zachary Reid reports new joint pain but notes it didn't start until several days after the growth factor injection. ?Zachary Reid tells me about a light therapy Zachary Reid is doing for neuropathy. ?  ?All other systems were reviewed with the patient and are negative. ? ?MEDICAL HISTORY:  ?Past Medical History:  ?Diagnosis Date  ? Abnormal EKG   ? DM (  diabetes mellitus) ([place])   ? Hyperlipidemia   ? Hypogonadism male   ? IBS (irritable bowel syndrome)   ? Obesity   ? BMI 31  ? pancreatic ca 06/28/2021  ? Vitamin D deficiency   ? ? ?SURGICAL HISTORY: ?Past Surgical History:  ?Procedure Laterality Date  ? APPENDECTOMY    ? BIOPSY  07/26/2021  ? Procedure: BIOPSY;  Surgeon: Irving Copas., MD;  Location: [place];  Service: Gastroenterology;;  ? COLONOSCOPY WITH PROPOFOL N/A 07/26/2021  ? Procedure: COLONOSCOPY WITH PROPOFOL;  Surgeon: Mansouraty, Telford Nab., MD;  Location: [place];  Service: Gastroenterology;   Laterality: N/A;  ? ESOPHAGOGASTRODUODENOSCOPY (EGD) WITH PROPOFOL N/A 07/26/2021  ? Procedure: ESOPHAGOGASTRODUODENOSCOPY (EGD) WITH PROPOFOL;  Surgeon: Rush Landmark Telford Nab., MD;  Location: [place];  Service:

## 2022-02-22 ENCOUNTER — Inpatient Hospital Stay: Payer: Medicare Other

## 2022-02-22 LAB — CANCER ANTIGEN 19-9: CA 19-9: 86 U/mL — ABNORMAL HIGH (ref 0–35)

## 2022-02-23 ENCOUNTER — Inpatient Hospital Stay: Payer: Medicare Other

## 2022-02-23 ENCOUNTER — Other Ambulatory Visit: Payer: Self-pay

## 2022-02-23 VITALS — BP 124/74 | HR 75 | Temp 97.3°F | Resp 16

## 2022-02-23 DIAGNOSIS — C259 Malignant neoplasm of pancreas, unspecified: Secondary | ICD-10-CM

## 2022-02-23 DIAGNOSIS — Z5111 Encounter for antineoplastic chemotherapy: Secondary | ICD-10-CM | POA: Diagnosis not present

## 2022-02-23 MED ORDER — PEGFILGRASTIM-CBQV 6 MG/0.6ML ~~LOC~~ SOSY
6.0000 mg | PREFILLED_SYRINGE | Freq: Once | SUBCUTANEOUS | Status: AC
  Administered 2022-02-23: 6 mg via SUBCUTANEOUS
  Filled 2022-02-23: qty 0.6

## 2022-02-23 MED ORDER — SODIUM CHLORIDE 0.9 % IV SOLN: INTRAVENOUS | Status: AC

## 2022-02-23 MED ORDER — SODIUM CHLORIDE 0.9% FLUSH
10.0000 mL | INTRAVENOUS | Status: DC | PRN
  Administered 2022-02-23: 10 mL

## 2022-02-23 MED ORDER — HEPARIN SOD (PORK) LOCK FLUSH 100 UNIT/ML IV SOLN
500.0000 [IU] | Freq: Once | INTRAVENOUS | Status: AC | PRN
  Administered 2022-02-23: 500 [IU]

## 2022-02-23 NOTE — Patient Instructions (Signed)

## 2022-02-25 ENCOUNTER — Encounter: Payer: Self-pay | Admitting: Hematology

## 2022-02-26 ENCOUNTER — Other Ambulatory Visit: Payer: Self-pay

## 2022-02-26 ENCOUNTER — Other Ambulatory Visit: Payer: Self-pay | Admitting: Hematology

## 2022-02-26 MED ORDER — ZOLPIDEM TARTRATE 10 MG PO TABS: 10.0000 mg | ORAL_TABLET | Freq: Every evening | ORAL | 0 refills | Status: DC | PRN

## 2022-03-13 ENCOUNTER — Ambulatory Visit (INDEPENDENT_AMBULATORY_CARE_PROVIDER_SITE_OTHER): Payer: Medicare Other | Admitting: Endocrinology

## 2022-03-13 VITALS — BP 110/78 | HR 88 | Ht 71.0 in | Wt 166.2 lb

## 2022-03-13 DIAGNOSIS — E1122 Type 2 diabetes mellitus with diabetic chronic kidney disease: Secondary | ICD-10-CM | POA: Diagnosis not present

## 2022-03-13 DIAGNOSIS — Z794 Long term (current) use of insulin: Secondary | ICD-10-CM | POA: Diagnosis not present

## 2022-03-13 LAB — POCT GLYCOSYLATED HEMOGLOBIN (HGB A1C): Hemoglobin A1C: 9.6 % — AB (ref 4.0–5.6)

## 2022-03-13 MED ORDER — HUMULIN N KWIKPEN 100 UNIT/ML ~~LOC~~ SUPN: 18.0000 [IU] | PEN_INJECTOR | SUBCUTANEOUS | 1 refills | Status: DC

## 2022-03-13 NOTE — Progress Notes (Signed)
? ?Subjective:  ? ? Patient ID: Zachary Reid, male    DOB: 20-Nov-1955, 66 y.o.   MRN: [id number] ? ?HPI ?Pt returns for f/u of diabetes mellitus: ?DM type: Insulin-requiring type 2 ?Dx'ed: 2013 ?Complications: PAD ?Therapy: insulin since 2023 ?DKA: never ?Severe hypoglycemia: never ?SDOH: cancer is unresectable; Ins declined Levemir.  ?Other: He takes chemotx for panc cancer.   ?Interval history: Pt says cbg's vary from 88-555.  It is in general higher as the day goes on.  He has stopped glipizide and metformin.   ?Past Medical History:  ?Diagnosis Date  ? Abnormal EKG   ? DM (diabetes mellitus) ([place])   ? Hyperlipidemia   ? Hypogonadism male   ? IBS (irritable bowel syndrome)   ? Obesity   ? BMI 31  ? pancreatic ca 06/28/2021  ? Vitamin D deficiency   ? ? ?Past Surgical History:  ?Procedure Laterality Date  ? APPENDECTOMY    ? BIOPSY  07/26/2021  ? Procedure: BIOPSY;  Surgeon: Irving Copas., MD;  Location: [place];  Service: Gastroenterology;;  ? COLONOSCOPY WITH PROPOFOL N/A 07/26/2021  ? Procedure: COLONOSCOPY WITH PROPOFOL;  Surgeon: Mansouraty, Telford Nab., MD;  Location: [place];  Service: Gastroenterology;  Laterality: N/A;  ? ESOPHAGOGASTRODUODENOSCOPY (EGD) WITH PROPOFOL N/A 07/26/2021  ? Procedure: ESOPHAGOGASTRODUODENOSCOPY (EGD) WITH PROPOFOL;  Surgeon: Rush Landmark Telford Nab., MD;  Location: [place];  Service: Gastroenterology;  Laterality: N/A;  ? EUS N/A 07/26/2021  ? Procedure: UPPER ENDOSCOPIC ULTRASOUND (EUS) RADIAL;  Surgeon: Rush Landmark Telford Nab., MD;  Location: [place];  Service: Gastroenterology;  Laterality: N/A;  ? FINE NEEDLE ASPIRATION  07/26/2021  ? Procedure: FINE NEEDLE ASPIRATION (FNA) LINEAR;  Surgeon: Irving Copas., MD;  Location: [place];  Service: Gastroenterology;;  ? IR IMAGING GUIDED PORT INSERTION  08/10/2021  ? POLYPECTOMY  07/26/2021  ? Procedure: POLYPECTOMY;  Surgeon: Mansouraty, Telford Nab., MD;  Location: [place];  Service:  Gastroenterology;;  ? SHOULDER SURGERY Right   ? ? ?Social History  ? ?Socioeconomic History  ? Marital status: Married  ?  Spouse name: Not on file  ? Number of children: 1  ? Years of education: Not on file  ? Highest education level: Not on file  ?Occupational History  ? Not on file  ?Tobacco Use  ? Smoking status: Never  ? Smokeless tobacco: Never  ?Substance and Sexual Activity  ? Alcohol use: No  ? Drug use: No  ? Sexual activity: Never  ?Other Topics Concern  ? Not on file  ?Social History Narrative  ? Not on file  ? ?Social Determinants of Health  ? ?Financial Resource Strain: Not on file  ?Food Insecurity: Not on file  ?Transportation Needs: Not on file  ?Physical Activity: Not on file  ?Stress: Not on file  ?Social Connections: Not on file  ?Intimate Partner Violence: Not on file  ? ? ?Current Outpatient Medications on File Prior to Visit  ?Medication Sig Dispense Refill  ? ALPRAZolam (XANAX) 0.25 MG tablet Take 1-2 tablets (0.25-0.5 mg total) by mouth 2 (two) times daily as needed for anxiety. 60 tablet 0  ? aspirin EC 81 MG tablet Take 81 mg by mouth in the morning and at bedtime. Swallow whole.    ? Cholecalciferol (VITAMIN D3) 125 MCG (5000 UT) CAPS Take 5,000 Units by mouth in the morning and at bedtime.    ? gabapentin (NEURONTIN) 600 MG tablet Take 1 tablet (600 mg total) by mouth 3 (three) times daily. 90 tablet 1  ?  glucose blood test strip Use as instructed 100 each 12  ? hydrocortisone (ANUSOL-HC) 25 MG suppository Place 1 suppository (25 mg total) rectally at bedtime. QHS x 1-week and then Every Other night until prescription complete 12 suppository 0  ? hyoscyamine (LEVSIN SL) 0.125 MG SL tablet DISSOLVE ONE TABLET UNDER THE TONGUE 4 TIMES DAILY UP TO EVERY 4 HOURS AS NEEDED FOR NAUSEA, BLOATING, CRAMPING, OR DIARRHEA 100 tablet 0  ? lidocaine-prilocaine (EMLA) cream Apply to affected area once 30 g 3  ? ondansetron (ZOFRAN ODT) 4 MG disintegrating tablet Take 1 tablet (4 mg total) by mouth  every 8 (eight) hours as needed for nausea or vomiting. 20 tablet 0  ? ondansetron (ZOFRAN) 8 MG tablet Take 1 tablet (8 mg total) by mouth 2 (two) times daily as needed. Start on day 3 after chemotherapy. 30 tablet 1  ? oxyCODONE (ROXICODONE) 5 MG immediate release tablet Take 1 tablet (5 mg total) by mouth every 6 (six) hours as needed for up to 6 doses for severe pain. 30 tablet 0  ? prochlorperazine (COMPAZINE) 10 MG tablet Take 1 tablet (10 mg total) by mouth every 6 (six) hours as needed (Nausea or vomiting). 30 tablet 1  ? traMADol (ULTRAM) 50 MG tablet Take 1 tablet (50 mg total) by mouth every 6 (six) hours as needed. 90 tablet 0  ? zolpidem (AMBIEN) 10 MG tablet Take 1 tablet (10 mg total) by mouth at bedtime as needed for sleep. 30 tablet 0  ? ?No current facility-administered medications on file prior to visit.  ? ? ?Allergies  ?Allergen Reactions  ? Lipitor [Atorvastatin]   ?  fatigue  ? ? ?Family History  ?Problem Relation Age of Onset  ? Pancreatic cancer Mother 52  ? Prostate cancer Father 79  ?     Prostate  ? Diabetes Father   ? Hypertension Father   ? Drug abuse Brother   ? Kidney Stones Brother   ? Ovarian cancer Paternal Grandmother 63  ? Lung cancer Paternal Grandfather   ?     Cigar smoker  ? Colon cancer Neg Hx   ? Colon polyps Neg Hx   ? Esophageal cancer Neg Hx   ? Stomach cancer Neg Hx   ? Rectal cancer Neg Hx   ? ? ?BP 110/78 (BP Location: Left Arm, Patient Position: Sitting, Cuff Size: Normal)   Pulse 88   Ht '5\' 11"'$  (1.803 m)   Wt 166 lb 3.2 oz (75.4 kg)   SpO2 97%   BMI 23.18 kg/m?  ? ?Review of Systems ? ?   ?Objective:  ? Physical Exam ?VITAL SIGNS:  See vs page.   ?GENERAL: no distress.   ? ? ?Lab Results  ?Component Value Date  ? HGBA1C 9.6 (A) 03/13/2022  ? ?   ?Assessment & Plan:  ?Insulin-requiring type 2 DM: uncontrolled.  The pattern of his cbg's indicates he needs a faster-acting qam insulin.   ? ?Patient Instructions  ?check your blood sugar twice a day.  vary the time  of day when you check, between before the 3 meals, and at bedtime.  also check if you have symptoms of your blood sugar being too high or too low.  please keep a record of the readings and bring it to your next appointment here (or you can bring the meter itself).  You can write it on any piece of paper.  please call us sooner if your blood sugar goes below 70, or if  most of your readings are over 200.   ?We will need to take this complex situation in stages.   ?I have sent a prescription to your pharmacy, to change the Lantus to NPH, 18 units each morning.   ?On this type of insulin schedule, you should eat meals on a regular schedule.  If a meal is missed or significantly delayed, your blood sugar could go low.   ?You should have an endocrinology follow-up appointment in 2 months.   ? ? ?

## 2022-03-13 NOTE — Progress Notes (Signed)
?[facility]   ?Telephone:(336) [phone] [phone]   ?Clinic Follow up Note  ? ?Patient Care Team: ?Lucky Cowboy, MD as PCP - General (Internal Medicine) ?Pollyann Samples, NP as PCP - Hematology/Oncology (Nurse Practitioner) ?Nadara Mustard, MD as Consulting Physician (Orthopedic Surgery) ?Laurey Morale, MD as Consulting Physician (Cardiology) ?Malachy Mood, MD as Consulting Physician (Oncology) ?Franky Macho, Eugenio Hoes, RN as Agricultural consultant (Oncology) ?03/14/2022 ? ?CHIEF COMPLAINT: Follow-up metastatic pancreatic cancer ? ?SUMMARY OF ONCOLOGIC HISTORY: ?Oncology History Overview Note  ? Cancer Staging  ?Pancreatic adenocarcinoma ([facility]) ?Staging form: Exocrine Pancreas, AJCC 8th Edition ?- Clinical stage from 07/26/2021: Stage IV (cT4, cN0, cM1) - Signed by Malachy Mood, MD on 07/28/2021 ? ?  ?Pancreatic adenocarcinoma ([facility])  ?07/02/2021 Initial Diagnosis  ? Pancreatic adenocarcinoma ([facility]) ? ?  ?07/26/2021 Cancer Staging  ? Staging form: Exocrine Pancreas, AJCC 8th Edition ?- Clinical stage from 07/26/2021: Stage IV (cT4, cN0, cM1) - Signed by Malachy Mood, MD on 07/28/2021 ?Stage prefix: Initial diagnosis ?Total positive nodes: 0 ? ?  ?08/13/2021 -  Chemotherapy  ? Patient is on Treatment Plan : PANCREAS Modified FOLFIRINOX q14d x 4 cycles  ? ?  ?  ? Genetic Testing  ? [facility] CancerNext-Expanded results (77 genes) were negative. No pathogenic variants were identified. 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 The report date is 08/15/2021.   ? ?The CancerNext-Expanded gene panel offered by [facility] and includes sequencing, rearrangement, and RNA analysis for the following 77 genes: AIP, ALK, APC, ATM, AXIN2, BAP1, BARD1, BLM, BMPR1A, BRCA1, BRCA2, BRIP1, CDC73, CDH1, CDK4, CDKN1B, CDKN2A, CHEK2, CTNNA1, DICER1, FANCC, FH, FLCN, GALNT12, KIF1B, LZTR1, MAX, MEN1, MET, MLH1, MSH2, MSH3, MSH6, MUTYH, NBN, NF1, NF2, NTHL1, PALB2, PHOX2B, PMS2, POT1, PRKAR1A, PTCH1,  PTEN, RAD51C, RAD51D, RB1, RECQL, RET, SDHA, SDHAF2, SDHB, SDHC, SDHD, SMAD4, SMARCA4, SMARCB1, SMARCE1, STK11, SUFU, TMEM127, TP53, TSC1, TSC2, VHL and XRCC2 (sequencing and deletion/duplication); EGFR, EGLN1, HOXB13, KIT, MITF, PDGFRA, POLD1, and POLE (sequencing only); EPCAM and GREM1 (deletion/duplication only).   ?  ?10/25/2021 Imaging  ? EXAM: ?CT CHEST, ABDOMEN, AND PELVIS WITH CONTRAST ? ?IMPRESSION: ?1. Innumerable bilateral pulmonary nodules, many of which are ?cavitary, consistent with metastatic disease. These nodules show no substantial change in are minimally progressed in the interval. ?2. Mix cystic and solid lesion in the head and body of the pancreas ?is similar to prior and also comparing back to MRI 07/02/2021. ?3. Hepatic cysts. ?4. 8 mm nonobstructing left renal stone. ?5. Aortic Atherosclerosis (ICD10-I70.0). ?  ?01/28/2022 Imaging  ? EXAM: ?CT CHEST, ABDOMEN, AND PELVIS WITH CONTRAST ? ?IMPRESSION: ?1. Innumerable bilateral small solid and cavitary pulmonary nodules, some of the cavitary nodules are slightly less thick-walled when compared with prior exam. ?2. Mixed cystic and solid lesion in the head and body of the ?pancreas is similar to prior exam. ?3. Nonobstructing left renal stone. ?4.  Aortic Atherosclerosis (ICD10-I70.0). ?  ? ? ?CURRENT THERAPY: First line FOLFIRINOX, starting 08/13/21, oxaliplatin dose reduced and q3weeks ? ?INTERVAL HISTORY: Mr. Zachary Reid returns for follow-up and treatment as scheduled, last seen by Dr. Mosetta Putt 02/21/22 and completed another cycle of FOLFIRINOX.  Oxaliplatin was further dose reduced for neuropathy and thrombocytopenia.  Neuropathy peaked about a week and a half after infusion, he began his light therapy and neuropathy improved.  Now only bothersome but not painful.  He still has fatigue even 3 weeks later but manageable, able to function and carry on with activities.  It is hard to make longer-term plans/travel due to not knowing how tired he may be,  which is frustrating.  He otherwise tolerated the last cycle of chemo "without significant events."  Nausea remained mild, able to eat and drink, eating higher carb meals trying to gain weight.  Dr. Loanne Drilling recently adjusted his insulin.   ? ?All other systems were reviewed with the patient and are negative. ? ?MEDICAL HISTORY:  ?Past Medical History:  ?Diagnosis Date  ? Abnormal EKG   ? DM (diabetes mellitus) ([place])   ? Hyperlipidemia   ? Hypogonadism male   ? IBS (irritable bowel syndrome)   ? Obesity   ? BMI 31  ? pancreatic ca 06/28/2021  ? Vitamin D deficiency   ? ? ?SURGICAL HISTORY: ?Past Surgical History:  ?Procedure Laterality Date  ? APPENDECTOMY    ? BIOPSY  07/26/2021  ? Procedure: BIOPSY;  Surgeon: Irving Copas., MD;  Location: [place];  Service: Gastroenterology;;  ? COLONOSCOPY WITH PROPOFOL N/A 07/26/2021  ? Procedure: COLONOSCOPY WITH PROPOFOL;  Surgeon: Mansouraty, Telford Nab., MD;  Location: [place];  Service: Gastroenterology;  Laterality: N/A;  ? ESOPHAGOGASTRODUODENOSCOPY (EGD) WITH PROPOFOL N/A 07/26/2021  ? Procedure: ESOPHAGOGASTRODUODENOSCOPY (EGD) WITH PROPOFOL;  Surgeon: Rush Landmark Telford Nab., MD;  Location: [place];  Service: Gastroenterology;  Laterality: N/A;  ? EUS N/A 07/26/2021  ? Procedure: UPPER ENDOSCOPIC ULTRASOUND (EUS) RADIAL;  Surgeon: Rush Landmark Telford Nab., MD;  Location: [place];  Service: Gastroenterology;  Laterality: N/A;  ? FINE NEEDLE ASPIRATION  07/26/2021  ? Procedure: FINE NEEDLE ASPIRATION (FNA) LINEAR;  Surgeon: Irving Copas., MD;  Location: [place];  Service: Gastroenterology;;  ? IR IMAGING GUIDED PORT INSERTION  08/10/2021  ? POLYPECTOMY  07/26/2021  ? Procedure: POLYPECTOMY;  Surgeon: Mansouraty, Telford Nab., MD;  Location: [place];  Service: Gastroenterology;;  ? SHOULDER SURGERY Right   ? ? ?I have reviewed the social history and family history with the patient and they are unchanged from previous note. ? ?ALLERGIES:   is allergic to lipitor [atorvastatin]. ? ?MEDICATIONS:  ?Current Outpatient Medications  ?Medication Sig Dispense Refill  ? ALPRAZolam (XANAX) 0.25 MG tablet Take 1-2 tablets (0.25-0.5 mg total) by mouth 2 (two) times daily as needed for anxiety. 60 tablet 0  ? aspirin EC 81 MG tablet Take 81 mg by mouth in the morning and at bedtime. Swallow whole.    ? Cholecalciferol (VITAMIN D3) 125 MCG (5000 UT) CAPS Take 5,000 Units by mouth in the morning and at bedtime.    ? gabapentin (NEURONTIN) 600 MG tablet Take 1 tablet (600 mg total) by mouth 3 (three) times daily. 90 tablet 1  ? glucose blood test strip Use as instructed 100 each 12  ? hydrocortisone (ANUSOL-HC) 25 MG suppository Place 1 suppository (25 mg total) rectally at bedtime. QHS x 1-week and then Every Other night until prescription complete 12 suppository 0  ? hyoscyamine (LEVSIN SL) 0.125 MG SL tablet DISSOLVE ONE TABLET UNDER THE TONGUE 4 TIMES DAILY UP TO EVERY 4 HOURS AS NEEDED FOR NAUSEA, BLOATING, CRAMPING, OR DIARRHEA 100 tablet 0  ? Insulin NPH, Human,, Isophane, (HUMULIN N KWIKPEN) 100 UNIT/ML Kiwkpen Inject 18 Units into the skin every morning. And pen needles 1/day 30 mL 1  ? lidocaine-prilocaine (EMLA) cream Apply to affected area once 30 g 3  ? ondansetron (ZOFRAN ODT) 4 MG disintegrating tablet Take 1 tablet (4 mg total) by mouth every 8 (eight) hours as needed for nausea or vomiting. 20 tablet  0  ? ondansetron (ZOFRAN) 8 MG tablet Take 1 tablet (8 mg total) by mouth 2 (two) times daily as needed. Start on day 3 after chemotherapy. 30 tablet 1  ? oxyCODONE (ROXICODONE) 5 MG immediate release tablet Take 1 tablet (5 mg total) by mouth every 6 (six) hours as needed for up to 6 doses for severe pain. 30 tablet 0  ? prochlorperazine (COMPAZINE) 10 MG tablet Take 1 tablet (10 mg total) by mouth every 6 (six) hours as needed (Nausea or vomiting). 30 tablet 1  ? traMADol (ULTRAM) 50 MG tablet Take 1 tablet (50 mg total) by mouth every 6 (six) hours as  needed. 90 tablet 0  ? zolpidem (AMBIEN) 10 MG tablet Take 1 tablet (10 mg total) by mouth at bedtime as needed for sleep. 30 tablet 0  ? ?No current facility-administered medications for this visit.  ? ?Fac

## 2022-03-13 NOTE — Patient Instructions (Addendum)
check your blood sugar twice a day.  vary the time of day when you check, between before the 3 meals, and at bedtime.  also check if you have symptoms of your blood sugar being too high or too low.  please keep a record of the readings and bring it to your next appointment here (or you can bring the meter itself).  You can write it on any piece of paper.  please call us sooner if your blood sugar goes below 70, or if most of your readings are over 200.   ?We will need to take this complex situation in stages.   ?I have sent a prescription to your pharmacy, to change the Lantus to NPH, 18 units each morning.   ?On this type of insulin schedule, you should eat meals on a regular schedule.  If a meal is missed or significantly delayed, your blood sugar could go low.   ?You should have an endocrinology follow-up appointment in 2 months.   ?

## 2022-03-14 ENCOUNTER — Inpatient Hospital Stay: Payer: Medicare Other

## 2022-03-14 ENCOUNTER — Other Ambulatory Visit: Payer: Self-pay

## 2022-03-14 ENCOUNTER — Encounter: Payer: Self-pay | Admitting: Nurse Practitioner

## 2022-03-14 ENCOUNTER — Inpatient Hospital Stay (HOSPITAL_BASED_OUTPATIENT_CLINIC_OR_DEPARTMENT_OTHER): Payer: Medicare Other | Admitting: Nurse Practitioner

## 2022-03-14 VITALS — BP 133/74 | HR 79 | Temp 98.5°F | Wt 167.6 lb

## 2022-03-14 VITALS — Resp 17

## 2022-03-14 DIAGNOSIS — R739 Hyperglycemia, unspecified: Secondary | ICD-10-CM

## 2022-03-14 DIAGNOSIS — C259 Malignant neoplasm of pancreas, unspecified: Secondary | ICD-10-CM

## 2022-03-14 DIAGNOSIS — E860 Dehydration: Secondary | ICD-10-CM

## 2022-03-14 DIAGNOSIS — Z95828 Presence of other vascular implants and grafts: Secondary | ICD-10-CM

## 2022-03-14 DIAGNOSIS — Z5111 Encounter for antineoplastic chemotherapy: Secondary | ICD-10-CM | POA: Diagnosis not present

## 2022-03-14 DIAGNOSIS — E119 Type 2 diabetes mellitus without complications: Secondary | ICD-10-CM | POA: Diagnosis not present

## 2022-03-14 DIAGNOSIS — N182 Chronic kidney disease, stage 2 (mild): Secondary | ICD-10-CM

## 2022-03-14 LAB — CMP (CANCER CENTER ONLY)
ALT: 24 U/L (ref 0–44)
AST: 22 U/L (ref 15–41)
Albumin: 4.1 g/dL (ref 3.5–5.0)
Alkaline Phosphatase: 134 U/L — ABNORMAL HIGH (ref 38–126)
Anion gap: 8 (ref 5–15)
BUN: 18 mg/dL (ref 8–23)
CO2: 28 mmol/L (ref 22–32)
Calcium: 9.3 mg/dL (ref 8.9–10.3)
Chloride: 100 mmol/L (ref 98–111)
Creatinine: 0.93 mg/dL (ref 0.61–1.24)
GFR, Estimated: >60 mL/min (ref >60)
Glucose, Bld: 443 mg/dL — ABNORMAL HIGH (ref 70–99)
Potassium: 4.2 mmol/L (ref 3.5–5.1)
Sodium: 136 mmol/L (ref 135–145)
Total Bilirubin: 0.6 mg/dL (ref 0.3–1.2)
Total Protein: 7.1 g/dL (ref 6.5–8.1)

## 2022-03-14 LAB — CBC WITH DIFFERENTIAL (CANCER CENTER ONLY)
Abs Immature Granulocytes: 0.03 10*3/uL (ref 0.00–0.07)
Basophils Absolute: 0 10*3/uL (ref 0.0–0.1)
Basophils Relative: 1 %
Eosinophils Absolute: 0.2 10*3/uL (ref 0.0–0.5)
Eosinophils Relative: 3 %
HCT: 36.5 % — ABNORMAL LOW (ref 39.0–52.0)
Hemoglobin: 12.6 g/dL — ABNORMAL LOW (ref 13.0–17.0)
Immature Granulocytes: 1 %
Lymphocytes Relative: 16 %
Lymphs Abs: 1 10*3/uL (ref 0.7–4.0)
MCH: 34.1 pg — ABNORMAL HIGH (ref 26.0–34.0)
MCHC: 34.5 g/dL (ref 30.0–36.0)
MCV: 98.9 fL (ref 80.0–100.0)
Monocytes Absolute: 0.5 10*3/uL (ref 0.1–1.0)
Monocytes Relative: 8 %
Neutro Abs: 4.7 10*3/uL (ref 1.7–7.7)
Neutrophils Relative %: 71 %
Platelet Count: 97 10*3/uL — ABNORMAL LOW (ref 150–400)
RBC: 3.69 MIL/uL — ABNORMAL LOW (ref 4.22–5.81)
RDW: 13 % (ref 11.5–15.5)
WBC Count: 6.5 10*3/uL (ref 4.0–10.5)
nRBC: 0 % (ref 0.0–0.2)

## 2022-03-14 MED ORDER — INSULIN ASPART 100 UNIT/ML IJ SOLN
5.0000 [IU] | Freq: Once | INTRAMUSCULAR | Status: AC
  Administered 2022-03-14: 5 [IU] via SUBCUTANEOUS
  Filled 2022-03-14: qty 1

## 2022-03-14 MED ORDER — SODIUM CHLORIDE 0.9 % IV SOLN
150.0000 mg | Freq: Once | INTRAVENOUS | Status: AC
  Administered 2022-03-14: 150 mg via INTRAVENOUS
  Filled 2022-03-14: qty 150

## 2022-03-14 MED ORDER — SODIUM CHLORIDE 0.9 % IV SOLN
400.0000 mg/m2 | Freq: Once | INTRAVENOUS | Status: AC
  Administered 2022-03-14: 764 mg via INTRAVENOUS
  Filled 2022-03-14: qty 38.2

## 2022-03-14 MED ORDER — SODIUM CHLORIDE 0.9 % IV SOLN
150.0000 mg/m2 | Freq: Once | INTRAVENOUS | Status: AC
  Administered 2022-03-14: 280 mg via INTRAVENOUS
  Filled 2022-03-14: qty 14

## 2022-03-14 MED ORDER — DEXAMETHASONE SODIUM PHOSPHATE 10 MG/ML IJ SOLN
6.0000 mg | Freq: Once | INTRAMUSCULAR | Status: AC
  Administered 2022-03-14: 6 mg via INTRAVENOUS
  Filled 2022-03-14: qty 1

## 2022-03-14 MED ORDER — OXALIPLATIN CHEMO INJECTION 100 MG/20ML
50.0000 mg/m2 | Freq: Once | INTRAVENOUS | Status: AC
  Administered 2022-03-14: 95 mg via INTRAVENOUS
  Filled 2022-03-14: qty 19

## 2022-03-14 MED ORDER — SODIUM CHLORIDE 0.9% FLUSH
10.0000 mL | Freq: Once | INTRAVENOUS | Status: AC
  Administered 2022-03-14: 10 mL

## 2022-03-14 MED ORDER — DEXTROSE 5 % IV SOLN: Freq: Once | INTRAVENOUS | Status: AC

## 2022-03-14 MED ORDER — SODIUM CHLORIDE 0.9 % IV SOLN
2000.0000 mg/m2 | INTRAVENOUS | Status: DC
  Administered 2022-03-14: 3800 mg via INTRAVENOUS
  Filled 2022-03-14: qty 76

## 2022-03-14 MED ORDER — PALONOSETRON HCL INJECTION 0.25 MG/5ML
0.2500 mg | Freq: Once | INTRAVENOUS | Status: AC
  Administered 2022-03-14: 0.25 mg via INTRAVENOUS
  Filled 2022-03-14: qty 5

## 2022-03-14 MED ORDER — ATROPINE SULFATE 1 MG/ML IV SOLN
0.5000 mg | Freq: Once | INTRAVENOUS | Status: AC | PRN
  Administered 2022-03-14: 0.5 mg via INTRAVENOUS
  Filled 2022-03-14: qty 1

## 2022-03-14 NOTE — Patient Instructions (Signed)
[facility]  Discharge Instructions: ?Thank you for choosing [facility] to provide your oncology and hematology care.  ? ?If you have a lab appointment with the [facility], please go directly to the [facility] and check in at the registration area. ?  ?Wear comfortable clothing and clothing appropriate for easy access to any Portacath or PICC line.  ? ?We strive to give you quality time with your provider. You may need to reschedule your appointment if you arrive late (15 or more minutes).  Arriving late affects you and other patients whose appointments are after yours.  Also, if you miss three or more appointments without notifying the office, you may be dismissed from the clinic at the provider?s discretion.    ?  ?For prescription refill requests, have your pharmacy contact our office and allow 72 hours for refills to be completed.   ? ?Today you received the following chemotherapy and/or immunotherapy agents: Oxaliplatin, Irinotecan, Fluorouracil     ?  ?To help prevent nausea and vomiting after your treatment, we encourage you to take your nausea medication as directed. ? ?BELOW ARE SYMPTOMS THAT SHOULD BE REPORTED IMMEDIATELY: ?*FEVER GREATER THAN 100.4 F (38 ?C) OR HIGHER ?*CHILLS OR SWEATING ?*NAUSEA AND VOMITING THAT IS NOT CONTROLLED WITH YOUR NAUSEA MEDICATION ?*UNUSUAL SHORTNESS OF BREATH ?*UNUSUAL BRUISING OR BLEEDING ?*URINARY PROBLEMS (pain or burning when urinating, or frequent urination) ?*BOWEL PROBLEMS (unusual diarrhea, constipation, pain near the anus) ?TENDERNESS IN MOUTH AND THROAT WITH OR WITHOUT PRESENCE OF ULCERS (sore throat, sores in mouth, or a toothache) ?UNUSUAL RASH, SWELLING OR PAIN  ?UNUSUAL VAGINAL DISCHARGE OR ITCHING  ? ?Items with * indicate a potential emergency and should be followed up as soon as possible or go to the Emergency Department if any problems should occur. ? ?Please show the CHEMOTHERAPY ALERT CARD or  IMMUNOTHERAPY ALERT CARD at check-in to the Emergency Department and triage nurse. ? ?Should you have questions after your visit or need to cancel or reschedule your appointment, please contact [facility]  Dept: [phone]  and follow the prompts.  Office hours are 8:00 a.m. to 4:30 p.m. Monday - Friday. Please note that voicemails left after 4:00 p.m. may not be returned until the following business day.  We are closed weekends and major holidays. You have access to a nurse at all times for urgent questions. Please call the main number to the clinic Dept: [phone] and follow the prompts. ? ? ?For any non-urgent questions, you may also contact your provider using MyChart. We now offer e-Visits for anyone 66 and older to request care online for non-urgent symptoms. For details visit [email or web address]. ?  ?Also download the MyChart app! Go to the app store, search "MyChart", open the app, select [facility], and log in with your MyChart username and password. ? ?Due to Covid, a mask is required upon entering the hospital/clinic. If you do not have a mask, one will be given to you upon arrival. For doctor visits, patients may have 1 support person aged 66 or older with them. For treatment visits, patients cannot have anyone with them due to current Covid guidelines and our immunocompromised population.  ? ?

## 2022-03-14 NOTE — Progress Notes (Signed)
Ok to treat with plts 97 K/uL per Adin Hector, NP ?

## 2022-03-15 LAB — CANCER ANTIGEN 19-9: CA 19-9: 111 U/mL — ABNORMAL HIGH (ref 0–35)

## 2022-03-16 ENCOUNTER — Inpatient Hospital Stay: Payer: Medicare Other

## 2022-03-16 VITALS — BP 122/75 | HR 88 | Temp 97.9°F | Resp 16 | Ht 71.0 in

## 2022-03-16 DIAGNOSIS — E860 Dehydration: Secondary | ICD-10-CM

## 2022-03-16 DIAGNOSIS — C259 Malignant neoplasm of pancreas, unspecified: Secondary | ICD-10-CM

## 2022-03-16 DIAGNOSIS — Z95828 Presence of other vascular implants and grafts: Secondary | ICD-10-CM

## 2022-03-16 DIAGNOSIS — Z5111 Encounter for antineoplastic chemotherapy: Secondary | ICD-10-CM | POA: Diagnosis not present

## 2022-03-16 MED ORDER — SODIUM CHLORIDE 0.9 % IV SOLN: Freq: Once | INTRAVENOUS | Status: AC

## 2022-03-16 MED ORDER — SODIUM CHLORIDE 0.9% FLUSH
10.0000 mL | Freq: Once | INTRAVENOUS | Status: AC
  Administered 2022-03-16: 10 mL

## 2022-03-16 MED ORDER — HEPARIN SOD (PORK) LOCK FLUSH 100 UNIT/ML IV SOLN
500.0000 [IU] | Freq: Once | INTRAVENOUS | Status: AC
  Administered 2022-03-16: 500 [IU]

## 2022-03-16 MED ORDER — PEGFILGRASTIM-CBQV 6 MG/0.6ML ~~LOC~~ SOSY
6.0000 mg | PREFILLED_SYRINGE | Freq: Once | SUBCUTANEOUS | Status: AC
  Administered 2022-03-16: 6 mg via SUBCUTANEOUS
  Filled 2022-03-16: qty 0.6

## 2022-03-16 NOTE — Progress Notes (Signed)
Ok to run IVF over 1 hour today per Dr. Burr Medico. ?

## 2022-03-22 ENCOUNTER — Telehealth: Payer: Self-pay | Admitting: Hematology

## 2022-03-22 NOTE — Telephone Encounter (Signed)
Scheduled follow-up appointments per 4/27 los. Patient is aware. 

## 2022-04-03 MED FILL — Fosaprepitant Dimeglumine For IV Infusion 150 MG (Base Eq): INTRAVENOUS | Qty: 5 | Status: AC

## 2022-04-04 ENCOUNTER — Inpatient Hospital Stay: Payer: Medicare Other | Attending: Hematology

## 2022-04-04 ENCOUNTER — Encounter: Payer: Self-pay | Admitting: Hematology

## 2022-04-04 ENCOUNTER — Inpatient Hospital Stay (HOSPITAL_BASED_OUTPATIENT_CLINIC_OR_DEPARTMENT_OTHER): Payer: Medicare Other | Admitting: Hematology

## 2022-04-04 ENCOUNTER — Other Ambulatory Visit: Payer: Self-pay

## 2022-04-04 ENCOUNTER — Inpatient Hospital Stay: Payer: Medicare Other

## 2022-04-04 DIAGNOSIS — C250 Malignant neoplasm of head of pancreas: Secondary | ICD-10-CM | POA: Diagnosis not present

## 2022-04-04 DIAGNOSIS — D696 Thrombocytopenia, unspecified: Secondary | ICD-10-CM | POA: Diagnosis not present

## 2022-04-04 DIAGNOSIS — R5383 Other fatigue: Secondary | ICD-10-CM | POA: Insufficient documentation

## 2022-04-04 DIAGNOSIS — I700 Atherosclerosis of aorta: Secondary | ICD-10-CM | POA: Insufficient documentation

## 2022-04-04 DIAGNOSIS — E119 Type 2 diabetes mellitus without complications: Secondary | ICD-10-CM | POA: Diagnosis not present

## 2022-04-04 DIAGNOSIS — Z5189 Encounter for other specified aftercare: Secondary | ICD-10-CM | POA: Diagnosis not present

## 2022-04-04 DIAGNOSIS — R109 Unspecified abdominal pain: Secondary | ICD-10-CM | POA: Diagnosis not present

## 2022-04-04 DIAGNOSIS — F419 Anxiety disorder, unspecified: Secondary | ICD-10-CM | POA: Diagnosis not present

## 2022-04-04 DIAGNOSIS — C259 Malignant neoplasm of pancreas, unspecified: Secondary | ICD-10-CM

## 2022-04-04 DIAGNOSIS — G629 Polyneuropathy, unspecified: Secondary | ICD-10-CM | POA: Diagnosis not present

## 2022-04-04 DIAGNOSIS — Z87442 Personal history of urinary calculi: Secondary | ICD-10-CM | POA: Diagnosis not present

## 2022-04-04 DIAGNOSIS — G4700 Insomnia, unspecified: Secondary | ICD-10-CM | POA: Insufficient documentation

## 2022-04-04 DIAGNOSIS — Z5111 Encounter for antineoplastic chemotherapy: Secondary | ICD-10-CM | POA: Diagnosis present

## 2022-04-04 LAB — CBC WITH DIFFERENTIAL (CANCER CENTER ONLY)
Abs Immature Granulocytes: 0.02 10*3/uL (ref 0.00–0.07)
Basophils Absolute: 0 10*3/uL (ref 0.0–0.1)
Basophils Relative: 0 %
Eosinophils Absolute: 0.2 10*3/uL (ref 0.0–0.5)
Eosinophils Relative: 3 %
HCT: 35.3 % — ABNORMAL LOW (ref 39.0–52.0)
Hemoglobin: 12.2 g/dL — ABNORMAL LOW (ref 13.0–17.0)
Immature Granulocytes: 0 %
Lymphocytes Relative: 18 %
Lymphs Abs: 1 10*3/uL (ref 0.7–4.0)
MCH: 33.9 pg (ref 26.0–34.0)
MCHC: 34.6 g/dL (ref 30.0–36.0)
MCV: 98.1 fL (ref 80.0–100.0)
Monocytes Absolute: 0.6 10*3/uL (ref 0.1–1.0)
Monocytes Relative: 11 %
Neutro Abs: 3.7 10*3/uL (ref 1.7–7.7)
Neutrophils Relative %: 68 %
Platelet Count: 76 10*3/uL — ABNORMAL LOW (ref 150–400)
RBC: 3.6 MIL/uL — ABNORMAL LOW (ref 4.22–5.81)
RDW: 12.6 % (ref 11.5–15.5)
WBC Count: 5.4 10*3/uL (ref 4.0–10.5)
nRBC: 0 % (ref 0.0–0.2)

## 2022-04-04 LAB — CMP (CANCER CENTER ONLY)
ALT: 21 U/L (ref 0–44)
AST: 18 U/L (ref 15–41)
Albumin: 3.9 g/dL (ref 3.5–5.0)
Alkaline Phosphatase: 131 U/L — ABNORMAL HIGH (ref 38–126)
Anion gap: 7 (ref 5–15)
BUN: 13 mg/dL (ref 8–23)
CO2: 26 mmol/L (ref 22–32)
Calcium: 8.9 mg/dL (ref 8.9–10.3)
Chloride: 103 mmol/L (ref 98–111)
Creatinine: 0.88 mg/dL (ref 0.61–1.24)
GFR, Estimated: >60 mL/min (ref >60)
Glucose, Bld: 423 mg/dL — ABNORMAL HIGH (ref 70–99)
Potassium: 4.2 mmol/L (ref 3.5–5.1)
Sodium: 136 mmol/L (ref 135–145)
Total Bilirubin: 0.4 mg/dL (ref 0.3–1.2)
Total Protein: 6.8 g/dL (ref 6.5–8.1)

## 2022-04-04 MED ORDER — DEXAMETHASONE SODIUM PHOSPHATE 10 MG/ML IJ SOLN
6.0000 mg | Freq: Once | INTRAMUSCULAR | Status: AC
  Administered 2022-04-04: 6 mg via INTRAVENOUS
  Filled 2022-04-04: qty 1

## 2022-04-04 MED ORDER — OXALIPLATIN CHEMO INJECTION 100 MG/20ML
40.0000 mg/m2 | Freq: Once | INTRAVENOUS | Status: AC
  Administered 2022-04-04: 75 mg via INTRAVENOUS
  Filled 2022-04-04: qty 15

## 2022-04-04 MED ORDER — ZOLPIDEM TARTRATE 10 MG PO TABS
10.0000 mg | ORAL_TABLET | Freq: Every evening | ORAL | 0 refills | Status: DC | PRN
Start: 2022-04-04 — End: 2022-05-08

## 2022-04-04 MED ORDER — SODIUM CHLORIDE 0.9 % IV SOLN
400.0000 mg/m2 | Freq: Once | INTRAVENOUS | Status: AC
  Administered 2022-04-04: 764 mg via INTRAVENOUS
  Filled 2022-04-04: qty 38.2

## 2022-04-04 MED ORDER — SODIUM CHLORIDE 0.9% FLUSH: 10.0000 mL | INTRAVENOUS | Status: DC | PRN

## 2022-04-04 MED ORDER — DEXTROSE 5 % IV SOLN: Freq: Once | INTRAVENOUS | Status: AC

## 2022-04-04 MED ORDER — SODIUM CHLORIDE 0.9 % IV SOLN
150.0000 mg/m2 | Freq: Once | INTRAVENOUS | Status: AC
  Administered 2022-04-04: 280 mg via INTRAVENOUS
  Filled 2022-04-04: qty 14

## 2022-04-04 MED ORDER — ATROPINE SULFATE 1 MG/ML IV SOLN
0.5000 mg | Freq: Once | INTRAVENOUS | Status: AC | PRN
  Administered 2022-04-04: 0.5 mg via INTRAVENOUS
  Filled 2022-04-04: qty 1

## 2022-04-04 MED ORDER — SODIUM CHLORIDE 0.9 % IV SOLN
2000.0000 mg/m2 | INTRAVENOUS | Status: DC
  Administered 2022-04-04: 3800 mg via INTRAVENOUS
  Filled 2022-04-04: qty 76

## 2022-04-04 MED ORDER — PALONOSETRON HCL INJECTION 0.25 MG/5ML
0.2500 mg | Freq: Once | INTRAVENOUS | Status: AC
  Administered 2022-04-04: 0.25 mg via INTRAVENOUS
  Filled 2022-04-04: qty 5

## 2022-04-04 MED ORDER — ALPRAZOLAM 0.25 MG PO TABS: 0.2500 mg | ORAL_TABLET | Freq: Two times a day (BID) | ORAL | 0 refills | Status: DC | PRN

## 2022-04-04 MED ORDER — SODIUM CHLORIDE 0.9 % IV SOLN
150.0000 mg | Freq: Once | INTRAVENOUS | Status: AC
  Administered 2022-04-04: 150 mg via INTRAVENOUS
  Filled 2022-04-04: qty 150

## 2022-04-04 MED ORDER — TRAMADOL HCL 50 MG PO TABS: 50.0000 mg | ORAL_TABLET | Freq: Four times a day (QID) | ORAL | 0 refills | Status: DC | PRN

## 2022-04-04 MED ORDER — HEPARIN SOD (PORK) LOCK FLUSH 100 UNIT/ML IV SOLN: 500.0000 [IU] | Freq: Once | INTRAVENOUS | Status: DC | PRN

## 2022-04-04 NOTE — Patient Instructions (Signed)

## 2022-04-04 NOTE — Patient Instructions (Signed)
[facility]  Discharge Instructions: Thank you for choosing [facility] to provide your oncology and hematology care.   If you have a lab appointment with the [facility], please go directly to the [facility] and check in at the registration area.   Wear comfortable clothing and clothing appropriate for easy access to any Portacath or PICC line.   We strive to give you quality time with your provider. You may need to reschedule your appointment if you arrive late (15 or more minutes).  Arriving late affects you and other patients whose appointments are after yours.  Also, if you miss three or more appointments without notifying the office, you may be dismissed from the clinic at the provider's discretion.      For prescription refill requests, have your pharmacy contact our office and allow 72 hours for refills to be completed.    Today you received the following chemotherapy and/or immunotherapy agents: Oxaliplatin, Irinotecan, Fluorouracil       To help prevent nausea and vomiting after your treatment, we encourage you to take your nausea medication as directed.  BELOW ARE SYMPTOMS THAT SHOULD BE REPORTED IMMEDIATELY: *FEVER GREATER THAN 100.4 F (38 C) OR HIGHER *CHILLS OR SWEATING *NAUSEA AND VOMITING THAT IS NOT CONTROLLED WITH YOUR NAUSEA MEDICATION *UNUSUAL SHORTNESS OF BREATH *UNUSUAL BRUISING OR BLEEDING *URINARY PROBLEMS (pain or burning when urinating, or frequent urination) *BOWEL PROBLEMS (unusual diarrhea, constipation, pain near the anus) TENDERNESS IN MOUTH AND THROAT WITH OR WITHOUT PRESENCE OF ULCERS (sore throat, sores in mouth, or a toothache) UNUSUAL RASH, SWELLING OR PAIN  UNUSUAL VAGINAL DISCHARGE OR ITCHING   Items with * indicate a potential emergency and should be followed up as soon as possible or go to the Emergency Department if any problems should occur.  Please show the CHEMOTHERAPY ALERT CARD or  IMMUNOTHERAPY ALERT CARD at check-in to the Emergency Department and triage nurse.  Should you have questions after your visit or need to cancel or reschedule your appointment, please contact [facility]  Dept: [phone]  and follow the prompts.  Office hours are 8:00 a.m. to 4:30 p.m. Monday - Friday. Please note that voicemails left after 4:00 p.m. may not be returned until the following business day.  We are closed weekends and major holidays. You have access to a nurse at all times for urgent questions. Please call the main number to the clinic Dept: [phone] and follow the prompts.   For any non-urgent questions, you may also contact your provider using MyChart. We now offer e-Visits for anyone 64 and older to request care online for non-urgent symptoms. For details visit [email or web address].   Also download the MyChart app! Go to the app store, search "MyChart", open the app, select [facility], and log in with your MyChart username and password.  Due to Covid, a mask is required upon entering the hospital/clinic. If you do not have a mask, one will be given to you upon arrival. For doctor visits, patients may have 1 support person aged 12 or older with them. For treatment visits, patients cannot have anyone with them due to current Covid guidelines and our immunocompromised population.

## 2022-04-04 NOTE — Addendum Note (Signed)
Addended by: [name] on: 04/04/2022 03:49 PM   Modules accepted: Orders

## 2022-04-04 NOTE — Progress Notes (Signed)
Ok to treat with platelet count of 76 K/uL today per Dr. Burr Medico. Chemo dose reduced.

## 2022-04-04 NOTE — Progress Notes (Signed)
Zach[facility]   Telephone:([phone]) [phone] Fax:(336) [phone]   Clinic Follow up Note   Patient Care Team: [name], MD as PCP - General (Internal Medicine) [name], NP as PCP - Hematology/Oncology (Nurse Practitioner) [name], MD as Consulting Physician (Orthopedic Surgery) [name], MD as Consulting Physician (Cardiology) [name], MD as Consulting Physician (Oncology) Royston Bake, RN as Oncology Nurse Navigator (Oncology)  Date of Service:  04/04/2022  CHIEF COMPLAINT: f/u of metastatic pancreatic cancer  CURRENT THERAPY:  First line FOLFIRINOX, starting 08/13/21, now q3weeks  ASSESSMENT & PLAN:  Zachary Reid is a 67 y.o. male with   1. Metastatic pancreatic adenocarcinoma, ER1V4M0 with numerous small cavitary lesions in bilateral lungs, MMR normal  -found incidentally on recent CT scan for left kidney stone. He also had some nonspecific symptoms, including epigastric pain, chronic right flank pain, dry cough, and significant weight loss in the past several years. -colonoscopy and EUS on 07/26/21 demonstrated a solid-cystic mass in the genu-body of the pancreas, with endosonographic evidence of SMA abutment, T4N0. Cytology confirmed adenocarcinoma.  -Baseline CA 19-9 elevated to 273 on 07/03/21 -He began first-line mFOLFIRINOX on 08/13/21, tolerated moderately well with severe fatigue for 3-7 days, constipation, and mild pancytopenia. He is able to recover well. GCSF added with cycle 2 -restaging CT CAP 01/28/22 showed overall stable disease. His numerous lung nodules with cavitation are slightly less thick-walled; similar pancreatic lesion. I think he had partial response overall  -his CA 19-9 is overall stable, slightly trended up most recently 111 on 03/14/22. Today's result is pending. -he is now receiving treatment every 3 weeks, which he tolerates much better. Labs reviewed, platelets have dropped further to 76k today. Will proceed at  reduced dose, will decrease oxaliplatin dose further to 40 mg/m, due to his neuropathy and thrombocytopenia.   2. Symptom Management: abdominal pain, neuropathy -he also reports intermittent abdominal pain; he uses tramadol as needed, up to twice a day. 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 He is undergoing a form of light therapy and feels it is helpful.   3. Anxiety, Insomnia -anxiety began 2022. He was started on lexapro on 04/19/21 and wellbutrin on 06/05/21 but came off them in August due to constipation -reported new anxiety attacks since diagnosis. Rx Xanax on 07/12/21, using as needed as prescribed. -he notes he still has insomnia. Ambien prescribed 07/27/21, brand is more effective than generic. I previously reviewed that he should not take ambien, tramadol, or xanax at the same time. -he was previously referred to Dr. Michail Sermon, did not find this helpful and does not wish to f/u.   4. Genetic Testing -he reports pancreatic cancer in his mother, prostate cancer in his father, ovarian in PGM, and lung in PGF (smoker). -he proceeded with testing on 07/30/21. 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 Left kidney stone, right flank pain -f/u urology for kidney stone   6. DM -he is now followed by Dr. Loanne Drilling, now on metformin and glucotrol. -BG has remained elevated lately, 423 today (04/04/22)   7.  Peripheral neuropathy, grade 1 -Started in March 2023, likely secondary to oxaliplatin -We will decrease oxaliplatin dose, continue monitor closely. -He is on low-dose gabapentin, he has tried light therapy which helped. We discussed he can increase his gabapentin dose if needed.     PLAN: -proceed with C13 FOLFIRINOX with further reduced oxaliplatin dose to 40 mg/m due to neuropathy and thrombocytopenia             -  Udenyca and 1h IVF on day 3 -I refilled his tramadol, ambien, and Xanax -lab, flush, f/u, and FOLFIRINOX in 3 and 6 weeks             -he prefers Thursday  morning appointments -restaging CT to be done in 5-6 weeks before treatment   No problem-specific Assessment & Plan notes found for this encounter.   SUMMARY OF ONCOLOGIC HISTORY: Oncology History Overview Note   Cancer Staging  Pancreatic adenocarcinoma [facility]) Staging form: Exocrine Pancreas, AJCC 8th Edition - Clinical stage from 07/26/2021: Stage IV (cT4, cN0, cM1) - Signed by Malachy Mood, MD on 07/28/2021    Pancreatic adenocarcinoma (HCC)  07/02/2021 Initial Diagnosis   Pancreatic adenocarcinoma (HCC)    07/26/2021 Cancer Staging   Staging form: Exocrine Pancreas, AJCC 8th Edition - Clinical stage from 07/26/2021: Stage IV (cT4, cN0, cM1) - Signed by Malachy Mood, MD on 07/28/2021 Stage prefix: Initial diagnosis Total positive nodes: 0    08/13/2021 -  Chemotherapy   Patient is on Treatment Plan : PANCREAS Modified FOLFIRINOX q14d x 4 cycles       Genetic Testing   [facility] CancerNext-Expanded results (77 genes) were negative. No pathogenic variants were identified. 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 The report date is 08/15/2021.    The CancerNext-Expanded gene panel offered by [facility] and includes sequencing, rearrangement, and RNA analysis for the following 77 genes: AIP, ALK, APC, ATM, AXIN2, BAP1, BARD1, BLM, BMPR1A, BRCA1, BRCA2, BRIP1, CDC73, CDH1, CDK4, CDKN1B, CDKN2A, CHEK2, CTNNA1, DICER1, FANCC, FH, FLCN, GALNT12, KIF1B, LZTR1, MAX, MEN1, MET, MLH1, MSH2, MSH3, MSH6, MUTYH, NBN, NF1, NF2, NTHL1, PALB2, PHOX2B, PMS2, POT1, PRKAR1A, PTCH1, PTEN, RAD51C, RAD51D, RB1, RECQL, RET, SDHA, SDHAF2, SDHB, SDHC, SDHD, SMAD4, SMARCA4, SMARCB1, SMARCE1, STK11, SUFU, TMEM127, TP53, TSC1, TSC2, VHL and XRCC2 (sequencing and deletion/duplication); EGFR, EGLN1, HOXB13, KIT, MITF, PDGFRA, POLD1, and POLE (sequencing only); EPCAM and GREM1 (deletion/duplication only).     10/25/2021 Imaging   EXAM: CT CHEST, ABDOMEN, AND PELVIS WITH  CONTRAST  IMPRESSION: 1. Innumerable bilateral pulmonary nodules, many of which are cavitary, consistent with metastatic disease. These nodules show no substantial change in are minimally progressed in the interval. 2. Mix cystic and solid lesion in the head and body of the pancreas is similar to prior and also comparing back to MRI 07/02/2021. 3. Hepatic cysts. 4. 8 mm nonobstructing left renal stone. 5. Aortic Atherosclerosis (ICD10-I70.0).   01/28/2022 Imaging   EXAM: CT CHEST, ABDOMEN, AND PELVIS WITH CONTRAST  IMPRESSION: 1. Innumerable bilateral small solid and cavitary pulmonary nodules, some of the cavitary nodules are slightly less thick-walled when compared with prior exam. 2. Mixed cystic and solid lesion in the head and body of the pancreas is similar to prior exam. 3. Nonobstructing left renal stone. 4.  Aortic Atherosclerosis (ICD10-I70.0).      INTERVAL HISTORY:  Zachary Reid is here for a follow up of metastatic pancreatic cancer. He was last seen by NP Lacie on 03/14/22. He presents to the clinic accompanied by his wife. He reports he is doing better with the extra week between treatments. He reports he continues to have fatigue after infusion. He does note his symptoms fluctuate-- he explains he had one day of feeling "cured" (no symptoms or pain), but he felt bad again the next day. He reports his neuropathy improves the longer he is from infusion-- in his feet is nearly resolved and improving in his fingers (from 7/10 to 2/10).  All other systems were reviewed with the patient and are negative.  MEDICAL HISTORY:  Past Medical History:  Diagnosis Date   Abnormal EKG    DM (diabetes mellitus) ([place])    Hyperlipidemia    Hypogonadism male    IBS (irritable bowel syndrome)    Obesity    BMI 31   pancreatic ca 06/28/2021   Vitamin D deficiency     SURGICAL HISTORY: Past Surgical History:  Procedure Laterality Date   APPENDECTOMY     BIOPSY  07/26/2021    Procedure: BIOPSY;  Surgeon: Irving Copas., MD;  Location: [facility];  Service: Gastroenterology;;   COLONOSCOPY WITH PROPOFOL N/A 07/26/2021   Procedure: COLONOSCOPY WITH PROPOFOL;  Surgeon: Irving Copas., MD;  Location: [facility];  Service: Gastroenterology;  Laterality: N/A;   ESOPHAGOGASTRODUODENOSCOPY (EGD) WITH PROPOFOL N/A 07/26/2021   Procedure: ESOPHAGOGASTRODUODENOSCOPY (EGD) WITH PROPOFOL;  Surgeon: Rush Landmark Telford Nab., MD;  Location: [facility];  Service: Gastroenterology;  Laterality: N/A;   EUS N/A 07/26/2021   Procedure: UPPER ENDOSCOPIC ULTRASOUND (EUS) RADIAL;  Surgeon: Irving Copas., MD;  Location: [facility];  Service: Gastroenterology;  Laterality: N/A;   FINE NEEDLE ASPIRATION  07/26/2021   Procedure: FINE NEEDLE ASPIRATION (FNA) LINEAR;  Surgeon: Rush Landmark Telford Nab., MD;  Location: [facility];  Service: Gastroenterology;;   IR IMAGING GUIDED PORT INSERTION  08/10/2021   POLYPECTOMY  07/26/2021   Procedure: POLYPECTOMY;  Surgeon: Rush Landmark Telford Nab., MD;  Location: [facility];  Service: Gastroenterology;;   SHOULDER SURGERY Right     I have reviewed the social history and family history with the patient and they are unchanged from previous note.  ALLERGIES:  is allergic to lipitor [atorvastatin].  MEDICATIONS:  Current Outpatient Medications  Medication Sig Dispense Refill   ALPRAZolam (XANAX) 0.25 MG tablet Take 1-2 tablets (0.25-0.5 mg total) by mouth 2 (two) times daily as needed for anxiety. 60 tablet 0   aspirin EC 81 MG tablet Take 81 mg by mouth in the morning and at bedtime. Swallow whole.     Cholecalciferol (VITAMIN D3) 125 MCG (5000 UT) CAPS Take 5,000 Units by mouth in the morning and at bedtime.     gabapentin (NEURONTIN) 600 MG tablet Take 1 tablet (600 mg total) by mouth 3 (three) times daily. 90 tablet 1   glucose blood test strip Use as instructed 100 each 12   hydrocortisone (ANUSOL-HC) 25 MG suppository  Place 1 suppository (25 mg total) rectally at bedtime. QHS x 1-week and then Every Other night until prescription complete 12 suppository 0   hyoscyamine (LEVSIN SL) 0.125 MG SL tablet DISSOLVE ONE TABLET UNDER THE TONGUE 4 TIMES DAILY UP TO EVERY 4 HOURS AS NEEDED FOR NAUSEA, BLOATING, CRAMPING, OR DIARRHEA 100 tablet 0   Insulin NPH, Human,, Isophane, (HUMULIN N KWIKPEN) 100 UNIT/ML Kiwkpen Inject 18 Units into the skin every morning. And pen needles 1/day 30 mL 1   lidocaine-prilocaine (EMLA) cream Apply to affected area once 30 g 3   ondansetron (ZOFRAN ODT) 4 MG disintegrating tablet Take 1 tablet (4 mg total) by mouth every 8 (eight) hours as needed for nausea or vomiting. 20 tablet 0   ondansetron (ZOFRAN) 8 MG tablet Take 1 tablet (8 mg total) by mouth 2 (two) times daily as needed. Start on day 3 after chemotherapy. 30 tablet 1   oxyCODONE (ROXICODONE) 5 MG immediate release tablet Take 1 tablet (5 mg total) by mouth every 6 (six) hours as needed for up to 6  doses for severe pain. 30 tablet 0   prochlorperazine (COMPAZINE) 10 MG tablet Take 1 tablet (10 mg total) by mouth every 6 (six) hours as needed (Nausea or vomiting). 30 tablet 1   traMADol (ULTRAM) 50 MG tablet Take 1 tablet (50 mg total) by mouth every 6 (six) hours as needed. 90 tablet 0   zolpidem (AMBIEN) 10 MG tablet Take 1 tablet (10 mg total) by mouth at bedtime as needed for sleep. 30 tablet 0   No current facility-administered medications for this visit.   Facility-Administered Medications Ordered in Other Visits  Medication Dose Route Frequency Provider Last Rate Last Admin   atropine injection 0.5 mg  0.5 mg Intravenous Once PRN [name], MD       fluorouracil (ADRUCIL) 3,800 mg in sodium chloride 0.9 % 74 mL chemo infusion  2,000 mg/m2 (Treatment Plan Recorded) Intravenous 1 day or 1 dose [name], MD       heparin lock flush 100 unit/mL  500 Units Intracatheter Once PRN [name], MD       irinotecan (CAMPTOSAR) 280 mg  in sodium chloride 0.9 % 500 mL chemo infusion  150 mg/m2 (Treatment Plan Recorded) Intravenous Once [name], MD       leucovorin 764 mg in sodium chloride 0.9 % 250 mL infusion  400 mg/m2 (Treatment Plan Recorded) Intravenous Once [name], MD       oxaliplatin (ELOXATIN) 75 mg in dextrose 5 % 500 mL chemo infusion  40 mg/m2 (Treatment Plan Recorded) Intravenous Once [name], MD 258 mL/hr at 04/04/22 1044 75 mg at 04/04/22 1044   sodium chloride flush (NS) 0.9 % injection 10 mL  10 mL Intracatheter PRN [name], MD        PHYSICAL EXAMINATION: ECOG PERFORMANCE STATUS: 1 - Symptomatic but completely ambulatory  There were no vitals filed for this visit. Wt Readings from Last 3 Encounters:  04/04/22 169 lb 3.2 oz (76.7 kg)  03/14/22 167 lb 9.6 oz (76 kg)  03/13/22 166 lb 3.2 oz (75.4 kg)     GENERAL:alert, no distress and comfortable SKIN: skin color normal, no rashes or significant lesions EYES: normal, Conjunctiva are pink and non-injected, sclera clear  NEURO: alert & oriented x 3 with fluent speech  LABORATORY DATA:  I have reviewed the data as listed    Latest Ref Rng & Units 04/04/2022    8:10 AM 03/14/2022    9:20 AM 02/21/2022    8:53 AM  CBC  WBC 4.0 - 10.5 K/uL 5.4   6.5   6.9    Hemoglobin 13.0 - 17.0 g/dL 12.2   12.6   11.7    Hematocrit 39.0 - 52.0 % 35.3   36.5   34.2    Platelets 150 - 400 K/uL 76   97   90          Latest Ref Rng & Units 04/04/2022    8:10 AM 03/14/2022    9:20 AM 02/21/2022    8:53 AM  CMP  Glucose 70 - 99 mg/dL 423   443   251    BUN 8 - 23 mg/dL $Remove'13   18   18    'PCuMNht$ Creatinine 0.61 - 1.24 mg/dL 0.88   0.93   0.76    Sodium 135 - 145 mmol/L 136   136   139    Potassium 3.5 - 5.1 mmol/L 4.2   4.2   3.9    Chloride 98 - 111  mmol/L 103   100   105    CO2 22 - 32 mmol/L $RemoveB'26   28   27    'RTYFLnCu$ Calcium 8.9 - 10.3 mg/dL 8.9   9.3   8.9    Total Protein 6.5 - 8.1 g/dL 6.8   7.1   6.6    Total Bilirubin 0.3 - 1.2 mg/dL 0.4   0.6   0.7    Alkaline Phos 38  - 126 U/L 131   134   110    AST 15 - 41 U/L $Remo'18   22   22    'VOMRL$ ALT 0 - 44 U/L $Remo'21   24   24        'QFSHM$ RADIOGRAPHIC STUDIES: I have personally reviewed the radiological images as listed and agreed with the findings in the report. No results found.    Orders Placed This Encounter  Procedures   CT CHEST ABDOMEN PELVIS W CONTRAST    Standing Status:   Future    Standing Expiration Date:   04/05/2023    Order Specific Question:   Preferred imaging location?    Answer:   [facility]    Order Specific Question:   Release to patient    Answer:   Immediate    Order Specific Question:   Is Oral Contrast requested for this exam?    Answer:   Yes, Per Radiology protocol   All questions were answered. The patient knows to call the clinic with any problems, questions or concerns. No barriers to learning was detected. The total time spent in the appointment was 30 minutes.     [name], MD 04/04/2022   I, Wilburn Mylar, am acting as scribe for [name], MD.   I have reviewed the above documentation for accuracy and completeness, and I agree with the above.

## 2022-04-05 ENCOUNTER — Telehealth: Payer: Self-pay | Admitting: Hematology

## 2022-04-05 LAB — CANCER ANTIGEN 19-9: CA 19-9: 116 U/mL — ABNORMAL HIGH (ref 0–35)

## 2022-04-05 NOTE — Telephone Encounter (Signed)
Scheduled follow-up appointments per 5/18 los. Patient is aware.

## 2022-04-06 ENCOUNTER — Other Ambulatory Visit: Payer: Self-pay

## 2022-04-06 ENCOUNTER — Inpatient Hospital Stay: Payer: Medicare Other

## 2022-04-06 VITALS — BP 112/71 | HR 61 | Temp 97.5°F | Resp 15

## 2022-04-06 DIAGNOSIS — E860 Dehydration: Secondary | ICD-10-CM

## 2022-04-06 DIAGNOSIS — Z95828 Presence of other vascular implants and grafts: Secondary | ICD-10-CM

## 2022-04-06 DIAGNOSIS — Z5111 Encounter for antineoplastic chemotherapy: Secondary | ICD-10-CM | POA: Diagnosis not present

## 2022-04-06 DIAGNOSIS — C259 Malignant neoplasm of pancreas, unspecified: Secondary | ICD-10-CM

## 2022-04-06 MED ORDER — SODIUM CHLORIDE 0.9% FLUSH
10.0000 mL | Freq: Once | INTRAVENOUS | Status: AC
  Administered 2022-04-06: 10 mL

## 2022-04-06 MED ORDER — HEPARIN SOD (PORK) LOCK FLUSH 100 UNIT/ML IV SOLN
500.0000 [IU] | Freq: Once | INTRAVENOUS | Status: AC
  Administered 2022-04-06: 500 [IU]

## 2022-04-06 MED ORDER — PEGFILGRASTIM-CBQV 6 MG/0.6ML ~~LOC~~ SOSY
6.0000 mg | PREFILLED_SYRINGE | Freq: Once | SUBCUTANEOUS | Status: AC
  Administered 2022-04-06: 6 mg via SUBCUTANEOUS
  Filled 2022-04-06: qty 0.6

## 2022-04-06 MED ORDER — SODIUM CHLORIDE 0.9 % IV SOLN: Freq: Once | INTRAVENOUS | Status: AC

## 2022-04-06 NOTE — Patient Instructions (Signed)

## 2022-04-11 ENCOUNTER — Encounter: Payer: Self-pay | Admitting: Internal Medicine

## 2022-04-24 MED FILL — Fosaprepitant Dimeglumine For IV Infusion 150 MG (Base Eq): INTRAVENOUS | Qty: 5 | Status: AC

## 2022-04-25 ENCOUNTER — Inpatient Hospital Stay: Payer: Medicare Other

## 2022-04-25 ENCOUNTER — Inpatient Hospital Stay: Payer: Medicare Other | Attending: Hematology

## 2022-04-25 ENCOUNTER — Inpatient Hospital Stay (HOSPITAL_BASED_OUTPATIENT_CLINIC_OR_DEPARTMENT_OTHER): Payer: Medicare Other | Admitting: Adult Health

## 2022-04-25 ENCOUNTER — Other Ambulatory Visit: Payer: Self-pay

## 2022-04-25 ENCOUNTER — Encounter: Payer: Self-pay | Admitting: Adult Health

## 2022-04-25 VITALS — HR 86

## 2022-04-25 VITALS — BP 113/57 | HR 115 | Temp 98.5°F | Resp 18 | Ht 71.0 in | Wt 166.4 lb

## 2022-04-25 DIAGNOSIS — Z800 Family history of malignant neoplasm of digestive organs: Secondary | ICD-10-CM | POA: Diagnosis not present

## 2022-04-25 DIAGNOSIS — C259 Malignant neoplasm of pancreas, unspecified: Secondary | ICD-10-CM | POA: Diagnosis not present

## 2022-04-25 DIAGNOSIS — G620 Drug-induced polyneuropathy: Secondary | ICD-10-CM | POA: Diagnosis not present

## 2022-04-25 DIAGNOSIS — C7802 Secondary malignant neoplasm of left lung: Secondary | ICD-10-CM | POA: Insufficient documentation

## 2022-04-25 DIAGNOSIS — D6959 Other secondary thrombocytopenia: Secondary | ICD-10-CM | POA: Insufficient documentation

## 2022-04-25 DIAGNOSIS — I129 Hypertensive chronic kidney disease with stage 1 through stage 4 chronic kidney disease, or unspecified chronic kidney disease: Secondary | ICD-10-CM | POA: Insufficient documentation

## 2022-04-25 DIAGNOSIS — T451X5A Adverse effect of antineoplastic and immunosuppressive drugs, initial encounter: Secondary | ICD-10-CM | POA: Diagnosis not present

## 2022-04-25 DIAGNOSIS — N200 Calculus of kidney: Secondary | ICD-10-CM | POA: Insufficient documentation

## 2022-04-25 DIAGNOSIS — Z8042 Family history of malignant neoplasm of prostate: Secondary | ICD-10-CM | POA: Insufficient documentation

## 2022-04-25 DIAGNOSIS — K7689 Other specified diseases of liver: Secondary | ICD-10-CM | POA: Diagnosis not present

## 2022-04-25 DIAGNOSIS — E1122 Type 2 diabetes mellitus with diabetic chronic kidney disease: Secondary | ICD-10-CM

## 2022-04-25 DIAGNOSIS — I700 Atherosclerosis of aorta: Secondary | ICD-10-CM | POA: Insufficient documentation

## 2022-04-25 DIAGNOSIS — E785 Hyperlipidemia, unspecified: Secondary | ICD-10-CM | POA: Diagnosis not present

## 2022-04-25 DIAGNOSIS — D61810 Antineoplastic chemotherapy induced pancytopenia: Secondary | ICD-10-CM | POA: Diagnosis not present

## 2022-04-25 DIAGNOSIS — C251 Malignant neoplasm of body of pancreas: Secondary | ICD-10-CM | POA: Diagnosis not present

## 2022-04-25 DIAGNOSIS — N182 Chronic kidney disease, stage 2 (mild): Secondary | ICD-10-CM | POA: Insufficient documentation

## 2022-04-25 DIAGNOSIS — E860 Dehydration: Secondary | ICD-10-CM

## 2022-04-25 DIAGNOSIS — Z95828 Presence of other vascular implants and grafts: Secondary | ICD-10-CM

## 2022-04-25 DIAGNOSIS — Z5111 Encounter for antineoplastic chemotherapy: Secondary | ICD-10-CM | POA: Diagnosis present

## 2022-04-25 DIAGNOSIS — E1140 Type 2 diabetes mellitus with diabetic neuropathy, unspecified: Secondary | ICD-10-CM | POA: Diagnosis not present

## 2022-04-25 DIAGNOSIS — Z5189 Encounter for other specified aftercare: Secondary | ICD-10-CM | POA: Diagnosis not present

## 2022-04-25 DIAGNOSIS — R978 Other abnormal tumor markers: Secondary | ICD-10-CM | POA: Insufficient documentation

## 2022-04-25 DIAGNOSIS — C7801 Secondary malignant neoplasm of right lung: Secondary | ICD-10-CM | POA: Diagnosis not present

## 2022-04-25 DIAGNOSIS — R918 Other nonspecific abnormal finding of lung field: Secondary | ICD-10-CM | POA: Insufficient documentation

## 2022-04-25 LAB — CBC WITH DIFFERENTIAL (CANCER CENTER ONLY)
Abs Immature Granulocytes: 0.02 10*3/uL (ref 0.00–0.07)
Basophils Absolute: 0 10*3/uL (ref 0.0–0.1)
Basophils Relative: 1 %
Eosinophils Absolute: 0.1 10*3/uL (ref 0.0–0.5)
Eosinophils Relative: 2 %
HCT: 38.6 % — ABNORMAL LOW (ref 39.0–52.0)
Hemoglobin: 13.6 g/dL (ref 13.0–17.0)
Immature Granulocytes: 0 %
Lymphocytes Relative: 19 %
Lymphs Abs: 1.2 10*3/uL (ref 0.7–4.0)
MCH: 33.7 pg (ref 26.0–34.0)
MCHC: 35.2 g/dL (ref 30.0–36.0)
MCV: 95.5 fL (ref 80.0–100.0)
Monocytes Absolute: 0.6 10*3/uL (ref 0.1–1.0)
Monocytes Relative: 10 %
Neutro Abs: 4.3 10*3/uL (ref 1.7–7.7)
Neutrophils Relative %: 68 %
Platelet Count: 97 10*3/uL — ABNORMAL LOW (ref 150–400)
RBC: 4.04 MIL/uL — ABNORMAL LOW (ref 4.22–5.81)
RDW: 12.4 % (ref 11.5–15.5)
WBC Count: 6.2 10*3/uL (ref 4.0–10.5)
nRBC: 0 % (ref 0.0–0.2)

## 2022-04-25 LAB — CMP (CANCER CENTER ONLY)
ALT: 23 U/L (ref 0–44)
AST: 20 U/L (ref 15–41)
Albumin: 4.3 g/dL (ref 3.5–5.0)
Alkaline Phosphatase: 130 U/L — ABNORMAL HIGH (ref 38–126)
Anion gap: 8 (ref 5–15)
BUN: 11 mg/dL (ref 8–23)
CO2: 25 mmol/L (ref 22–32)
Calcium: 9.7 mg/dL (ref 8.9–10.3)
Chloride: 103 mmol/L (ref 98–111)
Creatinine: 0.9 mg/dL (ref 0.61–1.24)
GFR, Estimated: >60 mL/min (ref >60)
Glucose, Bld: 328 mg/dL — ABNORMAL HIGH (ref 70–99)
Potassium: 3.8 mmol/L (ref 3.5–5.1)
Sodium: 136 mmol/L (ref 135–145)
Total Bilirubin: 0.5 mg/dL (ref 0.3–1.2)
Total Protein: 7.1 g/dL (ref 6.5–8.1)

## 2022-04-25 MED ORDER — SODIUM CHLORIDE 0.9% FLUSH
10.0000 mL | Freq: Once | INTRAVENOUS | Status: AC
  Administered 2022-04-25: 10 mL

## 2022-04-25 MED ORDER — DEXTROSE 5 % IV SOLN: Freq: Once | INTRAVENOUS | Status: AC

## 2022-04-25 MED ORDER — SODIUM CHLORIDE 0.9 % IV SOLN
150.0000 mg/m2 | Freq: Once | INTRAVENOUS | Status: AC
  Administered 2022-04-25: 280 mg via INTRAVENOUS
  Filled 2022-04-25: qty 14

## 2022-04-25 MED ORDER — OXALIPLATIN CHEMO INJECTION 100 MG/20ML
40.0000 mg/m2 | Freq: Once | INTRAVENOUS | Status: AC
  Administered 2022-04-25: 75 mg via INTRAVENOUS
  Filled 2022-04-25: qty 15

## 2022-04-25 MED ORDER — SODIUM CHLORIDE 0.9 % IV SOLN
400.0000 mg/m2 | Freq: Once | INTRAVENOUS | Status: AC
  Administered 2022-04-25: 764 mg via INTRAVENOUS
  Filled 2022-04-25: qty 38.2

## 2022-04-25 MED ORDER — SODIUM CHLORIDE 0.9 % IV SOLN
150.0000 mg | Freq: Once | INTRAVENOUS | Status: AC
  Administered 2022-04-25: 150 mg via INTRAVENOUS
  Filled 2022-04-25: qty 150

## 2022-04-25 MED ORDER — SODIUM CHLORIDE 0.9 % IV SOLN
2000.0000 mg/m2 | INTRAVENOUS | Status: DC
  Administered 2022-04-25: 3800 mg via INTRAVENOUS
  Filled 2022-04-25: qty 76

## 2022-04-25 MED ORDER — DEXAMETHASONE SODIUM PHOSPHATE 10 MG/ML IJ SOLN
6.0000 mg | Freq: Once | INTRAMUSCULAR | Status: AC
  Administered 2022-04-25: 6 mg via INTRAVENOUS
  Filled 2022-04-25: qty 1

## 2022-04-25 MED ORDER — SODIUM CHLORIDE 0.9 % IV SOLN: Freq: Once | INTRAVENOUS | Status: AC

## 2022-04-25 MED ORDER — PALONOSETRON HCL INJECTION 0.25 MG/5ML
0.2500 mg | Freq: Once | INTRAVENOUS | Status: AC
  Administered 2022-04-25: 0.25 mg via INTRAVENOUS
  Filled 2022-04-25: qty 5

## 2022-04-25 MED ORDER — PROCHLORPERAZINE MALEATE 10 MG PO TABS: 10.0000 mg | ORAL_TABLET | Freq: Four times a day (QID) | ORAL | 1 refills | Status: DC | PRN

## 2022-04-25 MED ORDER — ATROPINE SULFATE 1 MG/ML IV SOLN
0.5000 mg | Freq: Once | INTRAVENOUS | Status: AC | PRN
  Administered 2022-04-25: 0.5 mg via INTRAVENOUS
  Filled 2022-04-25: qty 1

## 2022-04-25 NOTE — Progress Notes (Signed)
[facility] Follow up:    [name], MD [address] 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[facility]) Staging form: Exocrine Pancreas, AJCC 8th Edition - Clinical stage from 07/26/2021: Stage IV (cT4, cN0, cM1) - Signed by [name], MD on 07/28/2021 Stage prefix: Initial diagnosis Total positive nodes: 0   SUMMARY OF ONCOLOGIC HISTORY: Oncology History Overview Note   Cancer Staging  Pancreatic adenocarcinoma [facility]) Staging form: Exocrine Pancreas, AJCC 8th Edition - Clinical stage from 07/26/2021: Stage IV (cT4, cN0, cM1) - Signed by [name], MD on 07/28/2021    Pancreatic adenocarcinoma ([place])  07/02/2021 Initial Diagnosis   Pancreatic adenocarcinoma ([place])   07/26/2021 Cancer Staging   Staging form: Exocrine Pancreas, AJCC 8th Edition - Clinical stage from 07/26/2021: Stage IV (cT4, cN0, cM1) - Signed by [name], MD on 07/28/2021 Stage prefix: Initial diagnosis Total positive nodes: 0   08/13/2021 -  Chemotherapy   Patient is on Treatment Plan : PANCREAS Modified FOLFIRINOX q14d x 4 cycles      Genetic Testing   [facility] CancerNext-Expanded results (77 genes) were negative. No pathogenic variants were identified. 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 The report date is 08/15/2021.    The CancerNext-Expanded gene panel offered by [facility] and includes sequencing, rearrangement, and RNA analysis for the following 77 genes: AIP, ALK, APC, ATM, AXIN2, BAP1, BARD1, BLM, BMPR1A, BRCA1, BRCA2, BRIP1, CDC73, CDH1, CDK4, CDKN1B, CDKN2A, CHEK2, CTNNA1, DICER1, FANCC, FH, FLCN, GALNT12, KIF1B, LZTR1, MAX, MEN1, MET, MLH1, MSH2, MSH3, MSH6, MUTYH, NBN, NF1, NF2, NTHL1, PALB2, PHOX2B, PMS2, POT1, PRKAR1A, PTCH1, PTEN, RAD51C, RAD51D, RB1, RECQL, RET, SDHA, SDHAF2, SDHB, SDHC, SDHD, SMAD4, SMARCA4, SMARCB1, SMARCE1, STK11, SUFU, TMEM127, TP53, TSC1, TSC2, VHL and XRCC2  (sequencing and deletion/duplication); EGFR, EGLN1, HOXB13, KIT, MITF, PDGFRA, POLD1, and POLE (sequencing only); EPCAM and GREM1 (deletion/duplication only).     10/25/2021 Imaging   EXAM: CT CHEST, ABDOMEN, AND PELVIS WITH CONTRAST  IMPRESSION: 1. Innumerable bilateral pulmonary nodules, many of which are cavitary, consistent with metastatic disease. These nodules show no substantial change in are minimally progressed in the interval. 2. Mix cystic and solid lesion in the head and body of the pancreas is similar to prior and also comparing back to MRI 07/02/2021. 3. Hepatic cysts. 4. 8 mm nonobstructing left renal stone. 5. Aortic Atherosclerosis (ICD10-I70.0).   01/28/2022 Imaging   EXAM: CT CHEST, ABDOMEN, AND PELVIS WITH CONTRAST  IMPRESSION: 1. Innumerable bilateral small solid and cavitary pulmonary nodules, some of the cavitary nodules are slightly less thick-walled when compared with prior exam. 2. Mixed cystic and solid lesion in the head and body of the pancreas is similar to prior exam. 3. Nonobstructing left renal stone. 4.  Aortic Atherosclerosis (ICD10-I70.0).     CURRENT THERAPY: FOLFIRINOX  INTERVAL HISTORY: Zachary Reid 66 y.o. male returns for follow up prior to receiving his palliative chemotherapy every 3 weeks.  He is receiving a reduced dose of Oxaliplatin, due to peripheral neuropathy that is significantly improved from prior.  He has fatigue initially following chemotherapy that improves as the days progressed.  His weight is stable.  He has flank pain controlled with tramadol.  His blood sugars are elevated today at above 300 and his last 2 blood glucose readings here have been above 400.  He struggles with his sugars and eating to maintain his weight.  His is seeing Dr. Loanne Drilling in endocrinology who  is retiring, and is unsure who is taking over his care.  He is currently scheduled to f/u with endocrinology in August.     Patient Active Problem List    Diagnosis Date Noted   Dehydration 01/04/2022   Port-A-Cath in place 09/10/2021   Genetic testing 08/14/2021   Family history of pancreatic cancer 07/30/2021   Family history of ovarian cancer 07/30/2021   Family history of prostate cancer 07/30/2021   Aortic atherosclerosis ([place]) by PET scan on 07/19/2021 07/22/2021   Pulmonary nodules 07/03/2021   Pancreatic adenocarcinoma ([place]) 07/02/2021   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   Labile hypertension 10/13/2018   FH: hypertension 03/19/2018   CKD stage 2 due to type 2 diabetes mellitus ([employer]) 12/17/2017   Diabetes ([place]) 02/02/2016   Vitamin D deficiency 05/24/2014   Testosterone Deficiency    Overweight (BMI 25.0-29.9)    Hyperlipidemia associated with type 2 diabetes mellitus ([place]) 05/29/2011    is allergic to lipitor [atorvastatin].  MEDICAL HISTORY: Past Medical History:  Diagnosis Date   Abnormal EKG    DM (diabetes mellitus) ([place])    Hyperlipidemia    Hypogonadism male    IBS (irritable bowel syndrome)    Obesity    BMI 31   pancreatic ca 06/28/2021   Vitamin D deficiency     SURGICAL HISTORY: Past Surgical History:  Procedure Laterality Date   APPENDECTOMY     BIOPSY  07/26/2021   Procedure: BIOPSY;  Surgeon: Irving Copas., MD;  Location: [place];  Service: Gastroenterology;;   COLONOSCOPY WITH PROPOFOL N/A 07/26/2021   Procedure: COLONOSCOPY WITH PROPOFOL;  Surgeon: Irving Copas., MD;  Location: [facility];  Service: Gastroenterology;  Laterality: N/A;   ESOPHAGOGASTRODUODENOSCOPY (EGD) WITH PROPOFOL N/A 07/26/2021   Procedure: ESOPHAGOGASTRODUODENOSCOPY (EGD) WITH PROPOFOL;  Surgeon: Rush Landmark Telford Nab., MD;  Location: [place];  Service: Gastroenterology;  Laterality: N/A;   EUS N/A 07/26/2021   Procedure: UPPER ENDOSCOPIC ULTRASOUND (EUS) RADIAL;  Surgeon: Irving Copas., MD;  Location: [place];  Service: Gastroenterology;  Laterality: N/A;    FINE NEEDLE ASPIRATION  07/26/2021   Procedure: FINE NEEDLE ASPIRATION (FNA) LINEAR;  Surgeon: Irving Copas., MD;  Location: [facility] [place];  Service: Gastroenterology;;   IR IMAGING GUIDED PORT INSERTION  08/10/2021   POLYPECTOMY  07/26/2021   Procedure: POLYPECTOMY;  Surgeon: Irving Copas., MD;  Location: [facility] ENDOSCOPY;  Service: Gastroenterology;;   SHOULDER SURGERY Right     SOCIAL HISTORY: Social History   Socioeconomic History   Marital status: Married    Spouse name: Not on file   Number of children: 1   Years of education: Not on file   Highest education level: Not on file  Occupational History   Not on file  Tobacco Use   Smoking status: Never   Smokeless tobacco: Never  Substance and Sexual Activity   Alcohol use: No   Drug use: No   Sexual activity: Never  Other Topics Concern   Not on file  Social History Narrative   Not on file   Social Determinants of Health   Financial Resource Strain: Not on file  Food Insecurity: Not on file  Transportation Needs: Not on file  Physical Activity: Not on file  Stress: Not on file  Social Connections: Not on file  Intimate Partner Violence: Not on file    FAMILY HISTORY: Family History  Problem Relation Age of Onset   Pancreatic cancer Mother 64   Prostate cancer Father 74  Prostate   Diabetes Father    Hypertension Father    Drug abuse Brother    Kidney Stones Brother    Ovarian cancer Paternal Grandmother 3   Lung cancer Paternal Grandfather        Cigar smoker   Colon cancer Neg Hx    Colon polyps Neg Hx    Esophageal cancer Neg Hx    Stomach cancer Neg Hx    Rectal cancer Neg Hx     Review of Systems  Constitutional:  Positive for fatigue. Negative for appetite change, chills, fever and unexpected weight change.  HENT:   Negative for hearing loss, lump/mass and trouble swallowing.   Eyes:  Negative for eye problems and icterus.  Respiratory:  Negative for chest tightness, cough and  shortness of breath.   Cardiovascular:  Negative for chest pain, leg swelling and palpitations.  Gastrointestinal:  Positive for diarrhea. Negative for abdominal distention, abdominal pain, constipation, nausea and vomiting.  Endocrine: Negative for hot flashes.  Genitourinary:  Negative for difficulty urinating.   Musculoskeletal:  Negative for arthralgias.  Skin:  Negative for itching and rash.  Neurological:  Positive for numbness. Negative for dizziness, extremity weakness and headaches.  Hematological:  Negative for adenopathy. Does not bruise/bleed easily.  Psychiatric/Behavioral:  Negative for depression. The patient is not nervous/anxious.       PHYSICAL EXAMINATION  ECOG PERFORMANCE STATUS: 1 - Symptomatic but completely ambulatory  Vitals:   04/25/22 0835  BP: (!) 113/57  Pulse: (!) 115  Resp: 18  Temp: 98.5 F (36.9 C)  SpO2: 100%    Physical Exam Constitutional:      General: He is not in acute distress.    Appearance: Normal appearance. He is not toxic-appearing.  HENT:     Head: Normocephalic and atraumatic.  Eyes:     General: No scleral icterus. Cardiovascular:     Rate and Rhythm: Regular rhythm. Tachycardia present.     Pulses: Normal pulses.     Heart sounds: Normal heart sounds.  Pulmonary:     Effort: Pulmonary effort is normal.     Breath sounds: Normal breath sounds.  Abdominal:     General: Abdomen is flat. Bowel sounds are normal. There is no distension.     Palpations: Abdomen is soft.     Tenderness: There is no abdominal tenderness.  Musculoskeletal:        General: No swelling.     Cervical back: Neck supple.  Lymphadenopathy:     Cervical: No cervical adenopathy.  Skin:    General: Skin is warm and dry.     Findings: No rash.  Neurological:     General: No focal deficit present.     Mental Status: He is alert.  Psychiatric:        Mood and Affect: Mood normal.        Behavior: Behavior normal.     LABORATORY DATA:  CBC     Component Value Date/Time   WBC 6.2 04/25/2022 0819   WBC 5.2 06/05/2021 1130   RBC 4.04 (L) 04/25/2022 0819   HGB 13.6 04/25/2022 0819   HCT 38.6 (L) 04/25/2022 0819   PLT 97 (L) 04/25/2022 0819   MCV 95.5 04/25/2022 0819   MCH 33.7 04/25/2022 0819   MCHC 35.2 04/25/2022 0819   RDW 12.4 04/25/2022 0819   LYMPHSABS 1.2 04/25/2022 0819   MONOABS 0.6 04/25/2022 0819   EOSABS 0.1 04/25/2022 0819   BASOSABS 0.0 04/25/2022 0819  CMP     Component Value Date/Time   NA 136 04/25/2022 0819   K 3.8 04/25/2022 0819   CL 103 04/25/2022 0819   CO2 25 04/25/2022 0819   GLUCOSE 328 (H) 04/25/2022 0819   BUN 11 04/25/2022 0819   CREATININE 0.90 04/25/2022 0819   CREATININE 0.88 06/05/2021 1130   CALCIUM 9.7 04/25/2022 0819   PROT 7.1 04/25/2022 0819   ALBUMIN 4.3 04/25/2022 0819   AST 20 04/25/2022 0819   ALT 23 04/25/2022 0819   ALKPHOS 130 (H) 04/25/2022 0819   BILITOT 0.5 04/25/2022 0819   GFRNONAA >60 04/25/2022 0819   GFRNONAA 96 03/05/2021 0915   GFRAA 112 03/05/2021 0915         ASSESSMENT and THERAPY PLAN:   Pancreatic adenocarcinoma ([place]) Zachary Reid is a 66 year old man with stage IV pancreatic adenocarcinoma currently receiving treatment with folfirinox every 3 weeks.    He will proceed with treatment today.  His labs are stable.    Hyperglycemia: We discussed his elevated blood sugars and risks involved with prolonged uncontrolled hyperglycemia including DKA if the sugars go too high and delayed healing from upcoming surgeries.  My nurse is working to get him back in with endocrinology sooner than his august appt.    Diarrhea: This is likely due to the chemotherapy and dietary choices.  This is likely contributing to mild dehydration and subsequent mild tachycardia today.  He will receive IV lfuids and we discussed fluid intake and continuing his management with otc pepto bismol.    Vera will return in 3 weeks for labs, f/u and his next treatment.  In the meantime,  he needs to undergo restaging CT scans.  I asked my nurse to f/u with radiology to get these scheduled.     All questions were answered. The patient knows to call the clinic with any problems, questions or concerns. We can certainly see the patient much sooner if necessary.  Total encounter time: 30 minutes*in face-to-face visit time, chart review, lab review, care coordination, order entry, and documentation of the encounter time.  Wilber Bihari, NP 04/25/22 10:20 AM Medical Oncology and Hematology [facility] [place], [place] [address] Tel. [phone]    Fax. [phone]  *Total Encounter Time as defined by the Centers for Medicare and Medicaid Services includes, in addition to the face-to-face time of a patient visit (documented in the note above) non-face-to-face time: obtaining and reviewing outside history, ordering and reviewing medications, tests or procedures, care coordination (communications with other health care professionals or caregivers) and documentation in the medical record.

## 2022-04-25 NOTE — Assessment & Plan Note (Signed)
Zachary Reid is a 66 year old man with stage IV pancreatic adenocarcinoma currently receiving treatment with folfirinox every 3 weeks.    He will proceed with treatment today.  His labs are stable.    Hyperglycemia: We discussed his elevated blood sugars and risks involved with prolonged uncontrolled hyperglycemia including DKA if the sugars go too high and delayed healing from upcoming surgeries.  My nurse is working to get him back in with endocrinology sooner than his august appt.    Diarrhea: This is likely due to the chemotherapy and dietary choices.  This is likely contributing to mild dehydration and subsequent mild tachycardia today.  He will receive IV lfuids and we discussed fluid intake and continuing his management with otc pepto bismol.    Zachary Reid will return in 3 weeks for labs, f/u and his next treatment.  In the meantime, he needs to undergo restaging CT scans.  I asked my nurse to f/u with radiology to get these scheduled.

## 2022-04-25 NOTE — Patient Instructions (Signed)
[facility] MEDICAL [facility]COL[facility]   Discharge Instructions: Thank you for choosing [facility] to provide your oncology and hematology care.   If you have a lab appointment with the Cancer Center, please go directly to the Cancer Center and check in at the registration area.   Wear comfortable clothing and clothing appropriate for easy access to any Portacath or PICC line.   We strive to give you quality time with your provider. You may need to reschedule your appointment if you arrive late (15 or more minutes).  Arriving late affects you and other patients whose appointments are after yours.  Also, if you miss three or more appointments without notifying the office, you may be dismissed from the clinic at the provider's discretion.      For prescription refill requests, have your pharmacy contact our office and allow 72 hours for refills to be completed.    Today you received the following chemotherapy and/or immunotherapy agents: oxaliplatin, irinotecan, leucovorin and fluorouracil.      To help prevent nausea and vomiting after your treatment, we encourage you to take your nausea medication as directed.  BELOW ARE SYMPTOMS THAT SHOULD BE REPORTED IMMEDIATELY: *FEVER GREATER THAN 100.4 F (38 C) OR HIGHER *CHILLS OR SWEATING *NAUSEA AND VOMITING THAT IS NOT CONTROLLED WITH YOUR NAUSEA MEDICATION *UNUSUAL SHORTNESS OF BREATH *UNUSUAL BRUISING OR BLEEDING *URINARY PROBLEMS (pain or burning when urinating, or frequent urination) *BOWEL PROBLEMS (unusual diarrhea, constipation, pain near the anus) TENDERNESS IN MOUTH AND THROAT WITH OR WITHOUT PRESENCE OF ULCERS (sore throat, sores in mouth, or a toothache) UNUSUAL RASH, SWELLING OR PAIN  UNUSUAL VAGINAL DISCHARGE OR ITCHING   Items with * indicate a potential emergency and should be followed up as soon as possible or go to the Emergency Department if any problems should occur.  Please show the CHEMOTHERAPY ALERT  CARD or IMMUNOTHERAPY ALERT CARD at check-in to the Emergency Department and triage nurse.  Should you have questions after your visit or need to cancel or reschedule your appointment, please contact [facility]  Dept: [phone]  and follow the prompts.  Office hours are 8:00 a.m. to 4:30 p.m. Monday - Friday. Please note that voicemails left after 4:00 p.m. may not be returned until the following business day.  We are closed weekends and major holidays. You have access to a nurse at all times for urgent questions. Please call the main number to the clinic Dept: [phone] and follow the prompts.   For any non-urgent questions, you may also contact your provider using MyChart. We now offer e-Visits for anyone 18 and older to request care online for non-urgent symptoms. For details visit [email or web address].   Also download the MyChart app! Go to the app store, search "MyChart", open the app, select [facility], and log in with your MyChart username and password.  Due to Covid, a mask is required upon entering the hospital/clinic. If you do not have a mask, one will be given to you upon arrival. For doctor visits, patients may have 1 support person aged 18 or older with them. For treatment visits, patients cannot have anyone with them due to current Covid guidelines and our immunocompromised population.   Oxaliplatin Injection What is this medication? OXALIPLATIN (ox AL i PLA tin) is a chemotherapy drug. It targets fast dividing cells, like cancer cells, and causes these cells to die. This medicine is used to treat cancers of the colon and rectum, and many   other cancers. This medicine may be used for other purposes; ask your health care provider or pharmacist if you have questions. COMMON BRAND NAME(S): Eloxatin What should I tell my care team before I take this medication? They need to know if you have any of these conditions: heart disease history of  irregular heartbeat liver disease low blood counts, like white cells, platelets, or red blood cells lung or breathing disease, like asthma take medicines that treat or prevent blood clots tingling of the fingers or toes, or other nerve disorder an unusual or allergic reaction to oxaliplatin, other chemotherapy, other medicines, foods, dyes, or preservatives pregnant or trying to get pregnant breast-feeding How should I use this medication? This drug is given as an infusion into a vein. It is administered in a hospital or clinic by a specially trained health care professional. Talk to your pediatrician regarding the use of this medicine in children. Special care may be needed. Overdosage: If you think you have taken too much of this medicine contact a poison control center or emergency room at once. NOTE: This medicine is only for you. Do not share this medicine with others. What if I miss a dose? It is important not to miss a dose. Call your doctor or health care professional if you are unable to keep an appointment. What may interact with this medication? Do not take this medicine with any of the following medications: cisapride dronedarone pimozide thioridazine This medicine may also interact with the following medications: aspirin and aspirin-like medicines certain medicines that treat or prevent blood clots like warfarin, apixaban, dabigatran, and rivaroxaban cisplatin cyclosporine diuretics medicines for infection like acyclovir, adefovir, amphotericin B, bacitracin, cidofovir, foscarnet, ganciclovir, gentamicin, pentamidine, vancomycin NSAIDs, medicines for pain and inflammation, like ibuprofen or naproxen other medicines that prolong the QT interval (an abnormal heart rhythm) pamidronate zoledronic acid This list may not describe all possible interactions. Give your health care provider a list of all the medicines, herbs, non-prescription drugs, or dietary supplements you use.  Also tell them if you smoke, drink alcohol, or use illegal drugs. Some items may interact with your medicine. What should I watch for while using this medication? Your condition will be monitored carefully while you are receiving this medicine. You may need blood work done while you are taking this medicine. This medicine may make you feel generally unwell. This is not uncommon as chemotherapy can affect healthy cells as well as cancer cells. Report any side effects. Continue your course of treatment even though you feel ill unless your healthcare professional tells you to stop. This medicine can make you more sensitive to cold. Do not drink cold drinks or use ice. Cover exposed skin before coming in contact with cold temperatures or cold objects. When out in cold weather wear warm clothing and cover your mouth and nose to warm the air that goes into your lungs. Tell your doctor if you get sensitive to the cold. Do not become pregnant while taking this medicine or for 9 months after stopping it. Women should inform their health care professional if they wish to become pregnant or think they might be pregnant. Men should not father a child while taking this medicine and for 6 months after stopping it. There is potential for serious side effects to an unborn child. Talk to your health care professional for more information. Do not breast-feed a child while taking this medicine or for 3 months after stopping it. This medicine has caused ovarian failure in some   women. This medicine may make it more difficult to get pregnant. Talk to your health care professional if you are concerned about your fertility. This medicine has caused decreased sperm counts in some men. This may make it more difficult to father a child. Talk to your health care professional if you are concerned about your fertility. This medicine may increase your risk of getting an infection. Call your health care professional for advice if you get  a fever, chills, or sore throat, or other symptoms of a cold or flu. Do not treat yourself. Try to avoid being around people who are sick. Avoid taking medicines that contain aspirin, acetaminophen, ibuprofen, naproxen, or ketoprofen unless instructed by your health care professional. These medicines may hide a fever. Be careful brushing or flossing your teeth or using a toothpick because you may get an infection or bleed more easily. If you have any dental work done, tell your dentist you are receiving this medicine. What side effects may I notice from receiving this medication? Side effects that you should report to your doctor or health care professional as soon as possible: allergic reactions like skin rash, itching or hives, swelling of the face, lips, or tongue breathing problems cough low blood counts - this medicine may decrease the number of white blood cells, red blood cells, and platelets. You may be at increased risk for infections and bleeding nausea, vomiting pain, redness, or irritation at site where injected pain, tingling, numbness in the hands or feet signs and symptoms of bleeding such as bloody or black, tarry stools; red or dark brown urine; spitting up blood or brown material that looks like coffee grounds; red spots on the skin; unusual bruising or bleeding from the eyes, gums, or nose signs and symptoms of a dangerous change in heartbeat or heart rhythm like chest pain; dizziness; fast, irregular heartbeat; palpitations; feeling faint or lightheaded; falls signs and symptoms of infection like fever; chills; cough; sore throat; pain or trouble passing urine signs and symptoms of liver injury like dark yellow or brown urine; general ill feeling or flu-like symptoms; light-colored stools; loss of appetite; nausea; right upper belly pain; unusually weak or tired; yellowing of the eyes or skin signs and symptoms of low red blood cells or anemia such as unusually weak or tired;  feeling faint or lightheaded; falls signs and symptoms of muscle injury like dark urine; trouble passing urine or change in the amount of urine; unusually weak or tired; muscle pain; back pain Side effects that usually do not require medical attention (report to your doctor or health care professional if they continue or are bothersome): changes in taste diarrhea gas hair loss loss of appetite mouth sores This list may not describe all possible side effects. Call your doctor for medical advice about side effects. You may report side effects to FDA at 1-800-FDA-1088. Where should I keep my medication? This drug is given in a hospital or clinic and will not be stored at home. NOTE: This sheet is a summary. It may not cover all possible information. If you have questions about this medicine, talk to your doctor, pharmacist, or health care provider.  2023 Elsevier/Gold Standard (2021-10-05 00:00:00)  Irinotecan injection What is this medication? IRINOTECAN (ir in oh TEE kan ) is a chemotherapy drug. It is used to treat colon and rectal cancer. This medicine may be used for other purposes; ask your health care provider or pharmacist if you have questions. COMMON BRAND NAME(S): Camptosar What should I   tell my care team before I take this medication? They need to know if you have any of these conditions: dehydration diarrhea infection (especially a virus infection such as chickenpox, cold sores, or herpes) liver disease low blood counts, like low white cell, platelet, or red cell counts low levels of calcium, magnesium, or potassium in the blood recent or ongoing radiation therapy an unusual or allergic reaction to irinotecan, other medicines, foods, dyes, or preservatives pregnant or trying to get pregnant breast-feeding How should I use this medication? This drug is given as an infusion into a vein. It is administered in a hospital or clinic by a specially trained health care  professional. Talk to your pediatrician regarding the use of this medicine in children. Special care may be needed. Overdosage: If you think you have taken too much of this medicine contact a poison control center or emergency room at once. NOTE: This medicine is only for you. Do not share this medicine with others. What if I miss a dose? It is important not to miss your dose. Call your doctor or health care professional if you are unable to keep an appointment. What may interact with this medication? Do not take this medicine with any of the following medications: cobicistat itraconazole This medicine may interact with the following medications: antiviral medicines for HIV or AIDS certain antibiotics like rifampin or rifabutin certain medicines for fungal infections like ketoconazole, posaconazole, and voriconazole certain medicines for seizures like carbamazepine, phenobarbital, phenotoin clarithromycin gemfibrozil nefazodone St. John's Wort This list may not describe all possible interactions. Give your health care provider a list of all the medicines, herbs, non-prescription drugs, or dietary supplements you use. Also tell them if you smoke, drink alcohol, or use illegal drugs. Some items may interact with your medicine. What should I watch for while using this medication? Your condition will be monitored carefully while you are receiving this medicine. You will need important blood work done while you are taking this medicine. This drug may make you feel generally unwell. This is not uncommon, as chemotherapy can affect healthy cells as well as cancer cells. Report any side effects. Continue your course of treatment even though you feel ill unless your doctor tells you to stop. In some cases, you may be given additional medicines to help with side effects. Follow all directions for their use. You may get drowsy or dizzy. Do not drive, use machinery, or do anything that needs mental  alertness until you know how this medicine affects you. Do not stand or sit up quickly, especially if you are an older patient. This reduces the risk of dizzy or fainting spells. Call your health care professional for advice if you get a fever, chills, or sore throat, or other symptoms of a cold or flu. Do not treat yourself. This medicine decreases your body's ability to fight infections. Try to avoid being around people who are sick. Avoid taking products that contain aspirin, acetaminophen, ibuprofen, naproxen, or ketoprofen unless instructed by your doctor. These medicines may hide a fever. This medicine may increase your risk to bruise or bleed. Call your doctor or health care professional if you notice any unusual bleeding. Be careful brushing and flossing your teeth or using a toothpick because you may get an infection or bleed more easily. If you have any dental work done, tell your dentist you are receiving this medicine. Do not become pregnant while taking this medicine or for 6 months after stopping it. Women should inform their   health care professional if they wish to become pregnant or think they might be pregnant. Men should not father a child while taking this medicine and for 3 months after stopping it. There is potential for serious side effects to an unborn child. Talk to your health care professional for more information. Do not breast-feed an infant while taking this medicine or for 7 days after stopping it. This medicine has caused ovarian failure in some women. This medicine may make it more difficult to get pregnant. Talk to your health care professional if you are concerned about your fertility. This medicine has caused decreased sperm counts in some men. This may make it more difficult to father a child. Talk to your health care professional if you are concerned about your fertility. What side effects may I notice from receiving this medication? Side effects that you should report  to your doctor or health care professional as soon as possible: allergic reactions like skin rash, itching or hives, swelling of the face, lips, or tongue chest pain diarrhea flushing, runny nose, sweating during infusion low blood counts - this medicine may decrease the number of white blood cells, red blood cells and platelets. You may be at increased risk for infections and bleeding. nausea, vomiting pain, swelling, warmth in the leg signs of decreased platelets or bleeding - bruising, pinpoint red spots on the skin, black, tarry stools, blood in the urine signs of infection - fever or chills, cough, sore throat, pain or difficulty passing urine signs of decreased red blood cells - unusually weak or tired, fainting spells, lightheadedness Side effects that usually do not require medical attention (report to your doctor or health care professional if they continue or are bothersome): constipation hair loss headache loss of appetite mouth sores stomach pain This list may not describe all possible side effects. Call your doctor for medical advice about side effects. You may report side effects to FDA at 1-800-FDA-1088. Where should I keep my medication? This drug is given in a hospital or clinic and will not be stored at home. NOTE: This sheet is a summary. It may not cover all possible information. If you have questions about this medicine, talk to your doctor, pharmacist, or health care provider.  2023 Elsevier/Gold Standard (2021-10-05 00:00:00)  Leucovorin injection What is this medication? LEUCOVORIN (loo koe VOR in) is used to prevent or treat the harmful effects of some medicines. This medicine is used to treat anemia caused by a low amount of folic acid in the body. It is also used with 5-fluorouracil (5-FU) to treat colon cancer. This medicine may be used for other purposes; ask your health care provider or pharmacist if you have questions. What should I tell my care team before  I take this medication? They need to know if you have any of these conditions: anemia from low levels of vitamin B-12 in the blood an unusual or allergic reaction to leucovorin, folic acid, other medicines, foods, dyes, or preservatives pregnant or trying to get pregnant breast-feeding How should I use this medication? This medicine is for injection into a muscle or into a vein. It is given by a health care professional in a hospital or clinic setting. Talk to your pediatrician regarding the use of this medicine in children. Special care may be needed. Overdosage: If you think you have taken too much of this medicine contact a poison control center or emergency room at once. NOTE: This medicine is only for you. Do not share this   medicine with others. What if I miss a dose? This does not apply. What may interact with this medication? capecitabine fluorouracil phenobarbital phenytoin primidone trimethoprim-sulfamethoxazole This list may not describe all possible interactions. Give your health care provider a list of all the medicines, herbs, non-prescription drugs, or dietary supplements you use. Also tell them if you smoke, drink alcohol, or use illegal drugs. Some items may interact with your medicine. What should I watch for while using this medication? Your condition will be monitored carefully while you are receiving this medicine. This medicine may increase the side effects of 5-fluorouracil, 5-FU. Tell your doctor or health care professional if you have diarrhea or mouth sores that do not get better or that get worse. What side effects may I notice from receiving this medication? Side effects that you should report to your doctor or health care professional as soon as possible: allergic reactions like skin rash, itching or hives, swelling of the face, lips, or tongue breathing problems fever, infection mouth sores unusual bleeding or bruising unusually weak or tired Side effects  that usually do not require medical attention (report to your doctor or health care professional if they continue or are bothersome): constipation or diarrhea loss of appetite nausea, vomiting This list may not describe all possible side effects. Call your doctor for medical advice about side effects. You may report side effects to FDA at 1-800-FDA-1088. Where should I keep my medication? This drug is given in a hospital or clinic and will not be stored at home. NOTE: This sheet is a summary. It may not cover all possible information. If you have questions about this medicine, talk to your doctor, pharmacist, or health care provider.  2023 Elsevier/Gold Standard (2008-05-12 00:00:00)  Fluorouracil, 5-FU injection What is this medication? FLUOROURACIL, 5-FU (flure oh YOOR a sil) is a chemotherapy drug. It slows the growth of cancer cells. This medicine is used to treat many types of cancer like breast cancer, colon or rectal cancer, pancreatic cancer, and stomach cancer. This medicine may be used for other purposes; ask your health care provider or pharmacist if you have questions. COMMON BRAND NAME(S): Adrucil What should I tell my care team before I take this medication? They need to know if you have any of these conditions: blood disorders dihydropyrimidine dehydrogenase (DPD) deficiency infection (especially a virus infection such as chickenpox, cold sores, or herpes) kidney disease liver disease malnourished, poor nutrition recent or ongoing radiation therapy an unusual or allergic reaction to fluorouracil, other chemotherapy, other medicines, foods, dyes, or preservatives pregnant or trying to get pregnant breast-feeding How should I use this medication? This drug is given as an infusion or injection into a vein. It is administered in a hospital or clinic by a specially trained health care professional. Talk to your pediatrician regarding the use of this medicine in children.  Special care may be needed. Overdosage: If you think you have taken too much of this medicine contact a poison control center or emergency room at once. NOTE: This medicine is only for you. Do not share this medicine with others. What if I miss a dose? It is important not to miss your dose. Call your doctor or health care professional if you are unable to keep an appointment. What may interact with this medication? Do not take this medicine with any of the following medications: live virus vaccines This medicine may also interact with the following medications: medicines that treat or prevent blood clots like warfarin, enoxaparin, and dalteparin   This list may not describe all possible interactions. Give your health care provider a list of all the medicines, herbs, non-prescription drugs, or dietary supplements you use. Also tell them if you smoke, drink alcohol, or use illegal drugs. Some items may interact with your medicine. What should I watch for while using this medication? Visit your doctor for checks on your progress. This drug may make you feel generally unwell. This is not uncommon, as chemotherapy can affect healthy cells as well as cancer cells. Report any side effects. Continue your course of treatment even though you feel ill unless your doctor tells you to stop. In some cases, you may be given additional medicines to help with side effects. Follow all directions for their use. Call your doctor or health care professional for advice if you get a fever, chills or sore throat, or other symptoms of a cold or flu. Do not treat yourself. This drug decreases your body's ability to fight infections. Try to avoid being around people who are sick. This medicine may increase your risk to bruise or bleed. Call your doctor or health care professional if you notice any unusual bleeding. Be careful brushing and flossing your teeth or using a toothpick because you may get an infection or bleed more  easily. If you have any dental work done, tell your dentist you are receiving this medicine. Avoid taking products that contain aspirin, acetaminophen, ibuprofen, naproxen, or ketoprofen unless instructed by your doctor. These medicines may hide a fever. Do not become pregnant while taking this medicine. Women should inform their doctor if they wish to become pregnant or think they might be pregnant. There is a potential for serious side effects to an unborn child. Talk to your health care professional or pharmacist for more information. Do not breast-feed an infant while taking this medicine. Men should inform their doctor if they wish to father a child. This medicine may lower sperm counts. Do not treat diarrhea with over the counter products. Contact your doctor if you have diarrhea that lasts more than 2 days or if it is severe and watery. This medicine can make you more sensitive to the sun. Keep out of the sun. If you cannot avoid being in the sun, wear protective clothing and use sunscreen. Do not use sun lamps or tanning beds/booths. What side effects may I notice from receiving this medication? Side effects that you should report to your doctor or health care professional as soon as possible: allergic reactions like skin rash, itching or hives, swelling of the face, lips, or tongue low blood counts - this medicine may decrease the number of white blood cells, red blood cells and platelets. You may be at increased risk for infections and bleeding. signs of infection - fever or chills, cough, sore throat, pain or difficulty passing urine signs of decreased platelets or bleeding - bruising, pinpoint red spots on the skin, black, tarry stools, blood in the urine signs of decreased red blood cells - unusually weak or tired, fainting spells, lightheadedness breathing problems changes in vision chest pain mouth sores nausea and vomiting pain, swelling, redness at site where injected pain,  tingling, numbness in the hands or feet redness, swelling, or sores on hands or feet stomach pain unusual bleeding Side effects that usually do not require medical attention (report to your doctor or health care professional if they continue or are bothersome): changes in finger or toe nails diarrhea dry or itchy skin hair loss headache loss of appetite sensitivity   of eyes to the light stomach upset unusually teary eyes This list may not describe all possible side effects. Call your doctor for medical advice about side effects. You may report side effects to FDA at 1-800-FDA-1088. Where should I keep my medication? This drug is given in a hospital or clinic and will not be stored at home. NOTE: This sheet is a summary. It may not cover all possible information. If you have questions about this medicine, talk to your doctor, pharmacist, or health care provider.  2023 [employer] (2021-10-05 00:00:00) 

## 2022-04-25 NOTE — Progress Notes (Signed)
Ok to treat with plt count 77 per Suzan Garibaldi NP.

## 2022-04-26 LAB — CANCER ANTIGEN 19-9: CA 19-9: 141 U/mL — ABNORMAL HIGH (ref 0–35)

## 2022-04-27 ENCOUNTER — Inpatient Hospital Stay: Payer: Medicare Other

## 2022-04-27 VITALS — BP 104/69 | HR 67 | Temp 97.9°F | Resp 20

## 2022-04-27 DIAGNOSIS — C259 Malignant neoplasm of pancreas, unspecified: Secondary | ICD-10-CM

## 2022-04-27 DIAGNOSIS — Z5111 Encounter for antineoplastic chemotherapy: Secondary | ICD-10-CM | POA: Diagnosis not present

## 2022-04-27 MED ORDER — SODIUM CHLORIDE 0.9 % IV SOLN: INTRAVENOUS | Status: AC

## 2022-04-27 MED ORDER — SODIUM CHLORIDE 0.9% FLUSH
10.0000 mL | INTRAVENOUS | Status: DC | PRN
  Administered 2022-04-27: 10 mL

## 2022-04-27 MED ORDER — PEGFILGRASTIM-CBQV 6 MG/0.6ML ~~LOC~~ SOSY
6.0000 mg | PREFILLED_SYRINGE | Freq: Once | SUBCUTANEOUS | Status: AC
  Administered 2022-04-27: 6 mg via SUBCUTANEOUS
  Filled 2022-04-27: qty 0.6

## 2022-04-27 NOTE — Patient Instructions (Signed)

## 2022-05-08 ENCOUNTER — Other Ambulatory Visit: Payer: Self-pay

## 2022-05-08 ENCOUNTER — Other Ambulatory Visit: Payer: Self-pay | Admitting: Hematology

## 2022-05-08 MED ORDER — ZOLPIDEM TARTRATE 10 MG PO TABS: 10.0000 mg | ORAL_TABLET | Freq: Every evening | ORAL | 0 refills | Status: DC | PRN

## 2022-05-14 ENCOUNTER — Other Ambulatory Visit: Payer: Self-pay

## 2022-05-14 ENCOUNTER — Ambulatory Visit (HOSPITAL_COMMUNITY)
Admission: RE | Admit: 2022-05-14 | Discharge: 2022-05-14 | Disposition: A | Discharge status: Home / Self Care | Payer: Medicare Other | Source: Ambulatory Visit | Attending: Hematology | Admitting: Hematology

## 2022-05-14 ENCOUNTER — Encounter (HOSPITAL_COMMUNITY): Payer: Self-pay

## 2022-05-14 ENCOUNTER — Inpatient Hospital Stay: Payer: Medicare Other

## 2022-05-14 DIAGNOSIS — Z95828 Presence of other vascular implants and grafts: Secondary | ICD-10-CM

## 2022-05-14 DIAGNOSIS — E860 Dehydration: Secondary | ICD-10-CM

## 2022-05-14 DIAGNOSIS — C259 Malignant neoplasm of pancreas, unspecified: Secondary | ICD-10-CM

## 2022-05-14 LAB — CBC WITH DIFFERENTIAL (CANCER CENTER ONLY)
Abs Immature Granulocytes: 0.03 10*3/uL (ref 0.00–0.07)
Basophils Absolute: 0.1 10*3/uL (ref 0.0–0.1)
Basophils Relative: 1 %
Eosinophils Absolute: 0.3 10*3/uL (ref 0.0–0.5)
Eosinophils Relative: 4 %
HCT: 35.2 % — ABNORMAL LOW (ref 39.0–52.0)
Hemoglobin: 12.5 g/dL — ABNORMAL LOW (ref 13.0–17.0)
Immature Granulocytes: 0 %
Lymphocytes Relative: 17 %
Lymphs Abs: 1.2 10*3/uL (ref 0.7–4.0)
MCH: 34.3 pg — ABNORMAL HIGH (ref 26.0–34.0)
MCHC: 35.5 g/dL (ref 30.0–36.0)
MCV: 96.7 fL (ref 80.0–100.0)
Monocytes Absolute: 0.5 10*3/uL (ref 0.1–1.0)
Monocytes Relative: 8 %
Neutro Abs: 5.1 10*3/uL (ref 1.7–7.7)
Neutrophils Relative %: 70 %
Platelet Count: 97 10*3/uL — ABNORMAL LOW (ref 150–400)
RBC: 3.64 MIL/uL — ABNORMAL LOW (ref 4.22–5.81)
RDW: 12.8 % (ref 11.5–15.5)
WBC Count: 7.1 10*3/uL (ref 4.0–10.5)
nRBC: 0 % (ref 0.0–0.2)

## 2022-05-14 LAB — CMP (CANCER CENTER ONLY)
ALT: 19 U/L (ref 0–44)
AST: 17 U/L (ref 15–41)
Albumin: 4 g/dL (ref 3.5–5.0)
Alkaline Phosphatase: 128 U/L — ABNORMAL HIGH (ref 38–126)
Anion gap: 5 (ref 5–15)
BUN: 15 mg/dL (ref 8–23)
CO2: 27 mmol/L (ref 22–32)
Calcium: 9.5 mg/dL (ref 8.9–10.3)
Chloride: 102 mmol/L (ref 98–111)
Creatinine: 0.96 mg/dL (ref 0.61–1.24)
GFR, Estimated: >60 mL/min (ref >60)
Glucose, Bld: 329 mg/dL — ABNORMAL HIGH (ref 70–99)
Potassium: 4.2 mmol/L (ref 3.5–5.1)
Sodium: 134 mmol/L — ABNORMAL LOW (ref 135–145)
Total Bilirubin: 0.6 mg/dL (ref 0.3–1.2)
Total Protein: 6.6 g/dL (ref 6.5–8.1)

## 2022-05-14 MED ORDER — SODIUM CHLORIDE 0.9% FLUSH
10.0000 mL | Freq: Once | INTRAVENOUS | Status: AC
  Administered 2022-05-14: 10 mL

## 2022-05-14 MED ORDER — SODIUM CHLORIDE (PF) 0.9 % IJ SOLN
INTRAMUSCULAR | Status: AC
  Filled 2022-05-14: qty 50

## 2022-05-14 MED ORDER — HEPARIN SOD (PORK) LOCK FLUSH 100 UNIT/ML IV SOLN
500.0000 [IU] | Freq: Once | INTRAVENOUS | Status: AC
  Administered 2022-05-14: 500 [IU] via INTRAVENOUS

## 2022-05-14 MED ORDER — IOHEXOL 300 MG/ML  SOLN
100.0000 mL | Freq: Once | INTRAMUSCULAR | Status: AC | PRN
  Administered 2022-05-14: 100 mL via INTRAVENOUS

## 2022-05-15 ENCOUNTER — Other Ambulatory Visit: Payer: Self-pay | Admitting: Hematology

## 2022-05-15 DIAGNOSIS — C259 Malignant neoplasm of pancreas, unspecified: Secondary | ICD-10-CM

## 2022-05-15 LAB — CANCER ANTIGEN 19-9: CA 19-9: 140 U/mL — ABNORMAL HIGH (ref 0–35)

## 2022-05-15 MED FILL — Fosaprepitant Dimeglumine For IV Infusion 150 MG (Base Eq): INTRAVENOUS | Qty: 5 | Status: AC

## 2022-05-16 ENCOUNTER — Encounter: Payer: Self-pay | Admitting: Hematology

## 2022-05-16 ENCOUNTER — Other Ambulatory Visit: Payer: Self-pay

## 2022-05-16 ENCOUNTER — Inpatient Hospital Stay: Payer: Medicare Other

## 2022-05-16 ENCOUNTER — Inpatient Hospital Stay (HOSPITAL_BASED_OUTPATIENT_CLINIC_OR_DEPARTMENT_OTHER): Payer: Medicare Other | Admitting: Hematology

## 2022-05-16 VITALS — BP 125/74 | HR 91 | Temp 97.9°F | Resp 18 | Ht 71.0 in | Wt 165.2 lb

## 2022-05-16 DIAGNOSIS — C259 Malignant neoplasm of pancreas, unspecified: Secondary | ICD-10-CM

## 2022-05-16 DIAGNOSIS — Z5111 Encounter for antineoplastic chemotherapy: Secondary | ICD-10-CM | POA: Diagnosis not present

## 2022-05-16 MED ORDER — SODIUM CHLORIDE 0.9 % IV SOLN
150.0000 mg | Freq: Once | INTRAVENOUS | Status: AC
  Administered 2022-05-16: 150 mg via INTRAVENOUS
  Filled 2022-05-16: qty 150

## 2022-05-16 MED ORDER — PALONOSETRON HCL INJECTION 0.25 MG/5ML
0.2500 mg | Freq: Once | INTRAVENOUS | Status: AC
  Administered 2022-05-16: 0.25 mg via INTRAVENOUS
  Filled 2022-05-16: qty 5

## 2022-05-16 MED ORDER — OXALIPLATIN CHEMO INJECTION 100 MG/20ML
30.0000 mg/m2 | Freq: Once | INTRAVENOUS | Status: AC
  Administered 2022-05-16: 55 mg via INTRAVENOUS
  Filled 2022-05-16: qty 11

## 2022-05-16 MED ORDER — DEXTROSE 5 % IV SOLN: Freq: Once | INTRAVENOUS | Status: AC

## 2022-05-16 MED ORDER — SODIUM CHLORIDE 0.9 % IV SOLN
2000.0000 mg/m2 | INTRAVENOUS | Status: DC
  Administered 2022-05-16: 3800 mg via INTRAVENOUS
  Filled 2022-05-16: qty 76

## 2022-05-16 MED ORDER — SODIUM CHLORIDE 0.9% FLUSH: 10.0000 mL | INTRAVENOUS | Status: DC | PRN

## 2022-05-16 MED ORDER — HEPARIN SOD (PORK) LOCK FLUSH 100 UNIT/ML IV SOLN: 500.0000 [IU] | Freq: Once | INTRAVENOUS | Status: DC | PRN

## 2022-05-16 MED ORDER — DEXAMETHASONE SODIUM PHOSPHATE 10 MG/ML IJ SOLN
6.0000 mg | Freq: Once | INTRAMUSCULAR | Status: AC
  Administered 2022-05-16: 6 mg via INTRAVENOUS
  Filled 2022-05-16: qty 1

## 2022-05-16 MED ORDER — TRAMADOL HCL 50 MG PO TABS
50.0000 mg | ORAL_TABLET | Freq: Four times a day (QID) | ORAL | 0 refills | Status: DC | PRN
Start: 2022-05-16 — End: 2022-06-13

## 2022-05-16 MED ORDER — ATROPINE SULFATE 1 MG/ML IV SOLN
0.5000 mg | Freq: Once | INTRAVENOUS | Status: AC
  Administered 2022-05-16: 0.5 mg via INTRAVENOUS
  Filled 2022-05-16: qty 1

## 2022-05-16 MED ORDER — SODIUM CHLORIDE 0.9 % IV SOLN
400.0000 mg/m2 | Freq: Once | INTRAVENOUS | Status: AC
  Administered 2022-05-16: 764 mg via INTRAVENOUS
  Filled 2022-05-16: qty 38.2

## 2022-05-16 MED ORDER — SODIUM CHLORIDE 0.9 % IV SOLN
150.0000 mg/m2 | Freq: Once | INTRAVENOUS | Status: AC
  Administered 2022-05-16: 280 mg via INTRAVENOUS
  Filled 2022-05-16: qty 14

## 2022-05-16 MED ORDER — ALPRAZOLAM 0.25 MG PO TABS: 0.2500 mg | ORAL_TABLET | Freq: Two times a day (BID) | ORAL | 0 refills | Status: DC | PRN

## 2022-05-16 NOTE — Patient Instructions (Signed)
[facility]   Discharge Instructions: Thank you for choosing [facility] to provide your oncology and hematology care.   If you have a lab appointment with the [facility], please go directly to the [facility] and check in at the registration area.   Wear comfortable clothing and clothing appropriate for easy access to any Portacath or PICC line.   We strive to give you quality time with your provider. You may need to reschedule your appointment if you arrive late (15 or more minutes).  Arriving late affects you and other patients whose appointments are after yours.  Also, if you miss three or more appointments without notifying the office, you may be dismissed from the clinic at the provider's discretion.      For prescription refill requests, have your pharmacy contact our office and allow 72 hours for refills to be completed.    Today you received the following chemotherapy and/or immunotherapy agents: oxaliplatin, irinotecan, leucovorin and fluorouracil.      To help prevent nausea and vomiting after your treatment, we encourage you to take your nausea medication as directed.  BELOW ARE SYMPTOMS THAT SHOULD BE REPORTED IMMEDIATELY: *FEVER GREATER THAN 100.4 F (38 C) OR HIGHER *CHILLS OR SWEATING *NAUSEA AND VOMITING THAT IS NOT CONTROLLED WITH YOUR NAUSEA MEDICATION *UNUSUAL SHORTNESS OF BREATH *UNUSUAL BRUISING OR BLEEDING *URINARY PROBLEMS (pain or burning when urinating, or frequent urination) *BOWEL PROBLEMS (unusual diarrhea, constipation, pain near the anus) TENDERNESS IN MOUTH AND THROAT WITH OR WITHOUT PRESENCE OF ULCERS (sore throat, sores in mouth, or a toothache) UNUSUAL RASH, SWELLING OR PAIN  UNUSUAL VAGINAL DISCHARGE OR ITCHING   Items with * indicate a potential emergency and should be followed up as soon as possible or go to the Emergency Department if any problems should occur.  Please show the CHEMOTHERAPY ALERT  CARD or IMMUNOTHERAPY ALERT CARD at check-in to the Emergency Department and triage nurse.  Should you have questions after your visit or need to cancel or reschedule your appointment, please contact [facility]  Dept: [phone]  and follow the prompts.  Office hours are 8:00 a.m. to 4:30 p.m. Monday - Friday. Please note that voicemails left after 4:00 p.m. may not be returned until the following business day.  We are closed weekends and major holidays. You have access to a nurse at all times for urgent questions. Please call the main number to the clinic Dept: [phone] and follow the prompts.   For any non-urgent questions, you may also contact your provider using MyChart. We now offer e-Visits for anyone 29 and older to request care online for non-urgent symptoms. For details visit [email or web address].   Also download the MyChart app! Go to the app store, search "MyChart", open the app, select [facility], and log in with your MyChart username and password.  Due to Covid, a mask is required upon entering the hospital/clinic. If you do not have a mask, one will be given to you upon arrival. For doctor visits, patients may have 1 support person aged 60 or older with them. For treatment visits, patients cannot have anyone with them due to current Covid guidelines and our immunocompromised population.   Oxaliplatin Injection What is this medication? OXALIPLATIN (ox AL i PLA tin) is a chemotherapy drug. It targets fast dividing cells, like cancer cells, and causes these cells to die. This medicine is used to treat cancers of the colon and rectum, and many  other cancers. This medicine may be used for other purposes; ask your health care provider or pharmacist if you have questions. COMMON BRAND NAME(S): Eloxatin What should I tell my care team before I take this medication? They need to know if you have any of these conditions: heart disease history of  irregular heartbeat liver disease low blood counts, like white cells, platelets, or red blood cells lung or breathing disease, like asthma take medicines that treat or prevent blood clots tingling of the fingers or toes, or other nerve disorder an unusual or allergic reaction to oxaliplatin, other chemotherapy, other medicines, foods, dyes, or preservatives pregnant or trying to get pregnant breast-feeding How should I use this medication? This drug is given as an infusion into a vein. It is administered in a hospital or clinic by a specially trained health care professional. Talk to your pediatrician regarding the use of this medicine in children. Special care may be needed. Overdosage: If you think you have taken too much of this medicine contact a poison control center or emergency room at once. NOTE: This medicine is only for you. Do not share this medicine with others. What if I miss a dose? It is important not to miss a dose. Call your doctor or health care professional if you are unable to keep an appointment. What may interact with this medication? Do not take this medicine with any of the following medications: cisapride dronedarone pimozide thioridazine This medicine may also interact with the following medications: aspirin and aspirin-like medicines certain medicines that treat or prevent blood clots like warfarin, apixaban, dabigatran, and rivaroxaban cisplatin cyclosporine diuretics medicines for infection like acyclovir, adefovir, amphotericin B, bacitracin, cidofovir, foscarnet, ganciclovir, gentamicin, pentamidine, vancomycin NSAIDs, medicines for pain and inflammation, like ibuprofen or naproxen other medicines that prolong the QT interval (an abnormal heart rhythm) pamidronate zoledronic acid This list may not describe all possible interactions. Give your health care provider a list of all the medicines, herbs, non-prescription drugs, or dietary supplements you use.  Also tell them if you smoke, drink alcohol, or use illegal drugs. Some items may interact with your medicine. What should I watch for while using this medication? Your condition will be monitored carefully while you are receiving this medicine. You may need blood work done while you are taking this medicine. This medicine may make you feel generally unwell. This is not uncommon as chemotherapy can affect healthy cells as well as cancer cells. Report any side effects. Continue your course of treatment even though you feel ill unless your healthcare professional tells you to stop. This medicine can make you more sensitive to cold. Do not drink cold drinks or use ice. Cover exposed skin before coming in contact with cold temperatures or cold objects. When out in cold weather wear warm clothing and cover your mouth and nose to warm the air that goes into your lungs. Tell your doctor if you get sensitive to the cold. Do not become pregnant while taking this medicine or for 9 months after stopping it. Women should inform their health care professional if they wish to become pregnant or think they might be pregnant. Men should not father a child while taking this medicine and for 6 months after stopping it. There is potential for serious side effects to an unborn child. Talk to your health care professional for more information. Do not breast-feed a child while taking this medicine or for 3 months after stopping it. This medicine has caused ovarian failure in some  women. This medicine may make it more difficult to get pregnant. Talk to your health care professional if you are concerned about your fertility. This medicine has caused decreased sperm counts in some men. This may make it more difficult to father a child. Talk to your health care professional if you are concerned about your fertility. This medicine may increase your risk of getting an infection. Call your health care professional for advice if you get  a fever, chills, or sore throat, or other symptoms of a cold or flu. Do not treat yourself. Try to avoid being around people who are sick. Avoid taking medicines that contain aspirin, acetaminophen, ibuprofen, naproxen, or ketoprofen unless instructed by your health care professional. These medicines may hide a fever. Be careful brushing or flossing your teeth or using a toothpick because you may get an infection or bleed more easily. If you have any dental work done, tell your dentist you are receiving this medicine. What side effects may I notice from receiving this medication? Side effects that you should report to your doctor or health care professional as soon as possible: allergic reactions like skin rash, itching or hives, swelling of the face, lips, or tongue breathing problems cough low blood counts - this medicine may decrease the number of white blood cells, red blood cells, and platelets. You may be at increased risk for infections and bleeding nausea, vomiting pain, redness, or irritation at site where injected pain, tingling, numbness in the hands or feet signs and symptoms of bleeding such as bloody or black, tarry stools; red or dark brown urine; spitting up blood or brown material that looks like coffee grounds; red spots on the skin; unusual bruising or bleeding from the eyes, gums, or nose signs and symptoms of a dangerous change in heartbeat or heart rhythm like chest pain; dizziness; fast, irregular heartbeat; palpitations; feeling faint or lightheaded; falls signs and symptoms of infection like fever; chills; cough; sore throat; pain or trouble passing urine signs and symptoms of liver injury like dark yellow or brown urine; general ill feeling or flu-like symptoms; light-colored stools; loss of appetite; nausea; right upper belly pain; unusually weak or tired; yellowing of the eyes or skin signs and symptoms of low red blood cells or anemia such as unusually weak or tired;  feeling faint or lightheaded; falls signs and symptoms of muscle injury like dark urine; trouble passing urine or change in the amount of urine; unusually weak or tired; muscle pain; back pain Side effects that usually do not require medical attention (report to your doctor or health care professional if they continue or are bothersome): changes in taste diarrhea gas hair loss loss of appetite mouth sores This list may not describe all possible side effects. Call your doctor for medical advice about side effects. You may report side effects to FDA at 1-800-FDA-1088. Where should I keep my medication? This drug is given in a hospital or clinic and will not be stored at home. NOTE: This sheet is a summary. It may not cover all possible information. If you have questions about this medicine, talk to your doctor, pharmacist, or health care provider.  2023 Elsevier/Gold Standard (2021-10-05 00:00:00)  Irinotecan injection What is this medication? IRINOTECAN (ir in oh TEE kan ) is a chemotherapy drug. It is used to treat colon and rectal cancer. This medicine may be used for other purposes; ask your health care provider or pharmacist if you have questions. COMMON BRAND NAME(S): Camptosar What should I  tell my care team before I take this medication? They need to know if you have any of these conditions: dehydration diarrhea infection (especially a virus infection such as chickenpox, cold sores, or herpes) liver disease low blood counts, like low white cell, platelet, or red cell counts low levels of calcium, magnesium, or potassium in the blood recent or ongoing radiation therapy an unusual or allergic reaction to irinotecan, other medicines, foods, dyes, or preservatives pregnant or trying to get pregnant breast-feeding How should I use this medication? This drug is given as an infusion into a vein. It is administered in a hospital or clinic by a specially trained health care  professional. Talk to your pediatrician regarding the use of this medicine in children. Special care may be needed. Overdosage: If you think you have taken too much of this medicine contact a poison control center or emergency room at once. NOTE: This medicine is only for you. Do not share this medicine with others. What if I miss a dose? It is important not to miss your dose. Call your doctor or health care professional if you are unable to keep an appointment. What may interact with this medication? Do not take this medicine with any of the following medications: cobicistat itraconazole This medicine may interact with the following medications: antiviral medicines for HIV or AIDS certain antibiotics like rifampin or rifabutin certain medicines for fungal infections like ketoconazole, posaconazole, and voriconazole certain medicines for seizures like carbamazepine, phenobarbital, phenotoin clarithromycin gemfibrozil nefazodone St. John's Wort This list may not describe all possible interactions. Give your health care provider a list of all the medicines, herbs, non-prescription drugs, or dietary supplements you use. Also tell them if you smoke, drink alcohol, or use illegal drugs. Some items may interact with your medicine. What should I watch for while using this medication? Your condition will be monitored carefully while you are receiving this medicine. You will need important blood work done while you are taking this medicine. This drug may make you feel generally unwell. This is not uncommon, as chemotherapy can affect healthy cells as well as cancer cells. Report any side effects. Continue your course of treatment even though you feel ill unless your doctor tells you to stop. In some cases, you may be given additional medicines to help with side effects. Follow all directions for their use. You may get drowsy or dizzy. Do not drive, use machinery, or do anything that needs mental  alertness until you know how this medicine affects you. Do not stand or sit up quickly, especially if you are an older patient. This reduces the risk of dizzy or fainting spells. Call your health care professional for advice if you get a fever, chills, or sore throat, or other symptoms of a cold or flu. Do not treat yourself. This medicine decreases your body's ability to fight infections. Try to avoid being around people who are sick. Avoid taking products that contain aspirin, acetaminophen, ibuprofen, naproxen, or ketoprofen unless instructed by your doctor. These medicines may hide a fever. This medicine may increase your risk to bruise or bleed. Call your doctor or health care professional if you notice any unusual bleeding. Be careful brushing and flossing your teeth or using a toothpick because you may get an infection or bleed more easily. If you have any dental work done, tell your dentist you are receiving this medicine. Do not become pregnant while taking this medicine or for 6 months after stopping it. Women should inform their  health care professional if they wish to become pregnant or think they might be pregnant. Men should not father a child while taking this medicine and for 3 months after stopping it. There is potential for serious side effects to an unborn child. Talk to your health care professional for more information. Do not breast-feed an infant while taking this medicine or for 7 days after stopping it. This medicine has caused ovarian failure in some women. This medicine may make it more difficult to get pregnant. Talk to your health care professional if you are concerned about your fertility. This medicine has caused decreased sperm counts in some men. This may make it more difficult to father a child. Talk to your health care professional if you are concerned about your fertility. What side effects may I notice from receiving this medication? Side effects that you should report  to your doctor or health care professional as soon as possible: allergic reactions like skin rash, itching or hives, swelling of the face, lips, or tongue chest pain diarrhea flushing, runny nose, sweating during infusion low blood counts - this medicine may decrease the number of white blood cells, red blood cells and platelets. You may be at increased risk for infections and bleeding. nausea, vomiting pain, swelling, warmth in the leg signs of decreased platelets or bleeding - bruising, pinpoint red spots on the skin, black, tarry stools, blood in the urine signs of infection - fever or chills, cough, sore throat, pain or difficulty passing urine signs of decreased red blood cells - unusually weak or tired, fainting spells, lightheadedness Side effects that usually do not require medical attention (report to your doctor or health care professional if they continue or are bothersome): constipation hair loss headache loss of appetite mouth sores stomach pain This list may not describe all possible side effects. Call your doctor for medical advice about side effects. You may report side effects to FDA at 1-800-FDA-1088. Where should I keep my medication? This drug is given in a hospital or clinic and will not be stored at home. NOTE: This sheet is a summary. It may not cover all possible information. If you have questions about this medicine, talk to your doctor, pharmacist, or health care provider.  2023 Elsevier/Gold Standard (2021-10-05 00:00:00)  Leucovorin injection What is this medication? LEUCOVORIN (loo koe VOR in) is used to prevent or treat the harmful effects of some medicines. This medicine is used to treat anemia caused by a low amount of folic acid in the body. It is also used with 5-fluorouracil (5-FU) to treat colon cancer. This medicine may be used for other purposes; ask your health care provider or pharmacist if you have questions. What should I tell my care team before  I take this medication? They need to know if you have any of these conditions: anemia from low levels of vitamin B-12 in the blood an unusual or allergic reaction to leucovorin, folic acid, other medicines, foods, dyes, or preservatives pregnant or trying to get pregnant breast-feeding How should I use this medication? This medicine is for injection into a muscle or into a vein. It is given by a health care professional in a hospital or clinic setting. Talk to your pediatrician regarding the use of this medicine in children. Special care may be needed. Overdosage: If you think you have taken too much of this medicine contact a poison control center or emergency room at once. NOTE: This medicine is only for you. Do not share this  medicine with others. What if I miss a dose? This does not apply. What may interact with this medication? capecitabine fluorouracil phenobarbital phenytoin primidone trimethoprim-sulfamethoxazole This list may not describe all possible interactions. Give your health care provider a list of all the medicines, herbs, non-prescription drugs, or dietary supplements you use. Also tell them if you smoke, drink alcohol, or use illegal drugs. Some items may interact with your medicine. What should I watch for while using this medication? Your condition will be monitored carefully while you are receiving this medicine. This medicine may increase the side effects of 5-fluorouracil, 5-FU. Tell your doctor or health care professional if you have diarrhea or mouth sores that do not get better or that get worse. What side effects may I notice from receiving this medication? Side effects that you should report to your doctor or health care professional as soon as possible: allergic reactions like skin rash, itching or hives, swelling of the face, lips, or tongue breathing problems fever, infection mouth sores unusual bleeding or bruising unusually weak or tired Side effects  that usually do not require medical attention (report to your doctor or health care professional if they continue or are bothersome): constipation or diarrhea loss of appetite nausea, vomiting This list may not describe all possible side effects. Call your doctor for medical advice about side effects. You may report side effects to FDA at 1-800-FDA-1088. Where should I keep my medication? This drug is given in a hospital or clinic and will not be stored at home. NOTE: This sheet is a summary. It may not cover all possible information. If you have questions about this medicine, talk to your doctor, pharmacist, or health care provider.  2023 Elsevier/Gold Standard (2008-05-12 00:00:00)  Fluorouracil, 5-FU injection What is this medication? FLUOROURACIL, 5-FU (flure oh YOOR a sil) is a chemotherapy drug. It slows the growth of cancer cells. This medicine is used to treat many types of cancer like breast cancer, colon or rectal cancer, pancreatic cancer, and stomach cancer. This medicine may be used for other purposes; ask your health care provider or pharmacist if you have questions. COMMON BRAND NAME(S): Adrucil What should I tell my care team before I take this medication? They need to know if you have any of these conditions: blood disorders dihydropyrimidine dehydrogenase (DPD) deficiency infection (especially a virus infection such as chickenpox, cold sores, or herpes) kidney disease liver disease malnourished, poor nutrition recent or ongoing radiation therapy an unusual or allergic reaction to fluorouracil, other chemotherapy, other medicines, foods, dyes, or preservatives pregnant or trying to get pregnant breast-feeding How should I use this medication? This drug is given as an infusion or injection into a vein. It is administered in a hospital or clinic by a specially trained health care professional. Talk to your pediatrician regarding the use of this medicine in children.  Special care may be needed. Overdosage: If you think you have taken too much of this medicine contact a poison control center or emergency room at once. NOTE: This medicine is only for you. Do not share this medicine with others. What if I miss a dose? It is important not to miss your dose. Call your doctor or health care professional if you are unable to keep an appointment. What may interact with this medication? Do not take this medicine with any of the following medications: live virus vaccines This medicine may also interact with the following medications: medicines that treat or prevent blood clots like warfarin, enoxaparin, and dalteparin  This list may not describe all possible interactions. Give your health care provider a list of all the medicines, herbs, non-prescription drugs, or dietary supplements you use. Also tell them if you smoke, drink alcohol, or use illegal drugs. Some items may interact with your medicine. What should I watch for while using this medication? Visit your doctor for checks on your progress. This drug may make you feel generally unwell. This is not uncommon, as chemotherapy can affect healthy cells as well as cancer cells. Report any side effects. Continue your course of treatment even though you feel ill unless your doctor tells you to stop. In some cases, you may be given additional medicines to help with side effects. Follow all directions for their use. Call your doctor or health care professional for advice if you get a fever, chills or sore throat, or other symptoms of a cold or flu. Do not treat yourself. This drug decreases your body's ability to fight infections. Try to avoid being around people who are sick. This medicine may increase your risk to bruise or bleed. Call your doctor or health care professional if you notice any unusual bleeding. Be careful brushing and flossing your teeth or using a toothpick because you may get an infection or bleed more  easily. If you have any dental work done, tell your dentist you are receiving this medicine. Avoid taking products that contain aspirin, acetaminophen, ibuprofen, naproxen, or ketoprofen unless instructed by your doctor. These medicines may hide a fever. Do not become pregnant while taking this medicine. Women should inform their doctor if they wish to become pregnant or think they might be pregnant. There is a potential for serious side effects to an unborn child. Talk to your health care professional or pharmacist for more information. Do not breast-feed an infant while taking this medicine. Men should inform their doctor if they wish to father a child. This medicine may lower sperm counts. Do not treat diarrhea with over the counter products. Contact your doctor if you have diarrhea that lasts more than 2 days or if it is severe and watery. This medicine can make you more sensitive to the sun. Keep out of the sun. If you cannot avoid being in the sun, wear protective clothing and use sunscreen. Do not use sun lamps or tanning beds/booths. What side effects may I notice from receiving this medication? Side effects that you should report to your doctor or health care professional as soon as possible: allergic reactions like skin rash, itching or hives, swelling of the face, lips, or tongue low blood counts - this medicine may decrease the number of white blood cells, red blood cells and platelets. You may be at increased risk for infections and bleeding. signs of infection - fever or chills, cough, sore throat, pain or difficulty passing urine signs of decreased platelets or bleeding - bruising, pinpoint red spots on the skin, black, tarry stools, blood in the urine signs of decreased red blood cells - unusually weak or tired, fainting spells, lightheadedness breathing problems changes in vision chest pain mouth sores nausea and vomiting pain, swelling, redness at site where injected pain,  tingling, numbness in the hands or feet redness, swelling, or sores on hands or feet stomach pain unusual bleeding Side effects that usually do not require medical attention (report to your doctor or health care professional if they continue or are bothersome): changes in finger or toe nails diarrhea dry or itchy skin hair loss headache loss of appetite sensitivity  of eyes to the light stomach upset unusually teary eyes This list may not describe all possible side effects. Call your doctor for medical advice about side effects. You may report side effects to FDA at 1-800-FDA-1088. Where should I keep my medication? This drug is given in a hospital or clinic and will not be stored at home. NOTE: This sheet is a summary. It may not cover all possible information. If you have questions about this medicine, talk to your doctor, pharmacist, or health care provider.  2023 Elsevier/Gold Standard (2021-10-05 00:00:00)

## 2022-05-16 NOTE — Progress Notes (Signed)
Ok to treat today with platelets of 97.

## 2022-05-16 NOTE — Progress Notes (Signed)
[place]   [phone] [phone]   Clinic Follow up Note   Patient Care Team: [name], MD as PCP - General (Internal Medicine) [name], NP as PCP - Hematology/Oncology (Nurse Practitioner) [name], MD as Consulting Physician (Orthopedic Surgery) [name], MD as Consulting Physician (Cardiology) [name], MD as Consulting Physician (Oncology) Royston Bake, RN as Oncology Nurse Navigator (Oncology)  Date of Service:  05/16/2022  CHIEF COMPLAINT: f/u of metastatic pancreatic cancer  CURRENT THERAPY:  First line FOLFIRINOX, starting 08/13/21, now q3weeks  ASSESSMENT & PLAN:  Zachary Reid is a 66 y.o. male with   1. Metastatic pancreatic adenocarcinoma, [id number] with numerous small cavitary lesions in bilateral lungs, MMR normal  -found incidentally on recent CT scan for left kidney stone. He also had some nonspecific symptoms, including epigastric pain, chronic right flank pain, dry cough, and significant weight loss in the past several years. -colonoscopy and EUS on 07/26/21 demonstrated a solid-cystic mass in the genu-body of the pancreas, with endosonographic evidence of SMA abutment, T4N0. Cytology confirmed adenocarcinoma.  -Baseline CA 19-9 elevated to 273 on 07/03/21 -He began first-line mFOLFIRINOX on 08/13/21, tolerated moderately well with severe fatigue for 3-7 days, constipation, and mild pancytopenia. He is able to recover well. GCSF added with cycle 2 -restaging CT CAP on 05/14/22 showed overall stable disease, unchanged pancreatic head mass or pulmonary nodules. No new disease. I personally reviewed the images and discussed the results with them today, and answered their questions  -his CA 19-9 has slightly trended up, most recently 140 on 05/14/22, which is stable from 141 on 6/8 but up from 116 on 5/18. -he is now receiving treatment every 3 weeks, which he tolerates much better. He did have verily slow  recovery after last cycle chemo. Labs from 05/14/22 reviewed, overall stable. Plan to lower the oxali dose again. If he continues to have bothersome side effects, including neuropathy, I will discontinue oxali, and change to liposomal irinotecan and 5-fu, I reviewed with patient and his wife today.   2. Symptom Management: abdominal pain, neuropathy -he also reports intermittent abdominal pain; he uses tramadol as needed, up to twice a day. 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 He is undergoing a form of light therapy and feels it is helpful.   3. Anxiety, Insomnia -anxiety began 2022. He was started on lexapro on 04/19/21 and wellbutrin on 06/05/21 but came off them in August due to constipation -reported new anxiety attacks since diagnosis. Rx Xanax on 07/12/21, using as needed as prescribed. -he notes he still has insomnia. Ambien prescribed 07/27/21, brand is more effective than generic. I previously reviewed that he should not take ambien, tramadol, or xanax at the same time. -he was previously referred to Dr. Michail Sermon, did not find this helpful and does not wish to f/u.   4. Genetic Testing -he reports pancreatic cancer in his mother, prostate cancer in his father, ovarian in PGM, and lung in PGF (smoker). -he proceeded with testing on 07/30/21. 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 Left kidney stone, right flank pain -f/u urology for kidney stone   6. DM -he is now followed by Dr. Loanne Drilling, now on metformin and glucotrol. -BG has remained elevated lately, 329 on 05/14/22. He notes he is scheduled for f/u.   7.  Peripheral neuropathy, grade 1 -Started in March 2023, likely secondary to oxaliplatin -We will decrease oxaliplatin dose, continue monitor closely. -He is  on low-dose gabapentin, he has tried light therapy which helped. We discussed he can increase his gabapentin dose if needed.     PLAN: -proceed with C15 FOLFIRINOX with further reduced oxaliplatin dose to 30 mg/m  due to neuropathy and thrombocytopenia, and fatigue              -Udenyca on day 3 -I refilled his tramadol and Xanax -lab, flush, f/u, and FOLFIRINOX in 4 and 7 weeks due to patient vacation             -he prefers Thursday morning appointments   No problem-specific Assessment & Plan notes found for this encounter.   SUMMARY OF ONCOLOGIC HISTORY: Oncology History Overview Note   Cancer Staging  Pancreatic adenocarcinoma [facility]) Staging form: Exocrine Pancreas, AJCC 8th Edition - Clinical stage from 07/26/2021: Stage IV (cT4, cN0, cM1) - Signed by [name], MD on 07/28/2021    Pancreatic adenocarcinoma ([place])  07/02/2021 Initial Diagnosis   Pancreatic adenocarcinoma ([place])   07/26/2021 Cancer Staging   Staging form: Exocrine Pancreas, AJCC 8th Edition - Clinical stage from 07/26/2021: Stage IV (cT4, cN0, cM1) - Signed by [name], MD on 07/28/2021 Stage prefix: Initial diagnosis Total positive nodes: 0   08/13/2021 -  Chemotherapy   Patient is on Treatment Plan : PANCREAS Modified FOLFIRINOX q14d x 4 cycles      Genetic Testing   [facility] CancerNext-Expanded results (77 genes) were negative. No pathogenic variants were identified. 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 The report date is 08/15/2021.    The CancerNext-Expanded gene panel offered by [facility] and includes sequencing, rearrangement, and RNA analysis for the following 77 genes: AIP, ALK, APC, ATM, AXIN2, BAP1, BARD1, BLM, BMPR1A, BRCA1, BRCA2, BRIP1, CDC73, CDH1, CDK4, CDKN1B, CDKN2A, CHEK2, CTNNA1, DICER1, FANCC, FH, FLCN, GALNT12, KIF1B, LZTR1, MAX, MEN1, MET, MLH1, MSH2, MSH3, MSH6, MUTYH, NBN, NF1, NF2, NTHL1, PALB2, PHOX2B, PMS2, POT1, PRKAR1A, PTCH1, PTEN, RAD51C, RAD51D, RB1, RECQL, RET, SDHA, SDHAF2, SDHB, SDHC, SDHD, SMAD4, SMARCA4, SMARCB1, SMARCE1, STK11, SUFU, TMEM127, TP53, TSC1, TSC2, VHL and XRCC2 (sequencing and deletion/duplication); EGFR, EGLN1, HOXB13, KIT, MITF, PDGFRA,  POLD1, and POLE (sequencing only); EPCAM and GREM1 (deletion/duplication only).     10/25/2021 Imaging   EXAM: CT CHEST, ABDOMEN, AND PELVIS WITH CONTRAST  IMPRESSION: 1. Innumerable bilateral pulmonary nodules, many of which are cavitary, consistent with metastatic disease. These nodules show no substantial change in are minimally progressed in the interval. 2. Mix cystic and solid lesion in the head and body of the pancreas is similar to prior and also comparing back to MRI 07/02/2021. 3. Hepatic cysts. 4. 8 mm nonobstructing left renal stone. 5. Aortic Atherosclerosis (ICD10-I70.0).   01/28/2022 Imaging   EXAM: CT CHEST, ABDOMEN, AND PELVIS WITH CONTRAST  IMPRESSION: 1. Innumerable bilateral small solid and cavitary pulmonary nodules, some of the cavitary nodules are slightly less thick-walled when compared with prior exam. 2. Mixed cystic and solid lesion in the head and body of the pancreas is similar to prior exam. 3. Nonobstructing left renal stone. 4.  Aortic Atherosclerosis (ICD10-I70.0).      INTERVAL HISTORY:  Zachary Reid is here for a follow up of metastatic pancreatic cancer. He was last seen by NP Mendel Ryder on 04/25/22. He presents to the clinic accompanied by his wife. He reports he is overall stable.   All other systems were reviewed with the patient and are negative.  MEDICAL HISTORY:  Past Medical History:  Diagnosis Date  Abnormal EKG    DM (diabetes mellitus) ([place])    Hyperlipidemia    Hypogonadism male    IBS (irritable bowel syndrome)    Obesity    BMI 31   pancreatic ca 06/28/2021   Vitamin D deficiency     SURGICAL HISTORY: Past Surgical History:  Procedure Laterality Date   APPENDECTOMY     BIOPSY  07/26/2021   Procedure: BIOPSY;  Surgeon: Irving Copas., MD;  Location: [facility] [place];  Service: Gastroenterology;;   COLONOSCOPY WITH PROPOFOL N/A 07/26/2021   Procedure: COLONOSCOPY WITH PROPOFOL;  Surgeon: Irving Copas., MD;   Location: [facility];  Service: Gastroenterology;  Laterality: N/A;   ESOPHAGOGASTRODUODENOSCOPY (EGD) WITH PROPOFOL N/A 07/26/2021   Procedure: ESOPHAGOGASTRODUODENOSCOPY (EGD) WITH PROPOFOL;  Surgeon: Rush Landmark Telford Nab., MD;  Location: [place];  Service: Gastroenterology;  Laterality: N/A;   EUS N/A 07/26/2021   Procedure: UPPER ENDOSCOPIC ULTRASOUND (EUS) RADIAL;  Surgeon: Irving Copas., MD;  Location: [place];  Service: Gastroenterology;  Laterality: N/A;   FINE NEEDLE ASPIRATION  07/26/2021   Procedure: FINE NEEDLE ASPIRATION (FNA) LINEAR;  Surgeon: Rush Landmark Telford Nab., MD;  Location: [place];  Service: Gastroenterology;;   IR IMAGING GUIDED PORT INSERTION  08/10/2021   POLYPECTOMY  07/26/2021   Procedure: POLYPECTOMY;  Surgeon: Rush Landmark Telford Nab., MD;  Location: [facility];  Service: Gastroenterology;;   SHOULDER SURGERY Right     I have reviewed the social history and family history with the patient and they are unchanged from previous note.  ALLERGIES:  is allergic to lipitor [atorvastatin].  MEDICATIONS:  Current Outpatient Medications  Medication Sig Dispense Refill   ALPRAZolam (XANAX) 0.25 MG tablet Take 1-2 tablets (0.25-0.5 mg total) by mouth 2 (two) times daily as needed for anxiety. 60 tablet 0   aspirin EC 81 MG tablet Take 81 mg by mouth in the morning and at bedtime. Swallow whole.     Cholecalciferol (VITAMIN D3) 125 MCG (5000 UT) CAPS Take 5,000 Units by mouth in the morning and at bedtime.     gabapentin (NEURONTIN) 600 MG tablet Take 1 tablet (600 mg total) by mouth 3 (three) times daily. 90 tablet 1   glucose blood test strip Use as instructed 100 each 12   hydrocortisone (ANUSOL-HC) 25 MG suppository Place 1 suppository (25 mg total) rectally at bedtime. QHS x 1-week and then Every Other night until prescription complete 12 suppository 0   hyoscyamine (LEVSIN SL) 0.125 MG SL tablet DISSOLVE ONE TABLET UNDER THE TONGUE 4 TIMES DAILY  UP TO EVERY 4 HOURS AS NEEDED FOR NAUSEA, BLOATING, CRAMPING, OR DIARRHEA 100 tablet 0   Insulin NPH, Human,, Isophane, (HUMULIN N KWIKPEN) 100 UNIT/ML Kiwkpen Inject 18 Units into the skin every morning. And pen needles 1/day 30 mL 1   lidocaine-prilocaine (EMLA) cream Apply to affected area once 30 g 3   ondansetron (ZOFRAN ODT) 4 MG disintegrating tablet Take 1 tablet (4 mg total) by mouth every 8 (eight) hours as needed for nausea or vomiting. 20 tablet 0   ondansetron (ZOFRAN) 8 MG tablet Take 1 tablet (8 mg total) by mouth 2 (two) times daily as needed. Start on day 3 after chemotherapy. 30 tablet 1   oxyCODONE (ROXICODONE) 5 MG immediate release tablet Take 1 tablet (5 mg total) by mouth every 6 (six) hours as needed for up to 6 doses for severe pain. 30 tablet 0   prochlorperazine (COMPAZINE) 10 MG tablet Take 1 tablet (10 mg total) by mouth every  6 (six) hours as needed (Nausea or vomiting). 30 tablet 1   traMADol (ULTRAM) 50 MG tablet Take 1 tablet (50 mg total) by mouth every 6 (six) hours as needed. 90 tablet 0   zolpidem (AMBIEN) 10 MG tablet Take 1 tablet (10 mg total) by mouth at bedtime as needed for sleep. 30 tablet 0   No current facility-administered medications for this visit.   Facility-Administered Medications Ordered in Other Visits  Medication Dose Route Frequency Provider Last Rate Last Admin   atropine injection 0.5 mg  0.5 mg Intravenous Once [name], MD       fluorouracil (ADRUCIL) 3,800 mg in sodium chloride 0.9 % 74 mL chemo infusion  2,000 mg/m2 (Treatment Plan Recorded) Intravenous 1 day or 1 dose [name], MD       heparin lock flush 100 unit/mL  500 Units Intracatheter Once PRN [name], MD       irinotecan (CAMPTOSAR) 280 mg in sodium chloride 0.9 % 500 mL chemo infusion  150 mg/m2 (Treatment Plan Recorded) Intravenous Once [name], MD       leucovorin 764 mg in sodium chloride 0.9 % 250 mL infusion  400 mg/m2 (Treatment Plan Recorded) Intravenous Once [name], MD       oxaliplatin (ELOXATIN) 55 mg in dextrose 5 % 500 mL chemo infusion  30 mg/m2 (Treatment Plan Recorded) Intravenous Once [name], MD       sodium chloride flush (NS) 0.9 % injection 10 mL  10 mL Intracatheter PRN [name], MD        PHYSICAL EXAMINATION: ECOG PERFORMANCE STATUS: 1 - Symptomatic but completely ambulatory  Vitals:   05/16/22 0911  BP: 125/74  Pulse: 91  Resp: 18  Temp: 97.9 F (36.6 C)  SpO2: 98%   Wt Readings from Last 3 Encounters:  05/16/22 165 lb 3.2 oz (74.9 kg)  04/25/22 166 lb 6.4 oz (75.5 kg)  04/04/22 169 lb 3.2 oz (76.7 kg)     GENERAL:alert, no distress and comfortable SKIN: skin color normal, no rashes or significant lesions EYES: normal, Conjunctiva are pink and non-injected, sclera clear  NEURO: alert & oriented x 3 with fluent speech  LABORATORY DATA:  I have reviewed the data as listed    Latest Ref Rng & Units 05/14/2022    9:30 AM 04/25/2022    8:19 AM 04/04/2022    8:10 AM  CBC  WBC 4.0 - 10.5 K/uL 7.1  6.2  5.4   Hemoglobin 13.0 - 17.0 g/dL 12.5  13.6  12.2   Hematocrit 39.0 - 52.0 % 35.2  38.6  35.3   Platelets 150 - 400 K/uL 97  97  76         Latest Ref Rng & Units 05/14/2022    9:30 AM 04/25/2022    8:19 AM 04/04/2022    8:10 AM  CMP  Glucose 70 - 99 mg/dL 329  328  423   BUN 8 - 23 mg/dL _0 Creatinine 0.61 - 1.24 mg/dL 0.96  0.90  0.88   Sodium 135 - 145 mmol/L 134  136  136   Potassium 3.5 - 5.1 mmol/L 4.2  3.8  4.2   Chloride 98 - 111 mmol/L 102  103  103   CO2 22 - 32 mmol/L _1 Calcium 8.9 - 10.3 mg/dL 9.5  9.7  8.9   Total Protein 6.5 - 8.1 g/dL 6.6  7.1  6.8   Total Bilirubin 0.3 - 1.2 mg/dL 0.6  0.5  0.4   Alkaline Phos 38 - 126 U/L 128  130  131   AST 15 - 41 U/L _0 ALT 0 - 44 U/L _1 RADIOGRAPHIC STUDIES: I have personally reviewed the radiological images as listed and agreed with the findings in the report. No results found.    No orders of the  defined types were placed in this encounter.  All questions were answered. The patient knows to call the clinic with any problems, questions or concerns. No barriers to learning was detected. The total time spent in the appointment was 40 minutes.     [name], MD 05/16/2022   I, Wilburn Mylar, am acting as scribe for [name], MD.   I have reviewed the above documentation for accuracy and completeness, and I agree with the above.

## 2022-05-17 ENCOUNTER — Other Ambulatory Visit: Payer: Self-pay

## 2022-05-18 ENCOUNTER — Inpatient Hospital Stay: Payer: Medicare Other | Attending: Hematology

## 2022-05-18 VITALS — BP 132/70 | HR 84 | Temp 97.8°F | Resp 18

## 2022-05-18 DIAGNOSIS — Z7982 Long term (current) use of aspirin: Secondary | ICD-10-CM | POA: Diagnosis not present

## 2022-05-18 DIAGNOSIS — I700 Atherosclerosis of aorta: Secondary | ICD-10-CM | POA: Insufficient documentation

## 2022-05-18 DIAGNOSIS — E785 Hyperlipidemia, unspecified: Secondary | ICD-10-CM | POA: Insufficient documentation

## 2022-05-18 DIAGNOSIS — Z800 Family history of malignant neoplasm of digestive organs: Secondary | ICD-10-CM | POA: Insufficient documentation

## 2022-05-18 DIAGNOSIS — K589 Irritable bowel syndrome without diarrhea: Secondary | ICD-10-CM | POA: Diagnosis not present

## 2022-05-18 DIAGNOSIS — Z8042 Family history of malignant neoplasm of prostate: Secondary | ICD-10-CM | POA: Insufficient documentation

## 2022-05-18 DIAGNOSIS — Z5189 Encounter for other specified aftercare: Secondary | ICD-10-CM | POA: Diagnosis not present

## 2022-05-18 DIAGNOSIS — Z5111 Encounter for antineoplastic chemotherapy: Secondary | ICD-10-CM | POA: Diagnosis present

## 2022-05-18 DIAGNOSIS — Z79899 Other long term (current) drug therapy: Secondary | ICD-10-CM | POA: Insufficient documentation

## 2022-05-18 DIAGNOSIS — R5383 Other fatigue: Secondary | ICD-10-CM | POA: Diagnosis not present

## 2022-05-18 DIAGNOSIS — Z794 Long term (current) use of insulin: Secondary | ICD-10-CM | POA: Diagnosis not present

## 2022-05-18 DIAGNOSIS — R978 Other abnormal tumor markers: Secondary | ICD-10-CM | POA: Diagnosis not present

## 2022-05-18 DIAGNOSIS — E1140 Type 2 diabetes mellitus with diabetic neuropathy, unspecified: Secondary | ICD-10-CM | POA: Diagnosis not present

## 2022-05-18 DIAGNOSIS — C220 Liver cell carcinoma: Secondary | ICD-10-CM | POA: Insufficient documentation

## 2022-05-18 DIAGNOSIS — G4700 Insomnia, unspecified: Secondary | ICD-10-CM | POA: Diagnosis not present

## 2022-05-18 DIAGNOSIS — G8929 Other chronic pain: Secondary | ICD-10-CM | POA: Insufficient documentation

## 2022-05-18 DIAGNOSIS — C259 Malignant neoplasm of pancreas, unspecified: Secondary | ICD-10-CM

## 2022-05-18 MED ORDER — PEGFILGRASTIM-CBQV 6 MG/0.6ML ~~LOC~~ SOSY
6.0000 mg | PREFILLED_SYRINGE | Freq: Once | SUBCUTANEOUS | Status: AC
  Administered 2022-05-18: 6 mg via SUBCUTANEOUS

## 2022-05-18 MED ORDER — HEPARIN SOD (PORK) LOCK FLUSH 100 UNIT/ML IV SOLN
500.0000 [IU] | Freq: Once | INTRAVENOUS | Status: AC | PRN
  Administered 2022-05-18: 500 [IU]

## 2022-05-18 MED ORDER — SODIUM CHLORIDE 0.9% FLUSH
10.0000 mL | INTRAVENOUS | Status: DC | PRN
  Administered 2022-05-18: 10 mL

## 2022-05-22 ENCOUNTER — Ambulatory Visit (INDEPENDENT_AMBULATORY_CARE_PROVIDER_SITE_OTHER): Payer: Medicare Other | Admitting: Endocrinology

## 2022-05-22 ENCOUNTER — Telehealth: Payer: Self-pay | Admitting: Hematology

## 2022-05-22 ENCOUNTER — Encounter: Payer: Self-pay | Admitting: Endocrinology

## 2022-05-22 VITALS — BP 106/62 | HR 58 | Ht 71.0 in | Wt 159.4 lb

## 2022-05-22 DIAGNOSIS — E1165 Type 2 diabetes mellitus with hyperglycemia: Secondary | ICD-10-CM

## 2022-05-22 DIAGNOSIS — Z794 Long term (current) use of insulin: Secondary | ICD-10-CM | POA: Diagnosis not present

## 2022-05-22 MED ORDER — TRESIBA FLEXTOUCH 100 UNIT/ML ~~LOC~~ SOPN: PEN_INJECTOR | SUBCUTANEOUS | 1 refills | Status: DC

## 2022-05-22 MED ORDER — INSULIN LISPRO (1 UNIT DIAL) 100 UNIT/ML (KWIKPEN): PEN_INJECTOR | SUBCUTANEOUS | 1 refills | Status: DC

## 2022-05-22 NOTE — Progress Notes (Signed)
Patient ID: Zachary Reid, male   DOB: 11-16-1956, 66 y.o.   MRN: [id number]           Reason for Appointment: Type II Diabetes follow-up   History of Present Illness   Diagnosis date: 2013  Previous history:  Non-insulin hypoglycemic drugs previously used: Glipizide, metformin, Ozempic Metformin was stopped in 4/23 when starting insulin Insulin was started in 2023  A1c range in the last few years is: 7.4-10% A1c has been higher since 2021  Recent history:     Non-insulin hypoglycemic drugs:      Insulin regimen: 20 units of NPH in the morning          Side effects from medications: None  Current self management, blood sugar patterns and problems identified:  A1c is last 9.6 and usually over 9% recently He apparently gets dexamethasone every 3 weeks by infusion for his chemotherapy but appears to have persistently high blood sugars Because of poor control without insulin and weight loss he has been on insulin since at least 01/2022 Since he thinks his blood sugars are always high he does not monitor them regularly However he thinks his blood sugars are higher later in the day especially after dinner He does not know whether his blood sugars are much higher right after his dexamethasone infusion Because of fatigue from his chemotherapy is usually not very active for about a week and subsequently is more active as usual No recent consultations with dietitian for meal planning  Exercise: Inconsistent  Diet management: May not always restrict sweets     Hypoglycemia:  none    Glucometer: One Touch.           Blood Glucose readings from recall:  PRE-MEAL Fasting Lunch Dinner Bedtime Overall  Glucose range: 240      Mean/median:        POST-MEAL PC Breakfast PC Lunch PC Dinner  Glucose range:   About 420  Mean/median:       Weight control:  Wt Readings from Last 3 Encounters:  05/22/22 159 lb 6.4 oz (72.3 kg)  05/16/22 165 lb 3.2 oz (74.9 kg)  04/25/22 166 lb  6.4 oz (75.5 kg)            Diabetes labs:  Lab Results  Component Value Date   HGBA1C 9.6 (A) 03/13/2022   HGBA1C 10.0 (A) 12/27/2021   HGBA1C 9.2 (H) 06/05/2021   Lab Results  Component Value Date   MICROALBUR 1.1 12/05/2020   LDLCALC 92 06/05/2021   CREATININE 0.96 05/14/2022     Allergies as of 05/22/2022       Reactions   Lipitor [atorvastatin]    fatigue        Medication List        Accurate as of May 22, 2022  2:01 PM. If you have any questions, ask your nurse or doctor.          STOP taking these medications    HumuLIN N KwikPen 100 UNIT/ML Kiwkpen Generic drug: Insulin NPH (Human) ([place]) Stopped by: Elayne Snare, MD       TAKE these medications    ALPRAZolam 0.25 MG tablet Commonly known as: XANAX Take 1-2 tablets (0.25-0.5 mg total) by mouth 2 (two) times daily as needed for anxiety.   aspirin EC 81 MG tablet Take 81 mg by mouth in the morning and at bedtime. Swallow whole.   gabapentin 600 MG tablet Commonly known as: NEURONTIN Take 1 tablet (600 mg total) by  mouth 3 (three) times daily.   glucose blood test strip Use as instructed   hydrocortisone 25 MG suppository Commonly known as: ANUSOL-HC Place 1 suppository (25 mg total) rectally at bedtime. QHS x 1-week and then Every Other night until prescription complete   hyoscyamine 0.125 MG SL tablet Commonly known as: LEVSIN SL DISSOLVE ONE TABLET UNDER THE TONGUE 4 TIMES DAILY UP TO EVERY 4 HOURS AS NEEDED FOR NAUSEA, BLOATING, CRAMPING, OR DIARRHEA   insulin lispro 100 UNIT/ML KwikPen Commonly known as: HumaLOG KwikPen 10 to 15 units before meals as directed Started by: Elayne Snare, MD   lidocaine-prilocaine cream Commonly known as: EMLA Apply to affected area once   ondansetron 4 MG disintegrating tablet Commonly known as: Zofran ODT Take 1 tablet (4 mg total) by mouth every 8 (eight) hours as needed for nausea or vomiting.   ondansetron 8 MG tablet Commonly known as:  Zofran Take 1 tablet (8 mg total) by mouth 2 (two) times daily as needed. Start on day 3 after chemotherapy.   oxyCODONE 5 MG immediate release tablet Commonly known as: Roxicodone Take 1 tablet (5 mg total) by mouth every 6 (six) hours as needed for up to 6 doses for severe pain.   prochlorperazine 10 MG tablet Commonly known as: COMPAZINE Take 1 tablet (10 mg total) by mouth every 6 (six) hours as needed (Nausea or vomiting).   traMADol 50 MG tablet Commonly known as: ULTRAM Take 1 tablet (50 mg total) by mouth every 6 (six) hours as needed.   Tyler Aas FlexTouch 100 UNIT/ML FlexTouch Pen Generic drug: insulin degludec Inject 30 units every morning and adjust as directed, maximum daily dose 50 units Started by: Elayne Snare, MD   Vitamin D3 125 MCG (5000 UT) Caps Take 5,000 Units by mouth in the morning and at bedtime.   zolpidem 10 MG tablet Commonly known as: AMBIEN Take 1 tablet (10 mg total) by mouth at bedtime as needed for sleep.        Allergies:  Allergies  Allergen Reactions   Lipitor [Atorvastatin]     fatigue    Past Medical History:  Diagnosis Date   Abnormal EKG    DM (diabetes mellitus) ([facility])    Hyperlipidemia    Hypogonadism male    IBS (irritable bowel syndrome)    Obesity    BMI 31   pancreatic ca 06/28/2021   Vitamin D deficiency     Past Surgical History:  Procedure Laterality Date   APPENDECTOMY     BIOPSY  07/26/2021   Procedure: BIOPSY;  Surgeon: Irving Copas., MD;  Location: [facility];  Service: Gastroenterology;;   COLONOSCOPY WITH PROPOFOL N/A 07/26/2021   Procedure: COLONOSCOPY WITH PROPOFOL;  Surgeon: Irving Copas., MD;  Location: [facility];  Service: Gastroenterology;  Laterality: N/A;   ESOPHAGOGASTRODUODENOSCOPY (EGD) WITH PROPOFOL N/A 07/26/2021   Procedure: ESOPHAGOGASTRODUODENOSCOPY (EGD) WITH PROPOFOL;  Surgeon: Rush Landmark Telford Nab., MD;  Location: [facility];  Service: Gastroenterology;  Laterality:  N/A;   EUS N/A 07/26/2021   Procedure: UPPER ENDOSCOPIC ULTRASOUND (EUS) RADIAL;  Surgeon: Irving Copas., MD;  Location: [facility];  Service: Gastroenterology;  Laterality: N/A;   FINE NEEDLE ASPIRATION  07/26/2021   Procedure: FINE NEEDLE ASPIRATION (FNA) LINEAR;  Surgeon: Irving Copas., MD;  Location: [facility];  Service: Gastroenterology;;   IR IMAGING GUIDED PORT INSERTION  08/10/2021   POLYPECTOMY  07/26/2021   Procedure: POLYPECTOMY;  Surgeon: Irving Copas., MD;  Location: [facility];  Service:  Gastroenterology;;   SHOULDER SURGERY Right     Family History  Problem Relation Age of Onset   Pancreatic cancer Mother 66   Prostate cancer Father 65       Prostate   Diabetes Father    Hypertension Father    Drug abuse Brother    Kidney Stones Brother    Ovarian cancer Paternal Grandmother 18   Lung cancer Paternal Grandfather        Cigar smoker   Colon cancer Neg Hx    Colon polyps Neg Hx    Esophageal cancer Neg Hx    Stomach cancer Neg Hx    Rectal cancer Neg Hx     Social History:  reports that he has never smoked. He has never used smokeless tobacco. He reports that he does not drink alcohol and does not use drugs.  Review of Systems:  Last diabetic eye exam date 9/21  Last foot exam date: 2020  Symptoms of neuropathy: None  Hypertension: Not present  BP Readings from Last 3 Encounters:  05/22/22 106/62  05/18/22 132/70  05/16/22 125/74    Lipid management: Previously on simvastatin, stopped by PCP    Lab Results  Component Value Date   CHOL 157 06/05/2021   CHOL 195 03/05/2021   CHOL 191 12/05/2020   Lab Results  Component Value Date   HDL 42 06/05/2021   HDL 40 03/05/2021   HDL 43 12/05/2020   Lab Results  Component Value Date   LDLCALC 92 06/05/2021   LDLCALC 130 (H) 03/05/2021   LDLCALC 118 (H) 12/05/2020   Lab Results  Component Value Date   TRIG 133 06/05/2021   TRIG 132 03/05/2021   TRIG 186 (H)  12/05/2020   Lab Results  Component Value Date   CHOLHDL 3.7 06/05/2021   CHOLHDL 4.9 03/05/2021   CHOLHDL 4.4 12/05/2020   No results found for: "LDLDIRECT"   Examination:   BP 106/62   Pulse (!) 58   Ht '5\' 11"'$  (1.803 m)   Wt 159 lb 6.4 oz (72.3 kg)   SpO2 96%   BMI 22.23 kg/m   Body mass index is 22.23 kg/m.    ASSESSMENT/ PLAN:    Diabetes type 2:   Current regimen:  See history of present illness for detailed discussion of current diabetes management, blood sugar patterns and problems identified  A1c is recently 9.6 %  Blood glucose control is consistently poor This is also not adequately assessed because of lack of consistent glucose monitoring  He is appearing to be insulin deficient, not clear if this is related to his pancreatic tumor With his blood sugars being as high as 400 especially with getting periodic steroid infusions he does need to be on basal bolus regimen of insulin for optimal control and this was discussed in detail  Plan: He will start testing blood sugar daily in the morning and at least once a day about 2 hours after one of your meals Please bring your monitor for review on the next visit Discussed the need for continuous glucose monitoring as a way of completely evaluating his blood sugar patterns, comedian's and help with adjusting his insulin doses Discussed how the freestyle libre sensor works, use of the phone app for this and availability of high and low sugar alerts We will send a prescription to [facility] for him to start getting freestyle libre sensor from a DME supplier He will let us know if he had any difficulty starting this  Start him on mealtime insulin with HUMALOG :  Today discussed in detail the need for mealtime insulin to cover postprandial spikes, action of mealtime insulin, use of the insulin pen, timing and action of the rapid acting insulin as well as starting dose and dosage titration to target the two-hour  reading of under 180  He will start with 8 units before each meal If blood sugar about 2 hours after the meal is still over 200 go up to 8 units for that meal the next day  He will likely need ADDITIONAL 6 to 8 units at mealtimes on the day of your dexamethasone infusion and for a couple of days more  TRESIBA insulin: This is his new basal insulin replacing NPH  Start with 26 units in the morning daily and increase by 4 units every 3 days until the waking up sugars are under at least 150 as on the flowsheet provided. Then continue the same dose.  If blood sugar is under 90 for 2 days in a row, reduce the dose by 2 units.  He will follow the flowsheet with instructions that was given in the office for monitoring his dosage and adjusting it as above  Follow-up with diabetes educator in about 2 weeks and follow-up here in 2 months  Patient Instructions  Start testing your blood sugar daily in the morning and at least once a day about 2 hours after one of your meals Please bring your monitor for review on the next visit  You will be getting freestyle libre sensor from a DME supplier who will call you to set up your current  HUMALOG insulin: This is a mealtime insulin to be taken right before eating  Start with 8 units before each meal If your blood sugar about 2 hours after the meal is still over 200 go up to 8 units for that meal the next day  You will likely need ADDITIONAL 6 to 8 units at mealtimes on the day of your dexamethasone infusion and for a couple of days more  TRESIBA insulin: This insulin provides blood sugar control for up to 24 hours.   Start with 26 units in the morning daily and increase by 4 units every 3 days until the waking up sugars are under at least 150 as on the flowsheet provided. Then continue the same dose.  If blood sugar is under 90 for 2 days in a row, reduce the dose by 2 units.  Note that this insulin does not control the rise of blood sugar with meals       Elayne Snare 05/22/2022, 2:01 PM

## 2022-05-22 NOTE — Telephone Encounter (Signed)
Left message with follow-up appointments per 6/29 los. 

## 2022-05-22 NOTE — Patient Instructions (Signed)
Start testing your blood sugar daily in the morning and at least once a day about 2 hours after one of your meals Please bring your monitor for review on the next visit  You will be getting freestyle libre sensor from a DME supplier who will call you to set up your current  HUMALOG insulin: This is a mealtime insulin to be taken right before eating  Start with 8 units before each meal If your blood sugar about 2 hours after the meal is still over 200 go up to 8 units for that meal the next day  You will likely need ADDITIONAL 6 to 8 units at mealtimes on the day of your dexamethasone infusion and for a couple of days more  TRESIBA insulin: This insulin provides blood sugar control for up to 24 hours.   Start with 26 units in the morning daily and increase by 4 units every 3 days until the waking up sugars are under at least 150 as on the flowsheet provided. Then continue the same dose.  If blood sugar is under 90 for 2 days in a row, reduce the dose by 2 units.  Note that this insulin does not control the rise of blood sugar with meals

## 2022-05-29 ENCOUNTER — Encounter: Payer: Self-pay | Admitting: Internal Medicine

## 2022-06-10 ENCOUNTER — Other Ambulatory Visit: Payer: Self-pay

## 2022-06-12 MED FILL — Fosaprepitant Dimeglumine For IV Infusion 150 MG (Base Eq): INTRAVENOUS | Qty: 5 | Status: AC

## 2022-06-13 ENCOUNTER — Inpatient Hospital Stay (HOSPITAL_BASED_OUTPATIENT_CLINIC_OR_DEPARTMENT_OTHER): Payer: Medicare Other | Admitting: Hematology

## 2022-06-13 ENCOUNTER — Encounter: Payer: Self-pay | Admitting: Hematology

## 2022-06-13 ENCOUNTER — Other Ambulatory Visit: Payer: Self-pay

## 2022-06-13 ENCOUNTER — Inpatient Hospital Stay: Payer: Medicare Other

## 2022-06-13 VITALS — BP 114/73 | HR 79 | Temp 97.7°F | Resp 16 | Wt 174.5 lb

## 2022-06-13 DIAGNOSIS — C259 Malignant neoplasm of pancreas, unspecified: Secondary | ICD-10-CM | POA: Diagnosis not present

## 2022-06-13 DIAGNOSIS — Z5111 Encounter for antineoplastic chemotherapy: Secondary | ICD-10-CM | POA: Diagnosis not present

## 2022-06-13 DIAGNOSIS — Z95828 Presence of other vascular implants and grafts: Secondary | ICD-10-CM

## 2022-06-13 DIAGNOSIS — E860 Dehydration: Secondary | ICD-10-CM

## 2022-06-13 LAB — CBC WITH DIFFERENTIAL (CANCER CENTER ONLY)
Abs Immature Granulocytes: 0.01 10*3/uL (ref 0.00–0.07)
Basophils Absolute: 0 10*3/uL (ref 0.0–0.1)
Basophils Relative: 1 %
Eosinophils Absolute: 0.4 10*3/uL (ref 0.0–0.5)
Eosinophils Relative: 6 %
HCT: 36.8 % — ABNORMAL LOW (ref 39.0–52.0)
Hemoglobin: 12.5 g/dL — ABNORMAL LOW (ref 13.0–17.0)
Immature Granulocytes: 0 %
Lymphocytes Relative: 20 %
Lymphs Abs: 1.2 10*3/uL (ref 0.7–4.0)
MCH: 33.3 pg (ref 26.0–34.0)
MCHC: 34 g/dL (ref 30.0–36.0)
MCV: 98.1 fL (ref 80.0–100.0)
Monocytes Absolute: 0.5 10*3/uL (ref 0.1–1.0)
Monocytes Relative: 8 %
Neutro Abs: 4 10*3/uL (ref 1.7–7.7)
Neutrophils Relative %: 65 %
Platelet Count: 108 10*3/uL — ABNORMAL LOW (ref 150–400)
RBC: 3.75 MIL/uL — ABNORMAL LOW (ref 4.22–5.81)
RDW: 12.8 % (ref 11.5–15.5)
WBC Count: 6.2 10*3/uL (ref 4.0–10.5)
nRBC: 0 % (ref 0.0–0.2)

## 2022-06-13 LAB — CMP (CANCER CENTER ONLY)
ALT: 19 U/L (ref 0–44)
AST: 19 U/L (ref 15–41)
Albumin: 3.8 g/dL (ref 3.5–5.0)
Alkaline Phosphatase: 101 U/L (ref 38–126)
Anion gap: 7 (ref 5–15)
BUN: 17 mg/dL (ref 8–23)
CO2: 24 mmol/L (ref 22–32)
Calcium: 8.8 mg/dL — ABNORMAL LOW (ref 8.9–10.3)
Chloride: 108 mmol/L (ref 98–111)
Creatinine: 0.94 mg/dL (ref 0.61–1.24)
GFR, Estimated: >60 mL/min (ref >60)
Glucose, Bld: 262 mg/dL — ABNORMAL HIGH (ref 70–99)
Potassium: 3.8 mmol/L (ref 3.5–5.1)
Sodium: 139 mmol/L (ref 135–145)
Total Bilirubin: 0.5 mg/dL (ref 0.3–1.2)
Total Protein: 6.6 g/dL (ref 6.5–8.1)

## 2022-06-13 MED ORDER — OXALIPLATIN CHEMO INJECTION 100 MG/20ML
30.0000 mg/m2 | Freq: Once | INTRAVENOUS | Status: AC
  Administered 2022-06-13: 55 mg via INTRAVENOUS
  Filled 2022-06-13: qty 11

## 2022-06-13 MED ORDER — TRAMADOL HCL 50 MG PO TABS
50.0000 mg | ORAL_TABLET | Freq: Four times a day (QID) | ORAL | 0 refills | Status: DC | PRN
Start: 2022-06-13 — End: 2022-07-25

## 2022-06-13 MED ORDER — HEPARIN SOD (PORK) LOCK FLUSH 100 UNIT/ML IV SOLN: 500.0000 [IU] | Freq: Once | INTRAVENOUS | Status: DC | PRN

## 2022-06-13 MED ORDER — ALPRAZOLAM 0.25 MG PO TABS: 0.2500 mg | ORAL_TABLET | Freq: Two times a day (BID) | ORAL | 0 refills | Status: DC | PRN

## 2022-06-13 MED ORDER — PALONOSETRON HCL INJECTION 0.25 MG/5ML
0.2500 mg | Freq: Once | INTRAVENOUS | Status: AC
  Administered 2022-06-13: 0.25 mg via INTRAVENOUS
  Filled 2022-06-13: qty 5

## 2022-06-13 MED ORDER — SODIUM CHLORIDE 0.9 % IV SOLN
150.0000 mg | Freq: Once | INTRAVENOUS | Status: AC
  Administered 2022-06-13: 150 mg via INTRAVENOUS
  Filled 2022-06-13: qty 150

## 2022-06-13 MED ORDER — ATROPINE SULFATE 1 MG/ML IV SOLN
0.5000 mg | Freq: Once | INTRAVENOUS | Status: AC | PRN
  Administered 2022-06-13: 0.5 mg via INTRAVENOUS
  Filled 2022-06-13: qty 1

## 2022-06-13 MED ORDER — SODIUM CHLORIDE 0.9% FLUSH
10.0000 mL | INTRAVENOUS | Status: DC | PRN
  Administered 2022-06-13: 10 mL

## 2022-06-13 MED ORDER — SODIUM CHLORIDE 0.9 % IV SOLN: Freq: Once | INTRAVENOUS | Status: AC

## 2022-06-13 MED ORDER — DEXAMETHASONE SODIUM PHOSPHATE 10 MG/ML IJ SOLN
6.0000 mg | Freq: Once | INTRAMUSCULAR | Status: AC
  Administered 2022-06-13: 6 mg via INTRAVENOUS
  Filled 2022-06-13: qty 1

## 2022-06-13 MED ORDER — SODIUM CHLORIDE 0.9% FLUSH
10.0000 mL | Freq: Once | INTRAVENOUS | Status: AC
  Administered 2022-06-13: 10 mL

## 2022-06-13 MED ORDER — DEXTROSE 5 % IV SOLN: Freq: Once | INTRAVENOUS | Status: AC

## 2022-06-13 MED ORDER — ZOLPIDEM TARTRATE 10 MG PO TABS: 10.0000 mg | ORAL_TABLET | Freq: Every evening | ORAL | 0 refills | Status: DC | PRN

## 2022-06-13 MED ORDER — SODIUM CHLORIDE 0.9 % IV SOLN
400.0000 mg/m2 | Freq: Once | INTRAVENOUS | Status: AC
  Administered 2022-06-13: 764 mg via INTRAVENOUS
  Filled 2022-06-13: qty 38.2

## 2022-06-13 MED ORDER — SODIUM CHLORIDE 0.9 % IV SOLN
2000.0000 mg/m2 | INTRAVENOUS | Status: DC
  Administered 2022-06-13: 3800 mg via INTRAVENOUS
  Filled 2022-06-13: qty 76

## 2022-06-13 MED ORDER — SODIUM CHLORIDE 0.9 % IV SOLN
150.0000 mg/m2 | Freq: Once | INTRAVENOUS | Status: AC
  Administered 2022-06-13: 280 mg via INTRAVENOUS
  Filled 2022-06-13: qty 14

## 2022-06-13 NOTE — Patient Instructions (Signed)
[facility]  Discharge Instructions: Thank you for choosing [facility] to provide your oncology and hematology care.   If you have a lab appointment with the [facility], please go directly to the [facility] and check in at the registration area.   Wear comfortable clothing and clothing appropriate for easy access to any Portacath or PICC line.   We strive to give you quality time with your provider. You may need to reschedule your appointment if you arrive late (15 or more minutes).  Arriving late affects you and other patients whose appointments are after yours.  Also, if you miss three or more appointments without notifying the office, you may be dismissed from the clinic at the provider's discretion.      For prescription refill requests, have your pharmacy contact our office and allow 72 hours for refills to be completed.    Today you received the following chemotherapy and/or immunotherapy agents: oxaliplatin, irinotecan, leucovorin, fluorouracil       To help prevent nausea and vomiting after your treatment, we encourage you to take your nausea medication as directed.  BELOW ARE SYMPTOMS THAT SHOULD BE REPORTED IMMEDIATELY: *FEVER GREATER THAN 100.4 F (38 C) OR HIGHER *CHILLS OR SWEATING *NAUSEA AND VOMITING THAT IS NOT CONTROLLED WITH YOUR NAUSEA MEDICATION *UNUSUAL SHORTNESS OF BREATH *UNUSUAL BRUISING OR BLEEDING *URINARY PROBLEMS (pain or burning when urinating, or frequent urination) *BOWEL PROBLEMS (unusual diarrhea, constipation, pain near the anus) TENDERNESS IN MOUTH AND THROAT WITH OR WITHOUT PRESENCE OF ULCERS (sore throat, sores in mouth, or a toothache) UNUSUAL RASH, SWELLING OR PAIN  UNUSUAL VAGINAL DISCHARGE OR ITCHING   Items with * indicate a potential emergency and should be followed up as soon as possible or go to the Emergency Department if any problems should occur.  Please show the CHEMOTHERAPY ALERT CARD or  IMMUNOTHERAPY ALERT CARD at check-in to the Emergency Department and triage nurse.  Should you have questions after your visit or need to cancel or reschedule your appointment, please contact [facility]  Dept: [phone]  and follow the prompts.  Office hours are 8:00 a.m. to 4:30 p.m. Monday - Friday. Please note that voicemails left after 4:00 p.m. may not be returned until the following business day.  We are closed weekends and major holidays. You have access to a nurse at all times for urgent questions. Please call the main number to the clinic Dept: [phone] and follow the prompts.   For any non-urgent questions, you may also contact your provider using MyChart. We now offer e-Visits for anyone 105 and older to request care online for non-urgent symptoms. For details visit [email or web address].   Also download the MyChart app! Go to the app store, search "MyChart", open the app, select [facility], and log in with your MyChart username and password.  Masks are optional in the cancer centers. If you would like for your care team to wear a mask while they are taking care of you, please let them know. For doctor visits, patients may have with them one support person who is at least 66 years old. At this time, visitors are not allowed in the infusion area.

## 2022-06-13 NOTE — Progress Notes (Signed)
Zachary Reid   [phone] [phone]   Clinic Follow up Note   Patient Care Team: [name], MD as PCP - General (Internal Medicine) [name], NP as PCP - Hematology/Oncology (Nurse Practitioner) [name], MD as Consulting Physician (Orthopedic Surgery) [name], MD as Consulting Physician (Cardiology) [name], MD as Consulting Physician (Oncology) Royston Bake, RN as Oncology Nurse Navigator (Oncology)  Date of Service:  06/13/2022  CHIEF COMPLAINT: f/u of metastatic pancreatic cancer  CURRENT THERAPY:  First line FOLFIRINOX, starting 08/13/21, now q3weeks  ASSESSMENT & PLAN:  Zachary Reid is a 66 y.o. male with   1. Metastatic pancreatic adenocarcinoma, [id number] with numerous small cavitary lesions in bilateral lungs, MMR normal  -found incidentally on recent CT scan for left kidney stone. He also had some nonspecific symptoms, including epigastric pain, chronic right flank pain, dry cough, and significant weight loss in the past several years. -colonoscopy and EUS on 07/26/21 demonstrated a solid-cystic mass in the genu-body of the pancreas, with endosonographic evidence of SMA abutment, T4N0. Cytology confirmed adenocarcinoma.  -Baseline CA 19-9 elevated to 273 on 07/03/21 -He began first-line mFOLFIRINOX on 08/13/21, tolerated moderately well with severe fatigue for 3-7 days, constipation, and mild pancytopenia. He is able to recover well. GCSF added with cycle 2 -restaging CT CAP on 05/14/22 showed overall stable disease, unchanged pancreatic head mass or pulmonary nodules. No new disease.  -his CA 19-9 has slightly trended up, most recently 140 on 05/14/22, which is stable from 141 on 6/8 but up from 116 on 5/18. -he is now receiving treatment every 3 weeks, but he still required further dose reduction. He recently recovered well with an extra week off for vacation. -labs reviewed, overall stable to improved, CA 19-9  is pending. Adequate to proceed with treatment at same dose. -Per his request, we will hold on G-CSF with this cycle (he is concerned that it caused fatigue).  We will repeat CBC next week, if ANC still low (<0.9) will give GCSF next week    2. Symptom Management: abdominal pain, neuropathy -he also reports intermittent abdominal pain; he uses tramadol as needed, up to twice a day. 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 He is undergoing a form of light therapy, which he feels is helpful, and is on low-dose gabapentin.   3. Anxiety, Insomnia -anxiety began 2022. He was started on lexapro on 04/19/21 and wellbutrin on 06/05/21 but came off them in August due to constipation -reported new anxiety attacks since diagnosis. Rx Xanax on 07/12/21, using as needed as prescribed. -he notes he still has insomnia. Ambien prescribed 07/27/21, brand is more effective than generic. I previously reviewed that he should not take ambien, tramadol, or xanax at the same time. -he was previously referred to Dr. Michail Sermon, did not find this helpful and does not wish to f/u.   4. Genetic Testing -he reports pancreatic cancer in his mother, prostate cancer in his father, ovarian in PGM, and lung in PGF (smoker). -he proceeded with testing on 07/30/21. 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 DM -he is now followed by Dr. Loanne Drilling, now on metformin and glucotrol. -BG improved but remains elevated, 262 today (06/13/22)    PLAN: -proceed with C16 FOLFIRINOX at same reduced dose             -Udenyca will be rescheduled to next week if ANC<0.9) -I refilled tramadol, Xanax, and ambien. -lab, flush, f/u, and  FOLFIRINOX in 3 weeks              -he prefers Thursday morning appointments   No problem-specific Assessment & Plan notes found for this encounter.   SUMMARY OF ONCOLOGIC HISTORY: Oncology History Overview Note   Cancer Staging  Pancreatic adenocarcinoma [facility]) Staging form: Exocrine Pancreas, AJCC 8th  Edition - Clinical stage from 07/26/2021: Stage IV (cT4, cN0, cM1) - Signed by [name], MD on 07/28/2021    Pancreatic adenocarcinoma ([place])  07/02/2021 Initial Diagnosis   Pancreatic adenocarcinoma ([place])   07/26/2021 Cancer Staging   Staging form: Exocrine Pancreas, AJCC 8th Edition - Clinical stage from 07/26/2021: Stage IV (cT4, cN0, cM1) - Signed by [name], MD on 07/28/2021 Stage prefix: Initial diagnosis Total positive nodes: 0   08/13/2021 -  Chemotherapy   Patient is on Treatment Plan : PANCREAS Modified FOLFIRINOX q14d x 4 cycles      Genetic Testing   [facility] CancerNext-Expanded results (77 genes) were negative. No pathogenic variants were identified. 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 The report date is 08/15/2021.    The CancerNext-Expanded gene panel offered by [facility] and includes sequencing, rearrangement, and RNA analysis for the following 77 genes: AIP, ALK, APC, ATM, AXIN2, BAP1, BARD1, BLM, BMPR1A, BRCA1, BRCA2, BRIP1, CDC73, CDH1, CDK4, CDKN1B, CDKN2A, CHEK2, CTNNA1, DICER1, FANCC, FH, FLCN, GALNT12, KIF1B, LZTR1, MAX, MEN1, MET, MLH1, MSH2, MSH3, MSH6, MUTYH, NBN, NF1, NF2, NTHL1, PALB2, PHOX2B, PMS2, POT1, PRKAR1A, PTCH1, PTEN, RAD51C, RAD51D, RB1, RECQL, RET, SDHA, SDHAF2, SDHB, SDHC, SDHD, SMAD4, SMARCA4, SMARCB1, SMARCE1, STK11, SUFU, TMEM127, TP53, TSC1, TSC2, VHL and XRCC2 (sequencing and deletion/duplication); EGFR, EGLN1, HOXB13, KIT, MITF, PDGFRA, POLD1, and POLE (sequencing only); EPCAM and GREM1 (deletion/duplication only).     10/25/2021 Imaging   EXAM: CT CHEST, ABDOMEN, AND PELVIS WITH CONTRAST  IMPRESSION: 1. Innumerable bilateral pulmonary nodules, many of which are cavitary, consistent with metastatic disease. These nodules show no substantial change in are minimally progressed in the interval. 2. Mix cystic and solid lesion in the head and body of the pancreas is similar to prior and also comparing back to MRI  07/02/2021. 3. Hepatic cysts. 4. 8 mm nonobstructing left renal stone. 5. Aortic Atherosclerosis (ICD10-I70.0).   01/28/2022 Imaging   EXAM: CT CHEST, ABDOMEN, AND PELVIS WITH CONTRAST  IMPRESSION: 1. Innumerable bilateral small solid and cavitary pulmonary nodules, some of the cavitary nodules are slightly less thick-walled when compared with prior exam. 2. Mixed cystic and solid lesion in the head and body of the pancreas is similar to prior exam. 3. Nonobstructing left renal stone. 4.  Aortic Atherosclerosis (ICD10-I70.0).      INTERVAL HISTORY:  Zachary Reid is here for a follow up of metastatic pancreatic cancer. He was last seen by me on 05/16/22. He was seen in the infusion area. He reports he greatly enjoyed his vacation.   All other systems were reviewed with the patient and are negative.  MEDICAL HISTORY:  Past Medical History:  Diagnosis Date   Abnormal EKG    DM (diabetes mellitus) ([place])    Hyperlipidemia    Hypogonadism male    IBS (irritable bowel syndrome)    Obesity    BMI 31   pancreatic ca 06/28/2021   Vitamin D deficiency     SURGICAL HISTORY: Past Surgical History:  Procedure Laterality Date   APPENDECTOMY     BIOPSY  07/26/2021   Procedure: BIOPSY;  Surgeon: Irving Copas., MD;  Location: [facility];  Service: Gastroenterology;;   COLONOSCOPY WITH PROPOFOL N/A 07/26/2021   Procedure: COLONOSCOPY WITH PROPOFOL;  Surgeon: Rush Landmark Telford Nab., MD;  Location: [place];  Service: Gastroenterology;  Laterality: N/A;   ESOPHAGOGASTRODUODENOSCOPY (EGD) WITH PROPOFOL N/A 07/26/2021   Procedure: ESOPHAGOGASTRODUODENOSCOPY (EGD) WITH PROPOFOL;  Surgeon: Rush Landmark Telford Nab., MD;  Location: [place];  Service: Gastroenterology;  Laterality: N/A;   EUS N/A 07/26/2021   Procedure: UPPER ENDOSCOPIC ULTRASOUND (EUS) RADIAL;  Surgeon: Irving Copas., MD;  Location: [place];  Service: Gastroenterology;  Laterality: N/A;   FINE  NEEDLE ASPIRATION  07/26/2021   Procedure: FINE NEEDLE ASPIRATION (FNA) LINEAR;  Surgeon: Rush Landmark Telford Nab., MD;  Location: [place];  Service: Gastroenterology;;   IR IMAGING GUIDED PORT INSERTION  08/10/2021   POLYPECTOMY  07/26/2021   Procedure: POLYPECTOMY;  Surgeon: Rush Landmark Telford Nab., MD;  Location: [place];  Service: Gastroenterology;;   SHOULDER SURGERY Right     I have reviewed the social history and family history with the patient and they are unchanged from previous note.  ALLERGIES:  is allergic to lipitor [atorvastatin].  MEDICATIONS:  Current Outpatient Medications  Medication Sig Dispense Refill   ALPRAZolam (XANAX) 0.25 MG tablet Take 1-2 tablets (0.25-0.5 mg total) by mouth 2 (two) times daily as needed for anxiety. 60 tablet 0   aspirin EC 81 MG tablet Take 81 mg by mouth in the morning and at bedtime. Swallow whole.     Cholecalciferol (VITAMIN D3) 125 MCG (5000 UT) CAPS Take 5,000 Units by mouth in the morning and at bedtime.     gabapentin (NEURONTIN) 600 MG tablet Take 1 tablet (600 mg total) by mouth 3 (three) times daily. 90 tablet 1   glucose blood test strip Use as instructed 100 each 12   hydrocortisone (ANUSOL-HC) 25 MG suppository Place 1 suppository (25 mg total) rectally at bedtime. QHS x 1-week and then Every Other night until prescription complete (Patient not taking: Reported on 05/22/2022) 12 suppository 0   hyoscyamine (LEVSIN SL) 0.125 MG SL tablet DISSOLVE ONE TABLET UNDER THE TONGUE 4 TIMES DAILY UP TO EVERY 4 HOURS AS NEEDED FOR NAUSEA, BLOATING, CRAMPING, OR DIARRHEA 100 tablet 0   insulin degludec (TRESIBA FLEXTOUCH) 100 UNIT/ML FlexTouch Pen Inject 30 units every morning and adjust as directed, maximum daily dose 50 units 15 mL 1   insulin lispro (HUMALOG KWIKPEN) 100 UNIT/ML KwikPen 10 to 15 units before meals as directed 15 mL 1   lidocaine-prilocaine (EMLA) cream Apply to affected area once (Patient not taking: Reported on 05/22/2022)  30 g 3   ondansetron (ZOFRAN ODT) 4 MG disintegrating tablet Take 1 tablet (4 mg total) by mouth every 8 (eight) hours as needed for nausea or vomiting. (Patient not taking: Reported on 05/22/2022) 20 tablet 0   ondansetron (ZOFRAN) 8 MG tablet Take 1 tablet (8 mg total) by mouth 2 (two) times daily as needed. Start on day 3 after chemotherapy. 30 tablet 1   oxyCODONE (ROXICODONE) 5 MG immediate release tablet Take 1 tablet (5 mg total) by mouth every 6 (six) hours as needed for up to 6 doses for severe pain. 30 tablet 0   prochlorperazine (COMPAZINE) 10 MG tablet Take 1 tablet (10 mg total) by mouth every 6 (six) hours as needed (Nausea or vomiting). 30 tablet 1   traMADol (ULTRAM) 50 MG tablet Take 1 tablet (50 mg total) by mouth every 6 (six) hours as needed. 80 tablet 0   zolpidem (AMBIEN) 10  MG tablet Take 1 tablet (10 mg total) by mouth at bedtime as needed for sleep. 30 tablet 0   No current facility-administered medications for this visit.   Facility-Administered Medications Ordered in Other Visits  Medication Dose Route Frequency Provider Last Rate Last Admin   fluorouracil (ADRUCIL) 3,800 mg in sodium chloride 0.9 % 74 mL chemo infusion  2,000 mg/m2 (Treatment Plan Recorded) Intravenous 1 day or 1 dose [name], MD   Infusion Verify at 06/13/22 1652   heparin lock flush 100 unit/mL  500 Units Intracatheter Once PRN [name], MD       sodium chloride flush (NS) 0.9 % injection 10 mL  10 mL Intracatheter PRN [name], MD   10 mL at 06/13/22 1513    PHYSICAL EXAMINATION: ECOG PERFORMANCE STATUS: 1 - Symptomatic but completely ambulatory  There were no vitals filed for this visit. Wt Readings from Last 3 Encounters:  06/13/22 174 lb 8 oz (79.2 kg)  05/22/22 159 lb 6.4 oz (72.3 kg)  05/16/22 165 lb 3.2 oz (74.9 kg)     GENERAL:alert, no distress and comfortable SKIN: skin color normal, no rashes or significant lesions EYES: normal, Conjunctiva are pink and non-injected, sclera  clear  NEURO: alert & oriented x 3 with fluent speech  LABORATORY DATA:  I have reviewed the data as listed    Latest Ref Rng & Units 06/13/2022    8:54 AM 05/14/2022    9:30 AM 04/25/2022    8:19 AM  CBC  WBC 4.0 - 10.5 K/uL 6.2  7.1  6.2   Hemoglobin 13.0 - 17.0 g/dL 12.5  12.5  13.6   Hematocrit 39.0 - 52.0 % 36.8  35.2  38.6   Platelets 150 - 400 K/uL 108  97  97         Latest Ref Rng & Units 06/13/2022    8:54 AM 05/14/2022    9:30 AM 04/25/2022    8:19 AM  CMP  Glucose 70 - 99 mg/dL 262  329  328   BUN 8 - 23 mg/dL $Remove'17  15  11   'qGBnndc$ Creatinine 0.61 - 1.24 mg/dL 0.94  0.96  0.90   Sodium 135 - 145 mmol/L 139  134  136   Potassium 3.5 - 5.1 mmol/L 3.8  4.2  3.8   Chloride 98 - 111 mmol/L 108  102  103   CO2 22 - 32 mmol/L $RemoveB'24  27  25   'XokbbYBk$ Calcium 8.9 - 10.3 mg/dL 8.8  9.5  9.7   Total Protein 6.5 - 8.1 g/dL 6.6  6.6  7.1   Total Bilirubin 0.3 - 1.2 mg/dL 0.5  0.6  0.5   Alkaline Phos 38 - 126 U/L 101  128  130   AST 15 - 41 U/L $Remo'19  17  20   'PQXOn$ ALT 0 - 44 U/L $Remo'19  19  23       'qrzdU$ RADIOGRAPHIC STUDIES: I have personally reviewed the radiological images as listed and agreed with the findings in the report. No results found.    No orders of the defined types were placed in this encounter.  All questions were answered. The patient knows to call the clinic with any problems, questions or concerns. No barriers to learning was detected. The total time spent in the appointment was 30 minutes.     [name], MD 06/13/2022   I, Wilburn Mylar, am acting as scribe for [name], MD.   I have reviewed the  above documentation for accuracy and completeness, and I agree with the above.     

## 2022-06-14 LAB — CANCER ANTIGEN 19-9: CA 19-9: 177 U/mL — ABNORMAL HIGH (ref 0–35)

## 2022-06-15 ENCOUNTER — Inpatient Hospital Stay: Payer: Medicare Other

## 2022-06-15 VITALS — BP 118/71 | HR 75 | Temp 98.1°F | Resp 16

## 2022-06-15 DIAGNOSIS — C259 Malignant neoplasm of pancreas, unspecified: Secondary | ICD-10-CM

## 2022-06-15 DIAGNOSIS — Z5111 Encounter for antineoplastic chemotherapy: Secondary | ICD-10-CM | POA: Diagnosis not present

## 2022-06-15 MED ORDER — SODIUM CHLORIDE 0.9% FLUSH
10.0000 mL | INTRAVENOUS | Status: DC | PRN
  Administered 2022-06-15: 10 mL

## 2022-06-15 MED ORDER — HEPARIN SOD (PORK) LOCK FLUSH 100 UNIT/ML IV SOLN
500.0000 [IU] | Freq: Once | INTRAVENOUS | Status: AC | PRN
  Administered 2022-06-15: 500 [IU]

## 2022-06-18 ENCOUNTER — Encounter: Payer: Self-pay | Admitting: Hematology

## 2022-06-18 ENCOUNTER — Telehealth: Payer: Self-pay | Admitting: Hematology

## 2022-06-18 ENCOUNTER — Other Ambulatory Visit: Payer: Self-pay

## 2022-06-18 NOTE — Telephone Encounter (Signed)
Scheduled follow-up appointments per 7/27 los. Patient is aware. 

## 2022-06-20 ENCOUNTER — Inpatient Hospital Stay: Payer: Medicare Other

## 2022-06-24 ENCOUNTER — Inpatient Hospital Stay: Payer: Medicare Other | Attending: Hematology

## 2022-06-24 ENCOUNTER — Inpatient Hospital Stay: Payer: Medicare Other

## 2022-06-24 ENCOUNTER — Other Ambulatory Visit: Payer: Self-pay

## 2022-06-24 VITALS — BP 103/72 | HR 74 | Resp 18

## 2022-06-24 DIAGNOSIS — G4700 Insomnia, unspecified: Secondary | ICD-10-CM | POA: Insufficient documentation

## 2022-06-24 DIAGNOSIS — C251 Malignant neoplasm of body of pancreas: Secondary | ICD-10-CM | POA: Insufficient documentation

## 2022-06-24 DIAGNOSIS — C259 Malignant neoplasm of pancreas, unspecified: Secondary | ICD-10-CM

## 2022-06-24 DIAGNOSIS — E669 Obesity, unspecified: Secondary | ICD-10-CM | POA: Insufficient documentation

## 2022-06-24 DIAGNOSIS — N200 Calculus of kidney: Secondary | ICD-10-CM | POA: Insufficient documentation

## 2022-06-24 DIAGNOSIS — Z5111 Encounter for antineoplastic chemotherapy: Secondary | ICD-10-CM | POA: Diagnosis present

## 2022-06-24 DIAGNOSIS — E1140 Type 2 diabetes mellitus with diabetic neuropathy, unspecified: Secondary | ICD-10-CM | POA: Diagnosis not present

## 2022-06-24 DIAGNOSIS — K7689 Other specified diseases of liver: Secondary | ICD-10-CM | POA: Insufficient documentation

## 2022-06-24 DIAGNOSIS — D696 Thrombocytopenia, unspecified: Secondary | ICD-10-CM | POA: Insufficient documentation

## 2022-06-24 DIAGNOSIS — I700 Atherosclerosis of aorta: Secondary | ICD-10-CM | POA: Diagnosis not present

## 2022-06-24 DIAGNOSIS — K589 Irritable bowel syndrome without diarrhea: Secondary | ICD-10-CM | POA: Diagnosis not present

## 2022-06-24 DIAGNOSIS — E119 Type 2 diabetes mellitus without complications: Secondary | ICD-10-CM | POA: Diagnosis not present

## 2022-06-24 DIAGNOSIS — R918 Other nonspecific abnormal finding of lung field: Secondary | ICD-10-CM | POA: Insufficient documentation

## 2022-06-24 DIAGNOSIS — Z794 Long term (current) use of insulin: Secondary | ICD-10-CM | POA: Insufficient documentation

## 2022-06-24 DIAGNOSIS — R5383 Other fatigue: Secondary | ICD-10-CM | POA: Diagnosis not present

## 2022-06-24 DIAGNOSIS — G8929 Other chronic pain: Secondary | ICD-10-CM | POA: Diagnosis not present

## 2022-06-24 DIAGNOSIS — F419 Anxiety disorder, unspecified: Secondary | ICD-10-CM | POA: Diagnosis not present

## 2022-06-24 DIAGNOSIS — Z8042 Family history of malignant neoplasm of prostate: Secondary | ICD-10-CM | POA: Insufficient documentation

## 2022-06-24 DIAGNOSIS — Z7982 Long term (current) use of aspirin: Secondary | ICD-10-CM | POA: Diagnosis not present

## 2022-06-24 DIAGNOSIS — E785 Hyperlipidemia, unspecified: Secondary | ICD-10-CM | POA: Insufficient documentation

## 2022-06-24 DIAGNOSIS — Z95828 Presence of other vascular implants and grafts: Secondary | ICD-10-CM

## 2022-06-24 DIAGNOSIS — E860 Dehydration: Secondary | ICD-10-CM

## 2022-06-24 DIAGNOSIS — Z79899 Other long term (current) drug therapy: Secondary | ICD-10-CM | POA: Insufficient documentation

## 2022-06-24 DIAGNOSIS — Z800 Family history of malignant neoplasm of digestive organs: Secondary | ICD-10-CM | POA: Insufficient documentation

## 2022-06-24 LAB — CMP (CANCER CENTER ONLY)
ALT: 19 U/L (ref 0–44)
AST: 18 U/L (ref 15–41)
Albumin: 3.9 g/dL (ref 3.5–5.0)
Alkaline Phosphatase: 92 U/L (ref 38–126)
Anion gap: 4 — ABNORMAL LOW (ref 5–15)
BUN: 15 mg/dL (ref 8–23)
CO2: 25 mmol/L (ref 22–32)
Calcium: 8.6 mg/dL — ABNORMAL LOW (ref 8.9–10.3)
Chloride: 110 mmol/L (ref 98–111)
Creatinine: 0.91 mg/dL (ref 0.61–1.24)
GFR, Estimated: >60 mL/min (ref >60)
Glucose, Bld: 176 mg/dL — ABNORMAL HIGH (ref 70–99)
Potassium: 3.8 mmol/L (ref 3.5–5.1)
Sodium: 139 mmol/L (ref 135–145)
Total Bilirubin: 0.6 mg/dL (ref 0.3–1.2)
Total Protein: 6.6 g/dL (ref 6.5–8.1)

## 2022-06-24 LAB — CBC WITH DIFFERENTIAL (CANCER CENTER ONLY)
Abs Immature Granulocytes: 0.04 10*3/uL (ref 0.00–0.07)
Basophils Absolute: 0 10*3/uL (ref 0.0–0.1)
Basophils Relative: 1 %
Eosinophils Absolute: 0.2 10*3/uL (ref 0.0–0.5)
Eosinophils Relative: 4 %
HCT: 34.8 % — ABNORMAL LOW (ref 39.0–52.0)
Hemoglobin: 12.1 g/dL — ABNORMAL LOW (ref 13.0–17.0)
Immature Granulocytes: 1 %
Lymphocytes Relative: 21 %
Lymphs Abs: 1.2 10*3/uL (ref 0.7–4.0)
MCH: 33.6 pg (ref 26.0–34.0)
MCHC: 34.8 g/dL (ref 30.0–36.0)
MCV: 96.7 fL (ref 80.0–100.0)
Monocytes Absolute: 0.5 10*3/uL (ref 0.1–1.0)
Monocytes Relative: 9 %
Neutro Abs: 3.9 10*3/uL (ref 1.7–7.7)
Neutrophils Relative %: 64 %
Platelet Count: 93 10*3/uL — ABNORMAL LOW (ref 150–400)
RBC: 3.6 MIL/uL — ABNORMAL LOW (ref 4.22–5.81)
RDW: 12.7 % (ref 11.5–15.5)
WBC Count: 5.9 10*3/uL (ref 4.0–10.5)
nRBC: 0 % (ref 0.0–0.2)

## 2022-06-24 MED ORDER — PEGFILGRASTIM-CBQV 6 MG/0.6ML ~~LOC~~ SOSY: 6.0000 mg | PREFILLED_SYRINGE | Freq: Once | SUBCUTANEOUS | Status: DC

## 2022-06-24 MED ORDER — HEPARIN SOD (PORK) LOCK FLUSH 100 UNIT/ML IV SOLN
500.0000 [IU] | Freq: Once | INTRAVENOUS | Status: AC
  Administered 2022-06-24: 500 [IU]

## 2022-06-24 MED ORDER — SODIUM CHLORIDE 0.9% FLUSH
10.0000 mL | Freq: Once | INTRAVENOUS | Status: AC
  Administered 2022-06-24: 10 mL

## 2022-06-24 NOTE — Addendum Note (Signed)
Addended by: Dicie Beam D on: 06/24/2022 11:24 AM   Modules accepted: Orders

## 2022-07-03 MED FILL — Fosaprepitant Dimeglumine For IV Infusion 150 MG (Base Eq): INTRAVENOUS | Qty: 5 | Status: AC

## 2022-07-04 ENCOUNTER — Inpatient Hospital Stay (HOSPITAL_BASED_OUTPATIENT_CLINIC_OR_DEPARTMENT_OTHER): Payer: Medicare Other | Admitting: Hematology

## 2022-07-04 ENCOUNTER — Inpatient Hospital Stay: Payer: Medicare Other

## 2022-07-04 ENCOUNTER — Other Ambulatory Visit: Payer: Self-pay

## 2022-07-04 ENCOUNTER — Encounter: Payer: Self-pay | Admitting: Hematology

## 2022-07-04 VITALS — BP 133/70 | HR 70 | Temp 97.5°F | Resp 18 | Wt 178.8 lb

## 2022-07-04 DIAGNOSIS — Z95828 Presence of other vascular implants and grafts: Secondary | ICD-10-CM

## 2022-07-04 DIAGNOSIS — C259 Malignant neoplasm of pancreas, unspecified: Secondary | ICD-10-CM

## 2022-07-04 DIAGNOSIS — Z5111 Encounter for antineoplastic chemotherapy: Secondary | ICD-10-CM | POA: Diagnosis not present

## 2022-07-04 DIAGNOSIS — E860 Dehydration: Secondary | ICD-10-CM

## 2022-07-04 LAB — CBC WITH DIFFERENTIAL (CANCER CENTER ONLY)
Abs Immature Granulocytes: 0 10*3/uL (ref 0.00–0.07)
Basophils Absolute: 0 10*3/uL (ref 0.0–0.1)
Basophils Relative: 1 %
Eosinophils Absolute: 0.1 10*3/uL (ref 0.0–0.5)
Eosinophils Relative: 3 %
HCT: 35 % — ABNORMAL LOW (ref 39.0–52.0)
Hemoglobin: 12.1 g/dL — ABNORMAL LOW (ref 13.0–17.0)
Immature Granulocytes: 0 %
Lymphocytes Relative: 23 %
Lymphs Abs: 0.8 10*3/uL (ref 0.7–4.0)
MCH: 33.8 pg (ref 26.0–34.0)
MCHC: 34.6 g/dL (ref 30.0–36.0)
MCV: 97.8 fL (ref 80.0–100.0)
Monocytes Absolute: 0.4 10*3/uL (ref 0.1–1.0)
Monocytes Relative: 11 %
Neutro Abs: 2.2 10*3/uL (ref 1.7–7.7)
Neutrophils Relative %: 62 %
Platelet Count: 83 10*3/uL — ABNORMAL LOW (ref 150–400)
RBC: 3.58 MIL/uL — ABNORMAL LOW (ref 4.22–5.81)
RDW: 12.7 % (ref 11.5–15.5)
WBC Count: 3.5 10*3/uL — ABNORMAL LOW (ref 4.0–10.5)
nRBC: 0 % (ref 0.0–0.2)

## 2022-07-04 LAB — CMP (CANCER CENTER ONLY)
ALT: 22 U/L (ref 0–44)
AST: 22 U/L (ref 15–41)
Albumin: 4 g/dL (ref 3.5–5.0)
Alkaline Phosphatase: 97 U/L (ref 38–126)
Anion gap: 5 (ref 5–15)
BUN: 18 mg/dL (ref 8–23)
CO2: 27 mmol/L (ref 22–32)
Calcium: 9.4 mg/dL (ref 8.9–10.3)
Chloride: 107 mmol/L (ref 98–111)
Creatinine: 0.88 mg/dL (ref 0.61–1.24)
GFR, Estimated: >60 mL/min (ref >60)
Glucose, Bld: 224 mg/dL — ABNORMAL HIGH (ref 70–99)
Potassium: 4.2 mmol/L (ref 3.5–5.1)
Sodium: 139 mmol/L (ref 135–145)
Total Bilirubin: 0.4 mg/dL (ref 0.3–1.2)
Total Protein: 6.9 g/dL (ref 6.5–8.1)

## 2022-07-04 MED ORDER — SODIUM CHLORIDE 0.9 % IV SOLN
150.0000 mg/m2 | Freq: Once | INTRAVENOUS | Status: AC
  Administered 2022-07-04: 280 mg via INTRAVENOUS
  Filled 2022-07-04: qty 14

## 2022-07-04 MED ORDER — SODIUM CHLORIDE 0.9 % IV SOLN
150.0000 mg | Freq: Once | INTRAVENOUS | Status: AC
  Administered 2022-07-04: 150 mg via INTRAVENOUS
  Filled 2022-07-04: qty 150

## 2022-07-04 MED ORDER — SODIUM CHLORIDE 0.9 % IV SOLN: Freq: Once | INTRAVENOUS | Status: AC

## 2022-07-04 MED ORDER — SODIUM CHLORIDE 0.9 % IV SOLN
400.0000 mg/m2 | Freq: Once | INTRAVENOUS | Status: AC
  Administered 2022-07-04: 764 mg via INTRAVENOUS
  Filled 2022-07-04: qty 25

## 2022-07-04 MED ORDER — SODIUM CHLORIDE 0.9% FLUSH
10.0000 mL | Freq: Once | INTRAVENOUS | Status: AC
  Administered 2022-07-04: 10 mL

## 2022-07-04 MED ORDER — DEXTROSE 5 % IV SOLN: Freq: Once | INTRAVENOUS | Status: AC

## 2022-07-04 MED ORDER — PALONOSETRON HCL INJECTION 0.25 MG/5ML
0.2500 mg | Freq: Once | INTRAVENOUS | Status: AC
  Administered 2022-07-04: 0.25 mg via INTRAVENOUS
  Filled 2022-07-04: qty 5

## 2022-07-04 MED ORDER — OXALIPLATIN CHEMO INJECTION 100 MG/20ML
30.0000 mg/m2 | Freq: Once | INTRAVENOUS | Status: AC
  Administered 2022-07-04: 55 mg via INTRAVENOUS
  Filled 2022-07-04: qty 11

## 2022-07-04 MED ORDER — SODIUM CHLORIDE 0.9 % IV SOLN
1800.0000 mg/m2 | INTRAVENOUS | Status: DC
  Administered 2022-07-04: 3450 mg via INTRAVENOUS
  Filled 2022-07-04: qty 69

## 2022-07-04 MED ORDER — PROCHLORPERAZINE EDISYLATE 10 MG/2ML IJ SOLN
10.0000 mg | Freq: Once | INTRAMUSCULAR | Status: AC
  Administered 2022-07-04: 10 mg via INTRAVENOUS
  Filled 2022-07-04: qty 2

## 2022-07-04 MED ORDER — ATROPINE SULFATE 1 MG/ML IV SOLN
0.5000 mg | Freq: Once | INTRAVENOUS | Status: AC | PRN
  Administered 2022-07-04: 0.5 mg via INTRAVENOUS
  Filled 2022-07-04: qty 1

## 2022-07-04 MED ORDER — DEXAMETHASONE SODIUM PHOSPHATE 10 MG/ML IJ SOLN
6.0000 mg | Freq: Once | INTRAMUSCULAR | Status: AC
  Administered 2022-07-04: 6 mg via INTRAVENOUS
  Filled 2022-07-04: qty 1

## 2022-07-04 NOTE — Progress Notes (Signed)
Per Dr. Burr Medico, okay to treat with platelets 83, 5-FU dose to be reduced.

## 2022-07-04 NOTE — Patient Instructions (Signed)
[facility]  Discharge Instructions: Thank you for choosing [facility] to provide your oncology and hematology care.   If you have a lab appointment with the [facility], please go directly to the [facility] and check in at the registration area.   Wear comfortable clothing and clothing appropriate for easy access to any Portacath or PICC line.   We strive to give you quality time with your provider. You may need to reschedule your appointment if you arrive late (15 or more minutes).  Arriving late affects you and other patients whose appointments are after yours.  Also, if you miss three or more appointments without notifying the office, you may be dismissed from the clinic at the provider's discretion.      For prescription refill requests, have your pharmacy contact our office and allow 72 hours for refills to be completed.    Today you received the following chemotherapy and/or immunotherapy agents: oxaliplatin, irinotecan, leucovorin, fluorouracil       To help prevent nausea and vomiting after your treatment, we encourage you to take your nausea medication as directed.  BELOW ARE SYMPTOMS THAT SHOULD BE REPORTED IMMEDIATELY: *FEVER GREATER THAN 100.4 F (38 C) OR HIGHER *CHILLS OR SWEATING *NAUSEA AND VOMITING THAT IS NOT CONTROLLED WITH YOUR NAUSEA MEDICATION *UNUSUAL SHORTNESS OF BREATH *UNUSUAL BRUISING OR BLEEDING *URINARY PROBLEMS (pain or burning when urinating, or frequent urination) *BOWEL PROBLEMS (unusual diarrhea, constipation, pain near the anus) TENDERNESS IN MOUTH AND THROAT WITH OR WITHOUT PRESENCE OF ULCERS (sore throat, sores in mouth, or a toothache) UNUSUAL RASH, SWELLING OR PAIN  UNUSUAL VAGINAL DISCHARGE OR ITCHING   Items with * indicate a potential emergency and should be followed up as soon as possible or go to the Emergency Department if any problems should occur.  Please show the CHEMOTHERAPY ALERT CARD or  IMMUNOTHERAPY ALERT CARD at check-in to the Emergency Department and triage nurse.  Should you have questions after your visit or need to cancel or reschedule your appointment, please contact [facility]  Dept: [phone]  and follow the prompts.  Office hours are 8:00 a.m. to 4:30 p.m. Monday - Friday. Please note that voicemails left after 4:00 p.m. may not be returned until the following business day.  We are closed weekends and major holidays. You have access to a nurse at all times for urgent questions. Please call the main number to the clinic Dept: [phone] and follow the prompts.   For any non-urgent questions, you may also contact your provider using MyChart. We now offer e-Visits for anyone 61 and older to request care online for non-urgent symptoms. For details visit [email or web address].   Also download the MyChart app! Go to the app store, search "MyChart", open the app, select [facility], and log in with your MyChart username and password.  Masks are optional in the cancer centers. If you would like for your care team to wear a mask while they are taking care of you, please let them know. For doctor visits, patients may have with them one support Eara Burruel who is at least 66 years old. At this time, visitors are not allowed in the infusion area.

## 2022-07-04 NOTE — Progress Notes (Signed)
Zachary Reid   [phone] [phone]   Clinic Follow up Note   Patient Care Team: [name], MD as PCP - General (Internal Medicine) [name], NP as PCP - Hematology/Oncology (Nurse Practitioner) [name], MD as Consulting Physician (Orthopedic Surgery) [name], MD as Consulting Physician (Cardiology) [name], MD as Consulting Physician (Oncology) Royston Bake, RN as Oncology Nurse Navigator (Oncology)  Date of Service:  07/04/2022  CHIEF COMPLAINT: f/u of metastatic pancreatic cancer  CURRENT THERAPY:  First line FOLFIRINOX, starting 08/13/21, now q3weeks  ASSESSMENT & PLAN:  Zachary Reid is a 66 y.o. male with   1. Metastatic pancreatic adenocarcinoma, [id number] with numerous small cavitary lesions in bilateral lungs, MMR normal  -found incidentally on recent CT scan for left kidney stone. He also had nonspecific symptoms, including epigastric pain, chronic right flank pain, dry cough, and significant weight loss in past several years. -colonoscopy and EUS on 07/26/21 demonstrated a solid-cystic mass in the genu-body of the pancreas, with endosonographic evidence of SMA abutment, T4N0. Cytology confirmed adenocarcinoma.  -Baseline CA 19-9 elevated to 273 on 07/03/21 -He began first-line mFOLFIRINOX on 08/13/21, tolerated moderately well with severe fatigue for 3-7 days, constipation, and mild pancytopenia. He is able to recover well. GCSF added with cycle 2 -restaging CT CAP on 05/14/22 showed overall stable disease, unchanged pancreatic head mass or pulmonary nodules. No new disease.  -he is now receiving treatment every 3 weeks, but he still required further dose reduction due to cytopenias.  -his CA 19-9 has slightly trended up, most recently 177 on 06/13/22. -he reports he tolerated last treatment very well, which he attributes to not having GCF-S injection. Labs reviewed, WBC 3.5, plt 83k. Adequate to proceed with  treatment, I will reduce the dose slightlydue to his thrombocytopenia. -plan for restaging scan in 5-6 weeks   2. Symptom Management: abdominal pain, neuropathy -he also reports intermittent abdominal pain; he uses tramadol as needed, up to twice a day. 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 He is undergoing a form of light therapy, which he feels is helpful, and is on low-dose gabapentin.   3. Anxiety, Insomnia -anxiety began 2022. He did not tolerate lexapro and wellbutrin due to constipation. -currently on Xanax and Ambien (uses brand name) -he previously saw Dr. Michail Sermon, did not find this helpful and does not wish to f/u.   4. Genetic Testing -he reports pancreatic cancer in his mother, prostate cancer in his father, ovarian in PGM, and lung in PGF (smoker). -he proceeded with testing on 07/30/21. 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 DM -he is now followed by Dr. Loanne Drilling, now on metformin and glucotrol. -BG improved but remains elevated, 224 today (07/04/22)     PLAN: -proceed with C17 FOLFIRINOX with further dose reduction of 5-FU due to worsening thrombocytopenia -lab, flush, f/u, and FOLFIRINOX every 3 weeks              -he prefers Thursday morning appointments -restaging CT to be done in 5-6 weeks   No problem-specific Assessment & Plan notes found for this encounter.   SUMMARY OF ONCOLOGIC HISTORY: Oncology History Overview Note   Cancer Staging  Pancreatic adenocarcinoma [facility]) Staging form: Exocrine Pancreas, AJCC 8th Edition - Clinical stage from 07/26/2021: Stage IV (cT4, cN0, cM1) - Signed by [name], MD on 07/28/2021    Pancreatic adenocarcinoma ([place])  07/02/2021 Initial Diagnosis   Pancreatic adenocarcinoma ([place])   07/26/2021  Cancer Staging   Staging form: Exocrine Pancreas, AJCC 8th Edition - Clinical stage from 07/26/2021: Stage IV (cT4, cN0, cM1) - Signed by [name], MD on 07/28/2021 Stage prefix: Initial diagnosis Total positive nodes: 0    08/13/2021 -  Chemotherapy   Patient is on Treatment Plan : PANCREAS Modified FOLFIRINOX q14d x 4 cycles      Genetic Testing   [facility] CancerNext-Expanded results (77 genes) were negative. No pathogenic variants were identified. 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 The report date is 08/15/2021.    The CancerNext-Expanded gene panel offered by [facility] and includes sequencing, rearrangement, and RNA analysis for the following 77 genes: AIP, ALK, APC, ATM, AXIN2, BAP1, BARD1, BLM, BMPR1A, BRCA1, BRCA2, BRIP1, CDC73, CDH1, CDK4, CDKN1B, CDKN2A, CHEK2, CTNNA1, DICER1, FANCC, FH, FLCN, GALNT12, KIF1B, LZTR1, MAX, MEN1, MET, MLH1, MSH2, MSH3, MSH6, MUTYH, NBN, NF1, NF2, NTHL1, PALB2, PHOX2B, PMS2, POT1, PRKAR1A, PTCH1, PTEN, RAD51C, RAD51D, RB1, RECQL, RET, SDHA, SDHAF2, SDHB, SDHC, SDHD, SMAD4, SMARCA4, SMARCB1, SMARCE1, STK11, SUFU, TMEM127, TP53, TSC1, TSC2, VHL and XRCC2 (sequencing and deletion/duplication); EGFR, EGLN1, HOXB13, KIT, MITF, PDGFRA, POLD1, and POLE (sequencing only); EPCAM and GREM1 (deletion/duplication only).     10/25/2021 Imaging   EXAM: CT CHEST, ABDOMEN, AND PELVIS WITH CONTRAST  IMPRESSION: 1. Innumerable bilateral pulmonary nodules, many of which are cavitary, consistent with metastatic disease. These nodules show no substantial change in are minimally progressed in the interval. 2. Mix cystic and solid lesion in the head and body of the pancreas is similar to prior and also comparing back to MRI 07/02/2021. 3. Hepatic cysts. 4. 8 mm nonobstructing left renal stone. 5. Aortic Atherosclerosis (ICD10-I70.0).   01/28/2022 Imaging   EXAM: CT CHEST, ABDOMEN, AND PELVIS WITH CONTRAST  IMPRESSION: 1. Innumerable bilateral small solid and cavitary pulmonary nodules, some of the cavitary nodules are slightly less thick-walled when compared with prior exam. 2. Mixed cystic and solid lesion in the head and body of the pancreas is  similar to prior exam. 3. Nonobstructing left renal stone. 4.  Aortic Atherosclerosis (ICD10-I70.0).      INTERVAL HISTORY:  Zachary Reid is here for a follow up of metastatic pancreatic cancer. He was last seen by me on 06/13/22. He presents to the clinic accompanied by his wife. He reports he is feeling better without the GCF-S injection. He denies bleeding. He reports his neuropathy is "slight."   All other systems were reviewed with the patient and are negative.  MEDICAL HISTORY:  Past Medical History:  Diagnosis Date   Abnormal EKG    DM (diabetes mellitus) ([place])    Hyperlipidemia    Hypogonadism male    IBS (irritable bowel syndrome)    Obesity    BMI 31   pancreatic ca 06/28/2021   Vitamin D deficiency     SURGICAL HISTORY: Past Surgical History:  Procedure Laterality Date   APPENDECTOMY     BIOPSY  07/26/2021   Procedure: BIOPSY;  Surgeon: Irving Copas., MD;  Location: [place];  Service: Gastroenterology;;   COLONOSCOPY WITH PROPOFOL N/A 07/26/2021   Procedure: COLONOSCOPY WITH PROPOFOL;  Surgeon: Irving Copas., MD;  Location: [place];  Service: Gastroenterology;  Laterality: N/A;   ESOPHAGOGASTRODUODENOSCOPY (EGD) WITH PROPOFOL N/A 07/26/2021   Procedure: ESOPHAGOGASTRODUODENOSCOPY (EGD) WITH PROPOFOL;  Surgeon: Rush Landmark Telford Nab., MD;  Location: [place];  Service: Gastroenterology;  Laterality: N/A;   EUS N/A 07/26/2021   Procedure: UPPER ENDOSCOPIC ULTRASOUND (EUS) RADIAL;  Surgeon:  Mansouraty, Telford Nab., MD;  Location: [place];  Service: Gastroenterology;  Laterality: N/A;   FINE NEEDLE ASPIRATION  07/26/2021   Procedure: FINE NEEDLE ASPIRATION (FNA) LINEAR;  Surgeon: Rush Landmark Telford Nab., MD;  Location: [place];  Service: Gastroenterology;;   IR IMAGING GUIDED PORT INSERTION  08/10/2021   POLYPECTOMY  07/26/2021   Procedure: POLYPECTOMY;  Surgeon: Rush Landmark Telford Nab., MD;  Location: [place];  Service:  Gastroenterology;;   SHOULDER SURGERY Right     I have reviewed the social history and family history with the patient and they are unchanged from previous note.  ALLERGIES:  is allergic to lipitor [atorvastatin].  MEDICATIONS:  Current Outpatient Medications  Medication Sig Dispense Refill   ALPRAZolam (XANAX) 0.25 MG tablet Take 1-2 tablets (0.25-0.5 mg total) by mouth 2 (two) times daily as needed for anxiety. 60 tablet 0   aspirin EC 81 MG tablet Take 81 mg by mouth in the morning and at bedtime. Swallow whole.     Cholecalciferol (VITAMIN D3) 125 MCG (5000 UT) CAPS Take 5,000 Units by mouth in the morning and at bedtime.     gabapentin (NEURONTIN) 600 MG tablet Take 1 tablet (600 mg total) by mouth 3 (three) times daily. 90 tablet 1   glucose blood test strip Use as instructed 100 each 12   hydrocortisone (ANUSOL-HC) 25 MG suppository Place 1 suppository (25 mg total) rectally at bedtime. QHS x 1-week and then Every Other night until prescription complete (Patient not taking: Reported on 05/22/2022) 12 suppository 0   hyoscyamine (LEVSIN SL) 0.125 MG SL tablet DISSOLVE ONE TABLET UNDER THE TONGUE 4 TIMES DAILY UP TO EVERY 4 HOURS AS NEEDED FOR NAUSEA, BLOATING, CRAMPING, OR DIARRHEA 100 tablet 0   insulin degludec (TRESIBA FLEXTOUCH) 100 UNIT/ML FlexTouch Pen Inject 30 units every morning and adjust as directed, maximum daily dose 50 units 15 mL 1   insulin lispro (HUMALOG KWIKPEN) 100 UNIT/ML KwikPen 10 to 15 units before meals as directed 15 mL 1   lidocaine-prilocaine (EMLA) cream Apply to affected area once (Patient not taking: Reported on 05/22/2022) 30 g 3   ondansetron (ZOFRAN ODT) 4 MG disintegrating tablet Take 1 tablet (4 mg total) by mouth every 8 (eight) hours as needed for nausea or vomiting. (Patient not taking: Reported on 05/22/2022) 20 tablet 0   ondansetron (ZOFRAN) 8 MG tablet Take 1 tablet (8 mg total) by mouth 2 (two) times daily as needed. Start on day 3 after chemotherapy.  30 tablet 1   oxyCODONE (ROXICODONE) 5 MG immediate release tablet Take 1 tablet (5 mg total) by mouth every 6 (six) hours as needed for up to 6 doses for severe pain. 30 tablet 0   prochlorperazine (COMPAZINE) 10 MG tablet Take 1 tablet (10 mg total) by mouth every 6 (six) hours as needed (Nausea or vomiting). 30 tablet 1   traMADol (ULTRAM) 50 MG tablet Take 1 tablet (50 mg total) by mouth every 6 (six) hours as needed. 80 tablet 0   zolpidem (AMBIEN) 10 MG tablet Take 1 tablet (10 mg total) by mouth at bedtime as needed for sleep. 30 tablet 0   No current facility-administered medications for this visit.   Facility-Administered Medications Ordered in Other Visits  Medication Dose Route Frequency Provider Last Rate Last Admin   atropine injection 0.5 mg  0.5 mg Intravenous Once PRN [name], MD       fluorouracil (ADRUCIL) 3,450 mg in sodium chloride 0.9 % 81 mL chemo infusion  1,800 mg/m2 (Treatment Plan Recorded) Intravenous 1 day or 1 dose [name], MD       irinotecan (CAMPTOSAR) 280 mg in sodium chloride 0.9 % 500 mL chemo infusion  150 mg/m2 (Treatment Plan Recorded) Intravenous Once [name], MD       leucovorin 764 mg in sodium chloride 0.9 % 250 mL infusion  400 mg/m2 (Treatment Plan Recorded) Intravenous Once [name], MD       oxaliplatin (ELOXATIN) 55 mg in dextrose 5 % 500 mL chemo infusion  30 mg/m2 (Treatment Plan Recorded) Intravenous Once [name], MD       prochlorperazine (COMPAZINE) injection 10 mg  10 mg Intravenous Once [name], MD        PHYSICAL EXAMINATION: ECOG PERFORMANCE STATUS: 1 - Symptomatic but completely ambulatory  Vitals:   07/04/22 1018  BP: 133/70  Pulse: 70  Resp: 18  Temp: (!) 97.5 F (36.4 C)  SpO2: 100%   Wt Readings from Last 3 Encounters:  07/04/22 178 lb 12.8 oz (81.1 kg)  06/13/22 174 lb 8 oz (79.2 kg)  05/22/22 159 lb 6.4 oz (72.3 kg)     GENERAL:alert, no distress and comfortable SKIN: skin color normal, no rashes or  significant lesions EYES: normal, Conjunctiva are pink and non-injected, sclera clear  NEURO: alert & oriented x 3 with fluent speech  LABORATORY DATA:  I have reviewed the data as listed    Latest Ref Rng & Units 07/04/2022    9:33 AM 06/24/2022    9:01 AM 06/13/2022    8:54 AM  CBC  WBC 4.0 - 10.5 K/uL 3.5  5.9  6.2   Hemoglobin 13.0 - 17.0 g/dL 12.1  12.1  12.5   Hematocrit 39.0 - 52.0 % 35.0  34.8  36.8   Platelets 150 - 400 K/uL 83  93  108         Latest Ref Rng & Units 07/04/2022    9:33 AM 06/24/2022    9:01 AM 06/13/2022    8:54 AM  CMP  Glucose 70 - 99 mg/dL 224  176  262   BUN 8 - 23 mg/dL _0 Creatinine 0.61 - 1.24 mg/dL 0.88  0.91  0.94   Sodium 135 - 145 mmol/L 139  139  139   Potassium 3.5 - 5.1 mmol/L 4.2  3.8  3.8   Chloride 98 - 111 mmol/L 107  110  108   CO2 22 - 32 mmol/L _1 Calcium 8.9 - 10.3 mg/dL 9.4  8.6  8.8   Total Protein 6.5 - 8.1 g/dL 6.9  6.6  6.6   Total Bilirubin 0.3 - 1.2 mg/dL 0.4  0.6  0.5   Alkaline Phos 38 - 126 U/L 97  92  101   AST 15 - 41 U/L _2 ALT 0 - 44 U/L _3 RADIOGRAPHIC STUDIES: I have personally reviewed the radiological images as listed and agreed with the findings in the report. No results found.    Orders Placed This Encounter  Procedures   CT CHEST ABDOMEN PELVIS W CONTRAST    Standing Status:   Future    Standing Expiration Date:   07/05/2023    Order Specific Question:   Preferred imaging location?    Answer:   [facility]    Order Specific Question:  Release to patient    Answer:   Immediate    Order Specific Question:   Is Oral Contrast requested for this exam?    Answer:   Yes, Per Radiology protocol   All questions were answered. The patient knows to call the clinic with any problems, questions or concerns. No barriers to learning was detected. The total time spent in the appointment was 30 minutes.     [name], MD 07/04/2022   I, Wilburn Mylar, am  acting as scribe for [name], MD.   I have reviewed the above documentation for accuracy and completeness, and I agree with the above.

## 2022-07-06 ENCOUNTER — Inpatient Hospital Stay: Payer: Medicare Other

## 2022-07-06 VITALS — BP 128/88

## 2022-07-06 DIAGNOSIS — Z5111 Encounter for antineoplastic chemotherapy: Secondary | ICD-10-CM | POA: Diagnosis not present

## 2022-07-06 DIAGNOSIS — C259 Malignant neoplasm of pancreas, unspecified: Secondary | ICD-10-CM

## 2022-07-06 LAB — CANCER ANTIGEN 19-9: CA 19-9: 147 U/mL — ABNORMAL HIGH (ref 0–35)

## 2022-07-06 MED ORDER — SODIUM CHLORIDE 0.9% FLUSH
10.0000 mL | INTRAVENOUS | Status: DC | PRN
  Administered 2022-07-06: 10 mL

## 2022-07-06 MED ORDER — HEPARIN SOD (PORK) LOCK FLUSH 100 UNIT/ML IV SOLN
500.0000 [IU] | Freq: Once | INTRAVENOUS | Status: AC | PRN
  Administered 2022-07-06: 500 [IU]

## 2022-07-12 ENCOUNTER — Other Ambulatory Visit: Payer: Self-pay | Admitting: Hematology

## 2022-07-14 ENCOUNTER — Other Ambulatory Visit: Payer: Self-pay | Admitting: Nurse Practitioner

## 2022-07-14 MED ORDER — ZOLPIDEM TARTRATE 10 MG PO TABS: 10.0000 mg | ORAL_TABLET | Freq: Every evening | ORAL | 0 refills | Status: DC | PRN

## 2022-07-15 ENCOUNTER — Other Ambulatory Visit: Payer: Self-pay | Admitting: Endocrinology

## 2022-07-15 DIAGNOSIS — E1165 Type 2 diabetes mellitus with hyperglycemia: Secondary | ICD-10-CM

## 2022-07-16 ENCOUNTER — Other Ambulatory Visit (INDEPENDENT_AMBULATORY_CARE_PROVIDER_SITE_OTHER): Payer: Medicare Other

## 2022-07-16 DIAGNOSIS — Z794 Long term (current) use of insulin: Secondary | ICD-10-CM | POA: Diagnosis not present

## 2022-07-16 DIAGNOSIS — E1165 Type 2 diabetes mellitus with hyperglycemia: Secondary | ICD-10-CM

## 2022-07-16 LAB — LIPID PANEL
Cholesterol: 171 mg/dL (ref 0–200)
HDL: 54 mg/dL (ref >39.00)
LDL Cholesterol: 91 mg/dL (ref 0–99)
NonHDL: 117.17
Total CHOL/HDL Ratio: 3
Triglycerides: 129 mg/dL (ref 0.0–149.0)
VLDL: 25.8 mg/dL (ref 0.0–40.0)

## 2022-07-16 LAB — MICROALBUMIN / CREATININE URINE RATIO
Creatinine,U: 135.2 mg/dL
Microalb Creat Ratio: 1.2 mg/g (ref 0.0–30.0)
Microalb, Ur: 1.6 mg/dL (ref 0.0–1.9)

## 2022-07-16 LAB — HEMOGLOBIN A1C: Hgb A1c MFr Bld: 9.1 % — ABNORMAL HIGH (ref 4.6–6.5)

## 2022-07-16 LAB — GLUCOSE, RANDOM: Glucose, Bld: 77 mg/dL (ref 70–99)

## 2022-07-18 ENCOUNTER — Encounter: Payer: Self-pay | Admitting: Endocrinology

## 2022-07-18 ENCOUNTER — Ambulatory Visit (INDEPENDENT_AMBULATORY_CARE_PROVIDER_SITE_OTHER): Payer: Medicare Other | Admitting: Endocrinology

## 2022-07-18 VITALS — BP 92/58 | HR 86 | Ht 71.0 in | Wt 182.2 lb

## 2022-07-18 DIAGNOSIS — Z794 Long term (current) use of insulin: Secondary | ICD-10-CM

## 2022-07-18 DIAGNOSIS — E1165 Type 2 diabetes mellitus with hyperglycemia: Secondary | ICD-10-CM

## 2022-07-18 NOTE — Progress Notes (Signed)
Patient ID: Zachary Reid, male   DOB: Apr 26, 1956, 66 y.o.   MRN: [id number]           Reason for Appointment: Type II Diabetes follow-up   History of Present Illness   Diagnosis date: 2013  Previous history:  Non-insulin hypoglycemic drugs previously used: Glipizide, metformin, Ozempic Metformin was stopped in 4/23 when starting insulin Insulin was started in 2023  A1c range in the last few years is: 7.4-10% A1c has been higher since 2021  Recent history:     Non-insulin hypoglycemic drugs: None     Insulin regimen: 30 units of Tresiba in the morning    Humalog 12 units 3 times daily       Side effects from medications: None  Current self management, blood sugar patterns and problems identified:  A1c is 9.1 compared to 9.6 and usually over 9% recently He was told to take mealtime insulin in addition to [place] on the last visit and titrate the [place] He has gone up from 26 up to 30 units of Tresiba since his last visit However his blood sugars have been higher fasting readings since his infusion on 8/17 Blood sugar monitoring has been somewhat irregular and only in the mornings  He is however having a better appetite and gaining weight  Currently does not understand the timing of taking his mealtime insulin and frequently when he is not eating breakfast in the morning he is still taking the NovoLog Also especially when eating out in the evening he will not take his insulin with him  He did have a episode of hypoglycemia once when the took his morning insulin and did not eat till 2 PM Lab glucose 77 around 9 AM likely after taking insulin No recent consultations with dietitian for meal planning  Exercise: Inconsistent  Diet management: May not always restrict sweets     Hypoglycemia:  none    Glucometer: [employer] brand       Blood Glucose readings from review of recent home record:   Only fasting readings available, recent range 87-about 170 recently Weight  control:  Wt Readings from Last 3 Encounters:  07/18/22 182 lb 3.2 oz (82.6 kg)  07/04/22 178 lb 12.8 oz (81.1 kg)  06/13/22 174 lb 8 oz (79.2 kg)            Diabetes labs:  Lab Results  Component Value Date   HGBA1C 9.1 (H) 07/16/2022   HGBA1C 9.6 (A) 03/13/2022   HGBA1C 10.0 (A) 12/27/2021   Lab Results  Component Value Date   MICROALBUR 1.6 07/16/2022   LDLCALC 91 07/16/2022   CREATININE 0.88 07/04/2022     Allergies as of 07/18/2022       Reactions   Lipitor [atorvastatin]    fatigue        Medication List        Accurate as of July 18, 2022  1:56 PM. If you have any questions, ask your nurse or doctor.          ALPRAZolam 0.25 MG tablet Commonly known as: XANAX Take 1-2 tablets (0.25-0.5 mg total) by mouth 2 (two) times daily as needed for anxiety.   aspirin EC 81 MG tablet Take 81 mg by mouth in the morning and at bedtime. Swallow whole.   gabapentin 600 MG tablet Commonly known as: NEURONTIN Take 1 tablet (600 mg total) by mouth 3 (three) times daily.   glucose blood test strip Use as instructed   hydrocortisone 25 MG suppository  Commonly known as: ANUSOL-HC Place 1 suppository (25 mg total) rectally at bedtime. QHS x 1-week and then Every Other night until prescription complete   hyoscyamine 0.125 MG SL tablet Commonly known as: LEVSIN SL DISSOLVE ONE TABLET UNDER THE TONGUE 4 TIMES DAILY UP TO EVERY 4 HOURS AS NEEDED FOR NAUSEA, BLOATING, CRAMPING, OR DIARRHEA   insulin lispro 100 UNIT/ML KwikPen Commonly known as: HumaLOG KwikPen 10 to 15 units before meals as directed   lidocaine-prilocaine cream Commonly known as: EMLA Apply to affected area once   ondansetron 4 MG disintegrating tablet Commonly known as: Zofran ODT Take 1 tablet (4 mg total) by mouth every 8 (eight) hours as needed for nausea or vomiting.   ondansetron 8 MG tablet Commonly known as: Zofran Take 1 tablet (8 mg total) by mouth 2 (two) times daily as needed.  Start on day 3 after chemotherapy.   oxyCODONE 5 MG immediate release tablet Commonly known as: Roxicodone Take 1 tablet (5 mg total) by mouth every 6 (six) hours as needed for up to 6 doses for severe pain.   prochlorperazine 10 MG tablet Commonly known as: COMPAZINE Take 1 tablet (10 mg total) by mouth every 6 (six) hours as needed (Nausea or vomiting).   traMADol 50 MG tablet Commonly known as: ULTRAM Take 1 tablet (50 mg total) by mouth every 6 (six) hours as needed.   Tyler Aas FlexTouch 100 UNIT/ML FlexTouch Pen Generic drug: insulin degludec Inject 30 units every morning and adjust as directed, maximum daily dose 50 units   Vitamin D3 125 MCG (5000 UT) Caps Take 5,000 Units by mouth in the morning and at bedtime.   zolpidem 10 MG tablet Commonly known as: AMBIEN Take 1 tablet (10 mg total) by mouth at bedtime as needed for sleep.        Allergies:  Allergies  Allergen Reactions   Lipitor [Atorvastatin]     fatigue    Past Medical History:  Diagnosis Date   Abnormal EKG    DM (diabetes mellitus) ([place])    Hyperlipidemia    Hypogonadism male    IBS (irritable bowel syndrome)    Obesity    BMI 31   pancreatic ca 06/28/2021   Vitamin D deficiency     Past Surgical History:  Procedure Laterality Date   APPENDECTOMY     BIOPSY  07/26/2021   Procedure: BIOPSY;  Surgeon: Irving Copas., MD;  Location: [place];  Service: Gastroenterology;;   COLONOSCOPY WITH PROPOFOL N/A 07/26/2021   Procedure: COLONOSCOPY WITH PROPOFOL;  Surgeon: Irving Copas., MD;  Location: [place];  Service: Gastroenterology;  Laterality: N/A;   ESOPHAGOGASTRODUODENOSCOPY (EGD) WITH PROPOFOL N/A 07/26/2021   Procedure: ESOPHAGOGASTRODUODENOSCOPY (EGD) WITH PROPOFOL;  Surgeon: Rush Landmark Telford Nab., MD;  Location: [place];  Service: Gastroenterology;  Laterality: N/A;   EUS N/A 07/26/2021   Procedure: UPPER ENDOSCOPIC ULTRASOUND (EUS) RADIAL;  Surgeon: Irving Copas., MD;  Location: [place];  Service: Gastroenterology;  Laterality: N/A;   FINE NEEDLE ASPIRATION  07/26/2021   Procedure: FINE NEEDLE ASPIRATION (FNA) LINEAR;  Surgeon: Irving Copas., MD;  Location: [place];  Service: Gastroenterology;;   IR IMAGING GUIDED PORT INSERTION  08/10/2021   POLYPECTOMY  07/26/2021   Procedure: POLYPECTOMY;  Surgeon: Irving Copas., MD;  Location: [facility] ENDOSCOPY;  Service: Gastroenterology;;   SHOULDER SURGERY Right     Family History  Problem Relation Age of Onset   Pancreatic cancer Mother 78   Prostate cancer Father 44  Prostate   Diabetes Father    Hypertension Father    Drug abuse Brother    Kidney Stones Brother    Ovarian cancer Paternal Grandmother 41   Lung cancer Paternal Grandfather        Cigar smoker   Colon cancer Neg Hx    Colon polyps Neg Hx    Esophageal cancer Neg Hx    Stomach cancer Neg Hx    Rectal cancer Neg Hx     Social History:  reports that he has never smoked. He has never used smokeless tobacco. He reports that he does not drink alcohol and does not use drugs.  Review of Systems:  Last diabetic eye exam date 9/21  Last foot exam date: 2020  Symptoms of neuropathy: None  Hypertension: Not present  BP Readings from Last 3 Encounters:  07/18/22 (!) 92/58  07/06/22 128/88  07/04/22 133/70    Lipid management: Previously on simvastatin, stopped by PCP    Lab Results  Component Value Date   CHOL 171 07/16/2022   CHOL 157 06/05/2021   CHOL 195 03/05/2021   Lab Results  Component Value Date   HDL 54.00 07/16/2022   HDL 42 06/05/2021   HDL 40 03/05/2021   Lab Results  Component Value Date   LDLCALC 91 07/16/2022   LDLCALC 92 06/05/2021   LDLCALC 130 (H) 03/05/2021   Lab Results  Component Value Date   TRIG 129.0 07/16/2022   TRIG 133 06/05/2021   TRIG 132 03/05/2021   Lab Results  Component Value Date   CHOLHDL 3 07/16/2022   CHOLHDL 3.7 06/05/2021   CHOLHDL  4.9 03/05/2021   No results found for: "LDLDIRECT"   Examination:   BP (!) 92/58   Pulse 86   Ht '5\' 11"'$  (1.803 m)   Wt 182 lb 3.2 oz (82.6 kg)   SpO2 98%   BMI 25.41 kg/m   Body mass index is 25.41 kg/m.    ASSESSMENT/ PLAN:    Diabetes type 2:   Current regimen: Basal bolus insulin  See history of present illness for detailed discussion of current diabetes management, blood sugar patterns and problems identified  A1c is recently 9.1 %  Blood glucose control is recently improving with basal bolus insulin regimen However with only fasting readings available and not clear how his blood sugars are after meals Also has inadequate understanding of use of mealtime insulin and adjustment of the dose and day-to-day management around mealtimes Also unable to download his meter  Likely is having high readings at the dinner meal especially when not taking the NovoLog consistently Also discussed that he will be at risk for low sugars if he is taking his shot in the morning and not eating as well as waiting more than 15 minutes to eat after the shot  LIPIDS: LDL is below 100 and can be observed without treatment given his diagnosis of pancreatic cancer  Plan: He will start testing blood sugar at least once a day about 2 hours after one of his meals  Again discussed the need for continuous glucose monitoring as a way of completely evaluating his blood sugar patterns, comedian's and help with adjusting his insulin doses Discussed how the freestyle libre sensor works, use of the phone app for this and availability of high and low sugar alerts We will send a prescription to [facility] for him to start getting freestyle libre sensor from a DME supplier He will let us know if he needs  any help  Reminded him not to take NovoLog if he is not eating especially in the morning He will adjust the dose based on his results after meals for his blood sugars If blood sugar about 2 hours after  the meal is still over 200 go up 2-4 units for that meal the next day To take his insulin with him when he is eating out  He will likely need ADDITIONAL 6 to 8 units at mealtimes on the day of your dexamethasone infusion and for at least 1 or 2 days based on his blood sugars  TRESIBA insulin:  We will go up to 32 units for now He will follow the flowsheet with the name instructions  given in the office for monitoring his dosage and adjusting it based on fasting readings  Follow-up with diabetes educator if needed especially to start the sensor    There are no Patient Instructions on file for this visit.   Elayne Snare 07/18/2022, 1:56 PM

## 2022-07-18 NOTE — Patient Instructions (Signed)
Zachary Reid 32

## 2022-07-19 ENCOUNTER — Other Ambulatory Visit: Payer: Self-pay

## 2022-07-22 ENCOUNTER — Other Ambulatory Visit: Payer: Self-pay | Admitting: Hematology

## 2022-07-22 DIAGNOSIS — C259 Malignant neoplasm of pancreas, unspecified: Secondary | ICD-10-CM

## 2022-07-24 MED FILL — Fosaprepitant Dimeglumine For IV Infusion 150 MG (Base Eq): INTRAVENOUS | Qty: 5 | Status: AC

## 2022-07-24 MED FILL — Dexamethasone Sodium Phosphate Inj 100 MG/10ML: INTRAMUSCULAR | Qty: 1 | Status: AC

## 2022-07-25 ENCOUNTER — Inpatient Hospital Stay: Payer: Medicare Other

## 2022-07-25 ENCOUNTER — Inpatient Hospital Stay (HOSPITAL_BASED_OUTPATIENT_CLINIC_OR_DEPARTMENT_OTHER): Payer: Medicare Other | Admitting: Hematology

## 2022-07-25 ENCOUNTER — Other Ambulatory Visit: Payer: Self-pay

## 2022-07-25 ENCOUNTER — Encounter: Payer: Self-pay | Admitting: Hematology

## 2022-07-25 ENCOUNTER — Inpatient Hospital Stay: Payer: Medicare Other | Attending: Hematology

## 2022-07-25 VITALS — BP 126/87 | HR 102 | Temp 97.8°F | Resp 18 | Ht 71.0 in | Wt 181.8 lb

## 2022-07-25 DIAGNOSIS — E119 Type 2 diabetes mellitus without complications: Secondary | ICD-10-CM | POA: Diagnosis not present

## 2022-07-25 DIAGNOSIS — C259 Malignant neoplasm of pancreas, unspecified: Secondary | ICD-10-CM

## 2022-07-25 DIAGNOSIS — C7800 Secondary malignant neoplasm of unspecified lung: Secondary | ICD-10-CM | POA: Insufficient documentation

## 2022-07-25 DIAGNOSIS — E785 Hyperlipidemia, unspecified: Secondary | ICD-10-CM | POA: Diagnosis not present

## 2022-07-25 DIAGNOSIS — Z79899 Other long term (current) drug therapy: Secondary | ICD-10-CM | POA: Insufficient documentation

## 2022-07-25 DIAGNOSIS — C250 Malignant neoplasm of head of pancreas: Secondary | ICD-10-CM | POA: Insufficient documentation

## 2022-07-25 DIAGNOSIS — F419 Anxiety disorder, unspecified: Secondary | ICD-10-CM | POA: Diagnosis not present

## 2022-07-25 DIAGNOSIS — Z87442 Personal history of urinary calculi: Secondary | ICD-10-CM | POA: Insufficient documentation

## 2022-07-25 DIAGNOSIS — E860 Dehydration: Secondary | ICD-10-CM

## 2022-07-25 DIAGNOSIS — K7689 Other specified diseases of liver: Secondary | ICD-10-CM | POA: Insufficient documentation

## 2022-07-25 DIAGNOSIS — I700 Atherosclerosis of aorta: Secondary | ICD-10-CM | POA: Diagnosis not present

## 2022-07-25 DIAGNOSIS — G4700 Insomnia, unspecified: Secondary | ICD-10-CM | POA: Insufficient documentation

## 2022-07-25 DIAGNOSIS — Z5111 Encounter for antineoplastic chemotherapy: Secondary | ICD-10-CM | POA: Diagnosis present

## 2022-07-25 DIAGNOSIS — Z95828 Presence of other vascular implants and grafts: Secondary | ICD-10-CM

## 2022-07-25 DIAGNOSIS — Z794 Long term (current) use of insulin: Secondary | ICD-10-CM | POA: Diagnosis not present

## 2022-07-25 LAB — CMP (CANCER CENTER ONLY)
ALT: 23 U/L (ref 0–44)
AST: 21 U/L (ref 15–41)
Albumin: 4 g/dL (ref 3.5–5.0)
Alkaline Phosphatase: 81 U/L (ref 38–126)
Anion gap: 4 — ABNORMAL LOW (ref 5–15)
BUN: 18 mg/dL (ref 8–23)
CO2: 27 mmol/L (ref 22–32)
Calcium: 9.4 mg/dL (ref 8.9–10.3)
Chloride: 109 mmol/L (ref 98–111)
Creatinine: 0.91 mg/dL (ref 0.61–1.24)
GFR, Estimated: >60 mL/min (ref >60)
Glucose, Bld: 79 mg/dL (ref 70–99)
Potassium: 3.6 mmol/L (ref 3.5–5.1)
Sodium: 140 mmol/L (ref 135–145)
Total Bilirubin: 0.5 mg/dL (ref 0.3–1.2)
Total Protein: 6.8 g/dL (ref 6.5–8.1)

## 2022-07-25 LAB — CBC WITH DIFFERENTIAL (CANCER CENTER ONLY)
Abs Immature Granulocytes: 0.01 10*3/uL (ref 0.00–0.07)
Basophils Absolute: 0 10*3/uL (ref 0.0–0.1)
Basophils Relative: 0 %
Eosinophils Absolute: 0.1 10*3/uL (ref 0.0–0.5)
Eosinophils Relative: 3 %
HCT: 39.4 % (ref 39.0–52.0)
Hemoglobin: 13.9 g/dL (ref 13.0–17.0)
Immature Granulocytes: 0 %
Lymphocytes Relative: 28 %
Lymphs Abs: 1.4 10*3/uL (ref 0.7–4.0)
MCH: 33.9 pg (ref 26.0–34.0)
MCHC: 35.3 g/dL (ref 30.0–36.0)
MCV: 96.1 fL (ref 80.0–100.0)
Monocytes Absolute: 0.6 10*3/uL (ref 0.1–1.0)
Monocytes Relative: 12 %
Neutro Abs: 2.9 10*3/uL (ref 1.7–7.7)
Neutrophils Relative %: 57 %
Platelet Count: 108 10*3/uL — ABNORMAL LOW (ref 150–400)
RBC: 4.1 MIL/uL — ABNORMAL LOW (ref 4.22–5.81)
RDW: 13.1 % (ref 11.5–15.5)
WBC Count: 5 10*3/uL (ref 4.0–10.5)
nRBC: 0 % (ref 0.0–0.2)

## 2022-07-25 MED ORDER — SODIUM CHLORIDE 0.9 % IV SOLN
150.0000 mg/m2 | Freq: Once | INTRAVENOUS | Status: AC
  Administered 2022-07-25: 300 mg via INTRAVENOUS
  Filled 2022-07-25: qty 15

## 2022-07-25 MED ORDER — SODIUM CHLORIDE 0.9 % IV SOLN
150.0000 mg | Freq: Once | INTRAVENOUS | Status: AC
  Administered 2022-07-25: 150 mg via INTRAVENOUS
  Filled 2022-07-25: qty 150

## 2022-07-25 MED ORDER — TRAMADOL HCL 50 MG PO TABS: 50.0000 mg | ORAL_TABLET | Freq: Four times a day (QID) | ORAL | 0 refills | Status: DC | PRN

## 2022-07-25 MED ORDER — HEPARIN SOD (PORK) LOCK FLUSH 100 UNIT/ML IV SOLN: 500.0000 [IU] | Freq: Once | INTRAVENOUS | Status: DC | PRN

## 2022-07-25 MED ORDER — PROCHLORPERAZINE EDISYLATE 10 MG/2ML IJ SOLN
10.0000 mg | Freq: Once | INTRAMUSCULAR | Status: AC
  Administered 2022-07-25: 10 mg via INTRAVENOUS
  Filled 2022-07-25: qty 2

## 2022-07-25 MED ORDER — SODIUM CHLORIDE 0.9% FLUSH
10.0000 mL | INTRAVENOUS | Status: DC | PRN
  Administered 2022-07-25: 10 mL

## 2022-07-25 MED ORDER — SODIUM CHLORIDE 0.9 % IV SOLN
1800.0000 mg/m2 | INTRAVENOUS | Status: DC
  Administered 2022-07-25: 3650 mg via INTRAVENOUS
  Filled 2022-07-25: qty 73

## 2022-07-25 MED ORDER — SODIUM CHLORIDE 0.9 % IV SOLN
10.0000 mg | Freq: Once | INTRAVENOUS | Status: AC
  Administered 2022-07-25: 10 mg via INTRAVENOUS
  Filled 2022-07-25: qty 10

## 2022-07-25 MED ORDER — ATROPINE SULFATE 1 MG/ML IV SOLN
0.5000 mg | Freq: Once | INTRAVENOUS | Status: AC | PRN
  Administered 2022-07-25: 0.5 mg via INTRAVENOUS
  Filled 2022-07-25: qty 1

## 2022-07-25 MED ORDER — SODIUM CHLORIDE 0.9 % IV SOLN: Freq: Once | INTRAVENOUS | Status: AC

## 2022-07-25 MED ORDER — SODIUM CHLORIDE 0.9 % IV SOLN
400.0000 mg/m2 | Freq: Once | INTRAVENOUS | Status: AC
  Administered 2022-07-25: 812 mg via INTRAVENOUS
  Filled 2022-07-25: qty 40.6

## 2022-07-25 MED ORDER — PALONOSETRON HCL INJECTION 0.25 MG/5ML
0.2500 mg | Freq: Once | INTRAVENOUS | Status: AC
  Administered 2022-07-25: 0.25 mg via INTRAVENOUS
  Filled 2022-07-25: qty 5

## 2022-07-25 MED ORDER — SODIUM CHLORIDE 0.9% FLUSH
10.0000 mL | Freq: Once | INTRAVENOUS | Status: AC
  Administered 2022-07-25: 10 mL

## 2022-07-25 MED ORDER — DEXTROSE 5 % IV SOLN: Freq: Once | INTRAVENOUS | Status: AC

## 2022-07-25 MED ORDER — OXALIPLATIN CHEMO INJECTION 100 MG/20ML
30.0000 mg/m2 | Freq: Once | INTRAVENOUS | Status: AC
  Administered 2022-07-25: 60 mg via INTRAVENOUS
  Filled 2022-07-25: qty 12

## 2022-07-25 NOTE — Progress Notes (Signed)
[facility]   [phone] Fax:([phone]   Clinic Follow up Note   Patient Care Team: [name], MD as PCP - General (Internal Medicine) [name], NP as PCP - Hematology/Oncology (Nurse Practitioner) [name], MD as Consulting Physician (Orthopedic Surgery) [name], MD as Consulting Physician (Cardiology) [name], MD as Consulting Physician (Oncology) Royston Bake, RN as Oncology Nurse Navigator (Oncology)  Date of Service:  07/25/2022  CHIEF COMPLAINT: f/u of metastatic pancreatic cancer  CURRENT THERAPY:  First line FOLFIRINOX, starting 08/13/21, now q3weeks  ASSESSMENT & PLAN:  Zachary Reid is a 66 y.o. male with   1. Metastatic pancreatic adenocarcinoma, [id number] with numerous small cavitary lesions in bilateral lungs, MMR normal  -found incidentally on recent CT scan for left kidney stone. He also had nonspecific symptoms, including epigastric pain, chronic right flank pain, dry cough, and significant weight loss in past several years. -colonoscopy and EUS on 07/26/21 demonstrated a solid-cystic mass in the genu-body of the pancreas, with endosonographic evidence of SMA abutment, T4N0. Cytology confirmed adenocarcinoma.  -Baseline CA 19-9 elevated to 273 on 07/03/21 -He began first-line mFOLFIRINOX on 08/13/21, tolerated moderately well with severe fatigue for 3-7 days, constipation, and mild pancytopenia. He is able to recover well. GCSF added with cycle 2 but discontinued due to tolerance. -restaging CT CAP on 05/14/22 showed overall stable disease, unchanged pancreatic head mass or pulmonary nodules. No new disease.  -he is now receiving treatment every 3 weeks, but he still required further dose reduction due to cytopenias.  -his CA 19-9 trended up but is now stable, last 147 on 07/04/22. -he reports he tolerated last treatment very well. Labs reviewed, improved, WBC and hgb WNL today, plt improved to 108k. Adequate to  proceed with treatment. -plan for restaging scan in 2-3 weeks   2. 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 He is undergoing a form of light therapy, which he feels is helpful, and is on low-dose gabapentin. -we reviewed pain management again today. He is taking 1.5 tablets (75 mg) tramadol twice a day. I encouraged him to take more often.   3. Anxiety, Insomnia -anxiety began 2022. He did not tolerate lexapro and wellbutrin due to constipation. -currently on Xanax and Ambien (uses brand name) -he previously saw Dr. Michail Sermon, did not find this helpful and does not wish to f/u.   4. Genetic Testing -he reports pancreatic cancer in his mother, prostate cancer in his father, ovarian in PGM, and lung in PGF (smoker). -he proceeded with testing on 07/30/21. 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 DM -he is now followed by Dr. Loanne Drilling, now on metformin and glucotrol. -BG improved but remains elevated     PLAN: -proceed with FOLFIRINOX with same dose reduction -I refilled tramadol, he will slightly increase the dose  -CT to be done in 2-3 weeks, he will call radiology to schedule  -lab, flush, f/u, and FOLFIRINOX every 3 weeks              -he prefers Thursday morning appointments   No problem-specific Assessment & Plan notes found for this encounter.   SUMMARY OF ONCOLOGIC HISTORY: Oncology History Overview Note   Cancer Staging  Pancreatic adenocarcinoma [facility]) Staging form: Exocrine Pancreas, AJCC 8th Edition - Clinical stage from 07/26/2021: Stage IV (cT4, cN0, cM1) - Signed by [name], MD on 07/28/2021    Pancreatic adenocarcinoma ([place])  07/02/2021 Initial  Diagnosis   Pancreatic adenocarcinoma ([place])   07/26/2021 Cancer Staging   Staging form: Exocrine Pancreas, AJCC 8th Edition - Clinical stage from 07/26/2021: Stage IV (cT4, cN0, cM1) - Signed by [name], MD on 07/28/2021 Stage prefix: Initial diagnosis Total positive  nodes: 0   08/13/2021 - 07/06/2022 Chemotherapy   Patient is on Treatment Plan : PANCREAS Modified FOLFIRINOX q14d x 4 cycles      Genetic Testing   [facility] CancerNext-Expanded results (77 genes) were negative. No pathogenic variants were identified. 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 The report date is 08/15/2021.    The CancerNext-Expanded gene panel offered by [facility] and includes sequencing, rearrangement, and RNA analysis for the following 77 genes: AIP, ALK, APC, ATM, AXIN2, BAP1, BARD1, BLM, BMPR1A, BRCA1, BRCA2, BRIP1, CDC73, CDH1, CDK4, CDKN1B, CDKN2A, CHEK2, CTNNA1, DICER1, FANCC, FH, FLCN, GALNT12, KIF1B, LZTR1, MAX, MEN1, MET, MLH1, MSH2, MSH3, MSH6, MUTYH, NBN, NF1, NF2, NTHL1, PALB2, PHOX2B, PMS2, POT1, PRKAR1A, PTCH1, PTEN, RAD51C, RAD51D, RB1, RECQL, RET, SDHA, SDHAF2, SDHB, SDHC, SDHD, SMAD4, SMARCA4, SMARCB1, SMARCE1, STK11, SUFU, TMEM127, TP53, TSC1, TSC2, VHL and XRCC2 (sequencing and deletion/duplication); EGFR, EGLN1, HOXB13, KIT, MITF, PDGFRA, POLD1, and POLE (sequencing only); EPCAM and GREM1 (deletion/duplication only).     08/13/2021 -  Chemotherapy   Patient is on Treatment Plan : PANCREAS Modified FOLFIRINOX q14d x 4 cycles     10/25/2021 Imaging   EXAM: CT CHEST, ABDOMEN, AND PELVIS WITH CONTRAST  IMPRESSION: 1. Innumerable bilateral pulmonary nodules, many of which are cavitary, consistent with metastatic disease. These nodules show no substantial change in are minimally progressed in the interval. 2. Mix cystic and solid lesion in the head and body of the pancreas is similar to prior and also comparing back to MRI 07/02/2021. 3. Hepatic cysts. 4. 8 mm nonobstructing left renal stone. 5. Aortic Atherosclerosis (ICD10-I70.0).   01/28/2022 Imaging   EXAM: CT CHEST, ABDOMEN, AND PELVIS WITH CONTRAST  IMPRESSION: 1. Innumerable bilateral small solid and cavitary pulmonary nodules, some of the cavitary nodules are  slightly less thick-walled when compared with prior exam. 2. Mixed cystic and solid lesion in the head and body of the pancreas is similar to prior exam. 3. Nonobstructing left renal stone. 4.  Aortic Atherosclerosis (ICD10-I70.0).      INTERVAL HISTORY:  Zachary Reid is here for a follow up of metastatic pancreatic cancer. He was last seen by me on 07/04/22. He presents to the clinic accompanied by his wife. He reports he is doing well overall except for concerns with pain management.   All other systems were reviewed with the patient and are negative.  MEDICAL HISTORY:  Past Medical History:  Diagnosis Date   Abnormal EKG    DM (diabetes mellitus) ([place])    Hyperlipidemia    Hypogonadism male    IBS (irritable bowel syndrome)    Obesity    BMI 31   pancreatic ca 06/28/2021   Vitamin D deficiency     SURGICAL HISTORY: Past Surgical History:  Procedure Laterality Date   APPENDECTOMY     BIOPSY  07/26/2021   Procedure: BIOPSY;  Surgeon: Irving Copas., MD;  Location: [facility];  Service: Gastroenterology;;   COLONOSCOPY WITH PROPOFOL N/A 07/26/2021   Procedure: COLONOSCOPY WITH PROPOFOL;  Surgeon: Irving Copas., MD;  Location: [facility];  Service: Gastroenterology;  Laterality: N/A;   ESOPHAGOGASTRODUODENOSCOPY (EGD) WITH PROPOFOL N/A 07/26/2021   Procedure: ESOPHAGOGASTRODUODENOSCOPY (EGD) WITH PROPOFOL;  Surgeon: Irving Copas.,  MD;  Location: [facility];  Service: Gastroenterology;  Laterality: N/A;   EUS N/A 07/26/2021   Procedure: UPPER ENDOSCOPIC ULTRASOUND (EUS) RADIAL;  Surgeon: Irving Copas., MD;  Location: [facility];  Service: Gastroenterology;  Laterality: N/A;   FINE NEEDLE ASPIRATION  07/26/2021   Procedure: FINE NEEDLE ASPIRATION (FNA) LINEAR;  Surgeon: Rush Landmark Telford Nab., MD;  Location: [place] [facility];  Service: Gastroenterology;;   IR IMAGING GUIDED PORT INSERTION  08/10/2021   POLYPECTOMY  07/26/2021   Procedure:  POLYPECTOMY;  Surgeon: Rush Landmark Telford Nab., MD;  Location: [place] [facility];  Service: Gastroenterology;;   SHOULDER SURGERY Right     I have reviewed the social history and family history with the patient and they are unchanged from previous note.  ALLERGIES:  is allergic to lipitor [atorvastatin].  MEDICATIONS:  Current Outpatient Medications  Medication Sig Dispense Refill   ALPRAZolam (XANAX) 0.25 MG tablet Take 1-2 tablets (0.25-0.5 mg total) by mouth 2 (two) times daily as needed for anxiety. 60 tablet 0   Cholecalciferol (VITAMIN D3) 125 MCG (5000 UT) CAPS Take 5,000 Units by mouth in the morning and at bedtime.     gabapentin (NEURONTIN) 600 MG tablet Take 1 tablet (600 mg total) by mouth 3 (three) times daily. 90 tablet 1   glucose blood test strip Use as instructed 100 each 12   hydrocortisone (ANUSOL-HC) 25 MG suppository Place 1 suppository (25 mg total) rectally at bedtime. QHS x 1-week and then Every Other night until prescription complete (Patient not taking: Reported on 05/22/2022) 12 suppository 0   hyoscyamine (LEVSIN SL) 0.125 MG SL tablet DISSOLVE ONE TABLET UNDER THE TONGUE 4 TIMES DAILY UP TO EVERY 4 HOURS AS NEEDED FOR NAUSEA, BLOATING, CRAMPING, OR DIARRHEA 100 tablet 0   insulin degludec (TRESIBA FLEXTOUCH) 100 UNIT/ML FlexTouch Pen Inject 30 units every morning and adjust as directed, maximum daily dose 50 units 15 mL 1   insulin lispro (HUMALOG KWIKPEN) 100 UNIT/ML KwikPen 10 to 15 units before meals as directed 15 mL 1   ondansetron (ZOFRAN ODT) 4 MG disintegrating tablet Take 1 tablet (4 mg total) by mouth every 8 (eight) hours as needed for nausea or vomiting. (Patient not taking: Reported on 05/22/2022) 20 tablet 0   oxyCODONE (ROXICODONE) 5 MG immediate release tablet Take 1 tablet (5 mg total) by mouth every 6 (six) hours as needed for up to 6 doses for severe pain. 30 tablet 0   traMADol (ULTRAM) 50 MG tablet Take 1-2 tablets (50-100 mg total) by mouth every 6 (six)  hours as needed. 120 tablet 0   zolpidem (AMBIEN) 10 MG tablet Take 1 tablet (10 mg total) by mouth at bedtime as needed for sleep. 30 tablet 0   No current facility-administered medications for this visit.   Facility-Administered Medications Ordered in Other Visits  Medication Dose Route Frequency Provider Last Rate Last Admin   atropine injection 0.5 mg  0.5 mg Intravenous Once PRN [name], MD       dexamethasone (DECADRON) 10 mg in sodium chloride 0.9 % 50 mL IVPB  10 mg Intravenous Once [name], MD       dextrose 5 % solution   Intravenous Once [name], MD       fluorouracil (ADRUCIL) 3,650 mg in sodium chloride 0.9 % 77 mL chemo infusion  1,800 mg/m2 (Treatment Plan Recorded) Intravenous 1 day or 1 dose [name], MD       fosaprepitant (EMEND) 150 mg in sodium chloride 0.9 %  145 mL IVPB  150 mg Intravenous Once [name], MD       heparin lock flush 100 unit/mL  500 Units Intracatheter Once PRN [name], MD       irinotecan (CAMPTOSAR) 300 mg in sodium chloride 0.9 % 500 mL chemo infusion  150 mg/m2 (Treatment Plan Recorded) Intravenous Once [name], MD       leucovorin 812 mg in sodium chloride 0.9 % 250 mL infusion  400 mg/m2 (Treatment Plan Recorded) Intravenous Once [name], MD       oxaliplatin (ELOXATIN) 60 mg in dextrose 5 % 500 mL chemo infusion  30 mg/m2 (Treatment Plan Recorded) Intravenous Once [name], MD       palonosetron (ALOXI) injection 0.25 mg  0.25 mg Intravenous Once [name], MD       prochlorperazine (COMPAZINE) injection 10 mg  10 mg Intravenous Once [name], MD       sodium chloride flush (NS) 0.9 % injection 10 mL  10 mL Intracatheter PRN [name], MD        PHYSICAL EXAMINATION: ECOG PERFORMANCE STATUS: 1 - Symptomatic but completely ambulatory  Vitals:   07/25/22 0920  BP: 126/87  Pulse: (!) 102  Resp: 18  Temp: 97.8 F (36.6 C)  SpO2: 99%   Wt Readings from Last 3 Encounters:  07/25/22 181 lb 12.8 oz (82.5 kg)  07/18/22 182 lb 3.2  oz (82.6 kg)  07/04/22 178 lb 12.8 oz (81.1 kg)     GENERAL:alert, no distress and comfortable SKIN: skin color normal, no rashes or significant lesions EYES: normal, Conjunctiva are pink and non-injected, sclera clear  NEURO: alert & oriented x 3 with fluent speech  LABORATORY DATA:  I have reviewed the data as listed    Latest Ref Rng & Units 07/25/2022    9:02 AM 07/04/2022    9:33 AM 06/24/2022    9:01 AM  CBC  WBC 4.0 - 10.5 K/uL 5.0  3.5  5.9   Hemoglobin 13.0 - 17.0 g/dL 13.9  12.1  12.1   Hematocrit 39.0 - 52.0 % 39.4  35.0  34.8   Platelets 150 - 400 K/uL 108  83  93         Latest Ref Rng & Units 07/25/2022    9:02 AM 07/16/2022    9:17 AM 07/04/2022    9:33 AM  CMP  Glucose 70 - 99 mg/dL 79  77  224   BUN 8 - 23 mg/dL 18   18   Creatinine 0.61 - 1.24 mg/dL 0.91   0.88   Sodium 135 - 145 mmol/L 140   139   Potassium 3.5 - 5.1 mmol/L 3.6   4.2   Chloride 98 - 111 mmol/L 109   107   CO2 22 - 32 mmol/L 27   27   Calcium 8.9 - 10.3 mg/dL 9.4   9.4   Total Protein 6.5 - 8.1 g/dL 6.8   6.9   Total Bilirubin 0.3 - 1.2 mg/dL 0.5   0.4   Alkaline Phos 38 - 126 U/L 81   97   AST 15 - 41 U/L 21   22   ALT 0 - 44 U/L 23   22       RADIOGRAPHIC STUDIES: I have personally reviewed the radiological images as listed and agreed with the findings in the report. No results found.    Orders Placed This Encounter  Procedures   CBC with Differential (Cancer  Center Only)    Standing Status:   Future    Standing Expiration Date:   09/27/2023   CMP ([facility] only)    Standing Status:   Future    Standing Expiration Date:   09/27/2023   All questions were answered. The patient knows to call the clinic with any problems, questions or concerns. No barriers to learning was detected. The total time spent in the appointment was 30 minutes.     [name], MD 07/25/2022   I, Wilburn Mylar, am acting as scribe for [name], MD.   I have reviewed the above documentation for  accuracy and completeness, and I agree with the above.

## 2022-07-25 NOTE — Patient Instructions (Addendum)
[facility] ONCOLOGY  Discharge Instructions: Thank you for choosing [facility] to provide your oncology and hematology care.   If you have a lab appointment with the [facility], please go directly to the [facility] and check in at the registration area.   Wear comfortable clothing and clothing appropriate for easy access to any Portacath or PICC line.   We strive to give you quality time with your provider. You may need to reschedule your appointment if you arrive late (15 or more minutes).  Arriving late affects you and other patients whose appointments are after yours.  Also, if you miss three or more appointments without notifying the office, you may be dismissed from the clinic at the provider's discretion.      For prescription refill requests, have your pharmacy contact our office and allow 72 hours for refills to be completed.    Today you received the following chemotherapy and/or immunotherapy agents: oxaliplatin, flourouracil, irinotecan, leucovorin      To help prevent nausea and vomiting after your treatment, we encourage you to take your nausea medication as directed.  BELOW ARE SYMPTOMS THAT SHOULD BE REPORTED IMMEDIATELY: *FEVER GREATER THAN 100.4 F (38 C) OR HIGHER *CHILLS OR SWEATING *NAUSEA AND VOMITING THAT IS NOT CONTROLLED WITH YOUR NAUSEA MEDICATION *UNUSUAL SHORTNESS OF BREATH *UNUSUAL BRUISING OR BLEEDING *URINARY PROBLEMS (pain or burning when urinating, or frequent urination) *BOWEL PROBLEMS (unusual diarrhea, constipation, pain near the anus) TENDERNESS IN MOUTH AND THROAT WITH OR WITHOUT PRESENCE OF ULCERS (sore throat, sores in mouth, or a toothache) UNUSUAL RASH, SWELLING OR PAIN  UNUSUAL VAGINAL DISCHARGE OR ITCHING   Items with * indicate a potential emergency and should be followed up as soon as possible or go to the Emergency Department if any problems should occur.  Please show the CHEMOTHERAPY ALERT CARD or  IMMUNOTHERAPY ALERT CARD at check-in to the Emergency Department and triage nurse.  Should you have questions after your visit or need to cancel or reschedule your appointment, please contact [facility]  Dept: [phone]  and follow the prompts.  Office hours are 8:00 a.m. to 4:30 p.m. Monday - Friday. Please note that voicemails left after 4:00 p.m. may not be returned until the following business day.  We are closed weekends and major holidays. You have access to a nurse at all times for urgent questions. Please call the main number to the clinic Dept: [phone] and follow the prompts.   For any non-urgent questions, you may also contact your provider using MyChart. We now offer e-Visits for anyone 40 and older to request care online for non-urgent symptoms. For details visit [email or web address].   Also download the MyChart app! Go to the app store, search "MyChart", open the app, select [facility], and log in with your MyChart username and password.  Masks are optional in the cancer centers. If you would like for your care team to wear a mask while they are taking care of you, please let them know. You may have one support person who is at least 66 years old accompany you for your appointments.

## 2022-07-27 ENCOUNTER — Inpatient Hospital Stay: Payer: Medicare Other

## 2022-07-27 DIAGNOSIS — Z5111 Encounter for antineoplastic chemotherapy: Secondary | ICD-10-CM | POA: Diagnosis not present

## 2022-07-27 DIAGNOSIS — C259 Malignant neoplasm of pancreas, unspecified: Secondary | ICD-10-CM

## 2022-07-27 MED ORDER — HEPARIN SOD (PORK) LOCK FLUSH 100 UNIT/ML IV SOLN
500.0000 [IU] | Freq: Once | INTRAVENOUS | Status: AC | PRN
  Administered 2022-07-27: 500 [IU]

## 2022-07-27 MED ORDER — SODIUM CHLORIDE 0.9% FLUSH
10.0000 mL | INTRAVENOUS | Status: DC | PRN
  Administered 2022-07-27: 10 mL

## 2022-08-06 ENCOUNTER — Encounter: Payer: Self-pay | Admitting: Hematology

## 2022-08-06 NOTE — Telephone Encounter (Signed)
Open in error

## 2022-08-12 ENCOUNTER — Encounter (HOSPITAL_COMMUNITY): Payer: Self-pay

## 2022-08-12 ENCOUNTER — Ambulatory Visit (HOSPITAL_COMMUNITY)
Admission: RE | Admit: 2022-08-12 | Discharge: 2022-08-12 | Disposition: A | Discharge status: Home / Self Care | Payer: Medicare Other | Source: Ambulatory Visit | Attending: Hematology | Admitting: Hematology

## 2022-08-12 DIAGNOSIS — C259 Malignant neoplasm of pancreas, unspecified: Secondary | ICD-10-CM | POA: Diagnosis not present

## 2022-08-12 MED ORDER — SODIUM CHLORIDE (PF) 0.9 % IJ SOLN
INTRAMUSCULAR | Status: AC
  Filled 2022-08-12: qty 50

## 2022-08-12 MED ORDER — IOHEXOL 300 MG/ML  SOLN
100.0000 mL | Freq: Once | INTRAMUSCULAR | Status: AC | PRN
  Administered 2022-08-12: 100 mL via INTRAVENOUS

## 2022-08-12 MED ORDER — HEPARIN SOD (PORK) LOCK FLUSH 100 UNIT/ML IV SOLN
INTRAVENOUS | Status: AC
  Filled 2022-08-12: qty 5

## 2022-08-12 MED ORDER — HEPARIN SOD (PORK) LOCK FLUSH 100 UNIT/ML IV SOLN
500.0000 [IU] | Freq: Once | INTRAVENOUS | Status: AC
  Administered 2022-08-12: 500 [IU] via INTRAVENOUS

## 2022-08-12 MED ORDER — HEPARIN SOD (PORK) LOCK FLUSH 100 UNIT/ML IV SOLN: 500.0000 [IU] | Freq: Once | INTRAVENOUS | Status: DC

## 2022-08-14 MED FILL — Dexamethasone Sodium Phosphate Inj 100 MG/10ML: INTRAMUSCULAR | Qty: 1 | Status: AC

## 2022-08-14 MED FILL — Fosaprepitant Dimeglumine For IV Infusion 150 MG (Base Eq): INTRAVENOUS | Qty: 5 | Status: AC

## 2022-08-15 ENCOUNTER — Encounter: Payer: Self-pay | Admitting: Hematology

## 2022-08-15 ENCOUNTER — Other Ambulatory Visit: Payer: Self-pay

## 2022-08-15 ENCOUNTER — Inpatient Hospital Stay: Payer: Medicare Other

## 2022-08-15 ENCOUNTER — Inpatient Hospital Stay (HOSPITAL_BASED_OUTPATIENT_CLINIC_OR_DEPARTMENT_OTHER): Payer: Medicare Other | Admitting: Hematology

## 2022-08-15 VITALS — BP 137/76 | HR 75 | Temp 97.8°F | Resp 16 | Ht 71.0 in | Wt 185.1 lb

## 2022-08-15 DIAGNOSIS — C259 Malignant neoplasm of pancreas, unspecified: Secondary | ICD-10-CM

## 2022-08-15 DIAGNOSIS — Z5111 Encounter for antineoplastic chemotherapy: Secondary | ICD-10-CM | POA: Diagnosis not present

## 2022-08-15 DIAGNOSIS — Z95828 Presence of other vascular implants and grafts: Secondary | ICD-10-CM

## 2022-08-15 DIAGNOSIS — E860 Dehydration: Secondary | ICD-10-CM

## 2022-08-15 LAB — CMP (CANCER CENTER ONLY)
ALT: 21 U/L (ref 0–44)
AST: 21 U/L (ref 15–41)
Albumin: 3.9 g/dL (ref 3.5–5.0)
Alkaline Phosphatase: 82 U/L (ref 38–126)
Anion gap: 7 (ref 5–15)
BUN: 14 mg/dL (ref 8–23)
CO2: 27 mmol/L (ref 22–32)
Calcium: 9.1 mg/dL (ref 8.9–10.3)
Chloride: 103 mmol/L (ref 98–111)
Creatinine: 0.87 mg/dL (ref 0.61–1.24)
GFR, Estimated: >60 mL/min (ref >60)
Glucose, Bld: 290 mg/dL — ABNORMAL HIGH (ref 70–99)
Potassium: 4.2 mmol/L (ref 3.5–5.1)
Sodium: 137 mmol/L (ref 135–145)
Total Bilirubin: 0.5 mg/dL (ref 0.3–1.2)
Total Protein: 6.8 g/dL (ref 6.5–8.1)

## 2022-08-15 LAB — CBC WITH DIFFERENTIAL (CANCER CENTER ONLY)
Abs Immature Granulocytes: 0 10*3/uL (ref 0.00–0.07)
Basophils Absolute: 0 10*3/uL (ref 0.0–0.1)
Basophils Relative: 1 %
Eosinophils Absolute: 0.1 10*3/uL (ref 0.0–0.5)
Eosinophils Relative: 2 %
HCT: 34.8 % — ABNORMAL LOW (ref 39.0–52.0)
Hemoglobin: 12.3 g/dL — ABNORMAL LOW (ref 13.0–17.0)
Immature Granulocytes: 0 %
Lymphocytes Relative: 26 %
Lymphs Abs: 0.9 10*3/uL (ref 0.7–4.0)
MCH: 34.1 pg — ABNORMAL HIGH (ref 26.0–34.0)
MCHC: 35.3 g/dL (ref 30.0–36.0)
MCV: 96.4 fL (ref 80.0–100.0)
Monocytes Absolute: 0.4 10*3/uL (ref 0.1–1.0)
Monocytes Relative: 12 %
Neutro Abs: 2 10*3/uL (ref 1.7–7.7)
Neutrophils Relative %: 59 %
Platelet Count: 85 10*3/uL — ABNORMAL LOW (ref 150–400)
RBC: 3.61 MIL/uL — ABNORMAL LOW (ref 4.22–5.81)
RDW: 12.5 % (ref 11.5–15.5)
WBC Count: 3.4 10*3/uL — ABNORMAL LOW (ref 4.0–10.5)
nRBC: 0 % (ref 0.0–0.2)

## 2022-08-15 MED ORDER — SODIUM CHLORIDE 0.9% FLUSH: 10.0000 mL | INTRAVENOUS | Status: DC | PRN

## 2022-08-15 MED ORDER — SODIUM CHLORIDE 0.9 % IV SOLN
400.0000 mg/m2 | Freq: Once | INTRAVENOUS | Status: AC
  Administered 2022-08-15: 812 mg via INTRAVENOUS
  Filled 2022-08-15: qty 40.6

## 2022-08-15 MED ORDER — SODIUM CHLORIDE 0.9% FLUSH
10.0000 mL | Freq: Once | INTRAVENOUS | Status: AC
  Administered 2022-08-15: 10 mL

## 2022-08-15 MED ORDER — DEXTROSE 5 % IV SOLN: Freq: Once | INTRAVENOUS | Status: AC

## 2022-08-15 MED ORDER — ATROPINE SULFATE 1 MG/ML IV SOLN
0.5000 mg | Freq: Once | INTRAVENOUS | Status: AC | PRN
  Administered 2022-08-15: 0.5 mg via INTRAVENOUS
  Filled 2022-08-15: qty 1

## 2022-08-15 MED ORDER — PALONOSETRON HCL INJECTION 0.25 MG/5ML
0.2500 mg | Freq: Once | INTRAVENOUS | Status: AC
  Administered 2022-08-15: 0.25 mg via INTRAVENOUS
  Filled 2022-08-15: qty 5

## 2022-08-15 MED ORDER — ZOLPIDEM TARTRATE 10 MG PO TABS: 10.0000 mg | ORAL_TABLET | Freq: Every evening | ORAL | 0 refills | Status: DC | PRN

## 2022-08-15 MED ORDER — PROCHLORPERAZINE EDISYLATE 10 MG/2ML IJ SOLN
10.0000 mg | Freq: Once | INTRAMUSCULAR | Status: AC
  Administered 2022-08-15: 10 mg via INTRAVENOUS
  Filled 2022-08-15: qty 2

## 2022-08-15 MED ORDER — ALPRAZOLAM 0.25 MG PO TABS: 0.2500 mg | ORAL_TABLET | Freq: Two times a day (BID) | ORAL | 0 refills | Status: DC | PRN

## 2022-08-15 MED ORDER — SODIUM CHLORIDE 0.9 % IV SOLN
1800.0000 mg/m2 | INTRAVENOUS | Status: DC
  Administered 2022-08-15: 3650 mg via INTRAVENOUS
  Filled 2022-08-15: qty 73

## 2022-08-15 MED ORDER — SODIUM CHLORIDE 0.9 % IV SOLN
150.0000 mg | Freq: Once | INTRAVENOUS | Status: AC
  Administered 2022-08-15: 150 mg via INTRAVENOUS
  Filled 2022-08-15: qty 150

## 2022-08-15 MED ORDER — HEPARIN SOD (PORK) LOCK FLUSH 100 UNIT/ML IV SOLN: 500.0000 [IU] | Freq: Once | INTRAVENOUS | Status: DC | PRN

## 2022-08-15 MED ORDER — SODIUM CHLORIDE 0.9 % IV SOLN
10.0000 mg | Freq: Once | INTRAVENOUS | Status: AC
  Administered 2022-08-15: 10 mg via INTRAVENOUS
  Filled 2022-08-15: qty 10

## 2022-08-15 MED ORDER — SODIUM CHLORIDE 0.9 % IV SOLN
150.0000 mg/m2 | Freq: Once | INTRAVENOUS | Status: AC
  Administered 2022-08-15: 300 mg via INTRAVENOUS
  Filled 2022-08-15: qty 15

## 2022-08-15 MED ORDER — OXALIPLATIN CHEMO INJECTION 100 MG/20ML
30.0000 mg/m2 | Freq: Once | INTRAVENOUS | Status: AC
  Administered 2022-08-15: 60 mg via INTRAVENOUS
  Filled 2022-08-15: qty 12

## 2022-08-15 NOTE — Patient Instructions (Signed)
[facility]   Discharge Instructions: Thank you for choosing [facility] to provide your oncology and hematology care.   If you have a lab appointment with the [facility], please go directly to the [facility] and check in at the registration area.   Wear comfortable clothing and clothing appropriate for easy access to any Portacath or PICC line.   We strive to give you quality time with your provider. You may need to reschedule your appointment if you arrive late (15 or more minutes).  Arriving late affects you and other patients whose appointments are after yours.  Also, if you miss three or more appointments without notifying the office, you may be dismissed from the clinic at the provider's discretion.      For prescription refill requests, have your pharmacy contact our office and allow 72 hours for refills to be completed.    Today you received the following chemotherapy and/or immunotherapy agents: oxaliplatin, leucovorin, irinotecan, fluorouracil      To help prevent nausea and vomiting after your treatment, we encourage you to take your nausea medication as directed.  BELOW ARE SYMPTOMS THAT SHOULD BE REPORTED IMMEDIATELY: *FEVER GREATER THAN 100.4 F (38 C) OR HIGHER *CHILLS OR SWEATING *NAUSEA AND VOMITING THAT IS NOT CONTROLLED WITH YOUR NAUSEA MEDICATION *UNUSUAL SHORTNESS OF BREATH *UNUSUAL BRUISING OR BLEEDING *URINARY PROBLEMS (pain or burning when urinating, or frequent urination) *BOWEL PROBLEMS (unusual diarrhea, constipation, pain near the anus) TENDERNESS IN MOUTH AND THROAT WITH OR WITHOUT PRESENCE OF ULCERS (sore throat, sores in mouth, or a toothache) UNUSUAL RASH, SWELLING OR PAIN  UNUSUAL VAGINAL DISCHARGE OR ITCHING   Items with * indicate a potential emergency and should be followed up as soon as possible or go to the Emergency Department if any problems should occur.  Please show the CHEMOTHERAPY ALERT CARD  or IMMUNOTHERAPY ALERT CARD at check-in to the Emergency Department and triage nurse.  Should you have questions after your visit or need to cancel or reschedule your appointment, please contact [facility]  Dept: [phone]  and follow the prompts.  Office hours are 8:00 a.m. to 4:30 p.m. Monday - Friday. Please note that voicemails left after 4:00 p.m. may not be returned until the following business day.  We are closed weekends and major holidays. You have access to a nurse at all times for urgent questions. Please call the main number to the clinic Dept: [phone] and follow the prompts.   For any non-urgent questions, you may also contact your provider using MyChart. We now offer e-Visits for anyone 49 and older to request care online for non-urgent symptoms. For details visit [email or web address].   Also download the MyChart app! Go to the app store, search "MyChart", open the app, select [facility], and log in with your MyChart username and password.  Masks are optional in the cancer centers. If you would like for your care team to wear a mask while they are taking care of you, please let them know. You may have one support person who is at least 66 years old accompany you for your appointments.

## 2022-08-15 NOTE — Progress Notes (Signed)
[name]   [phone] [phone]   Clinic Follow up Note   Patient Care Team: [name], MD as PCP - General (Internal Medicine) [name], NP as PCP - Hematology/Oncology (Nurse Practitioner) [name], MD as Consulting Physician (Orthopedic Surgery) [name], MD as Consulting Physician (Cardiology) [name], MD as Consulting Physician (Oncology)  Date of Service:  08/15/2022  CHIEF COMPLAINT: f/u of metastatic pancreatic cancer  CURRENT THERAPY:  First line FOLFIRINOX, starting 08/13/21, now q3weeks  ASSESSMENT & PLAN:  Zachary Reid is a 66 y.o. male with   1. Metastatic pancreatic adenocarcinoma, MO2H4T6 with numerous small cavitary lesions in bilateral lungs, MMR normal  -found incidentally on CT scan for left kidney stone. He also had nonspecific symptoms, including epigastric pain, chronic right flank pain, dry cough, and significant weight loss in past several years. -colonoscopy and EUS on 07/26/21 demonstrated a solid-cystic mass in the genu-body of the pancreas, with endosonographic evidence of SMA abutment, T4N0. Cytology confirmed adenocarcinoma.  -Baseline CA 19-9 elevated to 273 on 07/03/21 -He began first-line mFOLFIRINOX on 08/13/21, tolerated moderately well with severe fatigue for 3-7 days, constipation, and mild pancytopenia. He is able to recover well. GCSF added with cycle 2 but discontinued due to tolerance. -he is now receiving treatment every 3 weeks, but he still required further dose reduction due to cytopenias.  -his CA 19-9 trended up but is now stable, last 147 on 07/04/22. -restaging CT CAP 08/12/22 showed: multiple new and enlarging pulmonary metastases; pancreatic mass grossly stable. -I reviewed the results with them today. I feel the lung nodules are worse than last scan but overall improved from initial scan. We discussed increased the FOLFIRINOX to every 2 weeks, he declined.  I also  discussed changing his irinotecan to liposomal irinotecan. We will plan to change with next cycle.  He agrees.  I also discussed the second line treatment options of gemcitabine and Abraxane, with different schedules, he will likely tolerate every 2-week schedule well.  This will be considered if he progressed further on next scan. -he reports he is doing better with treatment every 3 weeks. Labs reviewed, improved, ANC WNL, hgb 12.3, plt back down to 85k. Adequate to proceed with treatment.   2. 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 He is underwent a form of light therapy, which he feels was helpful, and is on low-dose gabapentin. -he uses ibuprofen for pain, has tramadol to use as needed.   3. DM -he is now followed by Dr. Loanne Drilling, now on metformin and glucotrol. -BG improved but remains elevated     PLAN: -proceed with FOLFIRINOX with same dose reduction as last cycle  -I refilled ambien and xanax  -lab, flush, f/u, and FOLFIRINOX every 3 weeks   -will change iriontecan to liposomal irinotecan             -he prefers Thursday morning appointments   No problem-specific Assessment & Plan notes found for this encounter.   SUMMARY OF ONCOLOGIC HISTORY: Oncology History Overview Note   Cancer Staging  Pancreatic adenocarcinoma [facility]) Staging form: Exocrine Pancreas, AJCC 8th Edition - Clinical stage from 07/26/2021: Stage IV (cT4, cN0, cM1) - Signed by [name], MD on 07/28/2021    Pancreatic adenocarcinoma ([place])  07/02/2021 Initial Diagnosis   Pancreatic adenocarcinoma ([place])   07/26/2021 Cancer Staging   Staging form: Exocrine Pancreas, AJCC 8th Edition - Clinical stage from 07/26/2021: Stage IV (cT4, cN0,  cM1) - Signed by [name], MD on 07/28/2021 Stage prefix: Initial diagnosis Total positive nodes: 0   08/13/2021 - 07/06/2022 Chemotherapy   Patient is on Treatment Plan : PANCREAS Modified FOLFIRINOX q14d x 4 cycles       Genetic Testing   [facility] CancerNext-Expanded results (77 genes) were negative. No pathogenic variants were identified. 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 The report date is 08/15/2021.    The CancerNext-Expanded gene panel offered by [facility] and includes sequencing, rearrangement, and RNA analysis for the following 77 genes: AIP, ALK, APC, ATM, AXIN2, BAP1, BARD1, BLM, BMPR1A, BRCA1, BRCA2, BRIP1, CDC73, CDH1, CDK4, CDKN1B, CDKN2A, CHEK2, CTNNA1, DICER1, FANCC, FH, FLCN, GALNT12, KIF1B, LZTR1, MAX, MEN1, MET, MLH1, MSH2, MSH3, MSH6, MUTYH, NBN, NF1, NF2, NTHL1, PALB2, PHOX2B, PMS2, POT1, PRKAR1A, PTCH1, PTEN, RAD51C, RAD51D, RB1, RECQL, RET, SDHA, SDHAF2, SDHB, SDHC, SDHD, SMAD4, SMARCA4, SMARCB1, SMARCE1, STK11, SUFU, TMEM127, TP53, TSC1, TSC2, VHL and XRCC2 (sequencing and deletion/duplication); EGFR, EGLN1, HOXB13, KIT, MITF, PDGFRA, POLD1, and POLE (sequencing only); EPCAM and GREM1 (deletion/duplication only).     08/13/2021 -  Chemotherapy   Patient is on Treatment Plan : PANCREAS Modified FOLFIRINOX q14d x 4 cycles     10/25/2021 Imaging   EXAM: CT CHEST, ABDOMEN, AND PELVIS WITH CONTRAST  IMPRESSION: 1. Innumerable bilateral pulmonary nodules, many of which are cavitary, consistent with metastatic disease. These nodules show no substantial change in are minimally progressed in the interval. 2. Mix cystic and solid lesion in the head and body of the pancreas is similar to prior and also comparing back to MRI 07/02/2021. 3. Hepatic cysts. 4. 8 mm nonobstructing left renal stone. 5. Aortic Atherosclerosis (ICD10-I70.0).   01/28/2022 Imaging   EXAM: CT CHEST, ABDOMEN, AND PELVIS WITH CONTRAST  IMPRESSION: 1. Innumerable bilateral small solid and cavitary pulmonary nodules, some of the cavitary nodules are slightly less thick-walled when compared with prior exam. 2. Mixed cystic and solid lesion in the head and body of the pancreas is similar  to prior exam. 3. Nonobstructing left renal stone. 4.  Aortic Atherosclerosis (ICD10-I70.0).      INTERVAL HISTORY:  Zachary Reid is here for a follow up of metastatic pancreatic cancer. He was last seen by me on 07/25/22. He presents to the clinic accompanied by his wife. He reports he is tolerating better with every 3 week treatment.   All other systems were reviewed with the patient and are negative.  MEDICAL HISTORY:  Past Medical History:  Diagnosis Date   Abnormal EKG    DM (diabetes mellitus) ([place])    Hyperlipidemia    Hypogonadism male    IBS (irritable bowel syndrome)    Obesity    BMI 31   pancreatic ca 06/28/2021   Vitamin D deficiency     SURGICAL HISTORY: Past Surgical History:  Procedure Laterality Date   APPENDECTOMY     BIOPSY  07/26/2021   Procedure: BIOPSY;  Surgeon: Irving Copas., MD;  Location: [facility];  Service: Gastroenterology;;   COLONOSCOPY WITH PROPOFOL N/A 07/26/2021   Procedure: COLONOSCOPY WITH PROPOFOL;  Surgeon: Irving Copas., MD;  Location: [facility];  Service: Gastroenterology;  Laterality: N/A;   ESOPHAGOGASTRODUODENOSCOPY (EGD) WITH PROPOFOL N/A 07/26/2021   Procedure: ESOPHAGOGASTRODUODENOSCOPY (EGD) WITH PROPOFOL;  Surgeon: Rush Landmark Telford Nab., MD;  Location: [facility];  Service: Gastroenterology;  Laterality: N/A;   EUS N/A 07/26/2021   Procedure: UPPER ENDOSCOPIC ULTRASOUND (EUS) RADIAL;  Surgeon: Irving Copas., MD;  [facility];  Service: Gastroenterology;  Laterality: N/A;   FINE NEEDLE ASPIRATION  07/26/2021   Procedure: FINE NEEDLE ASPIRATION (FNA) LINEAR;  Surgeon: Rush Landmark Telford Nab., MD;  Location: [facility];  Service: Gastroenterology;;   IR IMAGING GUIDED PORT INSERTION  08/10/2021   POLYPECTOMY  07/26/2021   Procedure: POLYPECTOMY;  Surgeon: Rush Landmark Telford Nab., MD;  Location: [facility];  Service: Gastroenterology;;   SHOULDER SURGERY Right     I have reviewed the  social history and family history with the patient and they are unchanged from previous note.  ALLERGIES:  is allergic to lipitor [atorvastatin].  MEDICATIONS:  Current Outpatient Medications  Medication Sig Dispense Refill   ALPRAZolam (XANAX) 0.25 MG tablet Take 1-2 tablets (0.25-0.5 mg total) by mouth 2 (two) times daily as needed for anxiety. 60 tablet 0   Cholecalciferol (VITAMIN D3) 125 MCG (5000 UT) CAPS Take 5,000 Units by mouth in the morning and at bedtime.     gabapentin (NEURONTIN) 600 MG tablet Take 1 tablet (600 mg total) by mouth 3 (three) times daily. 90 tablet 1   glucose blood test strip Use as instructed 100 each 12   hyoscyamine (LEVSIN SL) 0.125 MG SL tablet DISSOLVE ONE TABLET UNDER THE TONGUE 4 TIMES DAILY UP TO EVERY 4 HOURS AS NEEDED FOR NAUSEA, BLOATING, CRAMPING, OR DIARRHEA 100 tablet 0   insulin degludec (TRESIBA FLEXTOUCH) 100 UNIT/ML FlexTouch Pen Inject 30 units every morning and adjust as directed, maximum daily dose 50 units 15 mL 1   insulin lispro (HUMALOG KWIKPEN) 100 UNIT/ML KwikPen 10 to 15 units before meals as directed 15 mL 1   ondansetron (ZOFRAN ODT) 4 MG disintegrating tablet Take 1 tablet (4 mg total) by mouth every 8 (eight) hours as needed for nausea or vomiting. (Patient not taking: Reported on 05/22/2022) 20 tablet 0   oxyCODONE (ROXICODONE) 5 MG immediate release tablet Take 1 tablet (5 mg total) by mouth every 6 (six) hours as needed for up to 6 doses for severe pain. 30 tablet 0   traMADol (ULTRAM) 50 MG tablet Take 1-2 tablets (50-100 mg total) by mouth every 6 (six) hours as needed. 120 tablet 0   zolpidem (AMBIEN) 10 MG tablet Take 1 tablet (10 mg total) by mouth at bedtime as needed for sleep. 30 tablet 0   No current facility-administered medications for this visit.   Facility-Administered Medications Ordered in Other Visits  Medication Dose Route Frequency Provider Last Rate Last Admin   dexamethasone (DECADRON) 10 mg in sodium chloride  0.9 % 50 mL IVPB  10 mg Intravenous Once [name], MD       fluorouracil (ADRUCIL) 3,650 mg in sodium chloride 0.9 % 77 mL chemo infusion  1,800 mg/m2 (Treatment Plan Recorded) Intravenous 1 day or 1 dose [name], MD       fosaprepitant (EMEND) 150 mg in sodium chloride 0.9 % 145 mL IVPB  150 mg Intravenous Once [name], MD       heparin lock flush 100 unit/mL  500 Units Intracatheter Once PRN [name], MD       irinotecan (CAMPTOSAR) 300 mg in sodium chloride 0.9 % 500 mL chemo infusion  150 mg/m2 (Treatment Plan Recorded) Intravenous Once [name], MD       leucovorin 812 mg in sodium chloride 0.9 % 250 mL infusion  400 mg/m2 (Treatment Plan Recorded) Intravenous Once [name], MD       oxaliplatin (ELOXATIN) 60 mg in dextrose 5 %  500 mL chemo infusion  30 mg/m2 (Treatment Plan Recorded) Intravenous Once [name], MD       palonosetron (ALOXI) injection 0.25 mg  0.25 mg Intravenous Once [name], MD       prochlorperazine (COMPAZINE) injection 10 mg  10 mg Intravenous Once [name], MD       sodium chloride flush (NS) 0.9 % injection 10 mL  10 mL Intracatheter PRN [name], MD        PHYSICAL EXAMINATION: ECOG PERFORMANCE STATUS: 1 - Symptomatic but completely ambulatory  Vitals:   08/15/22 0958  BP: 137/76  Pulse: 75  Resp: 16  Temp: 97.8 F (36.6 C)  SpO2: 99%   Wt Readings from Last 3 Encounters:  08/15/22 185 lb 1.6 oz (84 kg)  07/25/22 181 lb 12.8 oz (82.5 kg)  07/18/22 182 lb 3.2 oz (82.6 kg)     GENERAL:alert, no distress and comfortable SKIN: skin color normal, no rashes or significant lesions EYES: normal, Conjunctiva are pink and non-injected, sclera clear  NEURO: alert & oriented x 3 with fluent speech  LABORATORY DATA:  I have reviewed the data as listed    Latest Ref Rng & Units 08/15/2022    9:45 AM 07/25/2022    9:02 AM 07/04/2022    9:33 AM  CBC  WBC 4.0 - 10.5 K/uL 3.4  5.0  3.5   Hemoglobin 13.0 - 17.0 g/dL 12.3  13.9  12.1   Hematocrit 39.0 -  52.0 % 34.8  39.4  35.0   Platelets 150 - 400 K/uL 85  108  83         Latest Ref Rng & Units 08/15/2022    9:45 AM 07/25/2022    9:02 AM 07/16/2022    9:17 AM  CMP  Glucose 70 - 99 mg/dL 290  79  77   BUN 8 - 23 mg/dL 14  18    Creatinine 0.61 - 1.24 mg/dL 0.87  0.91    Sodium 135 - 145 mmol/L 137  140    Potassium 3.5 - 5.1 mmol/L 4.2  3.6    Chloride 98 - 111 mmol/L 103  109    CO2 22 - 32 mmol/L 27  27    Calcium 8.9 - 10.3 mg/dL 9.1  9.4    Total Protein 6.5 - 8.1 g/dL 6.8  6.8    Total Bilirubin 0.3 - 1.2 mg/dL 0.5  0.5    Alkaline Phos 38 - 126 U/L 82  81    AST 15 - 41 U/L 21  21    ALT 0 - 44 U/L 21  23        RADIOGRAPHIC STUDIES: I have personally reviewed the radiological images as listed and agreed with the findings in the report. No results found.    No orders of the defined types were placed in this encounter.  All questions were answered. The patient knows to call the clinic with any problems, questions or concerns. No barriers to learning was detected. The total time spent in the appointment was 40 minutes.     [name], MD 08/15/2022   I, Wilburn Mylar, am acting as scribe for [name], MD.   I have reviewed the above documentation for accuracy and completeness, and I agree with the above.

## 2022-08-15 NOTE — Progress Notes (Signed)
Per Dr Burr Medico, ok to treat with platelet 85 today.

## 2022-08-17 ENCOUNTER — Inpatient Hospital Stay: Payer: Medicare Other

## 2022-08-17 VITALS — BP 125/67 | HR 72 | Temp 97.7°F | Resp 18 | Ht 71.0 in

## 2022-08-17 DIAGNOSIS — Z5111 Encounter for antineoplastic chemotherapy: Secondary | ICD-10-CM | POA: Diagnosis not present

## 2022-08-17 DIAGNOSIS — C259 Malignant neoplasm of pancreas, unspecified: Secondary | ICD-10-CM

## 2022-08-17 LAB — CANCER ANTIGEN 19-9: CA 19-9: 173 U/mL — ABNORMAL HIGH (ref 0–35)

## 2022-08-17 MED ORDER — HEPARIN SOD (PORK) LOCK FLUSH 100 UNIT/ML IV SOLN
500.0000 [IU] | Freq: Once | INTRAVENOUS | Status: AC | PRN
  Administered 2022-08-17: 500 [IU]

## 2022-08-17 MED ORDER — SODIUM CHLORIDE 0.9% FLUSH
10.0000 mL | INTRAVENOUS | Status: DC | PRN
  Administered 2022-08-17: 10 mL

## 2022-08-19 ENCOUNTER — Other Ambulatory Visit: Payer: Self-pay | Admitting: Hematology

## 2022-08-19 DIAGNOSIS — C259 Malignant neoplasm of pancreas, unspecified: Secondary | ICD-10-CM

## 2022-08-20 ENCOUNTER — Telehealth: Payer: Self-pay | Admitting: Hematology

## 2022-08-20 NOTE — Telephone Encounter (Signed)
Left patient a voicemail regarding upcoming appointments  

## 2022-08-22 ENCOUNTER — Other Ambulatory Visit: Payer: Self-pay | Admitting: Hematology

## 2022-08-22 ENCOUNTER — Encounter: Payer: Self-pay | Admitting: Hematology

## 2022-08-22 MED ORDER — TRAMADOL HCL 50 MG PO TABS: 50.0000 mg | ORAL_TABLET | Freq: Four times a day (QID) | ORAL | 0 refills | Status: DC | PRN

## 2022-09-04 ENCOUNTER — Other Ambulatory Visit: Payer: Self-pay | Admitting: Endocrinology

## 2022-09-04 MED FILL — Dexamethasone Sodium Phosphate Inj 100 MG/10ML: INTRAMUSCULAR | Qty: 1 | Status: AC

## 2022-09-04 MED FILL — Fosaprepitant Dimeglumine For IV Infusion 150 MG (Base Eq): INTRAVENOUS | Qty: 5 | Status: AC

## 2022-09-05 ENCOUNTER — Inpatient Hospital Stay: Payer: Medicare Other | Attending: Hematology

## 2022-09-05 ENCOUNTER — Inpatient Hospital Stay (HOSPITAL_BASED_OUTPATIENT_CLINIC_OR_DEPARTMENT_OTHER): Payer: Medicare Other | Admitting: Adult Health

## 2022-09-05 ENCOUNTER — Inpatient Hospital Stay: Payer: Medicare Other

## 2022-09-05 ENCOUNTER — Encounter: Payer: Self-pay | Admitting: Adult Health

## 2022-09-05 ENCOUNTER — Other Ambulatory Visit: Payer: Self-pay

## 2022-09-05 DIAGNOSIS — Z8041 Family history of malignant neoplasm of ovary: Secondary | ICD-10-CM | POA: Insufficient documentation

## 2022-09-05 DIAGNOSIS — Z95828 Presence of other vascular implants and grafts: Secondary | ICD-10-CM

## 2022-09-05 DIAGNOSIS — Z5111 Encounter for antineoplastic chemotherapy: Secondary | ICD-10-CM | POA: Insufficient documentation

## 2022-09-05 DIAGNOSIS — C250 Malignant neoplasm of head of pancreas: Secondary | ICD-10-CM | POA: Diagnosis not present

## 2022-09-05 DIAGNOSIS — C259 Malignant neoplasm of pancreas, unspecified: Secondary | ICD-10-CM | POA: Diagnosis not present

## 2022-09-05 DIAGNOSIS — Z800 Family history of malignant neoplasm of digestive organs: Secondary | ICD-10-CM | POA: Insufficient documentation

## 2022-09-05 DIAGNOSIS — I700 Atherosclerosis of aorta: Secondary | ICD-10-CM | POA: Insufficient documentation

## 2022-09-05 DIAGNOSIS — N200 Calculus of kidney: Secondary | ICD-10-CM | POA: Insufficient documentation

## 2022-09-05 DIAGNOSIS — E785 Hyperlipidemia, unspecified: Secondary | ICD-10-CM | POA: Diagnosis not present

## 2022-09-05 DIAGNOSIS — K7689 Other specified diseases of liver: Secondary | ICD-10-CM | POA: Diagnosis not present

## 2022-09-05 DIAGNOSIS — K589 Irritable bowel syndrome without diarrhea: Secondary | ICD-10-CM | POA: Diagnosis not present

## 2022-09-05 DIAGNOSIS — Z8042 Family history of malignant neoplasm of prostate: Secondary | ICD-10-CM | POA: Diagnosis not present

## 2022-09-05 DIAGNOSIS — N182 Chronic kidney disease, stage 2 (mild): Secondary | ICD-10-CM | POA: Diagnosis not present

## 2022-09-05 DIAGNOSIS — I129 Hypertensive chronic kidney disease with stage 1 through stage 4 chronic kidney disease, or unspecified chronic kidney disease: Secondary | ICD-10-CM | POA: Diagnosis not present

## 2022-09-05 DIAGNOSIS — E119 Type 2 diabetes mellitus without complications: Secondary | ICD-10-CM | POA: Diagnosis not present

## 2022-09-05 DIAGNOSIS — R918 Other nonspecific abnormal finding of lung field: Secondary | ICD-10-CM | POA: Insufficient documentation

## 2022-09-05 LAB — CMP (CANCER CENTER ONLY)
ALT: 24 U/L (ref 0–44)
AST: 23 U/L (ref 15–41)
Albumin: 3.9 g/dL (ref 3.5–5.0)
Alkaline Phosphatase: 79 U/L (ref 38–126)
Anion gap: 7 (ref 5–15)
BUN: 16 mg/dL (ref 8–23)
CO2: 26 mmol/L (ref 22–32)
Calcium: 8.8 mg/dL — ABNORMAL LOW (ref 8.9–10.3)
Chloride: 107 mmol/L (ref 98–111)
Creatinine: 0.89 mg/dL (ref 0.61–1.24)
GFR, Estimated: >60 mL/min (ref >60)
Glucose, Bld: 151 mg/dL — ABNORMAL HIGH (ref 70–99)
Potassium: 3.7 mmol/L (ref 3.5–5.1)
Sodium: 140 mmol/L (ref 135–145)
Total Bilirubin: 0.6 mg/dL (ref 0.3–1.2)
Total Protein: 6.5 g/dL (ref 6.5–8.1)

## 2022-09-05 LAB — CBC WITH DIFFERENTIAL (CANCER CENTER ONLY)
Abs Immature Granulocytes: 0.01 10*3/uL (ref 0.00–0.07)
Basophils Absolute: 0 10*3/uL (ref 0.0–0.1)
Basophils Relative: 1 %
Eosinophils Absolute: 0.1 10*3/uL (ref 0.0–0.5)
Eosinophils Relative: 3 %
HCT: 35 % — ABNORMAL LOW (ref 39.0–52.0)
Hemoglobin: 12.4 g/dL — ABNORMAL LOW (ref 13.0–17.0)
Immature Granulocytes: 0 %
Lymphocytes Relative: 27 %
Lymphs Abs: 1.1 10*3/uL (ref 0.7–4.0)
MCH: 33.7 pg (ref 26.0–34.0)
MCHC: 35.4 g/dL (ref 30.0–36.0)
MCV: 95.1 fL (ref 80.0–100.0)
Monocytes Absolute: 0.5 10*3/uL (ref 0.1–1.0)
Monocytes Relative: 11 %
Neutro Abs: 2.4 10*3/uL (ref 1.7–7.7)
Neutrophils Relative %: 58 %
Platelet Count: 103 10*3/uL — ABNORMAL LOW (ref 150–400)
RBC: 3.68 MIL/uL — ABNORMAL LOW (ref 4.22–5.81)
RDW: 12.6 % (ref 11.5–15.5)
WBC Count: 4.2 10*3/uL (ref 4.0–10.5)
nRBC: 0 % (ref 0.0–0.2)

## 2022-09-05 MED ORDER — OXALIPLATIN CHEMO INJECTION 100 MG/20ML
30.0000 mg/m2 | Freq: Once | INTRAVENOUS | Status: AC
  Administered 2022-09-05: 60 mg via INTRAVENOUS
  Filled 2022-09-05: qty 12

## 2022-09-05 MED ORDER — LEUCOVORIN CALCIUM INJECTION 350 MG
400.0000 mg/m2 | Freq: Once | INTRAVENOUS | Status: AC
  Administered 2022-09-05: 812 mg via INTRAVENOUS
  Filled 2022-09-05: qty 17.5

## 2022-09-05 MED ORDER — SODIUM CHLORIDE 0.9 % IV SOLN
400.0000 mg/m2 | Freq: Once | INTRAVENOUS | Status: DC
  Filled 2022-09-05: qty 40.6

## 2022-09-05 MED ORDER — PROCHLORPERAZINE EDISYLATE 10 MG/2ML IJ SOLN
10.0000 mg | Freq: Once | INTRAMUSCULAR | Status: AC
  Administered 2022-09-05: 10 mg via INTRAVENOUS
  Filled 2022-09-05: qty 2

## 2022-09-05 MED ORDER — SODIUM CHLORIDE 0.9 % IV SOLN
10.0000 mg | Freq: Once | INTRAVENOUS | Status: AC
  Administered 2022-09-05: 10 mg via INTRAVENOUS
  Filled 2022-09-05: qty 10

## 2022-09-05 MED ORDER — SODIUM CHLORIDE 0.9 % IV SOLN
50.0000 mg/m2 | Freq: Once | INTRAVENOUS | Status: AC
  Administered 2022-09-05: 103.2 mg via INTRAVENOUS
  Filled 2022-09-05: qty 24

## 2022-09-05 MED ORDER — SODIUM CHLORIDE 0.9% FLUSH
10.0000 mL | Freq: Once | INTRAVENOUS | Status: AC
  Administered 2022-09-05: 10 mL via INTRAVENOUS

## 2022-09-05 MED ORDER — ATROPINE SULFATE 1 MG/ML IV SOLN: 0.5000 mg | Freq: Once | INTRAVENOUS | Status: DC | PRN

## 2022-09-05 MED ORDER — SODIUM CHLORIDE 0.9% FLUSH
10.0000 mL | INTRAVENOUS | Status: DC | PRN
  Administered 2022-09-05: 10 mL

## 2022-09-05 MED ORDER — LEUCOVORIN CALCIUM INJECTION 350 MG
400.0000 mg/m2 | Freq: Once | INTRAVENOUS | Status: DC
  Filled 2022-09-05 (×2): qty 40.6

## 2022-09-05 MED ORDER — SODIUM CHLORIDE 0.9 % IV SOLN
1800.0000 mg/m2 | INTRAVENOUS | Status: DC
  Administered 2022-09-05: 3650 mg via INTRAVENOUS
  Filled 2022-09-05: qty 73

## 2022-09-05 MED ORDER — DEXTROSE 5 % IV SOLN: Freq: Once | INTRAVENOUS | Status: AC

## 2022-09-05 MED ORDER — PALONOSETRON HCL INJECTION 0.25 MG/5ML
0.2500 mg | Freq: Once | INTRAVENOUS | Status: AC
  Administered 2022-09-05: 0.25 mg via INTRAVENOUS
  Filled 2022-09-05: qty 5

## 2022-09-05 MED ORDER — SODIUM CHLORIDE 0.9 % IV SOLN
150.0000 mg | Freq: Once | INTRAVENOUS | Status: AC
  Administered 2022-09-05: 150 mg via INTRAVENOUS
  Filled 2022-09-05: qty 150

## 2022-09-05 NOTE — Patient Instructions (Signed)
[facility]  Discharge Instructions: Thank you for choosing [facility] to provide your oncology and hematology care.   If you have a lab appointment with the [facility], please go directly to the [facility] and check in at the registration area.   Wear comfortable clothing and clothing appropriate for easy access to any Portacath or PICC line.   We strive to give you quality time with your provider. You may need to reschedule your appointment if you arrive late (15 or more minutes).  Arriving late affects you and other patients whose appointments are after yours.  Also, if you miss three or more appointments without notifying the office, you may be dismissed from the clinic at the provider's discretion.      For prescription refill requests, have your pharmacy contact our office and allow 72 hours for refills to be completed.    Today you received the following chemotherapy and/or immunotherapy agents: oxaliplatin, leucovorin, irinotecan, fluorouracil      To help prevent nausea and vomiting after your treatment, we encourage you to take your nausea medication as directed.  BELOW ARE SYMPTOMS THAT SHOULD BE REPORTED IMMEDIATELY: *FEVER GREATER THAN 100.4 F (38 C) OR HIGHER *CHILLS OR SWEATING *NAUSEA AND VOMITING THAT IS NOT CONTROLLED WITH YOUR NAUSEA MEDICATION *UNUSUAL SHORTNESS OF BREATH *UNUSUAL BRUISING OR BLEEDING *URINARY PROBLEMS (pain or burning when urinating, or frequent urination) *BOWEL PROBLEMS (unusual diarrhea, constipation, pain near the anus) TENDERNESS IN MOUTH AND THROAT WITH OR WITHOUT PRESENCE OF ULCERS (sore throat, sores in mouth, or a toothache) UNUSUAL RASH, SWELLING OR PAIN  UNUSUAL VAGINAL DISCHARGE OR ITCHING   Items with * indicate a potential emergency and should be followed up as soon as possible or go to the Emergency Department if any problems should occur.  Please show the CHEMOTHERAPY ALERT CARD or  IMMUNOTHERAPY ALERT CARD at check-in to the Emergency Department and triage nurse.  Should you have questions after your visit or need to cancel or reschedule your appointment, please contact [facility]  Dept: [phone]  and follow the prompts.  Office hours are 8:00 a.m. to 4:30 p.m. Monday - Friday. Please note that voicemails left after 4:00 p.m. may not be returned until the following business day.  We are closed weekends and major holidays. You have access to a nurse at all times for urgent questions. Please call the main number to the clinic Dept: [phone] and follow the prompts.   For any non-urgent questions, you may also contact your provider using MyChart. We now offer e-Visits for anyone 47 and older to request care online for non-urgent symptoms. For details visit [email or web address].   Also download the MyChart app! Go to the app store, search "MyChart", open the app, select [facility], and log in with your MyChart username and password.  Masks are optional in the cancer centers. If you would like for your care team to wear a mask while they are taking care of you, please let them know. You may have one support person who is at least 66 years old accompany you for your appointments.

## 2022-09-05 NOTE — Assessment & Plan Note (Signed)
Zachary Reid is a 66 year old man with stage IV pancreatic adenocarcinoma currently receiving treatment with folfirinox every 3 weeks.    He will proceed with treatment today.  His labs are stable, c-Met is pending and he will receive treatment so long as his labs are within parameters.  We discussed his pain management and I recommended that he take 1 Aleve as much is 3 times a day as needed with the tramadol instead of ibuprofen.  The pain near his right chest wall is likely musculoskeletal in nature.  If it returns or recurs he will let me know we will order some plain films to look at the rib area.  He tells me he does not have any known cancer in the bones.  Zachary Reid will return in 3 weeks for labs, follow-up, and his next treatment.

## 2022-09-05 NOTE — Progress Notes (Signed)
[facility] Follow up:    Zachary Pinto, MD [address] [place] [address]   DIAGNOSIS:  Cancer Staging  Pancreatic adenocarcinoma [facility]) Staging form: Exocrine Pancreas, AJCC 8th Edition - Clinical stage from 07/26/2021: Stage IV (cT4, cN0, cM1) - Signed by [name], MD on 07/28/2021 Stage prefix: Initial diagnosis Total positive nodes: 0   SUMMARY OF ONCOLOGIC HISTORY: Oncology History Overview Note   Cancer Staging  Pancreatic adenocarcinoma [facility]) Staging form: Exocrine Pancreas, AJCC 8th Edition - Clinical stage from 07/26/2021: Stage IV (cT4, cN0, cM1) - Signed by [name], MD on 07/28/2021    Pancreatic adenocarcinoma ([place])  07/02/2021 Initial Diagnosis   Pancreatic adenocarcinoma ([place])   07/26/2021 Cancer Staging   Staging form: Exocrine Pancreas, AJCC 8th Edition - Clinical stage from 07/26/2021: Stage IV (cT4, cN0, cM1) - Signed by [name], MD on 07/28/2021 Stage prefix: Initial diagnosis Total positive nodes: 0   08/13/2021 - 07/06/2022 Chemotherapy   Patient is on Treatment Plan : PANCREAS Modified FOLFIRINOX q14d x 4 cycles      Genetic Testing   [facility] CancerNext-Expanded results (77 genes) were negative. No pathogenic variants were identified. 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 The report date is 08/15/2021.    The CancerNext-Expanded gene panel offered by [facility] and includes sequencing, rearrangement, and RNA analysis for the following 77 genes: AIP, ALK, APC, ATM, AXIN2, BAP1, BARD1, BLM, BMPR1A, BRCA1, BRCA2, BRIP1, CDC73, CDH1, CDK4, CDKN1B, CDKN2A, CHEK2, CTNNA1, DICER1, FANCC, FH, FLCN, GALNT12, KIF1B, LZTR1, MAX, MEN1, MET, MLH1, MSH2, MSH3, MSH6, MUTYH, NBN, NF1, NF2, NTHL1, PALB2, PHOX2B, PMS2, POT1, PRKAR1A, PTCH1, PTEN, RAD51C, RAD51D, RB1, RECQL, RET, SDHA, SDHAF2, SDHB, SDHC, SDHD, SMAD4, SMARCA4, SMARCB1, SMARCE1, STK11, SUFU, TMEM127, TP53, TSC1, TSC2, VHL and XRCC2  (sequencing and deletion/duplication); EGFR, EGLN1, HOXB13, KIT, MITF, PDGFRA, POLD1, and POLE (sequencing only); EPCAM and GREM1 (deletion/duplication only).     08/13/2021 -  Chemotherapy   Patient is on Treatment Plan : PANCREAS Modified FOLFIRINOX q14d x 4 cycles     10/25/2021 Imaging   EXAM: CT CHEST, ABDOMEN, AND PELVIS WITH CONTRAST  IMPRESSION: 1. Innumerable bilateral pulmonary nodules, many of which are cavitary, consistent with metastatic disease. These nodules show no substantial change in are minimally progressed in the interval. 2. Mix cystic and solid lesion in the head and body of the pancreas is similar to prior and also comparing back to MRI 07/02/2021. 3. Hepatic cysts. 4. 8 mm nonobstructing left renal stone. 5. Aortic Atherosclerosis (ICD10-I70.0).   01/28/2022 Imaging   EXAM: CT CHEST, ABDOMEN, AND PELVIS WITH CONTRAST  IMPRESSION: 1. Innumerable bilateral small solid and cavitary pulmonary nodules, some of the cavitary nodules are slightly less thick-walled when compared with prior exam. 2. Mixed cystic and solid lesion in the head and body of the pancreas is similar to prior exam. 3. Nonobstructing left renal stone. 4.  Aortic Atherosclerosis (ICD10-I70.0).     CURRENT THERAPY: FOLFIRINOX  INTERVAL HISTORY: Zachary Reid 66 y.o. male returns for follow-up of his metastatic pancreatic adenocarcinoma.  He is receiving chemotherapy every 3 weeks.  Zachary Reid is doing moderately well today.  His labs are stable.  He denies any significant issues.  He does have some pain that he notes in his back from his pancreatic cancer.  He is taking ibuprofen 3 tablets 3 times a day in addition to tramadol with ibuprofen.  He is to know if there is any different medication  he should be taking.  He also notes that he was waxing several cars and developed pain in his right chest wall that was over rib and in between her rib.  This pain has resolved over the past 2  days.   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  MEDICAL HISTORY: Past Medical History:  Diagnosis Date   Abnormal EKG    DM (diabetes mellitus) (HCC)    Hyperlipidemia    Hypogonadism male    IBS (irritable bowel syndrome)    Obesity    BMI 31   pancreatic ca 06/28/2021   Vitamin D deficiency     SURGICAL HISTORY: Past Surgical History:  Procedure Laterality Date   APPENDECTOMY     BIOPSY  07/26/2021   Procedure: BIOPSY;  Surgeon: Lemar Lofty., MD;  Location: [facility] ENDOSCOPY;  Service: Gastroenterology;;   COLONOSCOPY WITH PROPOFOL N/A 07/26/2021   Procedure: COLONOSCOPY WITH PROPOFOL;  Surgeon: Lemar Lofty., MD;  Location: [facility] ENDOSCOPY;  Service: Gastroenterology;  Laterality: N/A;   ESOPHAGOGASTRODUODENOSCOPY (EGD) WITH PROPOFOL N/A 07/26/2021   Procedure: ESOPHAGOGASTRODUODENOSCOPY (EGD) WITH PROPOFOL;  Surgeon: Meridee Score Netty Starring., MD;  Location: [facility] ENDOSCOPY;  Service: Gastroenterology;  Laterality: N/A;   EUS N/A 07/26/2021   Procedure: UPPER ENDOSCOPIC ULTRASOUND (EUS) RADIAL;  Surgeon: Lemar Lofty., MD;  Location: [facility] ENDOSCOPY;   Service: Gastroenterology;  Laterality: N/A;   FINE NEEDLE ASPIRATION  07/26/2021   Procedure: FINE NEEDLE ASPIRATION (FNA) LINEAR;  Surgeon: Lemar Lofty., MD;  Location: [facility] ENDOSCOPY;  Service: Gastroenterology;;   IR IMAGING GUIDED PORT INSERTION  08/10/2021   POLYPECTOMY  07/26/2021   Procedure: POLYPECTOMY;  Surgeon: Lemar Lofty., MD;  Location: [facility] ENDOSCOPY;  Service: Gastroenterology;;   SHOULDER SURGERY Right     SOCIAL HISTORY: Social History   Socioeconomic History   Marital status: Married    Spouse name: Not on file   Number of children: 1   Years of education: Not on file   Highest education level: Not on file  Occupational History   Not on file  Tobacco Use   Smoking status: Never   Smokeless tobacco: Never  Substance and Sexual Activity   Alcohol use: No   Drug use: No   Sexual activity: Never  Other Topics Concern   Not on file  Social History Narrative   Not on file   Social Determinants of Health   Financial Resource Strain: Not on file  Food Insecurity: Not on file  Transportation Needs: Not on file  Physical Activity: Not on file  Stress: Not on file  Social Connections: Not on file  Intimate Partner Violence: Not on file    FAMILY HISTORY: Family History  Problem Relation Age of Onset   Pancreatic cancer Mother 37   Prostate cancer Father 21       Prostate   Diabetes Father    Hypertension Father    Drug abuse Brother    Kidney Stones Brother    Ovarian cancer Paternal Grandmother 26   Lung cancer Paternal Grandfather        Cigar smoker   Colon cancer Neg Hx    Colon polyps Neg Hx    Esophageal cancer Neg Hx    Stomach cancer Neg Hx    Rectal cancer Neg Hx     Review of Systems  Constitutional:  Positive for fatigue. Negative for appetite change, chills, fever and unexpected weight change.  HENT:   Negative for hearing loss, lump/mass and trouble swallowing.   Eyes:  Negative for eye problems and icterus.   Respiratory:  Negative for chest tightness, cough and shortness of breath.   Cardiovascular:  Negative for chest pain, leg swelling and palpitations.  Gastrointestinal:  Negative for abdominal distention, abdominal pain, constipation, diarrhea, nausea and vomiting.  Endocrine: Negative for hot flashes.  Genitourinary:  Negative for difficulty urinating.   Musculoskeletal:  Negative for arthralgias.  Skin:  Negative for itching and rash.  Neurological:  Negative for dizziness, extremity weakness, headaches and numbness.  Hematological:  Negative for adenopathy. Does not bruise/bleed easily.  Psychiatric/Behavioral:  Negative for depression. The patient is not nervous/anxious.       PHYSICAL EXAMINATION  ECOG PERFORMANCE STATUS: 1 - Symptomatic but completely ambulatory  Vitals:   09/05/22 0847  BP: 130/71  Pulse: 85  Resp: 18  Temp: 97.7 F (36.5 C)  SpO2: 100%    Physical Exam Constitutional:      General: He is not in acute distress.    Appearance: Normal appearance. He is not toxic-appearing.  HENT:     Head: Normocephalic and atraumatic.  Eyes:     General: No scleral icterus. Cardiovascular:     Rate and Rhythm: Normal rate and regular rhythm.     Pulses: Normal pulses.     Heart sounds: Normal heart sounds.  Pulmonary:     Effort: Pulmonary effort is normal.     Breath sounds: Normal breath sounds.  Abdominal:     General: Abdomen is flat. Bowel sounds are normal. There is no distension.     Palpations: Abdomen is soft.     Tenderness: There is no abdominal tenderness.  Musculoskeletal:        General: No swelling.     Cervical back: Neck supple.  Lymphadenopathy:     Cervical: No cervical adenopathy.  Skin:    General: Skin is warm and dry.     Findings: No rash.  Neurological:     General: No focal deficit present.     Mental Status: He is alert.  Psychiatric:        Mood and Affect: Mood normal.        Behavior: Behavior normal.     LABORATORY  DATA:  CBC    Component Value Date/Time   WBC 4.2 09/05/2022 0831   WBC 5.2 06/05/2021 1130   RBC 3.68 (L) 09/05/2022 0831   HGB 12.4 (L) 09/05/2022 0831   HCT 35.0 (L) 09/05/2022 0831   PLT 103 (L) 09/05/2022 0831   MCV 95.1 09/05/2022 0831   MCH 33.7 09/05/2022 0831   MCHC 35.4 09/05/2022 0831   RDW 12.6 09/05/2022 0831   LYMPHSABS 1.1 09/05/2022 0831  MONOABS 0.5 09/05/2022 0831   EOSABS 0.1 09/05/2022 0831   BASOSABS 0.0 09/05/2022 0831    CMP     Component Value Date/Time   NA 140 09/05/2022 0831   K 3.7 09/05/2022 0831   CL 107 09/05/2022 0831   CO2 26 09/05/2022 0831   GLUCOSE 151 (H) 09/05/2022 0831   BUN 16 09/05/2022 0831   CREATININE 0.89 09/05/2022 0831   CREATININE 0.88 06/05/2021 1130   CALCIUM 8.8 (L) 09/05/2022 0831   PROT 6.5 09/05/2022 0831   ALBUMIN 3.9 09/05/2022 0831   AST 23 09/05/2022 0831   ALT 24 09/05/2022 0831   ALKPHOS 79 09/05/2022 0831   BILITOT 0.6 09/05/2022 0831   GFRNONAA >60 09/05/2022 0831   GFRNONAA 96 03/05/2021 0915   GFRAA 112 03/05/2021 0915       ASSESSMENT and THERAPY PLAN:   Pancreatic adenocarcinoma (HCC) Zachary Reid is a 66 year old man with stage IV pancreatic adenocarcinoma currently receiving treatment with folfirinox every 3 weeks.    He will proceed with treatment today.  His labs are stable, c-Met is pending and he will receive treatment so long as his labs are within parameters.  We discussed his pain management and I recommended that he take 1 Aleve as much is 3 times a day as needed with the tramadol instead of ibuprofen.  The pain near his right chest wall is likely musculoskeletal in nature.  If it returns or recurs he will let me know we will order some plain films to look at the rib area.  He tells me he does not have any known cancer in the bones.  Zachary Reid will return in 3 weeks for labs, follow-up, and his next treatment.    All questions were answered. The patient knows to call the clinic with any  problems, questions or concerns. We can certainly see the patient much sooner if necessary.  Total encounter time:30 minutes*in face-to-face visit time, chart review, lab review, care coordination, order entry, and documentation of the encounter time.  Wilber Bihari, NP 09/05/22 9:32 AM Medical Oncology and Hematology [facility] [place], [place] [address] Tel. [phone]    Fax. [phone]  *Total Encounter Time as defined by the Centers for Medicare and Medicaid Services includes, in addition to the face-to-face time of a patient visit (documented in the note above) non-face-to-face time: obtaining and reviewing outside history, ordering and reviewing medications, tests or procedures, care coordination (communications with other health care professionals or caregivers) and documentation in the medical record.

## 2022-09-07 ENCOUNTER — Inpatient Hospital Stay: Payer: Medicare Other

## 2022-09-07 VITALS — BP 144/83 | HR 97 | Temp 97.2°F | Resp 16

## 2022-09-07 DIAGNOSIS — C259 Malignant neoplasm of pancreas, unspecified: Secondary | ICD-10-CM

## 2022-09-07 DIAGNOSIS — Z5111 Encounter for antineoplastic chemotherapy: Secondary | ICD-10-CM | POA: Diagnosis not present

## 2022-09-07 LAB — CANCER ANTIGEN 19-9: CA 19-9: 178 U/mL — ABNORMAL HIGH (ref 0–35)

## 2022-09-07 MED ORDER — SODIUM CHLORIDE 0.9% FLUSH
10.0000 mL | INTRAVENOUS | Status: DC | PRN
  Administered 2022-09-07: 10 mL

## 2022-09-07 MED ORDER — HEPARIN SOD (PORK) LOCK FLUSH 100 UNIT/ML IV SOLN
500.0000 [IU] | Freq: Once | INTRAVENOUS | Status: AC | PRN
  Administered 2022-09-07: 500 [IU]

## 2022-09-11 NOTE — Progress Notes (Signed)
OK to inf LV w/ Oxaliplatin over 2 hrs per Dr. Burr Medico.  Kennith Center, Pharm.D., CPP 09/11/2022'@4'$ :16 PM

## 2022-09-16 ENCOUNTER — Encounter: Payer: Self-pay | Admitting: Hematology

## 2022-09-17 ENCOUNTER — Other Ambulatory Visit: Payer: Self-pay | Admitting: Hematology

## 2022-09-17 ENCOUNTER — Encounter: Payer: Self-pay | Admitting: Hematology

## 2022-09-18 MED ORDER — ALPRAZOLAM 0.25 MG PO TABS: 0.2500 mg | ORAL_TABLET | Freq: Two times a day (BID) | ORAL | 0 refills | Status: DC | PRN

## 2022-09-18 MED ORDER — TRAMADOL HCL 50 MG PO TABS: 50.0000 mg | ORAL_TABLET | Freq: Four times a day (QID) | ORAL | 0 refills | Status: DC | PRN

## 2022-09-18 MED ORDER — ZOLPIDEM TARTRATE 10 MG PO TABS: 10.0000 mg | ORAL_TABLET | Freq: Every evening | ORAL | 0 refills | Status: DC | PRN

## 2022-09-25 MED FILL — Fosaprepitant Dimeglumine For IV Infusion 150 MG (Base Eq): INTRAVENOUS | Qty: 5 | Status: AC

## 2022-09-25 MED FILL — Dexamethasone Sodium Phosphate Inj 100 MG/10ML: INTRAMUSCULAR | Qty: 1 | Status: AC

## 2022-09-26 ENCOUNTER — Inpatient Hospital Stay (HOSPITAL_BASED_OUTPATIENT_CLINIC_OR_DEPARTMENT_OTHER): Payer: Medicare Other | Admitting: Hematology

## 2022-09-26 ENCOUNTER — Inpatient Hospital Stay: Payer: Medicare Other | Attending: Hematology

## 2022-09-26 ENCOUNTER — Encounter: Payer: Self-pay | Admitting: Hematology

## 2022-09-26 ENCOUNTER — Inpatient Hospital Stay: Payer: Medicare Other

## 2022-09-26 ENCOUNTER — Other Ambulatory Visit: Payer: Self-pay

## 2022-09-26 VITALS — BP 128/73 | HR 71 | Temp 98.5°F | Resp 18 | Ht 71.0 in | Wt 185.1 lb

## 2022-09-26 DIAGNOSIS — E669 Obesity, unspecified: Secondary | ICD-10-CM | POA: Insufficient documentation

## 2022-09-26 DIAGNOSIS — Z5111 Encounter for antineoplastic chemotherapy: Secondary | ICD-10-CM | POA: Diagnosis present

## 2022-09-26 DIAGNOSIS — Z794 Long term (current) use of insulin: Secondary | ICD-10-CM | POA: Diagnosis not present

## 2022-09-26 DIAGNOSIS — I700 Atherosclerosis of aorta: Secondary | ICD-10-CM | POA: Diagnosis not present

## 2022-09-26 DIAGNOSIS — C259 Malignant neoplasm of pancreas, unspecified: Secondary | ICD-10-CM | POA: Diagnosis not present

## 2022-09-26 DIAGNOSIS — K589 Irritable bowel syndrome without diarrhea: Secondary | ICD-10-CM | POA: Insufficient documentation

## 2022-09-26 DIAGNOSIS — N200 Calculus of kidney: Secondary | ICD-10-CM | POA: Diagnosis not present

## 2022-09-26 DIAGNOSIS — R5383 Other fatigue: Secondary | ICD-10-CM | POA: Insufficient documentation

## 2022-09-26 DIAGNOSIS — E785 Hyperlipidemia, unspecified: Secondary | ICD-10-CM | POA: Insufficient documentation

## 2022-09-26 DIAGNOSIS — K7689 Other specified diseases of liver: Secondary | ICD-10-CM | POA: Diagnosis not present

## 2022-09-26 DIAGNOSIS — E1140 Type 2 diabetes mellitus with diabetic neuropathy, unspecified: Secondary | ICD-10-CM | POA: Diagnosis not present

## 2022-09-26 DIAGNOSIS — E860 Dehydration: Secondary | ICD-10-CM

## 2022-09-26 DIAGNOSIS — C7800 Secondary malignant neoplasm of unspecified lung: Secondary | ICD-10-CM | POA: Diagnosis not present

## 2022-09-26 DIAGNOSIS — Z95828 Presence of other vascular implants and grafts: Secondary | ICD-10-CM

## 2022-09-26 DIAGNOSIS — Z79899 Other long term (current) drug therapy: Secondary | ICD-10-CM | POA: Insufficient documentation

## 2022-09-26 LAB — CMP (CANCER CENTER ONLY)
ALT: 25 U/L (ref 0–44)
AST: 23 U/L (ref 15–41)
Albumin: 3.9 g/dL (ref 3.5–5.0)
Alkaline Phosphatase: 85 U/L (ref 38–126)
Anion gap: 5 (ref 5–15)
BUN: 15 mg/dL (ref 8–23)
CO2: 26 mmol/L (ref 22–32)
Calcium: 9 mg/dL (ref 8.9–10.3)
Chloride: 110 mmol/L (ref 98–111)
Creatinine: 0.94 mg/dL (ref 0.61–1.24)
GFR, Estimated: >60 mL/min (ref >60)
Glucose, Bld: 174 mg/dL — ABNORMAL HIGH (ref 70–99)
Potassium: 4.3 mmol/L (ref 3.5–5.1)
Sodium: 141 mmol/L (ref 135–145)
Total Bilirubin: 0.6 mg/dL (ref 0.3–1.2)
Total Protein: 6.6 g/dL (ref 6.5–8.1)

## 2022-09-26 LAB — CBC WITH DIFFERENTIAL (CANCER CENTER ONLY)
Abs Immature Granulocytes: 0.01 10*3/uL (ref 0.00–0.07)
Basophils Absolute: 0 10*3/uL (ref 0.0–0.1)
Basophils Relative: 1 %
Eosinophils Absolute: 0.2 10*3/uL (ref 0.0–0.5)
Eosinophils Relative: 4 %
HCT: 37.1 % — ABNORMAL LOW (ref 39.0–52.0)
Hemoglobin: 13.3 g/dL (ref 13.0–17.0)
Immature Granulocytes: 0 %
Lymphocytes Relative: 27 %
Lymphs Abs: 1 10*3/uL (ref 0.7–4.0)
MCH: 34.8 pg — ABNORMAL HIGH (ref 26.0–34.0)
MCHC: 35.8 g/dL (ref 30.0–36.0)
MCV: 97.1 fL (ref 80.0–100.0)
Monocytes Absolute: 0.5 10*3/uL (ref 0.1–1.0)
Monocytes Relative: 14 %
Neutro Abs: 2.1 10*3/uL (ref 1.7–7.7)
Neutrophils Relative %: 54 %
Platelet Count: 117 10*3/uL — ABNORMAL LOW (ref 150–400)
RBC: 3.82 MIL/uL — ABNORMAL LOW (ref 4.22–5.81)
RDW: 13.3 % (ref 11.5–15.5)
WBC Count: 3.9 10*3/uL — ABNORMAL LOW (ref 4.0–10.5)
nRBC: 0 % (ref 0.0–0.2)

## 2022-09-26 MED ORDER — PROCHLORPERAZINE EDISYLATE 10 MG/2ML IJ SOLN
10.0000 mg | Freq: Once | INTRAMUSCULAR | Status: AC
  Administered 2022-09-26: 10 mg via INTRAVENOUS
  Filled 2022-09-26: qty 2

## 2022-09-26 MED ORDER — OXALIPLATIN CHEMO INJECTION 100 MG/20ML
30.0000 mg/m2 | Freq: Once | INTRAVENOUS | Status: AC
  Administered 2022-09-26: 60 mg via INTRAVENOUS
  Filled 2022-09-26: qty 12

## 2022-09-26 MED ORDER — SODIUM CHLORIDE 0.9 % IV SOLN
10.0000 mg | Freq: Once | INTRAVENOUS | Status: AC
  Administered 2022-09-26: 10 mg via INTRAVENOUS
  Filled 2022-09-26: qty 10

## 2022-09-26 MED ORDER — SODIUM CHLORIDE 0.9 % IV SOLN
60.0000 mg/m2 | Freq: Once | INTRAVENOUS | Status: AC
  Administered 2022-09-26: 120.4 mg via INTRAVENOUS
  Filled 2022-09-26: qty 28

## 2022-09-26 MED ORDER — SODIUM CHLORIDE 0.9% FLUSH
10.0000 mL | Freq: Once | INTRAVENOUS | Status: AC
  Administered 2022-09-26: 10 mL

## 2022-09-26 MED ORDER — DEXTROSE 5 % IV SOLN: Freq: Once | INTRAVENOUS | Status: AC

## 2022-09-26 MED ORDER — PALONOSETRON HCL INJECTION 0.25 MG/5ML
0.2500 mg | Freq: Once | INTRAVENOUS | Status: AC
  Administered 2022-09-26: 0.25 mg via INTRAVENOUS
  Filled 2022-09-26: qty 5

## 2022-09-26 MED ORDER — LEUCOVORIN CALCIUM INJECTION 350 MG
400.0000 mg/m2 | Freq: Once | INTRAVENOUS | Status: AC
  Administered 2022-09-26: 812 mg via INTRAVENOUS
  Filled 2022-09-26: qty 17.5

## 2022-09-26 MED ORDER — SODIUM CHLORIDE 0.9 % IV SOLN
1800.0000 mg/m2 | INTRAVENOUS | Status: DC
  Administered 2022-09-26: 3650 mg via INTRAVENOUS
  Filled 2022-09-26: qty 73

## 2022-09-26 MED ORDER — SODIUM CHLORIDE 0.9 % IV SOLN
150.0000 mg | Freq: Once | INTRAVENOUS | Status: AC
  Administered 2022-09-26: 150 mg via INTRAVENOUS
  Filled 2022-09-26: qty 150

## 2022-09-26 MED ORDER — OXYCODONE HCL 5 MG PO TABS: 5.0000 mg | ORAL_TABLET | Freq: Four times a day (QID) | ORAL | 0 refills | Status: DC | PRN

## 2022-09-26 NOTE — Progress Notes (Signed)
[facility]   Telephone:([phone]) [phone] Fax:(336) [phone]   Clinic Follow up Note   Patient Care Team: [name], MD as PCP - General (Internal Medicine) [name], NP as PCP - Hematology/Oncology (Nurse Practitioner) [name], MD as Consulting Physician (Orthopedic Surgery) [name], MD as Consulting Physician (Cardiology) [name], MD as Consulting Physician (Oncology)  Date of Service:  09/26/2022  CHIEF COMPLAINT: f/u of metastatic pancreatic cancer   CURRENT THERAPY:  First line FOLFIRINOX, starting 08/13/21, now q3weeks   -irinotecan switched to liposomal 09/26/22  ASSESSMENT & PLAN:  Zachary Reid is a 66 y.o. male with   1. Metastatic pancreatic adenocarcinoma, [id number] with numerous small cavitary lesions in bilateral lungs, MMR normal  -diagnosed 07/2021, after incidental finding on CT scan for kidney stone, by colonoscopy/EUS. -Baseline CA 19-9 elevated to 273 on 07/03/21 -He began first-line mFOLFIRINOX on 08/13/21, but required dose reduction and move to every 3 weeks. -his CA 19-9 trended up but is now stable. -restaging CT CAP 08/12/22 showed: multiple new and enlarging pulmonary metastases; pancreatic mass grossly stable. Will repeat in 3 months. -we changed his irinotecan to liposomal on 09/05/22. He tolerated better and feels stronger. -labs reviewed, improved, ANC and hgb WNL, plt up to 117k. Adequate to proceed with treatment, will increase dose to 60 mg/m2. -plan for restaging scan in early 11/2022, after holidays.   2. 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 He is underwent a form of light therapy, which he feels was helpful, and is on low-dose gabapentin. -he is now using Aleve up to TID for pain, has tramadol and oxycodone to use as needed.   3. DM -he is now followed by Dr. Loanne Drilling, now on metformin and glucotrol. -BG improved but remains elevated      PLAN: -proceed with FOLFIRINOX with increased liposomal irinotecan to 60 mg/m2. -I refilled oxycodone  -lab, flush, f/u, and FOLFIRINOX on 11/30 and 12/27, per pt request             -they prefer Thursday morning appointments   No problem-specific Assessment & Plan notes found for this encounter.   SUMMARY OF ONCOLOGIC HISTORY: Oncology History Overview Note   Cancer Staging  Pancreatic adenocarcinoma [facility]) Staging form: Exocrine Pancreas, AJCC 8th Edition - Clinical stage from 07/26/2021: Stage IV (cT4, cN0, cM1) - Signed by [name], MD on 07/28/2021    Pancreatic adenocarcinoma ([place])  07/02/2021 Initial Diagnosis   Pancreatic adenocarcinoma ([place])   07/26/2021 Cancer Staging   Staging form: Exocrine Pancreas, AJCC 8th Edition - Clinical stage from 07/26/2021: Stage IV (cT4, cN0, cM1) - Signed by [name], MD on 07/28/2021 Stage prefix: Initial diagnosis Total positive nodes: 0   08/13/2021 - 07/06/2022 Chemotherapy   Patient is on Treatment Plan : PANCREAS Modified FOLFIRINOX q14d x 4 cycles      Genetic Testing   [facility] CancerNext-Expanded results (77 genes) were negative. No pathogenic variants were identified. 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 The report date is 08/15/2021.    The CancerNext-Expanded gene panel offered by [facility] and includes sequencing, rearrangement, and RNA analysis for the following 77 genes: AIP, ALK, APC, ATM, AXIN2, BAP1, BARD1, BLM, BMPR1A, BRCA1, BRCA2, BRIP1, CDC73, CDH1, CDK4, CDKN1B, CDKN2A, CHEK2, CTNNA1, DICER1, FANCC, FH, FLCN, GALNT12, KIF1B, LZTR1, MAX, MEN1, MET, MLH1, MSH2, MSH3, MSH6, MUTYH, NBN, NF1, NF2, NTHL1, PALB2, PHOX2B, PMS2, POT1, PRKAR1A, PTCH1, PTEN, RAD51C, RAD51D, RB1,  RECQL, RET, SDHA, SDHAF2, SDHB, SDHC, SDHD, SMAD4, SMARCA4, SMARCB1, SMARCE1, STK11, SUFU, TMEM127, TP53, TSC1, TSC2, VHL and XRCC2 (sequencing and deletion/duplication); EGFR, EGLN1, HOXB13, KIT, MITF, PDGFRA, POLD1, and  POLE (sequencing only); EPCAM and GREM1 (deletion/duplication only).     08/13/2021 -  Chemotherapy   Patient is on Treatment Plan : PANCREAS Modified FOLFIRINOX q14d x 4 cycles     10/25/2021 Imaging   EXAM: CT CHEST, ABDOMEN, AND PELVIS WITH CONTRAST  IMPRESSION: 1. Innumerable bilateral pulmonary nodules, many of which are cavitary, consistent with metastatic disease. These nodules show no substantial change in are minimally progressed in the interval. 2. Mix cystic and solid lesion in the head and body of the pancreas is similar to prior and also comparing back to MRI 07/02/2021. 3. Hepatic cysts. 4. 8 mm nonobstructing left renal stone. 5. Aortic Atherosclerosis (ICD10-I70.0).   01/28/2022 Imaging   EXAM: CT CHEST, ABDOMEN, AND PELVIS WITH CONTRAST  IMPRESSION: 1. Innumerable bilateral small solid and cavitary pulmonary nodules, some of the cavitary nodules are slightly less thick-walled when compared with prior exam. 2. Mixed cystic and solid lesion in the head and body of the pancreas is similar to prior exam. 3. Nonobstructing left renal stone. 4.  Aortic Atherosclerosis (ICD10-I70.0).      INTERVAL HISTORY:  Zachary Reid is here for a follow up of metastatic pancreatic cancer. He was last seen by NP Mendel Ryder on 09/05/22. He presents to the clinic accompanied by his wife. He reports he did better with last cycle, with the liposomal irinotecan. He feels he got stronger. He tells me he is taking Aleve for his pain, per recommendation from NP [facility].   All other systems were reviewed with the patient and are negative.  MEDICAL HISTORY:  Past Medical History:  Diagnosis Date   Abnormal EKG    DM (diabetes mellitus) ([place])    Hyperlipidemia    Hypogonadism male    IBS (irritable bowel syndrome)    Obesity    BMI 31   pancreatic ca 06/28/2021   Vitamin D deficiency     SURGICAL HISTORY: Past Surgical History:  Procedure Laterality Date   APPENDECTOMY     BIOPSY   07/26/2021   Procedure: BIOPSY;  Surgeon: Irving Copas., MD;  Location: [place];  Service: Gastroenterology;;   COLONOSCOPY WITH PROPOFOL N/A 07/26/2021   Procedure: COLONOSCOPY WITH PROPOFOL;  Surgeon: Irving Copas., MD;  Location: [place];  Service: Gastroenterology;  Laterality: N/A;   ESOPHAGOGASTRODUODENOSCOPY (EGD) WITH PROPOFOL N/A 07/26/2021   Procedure: ESOPHAGOGASTRODUODENOSCOPY (EGD) WITH PROPOFOL;  Surgeon: Rush Landmark Telford Nab., MD;  Location: [place];  Service: Gastroenterology;  Laterality: N/A;   EUS N/A 07/26/2021   Procedure: UPPER ENDOSCOPIC ULTRASOUND (EUS) RADIAL;  Surgeon: Irving Copas., MD;  Location: [place];  Service: Gastroenterology;  Laterality: N/A;   FINE NEEDLE ASPIRATION  07/26/2021   Procedure: FINE NEEDLE ASPIRATION (FNA) LINEAR;  Surgeon: Rush Landmark Telford Nab., MD;  Location: [place];  Service: Gastroenterology;;   IR IMAGING GUIDED PORT INSERTION  08/10/2021   POLYPECTOMY  07/26/2021   Procedure: POLYPECTOMY;  Surgeon: Rush Landmark Telford Nab., MD;  Location: [place];  Service: Gastroenterology;;   SHOULDER SURGERY Right     I have reviewed the social history and family history with the patient and they are unchanged from previous note.  ALLERGIES:  is allergic to lipitor [atorvastatin].  MEDICATIONS:  Current Outpatient Medications  Medication Sig Dispense Refill   ALPRAZolam (XANAX) 0.25 MG tablet Take 1-2 tablets (  0.25-0.5 mg total) by mouth 2 (two) times daily as needed for anxiety. 60 tablet 0   Cholecalciferol (VITAMIN D3) 125 MCG (5000 UT) CAPS Take 5,000 Units by mouth in the morning and at bedtime.     gabapentin (NEURONTIN) 600 MG tablet Take 1 tablet (600 mg total) by mouth 3 (three) times daily. 90 tablet 1   glucose blood test strip Use as instructed 100 each 12   hyoscyamine (LEVSIN SL) 0.125 MG SL tablet DISSOLVE ONE TABLET UNDER THE TONGUE 4 TIMES DAILY UP TO EVERY 4 HOURS AS NEEDED FOR  NAUSEA, BLOATING, CRAMPING, OR DIARRHEA 100 tablet 0   insulin lispro (HUMALOG KWIKPEN) 100 UNIT/ML KwikPen 10 TO 15 UNITS BEFORE MEALS AS DIRECTED 15 mL 1   ondansetron (ZOFRAN ODT) 4 MG disintegrating tablet Take 1 tablet (4 mg total) by mouth every 8 (eight) hours as needed for nausea or vomiting. (Patient not taking: Reported on 05/22/2022) 20 tablet 0   oxyCODONE (ROXICODONE) 5 MG immediate release tablet Take 1 tablet (5 mg total) by mouth every 6 (six) hours as needed for up to 30 doses for severe pain. 30 tablet 0   traMADol (ULTRAM) 50 MG tablet Take 1-2 tablets (50-100 mg total) by mouth every 6 (six) hours as needed. 120 tablet 0   TRESIBA FLEXTOUCH 100 UNIT/ML FlexTouch Pen INJECT 30 UNITS EVERY MORNING AND ADJUST AS DIRECTED, MAXIMUM DAILY DOSE 50 UNITS 15 mL 1   zolpidem (AMBIEN) 10 MG tablet Take 1 tablet (10 mg total) by mouth at bedtime as needed for sleep. 30 tablet 0   No current facility-administered medications for this visit.   Facility-Administered Medications Ordered in Other Visits  Medication Dose Route Frequency Provider Last Rate Last Admin   fluorouracil (ADRUCIL) 3,650 mg in sodium chloride 0.9 % 77 mL chemo infusion  1,800 mg/m2 (Treatment Plan Recorded) Intravenous 1 day or 1 dose [name], MD       fosaprepitant (EMEND) 150 mg in sodium chloride 0.9 % 145 mL IVPB  150 mg Intravenous Once [name], MD 450 mL/hr at 09/26/22 1149 150 mg at 09/26/22 1149   irinotecan LIPOSOME (ONIVYDE) 120.4 mg in sodium chloride 0.9 % 500 mL chemo infusion  60 mg/m2 (Treatment Plan Recorded) Intravenous Once [name], MD       leucovorin 812 mg in dextrose 5 % 250 mL infusion  400 mg/m2 (Treatment Plan Recorded) Intravenous Once [name], MD       oxaliplatin (ELOXATIN) 60 mg in dextrose 5 % 500 mL chemo infusion  30 mg/m2 (Treatment Plan Recorded) Intravenous Once [name], MD        PHYSICAL EXAMINATION: ECOG PERFORMANCE STATUS: 1 - Symptomatic but completely  ambulatory  Vitals:   09/26/22 1015  BP: 128/73  Pulse: 71  Resp: 18  Temp: 98.5 F (36.9 C)  SpO2: 98%   Wt Readings from Last 3 Encounters:  09/26/22 185 lb 1.6 oz (84 kg)  09/05/22 184 lb 9.6 oz (83.7 kg)  08/15/22 185 lb 1.6 oz (84 kg)     GENERAL:alert, no distress and comfortable SKIN: skin color normal, no rashes or significant lesions EYES: normal, Conjunctiva are pink and non-injected, sclera clear  NEURO: alert & oriented x 3 with fluent speech  LABORATORY DATA:  I have reviewed the data as listed    Latest Ref Rng & Units 09/26/2022    9:57 AM 09/05/2022    8:31 AM 08/15/2022    9:45 AM  CBC  WBC  4.0 - 10.5 K/uL 3.9  4.2  3.4   Hemoglobin 13.0 - 17.0 g/dL 13.3  12.4  12.3   Hematocrit 39.0 - 52.0 % 37.1  35.0  34.8   Platelets 150 - 400 K/uL 117  103  85         Latest Ref Rng & Units 09/26/2022    9:57 AM 09/05/2022    8:31 AM 08/15/2022    9:45 AM  CMP  Glucose 70 - 99 mg/dL 174  151  290   BUN 8 - 23 mg/dL _0 Creatinine 0.61 - 1.24 mg/dL 0.94  0.89  0.87   Sodium 135 - 145 mmol/L 141  140  137   Potassium 3.5 - 5.1 mmol/L 4.3  3.7  4.2   Chloride 98 - 111 mmol/L 110  107  103   CO2 22 - 32 mmol/L _1 Calcium 8.9 - 10.3 mg/dL 9.0  8.8  9.1   Total Protein 6.5 - 8.1 g/dL 6.6  6.5  6.8   Total Bilirubin 0.3 - 1.2 mg/dL 0.6  0.6  0.5   Alkaline Phos 38 - 126 U/L 85  79  82   AST 15 - 41 U/L _2 ALT 0 - 44 U/L _3 RADIOGRAPHIC STUDIES: I have personally reviewed the radiological images as listed and agreed with the findings in the report. No results found.    Orders Placed This Encounter  Procedures   CBC with Differential ([facility] Only)    Standing Status:   Future    Standing Expiration Date:   11/14/2023   CMP ([place] [facility] only)    Standing Status:   Future    Standing Expiration Date:   11/14/2023   All questions were answered. The patient knows to call the clinic with any problems,  questions or concerns. No barriers to learning was detected. The total time spent in the appointment was 30 minutes.     [name], MD 09/26/2022   I, Wilburn Mylar, am acting as scribe for [name], MD.   I have reviewed the above documentation for accuracy and completeness, and I agree with the above.

## 2022-09-26 NOTE — Patient Instructions (Signed)
[facility]  Discharge Instructions: Thank you for choosing [facility] to provide your oncology and hematology care.   If you have a lab appointment with the [facility], please go directly to the [facility] and check in at the registration area.   Wear comfortable clothing and clothing appropriate for easy access to any Portacath or PICC line.   We strive to give you quality time with your provider. You may need to reschedule your appointment if you arrive late (15 or more minutes).  Arriving late affects you and other patients whose appointments are after yours.  Also, if you miss three or more appointments without notifying the office, you may be dismissed from the clinic at the provider's discretion.      For prescription refill requests, have your pharmacy contact our office and allow 72 hours for refills to be completed.    Today you received the following chemotherapy and/or immunotherapy agents: Liposomal irinotecan, Oxaliplatin, Leucovorin, Fluorouracil.       To help prevent nausea and vomiting after your treatment, we encourage you to take your nausea medication as directed.  BELOW ARE SYMPTOMS THAT SHOULD BE REPORTED IMMEDIATELY: *FEVER GREATER THAN 100.4 F (38 C) OR HIGHER *CHILLS OR SWEATING *NAUSEA AND VOMITING THAT IS NOT CONTROLLED WITH YOUR NAUSEA MEDICATION *UNUSUAL SHORTNESS OF BREATH *UNUSUAL BRUISING OR BLEEDING *URINARY PROBLEMS (pain or burning when urinating, or frequent urination) *BOWEL PROBLEMS (unusual diarrhea, constipation, pain near the anus) TENDERNESS IN MOUTH AND THROAT WITH OR WITHOUT PRESENCE OF ULCERS (sore throat, sores in mouth, or a toothache) UNUSUAL RASH, SWELLING OR PAIN  UNUSUAL VAGINAL DISCHARGE OR ITCHING   Items with * indicate a potential emergency and should be followed up as soon as possible or go to the Emergency Department if any problems should occur.  Please show the CHEMOTHERAPY  ALERT CARD or IMMUNOTHERAPY ALERT CARD at check-in to the Emergency Department and triage nurse.  Should you have questions after your visit or need to cancel or reschedule your appointment, please contact [facility]  Dept: [phone]  and follow the prompts.  Office hours are 8:00 a.m. to 4:30 p.m. Monday - Friday. Please note that voicemails left after 4:00 p.m. may not be returned until the following business day.  We are closed weekends and major holidays. You have access to a nurse at all times for urgent questions. Please call the main number to the clinic Dept: [phone] and follow the prompts.   For any non-urgent questions, you may also contact your provider using MyChart. We now offer e-Visits for anyone 62 and older to request care online for non-urgent symptoms. For details visit [email or web address].   Also download the MyChart app! Go to the app store, search "MyChart", open the app, select [facility], and log in with your MyChart username and password.  Masks are optional in the cancer centers. If you would like for your care team to wear a mask while they are taking care of you, please let them know. You may have one support person who is at least 66 years old accompany you for your appointments.

## 2022-09-27 LAB — CANCER ANTIGEN 19-9: CA 19-9: 227 U/mL — ABNORMAL HIGH (ref 0–35)

## 2022-09-28 ENCOUNTER — Inpatient Hospital Stay: Payer: Medicare Other

## 2022-09-28 VITALS — BP 135/78 | HR 69 | Temp 97.2°F | Resp 16 | Ht 71.0 in

## 2022-09-28 DIAGNOSIS — Z5111 Encounter for antineoplastic chemotherapy: Secondary | ICD-10-CM | POA: Diagnosis not present

## 2022-09-28 DIAGNOSIS — C259 Malignant neoplasm of pancreas, unspecified: Secondary | ICD-10-CM

## 2022-09-28 MED ORDER — SODIUM CHLORIDE 0.9% FLUSH
10.0000 mL | INTRAVENOUS | Status: DC | PRN
  Administered 2022-09-28: 10 mL

## 2022-09-28 MED ORDER — HEPARIN SOD (PORK) LOCK FLUSH 100 UNIT/ML IV SOLN
500.0000 [IU] | Freq: Once | INTRAVENOUS | Status: AC | PRN
  Administered 2022-09-28: 500 [IU]

## 2022-10-16 MED FILL — Fosaprepitant Dimeglumine For IV Infusion 150 MG (Base Eq): INTRAVENOUS | Qty: 5 | Status: AC

## 2022-10-16 MED FILL — Dexamethasone Sodium Phosphate Inj 100 MG/10ML: INTRAMUSCULAR | Qty: 1 | Status: AC

## 2022-10-16 NOTE — Progress Notes (Unsigned)
[name]   [phone] [phone]   Clinic Follow up Note   Patient Care Team: [name], MD as PCP - General (Internal Medicine) [name], NP as PCP - Hematology/Oncology (Nurse Practitioner) [name], MD as Consulting Physician (Orthopedic Surgery) [name], MD as Consulting Physician (Cardiology) [name], MD as Consulting Physician (Oncology)  Date of Service:  10/17/2022  CHIEF COMPLAINT: f/u of metastatic pancreatic cancer     CURRENT THERAPY:  First line FOLFIRINOX, starting 08/13/21, now q3weeks              -irinotecan switched to liposomal 09/26/22   ASSESSMENT:  Zachary Reid is a 66 y.o. male with    1. Metastatic pancreatic adenocarcinoma, [id number] with numerous small cavitary lesions in bilateral lungs, MMR normal  -diagnosed 07/2021, after incidental finding on CT scan for kidney stone, by colonoscopy/EUS. -Baseline CA 19-9 elevated to 273 on 07/03/21 -He began first-line mFOLFIRINOX on 08/13/21, but required dose reduction and move to every 3 weeks. -his CA 19-9 trended up but is now stable. -restaging CT CAP 08/12/22 showed: multiple new and enlarging pulmonary metastases; pancreatic mass grossly stable. Will repeat in 3 months. -we changed his irinotecan to liposomal on 09/05/22. He tolerated better and feels stronger. -He did not tolerated Lip-irrinotecan at 60 mg/m2 dose at C2, will reduce back to 65m/m2 from cycle 3  -plan for restaging scan in early 11/2022, after holidays.   2. 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 He is underwent a form of light therapy, which he feels was helpful, and is on low-dose gabapentin. -he is now using Aleve up to TID for pain, has tramadol and oxycodone to use as needed.   3. DM -he is now followed by Dr. ELoanne Drilling now on metformin and glucotrol. -BG improved but remains elevated  4. Left kidney stone -He  has had 2 episode of left flank pain, related to his kidney stone -[facility]  No problem-specific Assessment & Plan notes found for this encounter.     PLAN: - Discuss reduce dose of chemo liposomal irinotecan back to 50 mg/m -Discuss alternative regiment Gemzar/abraxane in future  -proceed with C3 with decrease liposomal irinotecan 50 mg/m2 -  refill,Xanax,Tramadol,Ambien Lab, flush, f/u and chemo FOLFIRINOX with (lip-irinotecan) on 12/04/22 with pump d/c on day 3 (postpone due to holiday)   SUMMARY OF ONCOLOGIC HISTORY: Oncology History Overview Note   Cancer Staging  Pancreatic adenocarcinoma ([facility] Staging form: Exocrine Pancreas, AJCC 8th Edition - Clinical stage from 07/26/2021: Stage IV (cT4, cN0, cM1) - Signed by FTruitt Merle MD on 07/28/2021    [place] ([facility]  07/02/2021 Initial Diagnosis   [place] ([facility]   07/26/2021 Cancer Staging   Staging form: Exocrine Pancreas, AJCC 8th Edition - Clinical stage from 07/26/2021: Stage IV (cT4, cN0, cM1) - Signed by FTruitt Merle MD on 07/28/2021 Stage prefix: Initial diagnosis Total positive nodes: 0   08/13/2021 - 07/06/2022 Chemotherapy   Patient is on Treatment Plan : PANCREAS Modified FOLFIRINOX q14d x 4 cycles      Genetic Testing   [facility] CancerNext-Expanded results (77 genes) were negative. No pathogenic variants were identified. 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 The report date is 08/15/2021.    The [facility]Exp[facility] panel [facility] includes sequencing, rearrangement, and RNA analysis for the following 77 genes: AIP, ALK, APC, ATM, AXIN2, BAP1,  BARD1, BLM, BMPR1A, BRCA1, BRCA2, BRIP1, CDC73, CDH1, CDK4, CDKN1B, CDKN2A, CHEK2, CTNNA1, DICER1, FANCC, FH, FLCN, GALNT12, KIF1B, LZTR1, MAX, MEN1, MET, MLH1, MSH2, MSH3, MSH6, MUTYH, NBN, NF1, NF2, NTHL1, PALB2, PHOX2B, PMS2, POT1, PRKAR1A, PTCH1, PTEN, RAD51C, RAD51D, RB1,  RECQL, RET, SDHA, SDHAF2, SDHB, SDHC, SDHD, SMAD4, SMARCA4, SMARCB1, SMARCE1, STK11, SUFU, TMEM127, TP53, TSC1, TSC2, VHL and XRCC2 (sequencing and deletion/duplication); EGFR, EGLN1, HOXB13, KIT, MITF, PDGFRA, POLD1, and POLE (sequencing only); EPCAM and GREM1 (deletion/duplication only).     08/13/2021 -  Chemotherapy   Patient is on Treatment Plan : PANCREAS Modified FOLFIRINOX q14d x 4 cycles     10/25/2021 Imaging   EXAM: CT CHEST, ABDOMEN, AND PELVIS WITH CONTRAST  IMPRESSION: 1. Innumerable bilateral pulmonary nodules, many of which are cavitary, consistent with metastatic disease. These nodules show no substantial change in are minimally progressed in the interval. 2. Mix cystic and solid lesion in the head and body of the pancreas is similar to prior and also comparing back to MRI 07/02/2021. 3. Hepatic cysts. 4. 8 mm nonobstructing left renal stone. 5. Aortic Atherosclerosis (ICD10-I70.0).   01/28/2022 Imaging   EXAM: CT CHEST, ABDOMEN, AND PELVIS WITH CONTRAST  IMPRESSION: 1. Innumerable bilateral small solid and cavitary pulmonary nodules, some of the cavitary nodules are slightly less thick-walled when compared with prior exam. 2. Mixed cystic and solid lesion in the head and body of the pancreas is similar to prior exam. 3. Nonobstructing left renal stone. 4.  Aortic Atherosclerosis (ICD10-I70.0).      INTERVAL HISTORY:  Zachary Reid is here for a follow up of pancreatic cancer   He was last seen by me on 09/26/2022 He presents to the clinic accompanied by wife.Pt reports that the new treatment he didn't do so well. Pt reports of fatigue and poison. Pt also reports wasn't able to sleep, tried different sleep aids nothing seems to help.Pt asked about Pain in the kidney area.     All other systems were reviewed with the patient and are negative.  MEDICAL HISTORY:  Past Medical History:  Diagnosis Date   Abnormal EKG    DM (diabetes mellitus) ([place])     Hyperlipidemia    Hypogonadism male    IBS (irritable bowel syndrome)    Obesity    BMI 31   pancreatic ca 06/28/2021   Vitamin D deficiency     SURGICAL HISTORY: Past Surgical History:  Procedure Laterality Date   APPENDECTOMY     BIOPSY  07/26/2021   Procedure: BIOPSY;  Surgeon: Irving Copas., MD;  Location: [facility];  Service: Gastroenterology;;   COLONOSCOPY WITH PROPOFOL N/A 07/26/2021   Procedure: COLONOSCOPY WITH PROPOFOL;  Surgeon: Irving Copas., MD;  Location: [facility];  Service: Gastroenterology;  Laterality: N/A;   ESOPHAGOGASTRODUODENOSCOPY (EGD) WITH PROPOFOL N/A 07/26/2021   Procedure: ESOPHAGOGASTRODUODENOSCOPY (EGD) WITH PROPOFOL;  Surgeon: Rush Landmark Telford Nab., MD;  Location: [place];  Service: Gastroenterology;  Laterality: N/A;   EUS N/A 07/26/2021   Procedure: UPPER ENDOSCOPIC ULTRASOUND (EUS) RADIAL;  Surgeon: Irving Copas., MD;  Location: [facility];  Service: Gastroenterology;  Laterality: N/A;   FINE NEEDLE ASPIRATION  07/26/2021   Procedure: FINE NEEDLE ASPIRATION (FNA) LINEAR;  Surgeon: Irving Copas., MD;  Location: [facility];  Service: Gastroenterology;;   IR IMAGING GUIDED PORT INSERTION  08/10/2021   POLYPECTOMY  07/26/2021   Procedure: POLYPECTOMY;  Surgeon: Irving Copas., MD;  Location: [facility];  Service: Gastroenterology;;   SHOULDER SURGERY Right  I have reviewed the social history and family history with the patient and they are unchanged from previous note.  ALLERGIES:  is allergic to lipitor [atorvastatin].  MEDICATIONS:  Current Outpatient Medications  Medication Sig Dispense Refill   ALPRAZolam (XANAX) 0.25 MG tablet Take 1-2 tablets (0.25-0.5 mg total) by mouth 2 (two) times daily as needed for anxiety. 60 tablet 0   Cholecalciferol (VITAMIN D3) 125 MCG (5000 UT) CAPS Take 5,000 Units by mouth in the morning and at bedtime.     gabapentin (NEURONTIN) 600 MG tablet Take 1  tablet (600 mg total) by mouth 3 (three) times daily. 90 tablet 1   glucose blood test strip Use as instructed 100 each 12   hyoscyamine (LEVSIN SL) 0.125 MG SL tablet DISSOLVE ONE TABLET UNDER THE TONGUE 4 TIMES DAILY UP TO EVERY 4 HOURS AS NEEDED FOR NAUSEA, BLOATING, CRAMPING, OR DIARRHEA 100 tablet 0   insulin lispro (HUMALOG KWIKPEN) 100 UNIT/ML KwikPen 10 TO 15 UNITS BEFORE MEALS AS DIRECTED 15 mL 1   ondansetron (ZOFRAN ODT) 4 MG disintegrating tablet Take 1 tablet (4 mg total) by mouth every 8 (eight) hours as needed for nausea or vomiting. (Patient not taking: Reported on 05/22/2022) 20 tablet 0   oxyCODONE (ROXICODONE) 5 MG immediate release tablet Take 1 tablet (5 mg total) by mouth every 6 (six) hours as needed for up to 30 doses for severe pain. 30 tablet 0   traMADol (ULTRAM) 50 MG tablet Take 1-2 tablets (50-100 mg total) by mouth every 6 (six) hours as needed. 120 tablet 0   TRESIBA FLEXTOUCH 100 UNIT/ML FlexTouch Pen INJECT 30 UNITS EVERY MORNING AND ADJUST AS DIRECTED, MAXIMUM DAILY DOSE 50 UNITS 15 mL 1   zolpidem (AMBIEN) 10 MG tablet Take 1 tablet (10 mg total) by mouth at bedtime as needed for sleep. 30 tablet 0   No current facility-administered medications for this visit.   Facility-Administered Medications Ordered in Other Visits  Medication Dose Route Frequency Provider Last Rate Last Admin   atropine injection 0.5 mg  0.5 mg Intravenous Once PRN [name], MD       fluorouracil (ADRUCIL) 3,650 mg in sodium chloride 0.9 % 77 mL chemo infusion  1,800 mg/m2 (Treatment Plan Recorded) Intravenous 1 day or 1 dose [name], MD   Infusion Verify at 10/17/22 1620   sodium chloride flush (NS) 0.9 % injection 10 mL  10 mL Intracatheter PRN [name], MD   10 mL at 10/17/22 1608    PHYSICAL EXAMINATION: ECOG PERFORMANCE STATUS: 1 - Symptomatic but completely ambulatory  Vitals:   10/17/22 1041  BP: (!) 135/98  Pulse: 85  Resp: 18  Temp: 98.2 F (36.8 C)  SpO2: 99%   Wt  Readings from Last 3 Encounters:  10/17/22 182 lb 11.2 oz (82.9 kg)  09/26/22 185 lb 1.6 oz (84 kg)  09/05/22 184 lb 9.6 oz (83.7 kg)     GENERAL:alert, no distress and comfortable SKIN: skin color, texture, turgor are normal, no rashes or significant lesions LUNGS: clear to auscultation and percussion with normal breathing effort HEART: regular rate & rhythm and no murmurs and no lower extremity edema NEURO: alert & oriented x 3 with fluent speech, no focal motor/sensory deficits  LABORATORY DATA:  I have reviewed the data as listed    Latest Ref Rng & Units 10/17/2022   10:18 AM 09/26/2022    9:57 AM 09/05/2022    8:31 AM  CBC  WBC 4.0 - 10.5  K/uL 4.1  3.9  4.2   Hemoglobin 13.0 - 17.0 g/dL 12.8  13.3  12.4   Hematocrit 39.0 - 52.0 % 35.6  37.1  35.0   Platelets 150 - 400 K/uL 114  117  103         Latest Ref Rng & Units 10/17/2022   10:18 AM 09/26/2022    9:57 AM 09/05/2022    8:31 AM  CMP  Glucose 70 - 99 mg/dL 198  174  151   BUN 8 - 23 mg/dL _0 Creatinine 0.61 - 1.24 mg/dL 1.12  0.94  0.89   Sodium 135 - 145 mmol/L 142  141  140   Potassium 3.5 - 5.1 mmol/L 3.7  4.3  3.7   Chloride 98 - 111 mmol/L 107  110  107   CO2 22 - 32 mmol/L _1 Calcium 8.9 - 10.3 mg/dL 9.1  9.0  8.8   Total Protein 6.5 - 8.1 g/dL 7.0  6.6  6.5   Total Bilirubin 0.3 - 1.2 mg/dL 0.8  0.6  0.6   Alkaline Phos 38 - 126 U/L 95  85  79   AST 15 - 41 U/L _2 ALT 0 - 44 U/L _3 RADIOGRAPHIC STUDIES: I have personally reviewed the radiological images as listed and agreed with the findings in the report. No results found.    Orders Placed This Encounter  Procedures   CBC with Differential ([facility] Only)    Standing Status:   Future    Standing Expiration Date:   12/05/2023   CMP ([place] only)    Standing Status:   Future    Standing Expiration Date:   12/05/2023   Ambulatory referral to Urology    Referral Priority:   Routine     Referral Type:   Consultation    Referral Reason:   Specialty Services Required    Requested Specialty:   Urology    Number of Visits Requested:   1   All questions were answered. The patient knows to call the clinic with any problems, questions or concerns. No barriers to learning was detected. The total time spent in the appointment was 30 minutes.     [name], MD 10/17/2022   I, Audry Riles, CMA, am acting as scribe for [name], MD.   I have reviewed the above documentation for accuracy and completeness, and I agree with the above.

## 2022-10-17 ENCOUNTER — Other Ambulatory Visit: Payer: Self-pay

## 2022-10-17 ENCOUNTER — Inpatient Hospital Stay: Payer: Medicare Other

## 2022-10-17 ENCOUNTER — Inpatient Hospital Stay (HOSPITAL_BASED_OUTPATIENT_CLINIC_OR_DEPARTMENT_OTHER): Payer: Medicare Other | Admitting: Hematology

## 2022-10-17 VITALS — BP 132/76 | HR 74 | Resp 16

## 2022-10-17 VITALS — BP 135/98 | HR 85 | Temp 98.2°F | Resp 18 | Ht 71.0 in | Wt 182.7 lb

## 2022-10-17 DIAGNOSIS — C259 Malignant neoplasm of pancreas, unspecified: Secondary | ICD-10-CM | POA: Diagnosis not present

## 2022-10-17 DIAGNOSIS — Z5111 Encounter for antineoplastic chemotherapy: Secondary | ICD-10-CM | POA: Diagnosis not present

## 2022-10-17 DIAGNOSIS — Z95828 Presence of other vascular implants and grafts: Secondary | ICD-10-CM

## 2022-10-17 DIAGNOSIS — E860 Dehydration: Secondary | ICD-10-CM

## 2022-10-17 LAB — CBC WITH DIFFERENTIAL (CANCER CENTER ONLY)
Abs Immature Granulocytes: 0.01 10*3/uL (ref 0.00–0.07)
Basophils Absolute: 0 10*3/uL (ref 0.0–0.1)
Basophils Relative: 1 %
Eosinophils Absolute: 0.1 10*3/uL (ref 0.0–0.5)
Eosinophils Relative: 3 %
HCT: 35.6 % — ABNORMAL LOW (ref 39.0–52.0)
Hemoglobin: 12.8 g/dL — ABNORMAL LOW (ref 13.0–17.0)
Immature Granulocytes: 0 %
Lymphocytes Relative: 25 %
Lymphs Abs: 1 10*3/uL (ref 0.7–4.0)
MCH: 34.9 pg — ABNORMAL HIGH (ref 26.0–34.0)
MCHC: 36 g/dL (ref 30.0–36.0)
MCV: 97 fL (ref 80.0–100.0)
Monocytes Absolute: 0.5 10*3/uL (ref 0.1–1.0)
Monocytes Relative: 13 %
Neutro Abs: 2.4 10*3/uL (ref 1.7–7.7)
Neutrophils Relative %: 58 %
Platelet Count: 114 10*3/uL — ABNORMAL LOW (ref 150–400)
RBC: 3.67 MIL/uL — ABNORMAL LOW (ref 4.22–5.81)
RDW: 13.7 % (ref 11.5–15.5)
WBC Count: 4.1 10*3/uL (ref 4.0–10.5)
nRBC: 0 % (ref 0.0–0.2)

## 2022-10-17 LAB — CMP (CANCER CENTER ONLY)
ALT: 23 U/L (ref 0–44)
AST: 22 U/L (ref 15–41)
Albumin: 4.1 g/dL (ref 3.5–5.0)
Alkaline Phosphatase: 95 U/L (ref 38–126)
Anion gap: 7 (ref 5–15)
BUN: 17 mg/dL (ref 8–23)
CO2: 28 mmol/L (ref 22–32)
Calcium: 9.1 mg/dL (ref 8.9–10.3)
Chloride: 107 mmol/L (ref 98–111)
Creatinine: 1.12 mg/dL (ref 0.61–1.24)
GFR, Estimated: >60 mL/min (ref >60)
Glucose, Bld: 198 mg/dL — ABNORMAL HIGH (ref 70–99)
Potassium: 3.7 mmol/L (ref 3.5–5.1)
Sodium: 142 mmol/L (ref 135–145)
Total Bilirubin: 0.8 mg/dL (ref 0.3–1.2)
Total Protein: 7 g/dL (ref 6.5–8.1)

## 2022-10-17 MED ORDER — SODIUM CHLORIDE 0.9 % IV SOLN
150.0000 mg | Freq: Once | INTRAVENOUS | Status: AC
  Administered 2022-10-17: 150 mg via INTRAVENOUS
  Filled 2022-10-17: qty 150

## 2022-10-17 MED ORDER — SODIUM CHLORIDE 0.9 % IV SOLN
1800.0000 mg/m2 | INTRAVENOUS | Status: DC
  Administered 2022-10-17: 3650 mg via INTRAVENOUS
  Filled 2022-10-17: qty 73

## 2022-10-17 MED ORDER — SODIUM CHLORIDE 0.9 % IV SOLN: 5.0000 mg | Freq: Once | INTRAVENOUS | Status: DC

## 2022-10-17 MED ORDER — TRAMADOL HCL 50 MG PO TABS: 50.0000 mg | ORAL_TABLET | Freq: Four times a day (QID) | ORAL | 0 refills | Status: DC | PRN

## 2022-10-17 MED ORDER — PALONOSETRON HCL INJECTION 0.25 MG/5ML
0.2500 mg | Freq: Once | INTRAVENOUS | Status: AC
  Administered 2022-10-17: 0.25 mg via INTRAVENOUS
  Filled 2022-10-17: qty 5

## 2022-10-17 MED ORDER — DEXAMETHASONE SODIUM PHOSPHATE 10 MG/ML IJ SOLN
5.0000 mg | Freq: Once | INTRAMUSCULAR | Status: AC
  Administered 2022-10-17: 5 mg via INTRAVENOUS
  Filled 2022-10-17: qty 1

## 2022-10-17 MED ORDER — SODIUM CHLORIDE 0.9% FLUSH
10.0000 mL | INTRAVENOUS | Status: DC | PRN
  Administered 2022-10-17: 10 mL

## 2022-10-17 MED ORDER — LEUCOVORIN CALCIUM INJECTION 350 MG
400.0000 mg/m2 | Freq: Once | INTRAVENOUS | Status: AC
  Administered 2022-10-17: 812 mg via INTRAVENOUS
  Filled 2022-10-17: qty 25

## 2022-10-17 MED ORDER — PROCHLORPERAZINE EDISYLATE 10 MG/2ML IJ SOLN
10.0000 mg | Freq: Once | INTRAMUSCULAR | Status: AC
  Administered 2022-10-17: 10 mg via INTRAVENOUS
  Filled 2022-10-17: qty 2

## 2022-10-17 MED ORDER — SODIUM CHLORIDE 0.9 % IV SOLN
50.0000 mg/m2 | Freq: Once | INTRAVENOUS | Status: AC
  Administered 2022-10-17: 103.2 mg via INTRAVENOUS
  Filled 2022-10-17: qty 24

## 2022-10-17 MED ORDER — OXALIPLATIN CHEMO INJECTION 100 MG/20ML
30.0000 mg/m2 | Freq: Once | INTRAVENOUS | Status: AC
  Administered 2022-10-17: 60 mg via INTRAVENOUS
  Filled 2022-10-17: qty 12

## 2022-10-17 MED ORDER — ALPRAZOLAM 0.25 MG PO TABS: 0.2500 mg | ORAL_TABLET | Freq: Two times a day (BID) | ORAL | 0 refills | Status: DC | PRN

## 2022-10-17 MED ORDER — ZOLPIDEM TARTRATE 10 MG PO TABS: 10.0000 mg | ORAL_TABLET | Freq: Every evening | ORAL | 0 refills | Status: DC | PRN

## 2022-10-17 MED ORDER — DEXTROSE 5 % IV SOLN: Freq: Once | INTRAVENOUS | Status: AC

## 2022-10-17 MED ORDER — ATROPINE SULFATE 1 MG/ML IV SOLN
0.5000 mg | Freq: Once | INTRAVENOUS | Status: DC | PRN
  Filled 2022-10-17: qty 1

## 2022-10-17 MED ORDER — SODIUM CHLORIDE 0.9% FLUSH
10.0000 mL | Freq: Once | INTRAVENOUS | Status: AC
  Administered 2022-10-17: 10 mL

## 2022-10-17 NOTE — Patient Instructions (Signed)
[facility] ONCOLOGY  Discharge Instructions: Thank you for choosing [facility] to provide your oncology and hematology care.   If you have a lab appointment with the [facility], please go directly to the [facility] and check in at the registration area.   Wear comfortable clothing and clothing appropriate for easy access to any Portacath or PICC line.   We strive to give you quality time with your provider. You may need to reschedule your appointment if you arrive late (15 or more minutes).  Arriving late affects you and other patients whose appointments are after yours.  Also, if you miss three or more appointments without notifying the office, you may be dismissed from the clinic at the provider's discretion.      For prescription refill requests, have your pharmacy contact our office and allow 72 hours for refills to be completed.    Today you received the following chemotherapy and/or immunotherapy agents: Irinotecan/Oxaliplatin/Leucovorin/Fluorouracil      To help prevent nausea and vomiting after your treatment, we encourage you to take your nausea medication as directed.  BELOW ARE SYMPTOMS THAT SHOULD BE REPORTED IMMEDIATELY: *FEVER GREATER THAN 100.4 F (38 C) OR HIGHER *CHILLS OR SWEATING *NAUSEA AND VOMITING THAT IS NOT CONTROLLED WITH YOUR NAUSEA MEDICATION *UNUSUAL SHORTNESS OF BREATH *UNUSUAL BRUISING OR BLEEDING *URINARY PROBLEMS (pain or burning when urinating, or frequent urination) *BOWEL PROBLEMS (unusual diarrhea, constipation, pain near the anus) TENDERNESS IN MOUTH AND THROAT WITH OR WITHOUT PRESENCE OF ULCERS (sore throat, sores in mouth, or a toothache) UNUSUAL RASH, SWELLING OR PAIN  UNUSUAL VAGINAL DISCHARGE OR ITCHING   Items with * indicate a potential emergency and should be followed up as soon as possible or go to the Emergency Department if any problems should occur.  Please show the CHEMOTHERAPY ALERT CARD or  IMMUNOTHERAPY ALERT CARD at check-in to the Emergency Department and triage nurse.  Should you have questions after your visit or need to cancel or reschedule your appointment, please contact [facility]  Dept: [phone]  and follow the prompts.  Office hours are 8:00 a.m. to 4:30 p.m. Monday - Friday. Please note that voicemails left after 4:00 p.m. may not be returned until the following business day.  We are closed weekends and major holidays. You have access to a nurse at all times for urgent questions. Please call the main number to the clinic Dept: [phone] and follow the prompts.   For any non-urgent questions, you may also contact your provider using MyChart. We now offer e-Visits for anyone 70 and older to request care online for non-urgent symptoms. For details visit [email or web address].   Also download the MyChart app! Go to the app store, search "MyChart", open the app, select [facility], and log in with your MyChart username and password.  Masks are optional in the cancer centers. If you would like for your care team to wear a mask while they are taking care of you, please let them know. You may have one support person who is at least 66 years old accompany you for your appointments.

## 2022-10-17 NOTE — Patient Instructions (Signed)

## 2022-10-18 ENCOUNTER — Telehealth: Payer: Self-pay | Admitting: Hematology

## 2022-10-18 NOTE — Telephone Encounter (Signed)
Called patient to notify of upcoming appointments. Left voicemail with appointment information.

## 2022-10-19 ENCOUNTER — Inpatient Hospital Stay: Payer: Medicare Other | Attending: Hematology

## 2022-10-19 ENCOUNTER — Other Ambulatory Visit: Payer: Self-pay

## 2022-10-19 VITALS — BP 129/78 | HR 83 | Temp 97.2°F | Resp 18

## 2022-10-19 DIAGNOSIS — C259 Malignant neoplasm of pancreas, unspecified: Secondary | ICD-10-CM

## 2022-10-19 DIAGNOSIS — Z79899 Other long term (current) drug therapy: Secondary | ICD-10-CM | POA: Diagnosis not present

## 2022-10-19 DIAGNOSIS — C250 Malignant neoplasm of head of pancreas: Secondary | ICD-10-CM | POA: Insufficient documentation

## 2022-10-19 MED ORDER — SODIUM CHLORIDE 0.9% FLUSH
10.0000 mL | INTRAVENOUS | Status: DC | PRN
  Administered 2022-10-19: 10 mL

## 2022-10-19 MED ORDER — HEPARIN SOD (PORK) LOCK FLUSH 100 UNIT/ML IV SOLN
500.0000 [IU] | Freq: Once | INTRAVENOUS | Status: AC | PRN
  Administered 2022-10-19: 500 [IU]

## 2022-10-21 ENCOUNTER — Other Ambulatory Visit: Payer: Self-pay | Admitting: Endocrinology

## 2022-10-21 ENCOUNTER — Telehealth: Payer: Self-pay

## 2022-10-21 MED ORDER — NOVOLOG FLEXPEN 100 UNIT/ML ~~LOC~~ SOPN: PEN_INJECTOR | SUBCUTANEOUS | 2 refills | Status: DC

## 2022-10-21 NOTE — Telephone Encounter (Signed)
Patient called in states that insurance is changing and they will not cover Humalog but will novolog. Is it ok to send in Rx? Please advise

## 2022-10-21 NOTE — Telephone Encounter (Signed)
Rx sent to pharmacy   

## 2022-10-21 NOTE — Addendum Note (Signed)
Addended by: Cinda Quest on: 10/21/2022 11:44 AM   Modules accepted: Orders

## 2022-10-25 ENCOUNTER — Telehealth: Payer: Self-pay

## 2022-10-25 NOTE — Telephone Encounter (Signed)
Patient called in stating he has a UTI. Symptoms started yesterday with pain in the groin area, burning with urination and urine frequency. Patient is taking OTC meds and an antibiotic that he was given for last UTI. Patient stated his symptoms were better this morning but wanted to check and see if he was ok with his treatment. Instructed patient to continue and to call us if he gets any worse over the weekend and we would see him Monday.

## 2022-10-31 ENCOUNTER — Encounter (HOSPITAL_COMMUNITY): Payer: Self-pay | Admitting: Urology

## 2022-10-31 ENCOUNTER — Ambulatory Visit (HOSPITAL_BASED_OUTPATIENT_CLINIC_OR_DEPARTMENT_OTHER): Payer: Medicare Other | Admitting: Anesthesiology

## 2022-10-31 ENCOUNTER — Encounter (HOSPITAL_COMMUNITY)
Admission: RE | Disposition: A | Discharge status: Home / Self Care | Payer: Self-pay | Source: Home / Self Care | Attending: Urology

## 2022-10-31 ENCOUNTER — Ambulatory Visit (HOSPITAL_COMMUNITY): Payer: Medicare Other

## 2022-10-31 ENCOUNTER — Other Ambulatory Visit: Payer: Self-pay | Admitting: Urology

## 2022-10-31 ENCOUNTER — Ambulatory Visit (HOSPITAL_COMMUNITY)
Admission: RE | Admit: 2022-10-31 | Discharge: 2022-10-31 | Disposition: A | Discharge status: Home / Self Care | Payer: Medicare Other | Attending: Urology | Admitting: Urology

## 2022-10-31 ENCOUNTER — Ambulatory Visit (HOSPITAL_COMMUNITY): Payer: Medicare Other | Admitting: Anesthesiology

## 2022-10-31 DIAGNOSIS — I4891 Unspecified atrial fibrillation: Secondary | ICD-10-CM | POA: Diagnosis not present

## 2022-10-31 DIAGNOSIS — I129 Hypertensive chronic kidney disease with stage 1 through stage 4 chronic kidney disease, or unspecified chronic kidney disease: Secondary | ICD-10-CM

## 2022-10-31 DIAGNOSIS — N182 Chronic kidney disease, stage 2 (mild): Secondary | ICD-10-CM | POA: Diagnosis not present

## 2022-10-31 DIAGNOSIS — C259 Malignant neoplasm of pancreas, unspecified: Secondary | ICD-10-CM | POA: Insufficient documentation

## 2022-10-31 DIAGNOSIS — E119 Type 2 diabetes mellitus without complications: Secondary | ICD-10-CM | POA: Diagnosis not present

## 2022-10-31 DIAGNOSIS — I480 Paroxysmal atrial fibrillation: Secondary | ICD-10-CM

## 2022-10-31 DIAGNOSIS — Z7984 Long term (current) use of oral hypoglycemic drugs: Secondary | ICD-10-CM | POA: Diagnosis not present

## 2022-10-31 DIAGNOSIS — Z833 Family history of diabetes mellitus: Secondary | ICD-10-CM | POA: Diagnosis not present

## 2022-10-31 DIAGNOSIS — E1122 Type 2 diabetes mellitus with diabetic chronic kidney disease: Secondary | ICD-10-CM

## 2022-10-31 DIAGNOSIS — I10 Essential (primary) hypertension: Secondary | ICD-10-CM | POA: Diagnosis present

## 2022-10-31 DIAGNOSIS — N201 Calculus of ureter: Secondary | ICD-10-CM | POA: Diagnosis present

## 2022-10-31 DIAGNOSIS — Z01818 Encounter for other preprocedural examination: Secondary | ICD-10-CM

## 2022-10-31 HISTORY — PX: CYSTOSCOPY WITH STENT PLACEMENT: SHX5790

## 2022-10-31 HISTORY — DX: Paroxysmal atrial fibrillation: I48.0

## 2022-10-31 LAB — GLUCOSE, CAPILLARY
Glucose-Capillary: 120 mg/dL — ABNORMAL HIGH (ref 70–99)
Glucose-Capillary: 109 mg/dL — ABNORMAL HIGH (ref 70–99)

## 2022-10-31 SURGERY — CYSTOSCOPY, WITH STENT INSERTION
Anesthesia: General | Laterality: Left

## 2022-10-31 MED ORDER — LACTATED RINGERS IV SOLN: INTRAVENOUS | Status: DC

## 2022-10-31 MED ORDER — SODIUM CHLORIDE 0.9 % IR SOLN
Status: DC | PRN
  Administered 2022-10-31: 3000 mL via INTRAVESICAL

## 2022-10-31 MED ORDER — FENTANYL CITRATE (PF) 100 MCG/2ML IJ SOLN
INTRAMUSCULAR | Status: AC
  Filled 2022-10-31: qty 2

## 2022-10-31 MED ORDER — DEXAMETHASONE SODIUM PHOSPHATE 10 MG/ML IJ SOLN
INTRAMUSCULAR | Status: AC
  Filled 2022-10-31: qty 1

## 2022-10-31 MED ORDER — ONDANSETRON HCL 4 MG/2ML IJ SOLN
INTRAMUSCULAR | Status: AC
  Filled 2022-10-31: qty 2

## 2022-10-31 MED ORDER — IOHEXOL 300 MG/ML  SOLN
INTRAMUSCULAR | Status: DC | PRN
  Administered 2022-10-31: 10 mL

## 2022-10-31 MED ORDER — PHENYLEPHRINE 80 MCG/ML (10ML) SYRINGE FOR IV PUSH (FOR BLOOD PRESSURE SUPPORT)
PREFILLED_SYRINGE | INTRAVENOUS | Status: DC | PRN
  Administered 2022-10-31 (×3): 160 ug via INTRAVENOUS

## 2022-10-31 MED ORDER — FENTANYL CITRATE PF 50 MCG/ML IJ SOSY: 25.0000 ug | PREFILLED_SYRINGE | INTRAMUSCULAR | Status: DC | PRN

## 2022-10-31 MED ORDER — ACETAMINOPHEN 10 MG/ML IV SOLN: 1000.0000 mg | Freq: Once | INTRAVENOUS | Status: DC | PRN

## 2022-10-31 MED ORDER — PROPOFOL 10 MG/ML IV BOLUS
INTRAVENOUS | Status: DC | PRN
  Administered 2022-10-31: 30 mg via INTRAVENOUS
  Administered 2022-10-31: 170 mg via INTRAVENOUS

## 2022-10-31 MED ORDER — FENTANYL CITRATE PF 50 MCG/ML IJ SOSY
PREFILLED_SYRINGE | INTRAMUSCULAR | Status: AC
  Administered 2022-10-31: 50 ug via INTRAVENOUS
  Filled 2022-10-31: qty 1

## 2022-10-31 MED ORDER — PROPOFOL 10 MG/ML IV BOLUS
INTRAVENOUS | Status: AC
  Filled 2022-10-31: qty 20

## 2022-10-31 MED ORDER — LIDOCAINE 2% (20 MG/ML) 5 ML SYRINGE
INTRAMUSCULAR | Status: DC | PRN
  Administered 2022-10-31: 100 mg via INTRAVENOUS

## 2022-10-31 MED ORDER — FENTANYL CITRATE (PF) 100 MCG/2ML IJ SOLN
INTRAMUSCULAR | Status: DC | PRN
  Administered 2022-10-31: 50 ug via INTRAVENOUS

## 2022-10-31 MED ORDER — FENTANYL CITRATE PF 50 MCG/ML IJ SOSY: 50.0000 ug | PREFILLED_SYRINGE | Freq: Once | INTRAMUSCULAR | Status: AC

## 2022-10-31 MED ORDER — CEFAZOLIN SODIUM-DEXTROSE 2-4 GM/100ML-% IV SOLN
2.0000 g | INTRAVENOUS | Status: AC
  Administered 2022-10-31: 2 g via INTRAVENOUS
  Filled 2022-10-31: qty 100

## 2022-10-31 MED ORDER — ONDANSETRON HCL 4 MG/2ML IJ SOLN
INTRAMUSCULAR | Status: DC | PRN
  Administered 2022-10-31: 4 mg via INTRAVENOUS

## 2022-10-31 MED ORDER — ACETAMINOPHEN 500 MG PO TABS: 1000.0000 mg | ORAL_TABLET | Freq: Once | ORAL | Status: DC | PRN

## 2022-10-31 MED ORDER — LIDOCAINE HCL (PF) 2 % IJ SOLN
INTRAMUSCULAR | Status: AC
  Filled 2022-10-31: qty 5

## 2022-10-31 MED ORDER — CHLORHEXIDINE GLUCONATE 0.12 % MT SOLN
15.0000 mL | Freq: Once | OROMUCOSAL | Status: AC
  Administered 2022-10-31: 15 mL via OROMUCOSAL

## 2022-10-31 MED ORDER — ACETAMINOPHEN 160 MG/5ML PO SOLN: 1000.0000 mg | Freq: Once | ORAL | Status: DC | PRN

## 2022-10-31 MED ORDER — HYDROCODONE-ACETAMINOPHEN 5-325 MG PO TABS: 1.0000 | ORAL_TABLET | ORAL | 0 refills | Status: DC | PRN

## 2022-10-31 MED ORDER — DEXAMETHASONE SODIUM PHOSPHATE 10 MG/ML IJ SOLN
INTRAMUSCULAR | Status: DC | PRN
  Administered 2022-10-31: 10 mg via INTRAVENOUS

## 2022-10-31 MED ORDER — MIDAZOLAM HCL 5 MG/5ML IJ SOLN
INTRAMUSCULAR | Status: DC | PRN
  Administered 2022-10-31: 2 mg via INTRAVENOUS

## 2022-10-31 MED ORDER — ORAL CARE MOUTH RINSE: 15.0000 mL | Freq: Once | OROMUCOSAL | Status: AC

## 2022-10-31 MED ORDER — MIDAZOLAM HCL 2 MG/2ML IJ SOLN
INTRAMUSCULAR | Status: AC
  Filled 2022-10-31: qty 2

## 2022-10-31 SURGICAL SUPPLY — 14 items
BAG URO CATCHER STRL LF (MISCELLANEOUS) ×1 IMPLANT
BULB IRRIG PATHFIND (MISCELLANEOUS) IMPLANT
CATH URETL OPEN END 6FR 70 (CATHETERS) ×1 IMPLANT
CLOTH BEACON ORANGE TIMEOUT ST (SAFETY) ×1 IMPLANT
GLOVE SURG LX STRL 7.5 STRW (GLOVE) ×1 IMPLANT
GOWN STRL REUS W/ TWL XL LVL3 (GOWN DISPOSABLE) ×1 IMPLANT
GOWN STRL REUS W/TWL XL LVL3 (GOWN DISPOSABLE) ×1
GUIDEWIRE STR DUAL SENSOR (WIRE) ×1 IMPLANT
MANIFOLD NEPTUNE II (INSTRUMENTS) ×1 IMPLANT
PACK CYSTO (CUSTOM PROCEDURE TRAY) ×1 IMPLANT
STENT URET 6FRX26 CONTOUR (STENTS) IMPLANT
SYR 20ML LL LF (SYRINGE) ×1 IMPLANT
TUBING CONNECTING 10 (TUBING) ×1 IMPLANT
TUBING UROLOGY SET (TUBING) IMPLANT

## 2022-10-31 NOTE — H&P (View-Only) (Signed)
12 lead EKG obtained as patient is here for same day surgery. Patient in A-FIB. Reviewed with anesthesia - Dr. Ambrose Pancoast. No new orders at this time.

## 2022-10-31 NOTE — Anesthesia Procedure Notes (Signed)
Procedure Name: LMA Insertion Date/Time: 10/31/2022 3:35 PM  Performed by: Sharlette Dense, CRNAPatient Re-evaluated:Patient Re-evaluated prior to induction Oxygen Delivery Method: Circle system utilized Preoxygenation: Pre-oxygenation with 100% oxygen Induction Type: IV induction LMA: LMA inserted LMA Size: 4.0 Number of attempts: 1 Placement Confirmation: positive ETCO2 and breath sounds checked- equal and bilateral Tube secured with: Tape Dental Injury: Teeth and Oropharynx as per pre-operative assessment

## 2022-10-31 NOTE — Transfer of Care (Signed)
Immediate Anesthesia Transfer of Care Note  Patient: Zachary Reid  Procedure(s) Performed: CYSTOSCOPY WITH STENT PLACEMENT (Left)  Patient Location: PACU  Anesthesia Type:General  Level of Consciousness: awake and alert   Airway & Oxygen Therapy: Patient Spontanous Breathing and Patient connected to face mask oxygen  Post-op Assessment: Report given to RN and Post -op Vital signs reviewed and stable  Post vital signs: Reviewed and stable  Last Vitals:  Vitals Value Taken Time  BP 103/77 10/31/22 1607  Temp    Pulse 84 10/31/22 1608  Resp 16 10/31/22 1608  SpO2 100 % 10/31/22 1608  Vitals shown include unvalidated device data.  Last Pain:  Vitals:   10/31/22 1334  TempSrc: Oral  PainSc: 5          Complications: No notable events documented.

## 2022-10-31 NOTE — H&P (Signed)
Patient is a 66 year old white male seen today for evaluation of acute onset left-sided flank pain with known history of left renal calculus. Patient has history of metastatic pancreatic cancer. Has had frequent CT scans with last one being in September 2023. This showed a 8 to 9 mm nonobstructing left renal calculus at that time. Patient over the last few days developed some left inguinal pain with some dysuria. He self treated with amoxicillin at home and seem to get somewhat better but has had some worsening left-sided flank pain with nausea and vomiting earlier this morning. Denies any fever. He states because of chemotherapy his white blood cell count is down and he is immunosuppressed. Pain level is 4 out of 10 but has been as high as 10 out of 10 earlier today.  Micro urinalysis today showed 3-6 RBCs and 0-5 WBCs.  KUB is reviewed today shows no obvious bony abnormalities. This was compared to previous CT scan CT scout image where a calcification was seen overlying the left renal shadow. There is no calcification overlying the left renal shadow but there is now a 8 mm calcification in the region of the left distal ureter overlying the bony pelvis. This was not present on prior CT scout image suggesting migration of the stone distally.     ALLERGIES: None   MEDICATIONS: Alprazolam 0.25 mg tablet 1 tablet PO Daily  Humalog Kwikpen U-100 100 unit/ml insulin pen 1 PO Daily  Tramadol Hcl 50 mg tablet 1 tablet PO Daily  Tresiba Flextouch U-100 100 unit/ml (3 ml) insulin pen 1 PO Daily  Zolpidem Tartrate 10 mg tablet 1 tablet PO Daily     GU PSH: None   NON-GU PSH: Appendectomy Remove Rib     GU PMH: None   NON-GU PMH: Diabetes Type 2 Malignant neoplasm of pancreas, unspecified    FAMILY HISTORY: Diabetes - Father pancreatic cancer - Mother   SOCIAL HISTORY: Marital Status: Married Ethnicity: Not Hispanic Or Latino; Race: White Current Smoking Status: Patient has never smoked.    Tobacco Use Assessment Completed: Used Tobacco in last 30 days? Does not use smokeless tobacco. Has never drank.  Does not use drugs. Drinks 1 caffeinated drink per day.    REVIEW OF SYSTEMS:    GU Review Male:   Patient reports get up at night to urinate. Patient denies frequent urination, hard to postpone urination, burning/ pain with urination, leakage of urine, stream starts and stops, trouble starting your stream, have to strain to urinate , erection problems, and penile pain.  Gastrointestinal (Upper):   Patient reports nausea and vomiting. Patient denies indigestion/ heartburn.  Gastrointestinal (Lower):   Patient denies diarrhea and constipation.  Constitutional:   Patient reports fatigue. Patient denies fever, night sweats, and weight loss.  Skin:   Patient denies skin rash/ lesion and itching.  Eyes:   Patient denies blurred vision and double vision.  Ears/ Nose/ Throat:   Patient denies sore throat and sinus problems.  Hematologic/Lymphatic:   Patient denies swollen glands and easy bruising.  Cardiovascular:   Patient denies leg swelling and chest pains.  Respiratory:   Patient denies cough and shortness of breath.  Endocrine:   Patient denies excessive thirst.  Musculoskeletal:   Patient reports back pain. Patient denies joint pain.  Neurological:   Patient denies headaches and dizziness.  Psychologic:   Patient denies depression and anxiety.   Notes: weak stream; left side back / flank pain    VITAL SIGNS:  10/31/2022 09:31 AM  Weight 18 lb / 8.16 kg  Height 71 in / 180.34 cm  BP 131/78 mmHg  Pulse 72 /min  Temperature 97.5 F / 36.3 C  BMI 2.5 kg/m   GU PHYSICAL EXAMINATION:    Anus and Perineum: No hemorrhoids. No anal stenosis. No rectal fissure, no anal fissure. No edema, no dimple, no perineal tenderness, no anal tenderness.  Scrotum: No lesions. No edema. No cysts. No warts.  Epididymides: Right: no spermatocele, no masses, no cysts, no tenderness, no  induration, no enlargement. Left: no spermatocele, no masses, no cysts, no tenderness, no induration, no enlargement.  Testes: No tenderness, no swelling, no enlargement left testes. No tenderness, no swelling, no enlargement right testes. Normal location left testes. Normal location right testes. No mass, no cyst, no varicocele, no hydrocele left testes. No mass, no cyst, no varicocele, no hydrocele right testes.  Urethral Meatus: Normal size. No lesion, no wart, no discharge, no polyp. Normal location.  Penis: Circumcised, no warts, no cracks. No dorsal Peyronie's plaques, no left corporal Peyronie's plaques, no right corporal Peyronie's plaques, no scarring, no warts. No balanitis, no meatal stenosis.  Prostate: 40 gram or 2+ size. Left lobe normal consistency, right lobe normal consistency. Symmetrical lobes. No prostate nodule. Left lobe no tenderness, right lobe no tenderness.  Seminal Vesicles: Nonpalpable.  Sphincter Tone: Normal sphincter. No rectal tenderness. No rectal mass.    MULTI-SYSTEM PHYSICAL EXAMINATION:    Constitutional: Well-nourished. No physical deformities. Normally developed. Good grooming.  Neck: Neck symmetrical, not swollen. Normal tracheal position.  Respiratory: No labored breathing, no use of accessory muscles.   Cardiovascular: Normal temperature, normal extremity pulses, no swelling, no varicosities.  Lymphatic: No enlargement of neck, axillae, groin.  Skin: No paleness, no jaundice, no cyanosis. No lesion, no ulcer, no rash.  Neurologic / Psychiatric: Oriented to time, oriented to place, oriented to person. No depression, no anxiety, no agitation.  Gastrointestinal: No mass, no tenderness, no rigidity, non obese abdomen.  Eyes: Normal conjunctivae. Normal eyelids.  Ears, Nose, Mouth, and Throat: Left ear no scars, no lesions, no masses. Right ear no scars, no lesions, no masses. Nose no scars, no lesions, no masses. Normal hearing. Normal lips.  Musculoskeletal:  Normal gait and station of head and neck.     Complexity of Data:  Source Of History:  Patient  Records Review:   Previous Doctor Records, Previous Hospital Records, Previous Patient Records  Urine Test Review:   Urinalysis   PROCEDURES:         KUB - 84696  A single view of the abdomen is obtained.      Patient confirmed No Neulasta OnPro Device. KUB is reviewed today shows no obvious bony abnormalities. This was compared to previous CT scan CT scout image where a calcification was seen overlying the left renal shadow. There is no calcification overlying the left renal shadow but there is now a 8 mm calcification in the region of the left distal ureter overlying the bony pelvis. This was not present on prior CT scout image suggesting migration of the stone distally.          Urinalysis w/Scope - 81001 Dipstick Dipstick Cont'd Micro  Specimen: Voided Bilirubin: Neg WBC/hpf: 0 - 5/hpf  Color: Yellow Ketones: Trace RBC/hpf: 3 - 10/hpf  Appearance: Clear Blood: 2+ Bacteria: Few (10-25/hpf)  Specific Gravity: 1.015 Protein: Trace Cystals: NS (Not Seen)  pH: 5.5 Urobilinogen: 0.2 Casts: NS (Not Seen)  Glucose: Trace Nitrites: Neg  Trichomonas: Not Present    Leukocyte Esterase: Neg Mucous: Present      Epithelial Cells: NS (Not Seen)      Yeast: NS (Not Seen)      Sperm: Not Present    ASSESSMENT:      ICD-10 Details  1 GU:   Flank Pain - H73.42 Acute, Complicated Injury  2   Ureteral calculus - A76.8 Acute, Complicated Injury   PLAN:           Orders X-Rays: KUB          Schedule         Document Letter(s):  Created for Patient: Clinical Summary         Notes:   I discussed the KUB findings suggesting distal migration of his left renal calculus. He is having significant pain and I recommended because he is immunocompromised and having significant pain we proceed with cystoscopy insertion of left JJ stent today. Will then talk about options of management which would include  ureteroscopy and laser lithotripsy after 7 to 10 days versus in situ ESL. Patient agreeable to proceed with stent.

## 2022-10-31 NOTE — Op Note (Signed)
Preoperative diagnosis:  1.  Left ureteral calculus  Postoperative diagnosis: 1.  Left ureteral calculus  Procedure(s): 1.  Cystoscopy, left retrograde pyelogram with intraoperative interpretation, insertion left JJ stent  Surgeon: Dr. Harold Barban  Anesthesia: General  Complications: None  EBL: Minimal  Specimens: None  Disposition of specimens: Not applicable  Intraoperative findings: Filling defect left mid ureter consistent with stone approximately 7 mm in size.  Able to place 6 [place] by 26 cm Percuflex plus soft contour stent without difficulty.  No tether.  Hydronephrotic flow of mildly cloudy urine.  Indication: 66 year old white male presented with acute onset left-sided flank pain.  Had known left renal calculus which appears to have migrated to the left mid to distal ureter based on KUB.  Presents at this time undergo cystoscopy insertion left JJ stent in preparation for definitive management.  Description of procedure:  After obtaining informed consent the patient was taken the major cystoscopy suite placed under general anesthesia.  Placed in dorsolithotomy position genitalia prepped draped in usual sterile fashion.  Proper pause timeout was performed.  21 Fransico was Durene Fruits to the bladder without difficulty pendulous prostatic urethra appeared grossly normal with mild bilobar prostatic hypertrophy.  The bladder appeared grossly normal without mucosal lesions.  The left ureteral orifice was identified and was cannulated over a guidewire with a 5 French open tip catheter.  Retrograde pyelogram cystoscopy performed showed probable filling defect in the left mid ureter with some tortuosity and bird beaking to this area.  I subsequently passed a sensor wire through the open tip catheter was able to manipulate this by this area and into the left renal pelvis.  The operative catheter was removed.  6 [place] by 26 cm Percuflex plus soft contour stent was placed leaving a proximal  coil in renal pelvis and distal coil in the bladder.  There was flow of mildly cloudy urine through and around the stent noted.  Bladder was emptied procedure terminated.  He was awakened from anesthesia and taken back to the cover room in stable condition.  No immediate complication from the procedure.  Patient will likely need definitive left ureteroscopy and laser lithotripsy in 7 to 10 days after passive dilation of the ureter is achieved with the stent.  It should be noted that at time of induction the patient was noted to be in atrial fibrillation with rate around 100.  His vital signs were stable and he went in and out of atrial fibrillation during the case.  This will need to be evaluated outpatient assuming he is hemodynamic stable in recovery room.

## 2022-10-31 NOTE — Anesthesia Preprocedure Evaluation (Addendum)
Anesthesia Evaluation  Patient identified by MRN, date of birth, ID band Patient awake    Reviewed: Allergy & Precautions, NPO status , Patient's Chart, lab work & pertinent test results  History of Anesthesia Complications Negative for: history of anesthetic complications  Airway Mallampati: II  TM Distance: >3 FB Neck ROM: Full    Dental  (+) Teeth Intact, Dental Advisory Given,    Pulmonary neg pulmonary ROS   Pulmonary exam normal        Cardiovascular hypertension, Pt. on medications Normal cardiovascular exam     Neuro/Psych negative neurological ROS     GI/Hepatic negative GI ROS, Neg liver ROS,,,Pancreatic mass   Endo/Other  diabetes, Type 2, Oral Hypoglycemic Agents    Renal/GU CRFRenal disease  negative genitourinary   Musculoskeletal negative musculoskeletal ROS (+)    Abdominal   Peds  Hematology negative hematology ROS (+)   Anesthesia Other Findings Pancreatic adenocarcinoma  Port-A-Cath in place Pulmonary nodules    Reproductive/Obstetrics                             Anesthesia Physical Anesthesia Plan  ASA: 3  Anesthesia Plan: General   Post-op Pain Management:    Induction: Intravenous  PONV Risk Score and Plan: 2 and Treatment may vary due to age or medical condition, Ondansetron and Dexamethasone  Airway Management Planned: Oral ETT and LMA  Additional Equipment: None  Intra-op Plan:   Post-operative Plan: Extubation in OR  Informed Consent: I have reviewed the patients History and Physical, chart, labs and discussed the procedure including the risks, benefits and alternatives for the proposed anesthesia with the patient or authorized representative who has indicated his/her understanding and acceptance.       Plan Discussed with: CRNA and Anesthesiologist  Anesthesia Plan Comments: ( EKG shows Afib.  This appears new onset.  Rate is controlled  and pt is asymptomatic.)        Anesthesia Quick Evaluation

## 2022-10-31 NOTE — Progress Notes (Signed)
12 lead EKG obtained as patient is here for same day surgery. Patient in A-FIB. Reviewed with anesthesia - Dr. Ambrose Pancoast. No new orders at this time.

## 2022-10-31 NOTE — Interval H&P Note (Signed)
History and Physical Interval Note:  10/31/2022 3:14 PM  Zachary Reid  has presented today for surgery, with the diagnosis of left ureteral stone.  The various methods of treatment have been discussed with the patient and family. After consideration of risks, benefits and other options for treatment, the patient has consented to  Procedure(s): [place] (Left) as a surgical intervention.  The patient's history has been reviewed, patient examined, no change in status, stable for surgery.  I have reviewed the patient's chart and labs.  Questions were answered to the patient's satisfaction.     [name]

## 2022-11-01 ENCOUNTER — Encounter: Payer: Self-pay | Admitting: Hematology

## 2022-11-01 ENCOUNTER — Encounter (HOSPITAL_COMMUNITY): Payer: Self-pay | Admitting: Urology

## 2022-11-01 NOTE — Anesthesia Postprocedure Evaluation (Signed)
Anesthesia Post Note  Patient: Zachary Reid  Procedure(s) Performed: CYSTOSCOPY WITH STENT PLACEMENT (Left)     Patient location during evaluation: Endoscopy Anesthesia Type: General Level of consciousness: awake and alert Pain management: pain level controlled Vital Signs Assessment: post-procedure vital signs reviewed and stable Respiratory status: spontaneous breathing, nonlabored ventilation and respiratory function stable Cardiovascular status: stable and blood pressure returned to baseline Postop Assessment: no apparent nausea or vomiting Anesthetic complications: no  No notable events documented.  Last Vitals:  Vitals:   10/31/22 1645 10/31/22 1706  BP: 105/65 128/75  Pulse: 83 80  Resp: 17   Temp: 36.4 C   SpO2: 97% 100%    Last Pain:  Vitals:   10/31/22 1645  TempSrc:   PainSc: 0-No pain                 [name]

## 2022-11-04 ENCOUNTER — Telehealth: Payer: Self-pay

## 2022-11-04 DIAGNOSIS — I4891 Unspecified atrial fibrillation: Secondary | ICD-10-CM

## 2022-11-04 DIAGNOSIS — C259 Malignant neoplasm of pancreas, unspecified: Secondary | ICD-10-CM

## 2022-11-04 NOTE — Telephone Encounter (Signed)
Pt stated he had renal stent placed at which time it was discovered that the pt has AFIB.  Pt was not placed on any medication for his AFIB and was not referred to Cardiology.  Pt was told to contact Dr. Ernestina Penna office to see if she could refer pt to Cardiology.  Notified Dr. Burr Medico of pt's newly developed AFIB.

## 2022-11-07 ENCOUNTER — Ambulatory Visit: Payer: Medicare Other | Attending: Cardiology | Admitting: Cardiology

## 2022-11-07 ENCOUNTER — Encounter: Payer: Self-pay | Admitting: Cardiology

## 2022-11-07 ENCOUNTER — Ambulatory Visit: Payer: Medicare Other | Attending: Cardiology

## 2022-11-07 VITALS — BP 138/72 | HR 78 | Ht 71.0 in | Wt 185.0 lb

## 2022-11-07 DIAGNOSIS — Z79899 Other long term (current) drug therapy: Secondary | ICD-10-CM | POA: Insufficient documentation

## 2022-11-07 DIAGNOSIS — E119 Type 2 diabetes mellitus without complications: Secondary | ICD-10-CM | POA: Insufficient documentation

## 2022-11-07 DIAGNOSIS — I480 Paroxysmal atrial fibrillation: Secondary | ICD-10-CM | POA: Diagnosis not present

## 2022-11-07 LAB — CBC
Hematocrit: 32.3 % — ABNORMAL LOW (ref 37.5–51.0)
Hemoglobin: 11.2 g/dL — ABNORMAL LOW (ref 13.0–17.7)
MCH: 34.9 pg — ABNORMAL HIGH (ref 26.6–33.0)
MCHC: 34.7 g/dL (ref 31.5–35.7)
MCV: 101 fL — ABNORMAL HIGH (ref 79–97)
Platelets: 127 10*3/uL — ABNORMAL LOW (ref 150–450)
RBC: 3.21 x10E6/uL — ABNORMAL LOW (ref 4.14–5.80)
RDW: 13.2 % (ref 11.6–15.4)
WBC: 3.3 10*3/uL — ABNORMAL LOW (ref 3.4–10.8)

## 2022-11-07 MED ORDER — METOPROLOL SUCCINATE ER 25 MG PO TB24: 12.5000 mg | ORAL_TABLET | Freq: Every day | ORAL | 3 refills | Status: DC

## 2022-11-07 MED ORDER — RIVAROXABAN 20 MG PO TABS: 20.0000 mg | ORAL_TABLET | Freq: Every day | ORAL | 3 refills | Status: DC

## 2022-11-07 NOTE — Progress Notes (Unsigned)
Enrolled for [employer] to mail a ZIO XT long term holter monitor to the patients address on file.  

## 2022-11-07 NOTE — Patient Instructions (Signed)
Medication Instructions:  Your physician has recommended you make the following change in your medication:  START: Xarelto '20mg'$  daily  START: Toprol XL 12.'5mg'$  daily.  *If you need a refill on your cardiac medications before your next appointment, please call your pharmacy*   Lab Work: Your physician recommends that you have the following lab drawn today: CBC  If you have labs (blood work) drawn today and your tests are completely normal, you will receive your results only by: [place] (if you have MyChart) OR A paper copy in the mail If you have any lab test that is abnormal or we need to change your treatment, we will call you to review the results.   Testing/Procedures: Your physician has requested that you have an echocardiogram. Echocardiography is a painless test that uses sound waves to create images of your heart. It provides your doctor with information about the size and shape of your heart and how well your heart's chambers and valves are working. This procedure takes approximately one hour. There are no restrictions for this procedure. Please do NOT wear cologne, perfume, aftershave, or lotions (deodorant is allowed). Please arrive 15 minutes prior to your appointment time.  ZIO XT- Long Term Monitor Instructions  Your physician has requested you wear a ZIO patch monitor for 14 days.  This is a single patch monitor. [employer] supplies one patch monitor per enrollment. Additional stickers are not available. Please do not apply patch if you will be having a Nuclear Stress Test,  Echocardiogram, Cardiac CT, MRI, or Chest Xray during the period you would be wearing the  monitor. The patch cannot be worn during these tests. You cannot remove and re-apply the  ZIO XT patch monitor.  Your ZIO patch monitor will be mailed 3 day USPS to your address on file. It may take 3-5 days  to receive your monitor after you have been enrolled.  Once you have received your monitor, please  review the enclosed instructions. Your monitor  has already been registered assigning a specific monitor serial # to you.  Billing and Patient Assistance Program Information  We have supplied [employer] with any of your insurance information on file for billing purposes. [employer] offers a sliding scale Patient Assistance Program for patients that do not have  insurance, or whose insurance does not completely cover the cost of the ZIO monitor.  You must apply for the Patient Assistance Program to qualify for this discounted rate.  To apply, please call [employer] at [phone], select option 4, select option 2, ask to apply for  Patient Assistance Program. Theodore Demark will ask your household income, and how many people  are in your household. They will quote your out-of-pocket cost based on that information.  [employer] will also be able to set up a 67-month interest-free payment plan if needed.  Applying the monitor   Shave hair from upper left chest.  Hold abrader disc by orange tab. Rub abrader in 40 strokes over the upper left chest as  indicated in your monitor instructions.  Clean area with 4 enclosed alcohol pads. Let dry.  Apply patch as indicated in monitor instructions. Patch will be placed under collarbone on left  side of chest with arrow pointing upward.  Rub patch adhesive wings for 2 minutes. Remove white label marked "1". Remove the white  label marked "2". Rub patch adhesive wings for 2 additional minutes.  While looking in a mirror, press and release button in center of patch. A small green light  will  flash 3-4 times. This will be your only indicator that the monitor has been turned on.  Do not shower for the first 24 hours. You may shower after the first 24 hours.  Press the button if you feel a symptom. You will hear a small click. Record Date, Time and  Symptom in the Patient Logbook.  When you are ready to remove the patch, follow instructions on the last 2 pages of Patient   Logbook. Stick patch monitor onto the last page of Patient Logbook.  Place Patient Logbook in the blue and white box. Use locking tab on box and tape box closed  securely. The blue and white box has prepaid postage on it. Please place it in the mailbox as  soon as possible. Your physician should have your test results approximately 7 days after the  monitor has been mailed back to [facility].  Call [place] at [phone] if you have questions regarding  your ZIO XT patch monitor. Call them immediately if you see an orange light blinking on your  monitor.  If your monitor falls off in less than 4 days, contact our Monitor department at [phone].  If your monitor becomes loose or falls off after 4 days call [employer] at [phone] for  suggestions on securing your monitor     Follow-Up: At [facility], you and your health needs are our priority.  As part of our continuing mission to provide you with exceptional heart care, we have created designated Provider Care Teams.  These Care Teams include your primary Cardiologist (physician) and Advanced Practice Providers (APPs -  Physician Assistants and Nurse Practitioners) who all work together to provide you with the care you need, when you need it.  We recommend signing up for the patient portal called "MyChart".  Sign up information is provided on this After Visit Summary.  MyChart is used to connect with patients for Virtual Visits (Telemedicine).  Patients are able to view lab/test results, encounter notes, upcoming appointments, etc.  Non-urgent messages can be sent to your provider as well.   To learn more about what you can do with MyChart, go to [email or web address].    Your next appointment:   16 week(s)  The format for your next appointment:   In Person  Provider:   [name], DO

## 2022-11-09 NOTE — Progress Notes (Signed)
Cardiology Office Note:    Date:  11/09/2022   ID:  Zachary Reid, DOB [birth year], MRN [id number]  PCP:  [name], MD  Cardiologist:  [name], DO  Electrophysiologist:  None   Referring MD: [name], MD   " I was recently diagnosed with atrial fibrillation"   History of Present Illness:    Zachary Reid is a 66 y.o. male with a hx of recently diagnosed atrial fibrillation which was found while he was undergoing a procedure.  Medical history includes diabetes mellitus, hyperlipidemia, vitamin D deficiency and IBS.  He also had a procedure when he was noted to be in atrial fibrillation rapid ventricular rate.  He was asked to see cardiology.  He denies any chest pain or shortness of breath.  Past Medical History:  Diagnosis Date   Abnormal EKG    DM (diabetes mellitus) ([place])    Hyperlipidemia    Hypogonadism male    IBS (irritable bowel syndrome)    Obesity    BMI 31   pancreatic ca 06/28/2021   Vitamin D deficiency     Past Surgical History:  Procedure Laterality Date   APPENDECTOMY     BIOPSY  07/26/2021   Procedure: BIOPSY;  Surgeon: Irving Copas., MD;  Location: [facility];  Service: Gastroenterology;;   COLONOSCOPY WITH PROPOFOL N/A 07/26/2021   Procedure: COLONOSCOPY WITH PROPOFOL;  Surgeon: Irving Copas., MD;  Location: [facility];  Service: Gastroenterology;  Laterality: N/A;   CYSTOSCOPY WITH STENT PLACEMENT Left 10/31/2022   Procedure: CYSTOSCOPY WITH STENT PLACEMENT;  Surgeon: [name], MD;  Location: [facility];  Service: Urology;  Laterality: Left;   ESOPHAGOGASTRODUODENOSCOPY (EGD) WITH PROPOFOL N/A 07/26/2021   Procedure: ESOPHAGOGASTRODUODENOSCOPY (EGD) WITH PROPOFOL;  Surgeon: Rush Landmark Telford Nab., MD;  Location: [facility];  Service: Gastroenterology;  Laterality: N/A;   EUS N/A 07/26/2021   Procedure: UPPER ENDOSCOPIC ULTRASOUND (EUS) RADIAL;  Surgeon: Irving Copas., MD;  Location: [facility];   Service: Gastroenterology;  Laterality: N/A;   FINE NEEDLE ASPIRATION  07/26/2021   Procedure: FINE NEEDLE ASPIRATION (FNA) LINEAR;  Surgeon: Irving Copas., MD;  Location: [facility];  Service: Gastroenterology;;   IR IMAGING GUIDED PORT INSERTION  08/10/2021   POLYPECTOMY  07/26/2021   Procedure: POLYPECTOMY;  Surgeon: Irving Copas., MD;  Location: [facility];  Service: Gastroenterology;;   SHOULDER SURGERY Right     Current Medications: Current Meds  Medication Sig   ALPRAZolam (XANAX) 0.25 MG tablet Take 1-2 tablets (0.25-0.5 mg total) by mouth 2 (two) times daily as needed for anxiety.   Cholecalciferol (VITAMIN D3) 125 MCG (5000 UT) CAPS Take 5,000 Units by mouth in the morning and at bedtime.   gabapentin (NEURONTIN) 600 MG tablet Take 1 tablet (600 mg total) by mouth 3 (three) times daily. (Patient taking differently: Take 600 mg by mouth 3 (three) times daily as needed (pain).)   glucose blood test strip Use as instructed   HYDROcodone-acetaminophen (NORCO/VICODIN) 5-325 MG tablet Take 1 tablet by mouth every 4 (four) hours as needed for moderate pain.   hyoscyamine (LEVSIN SL) 0.125 MG SL tablet DISSOLVE ONE TABLET UNDER THE TONGUE 4 TIMES DAILY UP TO EVERY 4 HOURS AS NEEDED FOR NAUSEA, BLOATING, CRAMPING, OR DIARRHEA (Patient taking differently: Place 0.125 mg under the tongue every 6 (six) hours as needed for cramping.)   insulin degludec (TRESIBA FLEXTOUCH) 100 UNIT/ML FlexTouch Pen Inject 30 Units into the skin daily.   insulin lispro (HUMALOG KWIKPEN) 100 UNIT/ML  KwikPen 10 TO 15 UNITS BEFORE MEALS AS DIRECTED (Patient taking differently: Inject 10-12 Units into the skin 3 (three) times daily before meals.)   metoprolol succinate (TOPROL XL) 25 MG 24 hr tablet Take 0.5 tablets (12.5 mg total) by mouth daily.   oxyCODONE (ROXICODONE) 5 MG immediate release tablet Take 1 tablet (5 mg total) by mouth every 6 (six) hours as needed for up to 30 doses for severe pain.    rivaroxaban (XARELTO) 20 MG TABS tablet Take 1 tablet (20 mg total) by mouth daily with supper.   traMADol (ULTRAM) 50 MG tablet Take 1-2 tablets (50-100 mg total) by mouth every 6 (six) hours as needed. (Patient taking differently: Take 50-100 mg by mouth every 6 (six) hours as needed for moderate pain.)   zolpidem (AMBIEN) 10 MG tablet Take 1 tablet (10 mg total) by mouth at bedtime as needed for sleep.     Allergies:   Lipitor [atorvastatin]   Social History   Socioeconomic History   Marital status: Married    Spouse name: Not on file   Number of children: 1   Years of education: Not on file   Highest education level: Not on file  Occupational History   Not on file  Tobacco Use   Smoking status: Never   Smokeless tobacco: Never  Substance and Sexual Activity   Alcohol use: No   Drug use: No   Sexual activity: Never  Other Topics Concern   Not on file  Social History Narrative   Not on file   Social Determinants of Health   Financial Resource Strain: Not on file  Food Insecurity: Not on file  Transportation Needs: Not on file  Physical Activity: Not on file  Stress: Not on file  Social Connections: Not on file     Family History: The patient's family history includes Diabetes in his father; Drug abuse in his brother; Hypertension in his father; Kidney Stones in his brother; Lung cancer in his paternal grandfather; Ovarian cancer (age of onset: 34) in his paternal grandmother; Pancreatic cancer (age of onset: 72) in his mother; Prostate cancer (age of onset: 9) in his father. There is no history of Colon cancer, Colon polyps, Esophageal cancer, Stomach cancer, or Rectal cancer.  ROS:   Review of Systems  Constitution: Negative for decreased appetite, fever and weight gain.  HENT: Negative for congestion, ear discharge, hoarse voice and sore throat.   Eyes: Negative for discharge, redness, vision loss in right eye and visual halos.  Cardiovascular: Negative for  chest pain, dyspnea on exertion, leg swelling, orthopnea and palpitations.  Respiratory: Negative for cough, hemoptysis, shortness of breath and snoring.   Endocrine: Negative for heat intolerance and polyphagia.  Hematologic/Lymphatic: Negative for bleeding problem. Does not bruise/bleed easily.  Skin: Negative for flushing, nail changes, rash and suspicious lesions.  Musculoskeletal: Negative for arthritis, joint pain, muscle cramps, myalgias, neck pain and stiffness.  Gastrointestinal: Negative for abdominal pain, bowel incontinence, diarrhea and excessive appetite.  Genitourinary: Negative for decreased libido, genital sores and incomplete emptying.  Neurological: Negative for brief paralysis, focal weakness, headaches and loss of balance.  Psychiatric/Behavioral: Negative for altered mental status, depression and suicidal ideas.  Allergic/Immunologic: Negative for HIV exposure and persistent infections.    EKGs/Labs/Other Studies Reviewed:    The following studies were reviewed today:   EKG:  The ekg ordered today demonstrates   Recent Labs: 10/17/2022: ALT 23; BUN 17; Creatinine 1.12; Potassium 3.7; Sodium 142 11/07/2022: Hemoglobin 11.2; Platelets  127  Recent Lipid Panel    Component Value Date/Time   CHOL 171 07/16/2022 0917   TRIG 129.0 07/16/2022 0917   HDL 54.00 07/16/2022 0917   CHOLHDL 3 07/16/2022 0917   VLDL 25.8 07/16/2022 0917   LDLCALC 91 07/16/2022 0917   LDLCALC 92 06/05/2021 1130    Physical Exam:    VS:  BP 138/72   Pulse 78   Ht '5\' 11"'$  (1.803 m)   Wt 185 lb (83.9 kg)   SpO2 95%   BMI 25.80 kg/m     Wt Readings from Last 3 Encounters:  11/07/22 185 lb (83.9 kg)  10/31/22 180 lb (81.6 kg)  10/17/22 182 lb 11.2 oz (82.9 kg)     GEN: Well nourished, well developed in no acute distress HEENT: Normal NECK: No JVD; No carotid bruits LYMPHATICS: No lymphadenopathy CARDIAC: S1S2 noted,RRR, no murmurs, rubs, gallops RESPIRATORY:  Clear to  auscultation without rales, wheezing or rhonchi  ABDOMEN: Soft, non-tender, non-distended, +bowel sounds, no guarding. EXTREMITIES: No edema, No cyanosis, no clubbing MUSCULOSKELETAL:  No deformity  SKIN: Warm and dry NEUROLOGIC:  Alert and oriented x 3, non-focal PSYCHIATRIC:  Normal affect, good insight  ASSESSMENT:    1. PAF (paroxysmal atrial fibrillation) ([place])   2. Medication management   3. Type 2 diabetes mellitus without complication, without long-term current use of insulin (H[facility])    PLAN:     Paroxysmal atrial fibrillation he is in sinus rhythm today with PACs.  He is asymptomatic when he is in A-fib so it is really hard to tell if he is experiencing episodes.  What I like to do is place to monitor the patient to understand how much of A-fib episodes he is experiencing.  I reviewed that EKG during his procedure and I agree he was in A-fib.  Thankfully he is in sinus rhythm-I think he would benefit from low-dose beta-blocker I will start Toprol XL.  Stroke prevention in this patient is important his chads Vascor is 2.  Will start Xarelto 20 mg daily today.  Will get an echocardiogram to assess LV function.  Will also place a monitor the patient to understand the episodes of A-fib.  DM- managed by his pcp  In the meantime he can proceed with his noncardiac surgery if needed.  The patient is in agreement with the above plan. The patient left the office in stable condition.  The patient will follow up in 12 weeks or sooner if needed.   Medication Adjustments/Labs and Tests Ordered: Current medicines are reviewed at length with the patient today.  Concerns regarding medicines are outlined above.  Orders Placed This Encounter  Procedures   CBC   LONG TERM MONITOR (3-14 DAYS)   EKG 12-Lead   ECHOCARDIOGRAM COMPLETE   Meds ordered this encounter  Medications   rivaroxaban (XARELTO) 20 MG TABS tablet    Sig: Take 1 tablet (20 mg total) by mouth daily with supper.     Dispense:  90 tablet    Refill:  3   metoprolol succinate (TOPROL XL) 25 MG 24 hr tablet    Sig: Take 0.5 tablets (12.5 mg total) by mouth daily.    Dispense:  45 tablet    Refill:  3    Patient Instructions  Medication Instructions:  Your physician has recommended you make the following change in your medication:  START: Xarelto '20mg'$  daily  START: Toprol XL 12.'5mg'$  daily.  *If you need a refill on your cardiac medications before your  next appointment, please call your pharmacy*   Lab Work: Your physician recommends that you have the following lab drawn today: CBC  If you have labs (blood work) drawn today and your tests are completely normal, you will receive your results only by: [place] (if you have MyChart) OR A paper copy in the mail If you have any lab test that is abnormal or we need to change your treatment, we will call you to review the results.   Testing/Procedures: Your physician has requested that you have an echocardiogram. Echocardiography is a painless test that uses sound waves to create images of your heart. It provides your doctor with information about the size and shape of your heart and how well your heart's chambers and valves are working. This procedure takes approximately one hour. There are no restrictions for this procedure. Please do NOT wear cologne, perfume, aftershave, or lotions (deodorant is allowed). Please arrive 15 minutes prior to your appointment time.  ZIO XT- Long Term Monitor Instructions  Your physician has requested you wear a ZIO patch monitor for 14 days.  This is a single patch monitor. [employer] supplies one patch monitor per enrollment. Additional stickers are not available. Please do not apply patch if you will be having a Nuclear Stress Test,  Echocardiogram, Cardiac CT, MRI, or Chest Xray during the period you would be wearing the  monitor. The patch cannot be worn during these tests. You cannot remove and re-apply the  ZIO  XT patch monitor.  Your ZIO patch monitor will be mailed 3 day USPS to your address on file. It may take 3-5 days  to receive your monitor after you have been enrolled.  Once you have received your monitor, please review the enclosed instructions. Your monitor  has already been registered assigning a specific monitor serial # to you.  Billing and Patient Assistance Program Information  We have supplied [employer] with any of your insurance information on file for billing purposes. [employer] offers a sliding scale Patient Assistance Program for patients that do not have  insurance, or whose insurance does not completely cover the cost of the ZIO monitor.  You must apply for the Patient Assistance Program to qualify for this discounted rate.  To apply, please call [employer] at [phone], select option 4, select option 2, ask to apply for  Patient Assistance Program. Theodore Demark will ask your household income, and how many people  are in your household. They will quote your out-of-pocket cost based on that information.  [employer] will also be able to set up a 64-month interest-free payment plan if needed.  Applying the monitor   Shave hair from upper left chest.  Hold abrader disc by orange tab. Rub abrader in 40 strokes over the upper left chest as  indicated in your monitor instructions.  Clean area with 4 enclosed alcohol pads. Let dry.  Apply patch as indicated in monitor instructions. Patch will be placed under collarbone on left  side of chest with arrow pointing upward.  Rub patch adhesive wings for 2 minutes. Remove white label marked "1". Remove the white  label marked "2". Rub patch adhesive wings for 2 additional minutes.  While looking in a mirror, press and release button in center of patch. A small green light will  flash 3-4 times. This will be your only indicator that the monitor has been turned on.  Do not shower for the first 24 hours. You may shower after the first 24 hours.   Press  the button if you feel a symptom. You will hear a small click. Record Date, Time and  Symptom in the Patient Logbook.  When you are ready to remove the patch, follow instructions on the last 2 pages of Patient  Logbook. Stick patch monitor onto the last page of Patient Logbook.  Place Patient Logbook in the blue and white box. Use locking tab on box and tape box closed  securely. The blue and white box has prepaid postage on it. Please place it in the mailbox as  soon as possible. Your physician should have your test results approximately 7 days after the  monitor has been mailed back to [facility].  Call [place] at [phone] if you have questions regarding  your ZIO XT patch monitor. Call them immediately if you see an orange light blinking on your  monitor.  If your monitor falls off in less than 4 days, contact our Monitor department at [phone].  If your monitor becomes loose or falls off after 4 days call [employer] at [phone] for  suggestions on securing your monitor     Follow-Up: At [facility], you and your health needs are our priority.  As part of our continuing mission to provide you with exceptional heart care, we have created designated Provider Care Teams.  These Care Teams include your primary Cardiologist (physician) and Advanced Practice Providers (APPs -  Physician Assistants and Nurse Practitioners) who all work together to provide you with the care you need, when you need it.  We recommend signing up for the patient portal called "MyChart".  Sign up information is provided on this After Visit Summary.  MyChart is used to connect with patients for Virtual Visits (Telemedicine).  Patients are able to view lab/test results, encounter notes, upcoming appointments, etc.  Non-urgent messages can be sent to your provider as well.   To learn more about what you can do with MyChart, go to [email or web address].    Your next  appointment:   16 week(s)  The format for your next appointment:   In Person  Provider:   [name], DO     Adopting a Healthy Lifestyle.  Know what a healthy weight is for you (roughly BMI <25) and aim to maintain this   Aim for 7+ servings of fruits and vegetables daily   65-80+ fluid ounces of water or unsweet tea for healthy kidneys   Limit to max 1 drink of alcohol per day; avoid smoking/tobacco   Limit animal fats in diet for cholesterol and heart health - choose grass fed whenever available   Avoid highly processed foods, and foods high in saturated/trans fats   Aim for low stress - take time to unwind and care for your mental health   Aim for 150 min of moderate intensity exercise weekly for heart health, and weights twice weekly for bone health   Aim for 7-9 hours of sleep daily   When it comes to diets, agreement about the perfect plan isnt easy to find, even among the experts. Experts at the [employer] developed an idea known as the Healthy Eating Plate. Just imagine a plate divided into logical, healthy portions.   The emphasis is on diet quality:   Load up on vegetables and fruits - one-half of your plate: Aim for color and variety, and remember that potatoes dont count.   Go for whole grains - one-quarter of your plate: Whole wheat, barley, wheat berries, quinoa, oats,  brown rice, and foods made with them. If you want pasta, go with whole wheat pasta.   Protein power - one-quarter of your plate: Fish, chicken, beans, and nuts are all healthy, versatile protein sources. Limit red meat.   The diet, however, does go beyond the plate, offering a few other suggestions.   Use healthy plant oils, such as olive, canola, soy, corn, sunflower and peanut. Check the labels, and avoid partially hydrogenated oil, which have unhealthy trans fats.   If youre thirsty, drink water. Coffee and tea are good in moderation, but skip sugary drinks and  limit milk and dairy products to one or two daily servings.   The type of carbohydrate in the diet is more important than the amount. Some sources of carbohydrates, such as vegetables, fruits, whole grains, and beans-are healthier than others.   Finally, stay active  Signed, [name], DO  11/09/2022 1:10 PM    [facility]

## 2022-11-11 NOTE — Progress Notes (Signed)
[name]   [phone] Fax:([phone]) [phone]   Clinic Follow up Note   Patient Care Team: [name], MD as PCP - General (Internal Medicine) [name], NP as PCP - Hematology/Oncology (Nurse Practitioner) [name], DO as PCP - Cardiology (Cardiology) [name], MD as Consulting Physician (Orthopedic Surgery) [name], MD as Consulting Physician (Cardiology) [name], MD as Consulting Physician (Oncology) 11/13/2022  CHIEF COMPLAINT: Follow up metastatic pancreatic cancer   SUMMARY OF ONCOLOGIC HISTORY: Oncology History Overview Note   Cancer Staging  Pancreatic adenocarcinoma [facility]) Staging form: Exocrine Pancreas, AJCC 8th Edition - Clinical stage from 07/26/2021: Stage IV (cT4, cN0, cM1) - Signed by [name], MD on 07/28/2021    Pancreatic adenocarcinoma ([place])  07/02/2021 Initial Diagnosis   Pancreatic adenocarcinoma ([place])   07/26/2021 Cancer Staging   Staging form: Exocrine Pancreas, AJCC 8th Edition - Clinical stage from 07/26/2021: Stage IV (cT4, cN0, cM1) - Signed by [name], MD on 07/28/2021 Stage prefix: Initial diagnosis Total positive nodes: 0   08/13/2021 - 07/06/2022 Chemotherapy   Patient is on Treatment Plan : PANCREAS Modified FOLFIRINOX q14d x 4 cycles      Genetic Testing   [facility] CancerNext-Expanded results (77 genes) were negative. No pathogenic variants were identified. 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 The report date is 08/15/2021.    The CancerNext-Expanded gene panel offered by [facility] and includes sequencing, rearrangement, and RNA analysis for the following 77 genes: AIP, ALK, APC, ATM, AXIN2, BAP1, BARD1, BLM, BMPR1A, BRCA1, BRCA2, BRIP1, CDC73, CDH1, CDK4, CDKN1B, CDKN2A, CHEK2, CTNNA1, DICER1, FANCC, FH, FLCN, GALNT12, KIF1B, LZTR1, MAX, MEN1, MET, MLH1, MSH2, MSH3, MSH6, MUTYH, NBN, NF1, NF2, NTHL1, PALB2, PHOX2B, PMS2, POT1, PRKAR1A, PTCH1, PTEN,  RAD51C, RAD51D, RB1, RECQL, RET, SDHA, SDHAF2, SDHB, SDHC, SDHD, SMAD4, SMARCA4, SMARCB1, SMARCE1, STK11, SUFU, TMEM127, TP53, TSC1, TSC2, VHL and XRCC2 (sequencing and deletion/duplication); EGFR, EGLN1, HOXB13, KIT, MITF, PDGFRA, POLD1, and POLE (sequencing only); EPCAM and GREM1 (deletion/duplication only).     08/13/2021 -  Chemotherapy   Patient is on Treatment Plan : PANCREAS Modified FOLFIRINOX q14d x 4 cycles     10/25/2021 Imaging   EXAM: CT CHEST, ABDOMEN, AND PELVIS WITH CONTRAST  IMPRESSION: 1. Innumerable bilateral pulmonary nodules, many of which are cavitary, consistent with metastatic disease. These nodules show no substantial change in are minimally progressed in the interval. 2. Mix cystic and solid lesion in the head and body of the pancreas is similar to prior and also comparing back to MRI 07/02/2021. 3. Hepatic cysts. 4. 8 mm nonobstructing left renal stone. 5. Aortic Atherosclerosis (ICD10-I70.0).   01/28/2022 Imaging   EXAM: CT CHEST, ABDOMEN, AND PELVIS WITH CONTRAST  IMPRESSION: 1. Innumerable bilateral small solid and cavitary pulmonary nodules, some of the cavitary nodules are slightly less thick-walled when compared with prior exam. 2. Mixed cystic and solid lesion in the head and body of the pancreas is similar to prior exam. 3. Nonobstructing left renal stone. 4.  Aortic Atherosclerosis (ICD10-I70.0).     CURRENT THERAPY:  First line FOLFIRINOX, starting 08/13/21, now q3weeks              -irinotecan switched to liposomal 09/26/22  INTERVAL HISTORY: Zachary Reid returns for follow up and treatment as scheduled. Last seen by Dr. Burr Medico 10/17/22 and received cycle 3 liposomal irinotecan/5FU. He tolerates 50 mg/m2 much better. Dex was reduced due to poor sleep but he feels this contributed to  n/v lasting longer. Has occasional diarrhea. He takes pepto-bismol for this and general GI upset which helps. He recovered after 2 weeks. Feeling well today.  He  developed acute left flank pain, work up showed renal calculus s/p stenting and he was also found to be in Afib with RVR. He saw cardiology Dr. Sherren Mocha who placed him on a monitor to assess frequency of Afib episodes, and prescribed toprol XL and Xarelto which he has not started yet. He is still seeing bloody urine with intermittent urgency/frequency. No dysuria. Left flank pain resolved and actually R flank pain which he had since cancer diagnosis and gotten "90% better."   Otherwise doing well. Denies fever, chills, cough, chest pain, dyspnea, leg edema, mucositis, neuropathy, or any other new specific complaints.       REVIEW OF SYSTEMS:   All other systems were reviewed with the patient and are negative.  MEDICAL HISTORY:  Past Medical History:  Diagnosis Date   Abnormal EKG    DM (diabetes mellitus) ([place])    Hyperlipidemia    Hypogonadism male    IBS (irritable bowel syndrome)    Obesity    BMI 31   pancreatic ca 06/28/2021   Vitamin D deficiency     SURGICAL HISTORY: Past Surgical History:  Procedure Laterality Date   APPENDECTOMY     BIOPSY  07/26/2021   Procedure: BIOPSY;  Surgeon: Irving Copas., MD;  Location: [place];  Service: Gastroenterology;;   COLONOSCOPY WITH PROPOFOL N/A 07/26/2021   Procedure: COLONOSCOPY WITH PROPOFOL;  Surgeon: Irving Copas., MD;  Location: [place];  Service: Gastroenterology;  Laterality: N/A;   CYSTOSCOPY WITH STENT PLACEMENT Left 10/31/2022   Procedure: CYSTOSCOPY WITH STENT PLACEMENT;  Surgeon: [name], MD;  Location: [facility];  Service: Urology;  Laterality: Left;   ESOPHAGOGASTRODUODENOSCOPY (EGD) WITH PROPOFOL N/A 07/26/2021   Procedure: ESOPHAGOGASTRODUODENOSCOPY (EGD) WITH PROPOFOL;  Surgeon: Rush Landmark Telford Nab., MD;  Location: [place];  Service: Gastroenterology;  Laterality: N/A;   EUS N/A 07/26/2021   Procedure: UPPER ENDOSCOPIC ULTRASOUND (EUS) RADIAL;  Surgeon: Irving Copas., MD;   Location: [place];  Service: Gastroenterology;  Laterality: N/A;   FINE NEEDLE ASPIRATION  07/26/2021   Procedure: FINE NEEDLE ASPIRATION (FNA) LINEAR;  Surgeon: Rush Landmark Telford Nab., MD;  Location: [place];  Service: Gastroenterology;;   IR IMAGING GUIDED PORT INSERTION  08/10/2021   POLYPECTOMY  07/26/2021   Procedure: POLYPECTOMY;  Surgeon: Rush Landmark Telford Nab., MD;  Location: [place];  Service: Gastroenterology;;   SHOULDER SURGERY Right     I have reviewed the social history and family history with the patient and they are unchanged from previous note.  ALLERGIES:  is allergic to lipitor [atorvastatin].  MEDICATIONS:  Current Outpatient Medications  Medication Sig Dispense Refill   ALPRAZolam (XANAX) 0.25 MG tablet Take 1-2 tablets (0.25-0.5 mg total) by mouth 2 (two) times daily as needed for anxiety. 60 tablet 0   Cholecalciferol (VITAMIN D3) 125 MCG (5000 UT) CAPS Take 5,000 Units by mouth in the morning and at bedtime.     gabapentin (NEURONTIN) 600 MG tablet Take 1 tablet (600 mg total) by mouth 3 (three) times daily. (Patient taking differently: Take 600 mg by mouth 3 (three) times daily as needed (pain).) 90 tablet 1   glucose blood test strip Use as instructed 100 each 12   HYDROcodone-acetaminophen (NORCO/VICODIN) 5-325 MG tablet Take 1 tablet by mouth every 4 (four) hours as needed for moderate pain. 20 tablet 0  hyoscyamine (LEVSIN SL) 0.125 MG SL tablet DISSOLVE ONE TABLET UNDER THE TONGUE 4 TIMES DAILY UP TO EVERY 4 HOURS AS NEEDED FOR NAUSEA, BLOATING, CRAMPING, OR DIARRHEA (Patient taking differently: Place 0.125 mg under the tongue every 6 (six) hours as needed for cramping.) 100 tablet 0   insulin degludec (TRESIBA FLEXTOUCH) 100 UNIT/ML FlexTouch Pen Inject 30 Units into the skin daily.     insulin lispro (HUMALOG KWIKPEN) 100 UNIT/ML KwikPen 10 TO 15 UNITS BEFORE MEALS AS DIRECTED (Patient taking differently: Inject 10-12 Units into the skin 3 (three)  times daily before meals.) 15 mL 1   metoprolol succinate (TOPROL XL) 25 MG 24 hr tablet Take 0.5 tablets (12.5 mg total) by mouth daily. 45 tablet 3   oxyCODONE (ROXICODONE) 5 MG immediate release tablet Take 1 tablet (5 mg total) by mouth every 6 (six) hours as needed for up to 30 doses for severe pain. 30 tablet 0   rivaroxaban (XARELTO) 20 MG TABS tablet Take 1 tablet (20 mg total) by mouth daily with supper. 90 tablet 3   traMADol (ULTRAM) 50 MG tablet Take 1-2 tablets (50-100 mg total) by mouth every 6 (six) hours as needed. 120 tablet 0   zolpidem (AMBIEN) 10 MG tablet Take 1 tablet (10 mg total) by mouth at bedtime as needed for sleep. 30 tablet 0   No current facility-administered medications for this visit.   Facility-Administered Medications Ordered in Other Visits  Medication Dose Route Frequency Provider Last Rate Last Admin   dexamethasone (DECADRON) 10 mg in sodium chloride 0.9 % 50 mL IVPB  10 mg Intravenous Once [name], NP       fluorouracil (ADRUCIL) 3,650 mg in sodium chloride 0.9 % 77 mL chemo infusion  1,800 mg/m2 (Treatment Plan Recorded) Intravenous 1 day or 1 dose [name], MD       fosaprepitant (EMEND) 150 mg in sodium chloride 0.9 % 145 mL IVPB  150 mg Intravenous Once [name], MD       irinotecan LIPOSOME (ONIVYDE) 103.2 mg in sodium chloride 0.9 % 500 mL chemo infusion  50 mg/m2 (Treatment Plan Recorded) Intravenous Once [name], MD       leucovorin 812 mg in dextrose 5 % 250 mL infusion  400 mg/m2 (Treatment Plan Recorded) Intravenous Once [name], MD       oxaliplatin (ELOXATIN) 60 mg in dextrose 5 % 500 mL chemo infusion  30 mg/m2 (Treatment Plan Recorded) Intravenous Once [name], MD       palonosetron (ALOXI) injection 0.25 mg  0.25 mg Intravenous Once [name], MD       prochlorperazine (COMPAZINE) injection 10 mg  10 mg Intravenous Once [name], MD        PHYSICAL EXAMINATION: ECOG PERFORMANCE STATUS: 1 - Symptomatic but completely  ambulatory  Vitals:   11/13/22 0922  BP: 137/79  Pulse: 85  Resp: 16  Temp: 98.7 F (37.1 C)  SpO2: 98%   Filed Weights   11/13/22 0922  Weight: 184 lb 6 oz (83.6 kg)    GENERAL:alert, no distress and comfortable SKIN: no rash  EYES: sclera clear LUNGS: clear with normal breathing effort HEART: regular rate & rhythm, no lower extremity edema ABDOMEN:abdomen soft, non-tender and normal bowel sounds NEURO: alert & oriented x 3 with fluent speech, no focal motor/sensory deficits PAC without erythema   LABORATORY DATA:  I have reviewed the data as listed    Latest Ref Rng & Units 11/13/2022  9:03 AM 11/07/2022    4:49 PM 10/17/2022   10:18 AM  CBC  WBC 4.0 - 10.5 K/uL 6.6  3.3  4.1   Hemoglobin 13.0 - 17.0 g/dL 11.0  11.2  12.8   Hematocrit 39.0 - 52.0 % 30.9  32.3  35.6   Platelets 150 - 400 K/uL 96  127  114         Latest Ref Rng & Units 11/13/2022    9:03 AM 10/17/2022   10:18 AM 09/26/2022    9:57 AM  CMP  Glucose 70 - 99 mg/dL 247  198  174   BUN 8 - 23 mg/dL _0 Creatinine 0.61 - 1.24 mg/dL 0.97  1.12  0.94   Sodium 135 - 145 mmol/L 139  142  141   Potassium 3.5 - 5.1 mmol/L 3.5  3.7  4.3   Chloride 98 - 111 mmol/L 106  107  110   CO2 22 - 32 mmol/L _1 Calcium 8.9 - 10.3 mg/dL 9.0  9.1  9.0   Total Protein 6.5 - 8.1 g/dL 6.6  7.0  6.6   Total Bilirubin 0.3 - 1.2 mg/dL 0.4  0.8  0.6   Alkaline Phos 38 - 126 U/L 92  95  85   AST 15 - 41 U/L _2 ALT 0 - 44 U/L _3 RADIOGRAPHIC STUDIES: I have personally reviewed the radiological images as listed and agreed with the findings in the report. No results found.   ASSESSMENT & PLAN: Zachary Reid is a 66 y.o. male with    1. Metastatic pancreatic adenocarcinoma, [id number] with numerous small cavitary lesions in bilateral lungs -found incidentally on recent CT scan for left kidney stone.  He does have some nonspecific symptoms, including epigastric pain, chronic  right flank pain, and a dry cough.  He has had significant weight loss in the past several years. -Diagnosed 07/2021, Cytology confirmed adenocarcinoma.  Baseline CA 19-9 elevated to 273 on 07/03/2021 -IHC molecular testing was normal. He is not a candidate for immunotherapy. -He began first-line mFOLFIRINOX on 08/13/2021, required dose reduction and moved to q3 weeks -Restaging CT CAP 08/12/22 showed stable new and enlarging pulmonary metastases, stable pancreatic mass -He switched irinotecan to liposomal on 09/05/2022 and continues every 3 weeks -Mr. Delillo appears stable.  Nausea/vomiting lasted longer patient attributes to lower Dex dose, will increase back to 10 mg.  He otherwise tolerated well with manageable side effects.  He is able to recover and function well at home.  There is no clinical evidence of disease progression -Labs reviewed, proceed with FOLFIRINOX with liposomal irinotecan at same dose today as planned -He is being scheduled for restaging scan after this cycle -Follow-up in 3 weeks   2. Left kidney stone, right flank pain -He has had left kidney stone in the past -After last cycle developed acute left flank pain, workup showed renal calculus.  He underwent cystoscopy and stenting by Dr. Milford Cage on 12/14 -Left flank pain mostly resolved.  -Pt will contact urology for continued hematuria since stenting -He had right flank pain since pancreatic cancer diagnosis, he is on gabapentin 600 mg 3 times a day and CBD cream  -Takes tramadol twice daily  -Right flank pain also improved after left kidney stenting  3. Afib with RVR -found during recent ER visit for kidney stone -Saw  cardiologist Dr. Domingo Pulse who recommends cardiac monitor and prescribed toprol XL and xarelto. He has not started yet -OK from oncology standpoint to start these meds. We reviewed bleeding risk  4. Anxiety, Insomnia -anxiety began 2022. He was started on lexapro on 04/19/21 and wellbutrin on 06/05/21 but  came off them in August due to constipation -reported new anxiety attacks since diagnosis. Rx Xanax on 07/12/21, helpful. -He saw Dr. Michail Sermon but did not wish to follow-up -he notes he still has insomnia. Rx Ambien, brand is more effective than generic   5. Genetic Testing -he reports pancreatic cancer in his mother, prostate cancer in his father, ovarian in PGM, and lung in PGF (smoker). -he proceeded with testing on 07/30/21. -Result shows a variant of uncertain significance in the CDKN1B gene -He is BRCA1/2 negative, not a candidate for PARP inhibitor   6. DM  -F/up Dr. Dwyane Dee -BG >200 today, will monitor on dex   PLAN: -ED work up and today's labs reviewed -Proceed with mFOLFIRINOX with liposomal irinotecan today at same dose -Increase dex to 10 mg -Pt to contact urology for hematuria after stenting, I will fax my note to Dr. Milford Cage -Cardiology work up per Dr. Charlyn Minerva. OK to start toprol; may want to hold xarelto until hematuria improves.  -CT CAP in 2 weeks -F/up and next cycle in 3 weeks (then pt prefers Thursday appts starting in Feb)   Orders Placed This Encounter  Procedures   CT CHEST ABDOMEN PELVIS W CONTRAST    Standing Status:   Future    Standing Expiration Date:   11/14/2023    Order Specific Question:   If indicated for the ordered procedure, I authorize the administration of contrast media per Radiology protocol    Answer:   Yes    Order Specific Question:   Does the patient have a contrast media/X-ray dye allergy?    Answer:   No    Order Specific Question:   Preferred imaging location?    Answer:   [facility]    Order Specific Question:   Is Oral Contrast requested for this exam?    Answer:   Yes, Per Radiology protocol   All questions were answered. The patient knows to call the clinic with any problems, questions or concerns. No barriers to learning was detected. I spent 20 minutes counseling the patient face to face. The total time spent in the  appointment was 30 minutes and more than 50% was on counseling and review of test results.     [name], NP 11/13/22

## 2022-11-12 MED FILL — Fosaprepitant Dimeglumine For IV Infusion 150 MG (Base Eq): INTRAVENOUS | Qty: 5 | Status: AC

## 2022-11-13 ENCOUNTER — Encounter: Payer: Self-pay | Admitting: Nurse Practitioner

## 2022-11-13 ENCOUNTER — Inpatient Hospital Stay: Payer: Medicare Other

## 2022-11-13 ENCOUNTER — Inpatient Hospital Stay (HOSPITAL_BASED_OUTPATIENT_CLINIC_OR_DEPARTMENT_OTHER): Payer: Medicare Other | Admitting: Nurse Practitioner

## 2022-11-13 ENCOUNTER — Other Ambulatory Visit: Payer: Self-pay

## 2022-11-13 DIAGNOSIS — Z95828 Presence of other vascular implants and grafts: Secondary | ICD-10-CM

## 2022-11-13 DIAGNOSIS — C259 Malignant neoplasm of pancreas, unspecified: Secondary | ICD-10-CM

## 2022-11-13 DIAGNOSIS — C250 Malignant neoplasm of head of pancreas: Secondary | ICD-10-CM | POA: Diagnosis not present

## 2022-11-13 DIAGNOSIS — E860 Dehydration: Secondary | ICD-10-CM

## 2022-11-13 LAB — CBC WITH DIFFERENTIAL (CANCER CENTER ONLY)
Abs Immature Granulocytes: 0.03 10*3/uL (ref 0.00–0.07)
Basophils Absolute: 0 10*3/uL (ref 0.0–0.1)
Basophils Relative: 1 %
Eosinophils Absolute: 0.1 10*3/uL (ref 0.0–0.5)
Eosinophils Relative: 2 %
HCT: 30.9 % — ABNORMAL LOW (ref 39.0–52.0)
Hemoglobin: 11 g/dL — ABNORMAL LOW (ref 13.0–17.0)
Immature Granulocytes: 1 %
Lymphocytes Relative: 14 %
Lymphs Abs: 0.9 10*3/uL (ref 0.7–4.0)
MCH: 34.5 pg — ABNORMAL HIGH (ref 26.0–34.0)
MCHC: 35.6 g/dL (ref 30.0–36.0)
MCV: 96.9 fL (ref 80.0–100.0)
Monocytes Absolute: 0.5 10*3/uL (ref 0.1–1.0)
Monocytes Relative: 8 %
Neutro Abs: 5 10*3/uL (ref 1.7–7.7)
Neutrophils Relative %: 74 %
Platelet Count: 96 10*3/uL — ABNORMAL LOW (ref 150–400)
RBC: 3.19 MIL/uL — ABNORMAL LOW (ref 4.22–5.81)
RDW: 13.1 % (ref 11.5–15.5)
WBC Count: 6.6 10*3/uL (ref 4.0–10.5)
nRBC: 0 % (ref 0.0–0.2)

## 2022-11-13 LAB — CMP (CANCER CENTER ONLY)
ALT: 21 U/L (ref 0–44)
AST: 21 U/L (ref 15–41)
Albumin: 3.8 g/dL (ref 3.5–5.0)
Alkaline Phosphatase: 92 U/L (ref 38–126)
Anion gap: 5 (ref 5–15)
BUN: 14 mg/dL (ref 8–23)
CO2: 28 mmol/L (ref 22–32)
Calcium: 9 mg/dL (ref 8.9–10.3)
Chloride: 106 mmol/L (ref 98–111)
Creatinine: 0.97 mg/dL (ref 0.61–1.24)
GFR, Estimated: >60 mL/min (ref >60)
Glucose, Bld: 247 mg/dL — ABNORMAL HIGH (ref 70–99)
Potassium: 3.5 mmol/L (ref 3.5–5.1)
Sodium: 139 mmol/L (ref 135–145)
Total Bilirubin: 0.4 mg/dL (ref 0.3–1.2)
Total Protein: 6.6 g/dL (ref 6.5–8.1)

## 2022-11-13 MED ORDER — TRAMADOL HCL 50 MG PO TABS: 50.0000 mg | ORAL_TABLET | Freq: Four times a day (QID) | ORAL | 0 refills | Status: DC | PRN

## 2022-11-13 MED ORDER — LEUCOVORIN CALCIUM INJECTION 350 MG
400.0000 mg/m2 | Freq: Once | INTRAVENOUS | Status: AC
  Administered 2022-11-13: 812 mg via INTRAVENOUS
  Filled 2022-11-13: qty 40.6

## 2022-11-13 MED ORDER — OXALIPLATIN CHEMO INJECTION 100 MG/20ML
30.0000 mg/m2 | Freq: Once | INTRAVENOUS | Status: AC
  Administered 2022-11-13: 60 mg via INTRAVENOUS
  Filled 2022-11-13: qty 12

## 2022-11-13 MED ORDER — ALPRAZOLAM 0.25 MG PO TABS: 0.2500 mg | ORAL_TABLET | Freq: Two times a day (BID) | ORAL | 0 refills | Status: DC | PRN

## 2022-11-13 MED ORDER — DEXTROSE 5 % IV SOLN: Freq: Once | INTRAVENOUS | Status: AC

## 2022-11-13 MED ORDER — SODIUM CHLORIDE 0.9 % IV SOLN
1800.0000 mg/m2 | INTRAVENOUS | Status: DC
  Administered 2022-11-13: 3650 mg via INTRAVENOUS
  Filled 2022-11-13: qty 73

## 2022-11-13 MED ORDER — PALONOSETRON HCL INJECTION 0.25 MG/5ML
0.2500 mg | Freq: Once | INTRAVENOUS | Status: AC
  Administered 2022-11-13: 0.25 mg via INTRAVENOUS
  Filled 2022-11-13: qty 5

## 2022-11-13 MED ORDER — SODIUM CHLORIDE 0.9 % IV SOLN
50.0000 mg/m2 | Freq: Once | INTRAVENOUS | Status: AC
  Administered 2022-11-13: 103.2 mg via INTRAVENOUS
  Filled 2022-11-13: qty 24

## 2022-11-13 MED ORDER — PROCHLORPERAZINE EDISYLATE 10 MG/2ML IJ SOLN
10.0000 mg | Freq: Once | INTRAMUSCULAR | Status: AC
  Administered 2022-11-13: 10 mg via INTRAVENOUS
  Filled 2022-11-13: qty 2

## 2022-11-13 MED ORDER — SODIUM CHLORIDE 0.9 % IV SOLN
150.0000 mg | Freq: Once | INTRAVENOUS | Status: AC
  Administered 2022-11-13: 150 mg via INTRAVENOUS
  Filled 2022-11-13: qty 150

## 2022-11-13 MED ORDER — SODIUM CHLORIDE 0.9% FLUSH
10.0000 mL | Freq: Once | INTRAVENOUS | Status: AC
  Administered 2022-11-13: 10 mL

## 2022-11-13 MED ORDER — SODIUM CHLORIDE 0.9 % IV SOLN
10.0000 mg | Freq: Once | INTRAVENOUS | Status: AC
  Administered 2022-11-13: 10 mg via INTRAVENOUS
  Filled 2022-11-13: qty 10

## 2022-11-13 MED ORDER — ZOLPIDEM TARTRATE 10 MG PO TABS: 10.0000 mg | ORAL_TABLET | Freq: Every evening | ORAL | 0 refills | Status: DC | PRN

## 2022-11-14 ENCOUNTER — Encounter: Payer: Self-pay | Admitting: Cardiology

## 2022-11-14 ENCOUNTER — Telehealth: Payer: Self-pay | Admitting: Nurse Practitioner

## 2022-11-14 NOTE — Telephone Encounter (Signed)
Left patient a voicemail regarding upcoming appointments  

## 2022-11-15 ENCOUNTER — Inpatient Hospital Stay: Payer: Medicare Other

## 2022-11-15 ENCOUNTER — Other Ambulatory Visit: Payer: Self-pay

## 2022-11-15 VITALS — BP 109/64 | HR 69 | Temp 98.2°F | Resp 18

## 2022-11-15 DIAGNOSIS — C250 Malignant neoplasm of head of pancreas: Secondary | ICD-10-CM | POA: Diagnosis not present

## 2022-11-15 DIAGNOSIS — C259 Malignant neoplasm of pancreas, unspecified: Secondary | ICD-10-CM

## 2022-11-15 LAB — CANCER ANTIGEN 19-9: CA 19-9: 235 U/mL — ABNORMAL HIGH (ref 0–35)

## 2022-11-15 MED ORDER — SODIUM CHLORIDE 0.9% FLUSH
10.0000 mL | INTRAVENOUS | Status: DC | PRN
  Administered 2022-11-15: 10 mL

## 2022-11-15 MED ORDER — HEPARIN SOD (PORK) LOCK FLUSH 100 UNIT/ML IV SOLN
500.0000 [IU] | Freq: Once | INTRAVENOUS | Status: AC | PRN
  Administered 2022-11-15: 500 [IU]

## 2022-11-19 ENCOUNTER — Ambulatory Visit (INDEPENDENT_AMBULATORY_CARE_PROVIDER_SITE_OTHER): Payer: Medicare Other | Admitting: Endocrinology

## 2022-11-19 ENCOUNTER — Encounter: Payer: Self-pay | Admitting: Endocrinology

## 2022-11-19 VITALS — BP 130/70 | HR 75 | Ht 71.0 in | Wt 178.8 lb

## 2022-11-19 DIAGNOSIS — Z794 Long term (current) use of insulin: Secondary | ICD-10-CM

## 2022-11-19 DIAGNOSIS — E1122 Type 2 diabetes mellitus with diabetic chronic kidney disease: Secondary | ICD-10-CM

## 2022-11-19 DIAGNOSIS — E119 Type 2 diabetes mellitus without complications: Secondary | ICD-10-CM

## 2022-11-19 LAB — POCT GLYCOSYLATED HEMOGLOBIN (HGB A1C): Hemoglobin A1C: 7.9 % — AB (ref 4.0–5.6)

## 2022-11-19 MED ORDER — DEXCOM G7 SENSOR MISC: 1.0000 | 3 refills | Status: DC

## 2022-11-19 NOTE — Patient Instructions (Addendum)
Check blood sugars on waking up   Also check blood sugars about 2 hours after meals and do this after different meals by rotation  Recommended blood sugar levels on waking up are 90-130 and about 2 hours after meal is 130-180  Please bring your blood sugar monitor to each visit, thank you  Keep adjusting Humalog to control after meal targets

## 2022-11-19 NOTE — Progress Notes (Signed)
Patient ID: Zachary Reid, male   DOB: Mar 27, 1956, 67 y.o.   MRN: [id number]           Reason for Appointment: Type II Diabetes follow-up   History of Present Illness   Diagnosis date: 2013  Previous history:  Non-insulin hypoglycemic drugs previously used: Glipizide, metformin, Ozempic Metformin was stopped in 4/23 when starting insulin Insulin was started in 2023  A1c range in the last few years is: 7.4-10% A1c has been higher since 2021  Recent history:     Non-insulin hypoglycemic drugs: None     Insulin regimen: 32 units of Tresiba in the morning    Humalog 12 units before meals    Side effects from medications: None  Current self management, blood sugar patterns and problems identified:  A1c is 7.9, previously 9.1  He was recommended the freestyle libre system but not clear why the order was not fulfilled Currently mostly monitoring fasting blood sugars and unable to review his meter because of lack of date and time He does have higher readings after eating sweets or getting dexamethasone for his chemotherapy infusions Told to take mealtime insulin in addition to [place] on the last visit and titrate the [place] He has gone up 2 units on his [place] on the last visit However since he does not routinely check his blood sugars after meals not clear what his blood sugars are with his current Humalog dose Also not taking any extra insulin for high sugars  Exercise: Inconsistent  Diet management: May not always restrict sweets     Hypoglycemia:  none recently Glucometer: [employer]       Blood Glucose readings from review of recent home record:   Fasting mostly about 140, nonfasting 200-400  Weight control:  Wt Readings from Last 3 Encounters:  11/19/22 178 lb 12.8 oz (81.1 kg)  11/13/22 184 lb 6 oz (83.6 kg)  11/07/22 185 lb (83.9 kg)            Diabetes labs:  Lab Results  Component Value Date   HGBA1C 7.9 (A) 11/19/2022   HGBA1C 9.1 (H)  07/16/2022   HGBA1C 9.6 (A) 03/13/2022   Lab Results  Component Value Date   MICROALBUR 1.6 07/16/2022   LDLCALC 91 07/16/2022   CREATININE 0.97 11/13/2022     Allergies as of 11/19/2022       Reactions   Lipitor [atorvastatin]    fatigue        Medication List        Accurate as of November 19, 2022  2:21 PM. If you have any questions, ask your nurse or doctor.          ALPRAZolam 0.25 MG tablet Commonly known as: XANAX Take 1-2 tablets (0.25-0.5 mg total) by mouth 2 (two) times daily as needed for anxiety.   Dexcom G7 Sensor Misc 1 Device by Does not apply route as directed. Change sensor every 10 days Started by: Elayne Snare, MD   gabapentin 600 MG tablet Commonly known as: NEURONTIN Take 1 tablet (600 mg total) by mouth 3 (three) times daily. What changed:  when to take this reasons to take this   glucose blood test strip Use as instructed   HYDROcodone-acetaminophen 5-325 MG tablet Commonly known as: NORCO/VICODIN Take 1 tablet by mouth every 4 (four) hours as needed for moderate pain.   hyoscyamine 0.125 MG SL tablet Commonly known as: LEVSIN SL DISSOLVE ONE TABLET UNDER THE TONGUE 4 TIMES DAILY UP TO EVERY 4  HOURS AS NEEDED FOR NAUSEA, BLOATING, CRAMPING, OR DIARRHEA What changed:  how much to take how to take this when to take this reasons to take this additional instructions   insulin lispro 100 UNIT/ML KwikPen Commonly known as: HumaLOG KwikPen 10 TO 15 UNITS BEFORE MEALS AS DIRECTED What changed:  how much to take how to take this when to take this additional instructions   metoprolol succinate 25 MG 24 hr tablet Commonly known as: Toprol XL Take 0.5 tablets (12.5 mg total) by mouth daily.   oxyCODONE 5 MG immediate release tablet Commonly known as: Roxicodone Take 1 tablet (5 mg total) by mouth every 6 (six) hours as needed for up to 30 doses for severe pain.   rivaroxaban 20 MG Tabs tablet Commonly known as: XARELTO Take 1  tablet (20 mg total) by mouth daily with supper.   traMADol 50 MG tablet Commonly known as: ULTRAM Take 1-2 tablets (50-100 mg total) by mouth every 6 (six) hours as needed.   Tyler Aas FlexTouch 100 UNIT/ML FlexTouch Pen Generic drug: insulin degludec Inject 30 Units into the skin daily.   Vitamin D3 125 MCG (5000 UT) Caps Take 5,000 Units by mouth in the morning and at bedtime.   zolpidem 10 MG tablet Commonly known as: AMBIEN Take 1 tablet (10 mg total) by mouth at bedtime as needed for sleep.        Allergies:  Allergies  Allergen Reactions   Lipitor [Atorvastatin]     fatigue    Past Medical History:  Diagnosis Date   Abnormal EKG    DM (diabetes mellitus) ([place])    Hyperlipidemia    Hypogonadism male    IBS (irritable bowel syndrome)    Obesity    BMI 31   pancreatic ca 06/28/2021   Vitamin D deficiency     Past Surgical History:  Procedure Laterality Date   APPENDECTOMY     BIOPSY  07/26/2021   Procedure: BIOPSY;  Surgeon: Irving Copas., MD;  Location: [facility];  Service: Gastroenterology;;   COLONOSCOPY WITH PROPOFOL N/A 07/26/2021   Procedure: COLONOSCOPY WITH PROPOFOL;  Surgeon: Irving Copas., MD;  Location: [facility];  Service: Gastroenterology;  Laterality: N/A;   CYSTOSCOPY WITH STENT PLACEMENT Left 10/31/2022   Procedure: CYSTOSCOPY WITH STENT PLACEMENT;  Surgeon: [name], MD;  Location: [facility];  Service: Urology;  Laterality: Left;   ESOPHAGOGASTRODUODENOSCOPY (EGD) WITH PROPOFOL N/A 07/26/2021   Procedure: ESOPHAGOGASTRODUODENOSCOPY (EGD) WITH PROPOFOL;  Surgeon: Rush Landmark Telford Nab., MD;  Location: [facility];  Service: Gastroenterology;  Laterality: N/A;   EUS N/A 07/26/2021   Procedure: UPPER ENDOSCOPIC ULTRASOUND (EUS) RADIAL;  Surgeon: Irving Copas., MD;  Location: [facility];  Service: Gastroenterology;  Laterality: N/A;   FINE NEEDLE ASPIRATION  07/26/2021   Procedure: FINE NEEDLE ASPIRATION (FNA)  LINEAR;  Surgeon: Irving Copas., MD;  Location: [facility];  Service: Gastroenterology;;   IR IMAGING GUIDED PORT INSERTION  08/10/2021   POLYPECTOMY  07/26/2021   Procedure: POLYPECTOMY;  Surgeon: Irving Copas., MD;  Location: [facility];  Service: Gastroenterology;;   SHOULDER SURGERY Right     Family History  Problem Relation Age of Onset   Pancreatic cancer Mother 59   Prostate cancer Father 24       Prostate   Diabetes Father    Hypertension Father    Drug abuse Brother    Kidney Stones Brother    Ovarian cancer Paternal Grandmother 55   Lung cancer Paternal Grandfather  Cigar smoker   Colon cancer Neg Hx    Colon polyps Neg Hx    Esophageal cancer Neg Hx    Stomach cancer Neg Hx    Rectal cancer Neg Hx     Social History:  reports that he has never smoked. He has never used smokeless tobacco. He reports that he does not drink alcohol and does not use drugs.  Review of Systems:  Last diabetic eye exam date 9/21  Last foot exam date: 2020  Symptoms of neuropathy: None  Hypertension: Not present  BP Readings from Last 3 Encounters:  11/19/22 130/70  11/15/22 109/64  11/13/22 137/79    Lipid management: Previously on simvastatin, stopped by PCP    Lab Results  Component Value Date   CHOL 171 07/16/2022   CHOL 157 06/05/2021   CHOL 195 03/05/2021   Lab Results  Component Value Date   HDL 54.00 07/16/2022   HDL 42 06/05/2021   HDL 40 03/05/2021   Lab Results  Component Value Date   LDLCALC 91 07/16/2022   LDLCALC 92 06/05/2021   LDLCALC 130 (H) 03/05/2021   Lab Results  Component Value Date   TRIG 129.0 07/16/2022   TRIG 133 06/05/2021   TRIG 132 03/05/2021   Lab Results  Component Value Date   CHOLHDL 3 07/16/2022   CHOLHDL 3.7 06/05/2021   CHOLHDL 4.9 03/05/2021   No results found for: "LDLDIRECT"   Examination:   BP 130/70 (BP Location: Left Arm, Patient Position: Sitting, Cuff Size: Normal)   Pulse 75    Ht '5\' 11"'$  (1.803 m)   Wt 178 lb 12.8 oz (81.1 kg)   SpO2 97%   BMI 24.94 kg/m   Body mass index is 24.94 kg/m.    ASSESSMENT/ PLAN:    Diabetes type 2 insulin requiring:   Current regimen: Basal bolus insulin  See history of present illness for detailed discussion of current diabetes management, blood sugar patterns and problems identified  A1c is 7.9 compared to 9.1 %  Blood glucose control is recently improving with basal bolus insulin regimen However with only fasting readings available and not clear how his blood sugars are after meals Despite reminders he is not adjusting his mealtime insulin and likely has consistent high readings after meals especially when he goes on to eating more sweets   Plan: Again reminded him of need to testing blood sugar at least once a day about 2 hours after one of his meals  Will try to get him either libre from [employer] or Dex[employer] based on his insurance coverage to both websites/pharmacies He will let us know when he receives his supplies and will treat him how to do this  Discussed adjustment of Humalog at least 2 to 6 units if blood sugars are running consistently high after meals especially when he is on steroids or eating more sweets   TRESIBA insulin: Continue same dose  Total visit time for evaluation and management and counseling = 30 minutes  Patient Instructions  Check blood sugars on waking up   Also check blood sugars about 2 hours after meals and do this after different meals by rotation  Recommended blood sugar levels on waking up are 90-130 and about 2 hours after meal is 130-180  Please bring your blood sugar monitor to each visit, thank you  Keep adjusting Humalog to control after meal targets    Elayne Snare 11/19/2022, 2:21 PM

## 2022-11-20 ENCOUNTER — Other Ambulatory Visit: Payer: Self-pay

## 2022-11-27 ENCOUNTER — Ambulatory Visit (HOSPITAL_COMMUNITY): Payer: Medicare Other | Attending: Internal Medicine

## 2022-11-27 DIAGNOSIS — I480 Paroxysmal atrial fibrillation: Secondary | ICD-10-CM

## 2022-11-27 LAB — ECHOCARDIOGRAM COMPLETE: S' Lateral: 2.95 cm

## 2022-11-28 ENCOUNTER — Other Ambulatory Visit: Payer: Self-pay | Admitting: Urology

## 2022-11-29 ENCOUNTER — Encounter (HOSPITAL_BASED_OUTPATIENT_CLINIC_OR_DEPARTMENT_OTHER): Payer: Self-pay | Admitting: Urology

## 2022-11-29 ENCOUNTER — Ambulatory Visit (HOSPITAL_COMMUNITY)
Admission: RE | Admit: 2022-11-29 | Discharge: 2022-11-29 | Disposition: A | Discharge status: Home / Self Care | Payer: Medicare Other | Source: Ambulatory Visit | Attending: Nurse Practitioner | Admitting: Nurse Practitioner

## 2022-11-29 DIAGNOSIS — C259 Malignant neoplasm of pancreas, unspecified: Secondary | ICD-10-CM | POA: Insufficient documentation

## 2022-11-29 MED ORDER — IOHEXOL 300 MG/ML  SOLN
100.0000 mL | Freq: Once | INTRAMUSCULAR | Status: AC | PRN
  Administered 2022-11-29: 100 mL via INTRAVENOUS

## 2022-11-29 NOTE — Progress Notes (Addendum)
Called and left message w/ Jeannene Patella, OR scheduler for Dr Milford Cage.  Pt is scheduled for surgery on 12-03-2022 by Dr Milford Cage for kidney stone.  Noted pt is on xarelto and requesting clearance or is pt to continue taking.  Received call back from [facility] ,  stated she spoke to pt and per pt he is not taking xarelto and per Dr Milford Cage office note pt not in AFib anymore and to stop taking prior to surgery and no clearance was requested. Called and spoke w/ anesthesia, Dr Gifford Shave MDA, via phone. Reviewed chart w/ him and informed him of the above and that I have not spoke to pt yet since he has not returned call.   Per Dr Gifford Shave pt will be okay to proceed without getting clearance but make sure pt is taking toprol as as prescribed.

## 2022-12-02 ENCOUNTER — Other Ambulatory Visit: Payer: Self-pay

## 2022-12-02 ENCOUNTER — Encounter (HOSPITAL_BASED_OUTPATIENT_CLINIC_OR_DEPARTMENT_OTHER): Payer: Self-pay | Admitting: Urology

## 2022-12-02 ENCOUNTER — Encounter: Payer: Self-pay | Admitting: Hematology

## 2022-12-02 NOTE — H&P (Signed)
Seems to have cordPatient is a 67 year old white male seen today for evaluation of acute onset left-sided flank pain with known history of left renal calculus. Patient has history of metastatic pancreatic cancer. Has had frequent CT scans with last one being in September 2023. This showed a 8 to 9 mm nonobstructing left renal calculus at that time. Patient over the last few days developed some left inguinal pain with some dysuria. He self treated with amoxicillin at home and seem to get somewhat better but has had some worsening left-sided flank pain with nausea and vomiting earlier this morning. Denies any fever. He states because of chemotherapy his white blood cell count is down and he is immunosuppressed. Pain level is 4 out of 10 but has been as high as 10 out of 10 earlier today.  Micro urinalysis today showed 3-6 RBCs and 0-5 WBCs.  KUB is reviewed today shows no obvious bony abnormalities. This was compared to previous CT scan CT scout image where a calcification was seen overlying the left renal shadow. There is no calcification overlying the left renal shadow but there is now a 8 mm calcification in the region of the left distal ureter overlying the bony pelvis. This was not present on prior CT scout image suggesting migration of the stone distally.  -11/28/22-patient with history of left mid ureteral calculus underwent urgent cystoscopy insertion of left JJ stent on 10/31/2022. Patient did experience new onset atrial fibrillation with rapid ventricular response at the time of his induction but was discharged home as he was rate controlled. Subsequently has seen cardiology placed him on some Toprol for rate management and also placed him on anticoagulant. The patient seen today for urgent work in with hematuria. Was seen yesterday with echocardiogram which showed reasonable good ejection fraction. Patient started his Xarelto yesterday elated with this. He is having minimal dysuria denies any fever. Has  passed a few small clots.  Micro urinalysis today showed many bacteria too numerous to count RBCs was sent for culture  Void residual was 22 cc.      ALLERGIES: None   MEDICATIONS: Metoprolol Succinate 25 mg tablet, extended release 24 hr  Alprazolam 0.25 mg tablet 1 tablet PO Daily  Humalog Kwikpen U-100 100 unit/ml insulin pen 1 PO Daily  Tramadol Hcl 50 mg tablet 1 tablet PO Daily  Tresiba Flextouch U-100 100 unit/ml (3 ml) insulin pen 1 PO Daily  Xarelto 20 mg tablet  Zolpidem Tartrate 10 mg tablet 1 tablet PO Daily     GU PSH: Cystoscopy Insert Stent, Left - 10/31/2022     NON-GU PSH: Appendectomy Remove Rib     GU PMH: Flank Pain - 10/31/2022 Ureteral calculus - 10/31/2022    NON-GU PMH: Diabetes Type 2 Malignant neoplasm of pancreas, unspecified    FAMILY HISTORY: Diabetes - Father pancreatic cancer - Mother   SOCIAL HISTORY: Marital Status: Married Ethnicity: Not Hispanic Or Latino; Race: White Current Smoking Status: Patient has never smoked.   Tobacco Use Assessment Completed: Used Tobacco in last 30 days? Does not use smokeless tobacco. Has never drank.  Does not use drugs. Drinks 1 caffeinated drink per day.    REVIEW OF SYSTEMS:    GU Review Male:   Patient reports frequent urination. Patient denies hard to postpone urination, burning/ pain with urination, get up at night to urinate, leakage of urine, stream starts and stops, trouble starting your stream, have to strain to urinate , erection problems, and penile pain.  Gastrointestinal (Upper):  Patient denies nausea, vomiting, and indigestion/ heartburn.  Gastrointestinal (Lower):   Patient denies diarrhea and constipation.  Constitutional:   Patient denies fever, night sweats, weight loss, and fatigue.  Skin:   Patient denies skin rash/ lesion and itching.  Eyes:   Patient denies blurred vision and double vision.  Ears/ Nose/ Throat:   Patient denies sore throat and sinus problems.   Hematologic/Lymphatic:   Patient denies swollen glands and easy bruising.  Cardiovascular:   Patient denies leg swelling and chest pains.  Respiratory:   Patient denies cough and shortness of breath.  Endocrine:   Patient denies excessive thirst.  Musculoskeletal:   Patient reports back pain. Patient denies joint pain.  Neurological:   Patient denies headaches and dizziness.  Psychologic:   Patient denies depression and anxiety.   Notes: frank hematuria with clots starting this morning    VITAL SIGNS: None   MULTI-SYSTEM PHYSICAL EXAMINATION:    Constitutional: Well-nourished. No physical deformities. Normally developed. Good grooming.  Neck: Neck symmetrical, not swollen. Normal tracheal position.  Respiratory: No labored breathing, no use of accessory muscles.   Cardiovascular: Normal temperature, normal extremity pulses, no swelling, no varicosities.  Lymphatic: No enlargement of neck, axillae, groin.  Skin: No paleness, no jaundice, no cyanosis. No lesion, no ulcer, no rash.  Neurologic / Psychiatric: Oriented to time, oriented to place, oriented to person. No depression, no anxiety, no agitation.  Eyes: Normal conjunctivae. Normal eyelids.  Ears, Nose, Mouth, and Throat: Left ear no scars, no lesions, no masses. Right ear no scars, no lesions, no masses. Nose no scars, no lesions, no masses. Normal hearing. Normal lips.  Musculoskeletal: Normal gait and station of head and neck.     Complexity of Data:  Source Of History:  Patient  Records Review:   Previous Doctor Records, Previous Patient Records  Urine Test Review:   Urinalysis, Urine Culture   PROCEDURES:         PVR Ultrasound - 91638  Scanned Volume: 26 cc         Urinalysis w/Scope - 81001 Dipstick Dipstick Cont'd Micro  Color: Red Bilirubin: Invalid mg/dL WBC/hpf: 10 - 20/hpf  Appearance: Cloudy Ketones: Invalid mg/dL RBC/hpf: Packed/hpf  Specific Gravity: Invalid Blood: Invalid ery/uL Bacteria: Mod (26-50/hpf)   pH: Invalid Protein: Invalid mg/dL Cystals: NS (Not Seen)  Glucose: Invalid mg/dL Urobilinogen: Invalid mg/dL Casts: NS (Not Seen)    Nitrites: Invalid Trichomonas: Not Present    Leukocyte Esterase: Invalid leu/uL Mucous: Present      Epithelial Cells: 0 - 5/hpf      Yeast: NS (Not Seen)      Sperm: Not Present    Notes: Chemstries not performed due to color interference and clarity.    ASSESSMENT:      ICD-10 Details  1 GU:   Gross hematuria - G66.5 Acute, Complicated Injury  2   Acute Cystitis/UTI - L93.57 Acute, Complicated Injury  3   Ureteral calculus - S17.7 Acute, Complicated Injury   PLAN:           Orders Labs CULTURE, URINE          Document Letter(s):  Created for Patient: Clinical Summary         Notes:   Urine sent for culture, initiated Cipro 5 mg twice daily until C&S results available. I recommended that the patient stop his Xarelto as currently he is not in atrial fibrillation and I think the risk of bleeding outweighs the risk of intermittent atrial  fibrillation currently. Will plan for cystoscopy left ureteroscopy and laser lithotripsy of his ureteral calculus hopefully next week. Risk and benefits discussed as outlined below.  I have recommended retrograde pyelogram, Left ureteroscopic stone manipulation with laser lithotripsy. I have discussed in detail the risks, benefits and alternatives of ureteroscopic stone extraction to include but not limited to: Bleeding, infection, ureteral perforation with need for open repair, inability to place the stent necessitating the need for further procedures, possible percutaneous nephrostomy tube placement, discomfort from the stents, hematuria, urgency, frequency and refractory problems after the stent is removed. I discussed the stent is not a permanent stent and will require a followup for stent removal or stent exchange. The patient knows there is high risk for ureteral stent incrustation if this is not removed or  exchanged within 3 months. Patient voices understanding of the risks and benefits of the procedure and consents to the procedure.

## 2022-12-02 NOTE — Progress Notes (Signed)
[place]   [phone] [phone]   Clinic Follow up Note   Patient Care Team: [name], MD as PCP - General (Internal Medicine) [name], NP as PCP - Hematology/Oncology (Nurse Practitioner) [name], DO as PCP - Cardiology (Cardiology) [name], MD as Consulting Physician (Orthopedic Surgery) [name], MD as Consulting Physician (Cardiology) [name], MD as Consulting Physician (Oncology)  Date of Service:  12/04/2022  CHIEF COMPLAINT: f/u of metastatic pancreatic cancer     CURRENT THERAPY:  First line FOLFIRINOX, starting 08/13/21, now q3weeks              -irinotecan switched to liposomal 09/26/22   ASSESSMENT:  Zachary Reid is a 67 y.o. male with   Pancreatic adenocarcinoma ([place]) [id number] with numerous small cavitary lesions in bilateral lungs, MMR normal  -diagnosed 07/2021, after incidental finding on CT scan for kidney stone, by colonoscopy/EUS. -Baseline CA 19-9 elevated to 273 on 07/03/21 -He began first-line mFOLFIRINOX on 08/13/21, but required dose reduction and move to every 3 weeks. -his CA 19-9 trended up but is now stable. -restaging CT CAP 08/12/22 showed: multiple new and enlarging pulmonary metastases; pancreatic mass grossly stable. Will repeat in 3 months. -we changed his irinotecan to liposomal on 09/05/22. He tolerated better and feels stronger. -He did not tolerated Lip-irrinotecan at 60 mg/m2 dose at C2, will reduce back to '50mg'$ /m2 from cycle 3  -restaging CT from November 29, 2022 showed slightly worsening pulmonary metastasis, and enlarged retroperitoneal lymph node.  Compared to his previous CT 6 months ago, the changes are significant and meet care for cancer progression.  His tumor marker CA 19.9 has been trending up the past few months.  -I discussed the CT scan findings with patient and his wife, I recommended changing his treatment to second-line gemcitabine and Abraxane.  Benefit  and potential side effect discussed with patient and his wife.  Patient had many questions, after lengthy discussion, he agrees to proceed, start in 3 weeks.  Diabetes ([place]) -continue medications -Follow-up with endocrinology -Will monitor his blood glucose when he is on chemo    Goal of care discussion, DNR  -We again discussed the incurable nature of his cancer, and the overall poor prognosis, especially if he does not have good response to chemotherapy or progress on chemo -The patient understands the goal of care is palliative. -I recommend DNR/DNI, he agreed today. But he wants life support if his condition is not related to cancer, especially during procedure, surgery etc. I documented   Anxiety -On Xanax as needed  -He has had counseling -Denies depression.  -I encouraged him to cut his walking hours, to balance his work and quality of life  PLAN: - Discuss CT scan Results, which showed cancer progression - Discuss changing treatments to Gemcitabine and Abraxane every 2 weeks  and side effects -Encourage the pt to stay active. -proceed with C8 FOLFIRINOX with liposomal irinotecan today at same dose  - I refill Compazine,Tramadol, Xanax, Ambien -lab ,f/u and first cycle gem/abraxane 12/26/2022  SUMMARY OF ONCOLOGIC HISTORY: Oncology History Overview Note   Cancer Staging  Pancreatic adenocarcinoma [facility]) Staging form: Exocrine Pancreas, AJCC 8th Edition - Clinical stage from 07/26/2021: Stage IV (cT4, cN0, cM1) - Signed by [name], MD on 07/28/2021    Pancreatic adenocarcinoma ([place])  07/02/2021 Initial Diagnosis   Pancreatic adenocarcinoma ([place])   07/26/2021 Cancer Staging   Staging form: Exocrine Pancreas, AJCC 8th Edition - Clinical stage from  07/26/2021: Stage IV (cT4, cN0, cM1) - Signed by [name], MD on 07/28/2021 Stage prefix: Initial diagnosis Total positive nodes: 0   08/13/2021 - 07/06/2022 Chemotherapy   Patient is on Treatment Plan : PANCREAS Modified FOLFIRINOX  q14d x 4 cycles      Genetic Testing   [facility] CancerNext-Expanded results (77 genes) were negative. No pathogenic variants were identified. 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 The report date is 08/15/2021.    The CancerNext-Expanded gene panel offered by [facility] and includes sequencing, rearrangement, and RNA analysis for the following 77 genes: AIP, ALK, APC, ATM, AXIN2, BAP1, BARD1, BLM, BMPR1A, BRCA1, BRCA2, BRIP1, CDC73, CDH1, CDK4, CDKN1B, CDKN2A, CHEK2, CTNNA1, DICER1, FANCC, FH, FLCN, GALNT12, KIF1B, LZTR1, MAX, MEN1, MET, MLH1, MSH2, MSH3, MSH6, MUTYH, NBN, NF1, NF2, NTHL1, PALB2, PHOX2B, PMS2, POT1, PRKAR1A, PTCH1, PTEN, RAD51C, RAD51D, RB1, RECQL, RET, SDHA, SDHAF2, SDHB, SDHC, SDHD, SMAD4, SMARCA4, SMARCB1, SMARCE1, STK11, SUFU, TMEM127, TP53, TSC1, TSC2, VHL and XRCC2 (sequencing and deletion/duplication); EGFR, EGLN1, HOXB13, KIT, MITF, PDGFRA, POLD1, and POLE (sequencing only); EPCAM and GREM1 (deletion/duplication only).     08/13/2021 -  Chemotherapy   Patient is on Treatment Plan : PANCREAS Modified FOLFIRINOX q14d x 4 cycles     10/25/2021 Imaging   EXAM: CT CHEST, ABDOMEN, AND PELVIS WITH CONTRAST  IMPRESSION: 1. Innumerable bilateral pulmonary nodules, many of which are cavitary, consistent with metastatic disease. These nodules show no substantial change in are minimally progressed in the interval. 2. Mix cystic and solid lesion in the head and body of the pancreas is similar to prior and also comparing back to MRI 07/02/2021. 3. Hepatic cysts. 4. 8 mm nonobstructing left renal stone. 5. Aortic Atherosclerosis (ICD10-I70.0).   01/28/2022 Imaging   EXAM: CT CHEST, ABDOMEN, AND PELVIS WITH CONTRAST  IMPRESSION: 1. Innumerable bilateral small solid and cavitary pulmonary nodules, some of the cavitary nodules are slightly less thick-walled when compared with prior exam. 2. Mixed cystic and solid lesion in the head and body of  the pancreas is similar to prior exam. 3. Nonobstructing left renal stone. 4.  Aortic Atherosclerosis (ICD10-I70.0).   11/29/2022 Imaging    IMPRESSION: 1. No significant change in primary pancreatic mass and adjacent cystic component. Unchanged appearance of soft tissue extending to involve the adjacent celiac axis, superior mesenteric artery origin, and portal confluence. 2. 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 3. Innumerable small pulmonary nodules throughout the lungs, the majority of which are cavitary. Although interval change is difficult to appreciate for the majority of these nodules due to small size, at least some of these are slightly enlarged, consistent with worsened pulmonary metastatic disease. 4. Newly enlarged left retroperitoneal lymph nodes, which may reflect nodal metastatic disease or perhaps reactive to left hydronephrosis. Attention on follow-up. 5. A sizable calculus previously seen in the left renal pelvis has migrated to the middle third of the left ureter, with placement of a double-J left ureteral stent, with formed pigtails in the left renal pelvis and urinary bladder. Moderate left hydronephrosis and proximal hydroureter. 6. Coronary artery disease.   Aortic Atherosclerosis (ICD10-I70.0).      INTERVAL HISTORY:  Zachary Reid is here for a follow up of metastatic pancreatic cancer.  He was last seen by me on 10/17/2022 He presents to the clinic accompanied by wife. Pt states he takes Xanax x2 a day. Pt stake Compazine 1-2  pills after having treatment.  Tramadol he take 3 a day 1 in morning and 2 at night. Pt states his pain is a little better after the kidney stone was remove on 12/03/22. PT reports of having Afib. Pt has questions about the change of treatment.Pt still working.        All other systems were reviewed with the patient and are negative.  MEDICAL  HISTORY:  Past Medical History:  Diagnosis Date   Adenocarcinoma of pancreas, stage 4 ([place]) 07/2021   oncologist--- dr Burr Medico;   mets to  bilateral lung;  started chemo 08-13-2021   Anemia    GAD (generalized anxiety disorder)    History of adenomatous polyp of colon    Hyperlipidemia    Hypogonadism male    IBS (irritable bowel syndrome)    Insomnia    Left ureteral calculus    Metastatic cancer to lung ([place]) 07/2021   primary pancreatic cancer w/ numerous small cavity lesions bilateral lungs   PAF (paroxysmal atrial fibrillation) ([place]) 10/31/2022   cardiologist-- dr Raliegh Ip. tobb;  newly dx ED 10-31-2022 w/ RVR  in setting flank pain (kidney stone)  --  office note in epic 11-07-2022 normal ETT 11-13-2022, echo 11-07-2022 ef 65-70% w/ mild LVH, mild MR,  zio monitor not completed,  started on toprol and xarelto daily   Personal history of chemotherapy    Receiving chemotherapy for metastatic pancreatic cancer. Most recent infusion (as of 12/02/22) was on 11/15/22.   Port-A-Cath in place 08/10/2021   Type 2 diabetes mellitus treated with insulin [facility])    endocrinologist--- dr Dwyane Dee   Vitamin D deficiency    takes Vit D supplements   Wears contact lenses     SURGICAL HISTORY: Past Surgical History:  Procedure Laterality Date   APPENDECTOMY  1988   BIOPSY  07/26/2021   Procedure: BIOPSY;  Surgeon: Irving Copas., MD;  Location: [place];  Service: Gastroenterology;;   COLONOSCOPY WITH PROPOFOL N/A 07/26/2021   Procedure: COLONOSCOPY WITH PROPOFOL;  Surgeon: Irving Copas., MD;  Location: [place];  Service: Gastroenterology;  Laterality: N/A;   CYSTOSCOPY WITH STENT PLACEMENT Left 10/31/2022   Procedure: CYSTOSCOPY WITH STENT PLACEMENT;  Surgeon: [name], MD;  Location: [facility];  Service: Urology;  Laterality: Left;   CYSTOSCOPY/URETEROSCOPY/HOLMIUM LASER/STENT PLACEMENT Left 12/03/2022   Procedure: CYSTOSCOPY/LEFT URETEROSCOPY/HOLMIUM LASER/STENT  EXCHANGE;  Surgeon: [name], MD;  Location: [facility];  Service: Urology;  Laterality: Left;  1 HR FOR CASE   ESOPHAGOGASTRODUODENOSCOPY (EGD) WITH PROPOFOL N/A 07/26/2021   Procedure: ESOPHAGOGASTRODUODENOSCOPY (EGD) WITH PROPOFOL;  Surgeon: Rush Landmark Telford Nab., MD;  Location: [place];  Service: Gastroenterology;  Laterality: N/A;   EUS N/A 07/26/2021   Procedure: UPPER ENDOSCOPIC ULTRASOUND (EUS) RADIAL;  Surgeon: Irving Copas., MD;  Location: [place];  Service: Gastroenterology;  Laterality: N/A;   FINE NEEDLE ASPIRATION  07/26/2021   Procedure: FINE NEEDLE ASPIRATION (FNA) LINEAR;  Surgeon: Rush Landmark Telford Nab., MD;  Location: [place];  Service: Gastroenterology;;   IR IMAGING GUIDED PORT INSERTION  08/10/2021   POLYPECTOMY  07/26/2021   Procedure: POLYPECTOMY;  Surgeon: Rush Landmark Telford Nab., MD;  Location: [place];  Service: Gastroenterology;;   SHOULDER SURGERY Right    as a teenager    I have reviewed the social history and family history with the patient and they are unchanged from previous note.  ALLERGIES:  is allergic to lipitor [atorvastatin].  MEDICATIONS:  Current Outpatient Medications  Medication Sig Dispense Refill   ALPRAZolam Duanne Moron)  0.25 MG tablet Take 1-2 tablets (0.25-0.5 mg total) by mouth 2 (two) times daily as needed for anxiety. 60 tablet 0   Cholecalciferol (VITAMIN D3) 125 MCG (5000 UT) CAPS Take 5,000 Units by mouth in the morning and at bedtime.     Continuous Blood Gluc Sensor (DEXCOM G7 SENSOR) MISC 1 Device by Does not apply route as directed. Change sensor every 10 days (Patient taking differently: 1 Device by Does not apply route as directed. Change sensor every 10 days Per patient on 12/02/22, he has not yet received glucose monitor.) 3 each 3   gabapentin (NEURONTIN) 600 MG tablet Take 1 tablet (600 mg total) by mouth 3 (three) times daily. (Patient taking differently: Take 600 mg by mouth  3 (three) times daily as needed (pain).) 90 tablet 1   glucose blood test strip Use as instructed 100 each 12   HYDROcodone-acetaminophen (NORCO/VICODIN) 5-325 MG tablet Take 1 tablet by mouth every 4 (four) hours as needed for moderate pain. (Patient not taking: Reported on 12/02/2022) 20 tablet 0   hyoscyamine (LEVSIN SL) 0.125 MG SL tablet DISSOLVE ONE TABLET UNDER THE TONGUE 4 TIMES DAILY UP TO EVERY 4 HOURS AS NEEDED FOR NAUSEA, BLOATING, CRAMPING, OR DIARRHEA (Patient not taking: Reported on 12/02/2022) 100 tablet 0   insulin degludec (TRESIBA FLEXTOUCH) 100 UNIT/ML FlexTouch Pen Inject 30 Units into the skin daily.     insulin lispro (HUMALOG KWIKPEN) 100 UNIT/ML KwikPen 10 TO 15 UNITS BEFORE MEALS AS DIRECTED (Patient taking differently: Inject 10-12 Units into the skin 3 (three) times daily before meals.) 15 mL 1   metoprolol succinate (TOPROL XL) 25 MG 24 hr tablet Take 0.5 tablets (12.5 mg total) by mouth daily. (Patient taking differently: Take 12.5 mg by mouth daily. Per patient, he will start taking Metoprolol on 12/02/22 and take again on the day of surgery (12/03/22.)) 45 tablet 3   oxyCODONE (ROXICODONE) 5 MG immediate release tablet Take 1 tablet (5 mg total) by mouth every 6 (six) hours as needed for up to 30 doses for severe pain. 30 tablet 0   prochlorperazine (COMPAZINE) 10 MG tablet Take 1 tablet (10 mg total) by mouth every 6 (six) hours as needed (Nausea or vomiting). 60 tablet 1   rivaroxaban (XARELTO) 20 MG TABS tablet Take 1 tablet (20 mg total) by mouth daily with supper. (Patient taking differently: Take 20 mg by mouth daily with supper. Patient stated that he last took Xarelto on 11/30/22.) 90 tablet 3   traMADol (ULTRAM) 50 MG tablet Take 1-2 tablets (50-100 mg total) by mouth every 6 (six) hours as needed. 120 tablet 0   zolpidem (AMBIEN) 10 MG tablet Take 1 tablet (10 mg total) by mouth at bedtime as needed for sleep. 30 tablet 0   No current facility-administered  medications for this visit.    PHYSICAL EXAMINATION: ECOG PERFORMANCE STATUS: 1 - Symptomatic but completely ambulatory  Vitals:   12/04/22 0940  BP: 119/65  Pulse: 74  Resp: 18  Temp: 98 F (36.7 C)  SpO2: 100%   Wt Readings from Last 3 Encounters:  12/04/22 185 lb 8 oz (84.1 kg)  12/03/22 183 lb 14.4 oz (83.4 kg)  11/19/22 178 lb 12.8 oz (81.1 kg)     GENERAL:alert, no distress and comfortable SKIN: skin color normal, no rashes or significant lesions EYES: normal, Conjunctiva are pink and non-injected, sclera clear  NEURO: alert & oriented x 3 with fluent speech  LABORATORY DATA:  I have reviewed the  data as listed    Latest Ref Rng & Units 12/04/2022    9:22 AM 12/03/2022   11:43 AM 11/13/2022    9:03 AM  CBC  WBC 4.0 - 10.5 K/uL 4.7   6.6   Hemoglobin 13.0 - 17.0 g/dL 9.0  9.9  11.0   Hematocrit 39.0 - 52.0 % 25.3  29.0  30.9   Platelets 150 - 400 K/uL 118   96         Latest Ref Rng & Units 12/04/2022    9:22 AM 12/03/2022   11:43 AM 11/13/2022    9:03 AM  CMP  Glucose 70 - 99 mg/dL 286  175  247   BUN 8 - 23 mg/dL '14  13  14   '$ Creatinine 0.61 - 1.24 mg/dL 0.93  0.80  0.97   Sodium 135 - 145 mmol/L 136  140  139   Potassium 3.5 - 5.1 mmol/L 3.9  4.4  3.5   Chloride 98 - 111 mmol/L 104  104  106   CO2 22 - 32 mmol/L 27   28   Calcium 8.9 - 10.3 mg/dL 9.0   9.0   Total Protein 6.5 - 8.1 g/dL 5.9   6.6   Total Bilirubin 0.3 - 1.2 mg/dL 0.4   0.4   Alkaline Phos 38 - 126 U/L 97   92   AST 15 - 41 U/L 17   21   ALT 0 - 44 U/L 17   21       RADIOGRAPHIC STUDIES: I have personally reviewed the radiological images as listed and agreed with the findings in the report. No results found.    Orders Placed This Encounter  Procedures   Do not attempt resuscitation (DNR)    Order Specific Question:   If patient has no pulse and is not breathing    Answer:   Do Not Attempt Resuscitation    Order Specific Question:   If patient has a pulse and/or is  breathing: Medical Treatment Goals    Answer:   MEDICAL INTERVENTIONS DESIRED: Use advanced airway interventions, mechanical ventilation or cardioversion in appropriate circumstances; Use medication/IV fluids as indicated; Provide comfort medications; Transfer to Progressive/Stepdown/ICU as indicated.    Order Specific Question:   Consent:    Answer:   Discussion documented in EHR or advanced directives reviewed   All questions were answered. The patient knows to call the clinic with any problems, questions or concerns. No barriers to learning was detected. The total time spent in the appointment was 45 minutes.     [name], MD 12/04/2022   Felicity Coyer, CMA, am acting as scribe for [name], MD.   I have reviewed the above documentation for accuracy and completeness, and I agree with the above.

## 2022-12-02 NOTE — Progress Notes (Addendum)
Spoke w/ via phone for pre-op interview---[facility] needs dos---- ISTAT              Lab results------11/13/22 cbc and cmp in Epic, 11/08/22 EKG in chart & Epic, 11/27/22 Echocardiogram in [place] test -----patient states asymptomatic no test needed Arrive at -------1100 on Tuesday, 12/03/22 NPO after MN NO Solid Food.  Clear liquids from MN until---1000 Med rec completed Medications to take morning of surgery -----Metoprolol, Xanax prn, Gabapentin prn, Tramadol prn Diabetic medication -----Take 1/2 dose of Tresiba the night before surgery. Hold insulin lispro on the morning of surgery. Patient instructed no nail polish to be worn day of surgery Patient instructed to bring photo id and insurance card day of surgery Patient aware to have Driver (ride ) / caregiver    for 24 hours after surgery - wife, Mickel Baas Patient Special Instructions -----none Pre-Op special Istructions -----none Patient verbalized understanding of instructions that were given at this phone interview. Patient denies shortness of breath, chest pain, fever, cough at this phone interview.  Per patient on 12/02/22, he had not started taking Metoprolol daily. I instructed him to start taking Metoprolol daily as prescribed on 12/02/22. He also said that he had not started taking Xarelto regularly. He stated that he last took Xarelto on 11/30/22. Per patient, he was instructed by cardiology to hold Xarelto 2 - 3 days prior to surgery. Please see progress note dated 11/29/22 by Jeral Fruit, RN.  See cardiology note dated 11/07/2022 by [name] for surgical clearance in chart & Epic.

## 2022-12-03 ENCOUNTER — Ambulatory Visit (HOSPITAL_BASED_OUTPATIENT_CLINIC_OR_DEPARTMENT_OTHER): Payer: Medicare Other | Admitting: Anesthesiology

## 2022-12-03 ENCOUNTER — Encounter (HOSPITAL_BASED_OUTPATIENT_CLINIC_OR_DEPARTMENT_OTHER): Payer: Self-pay | Admitting: Urology

## 2022-12-03 ENCOUNTER — Other Ambulatory Visit: Payer: Self-pay | Admitting: Hematology

## 2022-12-03 ENCOUNTER — Ambulatory Visit (HOSPITAL_BASED_OUTPATIENT_CLINIC_OR_DEPARTMENT_OTHER)
Admission: RE | Admit: 2022-12-03 | Discharge: 2022-12-03 | Disposition: A | Discharge status: Home / Self Care | Payer: Medicare Other | Attending: Urology | Admitting: Urology

## 2022-12-03 ENCOUNTER — Encounter (HOSPITAL_BASED_OUTPATIENT_CLINIC_OR_DEPARTMENT_OTHER)
Admission: RE | Disposition: A | Discharge status: Home / Self Care | Payer: Self-pay | Source: Home / Self Care | Attending: Urology

## 2022-12-03 DIAGNOSIS — N201 Calculus of ureter: Secondary | ICD-10-CM

## 2022-12-03 DIAGNOSIS — Z79899 Other long term (current) drug therapy: Secondary | ICD-10-CM | POA: Diagnosis not present

## 2022-12-03 DIAGNOSIS — Z9221 Personal history of antineoplastic chemotherapy: Secondary | ICD-10-CM | POA: Diagnosis not present

## 2022-12-03 DIAGNOSIS — I10 Essential (primary) hypertension: Secondary | ICD-10-CM | POA: Insufficient documentation

## 2022-12-03 DIAGNOSIS — Z833 Family history of diabetes mellitus: Secondary | ICD-10-CM | POA: Insufficient documentation

## 2022-12-03 DIAGNOSIS — Z01818 Encounter for other preprocedural examination: Secondary | ICD-10-CM

## 2022-12-03 DIAGNOSIS — Z794 Long term (current) use of insulin: Secondary | ICD-10-CM | POA: Insufficient documentation

## 2022-12-03 DIAGNOSIS — N3001 Acute cystitis with hematuria: Secondary | ICD-10-CM | POA: Insufficient documentation

## 2022-12-03 DIAGNOSIS — E119 Type 2 diabetes mellitus without complications: Secondary | ICD-10-CM

## 2022-12-03 DIAGNOSIS — Z7901 Long term (current) use of anticoagulants: Secondary | ICD-10-CM | POA: Insufficient documentation

## 2022-12-03 DIAGNOSIS — C7800 Secondary malignant neoplasm of unspecified lung: Secondary | ICD-10-CM | POA: Diagnosis not present

## 2022-12-03 DIAGNOSIS — Z8507 Personal history of malignant neoplasm of pancreas: Secondary | ICD-10-CM | POA: Diagnosis not present

## 2022-12-03 DIAGNOSIS — C259 Malignant neoplasm of pancreas, unspecified: Secondary | ICD-10-CM

## 2022-12-03 HISTORY — DX: Generalized anxiety disorder: F41.1

## 2022-12-03 HISTORY — PX: CYSTOSCOPY/URETEROSCOPY/HOLMIUM LASER/STENT PLACEMENT: SHX6546

## 2022-12-03 HISTORY — DX: Personal history of adenomatous and serrated colon polyps: Z86.0101

## 2022-12-03 HISTORY — DX: Insomnia, unspecified: G47.00

## 2022-12-03 HISTORY — DX: Personal history of antineoplastic chemotherapy: Z92.21

## 2022-12-03 HISTORY — DX: Presence of spectacles and contact lenses: Z97.3

## 2022-12-03 HISTORY — DX: Anemia, unspecified: D64.9

## 2022-12-03 HISTORY — DX: Personal history of colonic polyps: Z86.010

## 2022-12-03 HISTORY — DX: Long term (current) use of insulin: Z79.4

## 2022-12-03 HISTORY — DX: Type 2 diabetes mellitus without complications: E11.9

## 2022-12-03 HISTORY — DX: Calculus of ureter: N20.1

## 2022-12-03 LAB — GLUCOSE, CAPILLARY: Glucose-Capillary: 149 mg/dL — ABNORMAL HIGH (ref 70–99)

## 2022-12-03 SURGERY — CYSTOSCOPY/URETEROSCOPY/HOLMIUM LASER/STENT PLACEMENT
Anesthesia: General | Site: Ureter | Laterality: Left

## 2022-12-03 MED ORDER — LIDOCAINE HCL (CARDIAC) PF 100 MG/5ML IV SOSY
PREFILLED_SYRINGE | INTRAVENOUS | Status: DC | PRN
  Administered 2022-12-03: 50 mg via INTRAVENOUS

## 2022-12-03 MED ORDER — VASOPRESSIN 20 UNIT/ML IV SOLN
INTRAVENOUS | Status: DC | PRN
  Administered 2022-12-03: 1 [IU] via INTRAVENOUS

## 2022-12-03 MED ORDER — PHENYLEPHRINE 80 MCG/ML (10ML) SYRINGE FOR IV PUSH (FOR BLOOD PRESSURE SUPPORT)
PREFILLED_SYRINGE | INTRAVENOUS | Status: AC
  Filled 2022-12-03: qty 30

## 2022-12-03 MED ORDER — MIDAZOLAM HCL 2 MG/2ML IJ SOLN
INTRAMUSCULAR | Status: AC
  Filled 2022-12-03: qty 2

## 2022-12-03 MED ORDER — CEFAZOLIN SODIUM-DEXTROSE 2-4 GM/100ML-% IV SOLN
INTRAVENOUS | Status: AC
  Filled 2022-12-03: qty 100

## 2022-12-03 MED ORDER — ONDANSETRON HCL 4 MG/2ML IJ SOLN
INTRAMUSCULAR | Status: DC | PRN
  Administered 2022-12-03: 4 mg via INTRAVENOUS

## 2022-12-03 MED ORDER — OXYCODONE HCL 5 MG/5ML PO SOLN: 5.0000 mg | Freq: Once | ORAL | Status: DC | PRN

## 2022-12-03 MED ORDER — OXYCODONE HCL 5 MG PO TABS: 5.0000 mg | ORAL_TABLET | Freq: Once | ORAL | Status: DC | PRN

## 2022-12-03 MED ORDER — CEFAZOLIN SODIUM-DEXTROSE 2-4 GM/100ML-% IV SOLN
2.0000 g | INTRAVENOUS | Status: AC
  Administered 2022-12-03: 2 g via INTRAVENOUS

## 2022-12-03 MED ORDER — EPHEDRINE SULFATE (PRESSORS) 50 MG/ML IJ SOLN
INTRAMUSCULAR | Status: DC | PRN
  Administered 2022-12-03: 10 mg via INTRAVENOUS
  Administered 2022-12-03 (×3): 5 mg via INTRAVENOUS
  Administered 2022-12-03: 10 mg via INTRAVENOUS
  Administered 2022-12-03: 5 mg via INTRAVENOUS

## 2022-12-03 MED ORDER — ONDANSETRON HCL 4 MG/2ML IJ SOLN: 4.0000 mg | Freq: Once | INTRAMUSCULAR | Status: DC | PRN

## 2022-12-03 MED ORDER — LIDOCAINE HCL (PF) 2 % IJ SOLN
INTRAMUSCULAR | Status: AC
  Filled 2022-12-03: qty 20

## 2022-12-03 MED ORDER — MIDAZOLAM HCL 2 MG/2ML IJ SOLN
INTRAMUSCULAR | Status: DC | PRN
  Administered 2022-12-03: 2 mg via INTRAVENOUS

## 2022-12-03 MED ORDER — FENTANYL CITRATE (PF) 100 MCG/2ML IJ SOLN
INTRAMUSCULAR | Status: DC | PRN
  Administered 2022-12-03: 25 ug via INTRAVENOUS
  Administered 2022-12-03: 50 ug via INTRAVENOUS

## 2022-12-03 MED ORDER — PHENYLEPHRINE HCL (PRESSORS) 10 MG/ML IV SOLN
INTRAVENOUS | Status: AC
  Filled 2022-12-03: qty 1

## 2022-12-03 MED ORDER — PROPOFOL 500 MG/50ML IV EMUL
INTRAVENOUS | Status: AC
  Filled 2022-12-03: qty 50

## 2022-12-03 MED ORDER — PROPOFOL 1000 MG/100ML IV EMUL
INTRAVENOUS | Status: AC
  Filled 2022-12-03: qty 100

## 2022-12-03 MED ORDER — FENTANYL CITRATE (PF) 100 MCG/2ML IJ SOLN
INTRAMUSCULAR | Status: AC
  Filled 2022-12-03: qty 2

## 2022-12-03 MED ORDER — PROPOFOL 10 MG/ML IV BOLUS
INTRAVENOUS | Status: DC | PRN
  Administered 2022-12-03: 40 mg via INTRAVENOUS
  Administered 2022-12-03: 150 mg via INTRAVENOUS

## 2022-12-03 MED ORDER — DEXAMETHASONE SODIUM PHOSPHATE 4 MG/ML IJ SOLN
INTRAMUSCULAR | Status: DC | PRN
  Administered 2022-12-03: 5 mg via INTRAVENOUS

## 2022-12-03 MED ORDER — SODIUM CHLORIDE 0.9 % IR SOLN
Status: DC | PRN
  Administered 2022-12-03 (×2): 3000 mL

## 2022-12-03 MED ORDER — FENTANYL CITRATE (PF) 100 MCG/2ML IJ SOLN: 25.0000 ug | INTRAMUSCULAR | Status: DC | PRN

## 2022-12-03 MED ORDER — EPHEDRINE 5 MG/ML INJ
INTRAVENOUS | Status: AC
  Filled 2022-12-03: qty 20

## 2022-12-03 MED ORDER — LACTATED RINGERS IV SOLN: INTRAVENOUS | Status: DC

## 2022-12-03 MED ORDER — IOHEXOL 300 MG/ML  SOLN
INTRAMUSCULAR | Status: DC | PRN
  Administered 2022-12-03: 10 mL

## 2022-12-03 MED ORDER — PHENYLEPHRINE HCL (PRESSORS) 10 MG/ML IV SOLN
INTRAVENOUS | Status: DC | PRN
  Administered 2022-12-03 (×10): 80 ug via INTRAVENOUS

## 2022-12-03 MED ORDER — KETOROLAC TROMETHAMINE 30 MG/ML IJ SOLN: 30.0000 mg | Freq: Once | INTRAMUSCULAR | Status: DC | PRN

## 2022-12-03 MED FILL — Dexamethasone Sodium Phosphate Inj 100 MG/10ML: INTRAMUSCULAR | Qty: 1 | Status: AC

## 2022-12-03 MED FILL — Fosaprepitant Dimeglumine For IV Infusion 150 MG (Base Eq): INTRAVENOUS | Qty: 5 | Status: AC

## 2022-12-03 SURGICAL SUPPLY — 27 items
BAG DRAIN URO-CYSTO SKYTR STRL (DRAIN) ×1 IMPLANT
BAG DRN UROCATH (DRAIN) ×1
BASKET STONE 1.7 NGAGE (UROLOGICAL SUPPLIES) IMPLANT
BULB IRRIG PATHFIND (MISCELLANEOUS) IMPLANT
CATH URETL OPEN 5X70 (CATHETERS) ×1 IMPLANT
CLOTH BEACON ORANGE TIMEOUT ST (SAFETY) ×1 IMPLANT
EXTRACTOR STONE 1.7FRX115CM (UROLOGICAL SUPPLIES) IMPLANT
FIBER LASER FLEXIVA 365 (UROLOGICAL SUPPLIES) IMPLANT
GLOVE BIO SURGEON STRL SZ7.5 (GLOVE) ×1 IMPLANT
GOWN STRL REUS W/TWL XL LVL3 (GOWN DISPOSABLE) ×1 IMPLANT
GUIDEWIRE STR DUAL SENSOR (WIRE) ×1 IMPLANT
GUIDEWIRE ZIPWRE .038 STRAIGHT (WIRE) IMPLANT
IV NS 1000ML (IV SOLUTION)
IV NS 1000ML BAXH (IV SOLUTION) ×1 IMPLANT
IV NS IRRIG 3000ML ARTHROMATIC (IV SOLUTION) ×1 IMPLANT
KIT TURNOVER CYSTO (KITS) ×1 IMPLANT
MANIFOLD NEPTUNE II (INSTRUMENTS) ×1 IMPLANT
NS IRRIG 500ML POUR BTL (IV SOLUTION) ×1 IMPLANT
PACK CYSTO (CUSTOM PROCEDURE TRAY) ×1 IMPLANT
SHEATH NAVIGATOR HD 11/13X36 (SHEATH) IMPLANT
STENT URET 6FRX26 CONTOUR (STENTS) IMPLANT
SYR 10ML LL (SYRINGE) ×1 IMPLANT
SYR 20ML LL LF (SYRINGE) ×1 IMPLANT
TRACTIP FLEXIVA PULS ID 200XHI (Laser) IMPLANT
TRACTIP FLEXIVA PULSE ID 200 (Laser) ×1
TUBE CONNECTING 12X1/4 (SUCTIONS) IMPLANT
TUBING UROLOGY SET (TUBING) ×1 IMPLANT

## 2022-12-03 NOTE — Anesthesia Preprocedure Evaluation (Signed)
Anesthesia Evaluation  Patient identified by MRN, date of birth, ID band Patient awake    Reviewed: Allergy & Precautions, H&P , NPO status , Patient's Chart, lab work & pertinent test results  Airway Mallampati: II  TM Distance: >3 FB Neck ROM: Full    Dental no notable dental hx.    Pulmonary neg pulmonary ROS  Metastatic cancer to lung ([place]) 07/2021 primary pancreatic cancer w/ numerous small cavity lesions bilateral lungs   Pulmonary exam normal breath sounds clear to auscultation       Cardiovascular hypertension, Pt. on medications and Pt. on home beta blockers Normal cardiovascular exam Rhythm:Regular Rate:Normal     Neuro/Psych negative neurological ROS  negative psych ROS   GI/Hepatic negative GI ROS, Neg liver ROS,,,  Endo/Other  diabetes, Type 2, Insulin Dependent    Renal/GU negative Renal ROS  negative genitourinary   Musculoskeletal negative musculoskeletal ROS (+)    Abdominal   Peds negative pediatric ROS (+)  Hematology negative hematology ROS (+)   Anesthesia Other Findings   Reproductive/Obstetrics negative OB ROS                             Anesthesia Physical Anesthesia Plan  ASA: 3  Anesthesia Plan: General   Post-op Pain Management: Minimal or no pain anticipated   Induction: Intravenous  PONV Risk Score and Plan: 2 and Ondansetron, Dexamethasone and Treatment may vary due to age or medical condition  Airway Management Planned: LMA  Additional Equipment:   Intra-op Plan:   Post-operative Plan: Extubation in OR  Informed Consent: I have reviewed the patients History and Physical, chart, labs and discussed the procedure including the risks, benefits and alternatives for the proposed anesthesia with the patient or authorized representative who has indicated his/her understanding and acceptance.     Dental advisory given  Plan Discussed with: CRNA and  Surgeon  Anesthesia Plan Comments:        Anesthesia Quick Evaluation

## 2022-12-03 NOTE — Op Note (Signed)
Preoperative diagnosis:  1.  Left mid ureteral calculus  Postoperative diagnosis: 1.  Left mid ureteral calculus  Procedure(s): 1.  Cystoscopy, left retrograde pyelogram with intraoperative interpretation, left ureteroscopy with laser lithotripsy of left mid ureteral calculus, stone extraction, left JJ stent exchange  Surgeon: Dr. Harold Barban  Anesthesia: General  Complications: None  EBL: Minimal  Specimens: Kidney stone fragments  Disposition of specimens: With patient  Intraoperative findings: 1 cm impacted left mid ureteral calculus just proximal to the iliac vessel crossing.  Stone completely fragmented and all fragments extracted or irrigated out.  Significant ureteral edema in the area of impaction.  6 [place] by 26 cm Percuflex plus soft contour stent placed with tether to the urethral meatus at termination of the case.  Patient did have episode of hypotension at termination of the case which rapidly responded to pressors and fluids but was hemodynamically stable following this and stable at termination of the case.  Indication: 67 year old white male presented about 3 weeks ago with significant left-sided flank pain found to have a 1 cm left mid ureteral calculus.  Underwent urgent stenting at that time.  Now presents to undergo cystoscopy left ureteroscopy laser lithotripsy for definitive management of his stone  Description of procedure:  After obtaining informed consent the patient was taken major cystoscopy suite placed under general anesthesia.  Placed in dorsolithotomy position genitalia prepped and draped in usual sterile fashion.  Proper pause timeout performed for site of procedure.  79 French cystoscope was advanced in the bladder without difficulty.  Left ureteral stent was identified and grasped and pulled just beyond the ureteral meatus.  A sensor wire was passed up through the stent and manipulated through the stent up to the left renal pelvis without difficulty  the stent was then removed leaving the guidewire in place.  6.4 French semirigid ureteroscope was advanced into the bladder alongside the wire and was able to manipulate up to the mid ureter at the level of the stone impaction site.  This point the 240 m holmium laser fiber was utilized and a power of 8 and a rate of 6 to fragment the stone into numerous small pieces.  The stone was somewhat impacted with significant edema around the stone.  Fragments were subsequently extracted utilizing an engage basket with several passes of the ureteroscope and also irrigating the stone fragments utilizing a Pathfinder irrigator.  Final look in the ureter revealed no significant fragments.  This scope was advanced along its length all the way up to the proximal ureter just beneath the UVJ and no further stone fragments were seen.  Retrograde pyelogram through the scope revealed no filling defects and good integrity of the collecting system and ureter with no extravasation of contrast.  At this point the patient became hypotensive according to anesthesiologist with pressure and the 96Q systolic.  He was placed in Trendelenburg position and was given pressors and fluid.  It may have been blood pressure cuff issue as he was never tachycardic and never had any other signs of hypotension.  Wire was back fed through the cystoscope and a 6 [place] by 26 cm Percuflex plus soft Contour stent was placed leaving a proximal coil in the upper pole calyx and a distal coil in the bladder.  Nylon suture was left on distal end of the stent left protruding to the urethral meatus to facilitate future removal.  The bladder was emptied procedure terminated.  He was awakened from anesthesia was stable at termination of the  case.

## 2022-12-03 NOTE — Transfer of Care (Signed)
Immediate Anesthesia Transfer of Care Note  Patient: Zachary Reid  Procedure(s) Performed: CYSTOSCOPY/LEFT URETEROSCOPY/HOLMIUM LASER/STENT EXCHANGE (Left: Ureter)  Patient Location: PACU  Anesthesia Type:General  Level of Consciousness: awake, alert , oriented, and patient cooperative  Airway & Oxygen Therapy: Patient Spontanous Breathing and Patient connected to nasal cannula oxygen  Post-op Assessment: Report given to RN and Post -op Vital signs reviewed and stable  Post vital signs: Reviewed and stable  Last Vitals:  Vitals Value Taken Time  BP 128/79 12/03/22 1551  Temp 36.5 C 12/03/22 1551  Pulse 75 12/03/22 1553  Resp 16 12/03/22 1553  SpO2 100 % 12/03/22 1553  Vitals shown include unvalidated device data.  Last Pain:  Vitals:   12/03/22 1126  TempSrc: Oral  PainSc: 0-No pain      Patients Stated Pain Goal: 4 (29/56/21 3086)  Complications: No notable events documented.

## 2022-12-03 NOTE — Assessment & Plan Note (Signed)
-  continue medications -Follow-up with endocrinology -Will monitor his blood glucose when he is on chemo

## 2022-12-03 NOTE — Discharge Instructions (Signed)
Post Anesthesia Home Care Instructions  Activity: Get plenty of rest for the remainder of the day. A responsible individual must stay with you for 24 hours following the procedure.  For the next 24 hours, DO NOT: -Drive a car -Operate machinery -Drink alcoholic beverages -Take any medication unless instructed by your physician -Make any legal decisions or sign important papers.  Meals: Start with liquid foods such as gelatin or soup. Progress to regular foods as tolerated. Avoid greasy, spicy, heavy foods. If nausea and/or vomiting occur, drink only clear liquids until the nausea and/or vomiting subsides. Call your physician if vomiting continues.  Special Instructions/Symptoms: Your throat may feel dry or sore from the anesthesia or the breathing tube placed in your throat during surgery. If this causes discomfort, gargle with warm salt water. The discomfort should disappear within 24 hours.   [facility] [phone] Post Ureteroscopy With Stent Instructions  Definitions:  Ureter: The duct that transports urine from the kidney to the bladder. Stent:   A plastic hollow tube that is placed into the ureter, from the kidney to the bladder to prevent the ureter from swelling shut.  GENERAL INSTRUCTIONS:  Despite the fact that no skin incisions were used, the area around the ureter and bladder is raw and irritated. The stent is a foreign body which will further irritate the bladder wall. This irritation is manifested by increased frequency of urination, both day and night, and by an increase in the urge to urinate. In some, the urge to urinate is present almost always. Sometimes the urge is strong enough that you may not be able to stop yourself from urinating. The only real cure is to remove the stent and then give time for the bladder wall to heal which can't be done until the danger of the ureter swelling shut has passed, which varies.  You may see some blood in your  urine while the stent is in place and a few days afterwards. Do not be alarmed, even if the urine was clear for a while. Get off your feet and drink lots of fluids until clearing occurs. If you start to pass clots or don't improve, call us.  DIET: You may return to your normal diet immediately. Because of the raw surface of your bladder, alcohol, spicy foods, acid type foods and drinks with caffeine may cause irritation or frequency and should be used in moderation. To keep your urine flowing freely and to avoid constipation, drink plenty of fluids during the day ( 8-10 glasses ). Tip: Avoid cranberry juice because it is very acidic.  ACTIVITY: Your physical activity doesn't need to be restricted. However, if you are very active, you may see some blood in your urine. We suggest that you reduce your activity under these circumstances until the bleeding has stopped.  BOWELS: It is important to keep your bowels regular during the postoperative period. Straining with bowel movements can cause bleeding. A bowel movement every other day is reasonable. Use a mild laxative if needed, such as Milk of Magnesia 2-3 tablespoons, or 2 Dulcolax tablets. Call if you continue to have problems. If you have been taking narcotics for pain, before, during or after your surgery, you may be constipated. Take a laxative if necessary.   MEDICATION: You should resume your pre-surgery medications unless told not to. In addition you will often be given an antibiotic to prevent infection. These should be taken as prescribed until the bottles are finished unless you are having an unusual   reaction to one of the drugs.  PROBLEMS YOU SHOULD REPORT TO US: Fevers over 100.5 Fahrenheit. Heavy bleeding, or clots ( See above notes about blood in urine ). Inability to urinate. Drug reactions ( hives, rash, nausea, vomiting, diarrhea ). Severe burning or pain with urination that is not improving.  FOLLOW-UP: You will need a  follow-up appointment to monitor your progress. Call for this appointment at the number listed above. Usually the first appointment will be about three to fourteen days after your surgery.        

## 2022-12-03 NOTE — Anesthesia Procedure Notes (Signed)
Procedure Name: LMA Insertion Date/Time: 12/03/2022 2:11 PM  Performed by: Georgeanne Nim, CRNAPre-anesthesia Checklist: Patient identified, Emergency Drugs available, Suction available, Patient being monitored and Timeout performed Patient Re-evaluated:Patient Re-evaluated prior to induction Oxygen Delivery Method: Circle system utilized Preoxygenation: Pre-oxygenation with 100% oxygen Induction Type: IV induction Ventilation: Mask ventilation without difficulty LMA: LMA inserted LMA Size: 4.0 Number of attempts: 1 Placement Confirmation: positive ETCO2 and breath sounds checked- equal and bilateral Tube secured with: Tape Dental Injury: Teeth and Oropharynx as per pre-operative assessment

## 2022-12-03 NOTE — Assessment & Plan Note (Signed)
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 -Baseline CA 19-9 elevated to 273 on 07/03/21 -He began first-line mFOLFIRINOX on 08/13/21, but required dose reduction and move to every 3 weeks. -his CA 19-9 trended up but is now stable. -restaging CT CAP 08/12/22 showed: multiple new and enlarging pulmonary metastases; pancreatic mass grossly stable. Will repeat in 3 months. -we changed his irinotecan to liposomal on 09/05/22. He tolerated better and feels stronger. -He did not tolerated Lip-irrinotecan at 60 mg/m2 dose at C2, will reduce back to '50mg'$ /m2 from cycle 3  -restaging CT from November 29, 2022 showed slightly worsening pulmonary metastasis, and enlarged retroperitoneal lymph node.  His tumor marker CA 19.9 has been trending up the past few months. -I discussed the CT scan findings with patient and his wife, I recommended changing his treatment to second-line gemcitabine and Abraxane.

## 2022-12-03 NOTE — Interval H&P Note (Signed)
History and Physical Interval Note:  12/03/2022 12:28 PM  Zachary Reid  has presented today for surgery, with the diagnosis of LEFT URETERAL STONE.  The various methods of treatment have been discussed with the patient and family. After consideration of risks, benefits and other options for treatment, the patient has consented to  Procedure(s) with comments: CYSTOSCOPY/LEFT URETEROSCOPY/HOLMIUM LASER/STENT EXCHANGE (Left) - 1 HR FOR CASE as a surgical intervention.  The patient's history has been reviewed, patient examined, no change in status, stable for surgery.  I have reviewed the patient's chart and labs.  Questions were answered to the patient's satisfaction.     [name]

## 2022-12-03 NOTE — Anesthesia Postprocedure Evaluation (Signed)
Anesthesia Post Note  Patient: Zachary Reid  Procedure(s) Performed: CYSTOSCOPY/LEFT URETEROSCOPY/HOLMIUM LASER/STENT EXCHANGE (Left: Ureter)     Patient location during evaluation: PACU Anesthesia Type: General Level of consciousness: awake and alert Pain management: pain level controlled Vital Signs Assessment: post-procedure vital signs reviewed and stable Respiratory status: spontaneous breathing, nonlabored ventilation, respiratory function stable and patient connected to nasal cannula oxygen Cardiovascular status: blood pressure returned to baseline and stable Postop Assessment: no apparent nausea or vomiting Anesthetic complications: no  No notable events documented.  Last Vitals:  Vitals:   12/03/22 1600 12/03/22 1615  BP: 121/67 114/61  Pulse: 75 79  Resp: 17 20  Temp:    SpO2: 96% 100%    Last Pain:  Vitals:   12/03/22 1551  TempSrc:   PainSc: 0-No pain   Pain Goal: Patients Stated Pain Goal: 4 (12/03/22 1126)                 [place]

## 2022-12-04 ENCOUNTER — Inpatient Hospital Stay: Payer: Medicare Other

## 2022-12-04 ENCOUNTER — Encounter (HOSPITAL_BASED_OUTPATIENT_CLINIC_OR_DEPARTMENT_OTHER): Payer: Self-pay | Admitting: Urology

## 2022-12-04 ENCOUNTER — Inpatient Hospital Stay (HOSPITAL_BASED_OUTPATIENT_CLINIC_OR_DEPARTMENT_OTHER): Payer: Medicare Other | Admitting: Hematology

## 2022-12-04 ENCOUNTER — Other Ambulatory Visit: Payer: Self-pay

## 2022-12-04 ENCOUNTER — Inpatient Hospital Stay: Payer: Medicare Other | Attending: Hematology

## 2022-12-04 VITALS — BP 119/65 | HR 74 | Temp 98.0°F | Resp 18 | Ht 71.0 in | Wt 185.5 lb

## 2022-12-04 DIAGNOSIS — Z79899 Other long term (current) drug therapy: Secondary | ICD-10-CM | POA: Insufficient documentation

## 2022-12-04 DIAGNOSIS — E119 Type 2 diabetes mellitus without complications: Secondary | ICD-10-CM | POA: Insufficient documentation

## 2022-12-04 DIAGNOSIS — Z5111 Encounter for antineoplastic chemotherapy: Secondary | ICD-10-CM | POA: Insufficient documentation

## 2022-12-04 DIAGNOSIS — C259 Malignant neoplasm of pancreas, unspecified: Secondary | ICD-10-CM

## 2022-12-04 DIAGNOSIS — I82890 Acute embolism and thrombosis of other specified veins: Secondary | ICD-10-CM | POA: Diagnosis not present

## 2022-12-04 DIAGNOSIS — Z95828 Presence of other vascular implants and grafts: Secondary | ICD-10-CM

## 2022-12-04 DIAGNOSIS — K7689 Other specified diseases of liver: Secondary | ICD-10-CM | POA: Diagnosis not present

## 2022-12-04 DIAGNOSIS — I2510 Atherosclerotic heart disease of native coronary artery without angina pectoris: Secondary | ICD-10-CM | POA: Diagnosis not present

## 2022-12-04 DIAGNOSIS — Z794 Long term (current) use of insulin: Secondary | ICD-10-CM | POA: Diagnosis not present

## 2022-12-04 DIAGNOSIS — C250 Malignant neoplasm of head of pancreas: Secondary | ICD-10-CM | POA: Diagnosis not present

## 2022-12-04 DIAGNOSIS — Z7901 Long term (current) use of anticoagulants: Secondary | ICD-10-CM | POA: Insufficient documentation

## 2022-12-04 DIAGNOSIS — Z86010 Personal history of colonic polyps: Secondary | ICD-10-CM | POA: Insufficient documentation

## 2022-12-04 DIAGNOSIS — E785 Hyperlipidemia, unspecified: Secondary | ICD-10-CM | POA: Insufficient documentation

## 2022-12-04 DIAGNOSIS — E559 Vitamin D deficiency, unspecified: Secondary | ICD-10-CM | POA: Diagnosis not present

## 2022-12-04 DIAGNOSIS — C7800 Secondary malignant neoplasm of unspecified lung: Secondary | ICD-10-CM | POA: Insufficient documentation

## 2022-12-04 DIAGNOSIS — I700 Atherosclerosis of aorta: Secondary | ICD-10-CM | POA: Diagnosis not present

## 2022-12-04 DIAGNOSIS — I480 Paroxysmal atrial fibrillation: Secondary | ICD-10-CM | POA: Insufficient documentation

## 2022-12-04 DIAGNOSIS — E1122 Type 2 diabetes mellitus with diabetic chronic kidney disease: Secondary | ICD-10-CM

## 2022-12-04 DIAGNOSIS — N132 Hydronephrosis with renal and ureteral calculous obstruction: Secondary | ICD-10-CM | POA: Diagnosis not present

## 2022-12-04 DIAGNOSIS — E860 Dehydration: Secondary | ICD-10-CM

## 2022-12-04 LAB — CBC WITH DIFFERENTIAL (CANCER CENTER ONLY)
Abs Immature Granulocytes: 0.01 10*3/uL (ref 0.00–0.07)
Basophils Absolute: 0 10*3/uL (ref 0.0–0.1)
Basophils Relative: 0 %
Eosinophils Absolute: 0.1 10*3/uL (ref 0.0–0.5)
Eosinophils Relative: 2 %
HCT: 25.3 % — ABNORMAL LOW (ref 39.0–52.0)
Hemoglobin: 9 g/dL — ABNORMAL LOW (ref 13.0–17.0)
Immature Granulocytes: 0 %
Lymphocytes Relative: 28 %
Lymphs Abs: 1.3 10*3/uL (ref 0.7–4.0)
MCH: 35.4 pg — ABNORMAL HIGH (ref 26.0–34.0)
MCHC: 35.6 g/dL (ref 30.0–36.0)
MCV: 99.6 fL (ref 80.0–100.0)
Monocytes Absolute: 0.6 10*3/uL (ref 0.1–1.0)
Monocytes Relative: 14 %
Neutro Abs: 2.6 10*3/uL (ref 1.7–7.7)
Neutrophils Relative %: 56 %
Platelet Count: 118 10*3/uL — ABNORMAL LOW (ref 150–400)
RBC: 2.54 MIL/uL — ABNORMAL LOW (ref 4.22–5.81)
RDW: 14 % (ref 11.5–15.5)
WBC Count: 4.7 10*3/uL (ref 4.0–10.5)
nRBC: 0 % (ref 0.0–0.2)

## 2022-12-04 LAB — POCT I-STAT, CHEM 8
BUN: 13 mg/dL (ref 8–23)
Calcium, Ion: 1.26 mmol/L (ref 1.15–1.40)
Chloride: 104 mmol/L (ref 98–111)
Creatinine, Ser: 0.8 mg/dL (ref 0.61–1.24)
Glucose, Bld: 175 mg/dL — ABNORMAL HIGH (ref 70–99)
HCT: 29 % — ABNORMAL LOW (ref 39.0–52.0)
Hemoglobin: 9.9 g/dL — ABNORMAL LOW (ref 13.0–17.0)
Potassium: 4.4 mmol/L (ref 3.5–5.1)
Sodium: 140 mmol/L (ref 135–145)
TCO2: 26 mmol/L (ref 22–32)

## 2022-12-04 LAB — CMP (CANCER CENTER ONLY)
ALT: 17 U/L (ref 0–44)
AST: 17 U/L (ref 15–41)
Albumin: 3.5 g/dL (ref 3.5–5.0)
Alkaline Phosphatase: 97 U/L (ref 38–126)
Anion gap: 5 (ref 5–15)
BUN: 14 mg/dL (ref 8–23)
CO2: 27 mmol/L (ref 22–32)
Calcium: 9 mg/dL (ref 8.9–10.3)
Chloride: 104 mmol/L (ref 98–111)
Creatinine: 0.93 mg/dL (ref 0.61–1.24)
GFR, Estimated: >60 mL/min (ref >60)
Glucose, Bld: 286 mg/dL — ABNORMAL HIGH (ref 70–99)
Potassium: 3.9 mmol/L (ref 3.5–5.1)
Sodium: 136 mmol/L (ref 135–145)
Total Bilirubin: 0.4 mg/dL (ref 0.3–1.2)
Total Protein: 5.9 g/dL — ABNORMAL LOW (ref 6.5–8.1)

## 2022-12-04 MED ORDER — ALPRAZOLAM 0.25 MG PO TABS: 0.2500 mg | ORAL_TABLET | Freq: Two times a day (BID) | ORAL | 0 refills | Status: DC | PRN

## 2022-12-04 MED ORDER — SODIUM CHLORIDE 0.9% FLUSH
10.0000 mL | Freq: Once | INTRAVENOUS | Status: AC
  Administered 2022-12-04: 10 mL

## 2022-12-04 MED ORDER — LEUCOVORIN CALCIUM INJECTION 350 MG
400.0000 mg/m2 | Freq: Once | INTRAVENOUS | Status: AC
  Administered 2022-12-04: 812 mg via INTRAVENOUS
  Filled 2022-12-04: qty 40.6

## 2022-12-04 MED ORDER — DEXTROSE 5 % IV SOLN: Freq: Once | INTRAVENOUS | Status: AC

## 2022-12-04 MED ORDER — PROCHLORPERAZINE EDISYLATE 10 MG/2ML IJ SOLN
10.0000 mg | Freq: Once | INTRAMUSCULAR | Status: AC
  Administered 2022-12-04: 10 mg via INTRAVENOUS
  Filled 2022-12-04: qty 2

## 2022-12-04 MED ORDER — TRAMADOL HCL 50 MG PO TABS: 50.0000 mg | ORAL_TABLET | Freq: Four times a day (QID) | ORAL | 0 refills | Status: DC | PRN

## 2022-12-04 MED ORDER — PALONOSETRON HCL INJECTION 0.25 MG/5ML
0.2500 mg | Freq: Once | INTRAVENOUS | Status: AC
  Administered 2022-12-04: 0.25 mg via INTRAVENOUS
  Filled 2022-12-04: qty 5

## 2022-12-04 MED ORDER — SODIUM CHLORIDE 0.9 % IV SOLN
10.0000 mg | Freq: Once | INTRAVENOUS | Status: AC
  Administered 2022-12-04: 10 mg via INTRAVENOUS
  Filled 2022-12-04: qty 10

## 2022-12-04 MED ORDER — PROCHLORPERAZINE MALEATE 10 MG PO TABS: 10.0000 mg | ORAL_TABLET | Freq: Four times a day (QID) | ORAL | 1 refills | Status: DC | PRN

## 2022-12-04 MED ORDER — SODIUM CHLORIDE 0.9 % IV SOLN
150.0000 mg | Freq: Once | INTRAVENOUS | Status: AC
  Administered 2022-12-04: 150 mg via INTRAVENOUS
  Filled 2022-12-04: qty 150

## 2022-12-04 MED ORDER — SODIUM CHLORIDE 0.9 % IV SOLN
50.0000 mg/m2 | Freq: Once | INTRAVENOUS | Status: AC
  Administered 2022-12-04: 103.2 mg via INTRAVENOUS
  Filled 2022-12-04: qty 24

## 2022-12-04 MED ORDER — OXALIPLATIN CHEMO INJECTION 100 MG/20ML
30.0000 mg/m2 | Freq: Once | INTRAVENOUS | Status: AC
  Administered 2022-12-04: 60 mg via INTRAVENOUS
  Filled 2022-12-04: qty 12

## 2022-12-04 MED ORDER — SODIUM CHLORIDE 0.9% FLUSH: 10.0000 mL | INTRAVENOUS | Status: DC | PRN

## 2022-12-04 MED ORDER — SODIUM CHLORIDE 0.9 % IV SOLN
1800.0000 mg/m2 | INTRAVENOUS | Status: DC
  Administered 2022-12-04: 3650 mg via INTRAVENOUS
  Filled 2022-12-04: qty 73

## 2022-12-04 MED ORDER — ZOLPIDEM TARTRATE 10 MG PO TABS: 10.0000 mg | ORAL_TABLET | Freq: Every evening | ORAL | 0 refills | Status: DC | PRN

## 2022-12-05 ENCOUNTER — Other Ambulatory Visit: Payer: Self-pay

## 2022-12-05 LAB — CANCER ANTIGEN 19-9: CA 19-9: 219 U/mL — ABNORMAL HIGH (ref 0–35)

## 2022-12-06 ENCOUNTER — Inpatient Hospital Stay: Payer: Medicare Other

## 2022-12-06 VITALS — BP 106/69 | HR 86 | Temp 98.1°F | Resp 18

## 2022-12-06 DIAGNOSIS — C259 Malignant neoplasm of pancreas, unspecified: Secondary | ICD-10-CM

## 2022-12-06 DIAGNOSIS — Z5111 Encounter for antineoplastic chemotherapy: Secondary | ICD-10-CM | POA: Diagnosis not present

## 2022-12-06 MED ORDER — SODIUM CHLORIDE 0.9% FLUSH
10.0000 mL | INTRAVENOUS | Status: DC | PRN
  Administered 2022-12-06: 10 mL

## 2022-12-06 MED ORDER — HEPARIN SOD (PORK) LOCK FLUSH 100 UNIT/ML IV SOLN
500.0000 [IU] | Freq: Once | INTRAVENOUS | Status: AC | PRN
  Administered 2022-12-06: 500 [IU]

## 2022-12-09 ENCOUNTER — Encounter: Payer: Self-pay | Admitting: Hematology

## 2022-12-09 NOTE — Progress Notes (Signed)
DISCONTINUE ON PATHWAY REGIMEN - Pancreatic Adenocarcinoma     A cycle is every 14 days:     Oxaliplatin      Leucovorin      Irinotecan      Fluorouracil   **Always confirm dose/schedule in your pharmacy ordering system**  REASON: Disease Progression PRIOR TREATMENT: [id number]: mFOLFIRINOX q14 Days Until Progression or Toxicity TREATMENT RESPONSE: Partial Response (PR)  START ON PATHWAY REGIMEN - Pancreatic Adenocarcinoma     A cycle is every 28 days:     Nab-paclitaxel (protein bound)      Gemcitabine   **Always confirm dose/schedule in your pharmacy ordering system**  Patient Characteristics: Metastatic Disease, Second Line, MSS/pMMR or MSI Unknown, Fluoropyrimidine-Based Therapy First Line Therapeutic Status: Metastatic Disease Line of Therapy: Second Line Microsatellite/Mismatch Repair Status: MSS/pMMR Intent of Therapy: Non-Curative / Palliative Intent, Discussed with Patient 

## 2022-12-12 NOTE — Progress Notes (Signed)
[place]   [phone] [phone]   Clinic Follow up Note   Patient Care Team: [name], MD as PCP - General (Internal Medicine) [name], NP as PCP - Hematology/Oncology (Nurse Practitioner) [name], DO as PCP - Cardiology (Cardiology) [name], MD as Consulting Physician (Orthopedic Surgery) [name], MD as Consulting Physician (Cardiology) [name], MD as Consulting Physician (Oncology)  Date of Service:  12/13/2022  I connected with Zachary Reid on 12/13/2022 at 10:00 AM EST by telephone visit and verified that I am speaking with the correct person using two identifiers.  I discussed the limitations, risks, security and privacy concerns of performing an evaluation and management service by telephone and the availability of in person appointments. I also discussed with the patient that there may be a patient responsible charge related to this service. The patient expressed understanding and agreed to proceed.   Other persons participating in the visit and their role in the encounter:    Patient's location:  Home Provider's location:  Office  CHIEF COMPLAINT: f/u of  metastatic pancreatic cancer       CURRENT THERAPY:  First line FOLFIRINOX, starting 08/13/21, now q3weeks              -irinotecan switched to liposomal 09/26/22  ASSESSMENT & PLAN:  Zachary Reid is a 67 y.o. male with   Pancreatic adenocarcinoma ([place]) [phone] with numerous small cavitary lesions in bilateral lungs, MMR normal  -diagnosed 07/2021, after incidental finding on CT scan for kidney stone, by colonoscopy/EUS. -Baseline CA 19-9 elevated to 273 on 07/03/21 -He began first-line mFOLFIRINOX on 08/13/21, but required dose reduction and move to every 3 weeks. -his CA 19-9 trended up but is now stable. -restaging CT CAP 08/12/22 showed: multiple new and enlarging pulmonary metastases; pancreatic mass grossly stable. Will repeat in 3  months. -we changed his irinotecan to liposomal on 09/05/22. He tolerated better and feels stronger. -He did not tolerated Lip-irrinotecan at 60 mg/m2 dose at C2, will reduce back to '50mg'$ /m2 from cycle 3  -restaging CT from November 29, 2022 showed slightly worsening pulmonary metastasis, and enlarged retroperitoneal lymph node.  Compared to his previous CT 6 months ago, the changes are significant and meet care for cancer progression.  His tumor marker CA 19.9 has been trending up the past few months.  -His recent CT scan showed disease progression in his lung, I recommended changing treatment to gemcitabine and Abraxane -Patient again had multiple questions about his cancer status, treatment options and potential side effect from gemcitabine and Abraxane, I answered to his satisfaction.  Plan - Postpone first cycle of gemcitabine and Abraxane for 1 week from original date per his request, due to his travel -Follow-up on February 15 before C1   SUMMARY OF ONCOLOGIC HISTORY: Oncology History Overview Note   Cancer Staging  Pancreatic adenocarcinoma [facility]) Staging form: Exocrine Pancreas, AJCC 8th Edition - Clinical stage from 07/26/2021: Stage IV (cT4, cN0, cM1) - Signed by [name], MD on 07/28/2021    Pancreatic adenocarcinoma ([place])  07/02/2021 Initial Diagnosis   Pancreatic adenocarcinoma ([place])   07/26/2021 Cancer Staging   Staging form: Exocrine Pancreas, AJCC 8th Edition - Clinical stage from 07/26/2021: Stage IV (cT4, cN0, cM1) - Signed by [name], MD on 07/28/2021 Stage prefix: Initial diagnosis Total positive nodes: 0   08/13/2021 - 07/06/2022 Chemotherapy   Patient is on Treatment Plan : PANCREAS Modified FOLFIRINOX q14d x 4 cycles  Genetic Testing   [facility] CancerNext-Expanded results (77 genes) were negative. No pathogenic variants were identified. 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 The report date is 08/15/2021.    The  CancerNext-Expanded gene panel offered by [facility] and includes sequencing, rearrangement, and RNA analysis for the following 77 genes: AIP, ALK, APC, ATM, AXIN2, BAP1, BARD1, BLM, BMPR1A, BRCA1, BRCA2, BRIP1, CDC73, CDH1, CDK4, CDKN1B, CDKN2A, CHEK2, CTNNA1, DICER1, FANCC, FH, FLCN, GALNT12, KIF1B, LZTR1, MAX, MEN1, MET, MLH1, MSH2, MSH3, MSH6, MUTYH, NBN, NF1, NF2, NTHL1, PALB2, PHOX2B, PMS2, POT1, PRKAR1A, PTCH1, PTEN, RAD51C, RAD51D, RB1, RECQL, RET, SDHA, SDHAF2, SDHB, SDHC, SDHD, SMAD4, SMARCA4, SMARCB1, SMARCE1, STK11, SUFU, TMEM127, TP53, TSC1, TSC2, VHL and XRCC2 (sequencing and deletion/duplication); EGFR, EGLN1, HOXB13, KIT, MITF, PDGFRA, POLD1, and POLE (sequencing only); EPCAM and GREM1 (deletion/duplication only).     08/13/2021 - 12/06/2022 Chemotherapy   Patient is on Treatment Plan : PANCREAS Modified FOLFIRINOX q14d x 4 cycles     10/25/2021 Imaging   EXAM: CT CHEST, ABDOMEN, AND PELVIS WITH CONTRAST  IMPRESSION: 1. Innumerable bilateral pulmonary nodules, many of which are cavitary, consistent with metastatic disease. These nodules show no substantial change in are minimally progressed in the interval. 2. Mix cystic and solid lesion in the head and body of the pancreas is similar to prior and also comparing back to MRI 07/02/2021. 3. Hepatic cysts. 4. 8 mm nonobstructing left renal stone. 5. Aortic Atherosclerosis (ICD10-I70.0).   01/28/2022 Imaging   EXAM: CT CHEST, ABDOMEN, AND PELVIS WITH CONTRAST  IMPRESSION: 1. Innumerable bilateral small solid and cavitary pulmonary nodules, some of the cavitary nodules are slightly less thick-walled when compared with prior exam. 2. Mixed cystic and solid lesion in the head and body of the pancreas is similar to prior exam. 3. Nonobstructing left renal stone. 4.  Aortic Atherosclerosis (ICD10-I70.0).   11/29/2022 Imaging    IMPRESSION: 1. No significant change in primary pancreatic mass and adjacent cystic component.  Unchanged appearance of soft tissue extending to involve the adjacent celiac axis, superior mesenteric artery origin, and portal confluence. 2. 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 3. Innumerable small pulmonary nodules throughout the lungs, the majority of which are cavitary. Although interval change is difficult to appreciate for the majority of these nodules due to small size, at least some of these are slightly enlarged, consistent with worsened pulmonary metastatic disease. 4. Newly enlarged left retroperitoneal lymph nodes, which may reflect nodal metastatic disease or perhaps reactive to left hydronephrosis. Attention on follow-up. 5. A sizable calculus previously seen in the left renal pelvis has migrated to the middle third of the left ureter, with placement of a double-J left ureteral stent, with formed pigtails in the left renal pelvis and urinary bladder. Moderate left hydronephrosis and proximal hydroureter. 6. Coronary artery disease.   Aortic Atherosclerosis (ICD10-I70.0).   01/02/2023 -  Chemotherapy   Patient is on Treatment Plan : PANCREATIC Abraxane D1,8,15 + Gemcitabine D1,8,15 q28d        INTERVAL HISTORY:  Zachary Reid was contacted for a follow up of  metastatic pancreatic cancer. he was last seen by me on 12/04/2022. Pt had additional questions regarding his cancer status and treatment after last visit, and that he requested a phone visit with me before his next treatment.  He tolerated last treatment well overall, mild to moderate fatigue  and pt is having shortness of breath due to treatment, which is  not new.     All other systems were reviewed with the patient and are negative.  MEDICAL HISTORY:  Past Medical History:  Diagnosis Date   Adenocarcinoma of pancreas, stage 4 ([place]) 07/2021   oncologist--- dr Burr Medico;   mets to  bilateral lung;  started chemo 08-13-2021   Anemia     GAD (generalized anxiety disorder)    History of adenomatous polyp of colon    Hyperlipidemia    Hypogonadism male    IBS (irritable bowel syndrome)    Insomnia    Left ureteral calculus    Metastatic cancer to lung ([place]) 07/2021   primary pancreatic cancer w/ numerous small cavity lesions bilateral lungs   PAF (paroxysmal atrial fibrillation) ([place]) 10/31/2022   cardiologist-- dr Raliegh Ip. tobb;  newly dx ED 10-31-2022 w/ RVR  in setting flank pain (kidney stone)  --  office note in epic 11-07-2022 normal ETT 11-13-2022, echo 11-07-2022 ef 65-70% w/ mild LVH, mild MR,  zio monitor not completed,  started on toprol and xarelto daily   Personal history of chemotherapy    Receiving chemotherapy for metastatic pancreatic cancer. Most recent infusion (as of 12/02/22) was on 11/15/22.   Port-A-Cath in place 08/10/2021   Type 2 diabetes mellitus treated with insulin [facility])    endocrinologist--- dr Dwyane Dee   Vitamin D deficiency    takes Vit D supplements   Wears contact lenses     SURGICAL HISTORY: Past Surgical History:  Procedure Laterality Date   APPENDECTOMY  1988   BIOPSY  07/26/2021   Procedure: BIOPSY;  Surgeon: Irving Copas., MD;  Location: [place];  Service: Gastroenterology;;   COLONOSCOPY WITH PROPOFOL N/A 07/26/2021   Procedure: COLONOSCOPY WITH PROPOFOL;  Surgeon: Irving Copas., MD;  Location: [place];  Service: Gastroenterology;  Laterality: N/A;   CYSTOSCOPY WITH STENT PLACEMENT Left 10/31/2022   Procedure: CYSTOSCOPY WITH STENT PLACEMENT;  Surgeon: [name], MD;  Location: [facility];  Service: Urology;  Laterality: Left;   CYSTOSCOPY/URETEROSCOPY/HOLMIUM LASER/STENT PLACEMENT Left 12/03/2022   Procedure: CYSTOSCOPY/LEFT URETEROSCOPY/HOLMIUM LASER/STENT EXCHANGE;  Surgeon: [name], MD;  Location[facility];  Service: Urology;  Laterality: Left;  1 HR FOR CASE   ESOPHAGOGASTRODUODENOSCOPY (EGD) WITH PROPOFOL N/A 07/26/2021    Procedure: ESOPHAGOGASTRODUODENOSCOPY (EGD) WITH PROPOFOL;  Surgeon: Rush Landmark Telford Nab., MD;  Location: [place];  Service: Gastroenterology;  Laterality: N/A;   EUS N/A 07/26/2021   Procedure: UPPER ENDOSCOPIC ULTRASOUND (EUS) RADIAL;  Surgeon: Irving Copas., MD;  Location: [place];  Service: Gastroenterology;  Laterality: N/A;   FINE NEEDLE ASPIRATION  07/26/2021   Procedure: FINE NEEDLE ASPIRATION (FNA) LINEAR;  Surgeon: Rush Landmark Telford Nab., MD;  Location: [place];  Service: Gastroenterology;;   IR IMAGING GUIDED PORT INSERTION  08/10/2021   POLYPECTOMY  07/26/2021   Procedure: POLYPECTOMY;  Surgeon: Rush Landmark Telford Nab., MD;  Location: [place];  Service: Gastroenterology;;   SHOULDER SURGERY Right    as a teenager    I have reviewed the social history and family history with the patient and they are unchanged from previous note.  ALLERGIES:  is allergic to lipitor [atorvastatin].  MEDICATIONS:  Current Outpatient Medications  Medication Sig Dispense Refill   ALPRAZolam (XANAX) 0.25 MG tablet Take 1-2 tablets (0.25-0.5 mg total) by mouth 2 (two) times daily as needed for anxiety. 60 tablet 0   Cholecalciferol (VITAMIN D3) 125 MCG (5000 UT) CAPS Take 5,000 Units by mouth in the morning and at bedtime.  Continuous Blood Gluc Sensor (DEXCOM G7 SENSOR) MISC 1 Device by Does not apply route as directed. Change sensor every 10 days (Patient taking differently: 1 Device by Does not apply route as directed. Change sensor every 10 days Per patient on 12/02/22, he has not yet received glucose monitor.) 3 each 3   gabapentin (NEURONTIN) 600 MG tablet Take 1 tablet (600 mg total) by mouth 3 (three) times daily. (Patient taking differently: Take 600 mg by mouth 3 (three) times daily as needed (pain).) 90 tablet 1   glucose blood test strip Use as instructed 100 each 12   HYDROcodone-acetaminophen (NORCO/VICODIN) 5-325 MG tablet Take 1 tablet by mouth every  4 (four) hours as needed for moderate pain. (Patient not taking: Reported on 12/02/2022) 20 tablet 0   hyoscyamine (LEVSIN SL) 0.125 MG SL tablet DISSOLVE ONE TABLET UNDER THE TONGUE 4 TIMES DAILY UP TO EVERY 4 HOURS AS NEEDED FOR NAUSEA, BLOATING, CRAMPING, OR DIARRHEA (Patient not taking: Reported on 12/02/2022) 100 tablet 0   insulin degludec (TRESIBA FLEXTOUCH) 100 UNIT/ML FlexTouch Pen Inject 30 Units into the skin daily.     insulin lispro (HUMALOG KWIKPEN) 100 UNIT/ML KwikPen 10 TO 15 UNITS BEFORE MEALS AS DIRECTED (Patient taking differently: Inject 10-12 Units into the skin 3 (three) times daily before meals.) 15 mL 1   metoprolol succinate (TOPROL XL) 25 MG 24 hr tablet Take 0.5 tablets (12.5 mg total) by mouth daily. (Patient taking differently: Take 12.5 mg by mouth daily. Per patient, he will start taking Metoprolol on 12/02/22 and take again on the day of surgery (12/03/22.)) 45 tablet 3   oxyCODONE (ROXICODONE) 5 MG immediate release tablet Take 1 tablet (5 mg total) by mouth every 6 (six) hours as needed for up to 30 doses for severe pain. 30 tablet 0   prochlorperazine (COMPAZINE) 10 MG tablet Take 1 tablet (10 mg total) by mouth every 6 (six) hours as needed (Nausea or vomiting). 60 tablet 1   rivaroxaban (XARELTO) 20 MG TABS tablet Take 1 tablet (20 mg total) by mouth daily with supper. (Patient taking differently: Take 20 mg by mouth daily with supper. Patient stated that he last took Xarelto on 11/30/22.) 90 tablet 3   traMADol (ULTRAM) 50 MG tablet Take 1-2 tablets (50-100 mg total) by mouth every 6 (six) hours as needed. 120 tablet 0   zolpidem (AMBIEN) 10 MG tablet Take 1 tablet (10 mg total) by mouth at bedtime as needed for sleep. 30 tablet 0   No current facility-administered medications for this visit.    PHYSICAL EXAMINATION: ECOG PERFORMANCE STATUS: 1 - Symptomatic but completely ambulatory  There were no vitals filed for this visit. Wt Readings from Last 3 Encounters:   12/04/22 185 lb 8 oz (84.1 kg)  12/03/22 183 lb 14.4 oz (83.4 kg)  11/19/22 178 lb 12.8 oz (81.1 kg)     No vitals taken today, Exam not performed today  LABORATORY DATA:  I have reviewed the data as listed    Latest Ref Rng & Units 12/04/2022    9:22 AM 12/03/2022   11:43 AM 11/13/2022    9:03 AM  CBC  WBC 4.0 - 10.5 K/uL 4.7   6.6   Hemoglobin 13.0 - 17.0 g/dL 9.0  9.9  11.0   Hematocrit 39.0 - 52.0 % 25.3  29.0  30.9   Platelets 150 - 400 K/uL 118   96         Latest Ref Rng & Units 12/04/2022  9:22 AM 12/03/2022   11:43 AM 11/13/2022    9:03 AM  CMP  Glucose 70 - 99 mg/dL 286  175  247   BUN 8 - 23 mg/dL '14  13  14   '$ Creatinine 0.61 - 1.24 mg/dL 0.93  0.80  0.97   Sodium 135 - 145 mmol/L 136  140  139   Potassium 3.5 - 5.1 mmol/L 3.9  4.4  3.5   Chloride 98 - 111 mmol/L 104  104  106   CO2 22 - 32 mmol/L 27   28   Calcium 8.9 - 10.3 mg/dL 9.0   9.0   Total Protein 6.5 - 8.1 g/dL 5.9   6.6   Total Bilirubin 0.3 - 1.2 mg/dL 0.4   0.4   Alkaline Phos 38 - 126 U/L 97   92   AST 15 - 41 U/L 17   21   ALT 0 - 44 U/L 17   21       RADIOGRAPHIC STUDIES: I have personally reviewed the radiological images as listed and agreed with the findings in the report. No results found.    Orders Placed This Encounter  Procedures   CBC with Differential ([place] Only)    Standing Status:   Future    Standing Expiration Date:   01/31/2024   CMP ([place] only)    Standing Status:   Future    Standing Expiration Date:   01/31/2024   CBC with Differential ([place] Only)    Standing Status:   Future    Standing Expiration Date:   02/14/2024   CMP ([place] only)    Standing Status:   Future    Standing Expiration Date:   02/14/2024   All questions were answered. The patient knows to call the clinic with any problems, questions or concerns. No barriers to learning was detected. The total time spent in the appointment was 12 minutes.     [name],  MD 12/13/2022   I, Maurine Simmering, am acting as scribe for [name], MD.   I have reviewed the above documentation for accuracy and completeness, and I agree with the above.

## 2022-12-13 ENCOUNTER — Inpatient Hospital Stay (HOSPITAL_BASED_OUTPATIENT_CLINIC_OR_DEPARTMENT_OTHER): Payer: Medicare Other | Admitting: Hematology

## 2022-12-13 ENCOUNTER — Encounter: Payer: Self-pay | Admitting: Hematology

## 2022-12-13 DIAGNOSIS — C259 Malignant neoplasm of pancreas, unspecified: Secondary | ICD-10-CM

## 2022-12-13 NOTE — Assessment & Plan Note (Signed)
[id number] with numerous small cavitary lesions in bilateral lungs, MMR normal  -diagnosed 07/2021, after incidental finding on CT scan for kidney stone, by colonoscopy/EUS. -Baseline CA 19-9 elevated to 273 on 07/03/21 -He began first-line mFOLFIRINOX on 08/13/21, but required dose reduction and move to every 3 weeks. -his CA 19-9 trended up but is now stable. -restaging CT CAP 08/12/22 showed: multiple new and enlarging pulmonary metastases; pancreatic mass grossly stable. Will repeat in 3 months. -we changed his irinotecan to liposomal on 09/05/22. He tolerated better and feels stronger. -He did not tolerated Lip-irrinotecan at 60 mg/m2 dose at C2, will reduce back to '50mg'$ /m2 from cycle 3  -restaging CT from November 29, 2022 showed slightly worsening pulmonary metastasis, and enlarged retroperitoneal lymph node.  Compared to his previous CT 6 months ago, the changes are significant and meet care for cancer progression.  His tumor marker CA 19.9 has been trending up the past few months.  -His recent CT scan showed disease progression in his lung, I recommended changing treatment to gemcitabine and Abraxane

## 2022-12-16 ENCOUNTER — Telehealth: Payer: Self-pay | Admitting: Hematology

## 2022-12-16 NOTE — Telephone Encounter (Signed)
Left patient a vm regarding upcoming appointment  

## 2022-12-24 ENCOUNTER — Other Ambulatory Visit: Payer: Self-pay | Admitting: Endocrinology

## 2022-12-25 ENCOUNTER — Other Ambulatory Visit: Payer: Self-pay

## 2022-12-26 ENCOUNTER — Inpatient Hospital Stay: Payer: Medicare Other

## 2022-12-26 ENCOUNTER — Inpatient Hospital Stay: Payer: Medicare Other | Admitting: Nurse Practitioner

## 2022-12-31 NOTE — Progress Notes (Signed)
Pharmacist Chemotherapy Monitoring - Initial Assessment    Anticipated start date: 01/02/23   The following has been reviewed per standard work regarding the patient's treatment regimen: The patient's diagnosis, treatment plan and drug doses, and organ/hematologic function Lab orders and baseline tests specific to treatment regimen  The treatment plan start date, drug sequencing, and pre-medications Prior authorization status  Patient's documented medication list, including drug-drug interaction screen and prescriptions for anti-emetics and supportive care specific to the treatment regimen The drug concentrations, fluid compatibility, administration routes, and timing of the medications to be used The patient's access for treatment and lifetime cumulative dose history, if applicable  The patient's medication allergies and previous infusion related reactions, if applicable   Changes made to treatment plan:  N/A  Follow up needed:  N/A   Acquanetta Belling, RPH, BCPS, BCOP 12/31/2022  8:16 AM

## 2023-01-02 ENCOUNTER — Inpatient Hospital Stay: Payer: Medicare Other

## 2023-01-02 ENCOUNTER — Encounter: Payer: Self-pay | Admitting: Hematology

## 2023-01-02 ENCOUNTER — Other Ambulatory Visit: Payer: Self-pay

## 2023-01-02 ENCOUNTER — Inpatient Hospital Stay: Payer: Medicare Other | Attending: Hematology

## 2023-01-02 ENCOUNTER — Inpatient Hospital Stay (HOSPITAL_BASED_OUTPATIENT_CLINIC_OR_DEPARTMENT_OTHER): Payer: Medicare Other | Admitting: Hematology

## 2023-01-02 VITALS — BP 126/89 | HR 80 | Resp 19

## 2023-01-02 VITALS — BP 119/76 | HR 89 | Temp 97.7°F | Resp 19 | Ht 71.0 in | Wt 185.5 lb

## 2023-01-02 DIAGNOSIS — E1140 Type 2 diabetes mellitus with diabetic neuropathy, unspecified: Secondary | ICD-10-CM | POA: Diagnosis not present

## 2023-01-02 DIAGNOSIS — C7800 Secondary malignant neoplasm of unspecified lung: Secondary | ICD-10-CM | POA: Insufficient documentation

## 2023-01-02 DIAGNOSIS — Z7901 Long term (current) use of anticoagulants: Secondary | ICD-10-CM | POA: Insufficient documentation

## 2023-01-02 DIAGNOSIS — I82890 Acute embolism and thrombosis of other specified veins: Secondary | ICD-10-CM | POA: Diagnosis not present

## 2023-01-02 DIAGNOSIS — I2510 Atherosclerotic heart disease of native coronary artery without angina pectoris: Secondary | ICD-10-CM | POA: Diagnosis not present

## 2023-01-02 DIAGNOSIS — R5383 Other fatigue: Secondary | ICD-10-CM | POA: Diagnosis not present

## 2023-01-02 DIAGNOSIS — Z5111 Encounter for antineoplastic chemotherapy: Secondary | ICD-10-CM | POA: Diagnosis present

## 2023-01-02 DIAGNOSIS — E785 Hyperlipidemia, unspecified: Secondary | ICD-10-CM | POA: Diagnosis not present

## 2023-01-02 DIAGNOSIS — Z86010 Personal history of colonic polyps: Secondary | ICD-10-CM | POA: Insufficient documentation

## 2023-01-02 DIAGNOSIS — I700 Atherosclerosis of aorta: Secondary | ICD-10-CM | POA: Insufficient documentation

## 2023-01-02 DIAGNOSIS — C259 Malignant neoplasm of pancreas, unspecified: Secondary | ICD-10-CM

## 2023-01-02 DIAGNOSIS — Z87442 Personal history of urinary calculi: Secondary | ICD-10-CM | POA: Insufficient documentation

## 2023-01-02 DIAGNOSIS — G4700 Insomnia, unspecified: Secondary | ICD-10-CM | POA: Insufficient documentation

## 2023-01-02 DIAGNOSIS — K7689 Other specified diseases of liver: Secondary | ICD-10-CM | POA: Diagnosis not present

## 2023-01-02 DIAGNOSIS — E860 Dehydration: Secondary | ICD-10-CM

## 2023-01-02 DIAGNOSIS — Z95828 Presence of other vascular implants and grafts: Secondary | ICD-10-CM

## 2023-01-02 DIAGNOSIS — I480 Paroxysmal atrial fibrillation: Secondary | ICD-10-CM | POA: Insufficient documentation

## 2023-01-02 DIAGNOSIS — N132 Hydronephrosis with renal and ureteral calculous obstruction: Secondary | ICD-10-CM | POA: Insufficient documentation

## 2023-01-02 LAB — CMP (CANCER CENTER ONLY)
ALT: 23 U/L (ref 0–44)
AST: 23 U/L (ref 15–41)
Albumin: 4.1 g/dL (ref 3.5–5.0)
Alkaline Phosphatase: 118 U/L (ref 38–126)
Anion gap: 8 (ref 5–15)
BUN: 12 mg/dL (ref 8–23)
CO2: 26 mmol/L (ref 22–32)
Calcium: 9.4 mg/dL (ref 8.9–10.3)
Chloride: 105 mmol/L (ref 98–111)
Creatinine: 0.81 mg/dL (ref 0.61–1.24)
GFR, Estimated: >60 mL/min (ref >60)
Glucose, Bld: 267 mg/dL — ABNORMAL HIGH (ref 70–99)
Potassium: 4.2 mmol/L (ref 3.5–5.1)
Sodium: 139 mmol/L (ref 135–145)
Total Bilirubin: 0.4 mg/dL (ref 0.3–1.2)
Total Protein: 6.8 g/dL (ref 6.5–8.1)

## 2023-01-02 LAB — CBC WITH DIFFERENTIAL (CANCER CENTER ONLY)
Abs Immature Granulocytes: 0.02 10*3/uL (ref 0.00–0.07)
Basophils Absolute: 0 10*3/uL (ref 0.0–0.1)
Basophils Relative: 1 %
Eosinophils Absolute: 0.1 10*3/uL (ref 0.0–0.5)
Eosinophils Relative: 2 %
HCT: 36 % — ABNORMAL LOW (ref 39.0–52.0)
Hemoglobin: 12.8 g/dL — ABNORMAL LOW (ref 13.0–17.0)
Immature Granulocytes: 0 %
Lymphocytes Relative: 23 %
Lymphs Abs: 1.3 10*3/uL (ref 0.7–4.0)
MCH: 35.7 pg — ABNORMAL HIGH (ref 26.0–34.0)
MCHC: 35.6 g/dL (ref 30.0–36.0)
MCV: 100.3 fL — ABNORMAL HIGH (ref 80.0–100.0)
Monocytes Absolute: 0.5 10*3/uL (ref 0.1–1.0)
Monocytes Relative: 8 %
Neutro Abs: 3.9 10*3/uL (ref 1.7–7.7)
Neutrophils Relative %: 66 %
Platelet Count: 103 10*3/uL — ABNORMAL LOW (ref 150–400)
RBC: 3.59 MIL/uL — ABNORMAL LOW (ref 4.22–5.81)
RDW: 13.2 % (ref 11.5–15.5)
WBC Count: 5.8 10*3/uL (ref 4.0–10.5)
nRBC: 0 % (ref 0.0–0.2)

## 2023-01-02 MED ORDER — SODIUM CHLORIDE 0.9 % IV SOLN
800.0000 mg/m2 | Freq: Once | INTRAVENOUS | Status: AC
  Administered 2023-01-02: 1635 mg via INTRAVENOUS
  Filled 2023-01-02: qty 43

## 2023-01-02 MED ORDER — TRAMADOL HCL 50 MG PO TABS: 50.0000 mg | ORAL_TABLET | Freq: Four times a day (QID) | ORAL | 0 refills | Status: DC | PRN

## 2023-01-02 MED ORDER — GABAPENTIN 600 MG PO TABS: 600.0000 mg | ORAL_TABLET | Freq: Three times a day (TID) | ORAL | 1 refills | Status: DC | PRN

## 2023-01-02 MED ORDER — SODIUM CHLORIDE 0.9 % IV SOLN: Freq: Once | INTRAVENOUS | Status: AC

## 2023-01-02 MED ORDER — ALPRAZOLAM 0.25 MG PO TABS: 0.2500 mg | ORAL_TABLET | Freq: Two times a day (BID) | ORAL | 0 refills | Status: DC | PRN

## 2023-01-02 MED ORDER — HEPARIN SOD (PORK) LOCK FLUSH 100 UNIT/ML IV SOLN
500.0000 [IU] | Freq: Once | INTRAVENOUS | Status: AC | PRN
  Administered 2023-01-02: 500 [IU]

## 2023-01-02 MED ORDER — SODIUM CHLORIDE 0.9% FLUSH
10.0000 mL | INTRAVENOUS | Status: DC | PRN
  Administered 2023-01-02: 10 mL

## 2023-01-02 MED ORDER — PACLITAXEL PROTEIN-BOUND CHEMO INJECTION 100 MG
100.0000 mg/m2 | Freq: Once | INTRAVENOUS | Status: AC
  Administered 2023-01-02: 200 mg via INTRAVENOUS
  Filled 2023-01-02: qty 40

## 2023-01-02 MED ORDER — SODIUM CHLORIDE 0.9% FLUSH
10.0000 mL | Freq: Once | INTRAVENOUS | Status: AC
  Administered 2023-01-02: 10 mL

## 2023-01-02 MED ORDER — PROCHLORPERAZINE MALEATE 10 MG PO TABS
10.0000 mg | ORAL_TABLET | Freq: Once | ORAL | Status: AC
  Administered 2023-01-02: 10 mg via ORAL
  Filled 2023-01-02: qty 1

## 2023-01-02 NOTE — Progress Notes (Signed)
[place]   [phone] Fax:(336) [phone]   Clinic Follow up Note   Patient Care Team: [name], MD as PCP - General (Internal Medicine) [name], NP as PCP - Hematology/Oncology (Nurse Practitioner) [name], DO as PCP - Cardiology (Cardiology) [name], MD as Consulting Physician (Orthopedic Surgery) [name], MD as Consulting Physician (Cardiology) [name], MD as Consulting Physician (Oncology)  Date of Service:  01/02/2023  CHIEF COMPLAINT: f/u of  metastatic pancreatic cancer     CURRENT THERAPY:  Gemcitabine and Abraxane q28d   ASSESSMENT:  Zachary Reid is a 67 y.o. male with   Pancreatic adenocarcinoma ([place]) [phone] with numerous small cavitary lesions in bilateral lungs, MMR normal  -diagnosed 07/2021, after incidental finding on CT scan for kidney stone, by colonoscopy/EUS. -Baseline CA 19-9 elevated to 273 on 07/03/21 -He began first-line mFOLFIRINOX on 08/13/21, but required dose reduction and move to every 3 weeks. -his CA 19-9 trended up but is now stable. -restaging CT CAP 08/12/22 showed: multiple new and enlarging pulmonary metastases; pancreatic mass grossly stable. Will repeat in 3 months. -we changed his irinotecan to liposomal on 09/05/22. He tolerated better and feels stronger. -He did not tolerated Lip-irrinotecan at 60 mg/m2 dose at C2, will reduce back to 17m/m2 from cycle 3  -restaging CT from November 29, 2022 showed slightly worsening pulmonary metastasis, and enlarged retroperitoneal lymph node.  Compared to his previous CT 6 months ago, the changes are significant and meet care for cancer progression.  His tumor marker CA 19.9 has been trending up the past few months.  -His recent CT scan showed disease progression in his lung, I recommended changing treatment to gemcitabine and Abraxane, he is scheduled to start today.  -he again had a several questions about his prognosis, I answered to  his satisfaction. -His main side effect from chemotherapy, he is very sedentary for the first week after chemo, I encouraged him to stay active, and do gentle exercise like stationary bike at home.    PLAN: -recommend pt to get a stationary bike, walking or got to the [facility] for exercise  -lab reviewed -discuss Gemcitabine and Abraxane at reduce dose q 2wks to see how pt tolerates. -I refill Gabapentin ,Tramadol, Xanax -Proceed with D1,C1 Gemcitabine and Abraxane at reduce dose today  -lab/flush and GEMCITABINE and ABRAXNE  in 2 wks  SUMMARY OF ONCOLOGIC HISTORY: Oncology History Overview Note   Cancer Staging  Pancreatic adenocarcinoma ([facility] Staging form: Exocrine Pancreas, AJCC 8th Edition - Clinical stage from 07/26/2021: Stage IV (cT4, cN0, cM1) - Signed by FTruitt Merle MD on 07/28/2021    [place]ic adenocarcinoma ([place]  07/02/2021 Initial Diagnosis   Pancreatic adenocarcinoma ([place]   07/26/2021 Cancer Staging   Staging form: Exocrine Pancreas, AJCC 8th Edition - Clinical stage from 07/26/2021: Stage IV (cT4, cN0, cM1) - Signed by FTruitt Merle MD on 07/28/2021 Stage prefix: Initial diagnosis Total positive nodes: 0   08/13/2021 - 07/06/2022 Chemotherapy   Patient is on Treatment Plan : PANCREAS Modified FOLFIRINOX q14d x 4 cycles      Genetic Testing   [facility] CancerNext-Expanded results (77 genes) were negative. No pathogenic variants were identified. 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 The report date is 08/15/2021.    The [facility]Exp[facility] panel [facility] includes sequencing, rearrangement, and RNA analysis for the following 77 genes: AIP, ALK, APC, ATM, AXIN2, BAP1, BARD1, BLM, BMPR1A, BRCA1, BRCA2, BRIP1,  CDC73, CDH1, CDK4, CDKN1B, CDKN2A, CHEK2, CTNNA1, DICER1, FANCC, FH, FLCN, GALNT12, KIF1B, LZTR1, MAX, MEN1, MET, MLH1, MSH2, MSH3, MSH6, MUTYH, NBN, NF1, NF2, NTHL1, PALB2, PHOX2B, PMS2, POT1, PRKAR1A, PTCH1, PTEN,  RAD51C, RAD51D, RB1, RECQL, RET, SDHA, SDHAF2, SDHB, SDHC, SDHD, SMAD4, SMARCA4, SMARCB1, SMARCE1, STK11, SUFU, TMEM127, TP53, TSC1, TSC2, VHL and XRCC2 (sequencing and deletion/duplication); EGFR, EGLN1, HOXB13, KIT, MITF, PDGFRA, POLD1, and POLE (sequencing only); EPCAM and GREM1 (deletion/duplication only).     08/13/2021 - 12/06/2022 Chemotherapy   Patient is on Treatment Plan : PANCREAS Modified FOLFIRINOX q14d x 4 cycles     10/25/2021 Imaging   EXAM: CT CHEST, ABDOMEN, AND PELVIS WITH CONTRAST  IMPRESSION: 1. Innumerable bilateral pulmonary nodules, many of which are cavitary, consistent with metastatic disease. These nodules show no substantial change in are minimally progressed in the interval. 2. Mix cystic and solid lesion in the head and body of the pancreas is similar to prior and also comparing back to MRI 07/02/2021. 3. Hepatic cysts. 4. 8 mm nonobstructing left renal stone. 5. Aortic Atherosclerosis (ICD10-I70.0).   01/28/2022 Imaging   EXAM: CT CHEST, ABDOMEN, AND PELVIS WITH CONTRAST  IMPRESSION: 1. Innumerable bilateral small solid and cavitary pulmonary nodules, some of the cavitary nodules are slightly less thick-walled when compared with prior exam. 2. Mixed cystic and solid lesion in the head and body of the pancreas is similar to prior exam. 3. Nonobstructing left renal stone. 4.  Aortic Atherosclerosis (ICD10-I70.0).   11/29/2022 Imaging    IMPRESSION: 1. No significant change in primary pancreatic mass and adjacent cystic component. Unchanged appearance of soft tissue extending to involve the adjacent celiac axis, superior mesenteric artery origin, and portal confluence. 2. 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 3. Innumerable small pulmonary nodules throughout the lungs, the majority of which are cavitary. Although interval change is difficult to appreciate for the majority of  these nodules due to small size, at least some of these are slightly enlarged, consistent with worsened pulmonary metastatic disease. 4. Newly enlarged left retroperitoneal lymph nodes, which may reflect nodal metastatic disease or perhaps reactive to left hydronephrosis. Attention on follow-up. 5. A sizable calculus previously seen in the left renal pelvis has migrated to the middle third of the left ureter, with placement of a double-J left ureteral stent, with formed pigtails in the left renal pelvis and urinary bladder. Moderate left hydronephrosis and proximal hydroureter. 6. Coronary artery disease.   Aortic Atherosclerosis (ICD10-I70.0).   01/02/2023 -  Chemotherapy   Patient is on Treatment Plan : PANCREATIC Abraxane D1,8,15 + Gemcitabine D1,8,15 q28d        INTERVAL HISTORY:  Zachary Reid is here for a follow up of metastatic pancreatic cancer      He was last seen by me on 12/13/2022 He presents to the clinic accompanied by wife. Pt reports of still having extreme fatigue. Pt state the first two weeks he's very fatigue. Pt states that the second week is better. Pt state that he has fatigue in his legs, but is not physically active. Pt state he has did light yard week. Pt states he still have some slight Neuropathy in his fingers tip.      All other systems were reviewed with the patient and are negative.  MEDICAL HISTORY:  Past Medical History:  Diagnosis Date   Adenocarcinoma of pancreas, stage 4 ([place]) 07/2021   oncologist--- dr Burr Medico;   mets to  bilateral lung;  started chemo 08-13-2021   Anemia    GAD (generalized anxiety disorder)    History of adenomatous polyp of colon    Hyperlipidemia    Hypogonadism male    IBS (irritable bowel syndrome)    Insomnia    Left ureteral calculus    Metastatic cancer to lung ([place]) 07/2021   primary pancreatic cancer w/ numerous small cavity lesions bilateral lungs   PAF (paroxysmal atrial fibrillation) ([place]) 10/31/2022    cardiologist-- dr Raliegh Ip. tobb;  newly dx ED 10-31-2022 w/ RVR  in setting flank pain (kidney stone)  --  office note in epic 11-07-2022 normal ETT 11-13-2022, echo 11-07-2022 ef 65-70% w/ mild LVH, mild MR,  zio monitor not completed,  started on toprol and xarelto daily   Personal history of chemotherapy    Receiving chemotherapy for metastatic pancreatic cancer. Most recent infusion (as of 12/02/22) was on 11/15/22.   Port-A-Cath in place 08/10/2021   Type 2 diabetes mellitus treated with insulin [facility])    endocrinologist--- dr Dwyane Dee   Vitamin D deficiency    takes Vit D supplements   Wears contact lenses     SURGICAL HISTORY: Past Surgical History:  Procedure Laterality Date   APPENDECTOMY  1988   BIOPSY  07/26/2021   Procedure: BIOPSY;  Surgeon: Irving Copas., MD;  Location: [place];  Service: Gastroenterology;;   COLONOSCOPY WITH PROPOFOL N/A 07/26/2021   Procedure: COLONOSCOPY WITH PROPOFOL;  Surgeon: Irving Copas., MD;  Location: [place];  Service: Gastroenterology;  Laterality: N/A;   CYSTOSCOPY WITH STENT PLACEMENT Left 10/31/2022   Procedure: CYSTOSCOPY WITH STENT PLACEMENT;  Surgeon: [name], MD;  Location: [facility];  Service: Urology;  Laterality: Left;   CYSTOSCOPY/URETEROSCOPY/HOLMIUM LASER/STENT PLACEMENT Left 12/03/2022   Procedure: CYSTOSCOPY/LEFT URETEROSCOPY/HOLMIUM LASER/STENT EXCHANGE;  Surgeon: [name], MD;  Location: [facility];  Service: Urology;  Laterality: Left;  1 HR FOR CASE   ESOPHAGOGASTRODUODENOSCOPY (EGD) WITH PROPOFOL N/A 07/26/2021   Procedure: ESOPHAGOGASTRODUODENOSCOPY (EGD) WITH PROPOFOL;  Surgeon: Rush Landmark Telford Nab., MD;  Location: [place];  Service: Gastroenterology;  Laterality: N/A;   EUS N/A 07/26/2021   Procedure: UPPER ENDOSCOPIC ULTRASOUND (EUS) RADIAL;  Surgeon: Irving Copas., MD;  Location: [place];  Service: Gastroenterology;  Laterality: N/A;   FINE NEEDLE  ASPIRATION  07/26/2021   Procedure: FINE NEEDLE ASPIRATION (FNA) LINEAR;  Surgeon: Rush Landmark Telford Nab., MD;  Location: [place];  Service: Gastroenterology;;   IR IMAGING GUIDED PORT INSERTION  08/10/2021   POLYPECTOMY  07/26/2021   Procedure: POLYPECTOMY;  Surgeon: Rush Landmark Telford Nab., MD;  Location: [place];  Service: Gastroenterology;;   SHOULDER SURGERY Right    as a teenager    I have reviewed the social history and family history with the patient and they are unchanged from previous note.  ALLERGIES:  is allergic to lipitor [atorvastatin].  MEDICATIONS:  Current Outpatient Medications  Medication Sig Dispense Refill   ALPRAZolam (XANAX) 0.25 MG tablet Take 1-2 tablets (0.25-0.5 mg total) by mouth 2 (two) times daily as needed for anxiety. 60 tablet 0   Cholecalciferol (VITAMIN D3) 125 MCG (5000 UT) CAPS Take 5,000 Units by mouth in the morning and at bedtime.     Continuous Blood Gluc Sensor (DEXCOM G7 SENSOR) MISC 1 Device by Does not apply route as directed. Change sensor every 10 days (Patient taking differently: 1 Device by Does not apply route as directed. Change sensor every 10 days Per patient on 12/02/22, he has not yet  received glucose monitor.) 3 each 3   gabapentin (NEURONTIN) 600 MG tablet Take 1 tablet (600 mg total) by mouth 3 (three) times daily as needed (pain). 60 tablet 1   glucose blood test strip Use as instructed 100 each 12   HYDROcodone-acetaminophen (NORCO/VICODIN) 5-325 MG tablet Take 1 tablet by mouth every 4 (four) hours as needed for moderate pain. (Patient not taking: Reported on 12/02/2022) 20 tablet 0   hyoscyamine (LEVSIN SL) 0.125 MG SL tablet DISSOLVE ONE TABLET UNDER THE TONGUE 4 TIMES DAILY UP TO EVERY 4 HOURS AS NEEDED FOR NAUSEA, BLOATING, CRAMPING, OR DIARRHEA (Patient not taking: Reported on 12/02/2022) 100 tablet 0   insulin degludec (TRESIBA FLEXTOUCH) 100 UNIT/ML FlexTouch Pen Inject 32 Units into the skin daily. 15 mL 1    insulin lispro (HUMALOG KWIKPEN) 100 UNIT/ML KwikPen 10 TO 15 UNITS BEFORE MEALS AS DIRECTED (Patient taking differently: Inject 10-12 Units into the skin 3 (three) times daily before meals.) 15 mL 1   metoprolol succinate (TOPROL XL) 25 MG 24 hr tablet Take 0.5 tablets (12.5 mg total) by mouth daily. (Patient taking differently: Take 12.5 mg by mouth daily. Per patient, he will start taking Metoprolol on 12/02/22 and take again on the day of surgery (12/03/22.)) 45 tablet 3   oxyCODONE (ROXICODONE) 5 MG immediate release tablet Take 1 tablet (5 mg total) by mouth every 6 (six) hours as needed for up to 30 doses for severe pain. 30 tablet 0   prochlorperazine (COMPAZINE) 10 MG tablet Take 1 tablet (10 mg total) by mouth every 6 (six) hours as needed (Nausea or vomiting). 60 tablet 1   rivaroxaban (XARELTO) 20 MG TABS tablet Take 1 tablet (20 mg total) by mouth daily with supper. (Patient taking differently: Take 20 mg by mouth daily with supper. Patient stated that he last took Xarelto on 11/30/22.) 90 tablet 3   traMADol (ULTRAM) 50 MG tablet Take 1-2 tablets (50-100 mg total) by mouth every 6 (six) hours as needed. 120 tablet 0   zolpidem (AMBIEN) 10 MG tablet Take 1 tablet (10 mg total) by mouth at bedtime as needed for sleep. 30 tablet 0   No current facility-administered medications for this visit.    PHYSICAL EXAMINATION: ECOG PERFORMANCE STATUS: 1 - Symptomatic but completely ambulatory  Vitals:   01/02/23 1223  BP: 119/76  Pulse: 89  Resp: 19  Temp: 97.7 F (36.5 C)  SpO2: 98%   Wt Readings from Last 3 Encounters:  01/02/23 185 lb 8 oz (84.1 kg)  12/04/22 185 lb 8 oz (84.1 kg)  12/03/22 183 lb 14.4 oz (83.4 kg)     GENERAL:alert, no distress and comfortable SKIN: skin color normal, no rashes or significant lesions EYES: normal, Conjunctiva are pink and non-injected, sclera clear  NEURO: alert & oriented x 3 with fluent speech  LABORATORY DATA:  I have reviewed the data as  listed    Latest Ref Rng & Units 01/02/2023   11:53 AM 12/04/2022    9:22 AM 12/03/2022   11:43 AM  CBC  WBC 4.0 - 10.5 K/uL 5.8  4.7    Hemoglobin 13.0 - 17.0 g/dL 12.8  9.0  9.9   Hematocrit 39.0 - 52.0 % 36.0  25.3  29.0   Platelets 150 - 400 K/uL 103  118          Latest Ref Rng & Units 01/02/2023   11:53 AM 12/04/2022    9:22 AM 12/03/2022   11:43 AM  CMP  Glucose 70 - 99 mg/dL 267  286  175   BUN 8 - 23 mg/dL 12  14  13   $ Creatinine 0.61 - 1.24 mg/dL 0.81  0.93  0.80   Sodium 135 - 145 mmol/L 139  136  140   Potassium 3.5 - 5.1 mmol/L 4.2  3.9  4.4   Chloride 98 - 111 mmol/L 105  104  104   CO2 22 - 32 mmol/L 26  27    Calcium 8.9 - 10.3 mg/dL 9.4  9.0    Total Protein 6.5 - 8.1 g/dL 6.8  5.9    Total Bilirubin 0.3 - 1.2 mg/dL 0.4  0.4    Alkaline Phos 38 - 126 U/L 118  97    AST 15 - 41 U/L 23  17    ALT 0 - 44 U/L 23  17        RADIOGRAPHIC STUDIES: I have personally reviewed the radiological images as listed and agreed with the findings in the report. No results found.    No orders of the defined types were placed in this encounter.  All questions were answered. The patient knows to call the clinic with any problems, questions or concerns. No barriers to learning was detected. The total time spent in the appointment was 30 minutes.     [name], MD 01/02/2023   Felicity Coyer, CMA, am acting as scribe for [name], MD.   I have reviewed the above documentation for accuracy and completeness, and I agree with the above.

## 2023-01-02 NOTE — Patient Instructions (Signed)
[facility]  Discharge Instructions: Thank you for choosing [facility] to provide your oncology and hematology care.   If you have a lab appointment with the [facility], please go directly to the [facility] and check in at the registration area.   Wear comfortable clothing and clothing appropriate for easy access to any Portacath or PICC line.   We strive to give you quality time with your provider. You may need to reschedule your appointment if you arrive late (15 or more minutes).  Arriving late affects you and other patients whose appointments are after yours.  Also, if you miss three or more appointments without notifying the office, you may be dismissed from the clinic at the provider's discretion.      For prescription refill requests, have your pharmacy contact our office and allow 72 hours for refills to be completed.    Today you received the following chemotherapy and/or immunotherapy agents Abraxane, Gemzar      To help prevent nausea and vomiting after your treatment, we encourage you to take your nausea medication as directed.  BELOW ARE SYMPTOMS THAT SHOULD BE REPORTED IMMEDIATELY: *FEVER GREATER THAN 100.4 F (38 C) OR HIGHER *CHILLS OR SWEATING *NAUSEA AND VOMITING THAT IS NOT CONTROLLED WITH YOUR NAUSEA MEDICATION *UNUSUAL SHORTNESS OF BREATH *UNUSUAL BRUISING OR BLEEDING *URINARY PROBLEMS (pain or burning when urinating, or frequent urination) *BOWEL PROBLEMS (unusual diarrhea, constipation, pain near the anus) TENDERNESS IN MOUTH AND THROAT WITH OR WITHOUT PRESENCE OF ULCERS (sore throat, sores in mouth, or a toothache) UNUSUAL RASH, SWELLING OR PAIN  UNUSUAL VAGINAL DISCHARGE OR ITCHING   Items with * indicate a potential emergency and should be followed up as soon as possible or go to the Emergency Department if any problems should occur.  Please show the CHEMOTHERAPY ALERT CARD or IMMUNOTHERAPY ALERT CARD at  check-in to the Emergency Department and triage nurse.  Should you have questions after your visit or need to cancel or reschedule your appointment, please contact [facility]  Dept: [phone]  and follow the prompts.  Office hours are 8:00 a.m. to 4:30 p.m. Monday - Friday. Please note that voicemails left after 4:00 p.m. may not be returned until the following business day.  We are closed weekends and major holidays. You have access to a nurse at all times for urgent questions. Please call the main number to the clinic Dept: [phone] and follow the prompts.   For any non-urgent questions, you may also contact your provider using MyChart. We now offer e-Visits for anyone 61 and older to request care online for non-urgent symptoms. For details visit [email or web address].   Also download the MyChart app! Go to the app store, search "MyChart", open the app, select [facility], and log in with your MyChart username and password.

## 2023-01-02 NOTE — Assessment & Plan Note (Signed)
[id number] with numerous small cavitary lesions in bilateral lungs, MMR normal  -diagnosed 07/2021, after incidental finding on CT scan for kidney stone, by colonoscopy/EUS. -Baseline CA 19-9 elevated to 273 on 07/03/21 -He began first-line mFOLFIRINOX on 08/13/21, but required dose reduction and move to every 3 weeks. -his CA 19-9 trended up but is now stable. -restaging CT CAP 08/12/22 showed: multiple new and enlarging pulmonary metastases; pancreatic mass grossly stable. Will repeat in 3 months. -we changed his irinotecan to liposomal on 09/05/22. He tolerated better and feels stronger. -He did not tolerated Lip-irrinotecan at 60 mg/m2 dose at C2, will reduce back to 62m/m2 from cycle 3  -restaging CT from November 29, 2022 showed slightly worsening pulmonary metastasis, and enlarged retroperitoneal lymph node.  Compared to his previous CT 6 months ago, the changes are significant and meet care for cancer progression.  His tumor marker CA 19.9 has been trending up the past few months.  -His recent CT scan showed disease progression in his lung, I recommended changing treatment to gemcitabine and Abraxane, he is scheduled to start today.

## 2023-01-03 ENCOUNTER — Telehealth: Payer: Self-pay | Admitting: *Deleted

## 2023-01-03 NOTE — Telephone Encounter (Signed)
-----   Message from Casandra Doffing, RN sent at 01/02/2023  3:55 PM EST ----- Regarding: Dr Burr Medico 1st time Abraxane/Gemzar f/u Dr Burr Medico first time Abraxane/Gemzar. Pt tolerated tx well without incident. Callback due.

## 2023-01-03 NOTE — Telephone Encounter (Signed)
Called pt to see how he did with his new treatment.  He reports doing well although he did have some nausea yesterday & vomited but reports that nausea meds helped.  He wasn't concerned about this & reported "no big deal".  He knows how to reach [place] if needed & Knows his next appt.

## 2023-01-04 LAB — CANCER ANTIGEN 19-9: CA 19-9: 354 U/mL — ABNORMAL HIGH (ref 0–35)

## 2023-01-10 ENCOUNTER — Other Ambulatory Visit: Payer: Self-pay

## 2023-01-16 ENCOUNTER — Other Ambulatory Visit: Payer: Self-pay

## 2023-01-16 NOTE — Assessment & Plan Note (Signed)
[id number] with numerous small cavitary lesions in bilateral lungs, MMR normal  -diagnosed 07/2021, after incidental finding on CT scan for kidney stone, by colonoscopy/EUS. -Baseline CA 19-9 elevated to 273 on 07/03/21 -He began first-line mFOLFIRINOX on 08/13/21, but required dose reduction and move to every 3 weeks. -his CA 19-9 trended up but is now stable. -restaging CT CAP 08/12/22 showed: multiple new and enlarging pulmonary metastases; pancreatic mass grossly stable. Will repeat in 3 months. -we changed his irinotecan to liposomal on 09/05/22. He tolerated better and feels stronger. -Due to disease progression, his treatment was changed to gemcitabine and Abraxane every 2 weeks on 01/02/2023

## 2023-01-17 ENCOUNTER — Inpatient Hospital Stay: Payer: Medicare Other | Attending: Hematology | Admitting: Hematology

## 2023-01-17 ENCOUNTER — Inpatient Hospital Stay: Payer: Medicare Other

## 2023-01-17 ENCOUNTER — Other Ambulatory Visit: Payer: Self-pay

## 2023-01-17 ENCOUNTER — Encounter: Payer: Self-pay | Admitting: Hematology

## 2023-01-17 VITALS — BP 110/66 | HR 76 | Temp 97.7°F | Resp 18 | Ht 71.0 in | Wt 191.2 lb

## 2023-01-17 DIAGNOSIS — R590 Localized enlarged lymph nodes: Secondary | ICD-10-CM | POA: Insufficient documentation

## 2023-01-17 DIAGNOSIS — N132 Hydronephrosis with renal and ureteral calculous obstruction: Secondary | ICD-10-CM | POA: Diagnosis not present

## 2023-01-17 DIAGNOSIS — C259 Malignant neoplasm of pancreas, unspecified: Secondary | ICD-10-CM

## 2023-01-17 DIAGNOSIS — C250 Malignant neoplasm of head of pancreas: Secondary | ICD-10-CM | POA: Diagnosis not present

## 2023-01-17 DIAGNOSIS — E785 Hyperlipidemia, unspecified: Secondary | ICD-10-CM | POA: Insufficient documentation

## 2023-01-17 DIAGNOSIS — R110 Nausea: Secondary | ICD-10-CM | POA: Insufficient documentation

## 2023-01-17 DIAGNOSIS — I2510 Atherosclerotic heart disease of native coronary artery without angina pectoris: Secondary | ICD-10-CM | POA: Diagnosis not present

## 2023-01-17 DIAGNOSIS — Z7901 Long term (current) use of anticoagulants: Secondary | ICD-10-CM | POA: Diagnosis not present

## 2023-01-17 DIAGNOSIS — Z794 Long term (current) use of insulin: Secondary | ICD-10-CM | POA: Diagnosis not present

## 2023-01-17 DIAGNOSIS — E1140 Type 2 diabetes mellitus with diabetic neuropathy, unspecified: Secondary | ICD-10-CM | POA: Diagnosis not present

## 2023-01-17 DIAGNOSIS — E860 Dehydration: Secondary | ICD-10-CM

## 2023-01-17 DIAGNOSIS — I480 Paroxysmal atrial fibrillation: Secondary | ICD-10-CM | POA: Diagnosis not present

## 2023-01-17 DIAGNOSIS — Z86010 Personal history of colonic polyps: Secondary | ICD-10-CM | POA: Insufficient documentation

## 2023-01-17 DIAGNOSIS — I82890 Acute embolism and thrombosis of other specified veins: Secondary | ICD-10-CM | POA: Diagnosis not present

## 2023-01-17 DIAGNOSIS — I700 Atherosclerosis of aorta: Secondary | ICD-10-CM | POA: Insufficient documentation

## 2023-01-17 DIAGNOSIS — Z5111 Encounter for antineoplastic chemotherapy: Secondary | ICD-10-CM | POA: Diagnosis not present

## 2023-01-17 DIAGNOSIS — K7689 Other specified diseases of liver: Secondary | ICD-10-CM | POA: Diagnosis not present

## 2023-01-17 DIAGNOSIS — C7800 Secondary malignant neoplasm of unspecified lung: Secondary | ICD-10-CM | POA: Diagnosis not present

## 2023-01-17 DIAGNOSIS — E559 Vitamin D deficiency, unspecified: Secondary | ICD-10-CM | POA: Diagnosis not present

## 2023-01-17 DIAGNOSIS — Z95828 Presence of other vascular implants and grafts: Secondary | ICD-10-CM

## 2023-01-17 LAB — CBC WITH DIFFERENTIAL (CANCER CENTER ONLY)
Abs Immature Granulocytes: 0.02 10*3/uL (ref 0.00–0.07)
Basophils Absolute: 0 10*3/uL (ref 0.0–0.1)
Basophils Relative: 0 %
Eosinophils Absolute: 0.1 10*3/uL (ref 0.0–0.5)
Eosinophils Relative: 3 %
HCT: 32 % — ABNORMAL LOW (ref 39.0–52.0)
Hemoglobin: 11.3 g/dL — ABNORMAL LOW (ref 13.0–17.0)
Immature Granulocytes: 0 %
Lymphocytes Relative: 17 %
Lymphs Abs: 0.9 10*3/uL (ref 0.7–4.0)
MCH: 35.9 pg — ABNORMAL HIGH (ref 26.0–34.0)
MCHC: 35.3 g/dL (ref 30.0–36.0)
MCV: 101.6 fL — ABNORMAL HIGH (ref 80.0–100.0)
Monocytes Absolute: 0.6 10*3/uL (ref 0.1–1.0)
Monocytes Relative: 11 %
Neutro Abs: 3.6 10*3/uL (ref 1.7–7.7)
Neutrophils Relative %: 69 %
Platelet Count: 123 10*3/uL — ABNORMAL LOW (ref 150–400)
RBC: 3.15 MIL/uL — ABNORMAL LOW (ref 4.22–5.81)
RDW: 13.1 % (ref 11.5–15.5)
WBC Count: 5.2 10*3/uL (ref 4.0–10.5)
nRBC: 0 % (ref 0.0–0.2)

## 2023-01-17 LAB — CMP (CANCER CENTER ONLY)
ALT: 25 U/L (ref 0–44)
AST: 28 U/L (ref 15–41)
Albumin: 3.6 g/dL (ref 3.5–5.0)
Alkaline Phosphatase: 96 U/L (ref 38–126)
Anion gap: 6 (ref 5–15)
BUN: 16 mg/dL (ref 8–23)
CO2: 24 mmol/L (ref 22–32)
Calcium: 8.7 mg/dL — ABNORMAL LOW (ref 8.9–10.3)
Chloride: 109 mmol/L (ref 98–111)
Creatinine: 0.87 mg/dL (ref 0.61–1.24)
GFR, Estimated: >60 mL/min (ref >60)
Glucose, Bld: 143 mg/dL — ABNORMAL HIGH (ref 70–99)
Potassium: 3.7 mmol/L (ref 3.5–5.1)
Sodium: 139 mmol/L (ref 135–145)
Total Bilirubin: 0.5 mg/dL (ref 0.3–1.2)
Total Protein: 6.8 g/dL (ref 6.5–8.1)

## 2023-01-17 MED ORDER — SODIUM CHLORIDE 0.9% FLUSH
10.0000 mL | INTRAVENOUS | Status: DC | PRN
  Administered 2023-01-17: 10 mL

## 2023-01-17 MED ORDER — PROCHLORPERAZINE MALEATE 10 MG PO TABS
10.0000 mg | ORAL_TABLET | Freq: Once | ORAL | Status: AC
  Administered 2023-01-17: 10 mg via ORAL
  Filled 2023-01-17: qty 1

## 2023-01-17 MED ORDER — ALPRAZOLAM 0.25 MG PO TABS: 0.2500 mg | ORAL_TABLET | Freq: Two times a day (BID) | ORAL | 0 refills | Status: DC | PRN

## 2023-01-17 MED ORDER — SODIUM CHLORIDE 0.9 % IV SOLN
800.0000 mg/m2 | Freq: Once | INTRAVENOUS | Status: AC
  Administered 2023-01-17: 1635 mg via INTRAVENOUS
  Filled 2023-01-17: qty 43

## 2023-01-17 MED ORDER — SODIUM CHLORIDE 0.9 % IV SOLN: Freq: Once | INTRAVENOUS | Status: AC

## 2023-01-17 MED ORDER — HEPARIN SOD (PORK) LOCK FLUSH 100 UNIT/ML IV SOLN
500.0000 [IU] | Freq: Once | INTRAVENOUS | Status: AC | PRN
  Administered 2023-01-17: 500 [IU]

## 2023-01-17 MED ORDER — SODIUM CHLORIDE 0.9% FLUSH
10.0000 mL | Freq: Once | INTRAVENOUS | Status: AC
  Administered 2023-01-17: 10 mL

## 2023-01-17 MED ORDER — ZOLPIDEM TARTRATE 10 MG PO TABS: 10.0000 mg | ORAL_TABLET | Freq: Every evening | ORAL | 0 refills | Status: DC | PRN

## 2023-01-17 MED ORDER — PACLITAXEL PROTEIN-BOUND CHEMO INJECTION 100 MG
100.0000 mg/m2 | Freq: Once | INTRAVENOUS | Status: AC
  Administered 2023-01-17: 200 mg via INTRAVENOUS
  Filled 2023-01-17: qty 40

## 2023-01-17 NOTE — Progress Notes (Signed)
[name]   [phone] [phone]   Clinic Follow up Note   Patient Care Team: [name], MD as PCP - General (Internal Medicine) [name], NP as PCP - Hematology/Oncology (Nurse Practitioner) [name], DO as PCP - Cardiology (Cardiology) [name], MD as Consulting Physician (Orthopedic Surgery) [name], MD as Consulting Physician (Cardiology) [name], MD as Consulting Physician (Oncology)  Date of Service:  01/17/2023  CHIEF COMPLAINT: f/u of  metastatic pancreatic cancer       CURRENT THERAPY:   Gemcitabine and Abraxane q28d   ASSESSMENT:  Zachary Reid is a 67 y.o. male with   Pancreatic adenocarcinoma ([place]) [phone] with numerous small cavitary lesions in bilateral lungs, MMR normal  -diagnosed 07/2021, after incidental finding on CT scan for kidney stone, by colonoscopy/EUS. -Baseline CA 19-9 elevated to 273 on 07/03/21 -He began first-line mFOLFIRINOX on 08/13/21, but required dose reduction and move to every 3 weeks. -his CA 19-9 trended up but is now stable. -restaging CT CAP 08/12/22 showed: multiple new and enlarging pulmonary metastases; pancreatic mass grossly stable. Will repeat in 3 months. -we changed his irinotecan to liposomal on 09/05/22. He tolerated better and feels stronger. -Due to disease progression, his treatment was changed to gemcitabine and Abraxane every 2 weeks on 01/02/2023 -He tolerated the first cycle chemotherapy very well with mild fatigue.  He is very reluctant to increase the dose to full dose today, will continue the same dose.    PLAN: -lab reviewed -proceed with D15, C1 gemcitabine and abraxane at same reduce does -lab/flush and f/u  and chem 01/30/2023 -I refilled her Xanax and Ambien for him.  SUMMARY OF ONCOLOGIC HISTORY: Oncology History Overview Note   Cancer Staging  Pancreatic adenocarcinoma [facility]) Staging form: Exocrine Pancreas, AJCC 8th Edition - Clinical  stage from 07/26/2021: Stage IV (cT4, cN0, cM1) - Signed by [name], MD on 07/28/2021    Pancreatic adenocarcinoma ([place])  07/02/2021 Initial Diagnosis   Pancreatic adenocarcinoma ([place])   07/26/2021 Cancer Staging   Staging form: Exocrine Pancreas, AJCC 8th Edition - Clinical stage from 07/26/2021: Stage IV (cT4, cN0, cM1) - Signed by [name], MD on 07/28/2021 Stage prefix: Initial diagnosis Total positive nodes: 0   08/13/2021 - 07/06/2022 Chemotherapy   Patient is on Treatment Plan : PANCREAS Modified FOLFIRINOX q14d x 4 cycles      Genetic Testing   [facility] CancerNext-Expanded results (77 genes) were negative. No pathogenic variants were identified. 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 The report date is 08/15/2021.    The CancerNext-Expanded gene panel offered by [facility] and includes sequencing, rearrangement, and RNA analysis for the following 77 genes: AIP, ALK, APC, ATM, AXIN2, BAP1, BARD1, BLM, BMPR1A, BRCA1, BRCA2, BRIP1, CDC73, CDH1, CDK4, CDKN1B, CDKN2A, CHEK2, CTNNA1, DICER1, FANCC, FH, FLCN, GALNT12, KIF1B, LZTR1, MAX, MEN1, MET, MLH1, MSH2, MSH3, MSH6, MUTYH, NBN, NF1, NF2, NTHL1, PALB2, PHOX2B, PMS2, POT1, PRKAR1A, PTCH1, PTEN, RAD51C, RAD51D, RB1, RECQL, RET, SDHA, SDHAF2, SDHB, SDHC, SDHD, SMAD4, SMARCA4, SMARCB1, SMARCE1, STK11, SUFU, TMEM127, TP53, TSC1, TSC2, VHL and XRCC2 (sequencing and deletion/duplication); EGFR, EGLN1, HOXB13, KIT, MITF, PDGFRA, POLD1, and POLE (sequencing only); EPCAM and GREM1 (deletion/duplication only).     08/13/2021 - 12/06/2022 Chemotherapy   Patient is on Treatment Plan : PANCREAS Modified FOLFIRINOX q14d x 4 cycles     10/25/2021 Imaging   EXAM: CT CHEST, ABDOMEN, AND PELVIS WITH CONTRAST  IMPRESSION: 1. Innumerable bilateral pulmonary  nodules, many of which are cavitary, consistent with metastatic disease. These nodules show no substantial change in are minimally progressed in the interval. 2. Mix  cystic and solid lesion in the head and body of the pancreas is similar to prior and also comparing back to MRI 07/02/2021. 3. Hepatic cysts. 4. 8 mm nonobstructing left renal stone. 5. Aortic Atherosclerosis (ICD10-I70.0).   01/28/2022 Imaging   EXAM: CT CHEST, ABDOMEN, AND PELVIS WITH CONTRAST  IMPRESSION: 1. Innumerable bilateral small solid and cavitary pulmonary nodules, some of the cavitary nodules are slightly less thick-walled when compared with prior exam. 2. Mixed cystic and solid lesion in the head and body of the pancreas is similar to prior exam. 3. Nonobstructing left renal stone. 4.  Aortic Atherosclerosis (ICD10-I70.0).   11/29/2022 Imaging    IMPRESSION: 1. No significant change in primary pancreatic mass and adjacent cystic component. Unchanged appearance of soft tissue extending to involve the adjacent celiac axis, superior mesenteric artery origin, and portal confluence. 2. 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 3. Innumerable small pulmonary nodules throughout the lungs, the majority of which are cavitary. Although interval change is difficult to appreciate for the majority of these nodules due to small size, at least some of these are slightly enlarged, consistent with worsened pulmonary metastatic disease. 4. Newly enlarged left retroperitoneal lymph nodes, which may reflect nodal metastatic disease or perhaps reactive to left hydronephrosis. Attention on follow-up. 5. A sizable calculus previously seen in the left renal pelvis has migrated to the middle third of the left ureter, with placement of a double-J left ureteral stent, with formed pigtails in the left renal pelvis and urinary bladder. Moderate left hydronephrosis and proximal hydroureter. 6. Coronary artery disease.   Aortic Atherosclerosis (ICD10-I70.0).   01/02/2023 -  Chemotherapy   Patient is on Treatment Plan :  PANCREATIC Abraxane D1,8,15 + Gemcitabine D1,8,15 q28d        INTERVAL HISTORY:  Zachary Reid is here for a follow up of   metastatic pancreatic cancer     He was last seen by me on 01/02/2023 He presents to the clinic accompanied by wife. Pt stated that the treatment went well and that he had some joint pain.Pt also had an injection as well. Pt state the fatigue was better  and some nausea the day of and it lasted a couple of hours.     All other systems were reviewed with the patient and are negative.  MEDICAL HISTORY:  Past Medical History:  Diagnosis Date   Adenocarcinoma of pancreas, stage 4 ([place]) 07/2021   oncologist--- dr Burr Medico;   mets to  bilateral lung;  started chemo 08-13-2021   Anemia    GAD (generalized anxiety disorder)    History of adenomatous polyp of colon    Hyperlipidemia    Hypogonadism male    IBS (irritable bowel syndrome)    Insomnia    Left ureteral calculus    Metastatic cancer to lung ([place]) 07/2021   primary pancreatic cancer w/ numerous small cavity lesions bilateral lungs   PAF (paroxysmal atrial fibrillation) ([place]) 10/31/2022   cardiologist-- dr Raliegh Ip. tobb;  newly dx ED 10-31-2022 w/ RVR  in setting flank pain (kidney stone)  --  office note in epic 11-07-2022 normal ETT 11-13-2022, echo 11-07-2022 ef 65-70% w/ mild LVH, mild MR,  zio monitor not completed,  started on toprol and xarelto daily   Personal history of chemotherapy  Receiving chemotherapy for metastatic pancreatic cancer. Most recent infusion (as of 12/02/22) was on 11/15/22.   Port-A-Cath in place 08/10/2021   Type 2 diabetes mellitus treated with insulin [facility])    endocrinologist--- dr Dwyane Dee   Vitamin D deficiency    takes Vit D supplements   Wears contact lenses     SURGICAL HISTORY: Past Surgical History:  Procedure Laterality Date   APPENDECTOMY  1988   BIOPSY  07/26/2021   Procedure: BIOPSY;  Surgeon: Irving Copas., MD;  Location: [place];  Service:  Gastroenterology;;   COLONOSCOPY WITH PROPOFOL N/A 07/26/2021   Procedure: COLONOSCOPY WITH PROPOFOL;  Surgeon: Irving Copas., MD;  Location: [place];  Service: Gastroenterology;  Laterality: N/A;   CYSTOSCOPY WITH STENT PLACEMENT Left 10/31/2022   Procedure: CYSTOSCOPY WITH STENT PLACEMENT;  Surgeon: [name], MD;  Location: [facility];  Service: Urology;  Laterality: Left;   CYSTOSCOPY/URETEROSCOPY/HOLMIUM LASER/STENT PLACEMENT Left 12/03/2022   Procedure: CYSTOSCOPY/LEFT URETEROSCOPY/HOLMIUM LASER/STENT EXCHANGE;  Surgeon: [name], MD;  Location: [facility];  Service: Urology;  Laterality: Left;  1 HR FOR CASE   ESOPHAGOGASTRODUODENOSCOPY (EGD) WITH PROPOFOL N/A 07/26/2021   Procedure: ESOPHAGOGASTRODUODENOSCOPY (EGD) WITH PROPOFOL;  Surgeon: Rush Landmark Telford Nab., MD;  Location: [place];  Service: Gastroenterology;  Laterality: N/A;   EUS N/A 07/26/2021   Procedure: UPPER ENDOSCOPIC ULTRASOUND (EUS) RADIAL;  Surgeon: Irving Copas., MD;  Location: [place];  Service: Gastroenterology;  Laterality: N/A;   FINE NEEDLE ASPIRATION  07/26/2021   Procedure: FINE NEEDLE ASPIRATION (FNA) LINEAR;  Surgeon: Rush Landmark Telford Nab., MD;  Location: [place];  Service: Gastroenterology;;   IR IMAGING GUIDED PORT INSERTION  08/10/2021   POLYPECTOMY  07/26/2021   Procedure: POLYPECTOMY;  Surgeon: Rush Landmark Telford Nab., MD;  Location: [place];  Service: Gastroenterology;;   SHOULDER SURGERY Right    as a teenager    I have reviewed the social history and family history with the patient and they are unchanged from previous note.  ALLERGIES:  is allergic to lipitor [atorvastatin].  MEDICATIONS:  Current Outpatient Medications  Medication Sig Dispense Refill   ALPRAZolam (XANAX) 0.25 MG tablet Take 1-2 tablets (0.25-0.5 mg total) by mouth 2 (two) times daily as needed for anxiety. 60 tablet 0   Cholecalciferol (VITAMIN D3) 125  MCG (5000 UT) CAPS Take 5,000 Units by mouth in the morning and at bedtime.     Continuous Blood Gluc Sensor (DEXCOM G7 SENSOR) MISC 1 Device by Does not apply route as directed. Change sensor every 10 days (Patient taking differently: 1 Device by Does not apply route as directed. Change sensor every 10 days Per patient on 12/02/22, he has not yet received glucose monitor.) 3 each 3   gabapentin (NEURONTIN) 600 MG tablet Take 1 tablet (600 mg total) by mouth 3 (three) times daily as needed (pain). 60 tablet 1   glucose blood test strip Use as instructed 100 each 12   HYDROcodone-acetaminophen (NORCO/VICODIN) 5-325 MG tablet Take 1 tablet by mouth every 4 (four) hours as needed for moderate pain. (Patient not taking: Reported on 12/02/2022) 20 tablet 0   hyoscyamine (LEVSIN SL) 0.125 MG SL tablet DISSOLVE ONE TABLET UNDER THE TONGUE 4 TIMES DAILY UP TO EVERY 4 HOURS AS NEEDED FOR NAUSEA, BLOATING, CRAMPING, OR DIARRHEA (Patient not taking: Reported on 12/02/2022) 100 tablet 0   insulin degludec (TRESIBA FLEXTOUCH) 100 UNIT/ML FlexTouch Pen Inject 32 Units into the skin daily. 15 mL 1   insulin lispro (HUMALOG  KWIKPEN) 100 UNIT/ML KwikPen 10 TO 15 UNITS BEFORE MEALS AS DIRECTED (Patient taking differently: Inject 10-12 Units into the skin 3 (three) times daily before meals.) 15 mL 1   metoprolol succinate (TOPROL XL) 25 MG 24 hr tablet Take 0.5 tablets (12.5 mg total) by mouth daily. (Patient taking differently: Take 12.5 mg by mouth daily. Per patient, he will start taking Metoprolol on 12/02/22 and take again on the day of surgery (12/03/22.)) 45 tablet 3   oxyCODONE (ROXICODONE) 5 MG immediate release tablet Take 1 tablet (5 mg total) by mouth every 6 (six) hours as needed for up to 30 doses for severe pain. 30 tablet 0   prochlorperazine (COMPAZINE) 10 MG tablet Take 1 tablet (10 mg total) by mouth every 6 (six) hours as needed (Nausea or vomiting). 60 tablet 1   rivaroxaban (XARELTO) 20 MG TABS tablet Take  1 tablet (20 mg total) by mouth daily with supper. (Patient taking differently: Take 20 mg by mouth daily with supper. Patient stated that he last took Xarelto on 11/30/22.) 90 tablet 3   traMADol (ULTRAM) 50 MG tablet Take 1-2 tablets (50-100 mg total) by mouth every 6 (six) hours as needed. 120 tablet 0   zolpidem (AMBIEN) 10 MG tablet Take 1 tablet (10 mg total) by mouth at bedtime as needed for sleep. 30 tablet 0   No current facility-administered medications for this visit.   Facility-Administered Medications Ordered in Other Visits  Medication Dose Route Frequency Provider Last Rate Last Admin   heparin lock flush 100 unit/mL  500 Units Intracatheter Once PRN [name], MD       prochlorperazine (COMPAZINE) tablet 10 mg  10 mg Oral Once [name], MD       sodium chloride flush (NS) 0.9 % injection 10 mL  10 mL Intracatheter PRN [name], MD        PHYSICAL EXAMINATION: ECOG PERFORMANCE STATUS: 1 - Symptomatic but completely ambulatory  Vitals:   01/17/23 0945  BP: 110/66  Pulse: 76  Resp: 18  Temp: 97.7 F (36.5 C)  SpO2: 98%   Wt Readings from Last 3 Encounters:  01/17/23 191 lb 3.2 oz (86.7 kg)  01/02/23 185 lb 8 oz (84.1 kg)  12/04/22 185 lb 8 oz (84.1 kg)     GENERAL:alert, no distress and comfortable SKIN: skin color normal, no rashes or significant lesions EYES: normal, Conjunctiva are pink and non-injected, sclera clear  NEURO: alert & oriented x 3 with fluent speech   LABORATORY DATA:  I have reviewed the data as listed    Latest Ref Rng & Units 01/17/2023    9:26 AM 01/02/2023   11:53 AM 12/04/2022    9:22 AM  CBC  WBC 4.0 - 10.5 K/uL 5.2  5.8  4.7   Hemoglobin 13.0 - 17.0 g/dL 11.3  12.8  9.0   Hematocrit 39.0 - 52.0 % 32.0  36.0  25.3   Platelets 150 - 400 K/uL 123  103  118         Latest Ref Rng & Units 01/02/2023   11:53 AM 12/04/2022    9:22 AM 12/03/2022   11:43 AM  CMP  Glucose 70 - 99 mg/dL 267  286  175   BUN 8 - 23 mg/dL '12  14  13    '$ Creatinine 0.61 - 1.24 mg/dL 0.81  0.93  0.80   Sodium 135 - 145 mmol/L 139  136  140   Potassium 3.5 - 5.1 mmol/L  4.2  3.9  4.4   Chloride 98 - 111 mmol/L 105  104  104   CO2 22 - 32 mmol/L 26  27    Calcium 8.9 - 10.3 mg/dL 9.4  9.0    Total Protein 6.5 - 8.1 g/dL 6.8  5.9    Total Bilirubin 0.3 - 1.2 mg/dL 0.4  0.4    Alkaline Phos 38 - 126 U/L 118  97    AST 15 - 41 U/L 23  17    ALT 0 - 44 U/L 23  17        RADIOGRAPHIC STUDIES: I have personally reviewed the radiological images as listed and agreed with the findings in the report. No results found.    Orders Placed This Encounter  Procedures   CBC with Differential ([place] Only)    Standing Status:   Future    Standing Expiration Date:   02/27/2024   CMP ([place] only)    Standing Status:   Future    Standing Expiration Date:   02/27/2024   CBC with Differential ([place] Only)    Standing Status:   Future    Standing Expiration Date:   03/12/2024   CMP ([place] only)    Standing Status:   Future    Standing Expiration Date:   03/12/2024   All questions were answered. The patient knows to call the clinic with any problems, questions or concerns. No barriers to learning was detected. The total time spent in the appointment was 30 minutes.     [name], MD 01/17/2023   Felicity Coyer, CMA, am acting as scribe for [name], MD.   I have reviewed the above documentation for accuracy and completeness, and I agree with the above.

## 2023-01-22 ENCOUNTER — Other Ambulatory Visit: Payer: Self-pay

## 2023-01-29 NOTE — Assessment & Plan Note (Signed)
Zachary Reid with numerous small cavitary lesions in bilateral lungs, MMR normal  -diagnosed 07/2021, after incidental finding on CT scan for kidney stone, by colonoscopy/EUS. -Baseline CA 19-9 elevated to 273 on 07/03/21 -He began first-line mFOLFIRINOX on 08/13/21, but required dose reduction and move to every 3 weeks. -his CA 19-9 trended up but is now stable. -restaging CT CAP 08/12/22 showed: multiple new and enlarging pulmonary metastases; pancreatic mass grossly stable. Will repeat in 3 months. -we changed his irinotecan to liposomal on 09/05/22. He tolerated better and feels stronger. -Due to disease progression, his treatment was changed to gemcitabine and Abraxane every 2 weeks on 01/02/2023 -He tolerated the first two cycles chemotherapy very well with mild fatigue.  He is very reluctant to increase the dose to full dose today, will continue the same dose.

## 2023-01-29 NOTE — Progress Notes (Unsigned)
Zachary Reid   [phone] [phone]   Clinic Follow up Note   Patient Care Team: [name], MD as PCP - General (Internal Medicine) [name], NP as PCP - Hematology/Oncology (Nurse Practitioner) [name], DO as PCP - Cardiology (Cardiology) [name], MD as Consulting Physician (Orthopedic Surgery) [name], MD as Consulting Physician (Cardiology) [name], MD as Consulting Physician (Oncology)  Date of Service:  01/30/2023  CHIEF COMPLAINT: f/u of metastatic pancreatic cancer       CURRENT THERAPY:  Gemcitabine and Abraxane q28d    ASSESSMENT:  Zachary Reid is a 67 y.o. male with   Pancreatic adenocarcinoma ([place]) [phone] with numerous small cavitary lesions in bilateral lungs, MMR normal  -diagnosed 07/2021, after incidental finding on CT scan for kidney stone, by colonoscopy/EUS. -Baseline CA 19-9 elevated to 273 on 07/03/21 -He began first-line mFOLFIRINOX on 08/13/21, but required dose reduction and move to every 3 weeks. -his CA 19-9 trended up but is now stable. -restaging CT CAP 08/12/22 showed: multiple new and enlarging pulmonary metastases; pancreatic mass grossly stable. Will repeat in 3 months. -we changed his irinotecan to liposomal on 09/05/22. He tolerated better and feels stronger. -Due to disease progression, his treatment was changed to gemcitabine and Abraxane every 2 weeks on 01/02/2023 -He tolerated the first two cycles chemotherapy very well with mild fatigue.  He is open to increase chemo dose today, will increase gemcitabine to '1000mg'$ /m2, to see how he does.  -plan to repeat CT in late April     PLAN: -lab reviewed -I refill Tramadol -Proceed with C2 Abraxane /gemzar with increase dose of gemcitabine  -Tumor Marker -pending -lab,f/u and  Abraxane/Gemzar 02/14/2023 -Order CT scan next f/u with me in 4 weeks     SUMMARY OF ONCOLOGIC HISTORY: Oncology History Overview Note   Cancer  Staging  Pancreatic adenocarcinoma [facility]) Staging form: Exocrine Pancreas, AJCC 8th Edition - Clinical stage from 07/26/2021: Stage IV (cT4, cN0, cM1) - Signed by [name], MD on 07/28/2021    Pancreatic adenocarcinoma ([place])  07/02/2021 Initial Diagnosis   Pancreatic adenocarcinoma ([place])   07/26/2021 Cancer Staging   Staging form: Exocrine Pancreas, AJCC 8th Edition - Clinical stage from 07/26/2021: Stage IV (cT4, cN0, cM1) - Signed by [name], MD on 07/28/2021 Stage prefix: Initial diagnosis Total positive nodes: 0   08/13/2021 - 07/06/2022 Chemotherapy   Patient is on Treatment Plan : PANCREAS Modified FOLFIRINOX q14d x 4 cycles      Genetic Testing   [facility] CancerNext-Expanded results (77 genes) were negative. No pathogenic variants were identified. 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 The report date is 08/15/2021.    The CancerNext-Expanded gene panel offered by [facility] and includes sequencing, rearrangement, and RNA analysis for the following 77 genes: AIP, ALK, APC, ATM, AXIN2, BAP1, BARD1, BLM, BMPR1A, BRCA1, BRCA2, BRIP1, CDC73, CDH1, CDK4, CDKN1B, CDKN2A, CHEK2, CTNNA1, DICER1, FANCC, FH, FLCN, GALNT12, KIF1B, LZTR1, MAX, MEN1, MET, MLH1, MSH2, MSH3, MSH6, MUTYH, NBN, NF1, NF2, NTHL1, PALB2, PHOX2B, PMS2, POT1, PRKAR1A, PTCH1, PTEN, RAD51C, RAD51D, RB1, RECQL, RET, SDHA, SDHAF2, SDHB, SDHC, SDHD, SMAD4, SMARCA4, SMARCB1, SMARCE1, STK11, SUFU, TMEM127, TP53, TSC1, TSC2, VHL and XRCC2 (sequencing and deletion/duplication); EGFR, EGLN1, HOXB13, KIT, MITF, PDGFRA, POLD1, and POLE (sequencing only); EPCAM and GREM1 (deletion/duplication only).     08/13/2021 - 12/06/2022 Chemotherapy   Patient is on Treatment Plan : PANCREAS Modified FOLFIRINOX q14d x 4 cycles  10/25/2021 Imaging   EXAM: CT CHEST, ABDOMEN, AND PELVIS WITH CONTRAST  IMPRESSION: 1. Innumerable bilateral pulmonary nodules, many of which are cavitary, consistent with metastatic  disease. These nodules show no substantial change in are minimally progressed in the interval. 2. Mix cystic and solid lesion in the head and body of the pancreas is similar to prior and also comparing back to MRI 07/02/2021. 3. Hepatic cysts. 4. 8 mm nonobstructing left renal stone. 5. Aortic Atherosclerosis (ICD10-I70.0).   01/28/2022 Imaging   EXAM: CT CHEST, ABDOMEN, AND PELVIS WITH CONTRAST  IMPRESSION: 1. Innumerable bilateral small solid and cavitary pulmonary nodules, some of the cavitary nodules are slightly less thick-walled when compared with prior exam. 2. Mixed cystic and solid lesion in the head and body of the pancreas is similar to prior exam. 3. Nonobstructing left renal stone. 4.  Aortic Atherosclerosis (ICD10-I70.0).   11/29/2022 Imaging    IMPRESSION: 1. No significant change in primary pancreatic mass and adjacent cystic component. Unchanged appearance of soft tissue extending to involve the adjacent celiac axis, superior mesenteric artery origin, and portal confluence. 2. 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 3. Innumerable small pulmonary nodules throughout the lungs, the majority of which are cavitary. Although interval change is difficult to appreciate for the majority of these nodules due to small size, at least some of these are slightly enlarged, consistent with worsened pulmonary metastatic disease. 4. Newly enlarged left retroperitoneal lymph nodes, which may reflect nodal metastatic disease or perhaps reactive to left hydronephrosis. Attention on follow-up. 5. A sizable calculus previously seen in the left renal pelvis has migrated to the middle third of the left ureter, with placement of a double-J left ureteral stent, with formed pigtails in the left renal pelvis and urinary bladder. Moderate left hydronephrosis and proximal hydroureter. 6. Coronary artery disease.    Aortic Atherosclerosis (ICD10-I70.0).   01/02/2023 -  Chemotherapy   Patient is on Treatment Plan : PANCREATIC Abraxane D1,8,15 + Gemcitabine D1,8,15 q28d        INTERVAL HISTORY:  Zachary Reid is here for a follow up of metastatic pancreatic cancer      He was last seen by me on 01/17/2023 He presents to the clinic alone. Pt state he has some fatigue and some neuropathy but is manageable . Pt reports that his appetite is good and his pain is good. Pt state that he only has one day of nausea and the next da he is fine.   All other systems were reviewed with the patient and are negative.  MEDICAL HISTORY:  Past Medical History:  Diagnosis Date   Adenocarcinoma of pancreas, stage 4 ([place]) 07/2021   oncologist--- dr Burr Medico;   mets to  bilateral lung;  started chemo 08-13-2021   Anemia    GAD (generalized anxiety disorder)    History of adenomatous polyp of colon    Hyperlipidemia    Hypogonadism male    IBS (irritable bowel syndrome)    Insomnia    Left ureteral calculus    Metastatic cancer to lung ([place]) 07/2021   primary pancreatic cancer w/ numerous small cavity lesions bilateral lungs   PAF (paroxysmal atrial fibrillation) ([place]) 10/31/2022   cardiologist-- dr Raliegh Ip. tobb;  newly dx ED 10-31-2022 w/ RVR  in setting flank pain (kidney stone)  --  office note in epic 11-07-2022 normal ETT 11-13-2022, echo 11-07-2022 ef 65-70% w/ mild LVH, mild MR,  zio monitor not completed,  started on toprol and xarelto daily   Personal history of chemotherapy    Receiving chemotherapy for metastatic pancreatic cancer. Most recent infusion (as of 12/02/22) was on 11/15/22.   Port-A-Cath in place 08/10/2021   Type 2 diabetes mellitus treated with insulin [facility])    endocrinologist--- dr Dwyane Dee   Vitamin D deficiency    takes Vit D supplements   Wears contact lenses     SURGICAL HISTORY: Past Surgical History:  Procedure Laterality Date   APPENDECTOMY  1988   BIOPSY  07/26/2021   Procedure: BIOPSY;   Surgeon: Irving Copas., MD;  Location: [place];  Service: Gastroenterology;;   COLONOSCOPY WITH PROPOFOL N/A 07/26/2021   Procedure: COLONOSCOPY WITH PROPOFOL;  Surgeon: Irving Copas., MD;  Location: [place];  Service: Gastroenterology;  Laterality: N/A;   CYSTOSCOPY WITH STENT PLACEMENT Left 10/31/2022   Procedure: CYSTOSCOPY WITH STENT PLACEMENT;  Surgeon: [name], MD;  Location: [facility];  Service: Urology;  Laterality: Left;   CYSTOSCOPY/URETEROSCOPY/HOLMIUM LASER/STENT PLACEMENT Left 12/03/2022   Procedure: CYSTOSCOPY/LEFT URETEROSCOPY/HOLMIUM LASER/STENT EXCHANGE;  Surgeon: [name], MD;  Location: [facility];  Service: Urology;  Laterality: Left;  1 HR FOR CASE   ESOPHAGOGASTRODUODENOSCOPY (EGD) WITH PROPOFOL N/A 07/26/2021   Procedure: ESOPHAGOGASTRODUODENOSCOPY (EGD) WITH PROPOFOL;  Surgeon: Rush Landmark Telford Nab., MD;  Location: [place];  Service: Gastroenterology;  Laterality: N/A;   EUS N/A 07/26/2021   Procedure: UPPER ENDOSCOPIC ULTRASOUND (EUS) RADIAL;  Surgeon: Irving Copas., MD;  Location: [place];  Service: Gastroenterology;  Laterality: N/A;   FINE NEEDLE ASPIRATION  07/26/2021   Procedure: FINE NEEDLE ASPIRATION (FNA) LINEAR;  Surgeon: Rush Landmark Telford Nab., MD;  Location: [place];  Service: Gastroenterology;;   IR IMAGING GUIDED PORT INSERTION  08/10/2021   POLYPECTOMY  07/26/2021   Procedure: POLYPECTOMY;  Surgeon: Rush Landmark Telford Nab., MD;  Location: [place];  Service: Gastroenterology;;   SHOULDER SURGERY Right    as a teenager    I have reviewed the social history and family history with the patient and they are unchanged from previous note.  ALLERGIES:  is allergic to lipitor [atorvastatin].  MEDICATIONS:  Current Outpatient Medications  Medication Sig Dispense Refill   ALPRAZolam (XANAX) 0.25 MG tablet Take 1-2 tablets (0.25-0.5 mg total) by mouth 2 (two) times daily  as needed for anxiety. 60 tablet 0   Cholecalciferol (VITAMIN D3) 125 MCG (5000 UT) CAPS Take 5,000 Units by mouth in the morning and at bedtime.     Continuous Blood Gluc Sensor (DEXCOM G7 SENSOR) MISC 1 Device by Does not apply route as directed. Change sensor every 10 days (Patient taking differently: 1 Device by Does not apply route as directed. Change sensor every 10 days Per patient on 12/02/22, he has not yet received glucose monitor.) 3 each 3   gabapentin (NEURONTIN) 600 MG tablet Take 1 tablet (600 mg total) by mouth 3 (three) times daily as needed (pain). 60 tablet 1   glucose blood test strip Use as instructed 100 each 12   HYDROcodone-acetaminophen (NORCO/VICODIN) 5-325 MG tablet Take 1 tablet by mouth every 4 (four) hours as needed for moderate pain. (Patient not taking: Reported on 12/02/2022) 20 tablet 0   hyoscyamine (LEVSIN SL) 0.125 MG SL tablet DISSOLVE ONE TABLET UNDER THE TONGUE 4 TIMES DAILY UP TO EVERY 4 HOURS AS NEEDED FOR NAUSEA, BLOATING, CRAMPING, OR DIARRHEA (Patient not taking: Reported on 12/02/2022) 100 tablet 0   insulin degludec (TRESIBA FLEXTOUCH) 100 UNIT/ML FlexTouch Pen  Inject 32 Units into the skin daily. 15 mL 1   insulin lispro (HUMALOG KWIKPEN) 100 UNIT/ML KwikPen 10 TO 15 UNITS BEFORE MEALS AS DIRECTED (Patient taking differently: Inject 10-12 Units into the skin 3 (three) times daily before meals.) 15 mL 1   metoprolol succinate (TOPROL XL) 25 MG 24 hr tablet Take 0.5 tablets (12.5 mg total) by mouth daily. (Patient taking differently: Take 12.5 mg by mouth daily. Per patient, he will start taking Metoprolol on 12/02/22 and take again on the day of surgery (12/03/22.)) 45 tablet 3   oxyCODONE (ROXICODONE) 5 MG immediate release tablet Take 1 tablet (5 mg total) by mouth every 6 (six) hours as needed for up to 30 doses for severe pain. 30 tablet 0   prochlorperazine (COMPAZINE) 10 MG tablet Take 1 tablet (10 mg total) by mouth every 6 (six) hours as needed (Nausea or  vomiting). 60 tablet 1   rivaroxaban (XARELTO) 20 MG TABS tablet Take 1 tablet (20 mg total) by mouth daily with supper. (Patient taking differently: Take 20 mg by mouth daily with supper. Patient stated that he last took Xarelto on 11/30/22.) 90 tablet 3   traMADol (ULTRAM) 50 MG tablet Take 1-2 tablets (50-100 mg total) by mouth every 6 (six) hours as needed. 120 tablet 0   zolpidem (AMBIEN) 10 MG tablet Take 1 tablet (10 mg total) by mouth at bedtime as needed for sleep. 30 tablet 0   No current facility-administered medications for this visit.    PHYSICAL EXAMINATION: ECOG PERFORMANCE STATUS: 1 - Symptomatic but completely ambulatory  Vitals:   01/30/23 0830  BP: 138/80  Pulse: 80  Resp: 18  Temp: 98 F (36.7 C)  SpO2: 98%   Wt Readings from Last 3 Encounters:  01/30/23 188 lb 14.4 oz (85.7 kg)  01/17/23 191 lb 3.2 oz (86.7 kg)  01/02/23 185 lb 8 oz (84.1 kg)     GENERAL:alert, no distress and comfortable SKIN: skin color normal, no rashes or significant lesions EYES: normal, Conjunctiva are pink and non-injected, sclera clear  NEURO: alert & oriented x 3 with fluent speech   LABORATORY DATA:  I have reviewed the data as listed    Latest Ref Rng & Units 01/30/2023    8:08 AM 01/17/2023    9:26 AM 01/02/2023   11:53 AM  CBC  WBC 4.0 - 10.5 K/uL 6.8  5.2  5.8   Hemoglobin 13.0 - 17.0 g/dL 13.9  11.3  12.8   Hematocrit 39.0 - 52.0 % 39.8  32.0  36.0   Platelets 150 - 400 K/uL 116  123  103         Latest Ref Rng & Units 01/30/2023    8:08 AM 01/17/2023    9:26 AM 01/02/2023   11:53 AM  CMP  Glucose 70 - 99 mg/dL 169  143  267   BUN 8 - 23 mg/dL '19  16  12   '$ Creatinine 0.61 - 1.24 mg/dL 1.01  0.87  0.81   Sodium 135 - 145 mmol/L 141  139  139   Potassium 3.5 - 5.1 mmol/L 4.1  3.7  4.2   Chloride 98 - 111 mmol/L 108  109  105   CO2 22 - 32 mmol/L '26  24  26   '$ Calcium 8.9 - 10.3 mg/dL 9.2  8.7  9.4   Total Protein 6.5 - 8.1 g/dL 7.3  6.8  6.8   Total Bilirubin 0.3 -  1.2 mg/dL 0.4  0.5  0.4   Alkaline Phos 38 - 126 U/L 111  96  118   AST 15 - 41 U/L '23  28  23   '$ ALT 0 - 44 U/L '25  25  23       '$ RADIOGRAPHIC STUDIES: I have personally reviewed the radiological images as listed and agreed with the findings in the report. No results found.    Orders Placed This Encounter  Procedures   CBC with Differential ([place] Only)    Standing Status:   Future    Standing Expiration Date:   03/27/2024   CMP ([place] only)    Standing Status:   Future    Standing Expiration Date:   03/27/2024   CBC with Differential ([place] Only)    Standing Status:   Future    Standing Expiration Date:   04/10/2024   CMP ([place] only)    Standing Status:   Future    Standing Expiration Date:   04/10/2024   All questions were answered. The patient knows to call the clinic with any problems, questions or concerns. No barriers to learning was detected. The total time spent in the appointment was 30 minutes.     [name], MD 01/30/2023   Felicity Coyer, CMA, am acting as scribe for [name], MD.   I have reviewed the above documentation for accuracy and completeness, and I agree with the above.

## 2023-01-30 ENCOUNTER — Encounter: Payer: Self-pay | Admitting: Hematology

## 2023-01-30 ENCOUNTER — Inpatient Hospital Stay (HOSPITAL_BASED_OUTPATIENT_CLINIC_OR_DEPARTMENT_OTHER): Payer: Medicare Other | Admitting: Hematology

## 2023-01-30 ENCOUNTER — Inpatient Hospital Stay: Payer: Medicare Other

## 2023-01-30 ENCOUNTER — Other Ambulatory Visit: Payer: Self-pay

## 2023-01-30 VITALS — BP 106/74 | HR 51 | Resp 17

## 2023-01-30 VITALS — BP 138/80 | HR 80 | Temp 98.0°F | Resp 18 | Ht 71.0 in | Wt 188.9 lb

## 2023-01-30 DIAGNOSIS — E860 Dehydration: Secondary | ICD-10-CM

## 2023-01-30 DIAGNOSIS — C259 Malignant neoplasm of pancreas, unspecified: Secondary | ICD-10-CM

## 2023-01-30 DIAGNOSIS — Z95828 Presence of other vascular implants and grafts: Secondary | ICD-10-CM

## 2023-01-30 DIAGNOSIS — Z5111 Encounter for antineoplastic chemotherapy: Secondary | ICD-10-CM | POA: Diagnosis not present

## 2023-01-30 LAB — CMP (CANCER CENTER ONLY)
ALT: 25 U/L (ref 0–44)
AST: 23 U/L (ref 15–41)
Albumin: 4.2 g/dL (ref 3.5–5.0)
Alkaline Phosphatase: 111 U/L (ref 38–126)
Anion gap: 7 (ref 5–15)
BUN: 19 mg/dL (ref 8–23)
CO2: 26 mmol/L (ref 22–32)
Calcium: 9.2 mg/dL (ref 8.9–10.3)
Chloride: 108 mmol/L (ref 98–111)
Creatinine: 1.01 mg/dL (ref 0.61–1.24)
GFR, Estimated: >60 mL/min (ref >60)
Glucose, Bld: 169 mg/dL — ABNORMAL HIGH (ref 70–99)
Potassium: 4.1 mmol/L (ref 3.5–5.1)
Sodium: 141 mmol/L (ref 135–145)
Total Bilirubin: 0.4 mg/dL (ref 0.3–1.2)
Total Protein: 7.3 g/dL (ref 6.5–8.1)

## 2023-01-30 LAB — CBC WITH DIFFERENTIAL (CANCER CENTER ONLY)
Abs Immature Granulocytes: 0.03 10*3/uL (ref 0.00–0.07)
Basophils Absolute: 0 10*3/uL (ref 0.0–0.1)
Basophils Relative: 0 %
Eosinophils Absolute: 0.2 10*3/uL (ref 0.0–0.5)
Eosinophils Relative: 3 %
HCT: 39.8 % (ref 39.0–52.0)
Hemoglobin: 13.9 g/dL (ref 13.0–17.0)
Immature Granulocytes: 0 %
Lymphocytes Relative: 27 %
Lymphs Abs: 1.8 10*3/uL (ref 0.7–4.0)
MCH: 35 pg — ABNORMAL HIGH (ref 26.0–34.0)
MCHC: 34.9 g/dL (ref 30.0–36.0)
MCV: 100.3 fL — ABNORMAL HIGH (ref 80.0–100.0)
Monocytes Absolute: 0.8 10*3/uL (ref 0.1–1.0)
Monocytes Relative: 11 %
Neutro Abs: 4 10*3/uL (ref 1.7–7.7)
Neutrophils Relative %: 59 %
Platelet Count: 116 10*3/uL — ABNORMAL LOW (ref 150–400)
RBC: 3.97 MIL/uL — ABNORMAL LOW (ref 4.22–5.81)
RDW: 13.1 % (ref 11.5–15.5)
WBC Count: 6.8 10*3/uL (ref 4.0–10.5)
nRBC: 0 % (ref 0.0–0.2)

## 2023-01-30 MED ORDER — SODIUM CHLORIDE 0.9% FLUSH
10.0000 mL | INTRAVENOUS | Status: DC | PRN
  Administered 2023-01-30: 10 mL

## 2023-01-30 MED ORDER — TRAMADOL HCL 50 MG PO TABS: 50.0000 mg | ORAL_TABLET | Freq: Four times a day (QID) | ORAL | 0 refills | Status: DC | PRN

## 2023-01-30 MED ORDER — SODIUM CHLORIDE 0.9% FLUSH
10.0000 mL | Freq: Once | INTRAVENOUS | Status: AC
  Administered 2023-01-30: 10 mL

## 2023-01-30 MED ORDER — SODIUM CHLORIDE 0.9 % IV SOLN
1000.0000 mg/m2 | Freq: Once | INTRAVENOUS | Status: AC
  Administered 2023-01-30: 2052 mg via INTRAVENOUS
  Filled 2023-01-30: qty 53.97

## 2023-01-30 MED ORDER — SODIUM CHLORIDE 0.9 % IV SOLN: Freq: Once | INTRAVENOUS | Status: AC

## 2023-01-30 MED ORDER — PROCHLORPERAZINE MALEATE 10 MG PO TABS
10.0000 mg | ORAL_TABLET | Freq: Once | ORAL | Status: AC
  Administered 2023-01-30: 10 mg via ORAL
  Filled 2023-01-30: qty 1

## 2023-01-30 MED ORDER — HEPARIN SOD (PORK) LOCK FLUSH 100 UNIT/ML IV SOLN
500.0000 [IU] | Freq: Once | INTRAVENOUS | Status: AC | PRN
  Administered 2023-01-30: 500 [IU]

## 2023-01-30 MED ORDER — PACLITAXEL PROTEIN-BOUND CHEMO INJECTION 100 MG
100.0000 mg/m2 | Freq: Once | INTRAVENOUS | Status: AC
  Administered 2023-01-30: 200 mg via INTRAVENOUS
  Filled 2023-01-30: qty 40

## 2023-01-30 NOTE — Patient Instructions (Signed)
[facility]  Discharge Instructions: Thank you for choosing [facility] to provide your oncology and hematology care.   If you have a lab appointment with the Cancer Center, please go directly to the Cancer Center and check in at the registration area.   Wear comfortable clothing and clothing appropriate for easy access to any Portacath or PICC line.   We strive to give you quality time with your provider. You may need to reschedule your appointment if you arrive late (15 or more minutes).  Arriving late affects you and other patients whose appointments are after yours.  Also, if you miss three or more appointments without notifying the office, you may be dismissed from the clinic at the provider's discretion.      For prescription refill requests, have your pharmacy contact our office and allow 72 hours for refills to be completed.    Today you received the following chemotherapy and/or immunotherapy agents: paclitaxel-protein bound and gemcitabine      To help prevent nausea and vomiting after your treatment, we encourage you to take your nausea medication as directed.  BELOW ARE SYMPTOMS THAT SHOULD BE REPORTED IMMEDIATELY: *FEVER GREATER THAN 100.4 F (38 C) OR HIGHER *CHILLS OR SWEATING *NAUSEA AND VOMITING THAT IS NOT CONTROLLED WITH YOUR NAUSEA MEDICATION *UNUSUAL SHORTNESS OF BREATH *UNUSUAL BRUISING OR BLEEDING *URINARY PROBLEMS (pain or burning when urinating, or frequent urination) *BOWEL PROBLEMS (unusual diarrhea, constipation, pain near the anus) TENDERNESS IN MOUTH AND THROAT WITH OR WITHOUT PRESENCE OF ULCERS (sore throat, sores in mouth, or a toothache) UNUSUAL RASH, SWELLING OR PAIN  UNUSUAL VAGINAL DISCHARGE OR ITCHING   Items with * indicate a potential emergency and should be followed up as soon as possible or go to the Emergency Department if any problems should occur.  Please show the CHEMOTHERAPY ALERT CARD or  IMMUNOTHERAPY ALERT CARD at check-in to the Emergency Department and triage nurse.  Should you have questions after your visit or need to cancel or reschedule your appointment, please contact [facility]  Dept: [phone]  and follow the prompts.  Office hours are 8:00 a.m. to 4:30 p.m. Monday - Friday. Please note that voicemails left after 4:00 p.m. may not be returned until the following business day.  We are closed weekends and major holidays. You have access to a nurse at all times for urgent questions. Please call the main number to the clinic Dept: [phone] and follow the prompts.   For any non-urgent questions, you may also contact your provider using MyChart. We now offer e-Visits for anyone 18 and older to request care online for non-urgent symptoms. For details visit [email or web address].   Also download the MyChart app! Go to the app store, search "MyChart", open the app, select [facility], and log in with your MyChart username and password.   

## 2023-01-31 ENCOUNTER — Other Ambulatory Visit: Payer: Medicare Other

## 2023-02-02 LAB — CANCER ANTIGEN 19-9: CA 19-9: 403 U/mL — ABNORMAL HIGH (ref 0–35)

## 2023-02-04 ENCOUNTER — Ambulatory Visit: Payer: Medicare Other | Admitting: Endocrinology

## 2023-02-13 ENCOUNTER — Other Ambulatory Visit: Payer: Medicare Other

## 2023-02-13 ENCOUNTER — Ambulatory Visit: Payer: Medicare Other | Admitting: Hematology

## 2023-02-13 ENCOUNTER — Ambulatory Visit: Payer: Medicare Other

## 2023-02-14 ENCOUNTER — Inpatient Hospital Stay (HOSPITAL_BASED_OUTPATIENT_CLINIC_OR_DEPARTMENT_OTHER): Payer: Medicare Other | Admitting: Physician Assistant

## 2023-02-14 ENCOUNTER — Inpatient Hospital Stay: Payer: Medicare Other

## 2023-02-14 ENCOUNTER — Other Ambulatory Visit: Payer: Self-pay

## 2023-02-14 VITALS — BP 102/60 | HR 52 | Temp 97.7°F | Resp 20 | Wt 192.8 lb

## 2023-02-14 DIAGNOSIS — Z5111 Encounter for antineoplastic chemotherapy: Secondary | ICD-10-CM | POA: Diagnosis not present

## 2023-02-14 DIAGNOSIS — Z95828 Presence of other vascular implants and grafts: Secondary | ICD-10-CM

## 2023-02-14 DIAGNOSIS — C259 Malignant neoplasm of pancreas, unspecified: Secondary | ICD-10-CM | POA: Diagnosis not present

## 2023-02-14 DIAGNOSIS — E860 Dehydration: Secondary | ICD-10-CM

## 2023-02-14 LAB — CMP (CANCER CENTER ONLY)
ALT: 23 U/L (ref 0–44)
AST: 20 U/L (ref 15–41)
Albumin: 4.1 g/dL (ref 3.5–5.0)
Alkaline Phosphatase: 111 U/L (ref 38–126)
Anion gap: 6 (ref 5–15)
BUN: 21 mg/dL (ref 8–23)
CO2: 26 mmol/L (ref 22–32)
Calcium: 9.2 mg/dL (ref 8.9–10.3)
Chloride: 108 mmol/L (ref 98–111)
Creatinine: 1.02 mg/dL (ref 0.61–1.24)
GFR, Estimated: >60 mL/min (ref >60)
Glucose, Bld: 178 mg/dL — ABNORMAL HIGH (ref 70–99)
Potassium: 4.1 mmol/L (ref 3.5–5.1)
Sodium: 140 mmol/L (ref 135–145)
Total Bilirubin: 0.4 mg/dL (ref 0.3–1.2)
Total Protein: 7.2 g/dL (ref 6.5–8.1)

## 2023-02-14 LAB — CBC WITH DIFFERENTIAL (CANCER CENTER ONLY)
Abs Immature Granulocytes: 0.03 10*3/uL (ref 0.00–0.07)
Basophils Absolute: 0 10*3/uL (ref 0.0–0.1)
Basophils Relative: 1 %
Eosinophils Absolute: 0.2 10*3/uL (ref 0.0–0.5)
Eosinophils Relative: 3 %
HCT: 40 % (ref 39.0–52.0)
Hemoglobin: 13.9 g/dL (ref 13.0–17.0)
Immature Granulocytes: 1 %
Lymphocytes Relative: 23 %
Lymphs Abs: 1.5 10*3/uL (ref 0.7–4.0)
MCH: 34.8 pg — ABNORMAL HIGH (ref 26.0–34.0)
MCHC: 34.8 g/dL (ref 30.0–36.0)
MCV: 100 fL (ref 80.0–100.0)
Monocytes Absolute: 0.8 10*3/uL (ref 0.1–1.0)
Monocytes Relative: 13 %
Neutro Abs: 3.9 10*3/uL (ref 1.7–7.7)
Neutrophils Relative %: 59 %
Platelet Count: 150 10*3/uL (ref 150–400)
RBC: 4 MIL/uL — ABNORMAL LOW (ref 4.22–5.81)
RDW: 13.5 % (ref 11.5–15.5)
WBC Count: 6.5 10*3/uL (ref 4.0–10.5)
nRBC: 0 % (ref 0.0–0.2)

## 2023-02-14 MED ORDER — SODIUM CHLORIDE 0.9 % IV SOLN
1000.0000 mg/m2 | Freq: Once | INTRAVENOUS | Status: AC
  Administered 2023-02-14: 2052 mg via INTRAVENOUS
  Filled 2023-02-14: qty 53.97

## 2023-02-14 MED ORDER — SODIUM CHLORIDE 0.9 % IV SOLN: Freq: Once | INTRAVENOUS | Status: AC

## 2023-02-14 MED ORDER — SODIUM CHLORIDE 0.9% FLUSH
10.0000 mL | INTRAVENOUS | Status: DC | PRN
  Administered 2023-02-14: 10 mL

## 2023-02-14 MED ORDER — SODIUM CHLORIDE 0.9% FLUSH
10.0000 mL | Freq: Once | INTRAVENOUS | Status: AC
  Administered 2023-02-14: 10 mL

## 2023-02-14 MED ORDER — HEPARIN SOD (PORK) LOCK FLUSH 100 UNIT/ML IV SOLN
500.0000 [IU] | Freq: Once | INTRAVENOUS | Status: AC | PRN
  Administered 2023-02-14: 500 [IU]

## 2023-02-14 MED ORDER — PROCHLORPERAZINE MALEATE 10 MG PO TABS
10.0000 mg | ORAL_TABLET | Freq: Once | ORAL | Status: AC
  Administered 2023-02-14: 10 mg via ORAL
  Filled 2023-02-14: qty 1

## 2023-02-14 MED ORDER — ALPRAZOLAM 0.25 MG PO TABS: 0.2500 mg | ORAL_TABLET | Freq: Two times a day (BID) | ORAL | 0 refills | Status: DC | PRN

## 2023-02-14 MED ORDER — PACLITAXEL PROTEIN-BOUND CHEMO INJECTION 100 MG
100.0000 mg/m2 | Freq: Once | INTRAVENOUS | Status: AC
  Administered 2023-02-14: 200 mg via INTRAVENOUS
  Filled 2023-02-14: qty 40

## 2023-02-14 NOTE — Progress Notes (Signed)
[place]   [phone] [phone]   Clinic Follow up Note   Patient Care Team: [name], MD as PCP - General (Internal Medicine) [name], NP as PCP - Hematology/Oncology (Nurse Practitioner) [name], DO as PCP - Cardiology (Cardiology) [name], MD as Consulting Physician (Orthopedic Surgery) [name], MD as Consulting Physician (Cardiology) [name], MD as Consulting Physician (Oncology)  Date of Service:  02/14/2023  CHIEF COMPLAINT: f/u of metastatic pancreatic cancer      CURRENT THERAPY:  Gemcitabine and Abraxane q28d    SUMMARY OF ONCOLOGIC HISTORY: Oncology History Overview Note   Cancer Staging  Pancreatic adenocarcinoma [facility]) Staging form: Exocrine Pancreas, AJCC 8th Edition - Clinical stage from 07/26/2021: Stage IV (cT4, cN0, cM1) - Signed by [name], MD on 07/28/2021    Pancreatic adenocarcinoma (Zachary Reid)  07/02/2021 Initial Diagnosis   Pancreatic adenocarcinoma ([place])   07/26/2021 Cancer Staging   Staging form: Exocrine Pancreas, AJCC 8th Edition - Clinical stage from 07/26/2021: Stage IV (cT4, cN0, cM1) - Signed by [name], MD on 07/28/2021 Stage prefix: Initial diagnosis Total positive nodes: 0   08/13/2021 - 07/06/2022 Chemotherapy   Patient is on Treatment Plan : PANCREAS Modified FOLFIRINOX q14d x 4 cycles      Genetic Testing   [facility] CancerNext-Expanded results (77 genes) were negative. No pathogenic variants were identified. 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 The report date is 08/15/2021.    The CancerNext-Expanded gene panel offered by [facility] and includes sequencing, rearrangement, and RNA analysis for the following 77 genes: AIP, ALK, APC, ATM, AXIN2, BAP1, BARD1, BLM, BMPR1A, BRCA1, BRCA2, BRIP1, CDC73, CDH1, CDK4, CDKN1B, CDKN2A, CHEK2, CTNNA1, DICER1, FANCC, FH, FLCN, GALNT12, KIF1B, LZTR1, MAX, MEN1, MET, MLH1, MSH2, MSH3, MSH6, MUTYH,  NBN, NF1, NF2, NTHL1, PALB2, PHOX2B, PMS2, POT1, PRKAR1A, PTCH1, PTEN, RAD51C, RAD51D, RB1, RECQL, RET, SDHA, SDHAF2, SDHB, SDHC, SDHD, SMAD4, SMARCA4, SMARCB1, SMARCE1, STK11, SUFU, TMEM127, TP53, TSC1, TSC2, VHL and XRCC2 (sequencing and deletion/duplication); EGFR, EGLN1, HOXB13, KIT, MITF, PDGFRA, POLD1, and POLE (sequencing only); EPCAM and GREM1 (deletion/duplication only).     08/13/2021 - 12/06/2022 Chemotherapy   Patient is on Treatment Plan : PANCREAS Modified FOLFIRINOX q14d x 4 cycles     10/25/2021 Imaging   EXAM: CT CHEST, ABDOMEN, AND PELVIS WITH CONTRAST  IMPRESSION: 1. Innumerable bilateral pulmonary nodules, many of which are cavitary, consistent with metastatic disease. These nodules show no substantial change in are minimally progressed in the interval. 2. Mix cystic and solid lesion in the head and body of the pancreas is similar to prior and also comparing back to MRI 07/02/2021. 3. Hepatic cysts. 4. 8 mm nonobstructing left renal stone. 5. Aortic Atherosclerosis (ICD10-I70.0).   01/28/2022 Imaging   EXAM: CT CHEST, ABDOMEN, AND PELVIS WITH CONTRAST  IMPRESSION: 1. Innumerable bilateral small solid and cavitary pulmonary nodules, some of the cavitary nodules are slightly less thick-walled when compared with prior exam. 2. Mixed cystic and solid lesion in the head and body of the pancreas is similar to prior exam. 3. Nonobstructing left renal stone. 4.  Aortic Atherosclerosis (ICD10-I70.0).   11/29/2022 Imaging    IMPRESSION: 1. No significant change in primary pancreatic mass and adjacent cystic component. Unchanged appearance of soft tissue extending to involve the adjacent celiac axis, superior mesenteric artery origin, and portal confluence. 2. 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 3. Innumerable  small pulmonary nodules throughout the lungs, the majority of which are cavitary.  Although interval change is difficult to appreciate for the majority of these nodules due to small size, at least some of these are slightly enlarged, consistent with worsened pulmonary metastatic disease. 4. Newly enlarged left retroperitoneal lymph nodes, which may reflect nodal metastatic disease or perhaps reactive to left hydronephrosis. Attention on follow-up. 5. A sizable calculus previously seen in the left renal pelvis has migrated to the middle third of the left ureter, with placement of a double-J left ureteral stent, with formed pigtails in the left renal pelvis and urinary bladder. Moderate left hydronephrosis and proximal hydroureter. 6. Coronary artery disease.   Aortic Atherosclerosis (ICD10-I70.0).   01/02/2023 -  Chemotherapy   Patient is on Treatment Plan : PANCREATIC Abraxane D1,8,15 + Gemcitabine D1,8,15 q28d        INTERVAL HISTORY:  Zachary Reid is here for a follow up of metastatic pancreatic cancer. He was last seen by Dr. Burr Medico on 01/30/2023. He is unaccompanied for this visit.   Zachary Reid reports he is tolerating chemotherapy without significant limitations. He does have fatigue after chemotherapy but is able to complete his ADLs on his own. He has a good appetite without weight loss noted. He has nausea for approximately one day after treatment without vomiting. He does have mild epigastric discomfort that resolves with pepcid. He denies bowel habit changes including recurrent episodes of diarrhea or constipation. He denies easy bruising or signs of active bleeding. 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 He denies any interference with grip or dexterity. He denies fevers, chills, sweats, shortness of breath, chest pain or cough. He has no other complaints.   All other systems were reviewed with the patient and are negative.  MEDICAL HISTORY:  Past Medical History:  Diagnosis Date   Adenocarcinoma of  pancreas, stage 4 ([place]) 07/2021   oncologist--- dr Burr Medico;   mets to  bilateral lung;  started chemo 08-13-2021   Anemia    GAD (generalized anxiety disorder)    History of adenomatous polyp of colon    Hyperlipidemia    Hypogonadism male    IBS (irritable bowel syndrome)    Insomnia    Left ureteral calculus    Metastatic cancer to lung ([place]) 07/2021   primary pancreatic cancer w/ numerous small cavity lesions bilateral lungs   PAF (paroxysmal atrial fibrillation) ([place]) 10/31/2022   cardiologist-- dr Raliegh Ip. tobb;  newly dx ED 10-31-2022 w/ RVR  in setting flank pain (kidney stone)  --  office note in epic 11-07-2022 normal ETT 11-13-2022, echo 11-07-2022 ef 65-70% w/ mild LVH, mild MR,  zio monitor not completed,  started on toprol and xarelto daily   Personal history of chemotherapy    Receiving chemotherapy for metastatic pancreatic cancer. Most recent infusion (as of 12/02/22) was on 11/15/22.   Port-A-Cath in place 08/10/2021   Type 2 diabetes mellitus treated with insulin [facility])    endocrinologist--- dr Dwyane Dee   Vitamin D deficiency    takes Vit D supplements   Wears contact lenses     SURGICAL HISTORY: Past Surgical History:  Procedure Laterality Date   APPENDECTOMY  1988   BIOPSY  07/26/2021   Procedure: BIOPSY;  Surgeon: Irving Copas., MD;  Location: [place];  Service: Gastroenterology;;   COLONOSCOPY WITH PROPOFOL N/A 07/26/2021   Procedure: COLONOSCOPY WITH PROPOFOL;  Surgeon: Irving Copas., MD;  Location: [place];  Service: Gastroenterology;  Laterality: N/A;   CYSTOSCOPY WITH STENT PLACEMENT Left 10/31/2022   Procedure: CYSTOSCOPY WITH STENT PLACEMENT;  Surgeon: [name], MD;  Location: [facility];  Service: Urology;  Laterality: Left;   CYSTOSCOPY/URETEROSCOPY/HOLMIUM LASER/STENT PLACEMENT Left 12/03/2022   Procedure: CYSTOSCOPY/LEFT URETEROSCOPY/HOLMIUM LASER/STENT EXCHANGE;  Surgeon: [name], MD;  Location: [facility];  Service: Urology;  Laterality: Left;  1 HR FOR CASE   ESOPHAGOGASTRODUODENOSCOPY (EGD) WITH PROPOFOL N/A 07/26/2021   Procedure: ESOPHAGOGASTRODUODENOSCOPY (EGD) WITH PROPOFOL;  Surgeon: Rush Landmark Telford Nab., MD;  Location: [place];  Service: Gastroenterology;  Laterality: N/A;   EUS N/A 07/26/2021   Procedure: UPPER ENDOSCOPIC ULTRASOUND (EUS) RADIAL;  Surgeon: Irving Copas., MD;  Location: [place];  Service: Gastroenterology;  Laterality: N/A;   FINE NEEDLE ASPIRATION  07/26/2021   Procedure: FINE NEEDLE ASPIRATION (FNA) LINEAR;  Surgeon: Rush Landmark Telford Nab., MD;  Location: [place];  Service: Gastroenterology;;   IR IMAGING GUIDED PORT INSERTION  08/10/2021   POLYPECTOMY  07/26/2021   Procedure: POLYPECTOMY;  Surgeon: Rush Landmark Telford Nab., MD;  Location: [place];  Service: Gastroenterology;;   SHOULDER SURGERY Right    as a teenager    I have reviewed the social history and family history with the patient and they are unchanged from previous note.  ALLERGIES:  is allergic to lipitor [atorvastatin].  MEDICATIONS:  Current Outpatient Medications  Medication Sig Dispense Refill   ALPRAZolam (XANAX) 0.25 MG tablet Take 1-2 tablets (0.25-0.5 mg total) by mouth 2 (two) times daily as needed for anxiety. 60 tablet 0   Cholecalciferol (VITAMIN D3) 125 MCG (5000 UT) CAPS Take 5,000 Units by mouth in the morning and at bedtime.     Continuous Blood Gluc Sensor (DEXCOM G7 SENSOR) MISC 1 Device by Does not apply route as directed. Change sensor every 10 days (Patient taking differently: 1 Device by Does not apply route as directed. Change sensor every 10 days Per patient on 12/02/22, he has not yet received glucose monitor.) 3 each 3   gabapentin (NEURONTIN) 600 MG tablet Take 1 tablet (600 mg total) by mouth 3 (three) times daily as needed (pain). 60 tablet 1   glucose blood test strip Use as instructed 100 each 12   HYDROcodone-acetaminophen  (NORCO/VICODIN) 5-325 MG tablet Take 1 tablet by mouth every 4 (four) hours as needed for moderate pain. (Patient not taking: Reported on 12/02/2022) 20 tablet 0   hyoscyamine (LEVSIN SL) 0.125 MG SL tablet DISSOLVE ONE TABLET UNDER THE TONGUE 4 TIMES DAILY UP TO EVERY 4 HOURS AS NEEDED FOR NAUSEA, BLOATING, CRAMPING, OR DIARRHEA (Patient not taking: Reported on 12/02/2022) 100 tablet 0   insulin degludec (TRESIBA FLEXTOUCH) 100 UNIT/ML FlexTouch Pen Inject 32 Units into the skin daily. 15 mL 1   insulin lispro (HUMALOG KWIKPEN) 100 UNIT/ML KwikPen 10 TO 15 UNITS BEFORE MEALS AS DIRECTED (Patient taking differently: Inject 10-12 Units into the skin 3 (three) times daily before meals.) 15 mL 1   metoprolol succinate (TOPROL XL) 25 MG 24 hr tablet Take 0.5 tablets (12.5 mg total) by mouth daily. (Patient taking differently: Take 12.5 mg by mouth daily. Per patient, he will start taking Metoprolol on 12/02/22 and take again on the day of surgery (12/03/22.)) 45 tablet 3   oxyCODONE (ROXICODONE) 5 MG immediate release tablet Take 1 tablet (5 mg total) by mouth every 6 (six) hours as needed for up to 30 doses for severe pain. 30 tablet 0   prochlorperazine (COMPAZINE) 10 MG tablet  Take 1 tablet (10 mg total) by mouth every 6 (six) hours as needed (Nausea or vomiting). 60 tablet 1   rivaroxaban (XARELTO) 20 MG TABS tablet Take 1 tablet (20 mg total) by mouth daily with supper. (Patient taking differently: Take 20 mg by mouth daily with supper. Patient stated that he last took Xarelto on 11/30/22.) 90 tablet 3   traMADol (ULTRAM) 50 MG tablet Take 1-2 tablets (50-100 mg total) by mouth every 6 (six) hours as needed. 120 tablet 0   zolpidem (AMBIEN) 10 MG tablet Take 1 tablet (10 mg total) by mouth at bedtime as needed for sleep. 30 tablet 0   No current facility-administered medications for this visit.    PHYSICAL EXAMINATION: ECOG PERFORMANCE STATUS: 1 - Symptomatic but completely ambulatory  Vitals:    02/14/23 1003  BP: 102/60  Pulse: (!) 52  Resp: 20  Temp: 97.7 F (36.5 C)  SpO2: 100%   Wt Readings from Last 3 Encounters:  02/14/23 192 lb 12.8 oz (87.5 kg)  01/30/23 188 lb 14.4 oz (85.7 kg)  01/17/23 191 lb 3.2 oz (86.7 kg)     Constitutional: Oriented to person, place, and time and well-developed, well-nourished, and in no distress.  HENT:  Head: Normocephalic and atraumatic.  Eyes: Conjunctivae are normal. Right eye exhibits no discharge. Left eye exhibits no discharge. No scleral icterus.  Cardiovascular: Normal rate, regular rhythm, normal heart sounds   Pulmonary/Chest: Effort normal and breath sounds normal. No respiratory distress. No wheezes. No rales.  Musculoskeletal: Normal range of motion. Exhibits no edema.  Neurological: Alert and oriented to person, place, and time. Exhibits normal muscle tone. Gait normal. Coordination normal.  Skin: Skin is warm and dry. No rash noted. Not diaphoretic. No erythema. No pallor.  Psychiatric: Mood, memory and judgment normal.     LABORATORY DATA:  I have reviewed the data as listed    Latest Ref Rng & Units 02/14/2023    9:30 AM 01/30/2023    8:08 AM 01/17/2023    9:26 AM  CBC  WBC 4.0 - 10.5 K/uL 6.5  6.8  5.2   Hemoglobin 13.0 - 17.0 g/dL 13.9  13.9  11.3   Hematocrit 39.0 - 52.0 % 40.0  39.8  32.0   Platelets 150 - 400 K/uL 150  116  123         Latest Ref Rng & Units 02/14/2023    9:30 AM 01/30/2023    8:08 AM 01/17/2023    9:26 AM  CMP  Glucose 70 - 99 mg/dL 178  169  143   BUN 8 - 23 mg/dL 21  19  16    Creatinine 0.61 - 1.24 mg/dL 1.02  1.01  0.87   Sodium 135 - 145 mmol/L 140  141  139   Potassium 3.5 - 5.1 mmol/L 4.1  4.1  3.7   Chloride 98 - 111 mmol/L 108  108  109   CO2 22 - 32 mmol/L 26  26  24    Calcium 8.9 - 10.3 mg/dL 9.2  9.2  8.7   Total Protein 6.5 - 8.1 g/dL 7.2  7.3  6.8   Total Bilirubin 0.3 - 1.2 mg/dL 0.4  0.4  0.5   Alkaline Phos 38 - 126 U/L 111  111  96   AST 15 - 41 U/L 20  23  28    ALT  0 - 44 U/L 23  25  25        RADIOGRAPHIC STUDIES: I have  personally reviewed the radiological images as listed and agreed with the findings in the report. No results found.    ASSESSMENT:  Zachary Reid is a 67 y.o. male with   Pancreatic adenocarcinoma ([place]) [phone] with numerous small cavitary lesions in bilateral lungs, MMR normal  -diagnosed 07/2021, after incidental finding on CT scan for kidney stone, by colonoscopy/EUS. -Baseline CA 19-9 elevated to 273 on 07/03/21 -He began first-line mFOLFIRINOX on 08/13/21, but required dose reduction and move to every 3 weeks. -restaging CT CAP 08/12/22 showed: multiple new and enlarging pulmonary metastases; pancreatic mass grossly stable.  -we changed his irinotecan to liposomal on 09/05/22. He tolerated better and feels stronger. -Due to disease progression, his treatment was changed to gemcitabine and Abraxane every 2 weeks on 01/02/2023 -He tolerated the first two cycles chemotherapy very well, dose of gemcitabine was increased to 1000mg /m2 with Cycle 2, Day 1 on 01/30/2023.  PLAN: -Due for cycle 2, day 15 of gemcitabine and abraxane today -Labs from today were reviewed and adequate for treatment. No cytopenias. LFTs and creatinine normal.  -Most recent CA19-9 level from 01/30/2023 was trending up from 354 to 403.  -Proceed with treatment today without dose modifications -Sent refill for Xanax that he takes for anxiety.  -Plan to order CT scan before next visit.  -RTC on 02/28/2023 for labs and follow up with Dr.  Burr Medico before Cycle 3, Day 1.    No orders of the defined types were placed in this encounter.  All questions were answered. The patient knows to call the clinic with any problems, questions or concerns. No barriers to learning was detected.  I have spent a total of 30 minutes minutes of face-to-face and non-face-to-face time, preparing to see the [place] a medically appropriate examination, counseling and educating  the patient, ordering medications/tests/procedures, referring and communicating with other health care professionals, documenting clinical information in the electronic health record, independently interpreting results and communicating results to the patient, and care coordination.   Dede Query PA-C Dept of Hematology and [place] at [facility] Phone: [phone]

## 2023-02-14 NOTE — Patient Instructions (Signed)
[facility]  Discharge Instructions: Thank you for choosing [facility] to provide your oncology and hematology care.   If you have a lab appointment with the [facility], please go directly to the [facility] and check in at the registration area.   Wear comfortable clothing and clothing appropriate for easy access to any Portacath or PICC line.   We strive to give you quality time with your provider. You may need to reschedule your appointment if you arrive late (15 or more minutes).  Arriving late affects you and other patients whose appointments are after yours.  Also, if you miss three or more appointments without notifying the office, you may be dismissed from the clinic at the provider's discretion.      For prescription refill requests, have your pharmacy contact our office and allow 72 hours for refills to be completed.    Today you received the following chemotherapy and/or immunotherapy agents: Abraxane and Gemzar      To help prevent nausea and vomiting after your treatment, we encourage you to take your nausea medication as directed.  BELOW ARE SYMPTOMS THAT SHOULD BE REPORTED IMMEDIATELY: *FEVER GREATER THAN 100.4 F (38 C) OR HIGHER *CHILLS OR SWEATING *NAUSEA AND VOMITING THAT IS NOT CONTROLLED WITH YOUR NAUSEA MEDICATION *UNUSUAL SHORTNESS OF BREATH *UNUSUAL BRUISING OR BLEEDING *URINARY PROBLEMS (pain or burning when urinating, or frequent urination) *BOWEL PROBLEMS (unusual diarrhea, constipation, pain near the anus) TENDERNESS IN MOUTH AND THROAT WITH OR WITHOUT PRESENCE OF ULCERS (sore throat, sores in mouth, or a toothache) UNUSUAL RASH, SWELLING OR PAIN  UNUSUAL VAGINAL DISCHARGE OR ITCHING   Items with * indicate a potential emergency and should be followed up as soon as possible or go to the Emergency Department if any problems should occur.  Please show the CHEMOTHERAPY ALERT CARD or IMMUNOTHERAPY ALERT  CARD at check-in to the Emergency Department and triage nurse.  Should you have questions after your visit or need to cancel or reschedule your appointment, please contact [facility]  Dept: [phone]  and follow the prompts.  Office hours are 8:00 a.m. to 4:30 p.m. Monday - Friday. Please note that voicemails left after 4:00 p.m. may not be returned until the following business day.  We are closed weekends and major holidays. You have access to a nurse at all times for urgent questions. Please call the main number to the clinic Dept: [phone] and follow the prompts.   For any non-urgent questions, you may also contact your provider using MyChart. We now offer e-Visits for anyone 15 and older to request care online for non-urgent symptoms. For details visit [email or web address].   Also download the MyChart app! Go to the app store, search "MyChart", open the app, select [facility], and log in with your MyChart username and password.

## 2023-02-17 ENCOUNTER — Ambulatory Visit (HOSPITAL_COMMUNITY): Payer: Medicare Other | Attending: Physician Assistant

## 2023-02-18 ENCOUNTER — Other Ambulatory Visit: Payer: Self-pay

## 2023-02-21 ENCOUNTER — Other Ambulatory Visit: Payer: Self-pay | Admitting: Hematology

## 2023-02-21 ENCOUNTER — Other Ambulatory Visit: Payer: Self-pay

## 2023-02-21 DIAGNOSIS — C259 Malignant neoplasm of pancreas, unspecified: Secondary | ICD-10-CM

## 2023-02-26 ENCOUNTER — Encounter: Payer: Self-pay | Admitting: Hematology

## 2023-02-27 ENCOUNTER — Ambulatory Visit: Payer: Medicare Other

## 2023-02-27 ENCOUNTER — Ambulatory Visit: Payer: Medicare Other | Admitting: Hematology

## 2023-02-27 ENCOUNTER — Other Ambulatory Visit: Payer: Medicare Other

## 2023-02-27 DIAGNOSIS — Z7189 Other specified counseling: Secondary | ICD-10-CM | POA: Insufficient documentation

## 2023-02-27 DIAGNOSIS — F419 Anxiety disorder, unspecified: Secondary | ICD-10-CM | POA: Insufficient documentation

## 2023-02-27 NOTE — Assessment & Plan Note (Addendum)
[id number] with numerous small cavitary lesions in bilateral lungs, MMR normal  -diagnosed 07/2021, after incidental finding on CT scan for kidney stone, by colonoscopy/EUS. -Baseline CA 19-9 elevated to 273 on 07/03/21 -He began first-line mFOLFIRINOX on 08/13/21, but required dose reduction and move to every 3 weeks. -his CA 19-9 trended up but is now stable. -restaging CT CAP 08/12/22 showed: multiple new and enlarging pulmonary metastases; pancreatic mass grossly stable. Will repeat in 3 months. -we changed his irinotecan to liposomal on 09/05/22. He tolerated better and feels stronger. -Due to disease progression, his treatment was changed to gemcitabine and Abraxane every 2 weeks on 01/02/2023 -He tolerated the first two cycles chemotherapy very well with mild fatigue.  I increased gemcitabine to 1000mg /m2 from cycle 3 -he is scheduled for restaging CT on 4/24

## 2023-02-27 NOTE — Assessment & Plan Note (Signed)
We again discussed the incurable nature of his cancer, and the overall poor prognosis, especially if he does not have good response to chemotherapy or progress on chemo -The patient understands the goal of care is palliative. -I previously discussed code status and recommended DNR/DNI which he agreed. But he wants life support if his condition is not related to cancer, especially during procedure, surgery etc. I documented

## 2023-02-27 NOTE — Assessment & Plan Note (Signed)
-  On Xanax as needed  -He has had counseling -Denies depression.  -I encouraged him to cut his walking hours, to balance his work and quality of life

## 2023-02-27 NOTE — Assessment & Plan Note (Signed)
-  continue meds -f/u with PCP

## 2023-02-28 ENCOUNTER — Encounter: Payer: Self-pay | Admitting: Hematology

## 2023-02-28 ENCOUNTER — Other Ambulatory Visit: Payer: Self-pay

## 2023-02-28 ENCOUNTER — Inpatient Hospital Stay (HOSPITAL_BASED_OUTPATIENT_CLINIC_OR_DEPARTMENT_OTHER): Payer: Medicare Other | Admitting: Hematology

## 2023-02-28 ENCOUNTER — Inpatient Hospital Stay: Payer: Medicare Other | Attending: Hematology

## 2023-02-28 ENCOUNTER — Inpatient Hospital Stay: Payer: Medicare Other

## 2023-02-28 VITALS — BP 108/68 | HR 65 | Temp 97.9°F | Resp 18 | Ht 71.0 in | Wt 198.3 lb

## 2023-02-28 DIAGNOSIS — F411 Generalized anxiety disorder: Secondary | ICD-10-CM | POA: Diagnosis not present

## 2023-02-28 DIAGNOSIS — C259 Malignant neoplasm of pancreas, unspecified: Secondary | ICD-10-CM

## 2023-02-28 DIAGNOSIS — N132 Hydronephrosis with renal and ureteral calculous obstruction: Secondary | ICD-10-CM | POA: Insufficient documentation

## 2023-02-28 DIAGNOSIS — R590 Localized enlarged lymph nodes: Secondary | ICD-10-CM | POA: Diagnosis not present

## 2023-02-28 DIAGNOSIS — Z7901 Long term (current) use of anticoagulants: Secondary | ICD-10-CM | POA: Diagnosis not present

## 2023-02-28 DIAGNOSIS — E1122 Type 2 diabetes mellitus with diabetic chronic kidney disease: Secondary | ICD-10-CM

## 2023-02-28 DIAGNOSIS — E119 Type 2 diabetes mellitus without complications: Secondary | ICD-10-CM | POA: Insufficient documentation

## 2023-02-28 DIAGNOSIS — Z7189 Other specified counseling: Secondary | ICD-10-CM | POA: Diagnosis not present

## 2023-02-28 DIAGNOSIS — C250 Malignant neoplasm of head of pancreas: Secondary | ICD-10-CM | POA: Diagnosis not present

## 2023-02-28 DIAGNOSIS — G4700 Insomnia, unspecified: Secondary | ICD-10-CM | POA: Diagnosis not present

## 2023-02-28 DIAGNOSIS — I82890 Acute embolism and thrombosis of other specified veins: Secondary | ICD-10-CM | POA: Insufficient documentation

## 2023-02-28 DIAGNOSIS — Z794 Long term (current) use of insulin: Secondary | ICD-10-CM

## 2023-02-28 DIAGNOSIS — I2510 Atherosclerotic heart disease of native coronary artery without angina pectoris: Secondary | ICD-10-CM | POA: Diagnosis not present

## 2023-02-28 DIAGNOSIS — R5383 Other fatigue: Secondary | ICD-10-CM | POA: Diagnosis not present

## 2023-02-28 DIAGNOSIS — Z95828 Presence of other vascular implants and grafts: Secondary | ICD-10-CM

## 2023-02-28 DIAGNOSIS — I480 Paroxysmal atrial fibrillation: Secondary | ICD-10-CM | POA: Insufficient documentation

## 2023-02-28 DIAGNOSIS — E785 Hyperlipidemia, unspecified: Secondary | ICD-10-CM | POA: Diagnosis not present

## 2023-02-28 DIAGNOSIS — K7689 Other specified diseases of liver: Secondary | ICD-10-CM | POA: Diagnosis not present

## 2023-02-28 DIAGNOSIS — F419 Anxiety disorder, unspecified: Secondary | ICD-10-CM

## 2023-02-28 DIAGNOSIS — Z5111 Encounter for antineoplastic chemotherapy: Secondary | ICD-10-CM | POA: Insufficient documentation

## 2023-02-28 DIAGNOSIS — C7800 Secondary malignant neoplasm of unspecified lung: Secondary | ICD-10-CM | POA: Diagnosis not present

## 2023-02-28 DIAGNOSIS — Z79899 Other long term (current) drug therapy: Secondary | ICD-10-CM | POA: Diagnosis not present

## 2023-02-28 DIAGNOSIS — I700 Atherosclerosis of aorta: Secondary | ICD-10-CM | POA: Diagnosis not present

## 2023-02-28 DIAGNOSIS — E860 Dehydration: Secondary | ICD-10-CM

## 2023-02-28 LAB — CBC WITH DIFFERENTIAL (CANCER CENTER ONLY)
Abs Immature Granulocytes: 0.05 10*3/uL (ref 0.00–0.07)
Basophils Absolute: 0 10*3/uL (ref 0.0–0.1)
Basophils Relative: 1 %
Eosinophils Absolute: 0.4 10*3/uL (ref 0.0–0.5)
Eosinophils Relative: 7 %
HCT: 33.2 % — ABNORMAL LOW (ref 39.0–52.0)
Hemoglobin: 11.6 g/dL — ABNORMAL LOW (ref 13.0–17.0)
Immature Granulocytes: 1 %
Lymphocytes Relative: 24 %
Lymphs Abs: 1.4 10*3/uL (ref 0.7–4.0)
MCH: 34.8 pg — ABNORMAL HIGH (ref 26.0–34.0)
MCHC: 34.9 g/dL (ref 30.0–36.0)
MCV: 99.7 fL (ref 80.0–100.0)
Monocytes Absolute: 0.8 10*3/uL (ref 0.1–1.0)
Monocytes Relative: 13 %
Neutro Abs: 3.2 10*3/uL (ref 1.7–7.7)
Neutrophils Relative %: 54 %
Platelet Count: 95 10*3/uL — ABNORMAL LOW (ref 150–400)
RBC: 3.33 MIL/uL — ABNORMAL LOW (ref 4.22–5.81)
RDW: 13.2 % (ref 11.5–15.5)
WBC Count: 5.8 10*3/uL (ref 4.0–10.5)
nRBC: 0 % (ref 0.0–0.2)

## 2023-02-28 LAB — CMP (CANCER CENTER ONLY)
ALT: 22 U/L (ref 0–44)
AST: 21 U/L (ref 15–41)
Albumin: 3.9 g/dL (ref 3.5–5.0)
Alkaline Phosphatase: 98 U/L (ref 38–126)
Anion gap: 5 (ref 5–15)
BUN: 14 mg/dL (ref 8–23)
CO2: 28 mmol/L (ref 22–32)
Calcium: 8.9 mg/dL (ref 8.9–10.3)
Chloride: 108 mmol/L (ref 98–111)
Creatinine: 0.97 mg/dL (ref 0.61–1.24)
GFR, Estimated: >60 mL/min (ref >60)
Glucose, Bld: 152 mg/dL — ABNORMAL HIGH (ref 70–99)
Potassium: 3.7 mmol/L (ref 3.5–5.1)
Sodium: 141 mmol/L (ref 135–145)
Total Bilirubin: 0.3 mg/dL (ref 0.3–1.2)
Total Protein: 6.6 g/dL (ref 6.5–8.1)

## 2023-02-28 MED ORDER — SODIUM CHLORIDE 0.9 % IV SOLN: Freq: Once | INTRAVENOUS | Status: AC

## 2023-02-28 MED ORDER — SODIUM CHLORIDE 0.9% FLUSH
10.0000 mL | Freq: Once | INTRAVENOUS | Status: AC
  Administered 2023-02-28: 10 mL

## 2023-02-28 MED ORDER — PACLITAXEL PROTEIN-BOUND CHEMO INJECTION 100 MG
100.0000 mg/m2 | Freq: Once | INTRAVENOUS | Status: AC
  Administered 2023-02-28: 200 mg via INTRAVENOUS
  Filled 2023-02-28: qty 40

## 2023-02-28 MED ORDER — HEPARIN SOD (PORK) LOCK FLUSH 100 UNIT/ML IV SOLN
500.0000 [IU] | Freq: Once | INTRAVENOUS | Status: AC | PRN
  Administered 2023-02-28: 500 [IU]

## 2023-02-28 MED ORDER — PROCHLORPERAZINE MALEATE 10 MG PO TABS
10.0000 mg | ORAL_TABLET | Freq: Once | ORAL | Status: AC
  Administered 2023-02-28: 10 mg via ORAL
  Filled 2023-02-28: qty 1

## 2023-02-28 MED ORDER — SODIUM CHLORIDE 0.9 % IV SOLN
1000.0000 mg/m2 | Freq: Once | INTRAVENOUS | Status: AC
  Administered 2023-02-28: 2052 mg via INTRAVENOUS
  Filled 2023-02-28: qty 53.97

## 2023-02-28 MED ORDER — SODIUM CHLORIDE 0.9% FLUSH
10.0000 mL | INTRAVENOUS | Status: DC | PRN
  Administered 2023-02-28: 10 mL

## 2023-02-28 NOTE — Progress Notes (Signed)
[facility]   [phone] [phone]   Clinic Follow up Note   Patient Care Team: Lucky Cowboy, MD as PCP - General (Internal Medicine) Pollyann Samples, NP as PCP - Hematology/Oncology (Nurse Practitioner) Thomasene Ripple, DO as PCP - Cardiology (Cardiology) Nadara Mustard, MD as Consulting Physician (Orthopedic Surgery) Laurey Morale, MD as Consulting Physician (Cardiology) Malachy Mood, MD as Consulting Physician (Oncology)  Date of Service:  02/28/2023  CHIEF COMPLAINT: f/u of metastatic pancreatic cancer   CURRENT THERAPY:   Gemcitabine and Abraxane q28d   ASSESSMENT:  Zachary Reid is a 67 y.o. male with   Pancreatic adenocarcinoma (HCC) [phone] with numerous small cavitary lesions in bilateral lungs, MMR normal  -diagnosed 07/2021, after incidental finding on CT scan for kidney stone, by colonoscopy/EUS. -Baseline CA 19-9 elevated to 273 on 07/03/21 -He began first-line mFOLFIRINOX on 08/13/21, but required dose reduction and move to every 3 weeks. -his CA 19-9 trended up but is now stable. -restaging CT CAP 08/12/22 showed: multiple new and enlarging pulmonary metastases; pancreatic mass grossly stable. Will repeat in 3 months. -we changed his irinotecan to liposomal on 09/05/22. He tolerated better and feels stronger. -Due to disease progression, his treatment was changed to gemcitabine and Abraxane every 2 weeks on 01/02/2023 -He tolerated the first two cycles chemotherapy very well with mild fatigue.  I increased gemcitabine to 1000mg /m2 from cycle 3 -he is scheduled for restaging CT on 4/24   Diabetes (HCC) -continue meds -f/u with PCP   Goals of care, counseling/discussion We again discussed the incurable nature of his cancer, and the overall poor prognosis, especially if he does not have good response to chemotherapy or progress on chemo -The patient understands the goal of care is palliative. -I previously discussed code status  and recommended DNR/DNI which he agreed. But he wants life support if his condition is not related to cancer, especially during procedure, surgery etc. I documented     Anxiety -On Xanax as needed  -He has had counseling -Denies depression.  -I encouraged him to cut his walking hours, to balance his work and quality of life       PLAN: -lab reviewed  -platelet count 95K, OK to treat  -proceed with Gemcitabine and abraxane same dosage today -CT schedule for 4/24 -lab/flush and gem/abraxane 4/26      SUMMARY OF ONCOLOGIC HISTORY: Oncology History Overview Note   Cancer Staging  Pancreatic adenocarcinoma [facility]) Staging form: Exocrine Pancreas, AJCC 8th Edition - Clinical stage from 07/26/2021: Stage IV (cT4, cN0, cM1) - Signed by Malachy Mood, MD on 07/28/2021    Pancreatic adenocarcinoma  07/02/2021 Initial Diagnosis   Pancreatic adenocarcinoma (HCC)   07/26/2021 Cancer Staging   Staging form: Exocrine Pancreas, AJCC 8th Edition - Clinical stage from 07/26/2021: Stage IV (cT4, cN0, cM1) - Signed by Malachy Mood, MD on 07/28/2021 Stage prefix: Initial diagnosis Total positive nodes: 0   08/13/2021 - 07/06/2022 Chemotherapy   Patient is on Treatment Plan : PANCREAS Modified FOLFIRINOX q14d x 4 cycles      Genetic Testing   [facility] CancerNext-Expanded results (77 genes) were negative. No pathogenic variants were identified. 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 The report date is 08/15/2021.    The CancerNext-Expanded gene panel offered by [facility] and includes sequencing, rearrangement, and RNA analysis for the following 77 genes: AIP, ALK, APC, ATM, AXIN2, BAP1, BARD1, BLM, BMPR1A, BRCA1, BRCA2, BRIP1, CDC73, CDH1, CDK4, CDKN1B, CDKN2A,  CHEK2, CTNNA1, DICER1, FANCC, FH, FLCN, GALNT12, KIF1B, LZTR1, MAX, MEN1, MET, MLH1, MSH2, MSH3, MSH6, MUTYH, NBN, NF1, NF2, NTHL1, PALB2, PHOX2B, PMS2, POT1, PRKAR1A, PTCH1, PTEN, RAD51C, RAD51D, RB1, RECQL, RET,  SDHA, SDHAF2, SDHB, SDHC, SDHD, SMAD4, SMARCA4, SMARCB1, SMARCE1, STK11, SUFU, TMEM127, TP53, TSC1, TSC2, VHL and XRCC2 (sequencing and deletion/duplication); EGFR, EGLN1, HOXB13, KIT, MITF, PDGFRA, POLD1, and POLE (sequencing only); EPCAM and GREM1 (deletion/duplication only).     08/13/2021 - 12/06/2022 Chemotherapy   Patient is on Treatment Plan : PANCREAS Modified FOLFIRINOX q14d x 4 cycles     10/25/2021 Imaging   EXAM: CT CHEST, ABDOMEN, AND PELVIS WITH CONTRAST  IMPRESSION: 1. Innumerable bilateral pulmonary nodules, many of which are cavitary, consistent with metastatic disease. These nodules show no substantial change in are minimally progressed in the interval. 2. Mix cystic and solid lesion in the head and body of the pancreas is similar to prior and also comparing back to MRI 07/02/2021. 3. Hepatic cysts. 4. 8 mm nonobstructing left renal stone. 5. Aortic Atherosclerosis (ICD10-I70.0).   01/28/2022 Imaging   EXAM: CT CHEST, ABDOMEN, AND PELVIS WITH CONTRAST  IMPRESSION: 1. Innumerable bilateral small solid and cavitary pulmonary nodules, some of the cavitary nodules are slightly less thick-walled when compared with prior exam. 2. Mixed cystic and solid lesion in the head and body of the pancreas is similar to prior exam. 3. Nonobstructing left renal stone. 4.  Aortic Atherosclerosis (ICD10-I70.0).   11/29/2022 Imaging    IMPRESSION: 1. No significant change in primary pancreatic mass and adjacent cystic component. Unchanged appearance of soft tissue extending to involve the adjacent celiac axis, superior mesenteric artery origin, and portal confluence. 2. 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 3. Innumerable small pulmonary nodules throughout the lungs, the majority of which are cavitary. Although interval change is difficult to appreciate for the majority of these nodules due to small size,  at least some of these are slightly enlarged, consistent with worsened pulmonary metastatic disease. 4. Newly enlarged left retroperitoneal lymph nodes, which may reflect nodal metastatic disease or perhaps reactive to left hydronephrosis. Attention on follow-up. 5. A sizable calculus previously seen in the left renal pelvis has migrated to the middle third of the left ureter, with placement of a double-J left ureteral stent, with formed pigtails in the left renal pelvis and urinary bladder. Moderate left hydronephrosis and proximal hydroureter. 6. Coronary artery disease.   Aortic Atherosclerosis (ICD10-I70.0).   01/02/2023 -  Chemotherapy   Patient is on Treatment Plan : PANCREATIC Abraxane D1,8,15 + Gemcitabine D1,8,15 q28d        INTERVAL HISTORY:  Zachary Reid is here for a follow up of metastatic pancreatic cancer . He was last seen by Rutha Bouchard on 02/14/2023. He presents to the clinic alone.Pt report that last treatment went well. He state that he hasn't notice a difference since dosage was reduce. Pt state that he had some fatigue. Pt denied having  nausea,vomiting,numbness,tingling.      All other systems were reviewed with the patient and are negative.  MEDICAL HISTORY:  Past Medical History:  Diagnosis Date   Adenocarcinoma of pancreas, stage 4 07/2021   oncologist--- dr Mosetta Putt;   mets to  bilateral lung;  started chemo 08-13-2021   Anemia    GAD (generalized anxiety disorder)    History of adenomatous polyp of colon    Hyperlipidemia    Hypogonadism male    IBS (irritable bowel syndrome)  Insomnia    Left ureteral calculus    Metastatic cancer to lung 07/2021   primary pancreatic cancer w/ numerous small cavity lesions bilateral lungs   PAF (paroxysmal atrial fibrillation) 10/31/2022   cardiologist-- dr Kirtland Bouchard. tobb;  newly dx ED 10-31-2022 w/ RVR  in setting flank pain (kidney stone)  --  office note in epic 11-07-2022 normal ETT 11-13-2022, echo  11-07-2022 ef 65-70% w/ mild LVH, mild MR,  zio monitor not completed,  started on toprol and xarelto daily   Personal history of chemotherapy    Receiving chemotherapy for metastatic pancreatic cancer. Most recent infusion (as of 12/02/22) was on 11/15/22.   Port-A-Cath in place 08/10/2021   Type 2 diabetes mellitus treated with insulin    endocrinologist--- dr Lucianne Muss   Vitamin D deficiency    takes Vit D supplements   Wears contact lenses     SURGICAL HISTORY: Past Surgical History:  Procedure Laterality Date   APPENDECTOMY  1988   BIOPSY  07/26/2021   Procedure: BIOPSY;  Surgeon: Lemar Lofty., MD;  Location: [facility] ENDOSCOPY;  Service: Gastroenterology;;   COLONOSCOPY WITH PROPOFOL N/A 07/26/2021   Procedure: COLONOSCOPY WITH PROPOFOL;  Surgeon: Lemar Lofty., MD;  Location: [facility] ENDOSCOPY;  Service: Gastroenterology;  Laterality: N/A;   CYSTOSCOPY WITH STENT PLACEMENT Left 10/31/2022   Procedure: CYSTOSCOPY WITH STENT PLACEMENT;  Surgeon: Belva Agee, MD;  Location: [facility] ORS;  Service: Urology;  Laterality: Left;   CYSTOSCOPY/URETEROSCOPY/HOLMIUM LASER/STENT PLACEMENT Left 12/03/2022   Procedure: CYSTOSCOPY/LEFT URETEROSCOPY/HOLMIUM LASER/STENT EXCHANGE;  Surgeon: Belva Agee, MD;  Location: [facility];  Service: Urology;  Laterality: Left;  1 HR FOR CASE   ESOPHAGOGASTRODUODENOSCOPY (EGD) WITH PROPOFOL N/A 07/26/2021   Procedure: ESOPHAGOGASTRODUODENOSCOPY (EGD) WITH PROPOFOL;  Surgeon: Meridee Score Netty Starring., MD;  Location: [facility] ENDOSCOPY;  Service: Gastroenterology;  Laterality: N/A;   EUS N/A 07/26/2021   Procedure: UPPER ENDOSCOPIC ULTRASOUND (EUS) RADIAL;  Surgeon: Lemar Lofty., MD;  Location: [facility] ENDOSCOPY;  Service: Gastroenterology;  Laterality: N/A;   FINE NEEDLE ASPIRATION  07/26/2021   Procedure: FINE NEEDLE ASPIRATION (FNA) LINEAR;  Surgeon: Meridee Score Netty Starring., MD;  Location: [facility] ENDOSCOPY;  Service: Gastroenterology;;    IR IMAGING GUIDED PORT INSERTION  08/10/2021   POLYPECTOMY  07/26/2021   Procedure: POLYPECTOMY;  Surgeon: Meridee Score Netty Starring., MD;  Location: [facility] ENDOSCOPY;  Service: Gastroenterology;;   SHOULDER SURGERY Right    as a teenager    I have reviewed the social history and family history with the patient and they are unchanged from previous note.  ALLERGIES:  is allergic to lipitor [atorvastatin].  MEDICATIONS:  Current Outpatient Medications  Medication Sig Dispense Refill   ALPRAZolam (XANAX) 0.25 MG tablet Take 1-2 tablets (0.25-0.5 mg total) by mouth 2 (two) times daily as needed for anxiety. 60 tablet 0   Cholecalciferol (VITAMIN D3) 125 MCG (5000 UT) CAPS Take 5,000 Units by mouth in the morning and at bedtime.     Continuous Blood Gluc Sensor (DEXCOM G7 SENSOR) MISC 1 Device by Does not apply route as directed. Change sensor every 10 days (Patient taking differently: 1 Device by Does not apply route as directed. Change sensor every 10 days Per patient on 12/02/22, he has not yet received glucose monitor.) 3 each 3   gabapentin (NEURONTIN) 600 MG tablet Take 1 tablet (600 mg total) by mouth 3 (three) times daily as needed (pain). 60 tablet 1   glucose blood test strip Use as instructed 100 each 12  HYDROcodone-acetaminophen (NORCO/VICODIN) 5-325 MG tablet Take 1 tablet by mouth every 4 (four) hours as needed for moderate pain. (Patient not taking: Reported on 12/02/2022) 20 tablet 0   hyoscyamine (LEVSIN SL) 0.125 MG SL tablet DISSOLVE ONE TABLET UNDER THE TONGUE 4 TIMES DAILY UP TO EVERY 4 HOURS AS NEEDED FOR NAUSEA, BLOATING, CRAMPING, OR DIARRHEA (Patient not taking: Reported on 12/02/2022) 100 tablet 0   insulin degludec (TRESIBA FLEXTOUCH) 100 UNIT/ML FlexTouch Pen Inject 32 Units into the skin daily. 15 mL 1   insulin lispro (HUMALOG KWIKPEN) 100 UNIT/ML KwikPen 10 TO 15 UNITS BEFORE MEALS AS DIRECTED (Patient taking differently: Inject 10-12 Units into the skin 3 (three) times daily  before meals.) 15 mL 1   metoprolol succinate (TOPROL XL) 25 MG 24 hr tablet Take 0.5 tablets (12.5 mg total) by mouth daily. (Patient taking differently: Take 12.5 mg by mouth daily. Per patient, he will start taking Metoprolol on 12/02/22 and take again on the day of surgery (12/03/22.)) 45 tablet 3   oxyCODONE (ROXICODONE) 5 MG immediate release tablet Take 1 tablet (5 mg total) by mouth every 6 (six) hours as needed for up to 30 doses for severe pain. 30 tablet 0   prochlorperazine (COMPAZINE) 10 MG tablet Take 1 tablet (10 mg total) by mouth every 6 (six) hours as needed (Nausea or vomiting). 60 tablet 1   rivaroxaban (XARELTO) 20 MG TABS tablet Take 1 tablet (20 mg total) by mouth daily with supper. (Patient taking differently: Take 20 mg by mouth daily with supper. Patient stated that he last took Xarelto on 11/30/22.) 90 tablet 3   traMADol (ULTRAM) 50 MG tablet Take 1-2 tablets (50-100 mg total) by mouth every 6 (six) hours as needed. 120 tablet 0   zolpidem (AMBIEN) 10 MG tablet TAKE 1 TABLET BY MOUTH AT BEDTIME AS NEEDED FOR SLEEP. 30 tablet 0   No current facility-administered medications for this visit.   Facility-Administered Medications Ordered in Other Visits  Medication Dose Route Frequency Provider Last Rate Last Admin   heparin lock flush 100 unit/mL  500 Units Intracatheter Once PRN Malachy Mood, MD       sodium chloride flush (NS) 0.9 % injection 10 mL  10 mL Intracatheter PRN Malachy Mood, MD        PHYSICAL EXAMINATION: ECOG PERFORMANCE STATUS: 1 - Symptomatic but completely ambulatory  Vitals:   02/28/23 0836  BP: 108/68  Pulse: 65  Resp: 18  Temp: 97.9 F (36.6 C)  SpO2: 97%   Wt Readings from Last 3 Encounters:  02/28/23 198 lb 4.8 oz (89.9 kg)  02/14/23 192 lb 12.8 oz (87.5 kg)  01/30/23 188 lb 14.4 oz (85.7 kg)     GENERAL:alert, no distress and comfortable SKIN: skin color normal, no rashes or significant lesions EYES: normal, Conjunctiva are pink and  non-injected, sclera clear  NEURO: alert & oriented x 3 with fluent speech LABORATORY DATA:  I have reviewed the data as listed    Latest Ref Rng & Units 02/28/2023    8:17 AM 02/14/2023    9:30 AM 01/30/2023    8:08 AM  CBC  WBC 4.0 - 10.5 K/uL 5.8  6.5  6.8   Hemoglobin 13.0 - 17.0 g/dL 16.1  09.6  04.5   Hematocrit 39.0 - 52.0 % 33.2  40.0  39.8   Platelets 150 - 400 K/uL 95  150  116         Latest Ref Rng & Units 02/28/2023  8:17 AM 02/14/2023    9:30 AM 01/30/2023    8:08 AM  CMP  Glucose 70 - 99 mg/dL 409  811  914   BUN 8 - 23 mg/dL Creatinine 0.61 - 1.24 mg/dL 7.82  9.56  2.13   Sodium 135 - 145 mmol/L 141  140  141   Potassium 3.5 - 5.1 mmol/L 3.7  4.1  4.1   Chloride 98 - 111 mmol/L 108  108  108   CO2 22 - 32 mmol/L Calcium 8.9 - 10.3 mg/dL 8.9  9.2  9.2   Total Protein 6.5 - 8.1 g/dL 6.6  7.2  7.3   Total Bilirubin 0.3 - 1.2 mg/dL 0.3  0.4  0.4   Alkaline Phos 38 - 126 U/L 98  111  111   AST 15 - 41 U/L ALT 0 - 44 U/L RADIOGRAPHIC STUDIES: I have personally reviewed the radiological images as listed and agreed with the findings in the report. No results found.    No orders of the defined types were placed in this encounter.  All questions were answered. The patient knows to call the clinic with any problems, questions or concerns. No barriers to learning was detected. The total time spent in the appointment was 25 minutes.     Malachy Mood, MD 02/28/2023   Carolin Coy, CMA, am acting as scribe for Malachy Mood, MD.   I have reviewed the above documentation for accuracy and completeness, and I agree with the above.

## 2023-02-28 NOTE — Progress Notes (Signed)
Patient seen by Dr. America Brown are within treatment parameters.  Labs reviewed: and are not all within treatment parameters. Platelet Count 95  Per physician team, patient is ready for treatment and there are NO modifications to the treatment plan.

## 2023-03-01 ENCOUNTER — Encounter: Payer: Self-pay | Admitting: Hematology

## 2023-03-01 LAB — CANCER ANTIGEN 19-9: CA 19-9: 226 U/mL — ABNORMAL HIGH (ref 0–35)

## 2023-03-11 ENCOUNTER — Other Ambulatory Visit: Payer: Self-pay

## 2023-03-12 ENCOUNTER — Encounter (HOSPITAL_COMMUNITY): Payer: Self-pay

## 2023-03-12 ENCOUNTER — Ambulatory Visit (HOSPITAL_COMMUNITY)
Admission: RE | Admit: 2023-03-12 | Discharge: 2023-03-12 | Disposition: A | Discharge status: Home / Self Care | Payer: Medicare Other | Source: Ambulatory Visit | Attending: Physician Assistant | Admitting: Physician Assistant

## 2023-03-12 DIAGNOSIS — C259 Malignant neoplasm of pancreas, unspecified: Secondary | ICD-10-CM | POA: Diagnosis not present

## 2023-03-12 MED ORDER — SODIUM CHLORIDE (PF) 0.9 % IJ SOLN
INTRAMUSCULAR | Status: AC
  Filled 2023-03-12: qty 50

## 2023-03-12 MED ORDER — IOHEXOL 300 MG/ML  SOLN
100.0000 mL | Freq: Once | INTRAMUSCULAR | Status: AC | PRN
  Administered 2023-03-12: 100 mL via INTRAVENOUS

## 2023-03-13 ENCOUNTER — Ambulatory Visit: Payer: Medicare Other | Admitting: Hematology

## 2023-03-13 ENCOUNTER — Other Ambulatory Visit: Payer: Medicare Other

## 2023-03-13 ENCOUNTER — Ambulatory Visit: Payer: Medicare Other

## 2023-03-13 NOTE — Assessment & Plan Note (Signed)
[id number] with numerous small cavitary lesions in bilateral lungs, MMR normal  -diagnosed 07/2021, after incidental finding on CT scan for kidney stone, by colonoscopy/EUS. -Baseline CA 19-9 elevated to 273 on 07/03/21 -He began first-line mFOLFIRINOX on 08/13/21, but required dose reduction and move to every 3 weeks. -his CA 19-9 trended up but is now stable. -restaging CT CAP 08/12/22 showed: multiple new and enlarging pulmonary metastases; pancreatic mass grossly stable. Will repeat in 3 months. -we changed his irinotecan to liposomal on 09/05/22. He tolerated better and feels stronger. -Due to disease progression, his treatment was changed to gemcitabine and Abraxane every 2 weeks on 01/02/2023 -He tolerated the first two cycles chemotherapy very well with mild fatigue.  I increased gemcitabine to /m2 from cycle 3 -Restaging CT scan from yesterday showed

## 2023-03-14 ENCOUNTER — Inpatient Hospital Stay (HOSPITAL_BASED_OUTPATIENT_CLINIC_OR_DEPARTMENT_OTHER): Payer: Medicare Other | Admitting: Hematology

## 2023-03-14 ENCOUNTER — Inpatient Hospital Stay: Payer: Medicare Other

## 2023-03-14 ENCOUNTER — Encounter: Payer: Self-pay | Admitting: Hematology

## 2023-03-14 ENCOUNTER — Other Ambulatory Visit: Payer: Self-pay

## 2023-03-14 VITALS — BP 119/79 | HR 74 | Temp 97.7°F | Resp 13 | Wt 199.8 lb

## 2023-03-14 VITALS — BP 117/85 | HR 69 | Resp 16

## 2023-03-14 DIAGNOSIS — Z5111 Encounter for antineoplastic chemotherapy: Secondary | ICD-10-CM | POA: Diagnosis not present

## 2023-03-14 DIAGNOSIS — C259 Malignant neoplasm of pancreas, unspecified: Secondary | ICD-10-CM

## 2023-03-14 DIAGNOSIS — E860 Dehydration: Secondary | ICD-10-CM

## 2023-03-14 DIAGNOSIS — Z95828 Presence of other vascular implants and grafts: Secondary | ICD-10-CM

## 2023-03-14 LAB — CBC WITH DIFFERENTIAL (CANCER CENTER ONLY)
Abs Immature Granulocytes: 0.04 K/uL (ref 0.00–0.07)
Basophils Absolute: 0 K/uL (ref 0.0–0.1)
Basophils Relative: 1 %
Eosinophils Absolute: 0.2 K/uL (ref 0.0–0.5)
Eosinophils Relative: 4 %
HCT: 33.1 % — ABNORMAL LOW (ref 39.0–52.0)
Hemoglobin: 11.5 g/dL — ABNORMAL LOW (ref 13.0–17.0)
Immature Granulocytes: 1 %
Lymphocytes Relative: 24 %
Lymphs Abs: 1.3 K/uL (ref 0.7–4.0)
MCH: 35.3 pg — ABNORMAL HIGH (ref 26.0–34.0)
MCHC: 34.7 g/dL (ref 30.0–36.0)
MCV: 101.5 fL — ABNORMAL HIGH (ref 80.0–100.0)
Monocytes Absolute: 0.7 K/uL (ref 0.1–1.0)
Monocytes Relative: 13 %
Neutro Abs: 3.1 K/uL (ref 1.7–7.7)
Neutrophils Relative %: 57 %
Platelet Count: 97 K/uL — ABNORMAL LOW (ref 150–400)
RBC: 3.26 MIL/uL — ABNORMAL LOW (ref 4.22–5.81)
RDW: 13.5 % (ref 11.5–15.5)
WBC Count: 5.4 K/uL (ref 4.0–10.5)
nRBC: 0 % (ref 0.0–0.2)

## 2023-03-14 LAB — CMP (CANCER CENTER ONLY)
ALT: 23 U/L (ref 0–44)
AST: 21 U/L (ref 15–41)
Albumin: 3.9 g/dL (ref 3.5–5.0)
Alkaline Phosphatase: 96 U/L (ref 38–126)
Anion gap: 5 (ref 5–15)
BUN: 16 mg/dL (ref 8–23)
CO2: 26 mmol/L (ref 22–32)
Calcium: 8.8 mg/dL — ABNORMAL LOW (ref 8.9–10.3)
Chloride: 111 mmol/L (ref 98–111)
Creatinine: 0.97 mg/dL (ref 0.61–1.24)
GFR, Estimated: >60 mL/min
Glucose, Bld: 160 mg/dL — ABNORMAL HIGH (ref 70–99)
Potassium: 3.4 mmol/L — ABNORMAL LOW (ref 3.5–5.1)
Sodium: 142 mmol/L (ref 135–145)
Total Bilirubin: 0.5 mg/dL (ref 0.3–1.2)
Total Protein: 6.3 g/dL — ABNORMAL LOW (ref 6.5–8.1)

## 2023-03-14 MED ORDER — SODIUM CHLORIDE 0.9% FLUSH
10.0000 mL | INTRAVENOUS | Status: DC | PRN
  Administered 2023-03-14: 10 mL

## 2023-03-14 MED ORDER — SODIUM CHLORIDE 0.9 % IV SOLN
1000.0000 mg/m2 | Freq: Once | INTRAVENOUS | Status: AC
  Administered 2023-03-14: 2052 mg via INTRAVENOUS
  Filled 2023-03-14: qty 53.97

## 2023-03-14 MED ORDER — SODIUM CHLORIDE 0.9 % IV SOLN: Freq: Once | INTRAVENOUS | Status: AC

## 2023-03-14 MED ORDER — ALPRAZOLAM 0.25 MG PO TABS
0.2500 mg | ORAL_TABLET | Freq: Two times a day (BID) | ORAL | 0 refills | Status: DC | PRN
Start: 2023-03-14 — End: 2023-03-28

## 2023-03-14 MED ORDER — TRAMADOL HCL 50 MG PO TABS
50.0000 mg | ORAL_TABLET | Freq: Four times a day (QID) | ORAL | 0 refills | Status: DC | PRN
Start: 2023-03-14 — End: 2023-04-11

## 2023-03-14 MED ORDER — POTASSIUM CHLORIDE CRYS ER 10 MEQ PO TBCR: 10.0000 meq | EXTENDED_RELEASE_TABLET | Freq: Two times a day (BID) | ORAL | 0 refills | Status: DC

## 2023-03-14 MED ORDER — PACLITAXEL PROTEIN-BOUND CHEMO INJECTION 100 MG
100.0000 mg/m2 | Freq: Once | INTRAVENOUS | Status: AC
  Administered 2023-03-14: 200 mg via INTRAVENOUS
  Filled 2023-03-14: qty 40

## 2023-03-14 MED ORDER — SODIUM CHLORIDE 0.9% FLUSH
10.0000 mL | Freq: Once | INTRAVENOUS | Status: AC
  Administered 2023-03-14: 10 mL

## 2023-03-14 MED ORDER — PROCHLORPERAZINE MALEATE 10 MG PO TABS
10.0000 mg | ORAL_TABLET | Freq: Once | ORAL | Status: AC
  Administered 2023-03-14: 10 mg via ORAL
  Filled 2023-03-14: qty 1

## 2023-03-14 MED ORDER — HEPARIN SOD (PORK) LOCK FLUSH 100 UNIT/ML IV SOLN
500.0000 [IU] | Freq: Once | INTRAVENOUS | Status: AC | PRN
  Administered 2023-03-14: 500 [IU]

## 2023-03-14 NOTE — Progress Notes (Signed)
[facility]   Telephone:(336) [phone] Fax:(336) [phone]   Clinic Follow up Note   Patient Care Team: Lucky Cowboy, MD as PCP - General (Internal Medicine) Pollyann Samples, NP as PCP - Hematology/Oncology (Nurse Practitioner) Thomasene Ripple, DO as PCP - Cardiology (Cardiology) Nadara Mustard, MD as Consulting Physician (Orthopedic Surgery) Laurey Morale, MD as Consulting Physician (Cardiology) Malachy Mood, MD as Consulting Physician (Oncology)  Date of Service:  03/14/2023  CHIEF COMPLAINT: f/u of metastatic pancreatic cancer   CURRENT THERAPY:   Gemcitabine and Abraxane q28d     ASSESSMENT:  Zachary Reid is a 67 y.o. male with   Pancreatic adenocarcinoma (HCC) [phone] with numerous small cavitary lesions in bilateral lungs, MMR normal  -diagnosed 07/2021, after incidental finding on CT scan for kidney stone, by colonoscopy/EUS. -Baseline CA 19-9 elevated to 273 on 07/03/21 -He began first-line mFOLFIRINOX on 08/13/21, but required dose reduction and move to every 3 weeks. -his CA 19-9 trended up but is now stable. -restaging CT CAP 08/12/22 showed: multiple new and enlarging pulmonary metastases; pancreatic mass grossly stable. Will repeat in 3 months. -we changed his irinotecan to liposomal on 09/05/22. He tolerated better and feels stronger. -Due to disease progression, his treatment was changed to gemcitabine and Abraxane every 2 weeks on 01/02/2023 -He tolerated the first two cycles chemotherapy very well with mild fatigue.  I increased gemcitabine to 1000mg /m2 from cycle 3 -Restaging CT scan from yesterday showed stable disease overall. His primary pancreatic tumor is slightly increased in size, however his multiple cavitary lung nodules are slightly improved with thinner walls now.  No new lesions.  I personally reviewed the scan images with patient today. -He is clinically stable, tolerating treatment reasonably well with moderate fatigue after infusion, he  is able to recover and function well at home.  DNR -Patient understands his treatment is palliative, to prolong his life. -He has agreed with DNR.   PLAN: -discuss CT scan-stable -Continue Gemcitabine and Abraxane chemo regiment -lab reviewed hg 11.5, Platelet count -97 -Potassium level low 3.4, I called in oral potassium chloride 10 mill equal daily for him -I refill Tramadol -proceed with D15,CGemcitabine/Abraxane  same reduce dose -lab/flush and gem/abraxane 5/24   SUMMARY OF ONCOLOGIC HISTORY: Oncology History Overview Note   Cancer Staging  Pancreatic adenocarcinoma [facility]) Staging form: Exocrine Pancreas, AJCC 8th Edition - Clinical stage from 07/26/2021: Stage IV (cT4, cN0, cM1) - Signed by Malachy Mood, MD on 07/28/2021    Pancreatic adenocarcinoma (HCC)  07/02/2021 Initial Diagnosis   Pancreatic adenocarcinoma (HCC)   07/26/2021 Cancer Staging   Staging form: Exocrine Pancreas, AJCC 8th Edition - Clinical stage from 07/26/2021: Stage IV (cT4, cN0, cM1) - Signed by Malachy Mood, MD on 07/28/2021 Stage prefix: Initial diagnosis Total positive nodes: 0   08/13/2021 - 07/06/2022 Chemotherapy   Patient is on Treatment Plan : PANCREAS Modified FOLFIRINOX q14d x 4 cycles      Genetic Testing   [facility] CancerNext-Expanded results (77 genes) were negative. No pathogenic variants were identified. 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 The report date is 08/15/2021.    The CancerNext-Expanded gene panel offered by [facility] and includes sequencing, rearrangement, and RNA analysis for the following 77 genes: AIP, ALK, APC, ATM, AXIN2, BAP1, BARD1, BLM, BMPR1A, BRCA1, BRCA2, BRIP1, CDC73, CDH1, CDK4, CDKN1B, CDKN2A, CHEK2, CTNNA1, DICER1, FANCC, FH, FLCN, GALNT12, KIF1B, LZTR1, MAX, MEN1, MET, MLH1, MSH2, MSH3, MSH6, MUTYH, NBN, NF1, NF2, NTHL1, PALB2, PHOX2B,  PMS2, POT1, PRKAR1A, PTCH1, PTEN, RAD51C, RAD51D, RB1, RECQL, RET, SDHA, SDHAF2, SDHB, SDHC, SDHD,  SMAD4, SMARCA4, SMARCB1, SMARCE1, STK11, SUFU, TMEM127, TP53, TSC1, TSC2, VHL and XRCC2 (sequencing and deletion/duplication); EGFR, EGLN1, HOXB13, KIT, MITF, PDGFRA, POLD1, and POLE (sequencing only); EPCAM and GREM1 (deletion/duplication only).     08/13/2021 - 12/06/2022 Chemotherapy   Patient is on Treatment Plan : PANCREAS Modified FOLFIRINOX q14d x 4 cycles     10/25/2021 Imaging   EXAM: CT CHEST, ABDOMEN, AND PELVIS WITH CONTRAST  IMPRESSION: 1. Innumerable bilateral pulmonary nodules, many of which are cavitary, consistent with metastatic disease. These nodules show no substantial change in are minimally progressed in the interval. 2. Mix cystic and solid lesion in the head and body of the pancreas is similar to prior and also comparing back to MRI 07/02/2021. 3. Hepatic cysts. 4. 8 mm nonobstructing left renal stone. 5. Aortic Atherosclerosis (ICD10-I70.0).   01/28/2022 Imaging   EXAM: CT CHEST, ABDOMEN, AND PELVIS WITH CONTRAST  IMPRESSION: 1. Innumerable bilateral small solid and cavitary pulmonary nodules, some of the cavitary nodules are slightly less thick-walled when compared with prior exam. 2. Mixed cystic and solid lesion in the head and body of the pancreas is similar to prior exam. 3. Nonobstructing left renal stone. 4.  Aortic Atherosclerosis (ICD10-I70.0).   11/29/2022 Imaging    IMPRESSION: 1. No significant change in primary pancreatic mass and adjacent cystic component. Unchanged appearance of soft tissue extending to involve the adjacent celiac axis, superior mesenteric artery origin, and portal confluence. 2. 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 3. Innumerable small pulmonary nodules throughout the lungs, the majority of which are cavitary. Although interval change is difficult to appreciate for the majority of these nodules due to small size, at least some of these are  slightly enlarged, consistent with worsened pulmonary metastatic disease. 4. Newly enlarged left retroperitoneal lymph nodes, which may reflect nodal metastatic disease or perhaps reactive to left hydronephrosis. Attention on follow-up. 5. A sizable calculus previously seen in the left renal pelvis has migrated to the middle third of the left ureter, with placement of a double-J left ureteral stent, with formed pigtails in the left renal pelvis and urinary bladder. Moderate left hydronephrosis and proximal hydroureter. 6. Coronary artery disease.   Aortic Atherosclerosis (ICD10-I70.0).   01/02/2023 -  Chemotherapy   Patient is on Treatment Plan : PANCREATIC Abraxane D1,8,15 + Gemcitabine D1,8,15 q28d     03/12/2023 Imaging    IMPRESSION: 1. Primary pancreatic mass with adjacent cystic component is slightly increased in size when compared with the prior exam. Similar soft tissue extending involve the celiac axis superior mesenteric artery origin and portal confluence. 2. Innumerable bilateral pulmonary nodules, some of which are cavitary, unchanged when compared with the prior. 3. Left retroperitoneal lymph nodes are decreased in size, likely resolving reactive adenopathy 4. Interval removal of left ureter stent.  No hydronephrosis. 5. coronary artery disease and aortic Atherosclerosis (ICD10-I70.0).      INTERVAL HISTORY:  Zachary Reid is here for a follow up of metastatic pancreatic cancer    He was last seen by me on 02/28/2023. He presents to the clinic alone. Pt state that he is having trouble sleeping the past couple of nights, but it happens after treatments   All other systems were reviewed with the patient and are negative.  MEDICAL HISTORY:  Past Medical History:  Diagnosis Date   Adenocarcinoma of pancreas, stage 4 (HCC)  07/2021   oncologist--- dr Mosetta Putt;   mets to  bilateral lung;  started chemo 08-13-2021   Anemia    GAD (generalized anxiety disorder)     History of adenomatous polyp of colon    Hyperlipidemia    Hypogonadism male    IBS (irritable bowel syndrome)    Insomnia    Left ureteral calculus    Metastatic cancer to lung (HCC) 07/2021   primary pancreatic cancer w/ numerous small cavity lesions bilateral lungs   PAF (paroxysmal atrial fibrillation) (HCC) 10/31/2022   cardiologist-- dr Kirtland Bouchard. tobb;  newly dx ED 10-31-2022 w/ RVR  in setting flank pain (kidney stone)  --  office note in epic 11-07-2022 normal ETT 11-13-2022, echo 11-07-2022 ef 65-70% w/ mild LVH, mild MR,  zio monitor not completed,  started on toprol and xarelto daily   Personal history of chemotherapy    Receiving chemotherapy for metastatic pancreatic cancer. Most recent infusion (as of 12/02/22) was on 11/15/22.   Port-A-Cath in place 08/10/2021   Type 2 diabetes mellitus treated with insulin [facility])    endocrinologist--- dr Lucianne Muss   Vitamin D deficiency    takes Vit D supplements   Wears contact lenses     SURGICAL HISTORY: Past Surgical History:  Procedure Laterality Date   APPENDECTOMY  1988   BIOPSY  07/26/2021   Procedure: BIOPSY;  Surgeon: Lemar Lofty., MD;  Location[facility] ENDOSCOPY;  Service: Gastroenterology;;   COLONOSCOPY WITH PROPOFOL N/A 07/26/2021   Procedure: COLONOSCOPY WITH PROPOFOL;  Surgeon: Lemar Lofty., MD;  Location: [facility] ENDOSCOPY;  Service: Gastroenterology;  Laterality: N/A;   CYSTOSCOPY WITH STENT PLACEMENT Left 10/31/2022   Procedure: CYSTOSCOPY WITH STENT PLACEMENT;  Surgeon: Belva Agee, MD;  Location: [facility] ORS;  Service: Urology;  Laterality: Left;   CYSTOSCOPY/URETEROSCOPY/HOLMIUM LASER/STENT PLACEMENT Left 12/03/2022   Procedure: CYSTOSCOPY/LEFT URETEROSCOPY/HOLMIUM LASER/STENT EXCHANGE;  Surgeon: Belva Agee, MD;  Location[facility];  Service: Urology;  Laterality: Left;  1 HR FOR CASE   ESOPHAGOGASTRODUODENOSCOPY (EGD) WITH PROPOFOL N/A 07/26/2021   Procedure: ESOPHAGOGASTRODUODENOSCOPY  (EGD) WITH PROPOFOL;  Surgeon: Meridee Score Netty Starring., MD;  Location: [facility] ENDOSCOPY;  Service: Gastroenterology;  Laterality: N/A;   EUS N/A 07/26/2021   Procedure: UPPER ENDOSCOPIC ULTRASOUND (EUS) RADIAL;  Surgeon: Lemar Lofty., MD;  Location: [facility] ENDOSCOPY;  Service: Gastroenterology;  Laterality: N/A;   FINE NEEDLE ASPIRATION  07/26/2021   Procedure: FINE NEEDLE ASPIRATION (FNA) LINEAR;  Surgeon: Meridee Score Netty Starring., MD;  Location[facility] ENDOSCOPY;  Service: Gastroenterology;;   IR IMAGING GUIDED PORT INSERTION  08/10/2021   POLYPECTOMY  07/26/2021   Procedure: POLYPECTOMY;  Surgeon: Meridee Score Netty Starring., MD;  Location: [facility] ENDOSCOPY;  Service: Gastroenterology;;   SHOULDER SURGERY Right    as a teenager    I have reviewed the social history and family history with the patient and they are unchanged from previous note.  ALLERGIES:  is allergic to lipitor [atorvastatin].  MEDICATIONS:  Current Outpatient Medications  Medication Sig Dispense Refill   potassium chloride (KLOR-CON M) 10 MEQ tablet Take 1 tablet (10 mEq total) by mouth 2 (two) times daily. 14 tablet 0   ALPRAZolam (XANAX) 0.25 MG tablet Take 1-2 tablets (0.25-0.5 mg total) by mouth 2 (two) times daily as needed for anxiety. 60 tablet 0   Cholecalciferol (VITAMIN D3) 125 MCG (5000 UT) CAPS Take 5,000 Units by mouth in the morning and at bedtime.     Continuous Blood Gluc Sensor (DEXCOM G7 SENSOR) MISC 1  Device by Does not apply route as directed. Change sensor every 10 days (Patient taking differently: 1 Device by Does not apply route as directed. Change sensor every 10 days Per patient on 12/02/22, he has not yet received glucose monitor.) 3 each 3   gabapentin (NEURONTIN) 600 MG tablet Take 1 tablet (600 mg total) by mouth 3 (three) times daily as needed (pain). 60 tablet 1   glucose blood test strip Use as instructed 100 each 12   HYDROcodone-acetaminophen (NORCO/VICODIN) 5-325 MG tablet Take 1 tablet by mouth  every 4 (four) hours as needed for moderate pain. (Patient not taking: Reported on 12/02/2022) 20 tablet 0   hyoscyamine (LEVSIN SL) 0.125 MG SL tablet DISSOLVE ONE TABLET UNDER THE TONGUE 4 TIMES DAILY UP TO EVERY 4 HOURS AS NEEDED FOR NAUSEA, BLOATING, CRAMPING, OR DIARRHEA (Patient not taking: Reported on 12/02/2022) 100 tablet 0   insulin degludec (TRESIBA FLEXTOUCH) 100 UNIT/ML FlexTouch Pen Inject 32 Units into the skin daily. 15 mL 1   insulin lispro (HUMALOG KWIKPEN) 100 UNIT/ML KwikPen 10 TO 15 UNITS BEFORE MEALS AS DIRECTED (Patient taking differently: Inject 10-12 Units into the skin 3 (three) times daily before meals.) 15 mL 1   metoprolol succinate (TOPROL XL) 25 MG 24 hr tablet Take 0.5 tablets (12.5 mg total) by mouth daily. (Patient taking differently: Take 12.5 mg by mouth daily. Per patient, he will start taking Metoprolol on 12/02/22 and take again on the day of surgery (12/03/22.)) 45 tablet 3   oxyCODONE (ROXICODONE) 5 MG immediate release tablet Take 1 tablet (5 mg total) by mouth every 6 (six) hours as needed for up to 30 doses for severe pain. 30 tablet 0   prochlorperazine (COMPAZINE) 10 MG tablet Take 1 tablet (10 mg total) by mouth every 6 (six) hours as needed (Nausea or vomiting). 60 tablet 1   rivaroxaban (XARELTO) 20 MG TABS tablet Take 1 tablet (20 mg total) by mouth daily with supper. (Patient taking differently: Take 20 mg by mouth daily with supper. Patient stated that he last took Xarelto on 11/30/22.) 90 tablet 3   traMADol (ULTRAM) 50 MG tablet Take 1-2 tablets (50-100 mg total) by mouth every 6 (six) hours as needed. 120 tablet 0   zolpidem (AMBIEN) 10 MG tablet TAKE 1 TABLET BY MOUTH AT BEDTIME AS NEEDED FOR SLEEP. 30 tablet 0   No current facility-administered medications for this visit.   Facility-Administered Medications Ordered in Other Visits  Medication Dose Route Frequency Provider Last Rate Last Admin   sodium chloride flush (NS) 0.9 % injection 10 mL  10 mL  Intracatheter PRN Malachy Mood, MD   10 mL at 03/14/23 1147    PHYSICAL EXAMINATION: ECOG PERFORMANCE STATUS: 1 - Symptomatic but completely ambulatory  Vitals:   03/14/23 0839  BP: 119/79  Pulse: 74  Resp: 13  Temp: 97.7 F (36.5 C)  SpO2: 100%   Wt Readings from Last 3 Encounters:  03/14/23 199 lb 12.8 oz (90.6 kg)  02/28/23 198 lb 4.8 oz (89.9 kg)  02/14/23 192 lb 12.8 oz (87.5 kg)     GENERAL:alert, no distress and comfortable SKIN: skin color normal, no rashes or significant lesions EYES: normal, Conjunctiva are pink and non-injected, sclera clear  NEURO: alert & oriented x 3 with fluent speech  LABORATORY DATA:  I have reviewed the data as listed    Latest Ref Rng & Units 03/14/2023    8:14 AM 02/28/2023    8:17 AM 02/14/2023  9:30 AM  CBC  WBC 4.0 - 10.5 K/uL 5.4  5.8  6.5   Hemoglobin 13.0 - 17.0 g/dL 16.1  09.6  04.5   Hematocrit 39.0 - 52.0 % 33.1  33.2  40.0   Platelets 150 - 400 K/uL 97  95  150         Latest Ref Rng & Units 03/14/2023    8:14 AM 02/28/2023    8:17 AM 02/14/2023    9:30 AM  CMP  Glucose 70 - 99 mg/dL 409  811  914   BUN 8 - 23 mg/dL 16  14  21    Creatinine 0.61 - 1.24 mg/dL 7.82  9.56  2.13   Sodium 135 - 145 mmol/L 142  141  140   Potassium 3.5 - 5.1 mmol/L 3.4  3.7  4.1   Chloride 98 - 111 mmol/L 111  108  108   CO2 22 - 32 mmol/L 26  28  26    Calcium 8.9 - 10.3 mg/dL 8.8  8.9  9.2   Total Protein 6.5 - 8.1 g/dL 6.3  6.6  7.2   Total Bilirubin 0.3 - 1.2 mg/dL 0.5  0.3  0.4   Alkaline Phos 38 - 126 U/L 96  98  111   AST 15 - 41 U/L 21  21  20    ALT 0 - 44 U/L 23  22  23        RADIOGRAPHIC STUDIES: I have personally reviewed the radiological images as listed and agreed with the findings in the report. CT CHEST ABDOMEN PELVIS W CONTRAST  Result Date: 03/14/2023 CLINICAL DATA:  Pancreatic cancer restaging; * Tracking Code: BO * EXAM: CT CHEST, ABDOMEN, AND PELVIS WITH CONTRAST TECHNIQUE: Multidetector CT imaging of the chest,  abdomen and pelvis was performed following the standard protocol during bolus administration of intravenous contrast. RADIATION DOSE REDUCTION: This exam was performed according to the departmental dose-optimization program which includes automated exposure control, adjustment of the mA and/or kV according to patient size and/or use of iterative reconstruction technique. CONTRAST:  OMNIPAQUE IOHEXOL 300 MG/ML  SOLN COMPARISON:  CT chest, abdomen and pelvis dated November 29, 2022 FINDINGS: CT CHEST FINDINGS Cardiovascular: Normal heart size. No pericardial effusion. Normal caliber thoracic aorta. Moderate coronary artery calcifications. Mediastinum/Nodes: Esophagus and thyroid are unremarkable. No enlarged lymph nodes seen in the chest. Lungs/Pleura: Central airways are patent. Innumerable bilateral pulmonary nodules, some of which are cavitary. Unchanged in size when compared with the prior exam. Reference subpleural nodule of the right lower lobe measures 10 x 7 mm on series 5, image 121, unchanged. Musculoskeletal: No aggressive appearing osseous lesions. CT ABDOMEN PELVIS FINDINGS Hepatobiliary: Scattered low-attenuation liver lesions are unchanged when compared with the prior exam and likely simple cysts. No suspicious liver lesions. Gallbladder is unremarkable. No biliary ductal dilation. Pancreas: Mass arising from the pancreatic head measures 5.6 x 2.4 cm, previously 5.0 x 2.3 cm. Similar soft tissue extending involve the celiac axis superior mesenteric artery origin and portal confluence. Severe narrowing at the portal confluence. 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 Spleen: Normal in size without focal abnormality. Adrenals/Urinary Tract: Bilateral adrenal glands are unremarkable. No hydronephrosis. Nonobstructing right renal stones. Interval removal of left ureter stent. Bladder is unremarkable. Stomach/Bowel: Stomach is within normal limits. No  evidence of bowel wall thickening, distention, or inflammatory changes. Vascular/Lymphatic: Normal caliber abdominal aorta with mild atherosclerotic disease. Left retroperitoneal lymph nodes are decreased in size. Reference  node measuring 6 mm in short axis on series 2, image 80, previously 9 mm. No enlarged lymph nodes seen in the abdomen or pelvis. Reproductive: Prostate is unremarkable. Other: Bilateral fat containing inguinal hernias. No abdominopelvic ascites. Musculoskeletal: No aggressive appearing osseous lesions. IMPRESSION: 1. Primary pancreatic mass with adjacent cystic component is slightly increased in size when compared with the prior exam. Similar soft tissue extending involve the celiac axis superior mesenteric artery origin and portal confluence. 2. Innumerable bilateral pulmonary nodules, some of which are cavitary, unchanged when compared with the prior. 3. Left retroperitoneal lymph nodes are decreased in size, likely resolving reactive adenopathy 4. Interval removal of left ureter stent.  No hydronephrosis. 5. coronary artery disease and aortic Atherosclerosis (ICD10-I70.0). Electronically Signed   By: Allegra Lai M.D.   On: 03/14/2023 08:28      Orders Placed This Encounter  Procedures   CBC with Differential (Cancer Center Only)    Standing Status:   Future    Standing Expiration Date:   04/24/2024   CMP (Cancer Center only)    Standing Status:   Future    Standing Expiration Date:   04/24/2024   CBC with Differential (Cancer Center Only)    Standing Status:   Future    Standing Expiration Date:   05/08/2024   CMP (Cancer Center only)    Standing Status:   Future    Standing Expiration Date:   05/08/2024   All questions were answered. The patient knows to call the clinic with any problems, questions or concerns. No barriers to learning was detected. The total time spent in the appointment was 30 minutes.     Malachy Mood, MD 03/14/2023   Carolin Coy, CMA, am acting  as scribe for Malachy Mood, MD.   I have reviewed the above documentation for accuracy and completeness, and I agree with the above. '

## 2023-03-14 NOTE — Patient Instructions (Signed)
[place] [facility]   Discharge Instructions: Thank you for choosing [facility] to provide your oncology and hematology care.   If you have a lab appointment with the Cancer Center, please go directly to the Cancer Center and check in at the registration area.   Wear comfortable clothing and clothing appropriate for easy access to any Portacath or PICC line.   We strive to give you quality time with your provider. You may need to reschedule your appointment if you arrive late (15 or more minutes).  Arriving late affects you and other patients whose appointments are after yours.  Also, if you miss three or more appointments without notifying the office, you may be dismissed from the clinic at the provider's discretion.      For prescription refill requests, have your pharmacy contact our office and allow 72 hours for refills to be completed.    Today you received the following chemotherapy and/or immunotherapy agents: Paclitaxel Protein-Bound (Abraxane) and Gemcitabine (Gemzar)      To help prevent nausea and vomiting after your treatment, we encourage you to take your nausea medication as directed.  BELOW ARE SYMPTOMS THAT SHOULD BE REPORTED IMMEDIATELY: *FEVER GREATER THAN 100.4 F (38 C) OR HIGHER *CHILLS OR SWEATING *NAUSEA AND VOMITING THAT IS NOT CONTROLLED WITH YOUR NAUSEA MEDICATION *UNUSUAL SHORTNESS OF BREATH *UNUSUAL BRUISING OR BLEEDING *URINARY PROBLEMS (pain or burning when urinating, or frequent urination) *BOWEL PROBLEMS (unusual diarrhea, constipation, pain near the anus) TENDERNESS IN MOUTH AND THROAT WITH OR WITHOUT PRESENCE OF ULCERS (sore throat, sores in mouth, or a toothache) UNUSUAL RASH, SWELLING OR PAIN  UNUSUAL VAGINAL DISCHARGE OR ITCHING   Items with * indicate a potential emergency and should be followed up as soon as possible or go to the Emergency Department if any problems should occur.  Please show the  CHEMOTHERAPY ALERT CARD or IMMUNOTHERAPY ALERT CARD at check-in to the Emergency Department and triage nurse.  Should you have questions after your visit or need to cancel or reschedule your appointment, please contact [place] [facility]  Dept: [phone]  and follow the prompts.  Office hours are 8:00 a.m. to 4:30 p.m. Monday - Friday. Please note that voicemails left after 4:00 p.m. may not be returned until the following business day.  We are closed weekends and major holidays. You have access to a nurse at all times for urgent questions. Please call the main number to the clinic Dept: [phone] and follow the prompts.   For any non-urgent questions, you may also contact your provider using MyChart. We now offer e-Visits for anyone 60 and older to request care online for non-urgent symptoms. For details visit [email or web address].   Also download the MyChart app! Go to the app store, search "MyChart", open the app, select [place], and log in with your MyChart username and password.

## 2023-03-14 NOTE — Progress Notes (Signed)
Patient seen by Dr. America Brown are within treatment parameters.  Labs reviewed: and are not all within treatment parameters. HG 11.5 ,Platelet count 97   Per physician team, patient is ready for treatment and there are NO modifications to the treatment plan.

## 2023-03-15 IMAGING — CT CT RENAL STONE PROTOCOL
2 of 4 series · 15 of 46 positions shown, 17 images · non-contrast
Comparison: None.

CLINICAL DATA: Left flank pain, intermittent gross hematuria,
nephrolithiasis

EXAM:
CT ABDOMEN AND PELVIS WITHOUT CONTRAST
TECHNIQUE: Multidetector CT imaging of the abdomen and pelvis was performed
following the standard protocol without IV contrast.

[Series 2: renal stone 5.00 · axial · 0.72mm/px · z∈[-1577,-1157]mm · 12 of 96 slices shown, 14 images]
[im 8/96  soft-tissue]
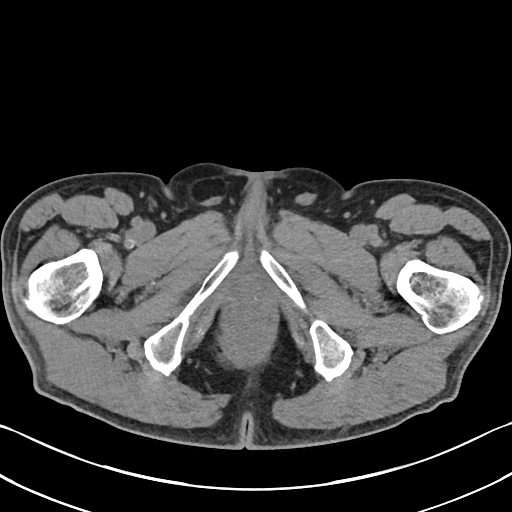
[im 8/96  bone]
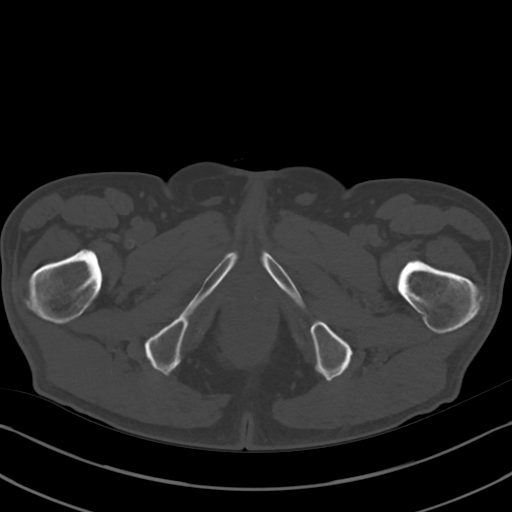
[im 16/96  soft-tissue]
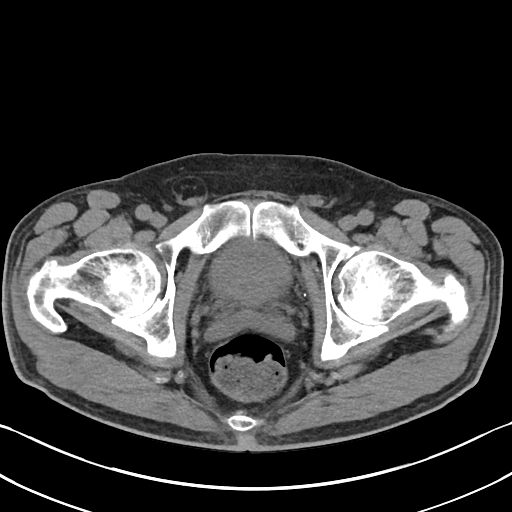
[im 23/96  soft-tissue]
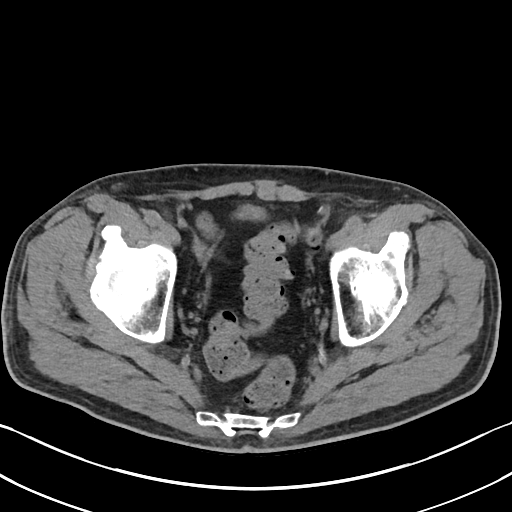
[im 31/96  soft-tissue]
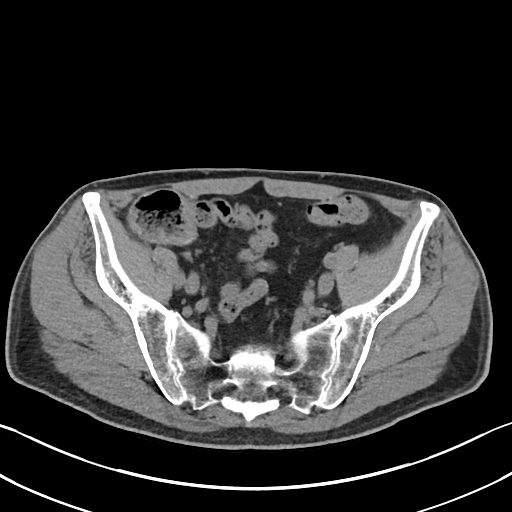
[im 39/96  soft-tissue]
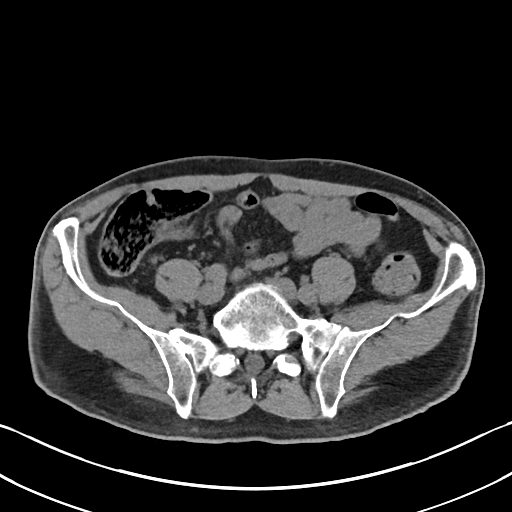
[im 46/96  soft-tissue]
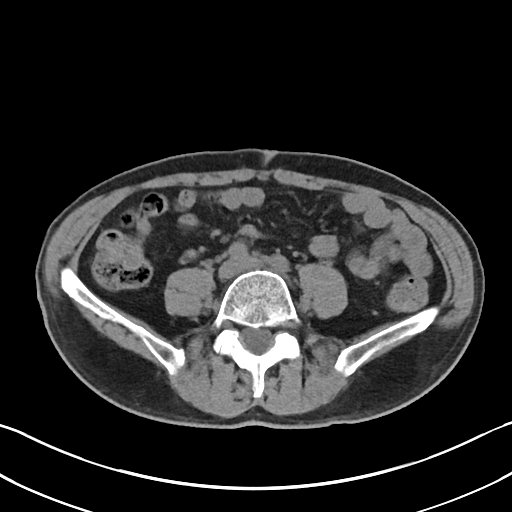
[im 54/96  soft-tissue]
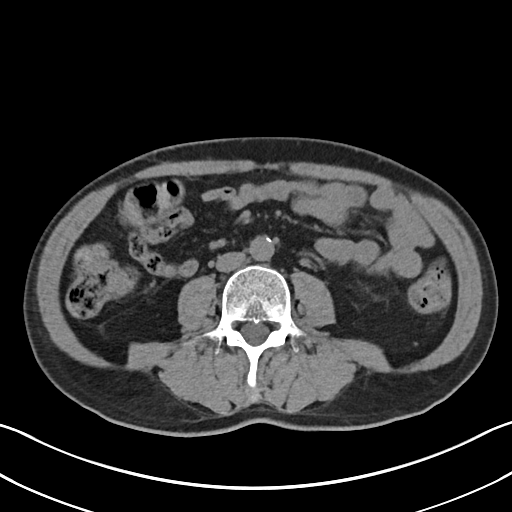
[im 61/96  soft-tissue]
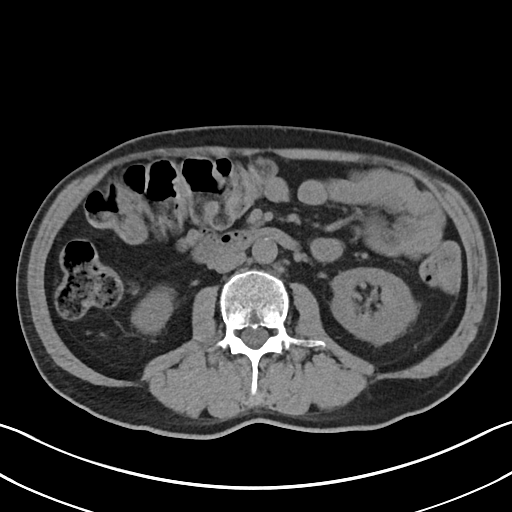
[im 69/96  soft-tissue]
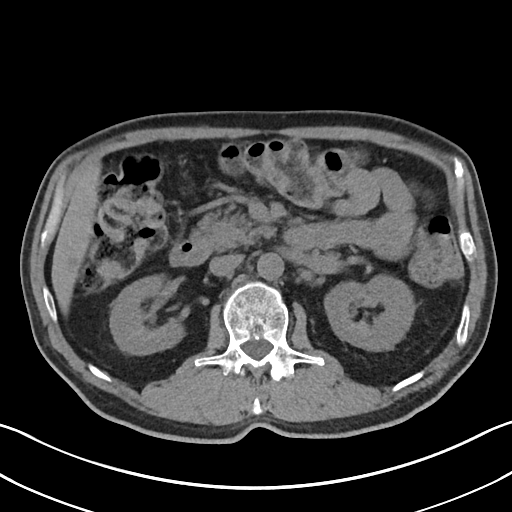
[im 69/96  bone]
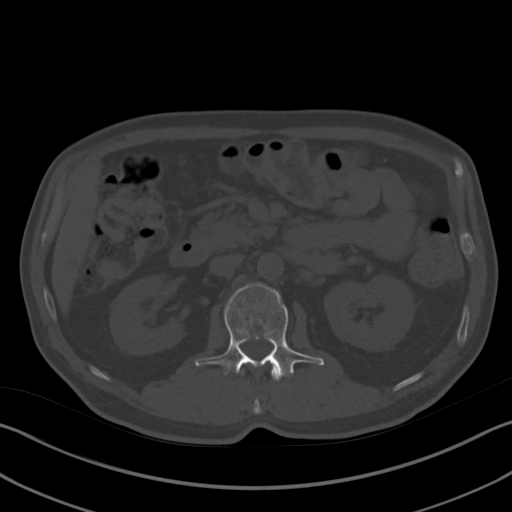
[im 77/96  soft-tissue]
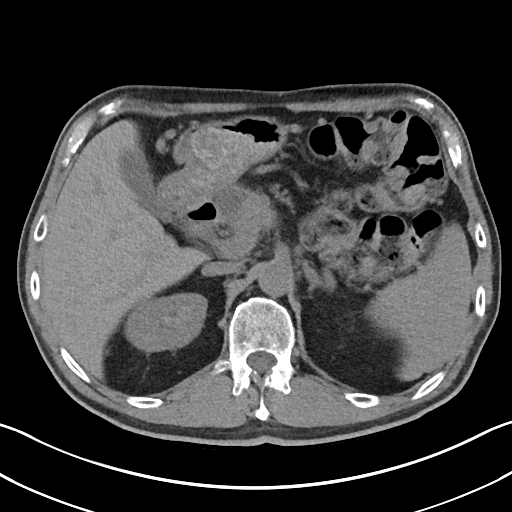
[im 84/96  soft-tissue]
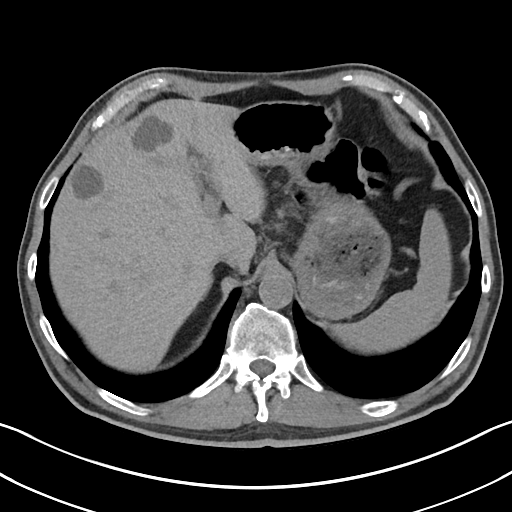
[im 92/96  soft-tissue]
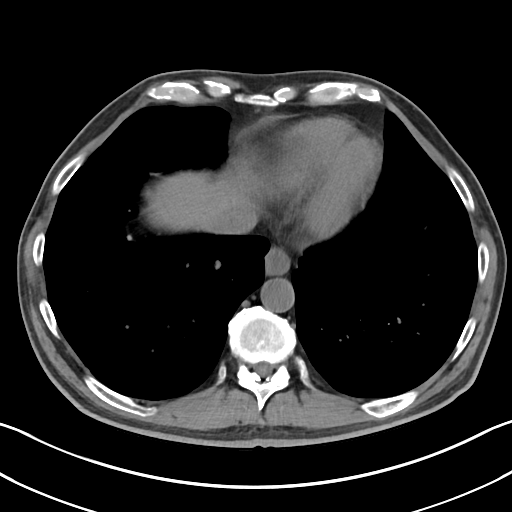

[Series 4: renal stone 2.00 cor · coronal · 0.72mm/px · 3 of 143 slices shown]
[im 48/143  soft-tissue]
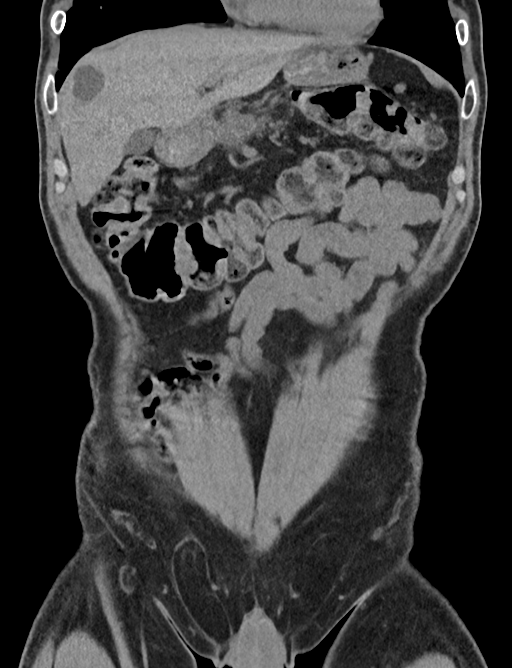
[im 64/143  soft-tissue]
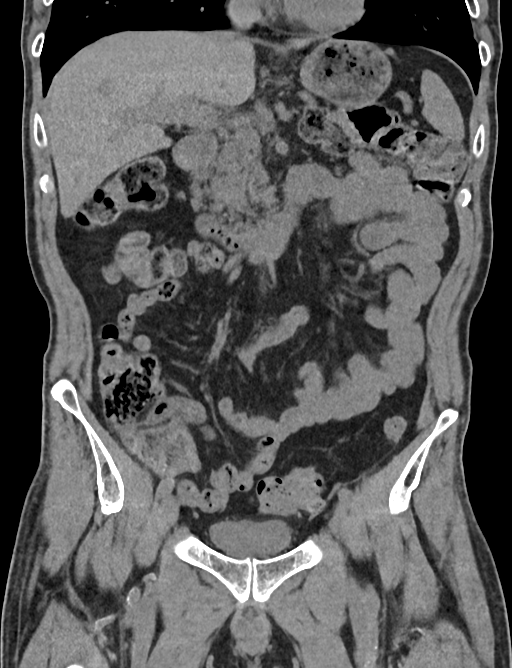
[im 79/143  soft-tissue]
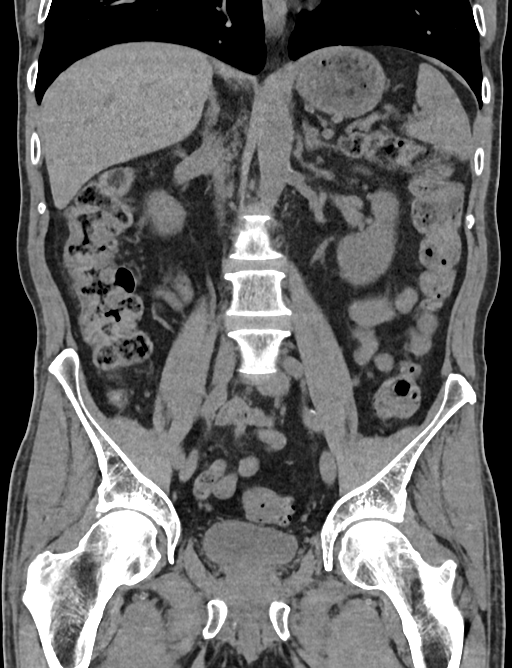

[15 of 46 positions shown; findings below may reference images not displayed]

FINDINGS: Lower chest: Innumerable small pulmonary nodules throughout the
included lung bases, some of the largest of which are cavitary, for
example a 0.8 cm cavitary nodule of the left lower lobe (series 3,
image 4) and a 0.9 cm solid nodule of the right lower lobe (series
3, image 5).

Hepatobiliary: There are multiple fluid attenuation simple cysts or
hemangiomata of the liver, as well as additional small, less clearly
fluid attenuation lesions that are incompletely characterized by
noncontrast CT, for example in the anterior right lobe of the liver
a 0.7 cm lesion (series 2, image 17). No gallstones, gallbladder
wall thickening, or biliary dilatation.

Pancreas: There is a hypodense lesion of the anterior pancreatic
head measuring 2.5 x 1.9 cm (series 2, image 21). No pancreatic
ductal dilatation or surrounding inflammatory changes.

Spleen: Normal in size without significant abnormality.

Adrenals/Urinary Tract: Adrenal glands are unremarkable. Small
nonobstructive calculus of the superior pole of the left kidney
(series 4, image 92). Bladder is unremarkable.

Stomach/Bowel: Stomach is within normal limits. Appendix is
surgically absent. No evidence of bowel wall thickening, distention,
or inflammatory changes.

Vascular/Lymphatic: Aortic atherosclerosis. No enlarged abdominal or
pelvic lymph nodes.

Reproductive: No mass or other significant abnormality.

Other: No abdominal wall hernia or abnormality. No abdominopelvic
ascites.

Musculoskeletal: No acute or significant osseous findings.
IMPRESSION: 1. Innumerable small pulmonary nodules throughout the included lung
bases, some of the largest of which are cavitary, highly concerning
for pulmonary metastatic disease.
2. There is a hypodense lesion of the anterior pancreatic head
measuring 2.5 x 1.9 cm. This is concerning for primary pancreatic
malignancy although may represent a pancreatic cystic lesion.
Recommend further evaluation with contrast enhanced MRI of the
abdomen.
3. There are multiple fluid attenuation simple cysts or hemangiomata
of the liver, as well as additional small, less clearly fluid
attenuation lesions that are incompletely characterized by
noncontrast CT, the latter concerning for hepatic metastatic disease
and as above, recommend contrast enhanced MRI of the abdomen.
4. No other noncontrast CT evidence of candidate primary malignancy
in the abdomen or pelvis.
5. Small nonobstructive calculus of the superior pole of the left
kidney. No right-sided calculi, ureteral calculi, or hydronephrosis.

These results will be called to the ordering clinician or
representative by the Radiologist Assistant, and communication
documented in the PACS or [REDACTED].

Aortic Atherosclerosis ([id number]).

## 2023-03-17 ENCOUNTER — Other Ambulatory Visit: Payer: Self-pay | Admitting: Endocrinology

## 2023-03-18 ENCOUNTER — Other Ambulatory Visit: Payer: Self-pay

## 2023-03-21 ENCOUNTER — Other Ambulatory Visit: Payer: Self-pay

## 2023-03-27 NOTE — Progress Notes (Signed)
[facility]   [phone] [phone]   Clinic Follow up Note   Patient Care Team: Lucky Cowboy, MD as PCP - General (Internal Medicine) Pollyann Samples, NP as PCP - Hematology/Oncology (Nurse Practitioner) Thomasene Ripple, DO as PCP - Cardiology (Cardiology) Nadara Mustard, MD as Consulting Physician (Orthopedic Surgery) Laurey Morale, MD as Consulting Physician (Cardiology) Malachy Mood, MD as Consulting Physician (Oncology)  Date of Service:  03/28/2023  CHIEF COMPLAINT: f/u of metastatic pancreatic cancer   CURRENT THERAPY:   Gemcitabine and Abraxane q28d     ASSESSMENT:  Zachary Reid is a 67 y.o. male with   Pancreatic adenocarcinoma (HCC) [phone] with numerous small cavitary lesions in bilateral lungs, MMR normal  -diagnosed 07/2021, after incidental finding on CT scan for kidney stone, by colonoscopy/EUS. -Baseline CA 19-9 elevated to 273 on 07/03/21 -He began first-line mFOLFIRINOX on 08/13/21, but required dose reduction and move to every 3 weeks. -his CA 19-9 trended up but is now stable. -restaging CT CAP 08/12/22 showed: multiple new and enlarging pulmonary metastases; pancreatic mass grossly stable. Will repeat in 3 months. -we changed his irinotecan to liposomal on 09/05/22. He tolerated better and feels stronger. -Due to disease progression, his treatment was changed to gemcitabine and Abraxane every 2 weeks on 01/02/2023 -He tolerated the first two cycles chemotherapy very well with mild fatigue.  I increased gemcitabine to 1000mg /m2 from cycle 3 -Restaging CT scan 03/12/2023 showed stable disease overall. His primary pancreatic tumor is slightly increased in size, however his multiple cavitary lung nodules are slightly improved with thinner walls now.   -will continue current therapy.  He is overall tolerating well -Due to his upcoming trip to [facility]: [place], I will slightly reduce his chemo dose today at his request.    DNR -Patient understands his treatment is palliative, to prolong his life. -He has agreed with DNR.   Anxiety and insomnia -He is on Xanax and Ambien as needed -He still has a few nights of insomnia despite Ambien or Xanax after treatment, I called in Ativan as needed for insomnia today  PLAN: - reduce this cycle chemo dose due to going on golf trip before next treatment - prescribe ativan for sleep as needed - change xanax to 0.50mg   - proceed with treatment today - keep treatment schedule as it is tolerating well    SUMMARY OF ONCOLOGIC HISTORY: Oncology History Overview Note   Cancer Staging  Pancreatic adenocarcinoma [facility]) Staging form: Exocrine Pancreas, AJCC 8th Edition - Clinical stage from 07/26/2021: Stage IV (cT4, cN0, cM1) - Signed by Malachy Mood, MD on 07/28/2021    Pancreatic adenocarcinoma (HCC)  07/02/2021 Initial Diagnosis   Pancreatic adenocarcinoma (HCC)   07/26/2021 Cancer Staging   Staging form: Exocrine Pancreas, AJCC 8th Edition - Clinical stage from 07/26/2021: Stage IV (cT4, cN0, cM1) - Signed by Malachy Mood, MD on 07/28/2021 Stage prefix: Initial diagnosis Total positive nodes: 0   08/13/2021 - 07/06/2022 Chemotherapy   Patient is on Treatment Plan : PANCREAS Modified FOLFIRINOX q14d x 4 cycles      Genetic Testing   [facility] CancerNext-Expanded results (77 genes) were negative. No pathogenic variants were identified. 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 The report date is 08/15/2021.    The CancerNext-Expanded gene panel offered by [facility] and includes sequencing, rearrangement, and RNA analysis for the following 77 genes: AIP, ALK, APC, ATM, AXIN2, BAP1, BARD1, BLM, BMPR1A, BRCA1, BRCA2, BRIP1, CDC73, CDH1,  CDK4, CDKN1B, CDKN2A, CHEK2, CTNNA1, DICER1, FANCC, FH, FLCN, GALNT12, KIF1B, LZTR1, MAX, MEN1, MET, MLH1, MSH2, MSH3, MSH6, MUTYH, NBN, NF1, NF2, NTHL1, PALB2, PHOX2B, PMS2, POT1, PRKAR1A, PTCH1, PTEN, RAD51C,  RAD51D, RB1, RECQL, RET, SDHA, SDHAF2, SDHB, SDHC, SDHD, SMAD4, SMARCA4, SMARCB1, SMARCE1, STK11, SUFU, TMEM127, TP53, TSC1, TSC2, VHL and XRCC2 (sequencing and deletion/duplication); EGFR, EGLN1, HOXB13, KIT, MITF, PDGFRA, POLD1, and POLE (sequencing only); EPCAM and GREM1 (deletion/duplication only).     08/13/2021 - 12/06/2022 Chemotherapy   Patient is on Treatment Plan : PANCREAS Modified FOLFIRINOX q14d x 4 cycles     10/25/2021 Imaging   EXAM: CT CHEST, ABDOMEN, AND PELVIS WITH CONTRAST  IMPRESSION: 1. Innumerable bilateral pulmonary nodules, many of which are cavitary, consistent with metastatic disease. These nodules show no substantial change in are minimally progressed in the interval. 2. Mix cystic and solid lesion in the head and body of the pancreas is similar to prior and also comparing back to MRI 07/02/2021. 3. Hepatic cysts. 4. 8 mm nonobstructing left renal stone. 5. Aortic Atherosclerosis (ICD10-I70.0).   01/28/2022 Imaging   EXAM: CT CHEST, ABDOMEN, AND PELVIS WITH CONTRAST  IMPRESSION: 1. Innumerable bilateral small solid and cavitary pulmonary nodules, some of the cavitary nodules are slightly less thick-walled when compared with prior exam. 2. Mixed cystic and solid lesion in the head and body of the pancreas is similar to prior exam. 3. Nonobstructing left renal stone. 4.  Aortic Atherosclerosis (ICD10-I70.0).   11/29/2022 Imaging    IMPRESSION: 1. No significant change in primary pancreatic mass and adjacent cystic component. Unchanged appearance of soft tissue extending to involve the adjacent celiac axis, superior mesenteric artery origin, and portal confluence. 2. 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 3. Innumerable small pulmonary nodules throughout the lungs, the majority of which are cavitary. Although interval change is difficult to appreciate for the majority of these  nodules due to small size, at least some of these are slightly enlarged, consistent with worsened pulmonary metastatic disease. 4. Newly enlarged left retroperitoneal lymph nodes, which may reflect nodal metastatic disease or perhaps reactive to left hydronephrosis. Attention on follow-up. 5. A sizable calculus previously seen in the left renal pelvis has migrated to the middle third of the left ureter, with placement of a double-J left ureteral stent, with formed pigtails in the left renal pelvis and urinary bladder. Moderate left hydronephrosis and proximal hydroureter. 6. Coronary artery disease.   Aortic Atherosclerosis (ICD10-I70.0).   01/02/2023 -  Chemotherapy   Patient is on Treatment Plan : PANCREATIC Abraxane D1,8,15 + Gemcitabine D1,8,15 q28d     03/12/2023 Imaging    IMPRESSION: 1. Primary pancreatic mass with adjacent cystic component is slightly increased in size when compared with the prior exam. Similar soft tissue extending involve the celiac axis superior mesenteric artery origin and portal confluence. 2. Innumerable bilateral pulmonary nodules, some of which are cavitary, unchanged when compared with the prior. 3. Left retroperitoneal lymph nodes are decreased in size, likely resolving reactive adenopathy 4. Interval removal of left ureter stent.  No hydronephrosis. 5. coronary artery disease and aortic Atherosclerosis (ICD10-I70.0).      INTERVAL HISTORY:  Zachary Reid is here for a follow up of metastatic pancreatic cancer    He was last seen by me on 03/14/2023. He came in alone today. He states he as fatigue the week after treatment. He states he is having trouble sleeping would like Ativan to hep with sleep and  an increase in his Xanax he is taking 2 in the morning and sometimes 2 at night.    All other systems were reviewed with the patient and are negative.  MEDICAL HISTORY:  Past Medical History:  Diagnosis Date   Adenocarcinoma of pancreas,  stage 4 (HCC) 07/2021   oncologist--- dr Mosetta Putt;   mets to  bilateral lung;  started chemo 08-13-2021   Anemia    GAD (generalized anxiety disorder)    History of adenomatous polyp of colon    Hyperlipidemia    Hypogonadism male    IBS (irritable bowel syndrome)    Insomnia    Left ureteral calculus    Metastatic cancer to lung (HCC) 07/2021   primary pancreatic cancer w/ numerous small cavity lesions bilateral lungs   PAF (paroxysmal atrial fibrillation) (HCC) 10/31/2022   cardiologist-- dr Kirtland Bouchard. tobb;  newly dx ED 10-31-2022 w/ RVR  in setting flank pain (kidney stone)  --  office note in epic 11-07-2022 normal ETT 11-13-2022, echo 11-07-2022 ef 65-70% w/ mild LVH, mild MR,  zio monitor not completed,  started on toprol and xarelto daily   Personal history of chemotherapy    Receiving chemotherapy for metastatic pancreatic cancer. Most recent infusion (as of 12/02/22) was on 11/15/22.   Port-A-Cath in place 08/10/2021   Type 2 diabetes mellitus treated with insulin [facility])    endocrinologist--- dr Lucianne Muss   Vitamin D deficiency    takes Vit D supplements   Wears contact lenses     SURGICAL HISTORY: Past Surgical History:  Procedure Laterality Date   APPENDECTOMY  1988   BIOPSY  07/26/2021   Procedure: BIOPSY;  Surgeon: Lemar Lofty., MD;  Location: [facility] ENDOSCOPY;  Service: Gastroenterology;;   COLONOSCOPY WITH PROPOFOL N/A 07/26/2021   Procedure: COLONOSCOPY WITH PROPOFOL;  Surgeon: Lemar Lofty., MD;  Location: [facility] ENDOSCOPY;  Service: Gastroenterology;  Laterality: N/A;   CYSTOSCOPY WITH STENT PLACEMENT Left 10/31/2022   Procedure: CYSTOSCOPY WITH STENT PLACEMENT;  Surgeon: Belva Agee, MD;  Location: [facility] ORS;  Service: Urology;  Laterality: Left;   CYSTOSCOPY/URETEROSCOPY/HOLMIUM LASER/STENT PLACEMENT Left 12/03/2022   Procedure: CYSTOSCOPY/LEFT URETEROSCOPY/HOLMIUM LASER/STENT EXCHANGE;  Surgeon: Belva Agee, MD;  Location: [facility];   Service: Urology;  Laterality: Left;  1 HR FOR CASE   ESOPHAGOGASTRODUODENOSCOPY (EGD) WITH PROPOFOL N/A 07/26/2021   Procedure: ESOPHAGOGASTRODUODENOSCOPY (EGD) WITH PROPOFOL;  Surgeon: Meridee Score Netty Starring., MD;  Location: [facility] ENDOSCOPY;  Service: Gastroenterology;  Laterality: N/A;   EUS N/A 07/26/2021   Procedure: UPPER ENDOSCOPIC ULTRASOUND (EUS) RADIAL;  Surgeon: Lemar Lofty., MD;  Location: [facility] ENDOSCOPY;  Service: Gastroenterology;  Laterality: N/A;   FINE NEEDLE ASPIRATION  07/26/2021   Procedure: FINE NEEDLE ASPIRATION (FNA) LINEAR;  Surgeon: Meridee Score Netty Starring., MD;  Location: [facility] ENDOSCOPY;  Service: Gastroenterology;;   IR IMAGING GUIDED PORT INSERTION  08/10/2021   POLYPECTOMY  07/26/2021   Procedure: POLYPECTOMY;  Surgeon: Meridee Score Netty Starring., MD;  Location: [facility] ENDOSCOPY;  Service: Gastroenterology;;   SHOULDER SURGERY Right    as a teenager    I have reviewed the social history and family history with the patient and they are unchanged from previous note.  ALLERGIES:  is allergic to lipitor [atorvastatin].  MEDICATIONS:  Current Outpatient Medications  Medication Sig Dispense Refill   ALPRAZolam (XANAX) 0.5 MG tablet Take 1 tablet (0.5 mg total) by mouth 3 (three) times daily as needed for anxiety. 60 tablet 0   LORazepam (ATIVAN) 1 MG tablet Take 1  tablet (1 mg total) by mouth 3 times/day as needed-between meals & bedtime for sleep. 20 tablet 0   Cholecalciferol (VITAMIN D3) 125 MCG (5000 UT) CAPS Take 5,000 Units by mouth in the morning and at bedtime.     Continuous Blood Gluc Sensor (DEXCOM G7 SENSOR) MISC 1 Device by Does not apply route as directed. Change sensor every 10 days (Patient taking differently: 1 Device by Does not apply route as directed. Change sensor every 10 days Per patient on 12/02/22, he has not yet received glucose monitor.) 3 each 3   gabapentin (NEURONTIN) 600 MG tablet Take 1 tablet (600 mg total) by mouth 3 (three) times daily as  needed (pain). 60 tablet 1   glucose blood test strip Use as instructed 100 each 12   HYDROcodone-acetaminophen (NORCO/VICODIN) 5-325 MG tablet Take 1 tablet by mouth every 4 (four) hours as needed for moderate pain. (Patient not taking: Reported on 12/02/2022) 20 tablet 0   hyoscyamine (LEVSIN SL) 0.125 MG SL tablet DISSOLVE ONE TABLET UNDER THE TONGUE 4 TIMES DAILY UP TO EVERY 4 HOURS AS NEEDED FOR NAUSEA, BLOATING, CRAMPING, OR DIARRHEA (Patient not taking: Reported on 12/02/2022) 100 tablet 0   insulin degludec (TRESIBA FLEXTOUCH) 100 UNIT/ML FlexTouch Pen INJECT 32 UNITS INTO THE SKIN DAILY. 15 mL 1   insulin lispro (HUMALOG KWIKPEN) 100 UNIT/ML KwikPen 10 TO 15 UNITS BEFORE MEALS AS DIRECTED (Patient taking differently: Inject 10-12 Units into the skin 3 (three) times daily before meals.) 15 mL 1   metoprolol succinate (TOPROL XL) 25 MG 24 hr tablet Take 0.5 tablets (12.5 mg total) by mouth daily. (Patient taking differently: Take 12.5 mg by mouth daily. Per patient, he will start taking Metoprolol on 12/02/22 and take again on the day of surgery (12/03/22.)) 45 tablet 3   oxyCODONE (ROXICODONE) 5 MG immediate release tablet Take 1 tablet (5 mg total) by mouth every 6 (six) hours as needed for up to 30 doses for severe pain. 30 tablet 0   potassium chloride (KLOR-CON M) 10 MEQ tablet Take 1 tablet (10 mEq total) by mouth 2 (two) times daily. 14 tablet 0   prochlorperazine (COMPAZINE) 10 MG tablet Take 1 tablet (10 mg total) by mouth every 6 (six) hours as needed (Nausea or vomiting). 60 tablet 1   rivaroxaban (XARELTO) 20 MG TABS tablet Take 1 tablet (20 mg total) by mouth daily with supper. (Patient taking differently: Take 20 mg by mouth daily with supper. Patient stated that he last took Xarelto on 11/30/22.) 90 tablet 3   traMADol (ULTRAM) 50 MG tablet Take 1-2 tablets (50-100 mg total) by mouth every 6 (six) hours as needed. 120 tablet 0   zolpidem (AMBIEN) 10 MG tablet Take 1 tablet (10 mg total)  by mouth at bedtime as needed. for sleep 30 tablet 0   No current facility-administered medications for this visit.    PHYSICAL EXAMINATION: ECOG PERFORMANCE STATUS: 1 - Symptomatic but completely ambulatory  Vitals:   03/28/23 0823  BP: 129/77  Pulse: 81  Resp: 18  Temp: 98.1 F (36.7 C)  SpO2: 98%    Wt Readings from Last 3 Encounters:  03/28/23 197 lb 8 oz (89.6 kg)  03/14/23 199 lb 12.8 oz (90.6 kg)  02/28/23 198 lb 4.8 oz (89.9 kg)     GENERAL:alert, no distress and comfortable SKIN: skin color normal, no rashes or significant lesions EYES: normal, Conjunctiva are pink and non-injected, sclera clear  NEURO: alert & oriented x 3 with  fluent speech  LABORATORY DATA:  I have reviewed the data as listed    Latest Ref Rng & Units 03/28/2023    8:08 AM 03/14/2023    8:14 AM 02/28/2023    8:17 AM  CBC  WBC 4.0 - 10.5 K/uL 5.6  5.4  5.8   Hemoglobin 13.0 - 17.0 g/dL 19.1  47.8  29.5   Hematocrit 39.0 - 52.0 % 34.8  33.1  33.2   Platelets 150 - 400 K/uL 98  97  95         Latest Ref Rng & Units 03/28/2023    8:08 AM 03/14/2023    8:14 AM 02/28/2023    8:17 AM  CMP  Glucose 70 - 99 mg/dL 621  308  657   BUN 8 - 23 mg/dL 15  16  14    Creatinine 0.61 - 1.24 mg/dL 8.46  9.62  9.52   Sodium 135 - 145 mmol/L 140  142  141   Potassium 3.5 - 5.1 mmol/L 3.9  3.4  3.7   Chloride 98 - 111 mmol/L 108  111  108   CO2 22 - 32 mmol/L 26  26  28    Calcium 8.9 - 10.3 mg/dL 8.6  8.8  8.9   Total Protein 6.5 - 8.1 g/dL 6.7  6.3  6.6   Total Bilirubin 0.3 - 1.2 mg/dL 0.5  0.5  0.3   Alkaline Phos 38 - 126 U/L 94  96  98   AST 15 - 41 U/L 21  21  21    ALT 0 - 44 U/L 21  23  22        RADIOGRAPHIC STUDIES: I have personally reviewed the radiological images as listed and agreed with the findings in the report. No results found.    Orders Placed This Encounter  Procedures   CBC with Differential (Cancer Center Only)    Standing Status:   Future    Standing Expiration Date:    05/22/2024   CMP (Cancer Center only)    Standing Status:   Future    Standing Expiration Date:   05/22/2024   CBC with Differential (Cancer Center Only)    Standing Status:   Future    Standing Expiration Date:   06/05/2024   CMP (Cancer Center only)    Standing Status:   Future    Standing Expiration Date:   06/05/2024   CBC with Differential (Cancer Center Only)    Standing Status:   Future    Standing Expiration Date:   06/19/2024   CMP (Cancer Center only)    Standing Status:   Future    Standing Expiration Date:   06/19/2024   CBC with Differential (Cancer Center Only)    Standing Status:   Future    Standing Expiration Date:   07/03/2024   CMP (Cancer Center only)    Standing Status:   Future    Standing Expiration Date:   07/03/2024   All questions were answered. The patient knows to call the clinic with any problems, questions or concerns. No barriers to learning was detected. The total time spent in the appointment was 30 minutes.     Malachy Mood, MD 03/28/2023

## 2023-03-28 ENCOUNTER — Other Ambulatory Visit: Payer: Self-pay

## 2023-03-28 ENCOUNTER — Inpatient Hospital Stay: Payer: Medicare Other | Attending: Hematology | Admitting: Hematology

## 2023-03-28 ENCOUNTER — Inpatient Hospital Stay: Payer: Medicare Other

## 2023-03-28 ENCOUNTER — Encounter: Payer: Self-pay | Admitting: Hematology

## 2023-03-28 VITALS — BP 129/77 | HR 81 | Temp 98.1°F | Resp 18 | Ht 71.0 in | Wt 197.5 lb

## 2023-03-28 VITALS — BP 116/83 | HR 62 | Resp 17

## 2023-03-28 DIAGNOSIS — I2510 Atherosclerotic heart disease of native coronary artery without angina pectoris: Secondary | ICD-10-CM | POA: Diagnosis not present

## 2023-03-28 DIAGNOSIS — K7689 Other specified diseases of liver: Secondary | ICD-10-CM | POA: Diagnosis not present

## 2023-03-28 DIAGNOSIS — Z794 Long term (current) use of insulin: Secondary | ICD-10-CM | POA: Insufficient documentation

## 2023-03-28 DIAGNOSIS — C259 Malignant neoplasm of pancreas, unspecified: Secondary | ICD-10-CM | POA: Insufficient documentation

## 2023-03-28 DIAGNOSIS — Z86010 Personal history of colonic polyps: Secondary | ICD-10-CM | POA: Diagnosis not present

## 2023-03-28 DIAGNOSIS — Z5111 Encounter for antineoplastic chemotherapy: Secondary | ICD-10-CM | POA: Diagnosis present

## 2023-03-28 DIAGNOSIS — Z79899 Other long term (current) drug therapy: Secondary | ICD-10-CM | POA: Insufficient documentation

## 2023-03-28 DIAGNOSIS — E559 Vitamin D deficiency, unspecified: Secondary | ICD-10-CM | POA: Diagnosis not present

## 2023-03-28 DIAGNOSIS — E785 Hyperlipidemia, unspecified: Secondary | ICD-10-CM | POA: Diagnosis not present

## 2023-03-28 DIAGNOSIS — E860 Dehydration: Secondary | ICD-10-CM

## 2023-03-28 DIAGNOSIS — Z87442 Personal history of urinary calculi: Secondary | ICD-10-CM | POA: Insufficient documentation

## 2023-03-28 DIAGNOSIS — E119 Type 2 diabetes mellitus without complications: Secondary | ICD-10-CM | POA: Diagnosis not present

## 2023-03-28 DIAGNOSIS — C7800 Secondary malignant neoplasm of unspecified lung: Secondary | ICD-10-CM | POA: Diagnosis not present

## 2023-03-28 DIAGNOSIS — I480 Paroxysmal atrial fibrillation: Secondary | ICD-10-CM | POA: Diagnosis not present

## 2023-03-28 DIAGNOSIS — R5383 Other fatigue: Secondary | ICD-10-CM | POA: Diagnosis not present

## 2023-03-28 DIAGNOSIS — G4700 Insomnia, unspecified: Secondary | ICD-10-CM | POA: Insufficient documentation

## 2023-03-28 DIAGNOSIS — I700 Atherosclerosis of aorta: Secondary | ICD-10-CM | POA: Diagnosis not present

## 2023-03-28 DIAGNOSIS — N132 Hydronephrosis with renal and ureteral calculous obstruction: Secondary | ICD-10-CM | POA: Insufficient documentation

## 2023-03-28 DIAGNOSIS — Z95828 Presence of other vascular implants and grafts: Secondary | ICD-10-CM

## 2023-03-28 DIAGNOSIS — Z7901 Long term (current) use of anticoagulants: Secondary | ICD-10-CM | POA: Diagnosis not present

## 2023-03-28 DIAGNOSIS — I82890 Acute embolism and thrombosis of other specified veins: Secondary | ICD-10-CM | POA: Insufficient documentation

## 2023-03-28 LAB — CBC WITH DIFFERENTIAL (CANCER CENTER ONLY)
Abs Immature Granulocytes: 0.03 10*3/uL (ref 0.00–0.07)
Basophils Absolute: 0 10*3/uL (ref 0.0–0.1)
Basophils Relative: 0 %
Eosinophils Absolute: 0.2 10*3/uL (ref 0.0–0.5)
Eosinophils Relative: 3 %
HCT: 34.8 % — ABNORMAL LOW (ref 39.0–52.0)
Hemoglobin: 12 g/dL — ABNORMAL LOW (ref 13.0–17.0)
Immature Granulocytes: 1 %
Lymphocytes Relative: 22 %
Lymphs Abs: 1.3 10*3/uL (ref 0.7–4.0)
MCH: 34.7 pg — ABNORMAL HIGH (ref 26.0–34.0)
MCHC: 34.5 g/dL (ref 30.0–36.0)
MCV: 100.6 fL — ABNORMAL HIGH (ref 80.0–100.0)
Monocytes Absolute: 0.5 10*3/uL (ref 0.1–1.0)
Monocytes Relative: 10 %
Neutro Abs: 3.6 10*3/uL (ref 1.7–7.7)
Neutrophils Relative %: 64 %
Platelet Count: 98 10*3/uL — ABNORMAL LOW (ref 150–400)
RBC: 3.46 MIL/uL — ABNORMAL LOW (ref 4.22–5.81)
RDW: 13.8 % (ref 11.5–15.5)
WBC Count: 5.6 10*3/uL (ref 4.0–10.5)
nRBC: 0 % (ref 0.0–0.2)

## 2023-03-28 LAB — CMP (CANCER CENTER ONLY)
ALT: 21 U/L (ref 0–44)
AST: 21 U/L (ref 15–41)
Albumin: 4 g/dL (ref 3.5–5.0)
Alkaline Phosphatase: 94 U/L (ref 38–126)
Anion gap: 6 (ref 5–15)
BUN: 15 mg/dL (ref 8–23)
CO2: 26 mmol/L (ref 22–32)
Calcium: 8.6 mg/dL — ABNORMAL LOW (ref 8.9–10.3)
Chloride: 108 mmol/L (ref 98–111)
Creatinine: 0.97 mg/dL (ref 0.61–1.24)
GFR, Estimated: >60 mL/min (ref >60)
Glucose, Bld: 189 mg/dL — ABNORMAL HIGH (ref 70–99)
Potassium: 3.9 mmol/L (ref 3.5–5.1)
Sodium: 140 mmol/L (ref 135–145)
Total Bilirubin: 0.5 mg/dL (ref 0.3–1.2)
Total Protein: 6.7 g/dL (ref 6.5–8.1)

## 2023-03-28 MED ORDER — ALPRAZOLAM 0.5 MG PO TABS: 0.5000 mg | ORAL_TABLET | Freq: Three times a day (TID) | ORAL | 0 refills | Status: DC | PRN

## 2023-03-28 MED ORDER — PROCHLORPERAZINE MALEATE 10 MG PO TABS
10.0000 mg | ORAL_TABLET | Freq: Once | ORAL | Status: AC
  Administered 2023-03-28: 10 mg via ORAL
  Filled 2023-03-28: qty 1

## 2023-03-28 MED ORDER — SODIUM CHLORIDE 0.9% FLUSH
10.0000 mL | INTRAVENOUS | Status: DC | PRN
  Administered 2023-03-28: 10 mL

## 2023-03-28 MED ORDER — SODIUM CHLORIDE 0.9 % IV SOLN: Freq: Once | INTRAVENOUS | Status: AC

## 2023-03-28 MED ORDER — LORAZEPAM 1 MG PO TABS: 1.0000 mg | ORAL_TABLET | Freq: Two times a day (BID) | ORAL | 0 refills | Status: DC | PRN

## 2023-03-28 MED ORDER — SODIUM CHLORIDE 0.9% FLUSH
10.0000 mL | Freq: Once | INTRAVENOUS | Status: AC
  Administered 2023-03-28: 10 mL

## 2023-03-28 MED ORDER — PACLITAXEL PROTEIN-BOUND CHEMO INJECTION 100 MG
80.0000 mg/m2 | Freq: Once | INTRAVENOUS | Status: AC
  Administered 2023-03-28: 175 mg via INTRAVENOUS
  Filled 2023-03-28: qty 35

## 2023-03-28 MED ORDER — HEPARIN SOD (PORK) LOCK FLUSH 100 UNIT/ML IV SOLN
500.0000 [IU] | Freq: Once | INTRAVENOUS | Status: AC | PRN
  Administered 2023-03-28: 500 [IU]

## 2023-03-28 MED ORDER — SODIUM CHLORIDE 0.9 % IV SOLN
800.0000 mg/m2 | Freq: Once | INTRAVENOUS | Status: AC
  Administered 2023-03-28: 1634 mg via INTRAVENOUS
  Filled 2023-03-28: qty 42.97

## 2023-03-28 MED ORDER — ZOLPIDEM TARTRATE 10 MG PO TABS
10.0000 mg | ORAL_TABLET | Freq: Every evening | ORAL | 0 refills | Status: DC | PRN
Start: 2023-03-28 — End: 2023-04-25

## 2023-03-28 NOTE — Progress Notes (Signed)
Per Dr. Mosetta Putt, ok to treat with platelets of 98.

## 2023-03-28 NOTE — Patient Instructions (Signed)
[facility]  Discharge Instructions: Thank you for choosing [facility] to provide your oncology and hematology care.   If you have a lab appointment with the Cancer Center, please go directly to the Cancer Center and check in at the registration area.   Wear comfortable clothing and clothing appropriate for easy access to any Portacath or PICC line.   We strive to give you quality time with your provider. You may need to reschedule your appointment if you arrive late (15 or more minutes).  Arriving late affects you and other patients whose appointments are after yours.  Also, if you miss three or more appointments without notifying the office, you may be dismissed from the clinic at the provider's discretion.      For prescription refill requests, have your pharmacy contact our office and allow 72 hours for refills to be completed.    Today you received the following chemotherapy and/or immunotherapy agents: Abraxane and Gemzar      To help prevent nausea and vomiting after your treatment, we encourage you to take your nausea medication as directed.  BELOW ARE SYMPTOMS THAT SHOULD BE REPORTED IMMEDIATELY: *FEVER GREATER THAN 100.4 F (38 C) OR HIGHER *CHILLS OR SWEATING *NAUSEA AND VOMITING THAT IS NOT CONTROLLED WITH YOUR NAUSEA MEDICATION *UNUSUAL SHORTNESS OF BREATH *UNUSUAL BRUISING OR BLEEDING *URINARY PROBLEMS (pain or burning when urinating, or frequent urination) *BOWEL PROBLEMS (unusual diarrhea, constipation, pain near the anus) TENDERNESS IN MOUTH AND THROAT WITH OR WITHOUT PRESENCE OF ULCERS (sore throat, sores in mouth, or a toothache) UNUSUAL RASH, SWELLING OR PAIN  UNUSUAL VAGINAL DISCHARGE OR ITCHING   Items with * indicate a potential emergency and should be followed up as soon as possible or go to the Emergency Department if any problems should occur.  Please show the CHEMOTHERAPY ALERT CARD or IMMUNOTHERAPY ALERT  CARD at check-in to the Emergency Department and triage nurse.  Should you have questions after your visit or need to cancel or reschedule your appointment, please contact [facility]  Dept: [phone]  and follow the prompts.  Office hours are 8:00 a.m. to 4:30 p.m. Monday - Friday. Please note that voicemails left after 4:00 p.m. may not be returned until the following business day.  We are closed weekends and major holidays. You have access to a nurse at all times for urgent questions. Please call the main number to the clinic Dept: [phone] and follow the prompts.   For any non-urgent questions, you may also contact your provider using MyChart. We now offer e-Visits for anyone 18 and older to request care online for non-urgent symptoms. For details visit [email or web address].   Also download the MyChart app! Go to the app store, search "MyChart", open the app, select [facility], and log in with your MyChart username and password.   

## 2023-03-29 LAB — CANCER ANTIGEN 19-9: CA 19-9: 181 U/mL — ABNORMAL HIGH (ref 0–35)

## 2023-03-30 ENCOUNTER — Other Ambulatory Visit: Payer: Self-pay

## 2023-03-31 ENCOUNTER — Other Ambulatory Visit: Payer: Self-pay

## 2023-03-31 MED ORDER — LORAZEPAM 1 MG PO TABS: 1.0000 mg | ORAL_TABLET | Freq: Every evening | ORAL | 0 refills | Status: DC | PRN

## 2023-03-31 NOTE — Addendum Note (Signed)
Addended by: Malachy Mood on: 03/31/2023 11:04 AM   Modules accepted: Orders

## 2023-04-04 ENCOUNTER — Other Ambulatory Visit: Payer: Self-pay

## 2023-04-10 NOTE — Assessment & Plan Note (Addendum)
[id number] with numerous small cavitary lesions in bilateral lungs, MMR normal  -diagnosed 07/2021, after incidental finding on CT scan for kidney stone, by colonoscopy/EUS. -Baseline CA 19-9 elevated to 273 on 07/03/21 -He began first-line mFOLFIRINOX on 08/13/21, but required dose reduction and move to every 3 weeks. -his CA 19-9 trended up but is now stable. -restaging CT CAP 08/12/22 showed: multiple new and enlarging pulmonary metastases; pancreatic mass grossly stable. Will repeat in 3 months. -we changed his irinotecan to liposomal on 09/05/22. He tolerated better and feels stronger. -Due to disease progression, his treatment was changed to gemcitabine and Abraxane every 2 weeks on 01/02/2023 -He tolerated the first two cycles chemotherapy very well with mild fatigue.  I increased gemcitabine to 1000mg /m2 from cycle 3 -Restaging CT scan 03/12/2023 showed stable disease overall. His primary pancreatic tumor is slightly increased in size, however his multiple cavitary lung nodules are slightly improved with thinner walls now.   -will continue current therapy.  He is overall tolerating well -His tumor marker CA 19.9 continues trending down, which is a good clinical sign. -Plan to repeat restaging CT scan in late July.

## 2023-04-10 NOTE — Assessment & Plan Note (Signed)
-  He is on Xanax and Ambien as needed -He still has a few nights of insomnia despite Ambien or Xanax after treatment, he will take Ativan as needed.

## 2023-04-10 NOTE — Assessment & Plan Note (Signed)
-  Patient understands his treatment is palliative, to prolong his life. -He has agreed with DNR.

## 2023-04-11 ENCOUNTER — Encounter: Payer: Self-pay | Admitting: Hematology

## 2023-04-11 ENCOUNTER — Inpatient Hospital Stay (HOSPITAL_BASED_OUTPATIENT_CLINIC_OR_DEPARTMENT_OTHER): Payer: Medicare Other | Admitting: Hematology

## 2023-04-11 ENCOUNTER — Inpatient Hospital Stay: Payer: Medicare Other

## 2023-04-11 ENCOUNTER — Other Ambulatory Visit: Payer: Self-pay

## 2023-04-11 VITALS — BP 108/66 | HR 62 | Temp 98.6°F | Resp 16 | Wt 201.2 lb

## 2023-04-11 DIAGNOSIS — E860 Dehydration: Secondary | ICD-10-CM

## 2023-04-11 DIAGNOSIS — F419 Anxiety disorder, unspecified: Secondary | ICD-10-CM

## 2023-04-11 DIAGNOSIS — Z7189 Other specified counseling: Secondary | ICD-10-CM

## 2023-04-11 DIAGNOSIS — C259 Malignant neoplasm of pancreas, unspecified: Secondary | ICD-10-CM

## 2023-04-11 DIAGNOSIS — Z5111 Encounter for antineoplastic chemotherapy: Secondary | ICD-10-CM | POA: Diagnosis not present

## 2023-04-11 DIAGNOSIS — Z95828 Presence of other vascular implants and grafts: Secondary | ICD-10-CM

## 2023-04-11 LAB — CMP (CANCER CENTER ONLY)
ALT: 17 U/L (ref 0–44)
AST: 18 U/L (ref 15–41)
Albumin: 4 g/dL (ref 3.5–5.0)
Alkaline Phosphatase: 101 U/L (ref 38–126)
Anion gap: 5 (ref 5–15)
BUN: 17 mg/dL (ref 8–23)
CO2: 26 mmol/L (ref 22–32)
Calcium: 8.7 mg/dL — ABNORMAL LOW (ref 8.9–10.3)
Chloride: 110 mmol/L (ref 98–111)
Creatinine: 0.94 mg/dL (ref 0.61–1.24)
GFR, Estimated: >60 mL/min (ref >60)
Glucose, Bld: 101 mg/dL — ABNORMAL HIGH (ref 70–99)
Potassium: 3.8 mmol/L (ref 3.5–5.1)
Sodium: 141 mmol/L (ref 135–145)
Total Bilirubin: 0.4 mg/dL (ref 0.3–1.2)
Total Protein: 6.6 g/dL (ref 6.5–8.1)

## 2023-04-11 LAB — CBC WITH DIFFERENTIAL (CANCER CENTER ONLY)
Abs Immature Granulocytes: 0.07 10*3/uL (ref 0.00–0.07)
Basophils Absolute: 0.1 10*3/uL (ref 0.0–0.1)
Basophils Relative: 1 %
Eosinophils Absolute: 0.4 10*3/uL (ref 0.0–0.5)
Eosinophils Relative: 5 %
HCT: 34.6 % — ABNORMAL LOW (ref 39.0–52.0)
Hemoglobin: 11.7 g/dL — ABNORMAL LOW (ref 13.0–17.0)
Immature Granulocytes: 1 %
Lymphocytes Relative: 18 %
Lymphs Abs: 1.4 10*3/uL (ref 0.7–4.0)
MCH: 34.1 pg — ABNORMAL HIGH (ref 26.0–34.0)
MCHC: 33.8 g/dL (ref 30.0–36.0)
MCV: 100.9 fL — ABNORMAL HIGH (ref 80.0–100.0)
Monocytes Absolute: 0.7 10*3/uL (ref 0.1–1.0)
Monocytes Relative: 9 %
Neutro Abs: 5.3 10*3/uL (ref 1.7–7.7)
Neutrophils Relative %: 66 %
Platelet Count: 104 10*3/uL — ABNORMAL LOW (ref 150–400)
RBC: 3.43 MIL/uL — ABNORMAL LOW (ref 4.22–5.81)
RDW: 14 % (ref 11.5–15.5)
WBC Count: 7.8 10*3/uL (ref 4.0–10.5)
nRBC: 0 % (ref 0.0–0.2)

## 2023-04-11 MED ORDER — SODIUM CHLORIDE 0.9% FLUSH
10.0000 mL | INTRAVENOUS | Status: DC | PRN
  Administered 2023-04-11: 10 mL

## 2023-04-11 MED ORDER — TRAMADOL HCL 50 MG PO TABS
50.0000 mg | ORAL_TABLET | Freq: Four times a day (QID) | ORAL | 0 refills | Status: DC | PRN
Start: 2023-04-11 — End: 2023-05-09

## 2023-04-11 MED ORDER — PACLITAXEL PROTEIN-BOUND CHEMO INJECTION 100 MG
100.0000 mg/m2 | Freq: Once | INTRAVENOUS | Status: AC
  Administered 2023-04-11: 200 mg via INTRAVENOUS
  Filled 2023-04-11: qty 40

## 2023-04-11 MED ORDER — SODIUM CHLORIDE 0.9 % IV SOLN
1000.0000 mg/m2 | Freq: Once | INTRAVENOUS | Status: AC
  Administered 2023-04-11: 2052 mg via INTRAVENOUS
  Filled 2023-04-11: qty 53.97

## 2023-04-11 MED ORDER — SODIUM CHLORIDE 0.9% FLUSH
10.0000 mL | Freq: Once | INTRAVENOUS | Status: AC
  Administered 2023-04-11: 10 mL

## 2023-04-11 MED ORDER — SODIUM CHLORIDE 0.9 % IV SOLN: Freq: Once | INTRAVENOUS | Status: DC

## 2023-04-11 MED ORDER — SODIUM CHLORIDE 0.9 % IV SOLN: Freq: Once | INTRAVENOUS | Status: AC

## 2023-04-11 MED ORDER — PROCHLORPERAZINE MALEATE 10 MG PO TABS
10.0000 mg | ORAL_TABLET | Freq: Once | ORAL | Status: AC
  Administered 2023-04-11: 10 mg via ORAL
  Filled 2023-04-11: qty 1

## 2023-04-11 MED ORDER — HEPARIN SOD (PORK) LOCK FLUSH 100 UNIT/ML IV SOLN
500.0000 [IU] | Freq: Once | INTRAVENOUS | Status: AC | PRN
  Administered 2023-04-11: 500 [IU]

## 2023-04-11 NOTE — Progress Notes (Signed)
[facility]   Telephone:([phone] [phone]   Clinic Follow up Note   Patient Care Team: Lucky Cowboy, MD as PCP - General (Internal Medicine) Pollyann Samples, NP as PCP - Hematology/Oncology (Nurse Practitioner) Thomasene Ripple, DO as PCP - Cardiology (Cardiology) Nadara Mustard, MD as Consulting Physician (Orthopedic Surgery) Laurey Morale, MD as Consulting Physician (Cardiology) Malachy Mood, MD as Consulting Physician (Oncology)  Date of Service:  04/11/2023  CHIEF COMPLAINT: f/u of  metastatic pancreatic cancer     CURRENT THERAPY:  Gemcitabine and Abraxane q28d  ASSESSMENT:  Zachary Reid is a 67 y.o. male with   Pancreatic adenocarcinoma (HCC) [phone] with numerous small cavitary lesions in bilateral lungs, MMR normal  -diagnosed 07/2021, after incidental finding on CT scan for kidney stone, by colonoscopy/EUS. -Baseline CA 19-9 elevated to 273 on 07/03/21 -He began first-line mFOLFIRINOX on 08/13/21, but required dose reduction and move to every 3 weeks. -his CA 19-9 trended up but is now stable. -restaging CT CAP 08/12/22 showed: multiple new and enlarging pulmonary metastases; pancreatic mass grossly stable. Will repeat in 3 months. -we changed his irinotecan to liposomal on 09/05/22. He tolerated better and feels stronger. -Due to disease progression, his treatment was changed to gemcitabine and Abraxane every 2 weeks on 01/02/2023 -He tolerated the first two cycles chemotherapy very well with mild fatigue.  I increased gemcitabine to 1000mg /m2 from cycle 3 -Restaging CT scan 03/12/2023 showed stable disease overall. His primary pancreatic tumor is slightly increased in size, however his multiple cavitary lung nodules are slightly improved with thinner walls now.   -will continue current therapy.  He is overall tolerating well -His tumor marker CA 19.9 continues trending down, which is a good clinical sign. -Plan to repeat restaging CT scan in  late July.  Goals of care, counseling/discussion -Patient understands his treatment is palliative, to prolong his life. -He has agreed with DNR.  Anxiety -He is on Xanax and Ambien as needed -He still has a few nights of insomnia despite Ambien or Xanax after treatment, he will take Ativan as needed.     PLAN: -lab reviewed hg 11.7 -CMP reviewed. -Tumor Marker trending down -recommend taking OTC calcium - I refill Tramadol -Proceed with Abraxane/Gemzar at full dose today -I deferred Treatment for 7/5 due to trip -lab/flush  and f/u 6/7   SUMMARY OF ONCOLOGIC HISTORY: Oncology History Overview Note   Cancer Staging  Pancreatic adenocarcinoma [facility]) Staging form: Exocrine Pancreas, AJCC 8th Edition - Clinical stage from 07/26/2021: Stage IV (cT4, cN0, cM1) - Signed by Malachy Mood, MD on 07/28/2021    Pancreatic adenocarcinoma (HCC)  07/02/2021 Initial Diagnosis   Pancreatic adenocarcinoma (HCC)   07/26/2021 Cancer Staging   Staging form: Exocrine Pancreas, AJCC 8th Edition - Clinical stage from 07/26/2021: Stage IV (cT4, cN0, cM1) - Signed by Malachy Mood, MD on 07/28/2021 Stage prefix: Initial diagnosis Total positive nodes: 0   08/13/2021 - 07/06/2022 Chemotherapy   Patient is on Treatment Plan : PANCREAS Modified FOLFIRINOX q14d x 4 cycles      Genetic Testing   [facility] CancerNext-Expanded results (77 genes) were negative. No pathogenic variants were identified. 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 The report date is 08/15/2021.    The CancerNext-Expanded gene panel offered by [facility] and includes sequencing, rearrangement, and RNA analysis for the following 77 genes: AIP, ALK, APC, ATM, AXIN2, BAP1, BARD1, BLM, BMPR1A, BRCA1, BRCA2, BRIP1, CDC73, CDH1, CDK4, CDKN1B, CDKN2A,  CHEK2, CTNNA1, DICER1, FANCC, FH, FLCN, GALNT12, KIF1B, LZTR1, MAX, MEN1, MET, MLH1, MSH2, MSH3, MSH6, MUTYH, NBN, NF1, NF2, NTHL1, PALB2, PHOX2B, PMS2, POT1, PRKAR1A,  PTCH1, PTEN, RAD51C, RAD51D, RB1, RECQL, RET, SDHA, SDHAF2, SDHB, SDHC, SDHD, SMAD4, SMARCA4, SMARCB1, SMARCE1, STK11, SUFU, TMEM127, TP53, TSC1, TSC2, VHL and XRCC2 (sequencing and deletion/duplication); EGFR, EGLN1, HOXB13, KIT, MITF, PDGFRA, POLD1, and POLE (sequencing only); EPCAM and GREM1 (deletion/duplication only).     08/13/2021 - 12/06/2022 Chemotherapy   Patient is on Treatment Plan : PANCREAS Modified FOLFIRINOX q14d x 4 cycles     10/25/2021 Imaging   EXAM: CT CHEST, ABDOMEN, AND PELVIS WITH CONTRAST  IMPRESSION: 1. Innumerable bilateral pulmonary nodules, many of which are cavitary, consistent with metastatic disease. These nodules show no substantial change in are minimally progressed in the interval. 2. Mix cystic and solid lesion in the head and body of the pancreas is similar to prior and also comparing back to MRI 07/02/2021. 3. Hepatic cysts. 4. 8 mm nonobstructing left renal stone. 5. Aortic Atherosclerosis (ICD10-I70.0).   01/28/2022 Imaging   EXAM: CT CHEST, ABDOMEN, AND PELVIS WITH CONTRAST  IMPRESSION: 1. Innumerable bilateral small solid and cavitary pulmonary nodules, some of the cavitary nodules are slightly less thick-walled when compared with prior exam. 2. Mixed cystic and solid lesion in the head and body of the pancreas is similar to prior exam. 3. Nonobstructing left renal stone. 4.  Aortic Atherosclerosis (ICD10-I70.0).   11/29/2022 Imaging    IMPRESSION: 1. No significant change in primary pancreatic mass and adjacent cystic component. Unchanged appearance of soft tissue extending to involve the adjacent celiac axis, superior mesenteric artery origin, and portal confluence. 2. 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 3. Innumerable small pulmonary nodules throughout the lungs, the majority of which are cavitary. Although interval change is difficult to appreciate for the  majority of these nodules due to small size, at least some of these are slightly enlarged, consistent with worsened pulmonary metastatic disease. 4. Newly enlarged left retroperitoneal lymph nodes, which may reflect nodal metastatic disease or perhaps reactive to left hydronephrosis. Attention on follow-up. 5. A sizable calculus previously seen in the left renal pelvis has migrated to the middle third of the left ureter, with placement of a double-J left ureteral stent, with formed pigtails in the left renal pelvis and urinary bladder. Moderate left hydronephrosis and proximal hydroureter. 6. Coronary artery disease.   Aortic Atherosclerosis (ICD10-I70.0).   01/02/2023 -  Chemotherapy   Patient is on Treatment Plan : PANCREATIC Abraxane D1,8,15 + Gemcitabine D1,8,15 q28d     03/12/2023 Imaging    IMPRESSION: 1. Primary pancreatic mass with adjacent cystic component is slightly increased in size when compared with the prior exam. Similar soft tissue extending involve the celiac axis superior mesenteric artery origin and portal confluence. 2. Innumerable bilateral pulmonary nodules, some of which are cavitary, unchanged when compared with the prior. 3. Left retroperitoneal lymph nodes are decreased in size, likely resolving reactive adenopathy 4. Interval removal of left ureter stent.  No hydronephrosis. 5. coronary artery disease and aortic Atherosclerosis (ICD10-I70.0).      INTERVAL HISTORY:  Adein Tenold is here for a follow up of  metastatic pancreatic cancer . He was last seen by me on 03/28/2023. He presents to the clinic accompanied by wife. Last cycle went well. Pt denies having numbness and tingling.Pt is taking a liquid Iron supplement and he has seen a tremendous change in his  energy. Pt report of some swelling in his feet.   All other systems were reviewed with the patient and are negative.  MEDICAL HISTORY:  Past Medical History:  Diagnosis Date   Adenocarcinoma  of pancreas, stage 4 (HCC) 07/2021   oncologist--- dr Mosetta Putt;   mets to  bilateral lung;  started chemo 08-13-2021   Anemia    GAD (generalized anxiety disorder)    History of adenomatous polyp of colon    Hyperlipidemia    Hypogonadism male    IBS (irritable bowel syndrome)    Insomnia    Left ureteral calculus    Metastatic cancer to lung (HCC) 07/2021   primary pancreatic cancer w/ numerous small cavity lesions bilateral lungs   PAF (paroxysmal atrial fibrillation) (HCC) 10/31/2022   cardiologist-- dr Kirtland Bouchard. tobb;  newly dx ED 10-31-2022 w/ RVR  in setting flank pain (kidney stone)  --  office note in epic 11-07-2022 normal ETT 11-13-2022, echo 11-07-2022 ef 65-70% w/ mild LVH, mild MR,  zio monitor not completed,  started on toprol and xarelto daily   Personal history of chemotherapy    Receiving chemotherapy for metastatic pancreatic cancer. Most recent infusion (as of 12/02/22) was on 11/15/22.   Port-A-Cath in place 08/10/2021   Type 2 diabetes mellitus treated with insulin [facility])    endocrinologist--- dr Lucianne Muss   Vitamin D deficiency    takes Vit D supplements   Wears contact lenses     SURGICAL HISTORY: Past Surgical History:  Procedure Laterality Date   APPENDECTOMY  1988   BIOPSY  07/26/2021   Procedure: BIOPSY;  Surgeon: Lemar Lofty., MD;  Location: [facility] ENDOSCOPY;  Service: Gastroenterology;;   COLONOSCOPY WITH PROPOFOL N/A 07/26/2021   Procedure: COLONOSCOPY WITH PROPOFOL;  Surgeon: Lemar Lofty., MD;  Location: [facility] ENDOSCOPY;  Service: Gastroenterology;  Laterality: N/A;   CYSTOSCOPY WITH STENT PLACEMENT Left 10/31/2022   Procedure: CYSTOSCOPY WITH STENT PLACEMENT;  Surgeon: Belva Agee, MD;  Location: [facility] ORS;  Service: Urology;  Laterality: Left;   CYSTOSCOPY/URETEROSCOPY/HOLMIUM LASER/STENT PLACEMENT Left 12/03/2022   Procedure: CYSTOSCOPY/LEFT URETEROSCOPY/HOLMIUM LASER/STENT EXCHANGE;  Surgeon: Belva Agee, MD;  Location: [facility];  Service: Urology;  Laterality: Left;  1 HR FOR CASE   ESOPHAGOGASTRODUODENOSCOPY (EGD) WITH PROPOFOL N/A 07/26/2021   Procedure: ESOPHAGOGASTRODUODENOSCOPY (EGD) WITH PROPOFOL;  Surgeon: Meridee Score Netty Starring., MD;  Location: [facility] ENDOSCOPY;  Service: Gastroenterology;  Laterality: N/A;   EUS N/A 07/26/2021   Procedure: UPPER ENDOSCOPIC ULTRASOUND (EUS) RADIAL;  Surgeon: Lemar Lofty., MD;  Location: [facility] ENDOSCOPY;  Service: Gastroenterology;  Laterality: N/A;   FINE NEEDLE ASPIRATION  07/26/2021   Procedure: FINE NEEDLE ASPIRATION (FNA) LINEAR;  Surgeon: Meridee Score Netty Starring., MD;  Location: [facility] ENDOSCOPY;  Service: Gastroenterology;;   IR IMAGING GUIDED PORT INSERTION  08/10/2021   POLYPECTOMY  07/26/2021   Procedure: POLYPECTOMY;  Surgeon: Meridee Score Netty Starring., MD;  Location: [facility] ENDOSCOPY;  Service: Gastroenterology;;   SHOULDER SURGERY Right    as a teenager    I have reviewed the social history and family history with the patient and they are unchanged from previous note.  ALLERGIES:  is allergic to lipitor [atorvastatin].  MEDICATIONS:  Current Outpatient Medications  Medication Sig Dispense Refill   ALPRAZolam (XANAX) 0.5 MG tablet Take 1 tablet (0.5 mg total) by mouth 3 (three) times daily as needed for anxiety. 60 tablet 0   Cholecalciferol (VITAMIN D3) 125 MCG (5000 UT) CAPS Take 5,000 Units by mouth in the morning  and at bedtime.     Continuous Blood Gluc Sensor (DEXCOM G7 SENSOR) MISC 1 Device by Does not apply route as directed. Change sensor every 10 days (Patient taking differently: 1 Device by Does not apply route as directed. Change sensor every 10 days Per patient on 12/02/22, he has not yet received glucose monitor.) 3 each 3   gabapentin (NEURONTIN) 600 MG tablet Take 1 tablet (600 mg total) by mouth 3 (three) times daily as needed (pain). 60 tablet 1   glucose blood test strip Use as instructed 100 each 12   HYDROcodone-acetaminophen (NORCO/VICODIN)  5-325 MG tablet Take 1 tablet by mouth every 4 (four) hours as needed for moderate pain. (Patient not taking: Reported on 12/02/2022) 20 tablet 0   hyoscyamine (LEVSIN SL) 0.125 MG SL tablet DISSOLVE ONE TABLET UNDER THE TONGUE 4 TIMES DAILY UP TO EVERY 4 HOURS AS NEEDED FOR NAUSEA, BLOATING, CRAMPING, OR DIARRHEA (Patient not taking: Reported on 12/02/2022) 100 tablet 0   insulin degludec (TRESIBA FLEXTOUCH) 100 UNIT/ML FlexTouch Pen INJECT 32 UNITS INTO THE SKIN DAILY. 15 mL 1   insulin lispro (HUMALOG KWIKPEN) 100 UNIT/ML KwikPen 10 TO 15 UNITS BEFORE MEALS AS DIRECTED (Patient taking differently: Inject 10-12 Units into the skin 3 (three) times daily before meals.) 15 mL 1   LORazepam (ATIVAN) 1 MG tablet Take 1 tablet (1 mg total) by mouth at bedtime as needed for sleep. 20 tablet 0   metoprolol succinate (TOPROL XL) 25 MG 24 hr tablet Take 0.5 tablets (12.5 mg total) by mouth daily. (Patient taking differently: Take 12.5 mg by mouth daily. Per patient, he will start taking Metoprolol on 12/02/22 and take again on the day of surgery (12/03/22.)) 45 tablet 3   oxyCODONE (ROXICODONE) 5 MG immediate release tablet Take 1 tablet (5 mg total) by mouth every 6 (six) hours as needed for up to 30 doses for severe pain. 30 tablet 0   potassium chloride (KLOR-CON M) 10 MEQ tablet Take 1 tablet (10 mEq total) by mouth 2 (two) times daily. 14 tablet 0   prochlorperazine (COMPAZINE) 10 MG tablet Take 1 tablet (10 mg total) by mouth every 6 (six) hours as needed (Nausea or vomiting). 60 tablet 1   rivaroxaban (XARELTO) 20 MG TABS tablet Take 1 tablet (20 mg total) by mouth daily with supper. (Patient taking differently: Take 20 mg by mouth daily with supper. Patient stated that he last took Xarelto on 11/30/22.) 90 tablet 3   traMADol (ULTRAM) 50 MG tablet Take 1-2 tablets (50-100 mg total) by mouth every 6 (six) hours as needed. 120 tablet 0   zolpidem (AMBIEN) 10 MG tablet Take 1 tablet (10 mg total) by mouth at  bedtime as needed. for sleep 30 tablet 0   No current facility-administered medications for this visit.   Facility-Administered Medications Ordered in Other Visits  Medication Dose Route Frequency Provider Last Rate Last Admin   0.9 %  sodium chloride infusion   Intravenous Once Malachy Mood, MD       gemcitabine (GEMZAR) 2,052 mg in sodium chloride 0.9 % 250 mL chemo infusion  1,000 mg/m2 (Treatment Plan Recorded) Intravenous Once Malachy Mood, MD       heparin lock flush 100 unit/mL  500 Units Intracatheter Once PRN Malachy Mood, MD       PACLitaxel-protein bound (ABRAXANE) chemo infusion 200 mg  100 mg/m2 (Treatment Plan Recorded) Intravenous Once Malachy Mood, MD       sodium chloride flush (NS) 0.9 %  injection 10 mL  10 mL Intracatheter PRN Malachy Mood, MD        PHYSICAL EXAMINATION: ECOG PERFORMANCE STATUS: 1 - Symptomatic but completely ambulatory  Vitals:   04/11/23 0847  BP: 108/66  Pulse: 62  Resp: 16  Temp: 98.6 F (37 C)  SpO2: 99%   Wt Readings from Last 3 Encounters:  04/11/23 201 lb 3.2 oz (91.3 kg)  03/28/23 197 lb 8 oz (89.6 kg)  03/14/23 199 lb 12.8 oz (90.6 kg)   GENERAL:alert, no distress and comfortable SKIN: skin color normal, no rashes or significant lesions EYES: normal, Conjunctiva are pink and non-injected, sclera clear  NEURO: alert & oriented x 3 with fluent speech    LABORATORY DATA:  I have reviewed the data as listed    Latest Ref Rng & Units 04/11/2023    8:13 AM 03/28/2023    8:08 AM 03/14/2023    8:14 AM  CBC  WBC 4.0 - 10.5 K/uL 7.8  5.6  5.4   Hemoglobin 13.0 - 17.0 g/dL 16.1  09.6  04.5   Hematocrit 39.0 - 52.0 % 34.6  34.8  33.1   Platelets 150 - 400 K/uL 104  98  97         Latest Ref Rng & Units 04/11/2023    8:13 AM 03/28/2023    8:08 AM 03/14/2023    8:14 AM  CMP  Glucose 70 - 99 mg/dL 409  811  914   BUN 8 - 23 mg/dL 17  15  16    Creatinine 0.61 - 1.24 mg/dL 7.82  9.56  2.13   Sodium 135 - 145 mmol/L 141  140  142   Potassium 3.5 -  5.1 mmol/L 3.8  3.9  3.4   Chloride 98 - 111 mmol/L 110  108  111   CO2 22 - 32 mmol/L 26  26  26    Calcium 8.9 - 10.3 mg/dL 8.7  8.6  8.8   Total Protein 6.5 - 8.1 g/dL 6.6  6.7  6.3   Total Bilirubin 0.3 - 1.2 mg/dL 0.4  0.5  0.5   Alkaline Phos 38 - 126 U/L 101  94  96   AST 15 - 41 U/L 18  21  21    ALT 0 - 44 U/L 17  21  23        RADIOGRAPHIC STUDIES: I have personally reviewed the radiological images as listed and agreed with the findings in the report. No results found.    No orders of the defined types were placed in this encounter.  All questions were answered. The patient knows to call the clinic with any problems, questions or concerns. No barriers to learning was detected. The total time spent in the appointment was 25 minutes.     Malachy Mood, MD 04/11/2023   Carolin Coy, CMA, am acting as scribe for Malachy Mood, MD.   I have reviewed the above documentation for accuracy and completeness, and I agree with the above.

## 2023-04-11 NOTE — Progress Notes (Signed)
Abraxane and Gem are full dose today.  Anola Gurney [place], [place], BCPS, BCOP 04/11/2023 9:43 AM

## 2023-04-11 NOTE — Patient Instructions (Signed)
[facility]  Discharge Instructions: Thank you for choosing [facility] to provide your oncology and hematology care.   If you have a lab appointment with the Cancer Center, please go directly to the Cancer Center and check in at the registration area.   Wear comfortable clothing and clothing appropriate for easy access to any Portacath or PICC line.   We strive to give you quality time with your provider. You may need to reschedule your appointment if you arrive late (15 or more minutes).  Arriving late affects you and other patients whose appointments are after yours.  Also, if you miss three or more appointments without notifying the office, you may be dismissed from the clinic at the provider's discretion.      For prescription refill requests, have your pharmacy contact our office and allow 72 hours for refills to be completed.    Today you received the following chemotherapy and/or immunotherapy agents: Abraxane, Gemcitabine.       To help prevent nausea and vomiting after your treatment, we encourage you to take your nausea medication as directed.  BELOW ARE SYMPTOMS THAT SHOULD BE REPORTED IMMEDIATELY: *FEVER GREATER THAN 100.4 F (38 C) OR HIGHER *CHILLS OR SWEATING *NAUSEA AND VOMITING THAT IS NOT CONTROLLED WITH YOUR NAUSEA MEDICATION *UNUSUAL SHORTNESS OF BREATH *UNUSUAL BRUISING OR BLEEDING *URINARY PROBLEMS (pain or burning when urinating, or frequent urination) *BOWEL PROBLEMS (unusual diarrhea, constipation, pain near the anus) TENDERNESS IN MOUTH AND THROAT WITH OR WITHOUT PRESENCE OF ULCERS (sore throat, sores in mouth, or a toothache) UNUSUAL RASH, SWELLING OR PAIN  UNUSUAL VAGINAL DISCHARGE OR ITCHING   Items with * indicate a potential emergency and should be followed up as soon as possible or go to the Emergency Department if any problems should occur.  Please show the CHEMOTHERAPY ALERT CARD or IMMUNOTHERAPY ALERT  CARD at check-in to the Emergency Department and triage nurse.  Should you have questions after your visit or need to cancel or reschedule your appointment, please contact [facility]  Dept: [phone]  and follow the prompts.  Office hours are 8:00 a.m. to 4:30 p.m. Monday - Friday. Please note that voicemails left after 4:00 p.m. may not be returned until the following business day.  We are closed weekends and major holidays. You have access to a nurse at all times for urgent questions. Please call the main number to the clinic Dept: [phone] and follow the prompts.   For any non-urgent questions, you may also contact your provider using MyChart. We now offer e-Visits for anyone 18 and older to request care online for non-urgent symptoms. For details visit [email or web address].   Also download the MyChart app! Go to the app store, search "MyChart", open the app, select [place], and log in with your MyChart username and password.   

## 2023-04-17 ENCOUNTER — Other Ambulatory Visit: Payer: Self-pay

## 2023-04-24 NOTE — Assessment & Plan Note (Signed)
-  Patient understands his treatment is palliative, to prolong his life. -He has agreed with DNR. 

## 2023-04-24 NOTE — Assessment & Plan Note (Signed)
[id number] with numerous small cavitary lesions in bilateral lungs, MMR normal  -diagnosed 07/2021, after incidental finding on CT scan for kidney stone, by colonoscopy/EUS. -Baseline CA 19-9 elevated to 273 on 07/03/21 -He began first-line mFOLFIRINOX on 08/13/21, but required dose reduction and move to every 3 weeks. -his CA 19-9 trended up but is now stable. -restaging CT CAP 08/12/22 showed: multiple new and enlarging pulmonary metastases; pancreatic mass grossly stable. Will repeat in 3 months. -we changed his irinotecan to liposomal on 09/05/22. He tolerated better and feels stronger. -Due to disease progression, his treatment was changed to gemcitabine and Abraxane every 2 weeks on 01/02/2023 -He tolerated the first two cycles chemotherapy very well with mild fatigue.  I increased gemcitabine to 1000mg/m2 from cycle 3 -Restaging CT scan 03/12/2023 showed stable disease overall. His primary pancreatic tumor is slightly increased in size, however his multiple cavitary lung nodules are slightly improved with thinner walls now.   -will continue current therapy.  He is overall tolerating well -His tumor marker CA 19.9 continues trending down, which is a good clinical sign. -Plan to repeat restaging CT scan in late July. 

## 2023-04-24 NOTE — Assessment & Plan Note (Signed)
-  He is on Xanax and Ambien as needed -He still has a few nights of insomnia despite Ambien or Xanax after treatment, he will take Ativan as needed. 

## 2023-04-25 ENCOUNTER — Encounter: Payer: Self-pay | Admitting: Hematology

## 2023-04-25 ENCOUNTER — Other Ambulatory Visit: Payer: Self-pay

## 2023-04-25 ENCOUNTER — Inpatient Hospital Stay (HOSPITAL_BASED_OUTPATIENT_CLINIC_OR_DEPARTMENT_OTHER): Payer: Medicare Other | Admitting: Hematology

## 2023-04-25 ENCOUNTER — Inpatient Hospital Stay: Payer: Medicare Other | Attending: Hematology

## 2023-04-25 ENCOUNTER — Inpatient Hospital Stay: Payer: Medicare Other

## 2023-04-25 VITALS — BP 127/79 | HR 72 | Resp 16

## 2023-04-25 VITALS — BP 118/74 | HR 84 | Temp 97.9°F | Resp 18 | Ht 71.0 in | Wt 198.6 lb

## 2023-04-25 DIAGNOSIS — K7689 Other specified diseases of liver: Secondary | ICD-10-CM | POA: Diagnosis not present

## 2023-04-25 DIAGNOSIS — I700 Atherosclerosis of aorta: Secondary | ICD-10-CM | POA: Diagnosis not present

## 2023-04-25 DIAGNOSIS — Z86010 Personal history of colonic polyps: Secondary | ICD-10-CM | POA: Insufficient documentation

## 2023-04-25 DIAGNOSIS — Z794 Long term (current) use of insulin: Secondary | ICD-10-CM | POA: Insufficient documentation

## 2023-04-25 DIAGNOSIS — C259 Malignant neoplasm of pancreas, unspecified: Secondary | ICD-10-CM | POA: Diagnosis not present

## 2023-04-25 DIAGNOSIS — I480 Paroxysmal atrial fibrillation: Secondary | ICD-10-CM | POA: Diagnosis not present

## 2023-04-25 DIAGNOSIS — Z7901 Long term (current) use of anticoagulants: Secondary | ICD-10-CM | POA: Diagnosis not present

## 2023-04-25 DIAGNOSIS — E785 Hyperlipidemia, unspecified: Secondary | ICD-10-CM | POA: Insufficient documentation

## 2023-04-25 DIAGNOSIS — K589 Irritable bowel syndrome without diarrhea: Secondary | ICD-10-CM | POA: Diagnosis not present

## 2023-04-25 DIAGNOSIS — I82890 Acute embolism and thrombosis of other specified veins: Secondary | ICD-10-CM | POA: Diagnosis not present

## 2023-04-25 DIAGNOSIS — I2510 Atherosclerotic heart disease of native coronary artery without angina pectoris: Secondary | ICD-10-CM | POA: Diagnosis not present

## 2023-04-25 DIAGNOSIS — Z87442 Personal history of urinary calculi: Secondary | ICD-10-CM | POA: Insufficient documentation

## 2023-04-25 DIAGNOSIS — E119 Type 2 diabetes mellitus without complications: Secondary | ICD-10-CM | POA: Insufficient documentation

## 2023-04-25 DIAGNOSIS — Z9221 Personal history of antineoplastic chemotherapy: Secondary | ICD-10-CM | POA: Insufficient documentation

## 2023-04-25 DIAGNOSIS — F419 Anxiety disorder, unspecified: Secondary | ICD-10-CM

## 2023-04-25 DIAGNOSIS — C7800 Secondary malignant neoplasm of unspecified lung: Secondary | ICD-10-CM | POA: Insufficient documentation

## 2023-04-25 DIAGNOSIS — Z95828 Presence of other vascular implants and grafts: Secondary | ICD-10-CM

## 2023-04-25 DIAGNOSIS — Z7189 Other specified counseling: Secondary | ICD-10-CM | POA: Diagnosis not present

## 2023-04-25 DIAGNOSIS — G4700 Insomnia, unspecified: Secondary | ICD-10-CM | POA: Insufficient documentation

## 2023-04-25 DIAGNOSIS — N132 Hydronephrosis with renal and ureteral calculous obstruction: Secondary | ICD-10-CM | POA: Diagnosis not present

## 2023-04-25 DIAGNOSIS — E860 Dehydration: Secondary | ICD-10-CM

## 2023-04-25 DIAGNOSIS — Z5111 Encounter for antineoplastic chemotherapy: Secondary | ICD-10-CM | POA: Diagnosis present

## 2023-04-25 DIAGNOSIS — Z79899 Other long term (current) drug therapy: Secondary | ICD-10-CM | POA: Diagnosis not present

## 2023-04-25 LAB — CBC WITH DIFFERENTIAL (CANCER CENTER ONLY)
Abs Immature Granulocytes: 0.04 10*3/uL (ref 0.00–0.07)
Basophils Absolute: 0 10*3/uL (ref 0.0–0.1)
Basophils Relative: 0 %
Eosinophils Absolute: 0.2 10*3/uL (ref 0.0–0.5)
Eosinophils Relative: 4 %
HCT: 34.7 % — ABNORMAL LOW (ref 39.0–52.0)
Hemoglobin: 11.7 g/dL — ABNORMAL LOW (ref 13.0–17.0)
Immature Granulocytes: 1 %
Lymphocytes Relative: 20 %
Lymphs Abs: 1.1 10*3/uL (ref 0.7–4.0)
MCH: 34.2 pg — ABNORMAL HIGH (ref 26.0–34.0)
MCHC: 33.7 g/dL (ref 30.0–36.0)
MCV: 101.5 fL — ABNORMAL HIGH (ref 80.0–100.0)
Monocytes Absolute: 0.5 10*3/uL (ref 0.1–1.0)
Monocytes Relative: 10 %
Neutro Abs: 3.6 10*3/uL (ref 1.7–7.7)
Neutrophils Relative %: 65 %
Platelet Count: 104 10*3/uL — ABNORMAL LOW (ref 150–400)
RBC: 3.42 MIL/uL — ABNORMAL LOW (ref 4.22–5.81)
RDW: 14.5 % (ref 11.5–15.5)
WBC Count: 5.5 10*3/uL (ref 4.0–10.5)
nRBC: 0 % (ref 0.0–0.2)

## 2023-04-25 LAB — CMP (CANCER CENTER ONLY)
ALT: 20 U/L (ref 0–44)
AST: 21 U/L (ref 15–41)
Albumin: 3.9 g/dL (ref 3.5–5.0)
Alkaline Phosphatase: 89 U/L (ref 38–126)
Anion gap: 6 (ref 5–15)
BUN: 17 mg/dL (ref 8–23)
CO2: 27 mmol/L (ref 22–32)
Calcium: 9.1 mg/dL (ref 8.9–10.3)
Chloride: 106 mmol/L (ref 98–111)
Creatinine: 1.02 mg/dL (ref 0.61–1.24)
GFR, Estimated: >60 mL/min (ref >60)
Glucose, Bld: 230 mg/dL — ABNORMAL HIGH (ref 70–99)
Potassium: 3.9 mmol/L (ref 3.5–5.1)
Sodium: 139 mmol/L (ref 135–145)
Total Bilirubin: 0.5 mg/dL (ref 0.3–1.2)
Total Protein: 6.8 g/dL (ref 6.5–8.1)

## 2023-04-25 MED ORDER — SODIUM CHLORIDE 0.9% FLUSH
10.0000 mL | Freq: Once | INTRAVENOUS | Status: AC
  Administered 2023-04-25: 10 mL

## 2023-04-25 MED ORDER — SODIUM CHLORIDE 0.9 % IV SOLN
1000.0000 mg/m2 | Freq: Once | INTRAVENOUS | Status: AC
  Administered 2023-04-25: 2052 mg via INTRAVENOUS
  Filled 2023-04-25: qty 53.97

## 2023-04-25 MED ORDER — PROCHLORPERAZINE MALEATE 10 MG PO TABS
10.0000 mg | ORAL_TABLET | Freq: Once | ORAL | Status: AC
  Administered 2023-04-25: 10 mg via ORAL
  Filled 2023-04-25: qty 1

## 2023-04-25 MED ORDER — PACLITAXEL PROTEIN-BOUND CHEMO INJECTION 100 MG
100.0000 mg/m2 | Freq: Once | INTRAVENOUS | Status: AC
  Administered 2023-04-25: 200 mg via INTRAVENOUS
  Filled 2023-04-25: qty 40

## 2023-04-25 MED ORDER — ZOLPIDEM TARTRATE 10 MG PO TABS
10.0000 mg | ORAL_TABLET | Freq: Every evening | ORAL | 0 refills | Status: DC | PRN
Start: 2023-04-25 — End: 2023-05-09

## 2023-04-25 MED ORDER — SODIUM CHLORIDE 0.9 % IV SOLN: Freq: Once | INTRAVENOUS | Status: AC

## 2023-04-25 MED ORDER — SODIUM CHLORIDE 0.9% FLUSH
10.0000 mL | INTRAVENOUS | Status: DC | PRN
  Administered 2023-04-25: 10 mL

## 2023-04-25 MED ORDER — HEPARIN SOD (PORK) LOCK FLUSH 100 UNIT/ML IV SOLN
500.0000 [IU] | Freq: Once | INTRAVENOUS | Status: AC | PRN
  Administered 2023-04-25: 500 [IU]

## 2023-04-25 NOTE — Progress Notes (Signed)
[facility]   [phone] [phone]   Clinic Follow up Note   Patient Care Team: Lucky Cowboy, MD as PCP - General (Internal Medicine) Pollyann Samples, NP as PCP - Hematology/Oncology (Nurse Practitioner) Thomasene Ripple, DO as PCP - Cardiology (Cardiology) Nadara Mustard, MD as Consulting Physician (Orthopedic Surgery) Laurey Morale, MD as Consulting Physician (Cardiology) Malachy Mood, MD as Consulting Physician (Oncology)  Date of Service:  04/25/2023  CHIEF COMPLAINT: f/u of metastatic pancreatic cancer   CURRENT THERAPY:  Gemcitabine and Abraxane q28d   ASSESSMENT:  Zachary Reid is a 67 y.o. male with   Pancreatic adenocarcinoma (HCC) [phone] with numerous small cavitary lesions in bilateral lungs, MMR normal  -diagnosed 07/2021, after incidental finding on CT scan for kidney stone, by colonoscopy/EUS. -Baseline CA 19-9 elevated to 273 on 07/03/21 -He began first-line mFOLFIRINOX on 08/13/21, but required dose reduction and move to every 3 weeks. -his CA 19-9 trended up but is now stable. -restaging CT CAP 08/12/22 showed: multiple new and enlarging pulmonary metastases; pancreatic mass grossly stable. Will repeat in 3 months. -we changed his irinotecan to liposomal on 09/05/22. He tolerated better and feels stronger. -Due to disease progression, his treatment was changed to gemcitabine and Abraxane every 2 weeks on 01/02/2023 -He tolerated the first two cycles chemotherapy very well with mild fatigue.  I increased gemcitabine to 1000mg /m2 from cycle 3 -Restaging CT scan 03/12/2023 showed stable disease overall. His primary pancreatic tumor is slightly increased in size, however his multiple cavitary lung nodules are slightly improved with thinner walls now.   -will continue current therapy.  He is overall tolerating well -His tumor marker CA 19.9 continues trending down, which is a good clinical sign. -Plan to repeat restaging CT scan in late  July.  Goals of care, counseling/discussion -Patient understands his treatment is palliative, to prolong his life. -He has agreed with DNR.    Anxiety -He is on Xanax and Ambien as needed -He still has a few nights of insomnia despite Ambien or Xanax after treatment, he will take Ativan as needed.       PLAN: -lab reviewed -I refill Ambien -proceed with Abraxane/Gemzar with same dose  -lab/flush and f/u 6/21, will order CT scan on next visit   SUMMARY OF ONCOLOGIC HISTORY: Oncology History Overview Note   Cancer Staging  Pancreatic adenocarcinoma [facility]) Staging form: Exocrine Pancreas, AJCC 8th Edition - Clinical stage from 07/26/2021: Stage IV (cT4, cN0, cM1) - Signed by Malachy Mood, MD on 07/28/2021    Pancreatic adenocarcinoma (HCC)  07/02/2021 Initial Diagnosis   Pancreatic adenocarcinoma (HCC)   07/26/2021 Cancer Staging   Staging form: Exocrine Pancreas, AJCC 8th Edition - Clinical stage from 07/26/2021: Stage IV (cT4, cN0, cM1) - Signed by Malachy Mood, MD on 07/28/2021 Stage prefix: Initial diagnosis Total positive nodes: 0   08/13/2021 - 07/06/2022 Chemotherapy   Patient is on Treatment Plan : PANCREAS Modified FOLFIRINOX q14d x 4 cycles      Genetic Testing   [facility] CancerNext-Expanded results (77 genes) were negative. No pathogenic variants were identified. 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 The report date is 08/15/2021.    The CancerNext-Expanded gene panel offered by [facility] and includes sequencing, rearrangement, and RNA analysis for the following 77 genes: AIP, ALK, APC, ATM, AXIN2, BAP1, BARD1, BLM, BMPR1A, BRCA1, BRCA2, BRIP1, CDC73, CDH1, CDK4, CDKN1B, CDKN2A, CHEK2, CTNNA1, DICER1, FANCC, FH, FLCN, GALNT12, KIF1B, LZTR1, MAX, MEN1, MET, MLH1,  MSH2, MSH3, MSH6, MUTYH, NBN, NF1, NF2, NTHL1, PALB2, PHOX2B, PMS2, POT1, PRKAR1A, PTCH1, PTEN, RAD51C, RAD51D, RB1, RECQL, RET, SDHA, SDHAF2, SDHB, SDHC, SDHD, SMAD4, SMARCA4,  SMARCB1, SMARCE1, STK11, SUFU, TMEM127, TP53, TSC1, TSC2, VHL and XRCC2 (sequencing and deletion/duplication); EGFR, EGLN1, HOXB13, KIT, MITF, PDGFRA, POLD1, and POLE (sequencing only); EPCAM and GREM1 (deletion/duplication only).     08/13/2021 - 12/06/2022 Chemotherapy   Patient is on Treatment Plan : PANCREAS Modified FOLFIRINOX q14d x 4 cycles     10/25/2021 Imaging   EXAM: CT CHEST, ABDOMEN, AND PELVIS WITH CONTRAST  IMPRESSION: 1. Innumerable bilateral pulmonary nodules, many of which are cavitary, consistent with metastatic disease. These nodules show no substantial change in are minimally progressed in the interval. 2. Mix cystic and solid lesion in the head and body of the pancreas is similar to prior and also comparing back to MRI 07/02/2021. 3. Hepatic cysts. 4. 8 mm nonobstructing left renal stone. 5. Aortic Atherosclerosis (ICD10-I70.0).   01/28/2022 Imaging   EXAM: CT CHEST, ABDOMEN, AND PELVIS WITH CONTRAST  IMPRESSION: 1. Innumerable bilateral small solid and cavitary pulmonary nodules, some of the cavitary nodules are slightly less thick-walled when compared with prior exam. 2. Mixed cystic and solid lesion in the head and body of the pancreas is similar to prior exam. 3. Nonobstructing left renal stone. 4.  Aortic Atherosclerosis (ICD10-I70.0).   11/29/2022 Imaging    IMPRESSION: 1. No significant change in primary pancreatic mass and adjacent cystic component. Unchanged appearance of soft tissue extending to involve the adjacent celiac axis, superior mesenteric artery origin, and portal confluence. 2. 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 3. Innumerable small pulmonary nodules throughout the lungs, the majority of which are cavitary. Although interval change is difficult to appreciate for the majority of these nodules due to small size, at least some of these are slightly enlarged,  consistent with worsened pulmonary metastatic disease. 4. Newly enlarged left retroperitoneal lymph nodes, which may reflect nodal metastatic disease or perhaps reactive to left hydronephrosis. Attention on follow-up. 5. A sizable calculus previously seen in the left renal pelvis has migrated to the middle third of the left ureter, with placement of a double-J left ureteral stent, with formed pigtails in the left renal pelvis and urinary bladder. Moderate left hydronephrosis and proximal hydroureter. 6. Coronary artery disease.   Aortic Atherosclerosis (ICD10-I70.0).   01/02/2023 -  Chemotherapy   Patient is on Treatment Plan : PANCREATIC Abraxane D1,8,15 + Gemcitabine D1,8,15 q28d     03/12/2023 Imaging    IMPRESSION: 1. Primary pancreatic mass with adjacent cystic component is slightly increased in size when compared with the prior exam. Similar soft tissue extending involve the celiac axis superior mesenteric artery origin and portal confluence. 2. Innumerable bilateral pulmonary nodules, some of which are cavitary, unchanged when compared with the prior. 3. Left retroperitoneal lymph nodes are decreased in size, likely resolving reactive adenopathy 4. Interval removal of left ureter stent.  No hydronephrosis. 5. coronary artery disease and aortic Atherosclerosis (ICD10-I70.0).      INTERVAL HISTORY:  Zachary Reid is here for a follow up of metastatic pancreatic cancer . He was last seen by me on 04/11/2023. He presents to the clinic alone. Pt state that nothing has changed. His energy level is still low. Pt denies having fever and chills, but states he is able to recover within the two period of receiving chemo. Pt state that he has vacation plan at the  end of August.   All other systems were reviewed with the patient and are negative.  MEDICAL HISTORY:  Past Medical History:  Diagnosis Date   Adenocarcinoma of pancreas, stage 4 (HCC) 07/2021   oncologist--- dr Mosetta Putt;    mets to  bilateral lung;  started chemo 08-13-2021   Anemia    GAD (generalized anxiety disorder)    History of adenomatous polyp of colon    Hyperlipidemia    Hypogonadism male    IBS (irritable bowel syndrome)    Insomnia    Left ureteral calculus    Metastatic cancer to lung (HCC) 07/2021   primary pancreatic cancer w/ numerous small cavity lesions bilateral lungs   PAF (paroxysmal atrial fibrillation) (HCC) 10/31/2022   cardiologist-- dr Kirtland Bouchard. tobb;  newly dx ED 10-31-2022 w/ RVR  in setting flank pain (kidney stone)  --  office note in epic 11-07-2022 normal ETT 11-13-2022, echo 11-07-2022 ef 65-70% w/ mild LVH, mild MR,  zio monitor not completed,  started on toprol and xarelto daily   Personal history of chemotherapy    Receiving chemotherapy for metastatic pancreatic cancer. Most recent infusion (as of 12/02/22) was on 11/15/22.   Port-A-Cath in place 08/10/2021   Type 2 diabetes mellitus treated with insulin [facility])    endocrinologist--- dr Lucianne Muss   Vitamin D deficiency    takes Vit D supplements   Wears contact lenses     SURGICAL HISTORY: Past Surgical History:  Procedure Laterality Date   APPENDECTOMY  1988   BIOPSY  07/26/2021   Procedure: BIOPSY;  Surgeon: Lemar Lofty., MD;  Location: [facility] ENDOSCOPY;  Service: Gastroenterology;;   COLONOSCOPY WITH PROPOFOL N/A 07/26/2021   Procedure: COLONOSCOPY WITH PROPOFOL;  Surgeon: Lemar Lofty., MD;  Location: [facility] ENDOSCOPY;  Service: Gastroenterology;  Laterality: N/A;   CYSTOSCOPY WITH STENT PLACEMENT Left 10/31/2022   Procedure: CYSTOSCOPY WITH STENT PLACEMENT;  Surgeon: Belva Agee, MD;  Location: [facility] ORS;  Service: Urology;  Laterality: Left;   CYSTOSCOPY/URETEROSCOPY/HOLMIUM LASER/STENT PLACEMENT Left 12/03/2022   Procedure: CYSTOSCOPY/LEFT URETEROSCOPY/HOLMIUM LASER/STENT EXCHANGE;  Surgeon: Belva Agee, MD;  Location: [facility];  Service: Urology;  Laterality: Left;  1 HR FOR CASE    ESOPHAGOGASTRODUODENOSCOPY (EGD) WITH PROPOFOL N/A 07/26/2021   Procedure: ESOPHAGOGASTRODUODENOSCOPY (EGD) WITH PROPOFOL;  Surgeon: Meridee Score Netty Starring., MD;  Location: [facility] ENDOSCOPY;  Service: Gastroenterology;  Laterality: N/A;   EUS N/A 07/26/2021   Procedure: UPPER ENDOSCOPIC ULTRASOUND (EUS) RADIAL;  Surgeon: Lemar Lofty., MD;  Location: [facility] END[facility]COPY;  Service: Gastroenterology;  Laterality: N/A;   FINE NEEDLE ASPIRATION  07/26/2021   Procedure: FINE NEEDLE ASPIRATION (FNA) LINEAR;  Surgeon: Meridee Score Netty Starring., MD;  Location: [facility] ENDOSCOPY;  Service: Gastroenterology;;   IR IMAGING GUIDED PORT INSERTION  08/10/2021   POLYPECTOMY  07/26/2021   Procedure: POLYPECTOMY;  Surgeon: Meridee Score Netty Starring., MD;  Location: [facility] ENDOSCOPY;  Service: Gastroenterology;;   SHOULDER SURGERY Right    as a teenager    I have reviewed the social history and family history with the patient and they are unchanged from previous note.  ALLERGIES:  is allergic to lipitor [atorvastatin].  MEDICATIONS:  Current Outpatient Medications  Medication Sig Dispense Refill   ALPRAZolam (XANAX) 0.5 MG tablet Take 1 tablet (0.5 mg total) by mouth 3 (three) times daily as needed for anxiety. 60 tablet 0   Cholecalciferol (VITAMIN D3) 125 MCG (5000 UT) CAPS Take 5,000 Units by mouth in the morning and at bedtime.  Continuous Blood Gluc Sensor (DEXCOM G7 SENSOR) MISC 1 Device by Does not apply route as directed. Change sensor every 10 days (Patient taking differently: 1 Device by Does not apply route as directed. Change sensor every 10 days Per patient on 12/02/22, he has not yet received glucose monitor.) 3 each 3   gabapentin (NEURONTIN) 600 MG tablet Take 1 tablet (600 mg total) by mouth 3 (three) times daily as needed (pain). 60 tablet 1   glucose blood test strip Use as instructed 100 each 12   HYDROcodone-acetaminophen (NORCO/VICODIN) 5-325 MG tablet Take 1 tablet by mouth every 4 (four) hours as  needed for moderate pain. (Patient not taking: Reported on 12/02/2022) 20 tablet 0   hyoscyamine (LEVSIN SL) 0.125 MG SL tablet DISSOLVE ONE TABLET UNDER THE TONGUE 4 TIMES DAILY UP TO EVERY 4 HOURS AS NEEDED FOR NAUSEA, BLOATING, CRAMPING, OR DIARRHEA (Patient not taking: Reported on 12/02/2022) 100 tablet 0   insulin degludec (TRESIBA FLEXTOUCH) 100 UNIT/ML FlexTouch Pen INJECT 32 UNITS INTO THE SKIN DAILY. 15 mL 1   insulin lispro (HUMALOG KWIKPEN) 100 UNIT/ML KwikPen 10 TO 15 UNITS BEFORE MEALS AS DIRECTED (Patient taking differently: Inject 10-12 Units into the skin 3 (three) times daily before meals.) 15 mL 1   LORazepam (ATIVAN) 1 MG tablet Take 1 tablet (1 mg total) by mouth at bedtime as needed for sleep. 20 tablet 0   metoprolol succinate (TOPROL XL) 25 MG 24 hr tablet Take 0.5 tablets (12.5 mg total) by mouth daily. (Patient taking differently: Take 12.5 mg by mouth daily. Per patient, he will start taking Metoprolol on 12/02/22 and take again on the day of surgery (12/03/22.)) 45 tablet 3   oxyCODONE (ROXICODONE) 5 MG immediate release tablet Take 1 tablet (5 mg total) by mouth every 6 (six) hours as needed for up to 30 doses for severe pain. 30 tablet 0   potassium chloride (KLOR-CON M) 10 MEQ tablet Take 1 tablet (10 mEq total) by mouth 2 (two) times daily. 14 tablet 0   prochlorperazine (COMPAZINE) 10 MG tablet Take 1 tablet (10 mg total) by mouth every 6 (six) hours as needed (Nausea or vomiting). 60 tablet 1   rivaroxaban (XARELTO) 20 MG TABS tablet Take 1 tablet (20 mg total) by mouth daily with supper. (Patient taking differently: Take 20 mg by mouth daily with supper. Patient stated that he last took Xarelto on 11/30/22.) 90 tablet 3   traMADol (ULTRAM) 50 MG tablet Take 1-2 tablets (50-100 mg total) by mouth every 6 (six) hours as needed. 120 tablet 0   zolpidem (AMBIEN) 10 MG tablet Take 1 tablet (10 mg total) by mouth at bedtime as needed. for sleep 60 tablet 0   No current  facility-administered medications for this visit.    PHYSICAL EXAMINATION: ECOG PERFORMANCE STATUS: 1 - Symptomatic but completely ambulatory  Vitals:   04/25/23 0904  BP: 118/74  Pulse: 84  Resp: 18  Temp: 97.9 F (36.6 C)  SpO2: 98%   Wt Readings from Last 3 Encounters:  04/25/23 198 lb 9.6 oz (90.1 kg)  04/11/23 201 lb 3.2 oz (91.3 kg)  03/28/23 197 lb 8 oz (89.6 kg)     GENERAL:alert, no distress and comfortable SKIN: skin color, texture, turgor are normal, no rashes or significant lesions EYES: normal, Conjunctiva are pink and non-injected, sclera clear NECK: supple, thyroid normal size, non-tender, without nodularity LYMPH:  no palpable lymphadenopathy in the cervical, axillary  LUNGS: clear to auscultation and percussion with  normal breathing effort HEART: regular rate & rhythm and no murmurs and no lower extremity edema ABDOMEN:abdomen soft, non-tender and normal bowel sounds Musculoskeletal:no cyanosis of digits and no clubbing  NEURO: alert & oriented x 3 with fluent speech, no focal motor/sensory deficits  LABORATORY DATA:  I have reviewed the data as listed    Latest Ref Rng & Units 04/25/2023    8:47 AM 04/11/2023    8:13 AM 03/28/2023    8:08 AM  CBC  WBC 4.0 - 10.5 K/uL 5.5  7.8  5.6   Hemoglobin 13.0 - 17.0 g/dL 16.1  09.6  04.5   Hematocrit 39.0 - 52.0 % 34.7  34.6  34.8   Platelets 150 - 400 K/uL 104  104  98         Latest Ref Rng & Units 04/11/2023    8:13 AM 03/28/2023    8:08 AM 03/14/2023    8:14 AM  CMP  Glucose 70 - 99 mg/dL 409  811  914   BUN 8 - 23 mg/dL 17  15  16    Creatinine 0.61 - 1.24 mg/dL 7.82  9.56  2.13   Sodium 135 - 145 mmol/L 141  140  142   Potassium 3.5 - 5.1 mmol/L 3.8  3.9  3.4   Chloride 98 - 111 mmol/L 110  108  111   CO2 22 - 32 mmol/L 26  26  26    Calcium 8.9 - 10.3 mg/dL 8.7  8.6  8.8   Total Protein 6.5 - 8.1 g/dL 6.6  6.7  6.3   Total Bilirubin 0.3 - 1.2 mg/dL 0.4  0.5  0.5   Alkaline Phos 38 - 126 U/L 101  94   96   AST 15 - 41 U/L 18  21  21    ALT 0 - 44 U/L 17  21  23        RADIOGRAPHIC STUDIES: I have personally reviewed the radiological images as listed and agreed with the findings in the report. No results found.    No orders of the defined types were placed in this encounter.  All questions were answered. The patient knows to call the clinic with any problems, questions or concerns. No barriers to learning was detected. The total time spent in the appointment was 25 minutes.     Malachy Mood, MD 04/25/2023   Carolin Coy, CMA, am acting as scribe for Malachy Mood, MD.   I have reviewed the above documentation for accuracy and completeness, and I agree with the above.

## 2023-04-25 NOTE — Patient Instructions (Signed)
[facility]  Discharge Instructions: Thank you for choosing [facility] to provide your oncology and hematology care.   If you have a lab appointment with the Cancer Center, please go directly to the Cancer Center and check in at the registration area.   Wear comfortable clothing and clothing appropriate for easy access to any Portacath or PICC line.   We strive to give you quality time with your provider. You may need to reschedule your appointment if you arrive late (15 or more minutes).  Arriving late affects you and other patients whose appointments are after yours.  Also, if you miss three or more appointments without notifying the office, you may be dismissed from the clinic at the provider's discretion.      For prescription refill requests, have your pharmacy contact our office and allow 72 hours for refills to be completed.    Today you received the following chemotherapy and/or immunotherapy agents: Abraxane, Gemcitabine.       To help prevent nausea and vomiting after your treatment, we encourage you to take your nausea medication as directed.  BELOW ARE SYMPTOMS THAT SHOULD BE REPORTED IMMEDIATELY: *FEVER GREATER THAN 100.4 F (38 C) OR HIGHER *CHILLS OR SWEATING *NAUSEA AND VOMITING THAT IS NOT CONTROLLED WITH YOUR NAUSEA MEDICATION *UNUSUAL SHORTNESS OF BREATH *UNUSUAL BRUISING OR BLEEDING *URINARY PROBLEMS (pain or burning when urinating, or frequent urination) *BOWEL PROBLEMS (unusual diarrhea, constipation, pain near the anus) TENDERNESS IN MOUTH AND THROAT WITH OR WITHOUT PRESENCE OF ULCERS (sore throat, sores in mouth, or a toothache) UNUSUAL RASH, SWELLING OR PAIN  UNUSUAL VAGINAL DISCHARGE OR ITCHING   Items with * indicate a potential emergency and should be followed up as soon as possible or go to the Emergency Department if any problems should occur.  Please show the CHEMOTHERAPY ALERT CARD or IMMUNOTHERAPY ALERT  CARD at check-in to the Emergency Department and triage nurse.  Should you have questions after your visit or need to cancel or reschedule your appointment, please contact [facility]  Dept: [phone]  and follow the prompts.  Office hours are 8:00 a.m. to 4:30 p.m. Monday - Friday. Please note that voicemails left after 4:00 p.m. may not be returned until the following business day.  We are closed weekends and major holidays. You have access to a nurse at all times for urgent questions. Please call the main number to the clinic Dept: [phone] and follow the prompts.   For any non-urgent questions, you may also contact your provider using MyChart. We now offer e-Visits for anyone 18 and older to request care online for non-urgent symptoms. For details visit [email or web address].   Also download the MyChart app! Go to the app store, search "MyChart", open the app, select [facility], and log in with your MyChart username and password.   

## 2023-04-27 LAB — CANCER ANTIGEN 19-9: CA 19-9: 191 U/mL — ABNORMAL HIGH (ref 0–35)

## 2023-04-29 IMAGING — XA IR IMAGING GUIDED PORT INSERTION
1 series · 1 of 1 positions shown · non-contrast
Comparison: None.

INDICATION: 64-year-old male history of metastatic prostate cancer requiring
central venous access for chemotherapy administration

EXAM:
IMPLANTED PORT A CATH PLACEMENT WITH ULTRASOUND AND FLUOROSCOPIC
GUIDANCE

[Series 1: single · 1 of 1 slices shown]
[im 1/1]
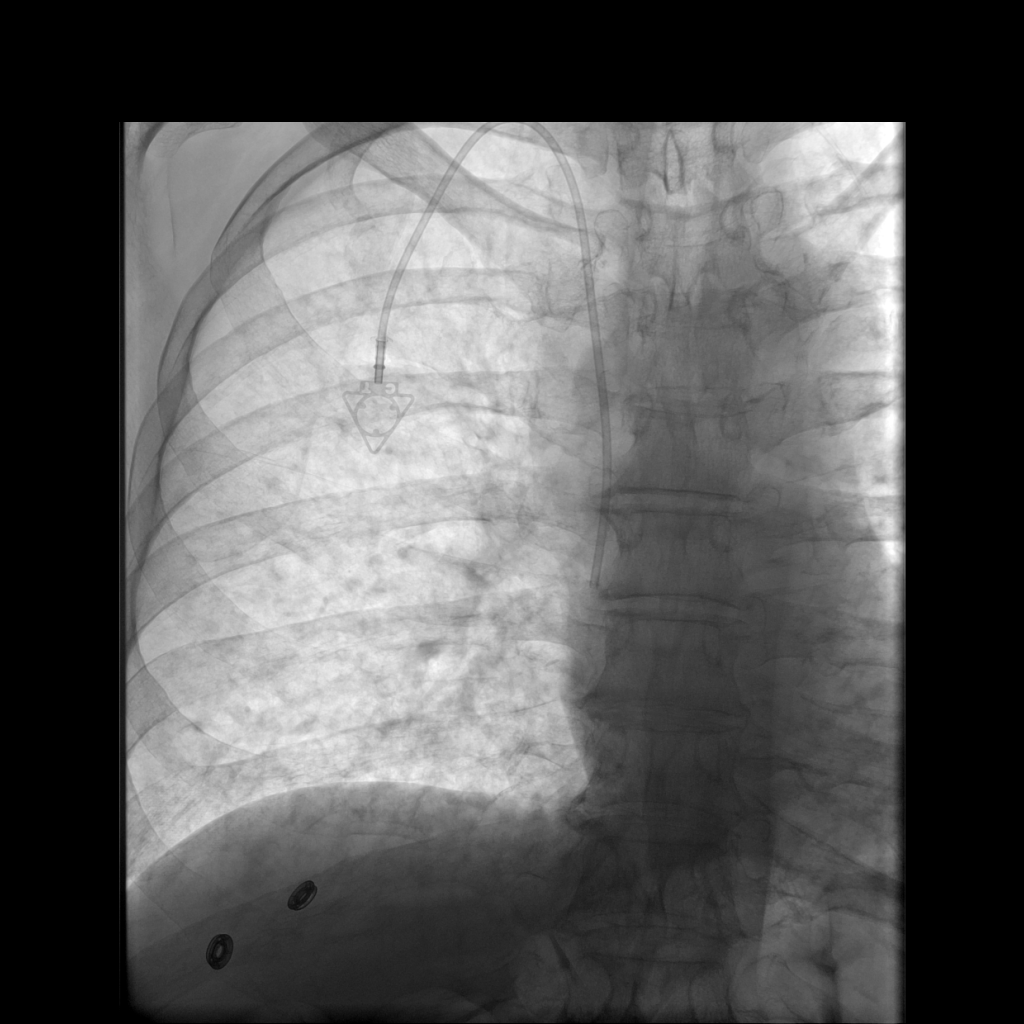

[1 of 1 positions shown; findings below may reference images not displayed]

MEDICATIONS:
None.

ANESTHESIA/SEDATION:
Moderate (conscious) sedation was employed during this procedure. A
total of Versed 2 mg and Fentanyl 100 mcg was administered
intravenously.

Moderate Sedation Time: 19 minutes. The patient's level of
consciousness and vital signs were monitored continuously by
radiology nursing throughout the procedure under my direct
supervision.

CONTRAST:  None

FLUOROSCOPY TIME:  0 minutes, 6 seconds (2 mGy)

COMPLICATIONS:
None immediate.

PROCEDURE:
The procedure, risks, benefits, and alternatives were explained to
the patient. Questions regarding the procedure were encouraged and
answered. The patient understands and consents to the procedure.

The right neck and chest were prepped with chlorhexidine in a
sterile fashion, and a sterile drape was applied covering the
operative field. Maximum barrier sterile technique with sterile
gowns and gloves were used for the procedure. A timeout was
performed prior to the initiation of the procedure.

Ultrasound was used to examine the jugular vein which was
compressible and free of internal echoes. A skin marker was used to
demarcate the planned venotomy and port pocket incision sites. Local
anesthesia was provided to these sites and the subcutaneous tunnel
track with 1% lidocaine with [DATE] epinephrine.

A small incision was created at the jugular access site and blunt
dissection was performed of the subcutaneous tissues. Under
ultrasound guidance, the jugular vein was accessed with a 21 ga
micropuncture needle and an 0.018" wire was inserted to the superior
vena cava. Real-time ultrasound guidance was utilized for vascular
access including the acquisition of a permanent ultrasound image
documenting patency of the accessed vessel. A 5 Fr micopuncture set
was then used, through which a 0.035" Rosen wire was passed under
fluoroscopic guidance into the inferior vena cava. An 8 Fr dilator
was then placed over the wire.

A subcutaneous port pocket was then created along the upper chest
wall utilizing a combination of sharp and blunt dissection. The
pocket was irrigated with sterile saline, packed with gauze, and
observed for hemorrhage. A single lumen "ISP" sized power injectable
port was chosen for placement. The 8 Fr catheter was tunneled from
the port pocket site to the venotomy incision. The port was placed
in the pocket. The external catheter was trimmed to appropriate
length. The dilator was exchanged for an 8 Fr peel-away sheath under
fluoroscopic guidance. The catheter was then placed through the
sheath and the sheath was removed. Final catheter positioning was
confirmed and documented with a fluoroscopic spot radiograph. The
port was accessed with Katelijne East needle, aspirated, and flushed with
heparinized saline.

The deep dermal layer of the port pocket incision was closed with
interrupted 3-0 Vicryl suture. The skin was opposed with a running
subcuticular 4-0 Monocryl suture. Dermabond was then placed over the
port pocket and neck incisions. The patient tolerated the procedure
well without immediate post procedural complication.
FINDINGS: After catheter placement, the tip lies within the superior
cavoatrial junction. The catheter aspirates and flushes normally and
is ready for immediate use.
IMPRESSION: Successful placement of a power injectable Port-A-Cath via the right
internal jugular vein. The catheter is ready for immediate use.

## 2023-05-02 ENCOUNTER — Other Ambulatory Visit: Payer: Self-pay

## 2023-05-02 ENCOUNTER — Telehealth: Payer: Self-pay

## 2023-05-02 NOTE — Telephone Encounter (Signed)
[facility] Pharmacist Lolita Cram PharmD contacted Dr. Latanya Maudlin office regarding the prescriptions written for pt regarding sleep.  (Alprazolam, Lorazepam, and Zolpidem)  Misty Stanley would like to speak with Dr. Mosetta Putt regarding the different type of benzodiazepines prescribed for sleep.  Misty Stanley requested if Dr. Mosetta Putt would please give her a call at [phone] to further discuss these prescriptions.  Misty Stanley stated she will not refill any of this pt's sleep medications until she speaks with Dr. Mosetta Putt.  Informed Misty Stanley that Dr. Mosetta Putt is currently in clinic seeing pts but will contact her once she's finished in clinic.  Misty Stanley verbalized understanding and will await Dr. Latanya Maudlin call.

## 2023-05-05 ENCOUNTER — Telehealth: Payer: Self-pay

## 2023-05-05 ENCOUNTER — Other Ambulatory Visit: Payer: Self-pay

## 2023-05-05 NOTE — Telephone Encounter (Signed)
Patient called wanting to get some assistance with the filling of a couple of his meds. It was his Ambien and tramadol. Patient stated that the pharmacy wouldn't fill them. I looked at notes from conversation with Dr. Mosetta Putt and the pharmacy. Dr.Feng stated that she would talk with patient at his next visit about the prescriptions. Patient voiced full understanding and said he would talk to her then.

## 2023-05-09 ENCOUNTER — Inpatient Hospital Stay: Payer: Medicare Other

## 2023-05-09 ENCOUNTER — Inpatient Hospital Stay (HOSPITAL_BASED_OUTPATIENT_CLINIC_OR_DEPARTMENT_OTHER): Payer: Medicare Other | Admitting: Hematology

## 2023-05-09 ENCOUNTER — Other Ambulatory Visit: Payer: Self-pay

## 2023-05-09 VITALS — BP 115/81 | HR 75 | Temp 97.7°F | Resp 18 | Ht 71.0 in | Wt 196.7 lb

## 2023-05-09 DIAGNOSIS — E860 Dehydration: Secondary | ICD-10-CM

## 2023-05-09 DIAGNOSIS — E1122 Type 2 diabetes mellitus with diabetic chronic kidney disease: Secondary | ICD-10-CM | POA: Diagnosis not present

## 2023-05-09 DIAGNOSIS — Z95828 Presence of other vascular implants and grafts: Secondary | ICD-10-CM

## 2023-05-09 DIAGNOSIS — Z7189 Other specified counseling: Secondary | ICD-10-CM

## 2023-05-09 DIAGNOSIS — Z5111 Encounter for antineoplastic chemotherapy: Secondary | ICD-10-CM | POA: Diagnosis not present

## 2023-05-09 DIAGNOSIS — Z794 Long term (current) use of insulin: Secondary | ICD-10-CM

## 2023-05-09 DIAGNOSIS — C259 Malignant neoplasm of pancreas, unspecified: Secondary | ICD-10-CM

## 2023-05-09 DIAGNOSIS — F419 Anxiety disorder, unspecified: Secondary | ICD-10-CM | POA: Diagnosis not present

## 2023-05-09 LAB — CBC WITH DIFFERENTIAL (CANCER CENTER ONLY)
Abs Immature Granulocytes: 0.03 10*3/uL (ref 0.00–0.07)
Basophils Absolute: 0 10*3/uL (ref 0.0–0.1)
Basophils Relative: 0 %
Eosinophils Absolute: 0.2 10*3/uL (ref 0.0–0.5)
Eosinophils Relative: 3 %
HCT: 38.8 % — ABNORMAL LOW (ref 39.0–52.0)
Hemoglobin: 13 g/dL (ref 13.0–17.0)
Immature Granulocytes: 0 %
Lymphocytes Relative: 23 %
Lymphs Abs: 1.7 10*3/uL (ref 0.7–4.0)
MCH: 34.2 pg — ABNORMAL HIGH (ref 26.0–34.0)
MCHC: 33.5 g/dL (ref 30.0–36.0)
MCV: 102.1 fL — ABNORMAL HIGH (ref 80.0–100.0)
Monocytes Absolute: 0.8 10*3/uL (ref 0.1–1.0)
Monocytes Relative: 11 %
Neutro Abs: 4.6 10*3/uL (ref 1.7–7.7)
Neutrophils Relative %: 63 %
Platelet Count: 124 10*3/uL — ABNORMAL LOW (ref 150–400)
RBC: 3.8 MIL/uL — ABNORMAL LOW (ref 4.22–5.81)
RDW: 14.2 % (ref 11.5–15.5)
WBC Count: 7.4 10*3/uL (ref 4.0–10.5)
nRBC: 0 % (ref 0.0–0.2)

## 2023-05-09 LAB — CMP (CANCER CENTER ONLY)
ALT: 19 U/L (ref 0–44)
AST: 19 U/L (ref 15–41)
Albumin: 3.6 g/dL (ref 3.5–5.0)
Alkaline Phosphatase: 83 U/L (ref 38–126)
Anion gap: 6 (ref 5–15)
BUN: 20 mg/dL (ref 8–23)
CO2: 26 mmol/L (ref 22–32)
Calcium: 9.1 mg/dL (ref 8.9–10.3)
Chloride: 108 mmol/L (ref 98–111)
Creatinine: 1.17 mg/dL (ref 0.61–1.24)
GFR, Estimated: >60 mL/min (ref >60)
Glucose, Bld: 183 mg/dL — ABNORMAL HIGH (ref 70–99)
Potassium: 3.8 mmol/L (ref 3.5–5.1)
Sodium: 140 mmol/L (ref 135–145)
Total Bilirubin: 0.4 mg/dL (ref 0.3–1.2)
Total Protein: 6.1 g/dL — ABNORMAL LOW (ref 6.5–8.1)

## 2023-05-09 MED ORDER — ZOLPIDEM TARTRATE 10 MG PO TABS
10.0000 mg | ORAL_TABLET | Freq: Every evening | ORAL | 2 refills | Status: DC | PRN
Start: 2023-05-09 — End: 2023-07-08

## 2023-05-09 MED ORDER — ALPRAZOLAM 0.5 MG PO TABS: 0.5000 mg | ORAL_TABLET | Freq: Three times a day (TID) | ORAL | 0 refills | Status: DC | PRN

## 2023-05-09 MED ORDER — SODIUM CHLORIDE 0.9% FLUSH
10.0000 mL | INTRAVENOUS | Status: DC | PRN
  Administered 2023-05-09: 10 mL

## 2023-05-09 MED ORDER — TRAMADOL HCL 50 MG PO TABS
50.0000 mg | ORAL_TABLET | Freq: Four times a day (QID) | ORAL | 0 refills | Status: DC | PRN
Start: 2023-05-09 — End: 2023-05-30

## 2023-05-09 MED ORDER — PROCHLORPERAZINE MALEATE 10 MG PO TABS
10.0000 mg | ORAL_TABLET | Freq: Four times a day (QID) | ORAL | 1 refills | Status: DC | PRN
Start: 2023-05-09 — End: 2023-09-03

## 2023-05-09 MED ORDER — MIRTAZAPINE 7.5 MG PO TABS: 7.5000 mg | ORAL_TABLET | Freq: Every day | ORAL | 0 refills | Status: DC

## 2023-05-09 MED ORDER — PROCHLORPERAZINE MALEATE 10 MG PO TABS
10.0000 mg | ORAL_TABLET | Freq: Once | ORAL | Status: AC
  Administered 2023-05-09: 10 mg via ORAL
  Filled 2023-05-09: qty 1

## 2023-05-09 MED ORDER — SODIUM CHLORIDE 0.9% FLUSH
10.0000 mL | Freq: Once | INTRAVENOUS | Status: AC
  Administered 2023-05-09: 10 mL

## 2023-05-09 MED ORDER — HEPARIN SOD (PORK) LOCK FLUSH 100 UNIT/ML IV SOLN
500.0000 [IU] | Freq: Once | INTRAVENOUS | Status: AC | PRN
  Administered 2023-05-09: 500 [IU]

## 2023-05-09 MED ORDER — PACLITAXEL PROTEIN-BOUND CHEMO INJECTION 100 MG
100.0000 mg/m2 | Freq: Once | INTRAVENOUS | Status: AC
  Administered 2023-05-09: 200 mg via INTRAVENOUS
  Filled 2023-05-09: qty 40

## 2023-05-09 MED ORDER — SODIUM CHLORIDE 0.9 % IV SOLN: Freq: Once | INTRAVENOUS | Status: AC

## 2023-05-09 MED ORDER — SODIUM CHLORIDE 0.9 % IV SOLN
800.0000 mg/m2 | Freq: Once | INTRAVENOUS | Status: AC
  Administered 2023-05-09: 1634 mg via INTRAVENOUS
  Filled 2023-05-09: qty 42.97

## 2023-05-09 NOTE — Assessment & Plan Note (Signed)
-  f/u with PCP -overall controlled

## 2023-05-09 NOTE — Progress Notes (Unsigned)
[facility]   [phone] [phone]   Clinic Follow up Note   Patient Care Team: Lucky Cowboy, MD as PCP - General (Internal Medicine) Pollyann Samples, NP as PCP - Hematology/Oncology (Nurse Practitioner) Thomasene Ripple, DO as PCP - Cardiology (Cardiology) Nadara Mustard, MD as Consulting Physician (Orthopedic Surgery) Laurey Morale, MD as Consulting Physician (Cardiology) Malachy Mood, MD as Consulting Physician (Oncology)  Date of Service:  05/09/2023  CHIEF COMPLAINT: f/u of  metastatic pancreatic cancer   CURRENT THERAPY:  Gemcitabine and Abraxane q28d   ASSESSMENT:  Zachary Reid is a 67 y.o. male with   Pancreatic adenocarcinoma (HCC) [phone] with numerous small cavitary lesions in bilateral lungs, MMR normal  -diagnosed 07/2021, after incidental finding on CT scan for kidney stone, by colonoscopy/EUS. -Baseline CA 19-9 elevated to 273 on 07/03/21 -He began first-line mFOLFIRINOX on 08/13/21, but required dose reduction and move to every 3 weeks. -his CA 19-9 trended up but is now stable. -restaging CT CAP 08/12/22 showed: multiple new and enlarging pulmonary metastases; pancreatic mass grossly stable. Will repeat in 3 months. -we changed his irinotecan to liposomal on 09/05/22. He tolerated better and feels stronger. -Due to disease progression, his treatment was changed to gemcitabine and Abraxane every 2 weeks on 01/02/2023 -He tolerated the first two cycles chemotherapy very well with mild fatigue.  I increased gemcitabine to 1000mg /m2 from cycle 3 -Restaging CT scan 03/12/2023 showed stable disease overall. His primary pancreatic tumor is slightly increased in size, however his multiple cavitary lung nodules are slightly improved with thinner walls now.   -will continue current therapy.  He is overall tolerating well, but he has developed worsening fatigue lately.  I will reduce gemcitabine dose to 800 mg/m again. -His tumor marker CA  19.9 continues trending down, which is a good clinical sign. -Plan to repeat restaging CT scan in late July, will order today   Goals of care, counseling/discussion -Patient understands his treatment is palliative, to prolong his life. -He has agreed with DNR.    Anxiety and insomnia -He is on Xanax and Ambien as needed -He still has a few nights of insomnia despite Ambien or Xanax after treatment, I previously prescribed lorazepam.  Due to the multiple benzos, I will discontinue lorazepam -He also has some signs of depression, I discussed the benefit of mirtazapine for insomnia and depression, potential benefit and side effects discussed with him, he is interested.  I will start him on 7.5 mg HS   Diabetes (HCC) -f/u with PCP -overall controlled      PLAN: -lab reviewed -CMP -stable -I prescribe Mirtazapine 7.5 mg -I refill Xanax ,Ambien, Tramadol, and Compazine -I recommend pt to restart his Xeralto -I recommend pt to monitor heart rate -proceed with Abraxane/Gemzar at reduce dose due to severe fatigue.  SUMMARY OF ONCOLOGIC HISTORY: Oncology History Overview Note   Cancer Staging  Pancreatic adenocarcinoma [facility]) Staging form: Exocrine Pancreas, AJCC 8th Edition - Clinical stage from 07/26/2021: Stage IV (cT4, cN0, cM1) - Signed by Malachy Mood, MD on 07/28/2021    Pancreatic adenocarcinoma (HCC)  07/02/2021 Initial Diagnosis   Pancreatic adenocarcinoma (HCC)   07/26/2021 Cancer Staging   Staging form: Exocrine Pancreas, AJCC 8th Edition - Clinical stage from 07/26/2021: Stage IV (cT4, cN0, cM1) - Signed by Malachy Mood, MD on 07/28/2021 Stage prefix: Initial diagnosis Total positive nodes: 0   08/13/2021 - 07/06/2022 Chemotherapy   Patient is on Treatment Plan : PANCREAS Modified FOLFIRINOX q14d x  4 cycles      Genetic Testing   [facility] CancerNext-Expanded results (77 genes) were negative. No pathogenic variants were identified. A variant of uncertain significance (VUS) was  identified in the CDKN1B gene. The report date is 08/15/2021.    The CancerNext-Expanded gene panel offered by [facility] and includes sequencing, rearrangement, and RNA analysis for the following 77 genes: AIP, ALK, APC, ATM, AXIN2, BAP1, BARD1, BLM, BMPR1A, BRCA1, BRCA2, BRIP1, CDC73, CDH1, CDK4, CDKN1B, CDKN2A, CHEK2, CTNNA1, DICER1, FANCC, FH, FLCN, GALNT12, KIF1B, LZTR1, MAX, MEN1, MET, MLH1, MSH2, MSH3, MSH6, MUTYH, NBN, NF1, NF2, NTHL1, PALB2, PHOX2B, PMS2, POT1, PRKAR1A, PTCH1, PTEN, RAD51C, RAD51D, RB1, RECQL, RET, SDHA, SDHAF2, SDHB, SDHC, SDHD, SMAD4, SMARCA4, SMARCB1, SMARCE1, STK11, SUFU, TMEM127, TP53, TSC1, TSC2, VHL and XRCC2 (sequencing and deletion/duplication); EGFR, EGLN1, HOXB13, KIT, MITF, PDGFRA, POLD1, and POLE (sequencing only); EPCAM and GREM1 (deletion/duplication only).     08/13/2021 - 12/06/2022 Chemotherapy   Patient is on Treatment Plan : PANCREAS Modified FOLFIRINOX q14d x 4 cycles     10/25/2021 Imaging   EXAM: CT CHEST, ABDOMEN, AND PELVIS WITH CONTRAST  IMPRESSION: 1. Innumerable bilateral pulmonary nodules, many of which are cavitary, consistent with metastatic disease. These nodules show no substantial change in are minimally progressed in the interval. 2. Mix cystic and solid lesion in the head and body of the pancreas is similar to prior and also comparing back to MRI 07/02/2021. 3. Hepatic cysts. 4. 8 mm nonobstructing left renal stone. 5. Aortic Atherosclerosis (ICD10-I70.0).   01/28/2022 Imaging   EXAM: CT CHEST, ABDOMEN, AND PELVIS WITH CONTRAST  IMPRESSION: 1. Innumerable bilateral small solid and cavitary pulmonary nodules, some of the cavitary nodules are slightly less thick-walled when compared with prior exam. 2. Mixed cystic and solid lesion in the head and body of the pancreas is similar to prior exam. 3. Nonobstructing left renal stone. 4.  Aortic Atherosclerosis (ICD10-I70.0).   11/29/2022 Imaging    IMPRESSION: 1. No significant  change in primary pancreatic mass and adjacent cystic component. Unchanged appearance of soft tissue extending to involve the adjacent celiac axis, superior mesenteric artery origin, and portal confluence. 2. 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 3. Innumerable small pulmonary nodules throughout the lungs, the majority of which are cavitary. Although interval change is difficult to appreciate for the majority of these nodules due to small size, at least some of these are slightly enlarged, consistent with worsened pulmonary metastatic disease. 4. Newly enlarged left retroperitoneal lymph nodes, which may reflect nodal metastatic disease or perhaps reactive to left hydronephrosis. Attention on follow-up. 5. A sizable calculus previously seen in the left renal pelvis has migrated to the middle third of the left ureter, with placement of a double-J left ureteral stent, with formed pigtails in the left renal pelvis and urinary bladder. Moderate left hydronephrosis and proximal hydroureter. 6. Coronary artery disease.   Aortic Atherosclerosis (ICD10-I70.0).   01/02/2023 -  Chemotherapy   Patient is on Treatment Plan : PANCREATIC Abraxane D1,8,15 + Gemcitabine D1,8,15 q28d     03/12/2023 Imaging    IMPRESSION: 1. Primary pancreatic mass with adjacent cystic component is slightly increased in size when compared with the prior exam. Similar soft tissue extending involve the celiac axis superior mesenteric artery origin and portal confluence. 2. Innumerable bilateral pulmonary nodules, some of which are cavitary, unchanged when compared with the prior. 3. Left retroperitoneal lymph nodes are decreased in size, likely resolving reactive adenopathy 4.  Interval removal of left ureter stent.  No hydronephrosis. 5. coronary artery disease and aortic Atherosclerosis (ICD10-I70.0).      INTERVAL HISTORY:  Zachary Reid is here for a follow up of metastatic pancreatic cancer. He was last seen by me on 04/25/2023. He presents to the clinic accompanied by wife. Pt state that his fatigue is getting worse.Pt state that after the second week from having chemo he has no energy. Pt denies having fever ,chill, nausea . Pt reports of a little cough, but nothing concerning. Pt state that he feels depress and not have interest in doing things.    All other systems were reviewed with the patient and are negative.  MEDICAL HISTORY:  Past Medical History:  Diagnosis Date   Adenocarcinoma of pancreas, stage 4 (HCC) 07/2021   oncologist--- dr Mosetta Putt;   mets to  bilateral lung;  started chemo 08-13-2021   Anemia    GAD (generalized anxiety disorder)    History of adenomatous polyp of colon    Hyperlipidemia    Hypogonadism male    IBS (irritable bowel syndrome)    Insomnia    Left ureteral calculus    Metastatic cancer to lung (HCC) 07/2021   primary pancreatic cancer w/ numerous small cavity lesions bilateral lungs   PAF (paroxysmal atrial fibrillation) (HCC) 10/31/2022   cardiologist-- dr Kirtland Bouchard. tobb;  newly dx ED 10-31-2022 w/ RVR  in setting flank pain (kidney stone)  --  office note in epic 11-07-2022 normal ETT 11-13-2022, echo 11-07-2022 ef 65-70% w/ mild LVH, mild MR,  zio monitor not completed,  started on toprol and xarelto daily   Personal history of chemotherapy    Receiving chemotherapy for metastatic pancreatic cancer. Most recent infusion (as of 12/02/22) was on 11/15/22.   Port-A-Cath in place 08/10/2021   Type 2 diabetes mellitus treated with insulin [facility])    endocrinologist--- dr Lucianne Muss   Vitamin D deficiency    takes Vit D supplements   Wears contact lenses     SURGICAL HISTORY: Past Surgical History:  Procedure Laterality Date   APPENDECTOMY  1988   BIOPSY  07/26/2021   Procedure: BIOPSY;  Surgeon: Lemar Lofty., MD;  Location: [facility] ENDOSCOPY;  Service: Gastroenterology;;    COLONOSCOPY WITH PROPOFOL N/A 07/26/2021   Procedure: COLONOSCOPY WITH PROPOFOL;  Surgeon: Lemar Lofty., MD;  Location: [facility] ENDOSCOPY;  Service: Gastroenterology;  Laterality: N/A;   CYSTOSCOPY WITH STENT PLACEMENT Left 10/31/2022   Procedure: CYSTOSCOPY WITH STENT PLACEMENT;  Surgeon: Belva Agee, MD;  Location: [facility] ORS;  Service: Urology;  Laterality: Left;   CYSTOSCOPY/URETEROSCOPY/HOLMIUM LASER/STENT PLACEMENT Left 12/03/2022   Procedure: CYSTOSCOPY/LEFT URETEROSCOPY/HOLMIUM LASER/STENT EXCHANGE;  Surgeon: Belva Agee, MD;  Location[facility];  Service: Urology;  Laterality: Left;  1 HR FOR CASE   ESOPHAGOGASTRODUODENOSCOPY (EGD) WITH PROPOFOL N/A 07/26/2021   Procedure: ESOPHAGOGASTRODUODENOSCOPY (EGD) WITH PROPOFOL;  Surgeon: Meridee Score Netty Starring., MD;  Location: [facility] ENDOSCOPY;  Service: Gastroenterology;  Laterality: N/A;   EUS N/A 07/26/2021   Procedure: UPPER ENDOSCOPIC ULTRASOUND (EUS) RADIAL;  Surgeon: Lemar Lofty., MD;  Location: [facility] ENDOSCOPY;  Service: Gastroenterology;  Laterality: N/A;   FINE NEEDLE ASPIRATION  07/26/2021   Procedure: FINE NEEDLE ASPIRATION (FNA) LINEAR;  Surgeon: Lemar Lofty., MD;  Location: [facility] ENDOSCOPY;  Service: Gastroenterology;;   IR IMAGING GUIDED PORT INSERTION  08/10/2021   POLYPECTOMY  07/26/2021   Procedure: POLYPECTOMY;  Surgeon: Lemar Lofty., MD;  Location: [facility] ENDOSCOPY;  Service: Gastroenterology;;  SHOULDER SURGERY Right    as a teenager    I have reviewed the social history and family history with the patient and they are unchanged from previous note.  ALLERGIES:  is allergic to lipitor [atorvastatin].  MEDICATIONS:  Current Outpatient Medications  Medication Sig Dispense Refill   ALPRAZolam (XANAX) 0.5 MG tablet Take 1 tablet (0.5 mg total) by mouth 3 (three) times daily as needed for anxiety. 60 tablet 0   Cholecalciferol (VITAMIN D3) 125 MCG (5000 UT) CAPS Take 5,000  Units by mouth in the morning and at bedtime.     Continuous Blood Gluc Sensor (DEXCOM G7 SENSOR) MISC 1 Device by Does not apply route as directed. Change sensor every 10 days (Patient taking differently: 1 Device by Does not apply route as directed. Change sensor every 10 days Per patient on 12/02/22, he has not yet received glucose monitor.) 3 each 3   gabapentin (NEURONTIN) 600 MG tablet Take 1 tablet (600 mg total) by mouth 3 (three) times daily as needed (pain). 60 tablet 1   glucose blood test strip Use as instructed 100 each 12   HYDROcodone-acetaminophen (NORCO/VICODIN) 5-325 MG tablet Take 1 tablet by mouth every 4 (four) hours as needed for moderate pain. (Patient not taking: Reported on 12/02/2022) 20 tablet 0   hyoscyamine (LEVSIN SL) 0.125 MG SL tablet DISSOLVE ONE TABLET UNDER THE TONGUE 4 TIMES DAILY UP TO EVERY 4 HOURS AS NEEDED FOR NAUSEA, BLOATING, CRAMPING, OR DIARRHEA (Patient not taking: Reported on 12/02/2022) 100 tablet 0   insulin degludec (TRESIBA FLEXTOUCH) 100 UNIT/ML FlexTouch Pen INJECT 32 UNITS INTO THE SKIN DAILY. 15 mL 1   insulin lispro (HUMALOG KWIKPEN) 100 UNIT/ML KwikPen 10 TO 15 UNITS BEFORE MEALS AS DIRECTED (Patient taking differently: Inject 10-12 Units into the skin 3 (three) times daily before meals.) 15 mL 1   LORazepam (ATIVAN) 1 MG tablet Take 1 tablet (1 mg total) by mouth at bedtime as needed for sleep. 20 tablet 0   metoprolol succinate (TOPROL XL) 25 MG 24 hr tablet Take 0.5 tablets (12.5 mg total) by mouth daily. (Patient taking differently: Take 12.5 mg by mouth daily. Per patient, he will start taking Metoprolol on 12/02/22 and take again on the day of surgery (12/03/22.)) 45 tablet 3   oxyCODONE (ROXICODONE) 5 MG immediate release tablet Take 1 tablet (5 mg total) by mouth every 6 (six) hours as needed for up to 30 doses for severe pain. 30 tablet 0   potassium chloride (KLOR-CON M) 10 MEQ tablet Take 1 tablet (10 mEq total) by mouth 2 (two) times daily.  14 tablet 0   prochlorperazine (COMPAZINE) 10 MG tablet Take 1 tablet (10 mg total) by mouth every 6 (six) hours as needed (Nausea or vomiting). 60 tablet 1   rivaroxaban (XARELTO) 20 MG TABS tablet Take 1 tablet (20 mg total) by mouth daily with supper. (Patient taking differently: Take 20 mg by mouth daily with supper. Patient stated that he last took Xarelto on 11/30/22.) 90 tablet 3   traMADol (ULTRAM) 50 MG tablet Take 1-2 tablets (50-100 mg total) by mouth every 6 (six) hours as needed. 120 tablet 0   zolpidem (AMBIEN) 10 MG tablet Take 1 tablet (10 mg total) by mouth at bedtime as needed. for sleep 60 tablet 0   No current facility-administered medications for this visit.    PHYSICAL EXAMINATION: ECOG PERFORMANCE STATUS: 2 - Symptomatic, <50% confined to bed  Vitals:   05/09/23 0925  BP: 115/81  Pulse: 75  Resp: 18  Temp: 97.7 F (36.5 C)  SpO2: 99%   Wt Readings from Last 3 Encounters:  05/09/23 196 lb 11.2 oz (89.2 kg)  04/25/23 198 lb 9.6 oz (90.1 kg)  04/11/23 201 lb 3.2 oz (91.3 kg)     GENERAL:alert, no distress and comfortable SKIN: skin color normal, no rashes or significant lesions EYES: normal, Conjunctiva are pink and non-injected, sclera clear  NEURO: alert & oriented x 3 with fluent speech HEART: (+)regular rate & rhythm and no murmurs and no lower extremity edema LABORATORY DATA:  I have reviewed the data as listed    Latest Ref Rng & Units 05/09/2023    8:36 AM 04/25/2023    8:47 AM 04/11/2023    8:13 AM  CBC  WBC 4.0 - 10.5 K/uL 7.4  5.5  7.8   Hemoglobin 13.0 - 17.0 g/dL 09.8  11.9  14.7   Hematocrit 39.0 - 52.0 % 38.8  34.7  34.6   Platelets 150 - 400 K/uL 124  104  104         Latest Ref Rng & Units 05/09/2023    8:36 AM 04/25/2023    8:47 AM 04/11/2023    8:13 AM  CMP  Glucose 70 - 99 mg/dL 829  562  130   BUN 8 - 23 mg/dL 20  17  17    Creatinine 0.61 - 1.24 mg/dL 8.65  7.84  6.96   Sodium 135 - 145 mmol/L 140  139  141   Potassium 3.5 - 5.1  mmol/L 3.8  3.9  3.8   Chloride 98 - 111 mmol/L 108  106  110   CO2 22 - 32 mmol/L 26  27  26    Calcium 8.9 - 10.3 mg/dL 9.1  9.1  8.7   Total Protein 6.5 - 8.1 g/dL 6.1  6.8  6.6   Total Bilirubin 0.3 - 1.2 mg/dL 0.4  0.5  0.4   Alkaline Phos 38 - 126 U/L 83  89  101   AST 15 - 41 U/L 19  21  18    ALT 0 - 44 U/L 19  20  17        RADIOGRAPHIC STUDIES: I have personally reviewed the radiological images as listed and agreed with the findings in the report. No results found.    No orders of the defined types were placed in this encounter.  All questions were answered. The patient knows to call the clinic with any problems, questions or concerns. No barriers to learning was detected. The total time spent in the appointment was 25 minutes.     Malachy Mood, MD 05/09/2023   Carolin Coy, CMA, am acting as scribe for Malachy Mood, MD.   I have reviewed the above documentation for accuracy and completeness, and I agree with the above.

## 2023-05-09 NOTE — Assessment & Plan Note (Signed)
[id number] with numerous small cavitary lesions in bilateral lungs, MMR normal  -diagnosed 07/2021, after incidental finding on CT scan for kidney stone, by colonoscopy/EUS. -Baseline CA 19-9 elevated to 273 on 07/03/21 -He began first-line mFOLFIRINOX on 08/13/21, but required dose reduction and move to every 3 weeks. -his CA 19-9 trended up but is now stable. -restaging CT CAP 08/12/22 showed: multiple new and enlarging pulmonary metastases; pancreatic mass grossly stable. Will repeat in 3 months. -we changed his irinotecan to liposomal on 09/05/22. He tolerated better and feels stronger. -Due to disease progression, his treatment was changed to gemcitabine and Abraxane every 2 weeks on 01/02/2023 -He tolerated the first two cycles chemotherapy very well with mild fatigue.  I increased gemcitabine to 1000mg /m2 from cycle 3 -Restaging CT scan 03/12/2023 showed stable disease overall. His primary pancreatic tumor is slightly increased in size, however his multiple cavitary lung nodules are slightly improved with thinner walls now.   -will continue current therapy.  He is overall tolerating well -His tumor marker CA 19.9 continues trending down, which is a good clinical sign. -Plan to repeat restaging CT scan in late July, will order today

## 2023-05-09 NOTE — Assessment & Plan Note (Signed)
-  Patient understands his treatment is palliative, to prolong his life. -He has agreed with DNR. 

## 2023-05-09 NOTE — Assessment & Plan Note (Signed)
-  He is on Xanax and Ambien as needed -He still has a few nights of insomnia despite Ambien or Xanax after treatment, he will take Ativan as needed. 

## 2023-05-09 NOTE — Patient Instructions (Signed)
[facility]  Discharge Instructions: Thank you for choosing [facility] to provide your oncology and hematology care.   If you have a lab appointment with the Cancer Center, please go directly to the Cancer Center and check in at the registration area.   Wear comfortable clothing and clothing appropriate for easy access to any Portacath or PICC line.   We strive to give you quality time with your provider. You may need to reschedule your appointment if you arrive late (15 or more minutes).  Arriving late affects you and other patients whose appointments are after yours.  Also, if you miss three or more appointments without notifying the office, you may be dismissed from the clinic at the provider's discretion.      For prescription refill requests, have your pharmacy contact our office and allow 72 hours for refills to be completed.    Today you received the following chemotherapy and/or immunotherapy agents: Abraxane, Gemcitabine.       To help prevent nausea and vomiting after your treatment, we encourage you to take your nausea medication as directed.  BELOW ARE SYMPTOMS THAT SHOULD BE REPORTED IMMEDIATELY: *FEVER GREATER THAN 100.4 F (38 C) OR HIGHER *CHILLS OR SWEATING *NAUSEA AND VOMITING THAT IS NOT CONTROLLED WITH YOUR NAUSEA MEDICATION *UNUSUAL SHORTNESS OF BREATH *UNUSUAL BRUISING OR BLEEDING *URINARY PROBLEMS (pain or burning when urinating, or frequent urination) *BOWEL PROBLEMS (unusual diarrhea, constipation, pain near the anus) TENDERNESS IN MOUTH AND THROAT WITH OR WITHOUT PRESENCE OF ULCERS (sore throat, sores in mouth, or a toothache) UNUSUAL RASH, SWELLING OR PAIN  UNUSUAL VAGINAL DISCHARGE OR ITCHING   Items with * indicate a potential emergency and should be followed up as soon as possible or go to the Emergency Department if any problems should occur.  Please show the CHEMOTHERAPY ALERT CARD or IMMUNOTHERAPY ALERT  CARD at check-in to the Emergency Department and triage nurse.  Should you have questions after your visit or need to cancel or reschedule your appointment, please contact [facility]  Dept: [phone]  and follow the prompts.  Office hours are 8:00 a.m. to 4:30 p.m. Monday - Friday. Please note that voicemails left after 4:00 p.m. may not be returned until the following business day.  We are closed weekends and major holidays. You have access to a nurse at all times for urgent questions. Please call the main number to the clinic Dept: [phone] and follow the prompts.   For any non-urgent questions, you may also contact your provider using MyChart. We now offer e-Visits for anyone 18 and older to request care online for non-urgent symptoms. For details visit [email or web address].   Also download the MyChart app! Go to the app store, search "MyChart", open the app, select [facility], and log in with your MyChart username and password.   

## 2023-05-12 ENCOUNTER — Encounter: Payer: Self-pay | Admitting: Hematology

## 2023-05-17 ENCOUNTER — Other Ambulatory Visit: Payer: Self-pay

## 2023-05-18 ENCOUNTER — Ambulatory Visit
Admission: RE | Admit: 2023-05-18 | Discharge: 2023-05-18 | Disposition: A | Discharge status: Home / Self Care | Payer: Medicare Other | Source: Ambulatory Visit | Attending: Nurse Practitioner | Admitting: Nurse Practitioner

## 2023-05-18 VITALS — BP 133/74 | HR 87 | Temp 98.3°F | Resp 18

## 2023-05-18 DIAGNOSIS — T25222A Burn of second degree of left foot, initial encounter: Secondary | ICD-10-CM | POA: Diagnosis not present

## 2023-05-18 DIAGNOSIS — S90822A Blister (nonthermal), left foot, initial encounter: Secondary | ICD-10-CM | POA: Diagnosis not present

## 2023-05-18 MED ORDER — SILVER SULFADIAZINE 1 % EX CREA: TOPICAL_CREAM | Freq: Once | CUTANEOUS | Status: AC

## 2023-05-18 MED ORDER — SILVER SULFADIAZINE 1 % EX CREA
1.0000 | TOPICAL_CREAM | Freq: Every day | CUTANEOUS | 0 refills | Status: DC
Start: 2023-05-18 — End: 2024-03-16

## 2023-05-18 NOTE — Discharge Instructions (Addendum)
Keep the wound clean and dry.  Apply Silvadene daily for 7 days.  Please closely monitor the area for any signs of infection which include but are not limited to redness, drainage, swelling, warmth, fevers and please seek reevaluation if these occur.  Please follow-up with your PCP in 2 to 3 days for recheck.  Please go to the ER for any worsening symptoms.  I hope you feel better soon!

## 2023-05-18 NOTE — ED Triage Notes (Signed)
Pt reports blister in bottom left foot since this morning. Pt think the blister was to exposure to hot pavement as he was chasing he daughters do yesterday. Pt is concern as he is diabetic, has neuropathy and is having chemotherapy for pancreatic cancer.

## 2023-05-18 NOTE — ED Provider Notes (Signed)
[facility]    CSN: [id number] Arrival date & time: 05/18/23  1231      History   Chief Complaint Chief Complaint  Patient presents with   Foot Injury    Diabetic with large blister on bottom of left foot - Entered by patient    HPI Zachary Reid is a 67 y.o. male presents for a blister to his foot.  Patient states yesterday he was chasing his daughter's dog barefoot over hot concrete and burned the bottom of his left foot.  He does have neuropathy and did not realize how hot the concrete was.  He did develop a blister to the bottom of the foot that popped on its own sometime last night or this morning.  He is concerned for infection as he is diabetic and he is also undergoing chemotherapy for pancreatic cancer.  He is up-to-date on his tetanus vaccine.  No other concerns at this time.   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 tobb;  newly dx ED 10-31-2022 w/ RVR  in setting flank pain (kidney stone)  --  office note in epic 11-07-2022 normal ETT 11-13-2022, echo 11-07-2022 ef 65-70% w/ mild LVH, mild MR,  zio monitor not completed,  started on toprol and xarelto daily   Personal history of chemotherapy    Receiving chemotherapy for metastatic pancreatic cancer. Most recent infusion (as of 12/02/22) was on 11/15/22.   Port-A-Cath in place 08/10/2021   Type 2 diabetes mellitus treated with insulin [facility])    endocrinologist--- dr Lucianne Muss    Vitamin D deficiency    takes Vit D supplements   Wears contact lenses     Patient Active Problem List   Diagnosis Date Noted   Goals of care, counseling/discussion 02/27/2023   Anxiety 02/27/2023   Dehydration 01/04/2022   Port-A-Cath in place 09/10/2021   Genetic testing 08/14/2021   Family history of pancreatic cancer 07/30/2021   Family history of ovarian cancer 07/30/2021   Family history of prostate cancer 07/30/2021   Aortic atherosclerosis (HCC) by PET scan on 07/19/2021 07/22/2021   Pulmonary nodules 07/03/2021   Pancreatic adenocarcinoma (HCC) 07/02/2021   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   Labile hypertension 10/13/2018   FH: hypertension 03/19/2018   CKD stage 2 due to type 2 diabetes mellitus (HCC) 12/17/2017   Diabetes (HCC) 02/02/2016   Vitamin D deficiency 05/24/2014   Testosterone Deficiency    Overweight (BMI 25.0-29.9)    Hyperlipidemia associated with type 2 diabetes mellitus (HCC) 05/29/2011    Past Surgical History:  Procedure Laterality Date   APPENDECTOMY  1988   BIOPSY  07/26/2021   Procedure: BIOPSY;  Surgeon: Lemar Lofty., MD;  Location: [facility] ENDOSCOPY;  Service: Gastroenterology;;   COLONOSCOPY WITH PROPOFOL N/A 07/26/2021   Procedure: COLONOSCOPY WITH PROPOFOL;  Surgeon: Lemar Lofty., MD;  Location: [facility] ENDOSCOPY;  Service: Gastroenterology;  Laterality: N/A;   CYSTOSCOPY WITH STENT PLACEMENT Left 10/31/2022   Procedure: CYSTOSCOPY WITH STENT PLACEMENT;  Surgeon: Belva Agee, MD;  Location: [facility] ORS;  Service: Urology;  Laterality: Left;   CYSTOSCOPY/URETEROSCOPY/HOLMIUM LASER/STENT PLACEMENT Left 12/03/2022   Procedure: CYSTOSCOPY/LEFT URETEROSCOPY/HOLMIUM LASER/STENT EXCHANGE;  Surgeon: Belva Agee, MD;  Location: [facility];  Service: Urology;  Laterality: Left;  1 HR FOR CASE   ESOPHAGOGASTRODUODENOSCOPY (EGD) WITH PROPOFOL N/A 07/26/2021   Procedure:  ESOPHAGOGASTRODUODENOSCOPY (EGD) WITH PROPOFOL;  Surgeon: Meridee Score Netty Starring., MD;  Location: [facility] ENDOSCOPY;  Service: Gastroenterology;  Laterality: N/A;   EUS N/A 07/26/2021   Procedure: UPPER ENDOSCOPIC ULTRASOUND (EUS) RADIAL;  Surgeon: Lemar Lofty., MD;  Location: [facility] ENDOSCOPY;  Service: Gastroenterology;  Laterality: N/A;   FINE NEEDLE ASPIRATION  07/26/2021   Procedure: FINE NEEDLE ASPIRATION (FNA) LINEAR;  Surgeon: Lemar Lofty., MD;  Location: [facility] ENDOSCOPY;  Service: Gastroenterology;;   IR IMAGING GUIDED PORT INSERTION  08/10/2021   POLYPECTOMY  07/26/2021   Procedure: POLYPECTOMY;  Surgeon: Lemar Lofty., MD;  Location: [facility] ENDOSCOPY;  Service: Gastroenterology;;   SHOULDER SURGERY Right    as a teenager       Home Medications    Prior to Admission medications   Medication Sig Start Date End Date Taking? Authorizing Provider  ALPRAZolam Prudy Feeler) 0.5 MG tablet Take 1 tablet (0.5 mg total) by mouth 3 (three) times daily as needed for anxiety. 05/09/23   Malachy Mood, MD  Cholecalciferol (VITAMIN D3) 125 MCG (5000 UT) CAPS Take 5,000 Units by mouth in the morning and at bedtime.    [provider]  Continuous Blood Gluc Sensor (DEXCOM G7 SENSOR) MISC 1 Device by Does not apply route as directed. Change sensor every 10 days Patient taking differently: 1 Device by Does not apply route as directed. Change sensor every 10 days Per patient on 12/02/22, he has not yet received glucose monitor. 11/19/22   Reather Littler, MD  gabapentin (NEURONTIN) 600 MG tablet Take 1 tablet (600 mg total) by mouth 3 (three) times daily as needed (pain). 01/02/23   Malachy Mood, MD  glucose blood test strip Use as instructed 12/19/17   Judd Gaudier, NP  hyoscyamine (LEVSIN SL) 0.125 MG SL tablet DISSOLVE ONE TABLET UNDER THE TONGUE 4 TIMES DAILY UP TO EVERY 4 HOURS AS NEEDED FOR NAUSEA, BLOATING, CRAMPING, OR DIARRHEA Patient not taking: Reported on 12/02/2022 11/07/21    Raynelle Dick, NP  insulin degludec (TRESIBA FLEXTOUCH) 100 UNIT/ML FlexTouch Pen INJECT 32 UNITS INTO THE SKIN DAILY. 03/17/23   Reather Littler, MD  insulin lispro (HUMALOG KWIKPEN) 100 UNIT/ML KwikPen 10 TO 15 UNITS BEFORE MEALS AS DIRECTED Patient taking differently: Inject 10-12 Units into the skin 3 (three) times daily before meals. 09/04/22   Reather Littler, MD  metoprolol succinate (TOPROL XL) 25 MG 24 hr tablet Take 0.5 tablets (12.5 mg total) by mouth daily. Patient taking differently: Take 12.5 mg by mouth daily. Per patient, he will start taking Metoprolol on 12/02/22 and take again on the day of surgery (12/03/22.) 11/07/22   Tobb, Kardie, DO  mirtazapine (REMERON) 7.5 MG tablet Take 1 tablet (7.5 mg total) by mouth at bedtime. 05/09/23   Malachy Mood, MD  potassium chloride (KLOR-CON M) 10 MEQ tablet Take 1 tablet (10 mEq total) by mouth 2 (two) times daily. 03/14/23   Malachy Mood, MD  prochlorperazine (COMPAZINE) 10 MG tablet Take 1 tablet (10 mg total) by mouth every  6 (six) hours as needed (Nausea or vomiting). 05/09/23   Malachy Mood, MD  rivaroxaban (XARELTO) 20 MG TABS tablet Take 1 tablet (20 mg total) by mouth daily with supper. Patient taking differently: Take 20 mg by mouth daily with supper. Patient stated that he last took Xarelto on 11/30/22. 11/07/22   Tobb, Kardie, DO  traMADol (ULTRAM) 50 MG tablet Take 1-2 tablets (50-100 mg total) by mouth every 6 (six) hours as needed. 05/09/23   Malachy Mood, MD  zolpidem (AMBIEN) 10 MG tablet Take 1 tablet (10 mg total) by mouth at bedtime as needed. for sleep 05/09/23   Malachy Mood, MD    Family History Family History  Problem Relation Age of Onset   Pancreatic cancer Mother 32   Prostate cancer Father 22       Prostate   Diabetes Father    Hypertension Father    Drug abuse Brother    Kidney Stones Brother    Ovarian cancer Paternal Grandmother 37   Lung cancer Paternal Grandfather        Cigar smoker   Colon cancer Neg Hx    Colon polyps Neg  Hx    Esophageal cancer Neg Hx    Stomach cancer Neg Hx    Rectal cancer Neg Hx     Social History Social History   Tobacco Use   Smoking status: Never   Smokeless tobacco: Never  Vaping Use   Vaping Use: Never used  Substance Use Topics   Alcohol use: No   Drug use: No     Allergies   Lipitor [atorvastatin]   Review of Systems Review of Systems  Skin:  Positive for wound.     Physical Exam Triage Vital Signs ED Triage Vitals  Enc Vitals Group     BP 05/18/23 1236 133/74     Pulse Rate 05/18/23 1236 87     Resp 05/18/23 1236 18     Temp 05/18/23 1236 98.3 F (36.8 C)     Temp Source 05/18/23 1236 Oral     SpO2 05/18/23 1236 96 %     Weight --      Height --      Head Circumference --      Peak Flow --      Pain Score 05/18/23 1240 3     Pain Loc --      Pain Edu? --      Excl. in GC? --    No data found.  Updated Vital Signs BP 133/74 (BP Location: Right Arm)   Pulse 87   Temp 98.3 F (36.8 C) (Oral)   Resp 18   SpO2 96%   Visual Acuity Right Eye Distance:   Left Eye Distance:   Bilateral Distance:    Right Eye Near:   Left Eye Near:    Bilateral Near:     Physical Exam Vitals and nursing note reviewed.  Constitutional:      General: He is not in acute distress.    Appearance: Normal appearance. He is not ill-appearing.  HENT:     Head: Normocephalic and atraumatic.  Eyes:     Pupils: Pupils are equal, round, and reactive to light.  Cardiovascular:     Rate and Rhythm: Normal rate.  Pulmonary:     Effort: Pulmonary effort is normal.  Musculoskeletal:       Feet:  Feet:     Comments: 3 to 4 cm blister to the sole of the left foot.  There is no fluctuance or fluid.  Mild erythema without warmth.  No drainage. Skin:    General: Skin is warm and dry.  Neurological:     General: No focal deficit present.     Mental Status: He is alert and oriented to person, place, and time.  Psychiatric:        Mood and Affect: Mood normal.         Behavior: Behavior normal.      UC Treatments / Results  Labs (all labs ordered are listed, but only abnormal results are displayed) Labs Reviewed - No data to display  EKG   Radiology No results found.  Procedures Procedures (including critical care time)  Medications Ordered in UC Medications  silver sulfADIAZINE (SILVADENE) 1 % cream (has no administration in time range)    Initial Impression / Assessment and Plan / UC Course  I have reviewed the triage vital signs and the nursing notes.  Pertinent labs & imaging results that were available during my care of the patient were reviewed by me and considered in my medical decision making (see chart for details).     Reviewed exam and symptoms with patient.  No red flags.   Area was cleansed and dressed by nursing staff with Silvadene.  Rx Silvadene sent to pharmacy.  Wound care reviewed.  Patient to follow-up with PCP in 2 to 3 days for recheck.  Strict ER precautions reviewed and patient verbalized understanding. Final Clinical Impressions(s) / UC Diagnoses   Final diagnoses:  Blister of left foot, initial encounter     Discharge Instructions      Keep the wound clean and dry.  Apply Silvadene daily for 7 days.  Please closely monitor the area for any signs of infection which include but are not limited to redness, drainage, swelling, warmth, fevers and please seek reevaluation if these occur.  Please follow-up with your PCP in 2 to 3 days for recheck.  Please go to the ER for any worsening symptoms.  I hope you feel better soon!   ED Prescriptions   None    PDMP not reviewed this encounter.   Radford Pax, NP 05/18/23 1253

## 2023-05-20 ENCOUNTER — Other Ambulatory Visit: Payer: Self-pay

## 2023-05-23 ENCOUNTER — Ambulatory Visit: Payer: Medicare Other | Admitting: Hematology

## 2023-05-23 ENCOUNTER — Other Ambulatory Visit: Payer: Medicare Other

## 2023-05-23 ENCOUNTER — Ambulatory Visit: Payer: Medicare Other

## 2023-05-28 ENCOUNTER — Other Ambulatory Visit: Payer: Self-pay

## 2023-05-29 NOTE — Assessment & Plan Note (Signed)
[id number] with numerous small cavitary lesions in bilateral lungs, MMR normal  -diagnosed 07/2021, after incidental finding on CT scan for kidney stone, by colonoscopy/EUS. -Baseline CA 19-9 elevated to 273 on 07/03/21 -He began first-line mFOLFIRINOX on 08/13/21, but required dose reduction and move to every 3 weeks. -his CA 19-9 trended up but is now stable. -restaging CT CAP 08/12/22 showed: multiple new and enlarging pulmonary metastases; pancreatic mass grossly stable. Will repeat in 3 months. -we changed his irinotecan to liposomal on 09/05/22. He tolerated better and feels stronger. -Due to disease progression, his treatment was changed to gemcitabine and Abraxane every 2 weeks on 01/02/2023 -He tolerated the first two cycles chemotherapy very well with mild fatigue.  I increased gemcitabine to 1000mg /m2 from cycle 3 -Restaging CT scan 03/12/2023 showed stable disease overall. His primary pancreatic tumor is slightly increased in size, however his multiple cavitary lung nodules are slightly improved with thinner walls now.   -will continue current therapy.  He is overall tolerating well, but he has developed worsening fatigue lately.  I will reduce gemcitabine dose to 800 mg/m again. -His tumor marker CA 19.9 continues trending down, which is a good clinical sign. -Plan to repeat restaging CT scan in late July,

## 2023-05-29 NOTE — Assessment & Plan Note (Signed)
-  f/u with PCP -overall controlled  

## 2023-05-29 NOTE — Assessment & Plan Note (Signed)
-  He is on Xanax and Ambien as needed -He still has a few nights of insomnia despite Ambien or Xanax after treatment, I previously prescribed lorazepam.  Due to the multiple benzos, I discontinued lorazepam -He also has some signs of depression, I started him on mirtazapine 7.5 mg HS

## 2023-05-29 NOTE — Assessment & Plan Note (Signed)
-  Patient understands his treatment is palliative, to prolong his life. -He has agreed with DNR. 

## 2023-05-30 ENCOUNTER — Other Ambulatory Visit: Payer: Self-pay

## 2023-05-30 ENCOUNTER — Inpatient Hospital Stay: Payer: Medicare Other

## 2023-05-30 ENCOUNTER — Inpatient Hospital Stay: Payer: Medicare Other | Attending: Hematology

## 2023-05-30 ENCOUNTER — Inpatient Hospital Stay (HOSPITAL_BASED_OUTPATIENT_CLINIC_OR_DEPARTMENT_OTHER): Payer: Medicare Other | Admitting: Hematology

## 2023-05-30 ENCOUNTER — Encounter: Payer: Self-pay | Admitting: Hematology

## 2023-05-30 VITALS — BP 118/71 | HR 77 | Temp 98.1°F | Resp 15 | Ht 71.0 in | Wt 196.3 lb

## 2023-05-30 DIAGNOSIS — C7800 Secondary malignant neoplasm of unspecified lung: Secondary | ICD-10-CM | POA: Diagnosis not present

## 2023-05-30 DIAGNOSIS — E1122 Type 2 diabetes mellitus with diabetic chronic kidney disease: Secondary | ICD-10-CM

## 2023-05-30 DIAGNOSIS — I2510 Atherosclerotic heart disease of native coronary artery without angina pectoris: Secondary | ICD-10-CM | POA: Insufficient documentation

## 2023-05-30 DIAGNOSIS — N132 Hydronephrosis with renal and ureteral calculous obstruction: Secondary | ICD-10-CM | POA: Diagnosis not present

## 2023-05-30 DIAGNOSIS — G4700 Insomnia, unspecified: Secondary | ICD-10-CM | POA: Diagnosis not present

## 2023-05-30 DIAGNOSIS — K7689 Other specified diseases of liver: Secondary | ICD-10-CM | POA: Insufficient documentation

## 2023-05-30 DIAGNOSIS — I700 Atherosclerosis of aorta: Secondary | ICD-10-CM | POA: Diagnosis not present

## 2023-05-30 DIAGNOSIS — E119 Type 2 diabetes mellitus without complications: Secondary | ICD-10-CM | POA: Diagnosis not present

## 2023-05-30 DIAGNOSIS — F419 Anxiety disorder, unspecified: Secondary | ICD-10-CM | POA: Diagnosis not present

## 2023-05-30 DIAGNOSIS — R590 Localized enlarged lymph nodes: Secondary | ICD-10-CM | POA: Insufficient documentation

## 2023-05-30 DIAGNOSIS — E785 Hyperlipidemia, unspecified: Secondary | ICD-10-CM | POA: Insufficient documentation

## 2023-05-30 DIAGNOSIS — C259 Malignant neoplasm of pancreas, unspecified: Secondary | ICD-10-CM | POA: Insufficient documentation

## 2023-05-30 DIAGNOSIS — Z7901 Long term (current) use of anticoagulants: Secondary | ICD-10-CM | POA: Insufficient documentation

## 2023-05-30 DIAGNOSIS — R5383 Other fatigue: Secondary | ICD-10-CM | POA: Insufficient documentation

## 2023-05-30 DIAGNOSIS — Z5111 Encounter for antineoplastic chemotherapy: Secondary | ICD-10-CM | POA: Diagnosis present

## 2023-05-30 DIAGNOSIS — Z794 Long term (current) use of insulin: Secondary | ICD-10-CM

## 2023-05-30 DIAGNOSIS — I480 Paroxysmal atrial fibrillation: Secondary | ICD-10-CM | POA: Insufficient documentation

## 2023-05-30 DIAGNOSIS — Z86010 Personal history of colonic polyps: Secondary | ICD-10-CM | POA: Insufficient documentation

## 2023-05-30 DIAGNOSIS — Z7189 Other specified counseling: Secondary | ICD-10-CM | POA: Diagnosis not present

## 2023-05-30 DIAGNOSIS — I82890 Acute embolism and thrombosis of other specified veins: Secondary | ICD-10-CM | POA: Insufficient documentation

## 2023-05-30 DIAGNOSIS — Z87442 Personal history of urinary calculi: Secondary | ICD-10-CM | POA: Diagnosis not present

## 2023-05-30 DIAGNOSIS — Z79899 Other long term (current) drug therapy: Secondary | ICD-10-CM | POA: Insufficient documentation

## 2023-05-30 LAB — CMP (CANCER CENTER ONLY)
ALT: 21 U/L (ref 0–44)
AST: 20 U/L (ref 15–41)
Albumin: 4 g/dL (ref 3.5–5.0)
Alkaline Phosphatase: 83 U/L (ref 38–126)
Anion gap: 6 (ref 5–15)
BUN: 19 mg/dL (ref 8–23)
CO2: 27 mmol/L (ref 22–32)
Calcium: 9.3 mg/dL (ref 8.9–10.3)
Chloride: 108 mmol/L (ref 98–111)
Creatinine: 1.01 mg/dL (ref 0.61–1.24)
GFR, Estimated: >60 mL/min (ref >60)
Glucose, Bld: 158 mg/dL — ABNORMAL HIGH (ref 70–99)
Potassium: 3.8 mmol/L (ref 3.5–5.1)
Sodium: 141 mmol/L (ref 135–145)
Total Bilirubin: 0.7 mg/dL (ref 0.3–1.2)
Total Protein: 6.8 g/dL (ref 6.5–8.1)

## 2023-05-30 LAB — CBC WITH DIFFERENTIAL (CANCER CENTER ONLY)
Abs Immature Granulocytes: 0.03 10*3/uL (ref 0.00–0.07)
Basophils Absolute: 0 10*3/uL (ref 0.0–0.1)
Basophils Relative: 1 %
Eosinophils Absolute: 0.2 10*3/uL (ref 0.0–0.5)
Eosinophils Relative: 3 %
HCT: 38.5 % — ABNORMAL LOW (ref 39.0–52.0)
Hemoglobin: 13.1 g/dL (ref 13.0–17.0)
Immature Granulocytes: 0 %
Lymphocytes Relative: 24 %
Lymphs Abs: 1.8 10*3/uL (ref 0.7–4.0)
MCH: 34.5 pg — ABNORMAL HIGH (ref 26.0–34.0)
MCHC: 34 g/dL (ref 30.0–36.0)
MCV: 101.3 fL — ABNORMAL HIGH (ref 80.0–100.0)
Monocytes Absolute: 0.7 10*3/uL (ref 0.1–1.0)
Monocytes Relative: 9 %
Neutro Abs: 4.6 10*3/uL (ref 1.7–7.7)
Neutrophils Relative %: 63 %
Platelet Count: 180 10*3/uL (ref 150–400)
RBC: 3.8 MIL/uL — ABNORMAL LOW (ref 4.22–5.81)
RDW: 13.4 % (ref 11.5–15.5)
WBC Count: 7.3 10*3/uL (ref 4.0–10.5)
nRBC: 0 % (ref 0.0–0.2)

## 2023-05-30 MED ORDER — SODIUM CHLORIDE 0.9% FLUSH
10.0000 mL | INTRAVENOUS | Status: DC | PRN
  Administered 2023-05-30: 10 mL

## 2023-05-30 MED ORDER — TRAMADOL HCL 50 MG PO TABS
50.0000 mg | ORAL_TABLET | Freq: Four times a day (QID) | ORAL | 0 refills | Status: DC | PRN
Start: 2023-05-30 — End: 2023-06-26

## 2023-05-30 MED ORDER — HEPARIN SOD (PORK) LOCK FLUSH 100 UNIT/ML IV SOLN
500.0000 [IU] | Freq: Once | INTRAVENOUS | Status: AC | PRN
  Administered 2023-05-30: 500 [IU]

## 2023-05-30 MED ORDER — SODIUM CHLORIDE 0.9 % IV SOLN: Freq: Once | INTRAVENOUS | Status: AC

## 2023-05-30 MED ORDER — PROCHLORPERAZINE MALEATE 10 MG PO TABS
10.0000 mg | ORAL_TABLET | Freq: Once | ORAL | Status: AC
  Administered 2023-05-30: 10 mg via ORAL
  Filled 2023-05-30: qty 1

## 2023-05-30 MED ORDER — PACLITAXEL PROTEIN-BOUND CHEMO INJECTION 100 MG
100.0000 mg/m2 | Freq: Once | INTRAVENOUS | Status: AC
  Administered 2023-05-30: 200 mg via INTRAVENOUS
  Filled 2023-05-30: qty 40

## 2023-05-30 MED ORDER — ALPRAZOLAM 0.5 MG PO TABS: 0.5000 mg | ORAL_TABLET | Freq: Three times a day (TID) | ORAL | 0 refills | Status: DC | PRN

## 2023-05-30 MED ORDER — SODIUM CHLORIDE 0.9 % IV SOLN
800.0000 mg/m2 | Freq: Once | INTRAVENOUS | Status: AC
  Administered 2023-05-30: 1634 mg via INTRAVENOUS
  Filled 2023-05-30: qty 42.97

## 2023-05-30 NOTE — Patient Instructions (Signed)
[facility]  Discharge Instructions: Thank you for choosing [facility] to provide your oncology and hematology care.   If you have a lab appointment with the Cancer Center, please go directly to the Cancer Center and check in at the registration area.   Wear comfortable clothing and clothing appropriate for easy access to any Portacath or PICC line.   We strive to give you quality time with your provider. You may need to reschedule your appointment if you arrive late (15 or more minutes).  Arriving late affects you and other patients whose appointments are after yours.  Also, if you miss three or more appointments without notifying the office, you may be dismissed from the clinic at the provider's discretion.      For prescription refill requests, have your pharmacy contact our office and allow 72 hours for refills to be completed.    Today you received the following chemotherapy and/or immunotherapy agents: Abraxane, Gemcitabine.       To help prevent nausea and vomiting after your treatment, we encourage you to take your nausea medication as directed.  BELOW ARE SYMPTOMS THAT SHOULD BE REPORTED IMMEDIATELY: *FEVER GREATER THAN 100.4 F (38 C) OR HIGHER *CHILLS OR SWEATING *NAUSEA AND VOMITING THAT IS NOT CONTROLLED WITH YOUR NAUSEA MEDICATION *UNUSUAL SHORTNESS OF BREATH *UNUSUAL BRUISING OR BLEEDING *URINARY PROBLEMS (pain or burning when urinating, or frequent urination) *BOWEL PROBLEMS (unusual diarrhea, constipation, pain near the anus) TENDERNESS IN MOUTH AND THROAT WITH OR WITHOUT PRESENCE OF ULCERS (sore throat, sores in mouth, or a toothache) UNUSUAL RASH, SWELLING OR PAIN  UNUSUAL VAGINAL DISCHARGE OR ITCHING   Items with * indicate a potential emergency and should be followed up as soon as possible or go to the Emergency Department if any problems should occur.  Please show the CHEMOTHERAPY ALERT CARD or IMMUNOTHERAPY ALERT  CARD at check-in to the Emergency Department and triage nurse.  Should you have questions after your visit or need to cancel or reschedule your appointment, please contact [facility]  Dept: [phone]  and follow the prompts.  Office hours are 8:00 a.m. to 4:30 p.m. Monday - Friday. Please note that voicemails left after 4:00 p.m. may not be returned until the following business day.  We are closed weekends and major holidays. You have access to a nurse at all times for urgent questions. Please call the main number to the clinic Dept: [phone] and follow the prompts.   For any non-urgent questions, you may also contact your provider using MyChart. We now offer e-Visits for anyone 18 and older to request care online for non-urgent symptoms. For details visit [email or web address].   Also download the MyChart app! Go to the app store, search "MyChart", open the app, select [facility], and log in with your MyChart username and password.   

## 2023-05-30 NOTE — Progress Notes (Signed)
[facility]   [phone] [phone]   Clinic Follow up Note   Patient Care Team: Lucky Cowboy, MD as PCP - General (Internal Medicine) Pollyann Samples, NP as PCP - Hematology/Oncology (Nurse Practitioner) Thomasene Ripple, DO as PCP - Cardiology (Cardiology) Nadara Mustard, MD as Consulting Physician (Orthopedic Surgery) Laurey Morale, MD as Consulting Physician (Cardiology) Malachy Mood, MD as Consulting Physician (Oncology)  Date of Service:  05/30/2023  CHIEF COMPLAINT: f/u of metastatic pancreatic cancer     CURRENT THERAPY:  Gemcitabine and Abraxane q28d   ASSESSMENT:  Zachary Reid is a 67 y.o. male with   Pancreatic adenocarcinoma (HCC) [phone] with numerous small cavitary lesions in bilateral lungs, MMR normal  -diagnosed 07/2021, after incidental finding on CT scan for kidney stone, by colonoscopy/EUS. -Baseline CA 19-9 elevated to 273 on 07/03/21 -He began first-line mFOLFIRINOX on 08/13/21, but required dose reduction and move to every 3 weeks. -his CA 19-9 trended up but is now stable. -restaging CT CAP 08/12/22 showed: multiple new and enlarging pulmonary metastases; pancreatic mass grossly stable. Will repeat in 3 months. -we changed his irinotecan to liposomal on 09/05/22. He tolerated better and feels stronger. -Due to disease progression, his treatment was changed to gemcitabine and Abraxane every 2 weeks on 01/02/2023 -He tolerated the first two cycles chemotherapy very well with mild fatigue.  I increased gemcitabine to 1000mg /m2 from cycle 3 -Restaging CT scan 03/12/2023 showed stable disease overall. His primary pancreatic tumor is slightly increased in size, however his multiple cavitary lung nodules are slightly improved with thinner walls now.   -will continue current therapy.  He is overall tolerating well, but he has developed worsening fatigue lately.  I will reduce gemcitabine dose to 800 mg/m again. -His tumor marker CA  19.9 continues trending down, which is a good clinical sign. -he has a trip in late August, and he prefers to have scan after his trip   Diabetes (HCC) -f/u with PCP -overall controlled     Anxiety -He is on Xanax and Ambien as needed -He still has a few nights of insomnia despite Ambien or Xanax after treatment, I previously prescribed lorazepam.  Due to the multiple benzos, I discontinued lorazepam -He also has some signs of depression, I started him on mirtazapine 7.5 mg HS    Goals of care, counseling/discussion -Patient understands his treatment is palliative, to prolong his life. -He has agreed with DNR.       PLAN: -lab reviewed-normal -CMP-normal -proceed with treatment same dose -no treatment 8/22 due to his trip  - I Refill tramadol and Xanax - I order CT CAP to be done in first week of August     SUMMARY OF ONCOLOGIC HISTORY: Oncology History Overview Note   Cancer Staging  Pancreatic adenocarcinoma [facility]) Staging form: Exocrine Pancreas, AJCC 8th Edition - Clinical stage from 07/26/2021: Stage IV (cT4, cN0, cM1) - Signed by Malachy Mood, MD on 07/28/2021    Pancreatic adenocarcinoma (HCC)  07/02/2021 Initial Diagnosis   Pancreatic adenocarcinoma (HCC)   07/26/2021 Cancer Staging   Staging form: Exocrine Pancreas, AJCC 8th Edition - Clinical stage from 07/26/2021: Stage IV (cT4, cN0, cM1) - Signed by Malachy Mood, MD on 07/28/2021 Stage prefix: Initial diagnosis Total positive nodes: 0   08/13/2021 - 07/06/2022 Chemotherapy   Patient is on Treatment Plan : PANCREAS Modified FOLFIRINOX q14d x 4 cycles      Genetic Testing   [facility] CancerNext-Expanded results (77 genes) were negative. No  pathogenic variants were identified. 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 The report date is 08/15/2021.    The CancerNext-Expanded gene panel offered by [facility] and includes sequencing, rearrangement, and RNA analysis for the following 77  genes: AIP, ALK, APC, ATM, AXIN2, BAP1, BARD1, BLM, BMPR1A, BRCA1, BRCA2, BRIP1, CDC73, CDH1, CDK4, CDKN1B, CDKN2A, CHEK2, CTNNA1, DICER1, FANCC, FH, FLCN, GALNT12, KIF1B, LZTR1, MAX, MEN1, MET, MLH1, MSH2, MSH3, MSH6, MUTYH, NBN, NF1, NF2, NTHL1, PALB2, PHOX2B, PMS2, POT1, PRKAR1A, PTCH1, PTEN, RAD51C, RAD51D, RB1, RECQL, RET, SDHA, SDHAF2, SDHB, SDHC, SDHD, SMAD4, SMARCA4, SMARCB1, SMARCE1, STK11, SUFU, TMEM127, TP53, TSC1, TSC2, VHL and XRCC2 (sequencing and deletion/duplication); EGFR, EGLN1, HOXB13, KIT, MITF, PDGFRA, POLD1, and POLE (sequencing only); EPCAM and GREM1 (deletion/duplication only).     08/13/2021 - 12/06/2022 Chemotherapy   Patient is on Treatment Plan : PANCREAS Modified FOLFIRINOX q14d x 4 cycles     10/25/2021 Imaging   EXAM: CT CHEST, ABDOMEN, AND PELVIS WITH CONTRAST  IMPRESSION: 1. Innumerable bilateral pulmonary nodules, many of which are cavitary, consistent with metastatic disease. These nodules show no substantial change in are minimally progressed in the interval. 2. Mix cystic and solid lesion in the head and body of the pancreas is similar to prior and also comparing back to MRI 07/02/2021. 3. Hepatic cysts. 4. 8 mm nonobstructing left renal stone. 5. Aortic Atherosclerosis (ICD10-I70.0).   01/28/2022 Imaging   EXAM: CT CHEST, ABDOMEN, AND PELVIS WITH CONTRAST  IMPRESSION: 1. Innumerable bilateral small solid and cavitary pulmonary nodules, some of the cavitary nodules are slightly less thick-walled when compared with prior exam. 2. Mixed cystic and solid lesion in the head and body of the pancreas is similar to prior exam. 3. Nonobstructing left renal stone. 4.  Aortic Atherosclerosis (ICD10-I70.0).   11/29/2022 Imaging    IMPRESSION: 1. No significant change in primary pancreatic mass and adjacent cystic component. Unchanged appearance of soft tissue extending to involve the adjacent celiac axis, superior mesenteric artery origin, and portal  confluence. 2. 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 3. Innumerable small pulmonary nodules throughout the lungs, the majority of which are cavitary. Although interval change is difficult to appreciate for the majority of these nodules due to small size, at least some of these are slightly enlarged, consistent with worsened pulmonary metastatic disease. 4. Newly enlarged left retroperitoneal lymph nodes, which may reflect nodal metastatic disease or perhaps reactive to left hydronephrosis. Attention on follow-up. 5. A sizable calculus previously seen in the left renal pelvis has migrated to the middle third of the left ureter, with placement of a double-J left ureteral stent, with formed pigtails in the left renal pelvis and urinary bladder. Moderate left hydronephrosis and proximal hydroureter. 6. Coronary artery disease.   Aortic Atherosclerosis (ICD10-I70.0).   01/02/2023 -  Chemotherapy   Patient is on Treatment Plan : PANCREATIC Abraxane D1,8,15 + Gemcitabine D1,8,15 q28d     03/12/2023 Imaging    IMPRESSION: 1. Primary pancreatic mass with adjacent cystic component is slightly increased in size when compared with the prior exam. Similar soft tissue extending involve the celiac axis superior mesenteric artery origin and portal confluence. 2. Innumerable bilateral pulmonary nodules, some of which are cavitary, unchanged when compared with the prior. 3. Left retroperitoneal lymph nodes are decreased in size, likely resolving reactive adenopathy 4. Interval removal of left ureter stent.  No hydronephrosis. 5. coronary artery disease and aortic Atherosclerosis (ICD10-I70.0).  INTERVAL HISTORY:  Zachary Reid is here for a follow up of metastatic pancreatic cancer . He was last seen by me on 05/09/2023. He presents to the clinic alone. Pt state that he get a facial rash when he goes a little  Longer without shaving. Pt state that his fatigue was a little worse, due to him moving his daughter to a new place. Pt denies having fever and chills. He state that his pain is stable.Pt state that he has vacation plan for 8/26      All other systems were reviewed with the patient and are negative.  MEDICAL HISTORY:  Past Medical History:  Diagnosis Date   Adenocarcinoma of pancreas, stage 4 (HCC) 07/2021   oncologist--- dr Mosetta Putt;   mets to  bilateral lung;  started chemo 08-13-2021   Anemia    GAD (generalized anxiety disorder)    History of adenomatous polyp of colon    Hyperlipidemia    Hypogonadism male    IBS (irritable bowel syndrome)    Insomnia    Left ureteral calculus    Metastatic cancer to lung (HCC) 07/2021   primary pancreatic cancer w/ numerous small cavity lesions bilateral lungs   PAF (paroxysmal atrial fibrillation) (HCC) 10/31/2022   cardiologist-- dr Kirtland Bouchard. tobb;  newly dx ED 10-31-2022 w/ RVR  in setting flank pain (kidney stone)  --  office note in epic 11-07-2022 normal ETT 11-13-2022, echo 11-07-2022 ef 65-70% w/ mild LVH, mild MR,  zio monitor not completed,  started on toprol and xarelto daily   Personal history of chemotherapy    Receiving chemotherapy for metastatic pancreatic cancer. Most recent infusion (as of 12/02/22) was on 11/15/22.   Port-A-Cath in place 08/10/2021   Type 2 diabetes mellitus treated with insulin [facility])    endocrinologist--- dr Lucianne Muss   Vitamin D deficiency    takes Vit D supplements   Wears contact lenses     SURGICAL HISTORY: Past Surgical History:  Procedure Laterality Date   APPENDECTOMY  1988   BIOPSY  07/26/2021   Procedure: BIOPSY;  Surgeon: Lemar Lofty., MD;  Location: [facility] ENDOSCOPY;  Service: Gastroenterology;;   COLONOSCOPY WITH PROPOFOL N/A 07/26/2021   Procedure: COLONOSCOPY WITH PROPOFOL;  Surgeon: Lemar Lofty., MD;  Location: [facility] ENDOSCOPY;  Service: Gastroenterology;  Laterality: N/A;   CYSTOSCOPY  WITH STENT PLACEMENT Left 10/31/2022   Procedure: CYSTOSCOPY WITH STENT PLACEMENT;  Surgeon: Belva Agee, MD;  Location: [facility] ORS;  Service: Urology;  Laterality: Left;   CYSTOSCOPY/URETEROSCOPY/HOLMIUM LASER/STENT PLACEMENT Left 12/03/2022   Procedure: CYSTOSCOPY/LEFT URETEROSCOPY/HOLMIUM LASER/STENT EXCHANGE;  Surgeon: Belva Agee, MD;  Location: [facility];  Service: Urology;  Laterality: Left;  1 HR FOR CASE   ESOPHAGOGASTRODUODENOSCOPY (EGD) WITH PROPOFOL N/A 07/26/2021   Procedure: ESOPHAGOGASTRODUODENOSCOPY (EGD) WITH PROPOFOL;  Surgeon: Meridee Score Netty Starring., MD;  Location: [facility] ENDOSCOPY;  Service: Gastroenterology;  Laterality: N/A;   EUS N/A 07/26/2021   Procedure: UPPER ENDOSCOPIC ULTRASOUND (EUS) RADIAL;  Surgeon: Lemar Lofty., MD;  Location: [facility] ENDOSCOPY;  Service: Gastroenterology;  Laterality: N/A;   FINE NEEDLE ASPIRATION  07/26/2021   Procedure: FINE NEEDLE ASPIRATION (FNA) LINEAR;  Surgeon: Lemar Lofty., MD;  Location: [facility] ENDOSCOPY;  Service: Gastroenterology;;   IR IMAGING GUIDED PORT INSERTION  08/10/2021   POLYPECTOMY  07/26/2021   Procedure: POLYPECTOMY;  Surgeon: Lemar Lofty., MD;  Location: [facility] ENDOSCOPY;  Service: Gastroenterology;;   SHOULDER SURGERY Right    as a teenager    I have  reviewed the social history and family history with the patient and they are unchanged from previous note.  ALLERGIES:  is allergic to lipitor [atorvastatin].  MEDICATIONS:  Current Outpatient Medications  Medication Sig Dispense Refill   ALPRAZolam (XANAX) 0.5 MG tablet Take 1 tablet (0.5 mg total) by mouth 3 (three) times daily as needed for anxiety. 60 tablet 0   Cholecalciferol (VITAMIN D3) 125 MCG (5000 UT) CAPS Take 5,000 Units by mouth in the morning and at bedtime.     Continuous Blood Gluc Sensor (DEXCOM G7 SENSOR) MISC 1 Device by Does not apply route as directed. Change sensor every 10 days (Patient taking differently:  1 Device by Does not apply route as directed. Change sensor every 10 days Per patient on 12/02/22, he has not yet received glucose monitor.) 3 each 3   gabapentin (NEURONTIN) 600 MG tablet Take 1 tablet (600 mg total) by mouth 3 (three) times daily as needed (pain). 60 tablet 1   glucose blood test strip Use as instructed 100 each 12   hyoscyamine (LEVSIN SL) 0.125 MG SL tablet DISSOLVE ONE TABLET UNDER THE TONGUE 4 TIMES DAILY UP TO EVERY 4 HOURS AS NEEDED FOR NAUSEA, BLOATING, CRAMPING, OR DIARRHEA (Patient not taking: Reported on 12/02/2022) 100 tablet 0   insulin degludec (TRESIBA FLEXTOUCH) 100 UNIT/ML FlexTouch Pen INJECT 32 UNITS INTO THE SKIN DAILY. 15 mL 1   insulin lispro (HUMALOG KWIKPEN) 100 UNIT/ML KwikPen 10 TO 15 UNITS BEFORE MEALS AS DIRECTED (Patient taking differently: Inject 10-12 Units into the skin 3 (three) times daily before meals.) 15 mL 1   metoprolol succinate (TOPROL XL) 25 MG 24 hr tablet Take 0.5 tablets (12.5 mg total) by mouth daily. (Patient taking differently: Take 12.5 mg by mouth daily. Per patient, he will start taking Metoprolol on 12/02/22 and take again on the day of surgery (12/03/22.)) 45 tablet 3   mirtazapine (REMERON) 7.5 MG tablet Take 1 tablet (7.5 mg total) by mouth at bedtime. 30 tablet 0   potassium chloride (KLOR-CON M) 10 MEQ tablet Take 1 tablet (10 mEq total) by mouth 2 (two) times daily. 14 tablet 0   prochlorperazine (COMPAZINE) 10 MG tablet Take 1 tablet (10 mg total) by mouth every 6 (six) hours as needed (Nausea or vomiting). 60 tablet 1   rivaroxaban (XARELTO) 20 MG TABS tablet Take 1 tablet (20 mg total) by mouth daily with supper. (Patient taking differently: Take 20 mg by mouth daily with supper. Patient stated that he last took Xarelto on 11/30/22.) 90 tablet 3   silver sulfADIAZINE (SILVADENE) 1 % cream Apply 1 Application topically daily. 20 g 0   traMADol (ULTRAM) 50 MG tablet Take 1-2 tablets (50-100 mg total) by mouth every 6 (six) hours as  needed. 120 tablet 0   zolpidem (AMBIEN) 10 MG tablet Take 1 tablet (10 mg total) by mouth at bedtime as needed. for sleep 60 tablet 2   No current facility-administered medications for this visit.   Facility-Administered Medications Ordered in Other Visits  Medication Dose Route Frequency Provider Last Rate Last Admin   gemcitabine (GEMZAR) 1,634 mg in sodium chloride 0.9 % 250 mL chemo infusion  800 mg/m2 (Treatment Plan Recorded) Intravenous Once Malachy Mood, MD       heparin lock flush 100 unit/mL  500 Units Intracatheter Once PRN Malachy Mood, MD       PACLitaxel-protein bound (ABRAXANE) chemo infusion 200 mg  100 mg/m2 (Treatment Plan Recorded) Intravenous Once Malachy Mood, MD  prochlorperazine (COMPAZINE) tablet 10 mg  10 mg Oral Once Malachy Mood, MD       sodium chloride flush (NS) 0.9 % injection 10 mL  10 mL Intracatheter PRN Malachy Mood, MD        PHYSICAL EXAMINATION: ECOG PERFORMANCE STATUS: 2 - Symptomatic, <50% confined to bed  Vitals:   05/30/23 0848  BP: 118/71  Pulse: 77  Resp: 15  Temp: 98.1 F (36.7 C)  SpO2: 97%   Wt Readings from Last 3 Encounters:  05/30/23 196 lb 4.8 oz (89 kg)  05/09/23 196 lb 11.2 oz (89.2 kg)  04/25/23 198 lb 9.6 oz (90.1 kg)     GENERAL:alert, no distress and comfortable SKIN: skin color normal, no rashes or significant lesions EYES: normal, Conjunctiva are pink and non-injected, sclera clear  NEURO: alert & oriented x 3 with fluent speech   LABORATORY DATA:  I have reviewed the data as listed    Latest Ref Rng & Units 05/30/2023    8:23 AM 05/09/2023    8:36 AM 04/25/2023    8:47 AM  CBC  WBC 4.0 - 10.5 K/uL 7.3  7.4  5.5   Hemoglobin 13.0 - 17.0 g/dL 40.9  81.1  91.4   Hematocrit 39.0 - 52.0 % 38.5  38.8  34.7   Platelets 150 - 400 K/uL 180  124  104         Latest Ref Rng & Units 05/30/2023    8:23 AM 05/09/2023    8:36 AM 04/25/2023    8:47 AM  CMP  Glucose 70 - 99 mg/dL 782  956  213   BUN 8 - 23 mg/dL 19  20  17     Creatinine 0.61 - 1.24 mg/dL 0.86  5.78  4.69   Sodium 135 - 145 mmol/L 141  140  139   Potassium 3.5 - 5.1 mmol/L 3.8  3.8  3.9   Chloride 98 - 111 mmol/L 108  108  106   CO2 22 - 32 mmol/L 27  26  27    Calcium 8.9 - 10.3 mg/dL 9.3  9.1  9.1   Total Protein 6.5 - 8.1 g/dL 6.8  6.1  6.8   Total Bilirubin 0.3 - 1.2 mg/dL 0.7  0.4  0.5   Alkaline Phos 38 - 126 U/L 83  83  89   AST 15 - 41 U/L 20  19  21    ALT 0 - 44 U/L 21  19  20        RADIOGRAPHIC STUDIES: I have personally reviewed the radiological images as listed and agreed with the findings in the report. No results found.    Orders Placed This Encounter  Procedures   Cancer antigen 19-9    Standing Status:   Standing    Number of Occurrences:   20    Standing Expiration Date:   05/29/2024   All questions were answered. The patient knows to call the clinic with any problems, questions or concerns. No barriers to learning was detected. The total time spent in the appointment was 30 minutes.     Malachy Mood, MD 05/30/2023   Carolin Coy, CMA, am acting as scribe for Malachy Mood, MD.   I have reviewed the above documentation for accuracy and completeness, and I agree with the above.

## 2023-06-02 ENCOUNTER — Other Ambulatory Visit: Payer: Self-pay

## 2023-06-06 ENCOUNTER — Ambulatory Visit: Payer: Medicare Other | Admitting: Hematology

## 2023-06-06 ENCOUNTER — Other Ambulatory Visit: Payer: Medicare Other

## 2023-06-06 ENCOUNTER — Ambulatory Visit: Payer: Medicare Other

## 2023-06-12 NOTE — Assessment & Plan Note (Signed)
-  He is on Xanax and Ambien as needed -He still has a few nights of insomnia despite Ambien or Xanax after treatment, I previously prescribed lorazepam.  Due to the multiple benzos, I discontinued lorazepam -He also has some signs of depression, I started him on mirtazapine 7.5 mg HS

## 2023-06-12 NOTE — Assessment & Plan Note (Signed)
-  Patient understands his treatment is palliative, to prolong his life. -He has agreed with DNR. 

## 2023-06-12 NOTE — Assessment & Plan Note (Signed)
[id number] with numerous small cavitary lesions in bilateral lungs, MMR normal  -diagnosed 07/2021, after incidental finding on CT scan for kidney stone, by colonoscopy/EUS. -Baseline CA 19-9 elevated to 273 on 07/03/21 -He began first-line mFOLFIRINOX on 08/13/21, but required dose reduction and move to every 3 weeks. -his CA 19-9 trended up but is now stable. -restaging CT CAP 08/12/22 showed: multiple new and enlarging pulmonary metastases; pancreatic mass grossly stable. Will repeat in 3 months. -we changed his irinotecan to liposomal on 09/05/22. He tolerated better and feels stronger. -Due to disease progression, his treatment was changed to gemcitabine and Abraxane every 2 weeks on 01/02/2023 -He tolerated the first two cycles chemotherapy very well with mild fatigue.  I increased gemcitabine to 1000mg /m2 from cycle 3 -Restaging CT scan 03/12/2023 showed stable disease overall. His primary pancreatic tumor is slightly increased in size, however his multiple cavitary lung nodules are slightly improved with thinner walls now.   -will continue current therapy.  He is overall tolerating well, but he has developed worsening fatigue lately.  I will reduce gemcitabine dose to 800 mg/m again. -His tumor marker CA 19.9 continues trending down, which is a good clinical sign. -Plan to repeat restaging CT scan in 1-2 weeks

## 2023-06-12 NOTE — Assessment & Plan Note (Signed)
-  f/u with PCP -overall controlled

## 2023-06-13 ENCOUNTER — Other Ambulatory Visit: Payer: Self-pay | Admitting: Endocrinology

## 2023-06-13 ENCOUNTER — Other Ambulatory Visit: Payer: Self-pay

## 2023-06-13 ENCOUNTER — Encounter: Payer: Self-pay | Admitting: Hematology

## 2023-06-13 ENCOUNTER — Inpatient Hospital Stay: Payer: Medicare Other

## 2023-06-13 ENCOUNTER — Inpatient Hospital Stay (HOSPITAL_BASED_OUTPATIENT_CLINIC_OR_DEPARTMENT_OTHER): Payer: Medicare Other | Admitting: Hematology

## 2023-06-13 ENCOUNTER — Telehealth: Payer: Self-pay

## 2023-06-13 VITALS — BP 126/77 | HR 68 | Temp 97.8°F | Resp 14 | Ht 71.0 in | Wt 196.4 lb

## 2023-06-13 DIAGNOSIS — C259 Malignant neoplasm of pancreas, unspecified: Secondary | ICD-10-CM

## 2023-06-13 DIAGNOSIS — Z5111 Encounter for antineoplastic chemotherapy: Secondary | ICD-10-CM | POA: Diagnosis not present

## 2023-06-13 DIAGNOSIS — Z95828 Presence of other vascular implants and grafts: Secondary | ICD-10-CM

## 2023-06-13 DIAGNOSIS — Z794 Long term (current) use of insulin: Secondary | ICD-10-CM

## 2023-06-13 DIAGNOSIS — Z7189 Other specified counseling: Secondary | ICD-10-CM

## 2023-06-13 DIAGNOSIS — E1122 Type 2 diabetes mellitus with diabetic chronic kidney disease: Secondary | ICD-10-CM | POA: Diagnosis not present

## 2023-06-13 DIAGNOSIS — E860 Dehydration: Secondary | ICD-10-CM

## 2023-06-13 DIAGNOSIS — F419 Anxiety disorder, unspecified: Secondary | ICD-10-CM | POA: Diagnosis not present

## 2023-06-13 LAB — CMP (CANCER CENTER ONLY)
ALT: 19 U/L (ref 0–44)
AST: 19 U/L (ref 15–41)
Albumin: 3.8 g/dL (ref 3.5–5.0)
Alkaline Phosphatase: 86 U/L (ref 38–126)
Anion gap: 6 (ref 5–15)
BUN: 19 mg/dL (ref 8–23)
CO2: 25 mmol/L (ref 22–32)
Calcium: 9 mg/dL (ref 8.9–10.3)
Chloride: 110 mmol/L (ref 98–111)
Creatinine: 0.98 mg/dL (ref 0.61–1.24)
GFR, Estimated: >60 mL/min (ref >60)
Glucose, Bld: 180 mg/dL — ABNORMAL HIGH (ref 70–99)
Potassium: 4.3 mmol/L (ref 3.5–5.1)
Sodium: 141 mmol/L (ref 135–145)
Total Bilirubin: 0.5 mg/dL (ref 0.3–1.2)
Total Protein: 6.5 g/dL (ref 6.5–8.1)

## 2023-06-13 LAB — CBC WITH DIFFERENTIAL (CANCER CENTER ONLY)
Abs Immature Granulocytes: 0.04 10*3/uL (ref 0.00–0.07)
Basophils Absolute: 0 10*3/uL (ref 0.0–0.1)
Basophils Relative: 1 %
Eosinophils Absolute: 0.3 10*3/uL (ref 0.0–0.5)
Eosinophils Relative: 4 %
HCT: 40.4 % (ref 39.0–52.0)
Hemoglobin: 13.9 g/dL (ref 13.0–17.0)
Immature Granulocytes: 1 %
Lymphocytes Relative: 23 %
Lymphs Abs: 1.7 10*3/uL (ref 0.7–4.0)
MCH: 34.3 pg — ABNORMAL HIGH (ref 26.0–34.0)
MCHC: 34.4 g/dL (ref 30.0–36.0)
MCV: 99.8 fL (ref 80.0–100.0)
Monocytes Absolute: 0.6 10*3/uL (ref 0.1–1.0)
Monocytes Relative: 8 %
Neutro Abs: 4.6 10*3/uL (ref 1.7–7.7)
Neutrophils Relative %: 63 %
Platelet Count: 130 10*3/uL — ABNORMAL LOW (ref 150–400)
RBC: 4.05 MIL/uL — ABNORMAL LOW (ref 4.22–5.81)
RDW: 13.2 % (ref 11.5–15.5)
WBC Count: 7.3 10*3/uL (ref 4.0–10.5)
nRBC: 0 % (ref 0.0–0.2)

## 2023-06-13 MED ORDER — HEPARIN SOD (PORK) LOCK FLUSH 100 UNIT/ML IV SOLN
500.0000 [IU] | Freq: Once | INTRAVENOUS | Status: AC | PRN
  Administered 2023-06-13: 500 [IU]

## 2023-06-13 MED ORDER — SODIUM CHLORIDE 0.9 % IV SOLN: Freq: Once | INTRAVENOUS | Status: DC

## 2023-06-13 MED ORDER — SODIUM CHLORIDE 0.9 % IV SOLN
800.0000 mg/m2 | Freq: Once | INTRAVENOUS | Status: AC
  Administered 2023-06-13: 1634 mg via INTRAVENOUS
  Filled 2023-06-13: qty 42.97

## 2023-06-13 MED ORDER — PACLITAXEL PROTEIN-BOUND CHEMO INJECTION 100 MG
100.0000 mg/m2 | Freq: Once | INTRAVENOUS | Status: AC
  Administered 2023-06-13: 200 mg via INTRAVENOUS
  Filled 2023-06-13: qty 40

## 2023-06-13 MED ORDER — PROCHLORPERAZINE MALEATE 10 MG PO TABS
10.0000 mg | ORAL_TABLET | Freq: Once | ORAL | Status: AC
  Administered 2023-06-13: 10 mg via ORAL
  Filled 2023-06-13: qty 1

## 2023-06-13 MED ORDER — SODIUM CHLORIDE 0.9 % IV SOLN: Freq: Once | INTRAVENOUS | Status: AC

## 2023-06-13 MED ORDER — SODIUM CHLORIDE 0.9% FLUSH
10.0000 mL | Freq: Once | INTRAVENOUS | Status: AC
  Administered 2023-06-13: 10 mL

## 2023-06-13 MED ORDER — MIRTAZAPINE 7.5 MG PO TABS: 7.5000 mg | ORAL_TABLET | Freq: Every day | ORAL | 1 refills | Status: DC

## 2023-06-13 MED ORDER — SODIUM CHLORIDE 0.9% FLUSH
10.0000 mL | INTRAVENOUS | Status: DC | PRN
  Administered 2023-06-13: 10 mL

## 2023-06-13 NOTE — Progress Notes (Signed)
[facility]   [phone] [phone]   Clinic Follow up Note   Patient Care Team: Lucky Cowboy, MD as PCP - General (Internal Medicine) Pollyann Samples, NP as PCP - Hematology/Oncology (Nurse Practitioner) Thomasene Ripple, DO as PCP - Cardiology (Cardiology) Nadara Mustard, MD as Consulting Physician (Orthopedic Surgery) Laurey Morale, MD as Consulting Physician (Cardiology) Malachy Mood, MD as Consulting Physician (Oncology)  Date of Service:  06/13/2023  CHIEF COMPLAINT: f/u of metastatic pancreatic cancer      CURRENT THERAPY:  Gemcitabine and Abraxane q28d     ASSESSMENT:  Zachary Reid is a 67 y.o. male with   Pancreatic adenocarcinoma (HCC) [phone] with numerous small cavitary lesions in bilateral lungs, MMR normal  -diagnosed 07/2021, after incidental finding on CT scan for kidney stone, by colonoscopy/EUS. -Baseline CA 19-9 elevated to 273 on 07/03/21 -He began first-line mFOLFIRINOX on 08/13/21, but required dose reduction and move to every 3 weeks. -his CA 19-9 trended up but is now stable. -restaging CT CAP 08/12/22 showed: multiple new and enlarging pulmonary metastases; pancreatic mass grossly stable. Will repeat in 3 months. -we changed his irinotecan to liposomal on 09/05/22. He tolerated better and feels stronger. -Due to disease progression, his treatment was changed to gemcitabine and Abraxane every 2 weeks on 01/02/2023 -He tolerated the first two cycles chemotherapy very well with mild fatigue.  I increased gemcitabine to 1000mg /m2 from cycle 3 -Restaging CT scan 03/12/2023 showed stable disease overall. His primary pancreatic tumor is slightly increased in size, however his multiple cavitary lung nodules are slightly improved with thinner walls now.   -will continue current therapy.  He is overall tolerating well, but he has developed worsening fatigue lately.  I will reduce gemcitabine dose to 800 mg/m again. -His tumor marker  CA 19.9 continues trending down, which is a good clinical sign. -Plan to repeat restaging CT scan in early Sep   Diabetes [facility]) -f/u with PCP -overall controlled     Anxiety -He is on Xanax and Ambien as needed -He still has a few nights of insomnia despite Ambien or Xanax after treatment, I previously prescribed lorazepam.  Due to the multiple benzos, I discontinued lorazepam -He also has some signs of depression, I started him on mirtazapine 7.5 mg HS    Goals of care, counseling/discussion -Patient understands his treatment is palliative, to prolong his life. -He has agreed with DNR.    PLAN: -will proceed chemo today at same dose  -plan to reduce dose of his Aug 8 treatment due to trip after  - CT scan in early September -refilled mirtazapine     SUMMARY OF ONCOLOGIC HISTORY: Oncology History Overview Note   Cancer Staging  Pancreatic adenocarcinoma [facility]) Staging form: Exocrine Pancreas, AJCC 8th Edition - Clinical stage from 07/26/2021: Stage IV (cT4, cN0, cM1) - Signed by Malachy Mood, MD on 07/28/2021    Pancreatic adenocarcinoma (HCC)  07/02/2021 Initial Diagnosis   Pancreatic adenocarcinoma (HCC)   07/26/2021 Cancer Staging   Staging form: Exocrine Pancreas, AJCC 8th Edition - Clinical stage from 07/26/2021: Stage IV (cT4, cN0, cM1) - Signed by Malachy Mood, MD on 07/28/2021 Stage prefix: Initial diagnosis Total positive nodes: 0   08/13/2021 - 07/06/2022 Chemotherapy   Patient is on Treatment Plan : PANCREAS Modified FOLFIRINOX q14d x 4 cycles      Genetic Testing   [employer] CancerNext-Expanded results (77 genes) were negative. No pathogenic variants were identified. A variant of uncertain significance (VUS) was identified  in the CDKN1B gene. The report date is 08/15/2021.    The CancerNext-Expanded gene panel offered by [employer] and includes sequencing, rearrangement, and RNA analysis for the following 77 genes: AIP, ALK, APC, ATM, AXIN2, BAP1, BARD1, BLM, BMPR1A,  BRCA1, BRCA2, BRIP1, CDC73, CDH1, CDK4, CDKN1B, CDKN2A, CHEK2, CTNNA1, DICER1, FANCC, FH, FLCN, GALNT12, KIF1B, LZTR1, MAX, MEN1, MET, MLH1, MSH2, MSH3, MSH6, MUTYH, NBN, NF1, NF2, NTHL1, PALB2, PHOX2B, PMS2, POT1, PRKAR1A, PTCH1, PTEN, RAD51C, RAD51D, RB1, RECQL, RET, SDHA, SDHAF2, SDHB, SDHC, SDHD, SMAD4, SMARCA4, SMARCB1, SMARCE1, STK11, SUFU, TMEM127, TP53, TSC1, TSC2, VHL and XRCC2 (sequencing and deletion/duplication); EGFR, EGLN1, HOXB13, KIT, MITF, PDGFRA, POLD1, and POLE (sequencing only); EPCAM and GREM1 (deletion/duplication only).     08/13/2021 - 12/06/2022 Chemotherapy   Patient is on Treatment Plan : PANCREAS Modified FOLFIRINOX q14d x 4 cycles     10/25/2021 Imaging   EXAM: CT CHEST, ABDOMEN, AND PELVIS WITH CONTRAST  IMPRESSION: 1. Innumerable bilateral pulmonary nodules, many of which are cavitary, consistent with metastatic disease. These nodules show no substantial change in are minimally progressed in the interval. 2. Mix cystic and solid lesion in the head and body of the pancreas is similar to prior and also comparing back to MRI 07/02/2021. 3. Hepatic cysts. 4. 8 mm nonobstructing left renal stone. 5. Aortic Atherosclerosis (ICD10-I70.0).   01/28/2022 Imaging   EXAM: CT CHEST, ABDOMEN, AND PELVIS WITH CONTRAST  IMPRESSION: 1. Innumerable bilateral small solid and cavitary pulmonary nodules, some of the cavitary nodules are slightly less thick-walled when compared with prior exam. 2. Mixed cystic and solid lesion in the head and body of the pancreas is similar to prior exam. 3. Nonobstructing left renal stone. 4.  Aortic Atherosclerosis (ICD10-I70.0).   11/29/2022 Imaging    IMPRESSION: 1. No significant change in primary pancreatic mass and adjacent cystic component. Unchanged appearance of soft tissue extending to involve the adjacent celiac axis, superior mesenteric artery origin, and portal confluence. 2. 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 3. Innumerable small pulmonary nodules throughout the lungs, the majority of which are cavitary. Although interval change is difficult to appreciate for the majority of these nodules due to small size, at least some of these are slightly enlarged, consistent with worsened pulmonary metastatic disease. 4. Newly enlarged left retroperitoneal lymph nodes, which may reflect nodal metastatic disease or perhaps reactive to left hydronephrosis. Attention on follow-up. 5. A sizable calculus previously seen in the left renal pelvis has migrated to the middle third of the left ureter, with placement of a double-J left ureteral stent, with formed pigtails in the left renal pelvis and urinary bladder. Moderate left hydronephrosis and proximal hydroureter. 6. Coronary artery disease.   Aortic Atherosclerosis (ICD10-I70.0).   01/02/2023 -  Chemotherapy   Patient is on Treatment Plan : PANCREATIC Abraxane D1,8,15 + Gemcitabine D1,8,15 q28d     03/12/2023 Imaging    IMPRESSION: 1. Primary pancreatic mass with adjacent cystic component is slightly increased in size when compared with the prior exam. Similar soft tissue extending involve the celiac axis superior mesenteric artery origin and portal confluence. 2. Innumerable bilateral pulmonary nodules, some of which are cavitary, unchanged when compared with the prior. 3. Left retroperitoneal lymph nodes are decreased in size, likely resolving reactive adenopathy 4. Interval removal of left ureter stent.  No hydronephrosis. 5. coronary artery disease and aortic Atherosclerosis (ICD10-I70.0).      INTERVAL HISTORY:  Zachary Reid is here for a  follow up of metastatic pancreatic cancer . He was last seen by me on 05/30/2023. He presents to the clinic alone. Weight is stable patient reports he has had several good day. No questions or concerns.      All other systems were  reviewed with the patient and are negative.  MEDICAL HISTORY:  Past Medical History:  Diagnosis Date   Adenocarcinoma of pancreas, stage 4 (HCC) 07/2021   oncologist--- dr Mosetta Putt;   mets to  bilateral lung;  started chemo 08-13-2021   Anemia    GAD (generalized anxiety disorder)    History of adenomatous polyp of colon    Hyperlipidemia    Hypogonadism male    IBS (irritable bowel syndrome)    Insomnia    Left ureteral calculus    Metastatic cancer to lung (HCC) 07/2021   primary pancreatic cancer w/ numerous small cavity lesions bilateral lungs   PAF (paroxysmal atrial fibrillation) (HCC) 10/31/2022   cardiologist-- dr Kirtland Bouchard. tobb;  newly dx ED 10-31-2022 w/ RVR  in setting flank pain (kidney stone)  --  office note in epic 11-07-2022 normal ETT 11-13-2022, echo 11-07-2022 ef 65-70% w/ mild LVH, mild MR,  zio monitor not completed,  started on toprol and xarelto daily   Personal history of chemotherapy    Receiving chemotherapy for metastatic pancreatic cancer. Most recent infusion (as of 12/02/22) was on 11/15/22.   Port-A-Cath in place 08/10/2021   Type 2 diabetes mellitus treated with insulin [facility])    endocrinologist--- dr Lucianne Muss   Vitamin D deficiency    takes Vit D supplements   Wears contact lenses     SURGICAL HISTORY: Past Surgical History:  Procedure Laterality Date   APPENDECTOMY  1988   BIOPSY  07/26/2021   Procedure: BIOPSY;  Surgeon: Lemar Lofty., MD;  Location: [facility] ENDOSCOPY;  Service: Gastroenterology;;   COLONOSCOPY WITH PROPOFOL N/A 07/26/2021   Procedure: COLONOSCOPY WITH PROPOFOL;  Surgeon: Lemar Lofty., MD;  Location: [facility] ENDOSCOPY;  Service: Gastroenterology;  Laterality: N/A;   CYSTOSCOPY WITH STENT PLACEMENT Left 10/31/2022   Procedure: CYSTOSCOPY WITH STENT PLACEMENT;  Surgeon: Belva Agee, MD;  Location: [facility] ORS;  Service: Urology;  Laterality: Left;   CYSTOSCOPY/URETEROSCOPY/HOLMIUM LASER/STENT PLACEMENT Left 12/03/2022   Procedure:  CYSTOSCOPY/LEFT URETEROSCOPY/HOLMIUM LASER/STENT EXCHANGE;  Surgeon: Belva Agee, MD;  Location: [facility];  Service: Urology;  Laterality: Left;  1 HR FOR CASE   ESOPHAGOGASTRODUODENOSCOPY (EGD) WITH PROPOFOL N/A 07/26/2021   Procedure: ESOPHAGOGASTRODUODENOSCOPY (EGD) WITH PROPOFOL;  Surgeon: Meridee Score Netty Starring., MD;  Location: [facility] ENDOSCOPY;  Service: Gastroenterology;  Laterality: N/A;   EUS N/A 07/26/2021   Procedure: UPPER ENDOSCOPIC ULTRASOUND (EUS) RADIAL;  Surgeon: Lemar Lofty., MD;  Location: [facility] ENDOSCOPY;  Service: Gastroenterology;  Laterality: N/A;   FINE NEEDLE ASPIRATION  07/26/2021   Procedure: FINE NEEDLE ASPIRATION (FNA) LINEAR;  Surgeon: Meridee Score Netty Starring., MD;  Location: [facility] ENDOSCOPY;  Service: Gastroenterology;;   IR IMAGING GUIDED PORT INSERTION  08/10/2021   POLYPECTOMY  07/26/2021   Procedure: POLYPECTOMY;  Surgeon: Meridee Score Netty Starring., MD;  Location: [facility] ENDOSCOPY;  Service: Gastroenterology;;   SHOULDER SURGERY Right    as a teenager    I have reviewed the social history and family history with the patient and they are unchanged from previous note.  ALLERGIES:  is allergic to lipitor [atorvastatin].  MEDICATIONS:  Current Outpatient Medications  Medication Sig Dispense Refill   ALPRAZolam (XANAX) 0.5 MG tablet Take 1 tablet (0.5 mg total) by  mouth 3 (three) times daily as needed for anxiety. 60 tablet 0   Cholecalciferol (VITAMIN D3) 125 MCG (5000 UT) CAPS Take 5,000 Units by mouth in the morning and at bedtime.     Continuous Blood Gluc Sensor (DEXCOM G7 SENSOR) MISC 1 Device by Does not apply route as directed. Change sensor every 10 days (Patient taking differently: 1 Device by Does not apply route as directed. Change sensor every 10 days Per patient on 12/02/22, he has not yet received glucose monitor.) 3 each 3   gabapentin (NEURONTIN) 600 MG tablet Take 1 tablet (600 mg total) by mouth 3 (three) times daily as needed  (pain). 60 tablet 1   glucose blood test strip Use as instructed 100 each 12   hyoscyamine (LEVSIN SL) 0.125 MG SL tablet DISSOLVE ONE TABLET UNDER THE TONGUE 4 TIMES DAILY UP TO EVERY 4 HOURS AS NEEDED FOR NAUSEA, BLOATING, CRAMPING, OR DIARRHEA (Patient not taking: Reported on 12/02/2022) 100 tablet 0   insulin degludec (TRESIBA FLEXTOUCH) 100 UNIT/ML FlexTouch Pen INJECT 32 UNITS INTO THE SKIN DAILY. 15 mL 1   insulin lispro (HUMALOG KWIKPEN) 100 UNIT/ML KwikPen 10 TO 15 UNITS BEFORE MEALS AS DIRECTED (Patient taking differently: Inject 10-12 Units into the skin 3 (three) times daily before meals.) 15 mL 1   metoprolol succinate (TOPROL XL) 25 MG 24 hr tablet Take 0.5 tablets (12.5 mg total) by mouth daily. (Patient taking differently: Take 12.5 mg by mouth daily. Per patient, he will start taking Metoprolol on 12/02/22 and take again on the day of surgery (12/03/22.)) 45 tablet 3   mirtazapine (REMERON) 7.5 MG tablet Take 1 tablet (7.5 mg total) by mouth at bedtime. 90 tablet 1   potassium chloride (KLOR-CON M) 10 MEQ tablet Take 1 tablet (10 mEq total) by mouth 2 (two) times daily. 14 tablet 0   prochlorperazine (COMPAZINE) 10 MG tablet Take 1 tablet (10 mg total) by mouth every 6 (six) hours as needed (Nausea or vomiting). 60 tablet 1   rivaroxaban (XARELTO) 20 MG TABS tablet Take 1 tablet (20 mg total) by mouth daily with supper. (Patient taking differently: Take 20 mg by mouth daily with supper. Patient stated that he last took Xarelto on 11/30/22.) 90 tablet 3   silver sulfADIAZINE (SILVADENE) 1 % cream Apply 1 Application topically daily. 20 g 0   traMADol (ULTRAM) 50 MG tablet Take 1-2 tablets (50-100 mg total) by mouth every 6 (six) hours as needed. 120 tablet 0   zolpidem (AMBIEN) 10 MG tablet Take 1 tablet (10 mg total) by mouth at bedtime as needed. for sleep 60 tablet 2   No current facility-administered medications for this visit.   Facility-Administered Medications Ordered in Other  Visits  Medication Dose Route Frequency Provider Last Rate Last Admin   0.9 %  sodium chloride infusion   Intravenous Once Malachy Mood, MD       sodium chloride flush (NS) 0.9 % injection 10 mL  10 mL Intracatheter PRN Malachy Mood, MD   10 mL at 06/13/23 1155    PHYSICAL EXAMINATION: ECOG PERFORMANCE STATUS: 1 - Symptomatic but completely ambulatory  Vitals:   06/13/23 0915  BP: 126/77  Pulse: 68  Resp: 14  Temp: 97.8 F (36.6 C)  SpO2: 100%   Wt Readings from Last 3 Encounters:  06/13/23 196 lb 6.4 oz (89.1 kg)  05/30/23 196 lb 4.8 oz (89 kg)  05/09/23 196 lb 11.2 oz (89.2 kg)     GENERAL:alert, no distress  and comfortable SKIN: skin color, texture, turgor are normal, no rashes or significant lesions EYES: normal, Conjunctiva are pink and non-injected, sclera clear NECK: supple, thyroid normal size, non-tender, without nodularity LYMPH:  no palpable lymphadenopathy in the cervical, axillary  LUNGS: clear to auscultation and percussion with normal breathing effort HEART: regular rate & rhythm and no murmurs and no lower extremity edema ABDOMEN:abdomen soft, non-tender and normal bowel sounds Musculoskeletal:no cyanosis of digits and no clubbing  NEURO: alert & oriented x 3 with fluent speech, no focal motor/sensory deficits  LABORATORY DATA:  I have reviewed the data as listed    Latest Ref Rng & Units 06/13/2023    8:53 AM 05/30/2023    8:23 AM 05/09/2023    8:36 AM  CBC  WBC 4.0 - 10.5 K/uL 7.3  7.3  7.4   Hemoglobin 13.0 - 17.0 g/dL 16.1  09.6  04.5   Hematocrit 39.0 - 52.0 % 40.4  38.5  38.8   Platelets 150 - 400 K/uL 130  180  124         Latest Ref Rng & Units 06/13/2023    8:53 AM 05/30/2023    8:23 AM 05/09/2023    8:36 AM  CMP  Glucose 70 - 99 mg/dL 409  811  914   BUN 8 - 23 mg/dL 19  19  20    Creatinine 0.61 - 1.24 mg/dL 7.82  9.56  2.13   Sodium 135 - 145 mmol/L 141  141  140   Potassium 3.5 - 5.1 mmol/L 4.3  3.8  3.8   Chloride 98 - 111 mmol/L 110  108   108   CO2 22 - 32 mmol/L 25  27  26    Calcium 8.9 - 10.3 mg/dL 9.0  9.3  9.1   Total Protein 6.5 - 8.1 g/dL 6.5  6.8  6.1   Total Bilirubin 0.3 - 1.2 mg/dL 0.5  0.7  0.4   Alkaline Phos 38 - 126 U/L 86  83  83   AST 15 - 41 U/L 19  20  19    ALT 0 - 44 U/L 19  21  19        RADIOGRAPHIC STUDIES: I have personally reviewed the radiological images as listed and agreed with the findings in the report. No results found.    No orders of the defined types were placed in this encounter.  All questions were answered. The patient knows to call the clinic with any problems, questions or concerns. No barriers to learning was detected. The total time spent in the appointment was 25 minutes.     Malachy Mood, MD 06/13/2023   I, Sharlette Dense, CMA, am acting as scribe for Malachy Mood, MD.   I have reviewed the above documentation for accuracy and completeness, and I agree with the above.

## 2023-06-13 NOTE — Telephone Encounter (Signed)
Patient seen by Dr. America Brown are within treatment parameters.  Labs reviewed: and are within treatment parameters.  Per physician team, patient is ready for treatment and there are NO modifications to the treatment plan.

## 2023-06-13 NOTE — Patient Instructions (Signed)
[facility]  Discharge Instructions: Thank you for choosing [facility] to provide your oncology and hematology care.   If you have a lab appointment with the Cancer Center, please go directly to the Cancer Center and check in at the registration area.   Wear comfortable clothing and clothing appropriate for easy access to any Portacath or PICC line.   We strive to give you quality time with your provider. You may need to reschedule your appointment if you arrive late (15 or more minutes).  Arriving late affects you and other patients whose appointments are after yours.  Also, if you miss three or more appointments without notifying the office, you may be dismissed from the clinic at the provider's discretion.      For prescription refill requests, have your pharmacy contact our office and allow 72 hours for refills to be completed.    Today you received the following chemotherapy and/or immunotherapy agents: Abraxane, Gemcitabine.       To help prevent nausea and vomiting after your treatment, we encourage you to take your nausea medication as directed.  BELOW ARE SYMPTOMS THAT SHOULD BE REPORTED IMMEDIATELY: *FEVER GREATER THAN 100.4 F (38 C) OR HIGHER *CHILLS OR SWEATING *NAUSEA AND VOMITING THAT IS NOT CONTROLLED WITH YOUR NAUSEA MEDICATION *UNUSUAL SHORTNESS OF BREATH *UNUSUAL BRUISING OR BLEEDING *URINARY PROBLEMS (pain or burning when urinating, or frequent urination) *BOWEL PROBLEMS (unusual diarrhea, constipation, pain near the anus) TENDERNESS IN MOUTH AND THROAT WITH OR WITHOUT PRESENCE OF ULCERS (sore throat, sores in mouth, or a toothache) UNUSUAL RASH, SWELLING OR PAIN  UNUSUAL VAGINAL DISCHARGE OR ITCHING   Items with * indicate a potential emergency and should be followed up as soon as possible or go to the Emergency Department if any problems should occur.  Please show the CHEMOTHERAPY ALERT CARD or IMMUNOTHERAPY ALERT  CARD at check-in to the Emergency Department and triage nurse.  Should you have questions after your visit or need to cancel or reschedule your appointment, please contact [facility]  Dept: [phone]  and follow the prompts.  Office hours are 8:00 a.m. to 4:30 p.m. Monday - Friday. Please note that voicemails left after 4:00 p.m. may not be returned until the following business day.  We are closed weekends and major holidays. You have access to a nurse at all times for urgent questions. Please call the main number to the clinic Dept: [phone] and follow the prompts.   For any non-urgent questions, you may also contact your provider using MyChart. We now offer e-Visits for anyone 18 and older to request care online for non-urgent symptoms. For details visit [email or web address].   Also download the MyChart app! Go to the app store, search "MyChart", open the app, select [facility], and log in with your MyChart username and password.   

## 2023-06-17 ENCOUNTER — Other Ambulatory Visit: Payer: Self-pay | Admitting: Endocrinology

## 2023-06-26 ENCOUNTER — Inpatient Hospital Stay (HOSPITAL_BASED_OUTPATIENT_CLINIC_OR_DEPARTMENT_OTHER): Payer: Medicare Other | Admitting: Nurse Practitioner

## 2023-06-26 ENCOUNTER — Inpatient Hospital Stay: Payer: Medicare Other | Attending: Hematology

## 2023-06-26 ENCOUNTER — Other Ambulatory Visit: Payer: Self-pay

## 2023-06-26 ENCOUNTER — Encounter: Payer: Self-pay | Admitting: Nurse Practitioner

## 2023-06-26 ENCOUNTER — Inpatient Hospital Stay: Payer: Medicare Other

## 2023-06-26 VITALS — BP 121/88 | HR 67 | Temp 97.7°F | Resp 13 | Wt 199.8 lb

## 2023-06-26 DIAGNOSIS — Z86010 Personal history of colonic polyps: Secondary | ICD-10-CM | POA: Insufficient documentation

## 2023-06-26 DIAGNOSIS — K7689 Other specified diseases of liver: Secondary | ICD-10-CM | POA: Diagnosis not present

## 2023-06-26 DIAGNOSIS — Z7901 Long term (current) use of anticoagulants: Secondary | ICD-10-CM | POA: Insufficient documentation

## 2023-06-26 DIAGNOSIS — I700 Atherosclerosis of aorta: Secondary | ICD-10-CM | POA: Diagnosis not present

## 2023-06-26 DIAGNOSIS — Z5111 Encounter for antineoplastic chemotherapy: Secondary | ICD-10-CM | POA: Diagnosis present

## 2023-06-26 DIAGNOSIS — C257 Malignant neoplasm of other parts of pancreas: Secondary | ICD-10-CM | POA: Diagnosis not present

## 2023-06-26 DIAGNOSIS — Z95828 Presence of other vascular implants and grafts: Secondary | ICD-10-CM

## 2023-06-26 DIAGNOSIS — C259 Malignant neoplasm of pancreas, unspecified: Secondary | ICD-10-CM

## 2023-06-26 DIAGNOSIS — I480 Paroxysmal atrial fibrillation: Secondary | ICD-10-CM | POA: Insufficient documentation

## 2023-06-26 DIAGNOSIS — R5383 Other fatigue: Secondary | ICD-10-CM | POA: Diagnosis not present

## 2023-06-26 DIAGNOSIS — E860 Dehydration: Secondary | ICD-10-CM

## 2023-06-26 DIAGNOSIS — C7801 Secondary malignant neoplasm of right lung: Secondary | ICD-10-CM | POA: Insufficient documentation

## 2023-06-26 DIAGNOSIS — E559 Vitamin D deficiency, unspecified: Secondary | ICD-10-CM | POA: Diagnosis not present

## 2023-06-26 DIAGNOSIS — Z87442 Personal history of urinary calculi: Secondary | ICD-10-CM | POA: Diagnosis not present

## 2023-06-26 DIAGNOSIS — Z8042 Family history of malignant neoplasm of prostate: Secondary | ICD-10-CM | POA: Diagnosis not present

## 2023-06-26 DIAGNOSIS — Z794 Long term (current) use of insulin: Secondary | ICD-10-CM | POA: Insufficient documentation

## 2023-06-26 DIAGNOSIS — C7802 Secondary malignant neoplasm of left lung: Secondary | ICD-10-CM | POA: Diagnosis not present

## 2023-06-26 DIAGNOSIS — I82890 Acute embolism and thrombosis of other specified veins: Secondary | ICD-10-CM | POA: Diagnosis not present

## 2023-06-26 DIAGNOSIS — R590 Localized enlarged lymph nodes: Secondary | ICD-10-CM | POA: Diagnosis not present

## 2023-06-26 DIAGNOSIS — E785 Hyperlipidemia, unspecified: Secondary | ICD-10-CM | POA: Insufficient documentation

## 2023-06-26 DIAGNOSIS — Z800 Family history of malignant neoplasm of digestive organs: Secondary | ICD-10-CM | POA: Diagnosis not present

## 2023-06-26 DIAGNOSIS — N132 Hydronephrosis with renal and ureteral calculous obstruction: Secondary | ICD-10-CM | POA: Diagnosis not present

## 2023-06-26 DIAGNOSIS — E119 Type 2 diabetes mellitus without complications: Secondary | ICD-10-CM | POA: Diagnosis not present

## 2023-06-26 DIAGNOSIS — I2510 Atherosclerotic heart disease of native coronary artery without angina pectoris: Secondary | ICD-10-CM | POA: Diagnosis not present

## 2023-06-26 LAB — CBC WITH DIFFERENTIAL (CANCER CENTER ONLY)
Abs Immature Granulocytes: 0.05 10*3/uL (ref 0.00–0.07)
Basophils Absolute: 0 10*3/uL (ref 0.0–0.1)
Basophils Relative: 0 %
Eosinophils Absolute: 0.1 10*3/uL (ref 0.0–0.5)
Eosinophils Relative: 1 %
HCT: 36.8 % — ABNORMAL LOW (ref 39.0–52.0)
Hemoglobin: 12.7 g/dL — ABNORMAL LOW (ref 13.0–17.0)
Immature Granulocytes: 1 %
Lymphocytes Relative: 14 %
Lymphs Abs: 1.4 10*3/uL (ref 0.7–4.0)
MCH: 33.8 pg (ref 26.0–34.0)
MCHC: 34.5 g/dL (ref 30.0–36.0)
MCV: 97.9 fL (ref 80.0–100.0)
Monocytes Absolute: 1.1 10*3/uL — ABNORMAL HIGH (ref 0.1–1.0)
Monocytes Relative: 10 %
Neutro Abs: 7.6 10*3/uL (ref 1.7–7.7)
Neutrophils Relative %: 74 %
Platelet Count: 103 10*3/uL — ABNORMAL LOW (ref 150–400)
RBC: 3.76 MIL/uL — ABNORMAL LOW (ref 4.22–5.81)
RDW: 13.7 % (ref 11.5–15.5)
WBC Count: 10.3 10*3/uL (ref 4.0–10.5)
nRBC: 0 % (ref 0.0–0.2)

## 2023-06-26 LAB — CMP (CANCER CENTER ONLY)
ALT: 15 U/L (ref 0–44)
AST: 15 U/L (ref 15–41)
Albumin: 3.7 g/dL (ref 3.5–5.0)
Alkaline Phosphatase: 74 U/L (ref 38–126)
Anion gap: 5 (ref 5–15)
BUN: 11 mg/dL (ref 8–23)
CO2: 28 mmol/L (ref 22–32)
Calcium: 8.8 mg/dL — ABNORMAL LOW (ref 8.9–10.3)
Chloride: 108 mmol/L (ref 98–111)
Creatinine: 1.02 mg/dL (ref 0.61–1.24)
GFR, Estimated: >60 mL/min (ref >60)
Glucose, Bld: 139 mg/dL — ABNORMAL HIGH (ref 70–99)
Potassium: 3.9 mmol/L (ref 3.5–5.1)
Sodium: 141 mmol/L (ref 135–145)
Total Bilirubin: 0.7 mg/dL (ref 0.3–1.2)
Total Protein: 6.8 g/dL (ref 6.5–8.1)

## 2023-06-26 MED ORDER — PACLITAXEL PROTEIN-BOUND CHEMO INJECTION 100 MG
80.0000 mg/m2 | Freq: Once | INTRAVENOUS | Status: AC
  Administered 2023-06-26: 175 mg via INTRAVENOUS
  Filled 2023-06-26: qty 35

## 2023-06-26 MED ORDER — PROCHLORPERAZINE MALEATE 10 MG PO TABS
10.0000 mg | ORAL_TABLET | Freq: Once | ORAL | Status: AC
  Administered 2023-06-26: 10 mg via ORAL
  Filled 2023-06-26: qty 1

## 2023-06-26 MED ORDER — TRAMADOL HCL 50 MG PO TABS: 50.0000 mg | ORAL_TABLET | Freq: Four times a day (QID) | ORAL | 0 refills | Status: DC | PRN

## 2023-06-26 MED ORDER — HEPARIN SOD (PORK) LOCK FLUSH 100 UNIT/ML IV SOLN
500.0000 [IU] | Freq: Once | INTRAVENOUS | Status: AC | PRN
  Administered 2023-06-26: 500 [IU]

## 2023-06-26 MED ORDER — SODIUM CHLORIDE 0.9% FLUSH
10.0000 mL | Freq: Once | INTRAVENOUS | Status: AC
  Administered 2023-06-26: 10 mL

## 2023-06-26 MED ORDER — SODIUM CHLORIDE 0.9 % IV SOLN: Freq: Once | INTRAVENOUS | Status: AC

## 2023-06-26 MED ORDER — SODIUM CHLORIDE 0.9 % IV SOLN
800.0000 mg/m2 | Freq: Once | INTRAVENOUS | Status: AC
  Administered 2023-06-26: 1634 mg via INTRAVENOUS
  Filled 2023-06-26: qty 42.97

## 2023-06-26 MED ORDER — SODIUM CHLORIDE 0.9% FLUSH
10.0000 mL | INTRAVENOUS | Status: DC | PRN
  Administered 2023-06-26: 10 mL

## 2023-06-26 NOTE — Progress Notes (Signed)
Patient Care Team: Lucky Cowboy, MD as PCP - General (Internal Medicine) Pollyann Samples, NP as PCP - Hematology/Oncology (Nurse Practitioner) Thomasene Ripple, DO as PCP - Cardiology (Cardiology) Nadara Mustard, MD as Consulting Physician (Orthopedic Surgery) Laurey Morale, MD as Consulting Physician (Cardiology) Malachy Mood, MD as Consulting Physician (Oncology)   CHIEF COMPLAINT: Follow up pancreatic cancer  Oncology History Overview Note   Cancer Staging  Pancreatic adenocarcinoma [facility]) Staging form: Exocrine Pancreas, AJCC 8th Edition - Clinical stage from 07/26/2021: Stage IV (cT4, cN0, cM1) - Signed by Malachy Mood, MD on 07/28/2021    Pancreatic adenocarcinoma (HCC)  07/02/2021 Initial Diagnosis   Pancreatic adenocarcinoma (HCC)   07/26/2021 Cancer Staging   Staging form: Exocrine Pancreas, AJCC 8th Edition - Clinical stage from 07/26/2021: Stage IV (cT4, cN0, cM1) - Signed by Malachy Mood, MD on 07/28/2021 Stage prefix: Initial diagnosis Total positive nodes: 0   08/13/2021 - 07/06/2022 Chemotherapy   Patient is on Treatment Plan : PANCREAS Modified FOLFIRINOX q14d x 4 cycles      Genetic Testing   [facility] CancerNext-Expanded results (77 genes) were negative. No pathogenic variants were identified. 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 The report date is 08/15/2021.    The CancerNext-Expanded gene panel offered by [facility] and includes sequencing, rearrangement, and RNA analysis for the following 77 genes: AIP, ALK, APC, ATM, AXIN2, BAP1, BARD1, BLM, BMPR1A, BRCA1, BRCA2, BRIP1, CDC73, CDH1, CDK4, CDKN1B, CDKN2A, CHEK2, CTNNA1, DICER1, FANCC, FH, FLCN, GALNT12, KIF1B, LZTR1, MAX, MEN1, MET, MLH1, MSH2, MSH3, MSH6, MUTYH, NBN, NF1, NF2, NTHL1, PALB2, PHOX2B, PMS2, POT1, PRKAR1A, PTCH1, PTEN, RAD51C, RAD51D, RB1, RECQL, RET, SDHA, SDHAF2, SDHB, SDHC, SDHD, SMAD4, SMARCA4, SMARCB1, SMARCE1, STK11, SUFU, TMEM127, TP53, TSC1, TSC2, VHL and XRCC2  (sequencing and deletion/duplication); EGFR, EGLN1, HOXB13, KIT, MITF, PDGFRA, POLD1, and POLE (sequencing only); EPCAM and GREM1 (deletion/duplication only).     08/13/2021 - 12/06/2022 Chemotherapy   Patient is on Treatment Plan : PANCREAS Modified FOLFIRINOX q14d x 4 cycles     10/25/2021 Imaging   EXAM: CT CHEST, ABDOMEN, AND PELVIS WITH CONTRAST  IMPRESSION: 1. Innumerable bilateral pulmonary nodules, many of which are cavitary, consistent with metastatic disease. These nodules show no substantial change in are minimally progressed in the interval. 2. Mix cystic and solid lesion in the head and body of the pancreas is similar to prior and also comparing back to MRI 07/02/2021. 3. Hepatic cysts. 4. 8 mm nonobstructing left renal stone. 5. Aortic Atherosclerosis (ICD10-I70.0).   01/28/2022 Imaging   EXAM: CT CHEST, ABDOMEN, AND PELVIS WITH CONTRAST  IMPRESSION: 1. Innumerable bilateral small solid and cavitary pulmonary nodules, some of the cavitary nodules are slightly less thick-walled when compared with prior exam. 2. Mixed cystic and solid lesion in the head and body of the pancreas is similar to prior exam. 3. Nonobstructing left renal stone. 4.  Aortic Atherosclerosis (ICD10-I70.0).   11/29/2022 Imaging    IMPRESSION: 1. No significant change in primary pancreatic mass and adjacent cystic component. Unchanged appearance of soft tissue extending to involve the adjacent celiac axis, superior mesenteric artery origin, and portal confluence. 2. 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 3. Innumerable small pulmonary nodules throughout the lungs, the majority of which are cavitary. Although interval change is difficult to appreciate for the majority of these nodules due to small size, at least some of these are slightly enlarged, consistent with worsened  pulmonary metastatic disease. 4. Newly  enlarged left retroperitoneal lymph nodes, which may reflect nodal metastatic disease or perhaps reactive to left hydronephrosis. Attention on follow-up. 5. A sizable calculus previously seen in the left renal pelvis has migrated to the middle third of the left ureter, with placement of a double-J left ureteral stent, with formed pigtails in the left renal pelvis and urinary bladder. Moderate left hydronephrosis and proximal hydroureter. 6. Coronary artery disease.   Aortic Atherosclerosis (ICD10-I70.0).   01/02/2023 -  Chemotherapy   Patient is on Treatment Plan : PANCREATIC Abraxane D1,8,15 + Gemcitabine D1,8,15 q28d     03/12/2023 Imaging    IMPRESSION: 1. Primary pancreatic mass with adjacent cystic component is slightly increased in size when compared with the prior exam. Similar soft tissue extending involve the celiac axis superior mesenteric artery origin and portal confluence. 2. Innumerable bilateral pulmonary nodules, some of which are cavitary, unchanged when compared with the prior. 3. Left retroperitoneal lymph nodes are decreased in size, likely resolving reactive adenopathy 4. Interval removal of left ureter stent.  No hydronephrosis. 5. coronary artery disease and aortic Atherosclerosis (ICD10-I70.0).      CURRENT THERAPY: Gemcitabine and Abraxane, day 1, 15 q28d starting 12/2022  INTERVAL HISTORY Zachary Reid returns for follow up as scheduled, last seen by Dr. Mosetta Putt 06/13/23. He continues Gem/abraxane q2 weeks, tolerating well except fatigue. He feels down the first week, then recovers. Able to carry on at home and golf on occasion. They are going to the beach later this month. Had an episode of what he thinks was food poisoning. About 2 hours after eating leftover chicken he developed fever/chills and vomiting. The episode lasted a couple hours and has not recurred. He saw what looked like a "BB" size amount of blood in the emesis. Otherwise no n/v. Denies  new/worsening abd pain. Takes tramadol 2 tabs BID which is helpful.   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 tobb;  newly dx ED 10-31-2022 w/ RVR  in setting flank pain (kidney stone)  --  office note in epic 11-07-2022 normal ETT 11-13-2022, echo 11-07-2022 ef 65-70% w/ mild LVH, mild MR,  zio monitor not completed,  started on toprol and xarelto daily   Personal history of chemotherapy    Receiving chemotherapy for metastatic pancreatic cancer. Most recent infusion (as of 12/02/22) was on 11/15/22.   Port-A-Cath in place 08/10/2021   Type 2 diabetes mellitus treated with insulin [facility])    endocrinologist--- dr Lucianne Muss   Vitamin D deficiency    takes Vit D supplements   Wears contact lenses      Past Surgical History:  Procedure Laterality Date   APPENDECTOMY  1988   BIOPSY  07/26/2021   Procedure: BIOPSY;  Surgeon: Lemar Lofty., MD;  Location: [facility] ENDOSCOPY;  Service: Gastroenterology;;   COLONOSCOPY WITH PROPOFOL  N/A 07/26/2021   Procedure: COLONOSCOPY WITH PROPOFOL;  Surgeon: Meridee Score Netty Starring., MD;  Location: [facility] ENDOSCOPY;  Service: Gastroenterology;  Laterality: N/A;   CYSTOSCOPY WITH STENT PLACEMENT Left 10/31/2022   Procedure: CYSTOSCOPY WITH STENT PLACEMENT;  Surgeon: Belva Agee, MD;  Location: [facility] ORS;  Service: Urology;  Laterality: Left;   CYSTOSCOPY/URETEROSCOPY/HOLMIUM LASER/STENT  PLACEMENT Left 12/03/2022   Procedure: CYSTOSCOPY/LEFT URETEROSCOPY/HOLMIUM LASER/STENT EXCHANGE;  Surgeon: Belva Agee, MD;  Location: [facility];  Service: Urology;  Laterality: Left;  1 HR FOR CASE   ESOPHAGOGASTRODUODENOSCOPY (EGD) WITH PROPOFOL N/A 07/26/2021   Procedure: ESOPHAGOGASTRODUODENOSCOPY (EGD) WITH PROPOFOL;  Surgeon: Meridee Score Netty Starring., MD;  Location: [facility] ENDOSCOPY;  Service: Gastroenterology;  Laterality: N/A;   EUS N/A 07/26/2021   Procedure: UPPER ENDOSCOPIC ULTRASOUND (EUS) RADIAL;  Surgeon: Lemar Lofty., MD;  Location: [facility] ENDOSCOPY;  Service: Gastroenterology;  Laterality: N/A;   FINE NEEDLE ASPIRATION  07/26/2021   Procedure: FINE NEEDLE ASPIRATION (FNA) LINEAR;  Surgeon: Lemar Lofty., MD;  Location: [facility] ENDOSCOPY;  Service: Gastroenterology;;   IR IMAGING GUIDED PORT INSERTION  08/10/2021   POLYPECTOMY  07/26/2021   Procedure: POLYPECTOMY;  Surgeon: Lemar Lofty., MD;  Location: [facility] ENDOSCOPY;  Service: Gastroenterology;;   SHOULDER SURGERY Right    as a teenager     Outpatient Encounter Medications as of 06/26/2023  Medication Sig   ALPRAZolam (XANAX) 0.5 MG tablet Take 1 tablet (0.5 mg total) by mouth 3 (three) times daily as needed for anxiety.   Cholecalciferol (VITAMIN D3) 125 MCG (5000 UT) CAPS Take 5,000 Units by mouth in the morning and at bedtime.   Continuous Blood Gluc Sensor (DEXCOM G7 SENSOR) MISC 1 Device by Does not apply route as directed. Change sensor every 10 days (Patient taking differently: 1 Device by Does not apply route as directed. Change sensor every 10 days Per patient on 12/02/22, he has not yet received glucose monitor.)   gabapentin (NEURONTIN) 600 MG tablet Take 1 tablet (600 mg total) by mouth 3 (three) times daily as needed (pain).   glucose blood test strip Use as instructed   hyoscyamine (LEVSIN SL) 0.125 MG SL tablet DISSOLVE ONE TABLET UNDER THE TONGUE 4 TIMES DAILY UP TO EVERY 4  HOURS AS NEEDED FOR NAUSEA, BLOATING, CRAMPING, OR DIARRHEA (Patient not taking: Reported on 12/02/2022)   insulin degludec (TRESIBA FLEXTOUCH) 100 UNIT/ML FlexTouch Pen INJECT 32 UNITS INTO THE SKIN DAILY.   insulin lispro (HUMALOG KWIKPEN) 100 UNIT/ML KwikPen 10 TO 15 UNITS BEFORE MEALS AS DIRECTED   metoprolol succinate (TOPROL XL) 25 MG 24 hr tablet Take 0.5 tablets (12.5 mg total) by mouth daily. (Patient taking differently: Take 12.5 mg by mouth daily. Per patient, he will start taking Metoprolol on 12/02/22 and take again on the day of surgery (12/03/22.))   mirtazapine (REMERON) 7.5 MG tablet Take 1 tablet (7.5 mg total) by mouth at bedtime.   potassium chloride (KLOR-CON M) 10 MEQ tablet Take 1 tablet (10 mEq total) by mouth 2 (two) times daily.   prochlorperazine (COMPAZINE) 10 MG tablet Take 1 tablet (10 mg total) by mouth every 6 (six) hours as needed (Nausea or vomiting).   rivaroxaban (XARELTO) 20 MG TABS tablet Take 1 tablet (20 mg total) by mouth daily with supper. (Patient taking differently: Take 20 mg by mouth daily with supper. Patient stated that he last took Xarelto on 11/30/22.)   silver sulfADIAZINE (SILVADENE) 1 % cream Apply 1 Application topically daily.   traMADol Janean Sark)  50 MG tablet Take 1-2 tablets (50-100 mg total) by mouth every 6 (six) hours as needed.   zolpidem (AMBIEN) 10 MG tablet Take 1 tablet (10 mg total) by mouth at bedtime as needed. for sleep   No facility-administered encounter medications on file as of 06/26/2023.     Today's Vitals   06/26/23 1239  BP: 121/88  Pulse: 67  Resp: 13  Temp: 97.7 F (36.5 C)  SpO2: 97%  Weight: 199 lb 12.8 oz (90.6 kg)   Body mass index is 27.87 kg/m.   PHYSICAL EXAM GENERAL:alert, no distress and comfortable SKIN: no rash  EYES: sclera clear LUNGS: clear with normal breathing effort HEART: regular rate & rhythm, no lower extremity edema ABDOMEN: abdomen soft, non-tender and normal bowel sounds NEURO: alert &  oriented x 3 with fluent speech PAC without erythema    CBC    Component Value Date/Time   WBC 10.3 06/26/2023 1119   WBC 5.2 06/05/2021 1130   RBC 3.76 (L) 06/26/2023 1119   HGB 12.7 (L) 06/26/2023 1119   HGB 11.2 (L) 11/07/2022 1649   HCT 36.8 (L) 06/26/2023 1119   HCT 32.3 (L) 11/07/2022 1649   PLT 103 (L) 06/26/2023 1119   PLT 127 (L) 11/07/2022 1649   MCV 97.9 06/26/2023 1119   MCV 101 (H) 11/07/2022 1649   MCH 33.8 06/26/2023 1119   MCHC 34.5 06/26/2023 1119   RDW 13.7 06/26/2023 1119   RDW 13.2 11/07/2022 1649   LYMPHSABS 1.4 06/26/2023 1119   MONOABS 1.1 (H) 06/26/2023 1119   EOSABS 0.1 06/26/2023 1119   BASOSABS 0.0 06/26/2023 1119     CMP     Component Value Date/Time   NA 141 06/26/2023 1119   K 3.9 06/26/2023 1119   CL 108 06/26/2023 1119   CO2 28 06/26/2023 1119   GLUCOSE 139 (H) 06/26/2023 1119   BUN 11 06/26/2023 1119   CREATININE 1.02 06/26/2023 1119   CREATININE 0.88 06/05/2021 1130   CALCIUM 8.8 (L) 06/26/2023 1119   PROT 6.8 06/26/2023 1119   ALBUMIN 3.7 06/26/2023 1119   AST 15 06/26/2023 1119   ALT 15 06/26/2023 1119   ALKPHOS 74 06/26/2023 1119   BILITOT 0.7 06/26/2023 1119   GFRNONAA >60 06/26/2023 1119   GFRNONAA 96 03/05/2021 0915   GFRAA 112 03/05/2021 0915     ASSESSMENT & PLAN:Zachary Reid is a 67 y.o. male with    1. Metastatic pancreatic adenocarcinoma, [id number] with numerous small cavitary lesions in bilateral lungs -found incidentally on recent CT scan for left kidney stone.  He does have some nonspecific symptoms, including epigastric pain, chronic right flank pain, and a dry cough.  He has had significant weight loss in the past several years. -Diagnosed 07/2021, Cytology confirmed adenocarcinoma.  Baseline CA 19-9 elevated to 273 on 07/03/2021 -IHC molecular testing was normal. He is not a candidate for immunotherapy. -He began first-line mFOLFIRINOX on 08/13/2021, required dose reduction and moved to q3 weeks -Restaging CT CAP  08/12/22 showed stable new and enlarging pulmonary metastases, stable pancreatic mass -He switched irinotecan to liposomal q3 weeks on 09/05/2022; stopped due to progression - Changed to Gem/abraxane q2 weeks starting 12/2022, dose modifications for tolerance  -Restaging 02/2023 showed mixed response, CA 19-9 down trending -Mr. Wagenblast appears stable. He continues gem/abraxane q2 weeks, tolerating moderately well with fatigue. Gaining weight. No significant pain. He is able to recover on week off and maintain adequate PS. No clinical evidence of disease progression -last CA 19-9  increased 180 --> 349, however clinically he is doing well. He does not want to repeat CA 19-9 today before trip  -He prefers to skip treatment in 2 weeks for upcoming vacation, then scan on 9/3 as scheduled -F/up in 4 weeks to review scan and next cycle  2. Afib with RVR -found during recent ER visit for kidney stone -Saw cardiologist Dr. Burman Freestone who recommends cardiac monitor and prescribed toprol XL and xarelto. He has not started yet -OK from oncology standpoint to start these meds. We reviewed bleeding risk   3. Genetic Testing -he reports pancreatic cancer in his mother, prostate cancer in his father, ovarian in PGM, and lung in PGF (smoker). -he proceeded with testing on 07/30/21. -Result shows a variant of uncertain significance in the CDKN1B gene -He is BRCA1/2 negative, not a candidate for PARP inhibitor     PLAN: -Labs reviewed -Dose-reduce Gem/abraxane today (for upcoming vacation) -Cancel 8/22 per pt preference, would like month off for vacation and QOL -Scan 9/3 as scheduled -F/up in 4 weeks to review CT and next cycle -Refill Tramadol    All questions were answered. The patient knows to call the clinic with any problems, questions or concerns. No barriers to learning were detected. I spent 20 minutes counseling the patient face to face. The total time spent in the appointment was 30 minutes and  more than 50% was on counseling, review of test results, and coordination of care.   Santiago Glad, NP-C 06/26/2023

## 2023-06-26 NOTE — Patient Instructions (Signed)
[facility]  Discharge Instructions: Thank you for choosing [facility] to provide your oncology and hematology care.   If you have a lab appointment with the Cancer Center, please go directly to the Cancer Center and check in at the registration area.   Wear comfortable clothing and clothing appropriate for easy access to any Portacath or PICC line.   We strive to give you quality time with your provider. You may need to reschedule your appointment if you arrive late (15 or more minutes).  Arriving late affects you and other patients whose appointments are after yours.  Also, if you miss three or more appointments without notifying the office, you may be dismissed from the clinic at the provider's discretion.      For prescription refill requests, have your pharmacy contact our office and allow 72 hours for refills to be completed.    Today you received the following chemotherapy and/or immunotherapy agents: Abraxane and Gemzar      To help prevent nausea and vomiting after your treatment, we encourage you to take your nausea medication as directed.  BELOW ARE SYMPTOMS THAT SHOULD BE REPORTED IMMEDIATELY: *FEVER GREATER THAN 100.4 F (38 C) OR HIGHER *CHILLS OR SWEATING *NAUSEA AND VOMITING THAT IS NOT CONTROLLED WITH YOUR NAUSEA MEDICATION *UNUSUAL SHORTNESS OF BREATH *UNUSUAL BRUISING OR BLEEDING *URINARY PROBLEMS (pain or burning when urinating, or frequent urination) *BOWEL PROBLEMS (unusual diarrhea, constipation, pain near the anus) TENDERNESS IN MOUTH AND THROAT WITH OR WITHOUT PRESENCE OF ULCERS (sore throat, sores in mouth, or a toothache) UNUSUAL RASH, SWELLING OR PAIN  UNUSUAL VAGINAL DISCHARGE OR ITCHING   Items with * indicate a potential emergency and should be followed up as soon as possible or go to the Emergency Department if any problems should occur.  Please show the CHEMOTHERAPY ALERT CARD or IMMUNOTHERAPY ALERT  CARD at check-in to the Emergency Department and triage nurse.  Should you have questions after your visit or need to cancel or reschedule your appointment, please contact [facility]  Dept: [phone]  and follow the prompts.  Office hours are 8:00 a.m. to 4:30 p.m. Monday - Friday. Please note that voicemails left after 4:00 p.m. may not be returned until the following business day.  We are closed weekends and major holidays. You have access to a nurse at all times for urgent questions. Please call the main number to the clinic Dept: [phone] and follow the prompts.   For any non-urgent questions, you may also contact your provider using MyChart. We now offer e-Visits for anyone 18 and older to request care online for non-urgent symptoms. For details visit [email or web address].   Also download the MyChart app! Go to the app store, search "MyChart", open the app, select [facility], and log in with your MyChart username and password.   

## 2023-06-28 ENCOUNTER — Other Ambulatory Visit: Payer: Self-pay

## 2023-07-03 ENCOUNTER — Other Ambulatory Visit: Payer: Self-pay

## 2023-07-06 ENCOUNTER — Other Ambulatory Visit: Payer: Self-pay

## 2023-07-07 ENCOUNTER — Other Ambulatory Visit: Payer: Self-pay | Admitting: Hematology

## 2023-07-07 DIAGNOSIS — C259 Malignant neoplasm of pancreas, unspecified: Secondary | ICD-10-CM

## 2023-07-08 ENCOUNTER — Other Ambulatory Visit: Payer: Self-pay | Admitting: Nurse Practitioner

## 2023-07-08 ENCOUNTER — Encounter: Payer: Self-pay | Admitting: Hematology

## 2023-07-08 DIAGNOSIS — C259 Malignant neoplasm of pancreas, unspecified: Secondary | ICD-10-CM

## 2023-07-08 MED ORDER — GABAPENTIN 600 MG PO TABS: 600.0000 mg | ORAL_TABLET | Freq: Three times a day (TID) | ORAL | 0 refills | Status: DC | PRN

## 2023-07-10 ENCOUNTER — Inpatient Hospital Stay: Payer: Medicare Other

## 2023-07-10 ENCOUNTER — Ambulatory Visit: Payer: Medicare Other

## 2023-07-10 ENCOUNTER — Other Ambulatory Visit: Payer: Medicare Other

## 2023-07-10 ENCOUNTER — Ambulatory Visit: Payer: Medicare Other | Admitting: Hematology

## 2023-07-10 ENCOUNTER — Inpatient Hospital Stay: Payer: Medicare Other | Admitting: Hematology

## 2023-07-22 ENCOUNTER — Ambulatory Visit (HOSPITAL_COMMUNITY)
Admission: RE | Admit: 2023-07-22 | Discharge: 2023-07-22 | Disposition: A | Discharge status: Home / Self Care | Payer: Medicare Other | Source: Ambulatory Visit | Attending: Hematology | Admitting: Hematology

## 2023-07-22 DIAGNOSIS — C259 Malignant neoplasm of pancreas, unspecified: Secondary | ICD-10-CM | POA: Diagnosis present

## 2023-07-22 MED ORDER — HEPARIN SOD (PORK) LOCK FLUSH 100 UNIT/ML IV SOLN
500.0000 [IU] | Freq: Once | INTRAVENOUS | Status: AC
  Administered 2023-07-22: 500 [IU] via INTRAVENOUS

## 2023-07-22 MED ORDER — IOHEXOL 300 MG/ML  SOLN
100.0000 mL | Freq: Once | INTRAMUSCULAR | Status: AC | PRN
  Administered 2023-07-22: 100 mL via INTRAVENOUS

## 2023-07-22 MED ORDER — SODIUM CHLORIDE (PF) 0.9 % IJ SOLN
INTRAMUSCULAR | Status: AC
  Filled 2023-07-22: qty 50

## 2023-07-22 MED ORDER — HEPARIN SOD (PORK) LOCK FLUSH 100 UNIT/ML IV SOLN
INTRAVENOUS | Status: AC
  Filled 2023-07-22: qty 5

## 2023-07-23 ENCOUNTER — Other Ambulatory Visit: Payer: Self-pay | Admitting: Nurse Practitioner

## 2023-07-23 DIAGNOSIS — C259 Malignant neoplasm of pancreas, unspecified: Secondary | ICD-10-CM

## 2023-07-24 ENCOUNTER — Other Ambulatory Visit: Payer: Self-pay

## 2023-07-24 ENCOUNTER — Inpatient Hospital Stay: Payer: Medicare Other | Attending: Hematology

## 2023-07-24 ENCOUNTER — Inpatient Hospital Stay: Payer: Medicare Other

## 2023-07-24 ENCOUNTER — Encounter: Payer: Self-pay | Admitting: Hematology

## 2023-07-24 ENCOUNTER — Other Ambulatory Visit: Payer: Medicare Other

## 2023-07-24 ENCOUNTER — Inpatient Hospital Stay (HOSPITAL_BASED_OUTPATIENT_CLINIC_OR_DEPARTMENT_OTHER): Payer: Medicare Other | Attending: Hematology | Admitting: Hematology

## 2023-07-24 ENCOUNTER — Ambulatory Visit: Payer: Medicare Other | Admitting: Hematology

## 2023-07-24 ENCOUNTER — Ambulatory Visit: Payer: Medicare Other

## 2023-07-24 VITALS — BP 135/73 | HR 73 | Temp 98.0°F | Resp 18 | Ht 71.0 in | Wt 199.8 lb

## 2023-07-24 DIAGNOSIS — Z79899 Other long term (current) drug therapy: Secondary | ICD-10-CM | POA: Insufficient documentation

## 2023-07-24 DIAGNOSIS — C7800 Secondary malignant neoplasm of unspecified lung: Secondary | ICD-10-CM | POA: Diagnosis not present

## 2023-07-24 DIAGNOSIS — E785 Hyperlipidemia, unspecified: Secondary | ICD-10-CM | POA: Insufficient documentation

## 2023-07-24 DIAGNOSIS — Z7189 Other specified counseling: Secondary | ICD-10-CM

## 2023-07-24 DIAGNOSIS — N132 Hydronephrosis with renal and ureteral calculous obstruction: Secondary | ICD-10-CM | POA: Diagnosis not present

## 2023-07-24 DIAGNOSIS — I480 Paroxysmal atrial fibrillation: Secondary | ICD-10-CM | POA: Insufficient documentation

## 2023-07-24 DIAGNOSIS — G4700 Insomnia, unspecified: Secondary | ICD-10-CM | POA: Insufficient documentation

## 2023-07-24 DIAGNOSIS — E1122 Type 2 diabetes mellitus with diabetic chronic kidney disease: Secondary | ICD-10-CM

## 2023-07-24 DIAGNOSIS — Z5111 Encounter for antineoplastic chemotherapy: Secondary | ICD-10-CM | POA: Diagnosis present

## 2023-07-24 DIAGNOSIS — C250 Malignant neoplasm of head of pancreas: Secondary | ICD-10-CM | POA: Insufficient documentation

## 2023-07-24 DIAGNOSIS — K589 Irritable bowel syndrome without diarrhea: Secondary | ICD-10-CM | POA: Diagnosis not present

## 2023-07-24 DIAGNOSIS — Z86010 Personal history of colonic polyps: Secondary | ICD-10-CM | POA: Diagnosis not present

## 2023-07-24 DIAGNOSIS — K7689 Other specified diseases of liver: Secondary | ICD-10-CM | POA: Insufficient documentation

## 2023-07-24 DIAGNOSIS — C259 Malignant neoplasm of pancreas, unspecified: Secondary | ICD-10-CM | POA: Diagnosis not present

## 2023-07-24 DIAGNOSIS — R590 Localized enlarged lymph nodes: Secondary | ICD-10-CM | POA: Insufficient documentation

## 2023-07-24 DIAGNOSIS — I700 Atherosclerosis of aorta: Secondary | ICD-10-CM | POA: Diagnosis not present

## 2023-07-24 DIAGNOSIS — I82891 Chronic embolism and thrombosis of other specified veins: Secondary | ICD-10-CM | POA: Diagnosis not present

## 2023-07-24 DIAGNOSIS — E860 Dehydration: Secondary | ICD-10-CM

## 2023-07-24 DIAGNOSIS — I2510 Atherosclerotic heart disease of native coronary artery without angina pectoris: Secondary | ICD-10-CM | POA: Diagnosis not present

## 2023-07-24 DIAGNOSIS — F419 Anxiety disorder, unspecified: Secondary | ICD-10-CM

## 2023-07-24 DIAGNOSIS — E119 Type 2 diabetes mellitus without complications: Secondary | ICD-10-CM | POA: Insufficient documentation

## 2023-07-24 DIAGNOSIS — Z794 Long term (current) use of insulin: Secondary | ICD-10-CM | POA: Insufficient documentation

## 2023-07-24 DIAGNOSIS — Z95828 Presence of other vascular implants and grafts: Secondary | ICD-10-CM

## 2023-07-24 DIAGNOSIS — Z7901 Long term (current) use of anticoagulants: Secondary | ICD-10-CM | POA: Diagnosis not present

## 2023-07-24 DIAGNOSIS — R059 Cough, unspecified: Secondary | ICD-10-CM | POA: Insufficient documentation

## 2023-07-24 DIAGNOSIS — R5383 Other fatigue: Secondary | ICD-10-CM | POA: Diagnosis not present

## 2023-07-24 DIAGNOSIS — E559 Vitamin D deficiency, unspecified: Secondary | ICD-10-CM | POA: Insufficient documentation

## 2023-07-24 LAB — CBC WITH DIFFERENTIAL (CANCER CENTER ONLY)
Abs Immature Granulocytes: 0.01 10*3/uL (ref 0.00–0.07)
Basophils Absolute: 0 10*3/uL (ref 0.0–0.1)
Basophils Relative: 0 %
Eosinophils Absolute: 0.1 10*3/uL (ref 0.0–0.5)
Eosinophils Relative: 1 %
HCT: 36.1 % — ABNORMAL LOW (ref 39.0–52.0)
Hemoglobin: 12.2 g/dL — ABNORMAL LOW (ref 13.0–17.0)
Immature Granulocytes: 0 %
Lymphocytes Relative: 10 %
Lymphs Abs: 0.8 10*3/uL (ref 0.7–4.0)
MCH: 33.6 pg (ref 26.0–34.0)
MCHC: 33.8 g/dL (ref 30.0–36.0)
MCV: 99.4 fL (ref 80.0–100.0)
Monocytes Absolute: 0.6 10*3/uL (ref 0.1–1.0)
Monocytes Relative: 8 %
Neutro Abs: 6.7 10*3/uL (ref 1.7–7.7)
Neutrophils Relative %: 81 %
Platelet Count: 121 10*3/uL — ABNORMAL LOW (ref 150–400)
RBC: 3.63 MIL/uL — ABNORMAL LOW (ref 4.22–5.81)
RDW: 13 % (ref 11.5–15.5)
WBC Count: 8.2 10*3/uL (ref 4.0–10.5)
nRBC: 0 % (ref 0.0–0.2)

## 2023-07-24 LAB — CMP (CANCER CENTER ONLY)
ALT: 15 U/L (ref 0–44)
AST: 17 U/L (ref 15–41)
Albumin: 3.9 g/dL (ref 3.5–5.0)
Alkaline Phosphatase: 94 U/L (ref 38–126)
Anion gap: 6 (ref 5–15)
BUN: 17 mg/dL (ref 8–23)
CO2: 26 mmol/L (ref 22–32)
Calcium: 9 mg/dL (ref 8.9–10.3)
Chloride: 108 mmol/L (ref 98–111)
Creatinine: 0.93 mg/dL (ref 0.61–1.24)
GFR, Estimated: >60 mL/min (ref >60)
Glucose, Bld: 190 mg/dL — ABNORMAL HIGH (ref 70–99)
Potassium: 3.8 mmol/L (ref 3.5–5.1)
Sodium: 140 mmol/L (ref 135–145)
Total Bilirubin: 0.8 mg/dL (ref 0.3–1.2)
Total Protein: 6.8 g/dL (ref 6.5–8.1)

## 2023-07-24 MED ORDER — GABAPENTIN 600 MG PO TABS: 600.0000 mg | ORAL_TABLET | Freq: Three times a day (TID) | ORAL | 0 refills | Status: DC | PRN

## 2023-07-24 MED ORDER — SODIUM CHLORIDE 0.9% FLUSH
10.0000 mL | Freq: Once | INTRAVENOUS | Status: AC
  Administered 2023-07-24: 10 mL

## 2023-07-24 MED ORDER — TRAMADOL HCL 50 MG PO TABS: 50.0000 mg | ORAL_TABLET | Freq: Four times a day (QID) | ORAL | 0 refills | Status: DC | PRN

## 2023-07-24 NOTE — Assessment & Plan Note (Signed)
-  Patient understands his treatment is palliative, to prolong his life. -He has agreed with DNR. 

## 2023-07-24 NOTE — Telephone Encounter (Signed)
He is arrived for appointment with you. Will you fill this for him? Thank you. Herbert Seta

## 2023-07-24 NOTE — Assessment & Plan Note (Signed)
-  He is on Xanax and Ambien as needed -He still has a few nights of insomnia despite Ambien or Xanax after treatment, I previously prescribed lorazepam.  Due to the multiple benzos, I discontinued lorazepam -He also has some signs of depression, I started him on mirtazapine 7.5 mg HS

## 2023-07-24 NOTE — Progress Notes (Signed)
DISCONTINUE ON PATHWAY REGIMEN - Pancreatic Adenocarcinoma     A cycle is every 28 days:     Nab-paclitaxel (protein bound)      Gemcitabine   **Always confirm dose/schedule in your pharmacy ordering system**  REASON: Disease Progression PRIOR TREATMENT: PANOS51: Nab-Paclitaxel (Abraxane) 125 mg/m2 D1, 8, 15 + Gemcitabine 1,000 mg/m2 D1, 8, 15 q28 Days Until Progression or Toxicity TREATMENT RESPONSE: Partial Response (PR)  START ON PATHWAY REGIMEN - Pancreatic Adenocarcinoma     A cycle is every 14 days:     Liposomal irinotecan      Leucovorin      Fluorouracil   **Always confirm dose/schedule in your pharmacy ordering system**  Patient Characteristics: Metastatic Disease, Second Line, MSS/pMMR or MSI Unknown, Gemcitabine-Based Therapy First Line Therapeutic Status: Metastatic Disease Line of Therapy: Second Line Microsatellite/Mismatch Repair Status: MSS/pMMR Intent of Therapy: Non-Curative / Palliative Intent, Discussed with Patient

## 2023-07-24 NOTE — Progress Notes (Signed)
[facility]   [phone] [phone]   Clinic Follow up Note   Patient Care Team: Lucky Cowboy, MD as PCP - General (Internal Medicine) Pollyann Samples, NP as PCP - Hematology/Oncology (Nurse Practitioner) Thomasene Ripple, DO as PCP - Cardiology (Cardiology) Nadara Mustard, MD as Consulting Physician (Orthopedic Surgery) Laurey Morale, MD as Consulting Physician (Cardiology) Malachy Mood, MD as Consulting Physician (Oncology)  Date of Service:  07/24/2023  CHIEF COMPLAINT: f/u of pancreatic cancer   CURRENT THERAPY:   Gemcitabine and Abraxane, day 1, 15 q28d starting 12/2022   ASSESSMENT:  Zachary Reid is a 67 y.o. male with   Pancreatic adenocarcinoma (HCC) [phone] with numerous small cavitary lesions in bilateral lungs, MMR normal  -diagnosed 07/2021, after incidental finding on CT scan for kidney stone, by colonoscopy/EUS. -Baseline CA 19-9 elevated to 273 on 07/03/21 -He began first-line mFOLFIRINOX on 08/13/21, but required dose reduction and move to every 3 weeks. -his CA 19-9 trended up but is now stable. -restaging CT CAP 08/12/22 showed: multiple new and enlarging pulmonary metastases; pancreatic mass grossly stable. Will repeat in 3 months. -we changed his irinotecan to liposomal on 09/05/22. He tolerated better and feels stronger. -Due to disease progression, his treatment was changed to gemcitabine and Abraxane every 2 weeks on 01/02/2023 -He tolerated the first two cycles chemotherapy very well with mild fatigue.  I increased gemcitabine to 1000mg /m2 from cycle 3 -Restaging CT scan 03/12/2023 showed stable disease overall. His primary pancreatic tumor is slightly increased in size, however his multiple cavitary lung nodules are slightly improved with thinner walls now.   -will continue current therapy.  He is overall tolerating well, but he has developed worsening fatigue lately.  I will reduce gemcitabine dose to 800 mg/m again. -His  tumor marker CA 19.9 continues trending down, which is a good clinical sign. -restaging CT 07/22/2023 showed moderate disease progression at the primary site and lung mets, I personally reviewed the scan images with patient and his wife in detail. -I will stop his current chemo, and switch to next line.  I discussed option of FOLFIRI with liposomal irinotecan, FOLFOX, and the clinical trial options.  I recommended him to try liposomal irinotecan and 5-FU pump infusion every 2 weeks first --Chemotherapy consent: Side effects including but does not not limited to, fatigue, nausea, vomiting, diarrhea, hair loss, neuropathy, fluid retention, renal and kidney dysfunction, neutropenic fever, needed for blood transfusion, bleeding, were discussed with patient in great detail. he agrees to proceed. -He understands the goal of chemotherapy is palliative, to prolong his life -He does not have significant fatigue from chemotherapy, I will reduce his chemo dose by 30% -We also discussed clinical trial option, I will reach out to [facility] and [facility] to see if they have options. -Patient and his wife had many questions, I answered all to their satisfaction.  Diabetes (HCC) -f/u with PCP -overall controlled     Goals of care, counseling/discussion -Patient understands his treatment is palliative, to prolong his life. -He has agreed with DNR.    Anxiety -He is on Xanax and Ambien as needed -He still has a few nights of insomnia despite Ambien or Xanax after treatment, I previously prescribed lorazepam.  Due to the multiple benzos, I discontinued lorazepam -He also has some signs of depression, I started him on mirtazapine 7.5 mg HS      PLAN: -reviewed CT scan with pt, which showed disease progression  - I recommend FOLFIRI  Liposomal irinotecan w/ 5 -FU pump - I recommend changing treatment due to progression -recommend Guardant 360 testing for target therapy -Discuss Clinical trial for near  future -I refill Gabapentin -I refill Tramadol - No treatment today, start new chemo next week  -I will see him with cycle 2 chemo   SUMMARY OF ONCOLOGIC HISTORY: Oncology History Overview Note   Cancer Staging  Pancreatic adenocarcinoma [facility]) Staging form: Exocrine Pancreas, AJCC 8th Edition - Clinical stage from 07/26/2021: Stage IV (cT4, cN0, cM1) - Signed by Malachy Mood, MD on 07/28/2021    Pancreatic adenocarcinoma (HCC)  07/02/2021 Initial Diagnosis   Pancreatic adenocarcinoma (HCC)   07/26/2021 Cancer Staging   Staging form: Exocrine Pancreas, AJCC 8th Edition - Clinical stage from 07/26/2021: Stage IV (cT4, cN0, cM1) - Signed by Malachy Mood, MD on 07/28/2021 Stage prefix: Initial diagnosis Total positive nodes: 0   08/13/2021 - 07/06/2022 Chemotherapy   Patient is on Treatment Plan : PANCREAS Modified FOLFIRINOX q14d x 4 cycles      Genetic Testing   [facility] CancerNext-Expanded results (77 genes) were negative. No pathogenic variants were identified. 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 The report date is 08/15/2021.    The CancerNext-Expanded gene panel offered by [facility] and includes sequencing, rearrangement, and RNA analysis for the following 77 genes: AIP, ALK, APC, ATM, AXIN2, BAP1, BARD1, BLM, BMPR1A, BRCA1, BRCA2, BRIP1, CDC73, CDH1, CDK4, CDKN1B, CDKN2A, CHEK2, CTNNA1, DICER1, FANCC, FH, FLCN, GALNT12, KIF1B, LZTR1, MAX, MEN1, MET, MLH1, MSH2, MSH3, MSH6, MUTYH, NBN, NF1, NF2, NTHL1, PALB2, PHOX2B, PMS2, POT1, PRKAR1A, PTCH1, PTEN, RAD51C, RAD51D, RB1, RECQL, RET, SDHA, SDHAF2, SDHB, SDHC, SDHD, SMAD4, SMARCA4, SMARCB1, SMARCE1, STK11, SUFU, TMEM127, TP53, TSC1, TSC2, VHL and XRCC2 (sequencing and deletion/duplication); EGFR, EGLN1, HOXB13, KIT, MITF, PDGFRA, POLD1, and POLE (sequencing only); EPCAM and GREM1 (deletion/duplication only).     08/13/2021 - 12/06/2022 Chemotherapy   Patient is on Treatment Plan : PANCREAS Modified FOLFIRINOX  q14d x 4 cycles     10/25/2021 Imaging   EXAM: CT CHEST, ABDOMEN, AND PELVIS WITH CONTRAST  IMPRESSION: 1. Innumerable bilateral pulmonary nodules, many of which are cavitary, consistent with metastatic disease. These nodules show no substantial change in are minimally progressed in the interval. 2. Mix cystic and solid lesion in the head and body of the pancreas is similar to prior and also comparing back to MRI 07/02/2021. 3. Hepatic cysts. 4. 8 mm nonobstructing left renal stone. 5. Aortic Atherosclerosis (ICD10-I70.0).   01/28/2022 Imaging   EXAM: CT CHEST, ABDOMEN, AND PELVIS WITH CONTRAST  IMPRESSION: 1. Innumerable bilateral small solid and cavitary pulmonary nodules, some of the cavitary nodules are slightly less thick-walled when compared with prior exam. 2. Mixed cystic and solid lesion in the head and body of the pancreas is similar to prior exam. 3. Nonobstructing left renal stone. 4.  Aortic Atherosclerosis (ICD10-I70.0).   11/29/2022 Imaging    IMPRESSION: 1. No significant change in primary pancreatic mass and adjacent cystic component. Unchanged appearance of soft tissue extending to involve the adjacent celiac axis, superior mesenteric artery origin, and portal confluence. 2. 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 3. Innumerable small pulmonary nodules throughout the lungs, the majority of which are cavitary. Although interval change is difficult to appreciate for the majority of these nodules due to small size, at least some of these are slightly enlarged, consistent with worsened pulmonary metastatic disease. 4. Newly enlarged left  retroperitoneal lymph nodes, which may reflect nodal metastatic disease or perhaps reactive to left hydronephrosis. Attention on follow-up. 5. A sizable calculus previously seen in the left renal pelvis has migrated to the middle third of the left  ureter, with placement of a double-J left ureteral stent, with formed pigtails in the left renal pelvis and urinary bladder. Moderate left hydronephrosis and proximal hydroureter. 6. Coronary artery disease.   Aortic Atherosclerosis (ICD10-I70.0).   01/02/2023 - 06/26/2023 Chemotherapy   Patient is on Treatment Plan : PANCREATIC Abraxane D1,8,15 + Gemcitabine D1,8,15 q28d     03/12/2023 Imaging    IMPRESSION: 1. Primary pancreatic mass with adjacent cystic component is slightly increased in size when compared with the prior exam. Similar soft tissue extending involve the celiac axis superior mesenteric artery origin and portal confluence. 2. Innumerable bilateral pulmonary nodules, some of which are cavitary, unchanged when compared with the prior. 3. Left retroperitoneal lymph nodes are decreased in size, likely resolving reactive adenopathy 4. Interval removal of left ureter stent.  No hydronephrosis. 5. coronary artery disease and aortic Atherosclerosis (ICD10-I70.0).   07/22/2023 Imaging   CT CAP W CONTRAST   IMPRESSION: 1. Today's study demonstrates progression of disease as evidenced by enlargement of the primary pancreatic mass, mild enlargement of numerous prominent borderline enlarged and mildly enlarged upper abdominal and retroperitoneal lymph nodes, and increased number and size of numerous metastatic nodules scattered throughout the lungs bilaterally, as detailed above. 2. 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 Main portal vein remains patent at this time. 3. 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 4. Nonobstructive nephrolithiasis in the kidneys bilaterally measuring 2-4 mm in size. 5. Aortic atherosclerosis, in addition to left main and three-vessel coronary artery disease. 6. Additional incidental findings, as above.    07/31/2023 -  Chemotherapy   Patient is on Treatment Plan : PANCREAS Liposomal Irinotecan + Leucovorin + 5-FU IVCI q14d        INTERVAL HISTORY:  Zachary Reid is here for a follow up of pancreatic cancer . He was last seen by NP Lacie on 06/26/2023. He presents to the clinic accompanied by wife. Pt state that the last cycle of treatment went well, but has some fatigue. Pt state he has occasional cough. He reports of pain increase, but is tolerable.      All other systems were reviewed with the patient and are negative.  MEDICAL HISTORY:  Past Medical History:  Diagnosis Date   Adenocarcinoma of pancreas, stage 4 (HCC) 07/2021   oncologist--- dr Mosetta Putt;   mets to  bilateral lung;  started chemo 08-13-2021   Anemia    GAD (generalized anxiety disorder)    History of adenomatous polyp of colon    Hyperlipidemia    Hypogonadism male    IBS (irritable bowel syndrome)    Insomnia    Left ureteral calculus    Metastatic cancer to lung (HCC) 07/2021   primary pancreatic cancer w/ numerous small cavity lesions bilateral lungs   PAF (paroxysmal atrial fibrillation) (HCC) 10/31/2022   cardiologist-- dr Kirtland Bouchard. tobb;  newly dx ED 10-31-2022 w/ RVR  in setting flank pain (kidney stone)  --  office note in epic 11-07-2022 normal ETT 11-13-2022, echo 11-07-2022 ef 65-70% w/ mild LVH, mild MR,  zio monitor not completed,  started on toprol and xarelto daily   Personal history of chemotherapy    Receiving chemotherapy for metastatic pancreatic  cancer. Most recent infusion (as of 12/02/22) was on 11/15/22.   Port-A-Cath in place 08/10/2021   Type 2 diabetes mellitus treated with insulin [facility])    endocrinologist--- dr Lucianne Muss   Vitamin D deficiency    takes Vit D supplements   Wears contact lenses     SURGICAL HISTORY: Past Surgical History:  Procedure Laterality Date   APPENDECTOMY  1988   BIOPSY  07/26/2021   Procedure: BIOPSY;  Surgeon: Lemar Lofty., MD;  Location: [facility] ENDOSCOPY;   Service: Gastroenterology;;   COLONOSCOPY WITH PROPOFOL N/A 07/26/2021   Procedure: COLONOSCOPY WITH PROPOFOL;  Surgeon: Lemar Lofty., MD;  Location: [facility] ENDOSCOPY;  Service: Gastroenterology;  Laterality: N/A;   CYSTOSCOPY WITH STENT PLACEMENT Left 10/31/2022   Procedure: CYSTOSCOPY WITH STENT PLACEMENT;  Surgeon: Belva Agee, MD;  Location: [facility] ORS;  Service: Urology;  Laterality: Left;   CYSTOSCOPY/URETEROSCOPY/HOLMIUM LASER/STENT PLACEMENT Left 12/03/2022   Procedure: CYSTOSCOPY/LEFT URETEROSCOPY/HOLMIUM LASER/STENT EXCHANGE;  Surgeon: Belva Agee, MD;  Location: [facility];  Service: Urology;  Laterality: Left;  1 HR FOR CASE   ESOPHAGOGASTRODUODENOSCOPY (EGD) WITH PROPOFOL N/A 07/26/2021   Procedure: ESOPHAGOGASTRODUODENOSCOPY (EGD) WITH PROPOFOL;  Surgeon: Meridee Score Netty Starring., MD;  Location: [facility] ENDOSCOPY;  Service: Gastroenterology;  Laterality: N/A;   EUS N/A 07/26/2021   Procedure: UPPER ENDOSCOPIC ULTRASOUND (EUS) RADIAL;  Surgeon: Lemar Lofty., MD;  Location: [facility] ENDOSCOPY;  Service: Gastroenterology;  Laterality: N/A;   FINE NEEDLE ASPIRATION  07/26/2021   Procedure: FINE NEEDLE ASPIRATION (FNA) LINEAR;  Surgeon: Meridee Score Netty Starring., MD;  Location: [facility] ENDOSCOPY;  Service: Gastroenterology;;   IR IMAGING GUIDED PORT INSERTION  08/10/2021   POLYPECTOMY  07/26/2021   Procedure: POLYPECTOMY;  Surgeon: Meridee Score Netty Starring., MD;  Location: [facility] ENDOSCOPY;  Service: Gastroenterology;;   SHOULDER SURGERY Right    as a teenager    I have reviewed the social history and family history with the patient and they are unchanged from previous note.  ALLERGIES:  is allergic to lipitor [atorvastatin].  MEDICATIONS:  Current Outpatient Medications  Medication Sig Dispense Refill   ALPRAZolam (XANAX) 0.5 MG tablet Take 1 tablet (0.5 mg total) by mouth 3 (three) times daily as needed for anxiety. 60 tablet 0   Cholecalciferol (VITAMIN D3) 125  MCG (5000 UT) CAPS Take 5,000 Units by mouth in the morning and at bedtime.     Continuous Blood Gluc Sensor (DEXCOM G7 SENSOR) MISC 1 Device by Does not apply route as directed. Change sensor every 10 days (Patient taking differently: 1 Device by Does not apply route as directed. Change sensor every 10 days Per patient on 12/02/22, he has not yet received glucose monitor.) 3 each 3   gabapentin (NEURONTIN) 600 MG tablet Take 1 tablet (600 mg total) by mouth 3 (three) times daily as needed (pain). 90 tablet 0   glucose blood test strip Use as instructed 100 each 12   hyoscyamine (LEVSIN SL) 0.125 MG SL tablet DISSOLVE ONE TABLET UNDER THE TONGUE 4 TIMES DAILY UP TO EVERY 4 HOURS AS NEEDED FOR NAUSEA, BLOATING, CRAMPING, OR DIARRHEA (Patient not taking: Reported on 12/02/2022) 100 tablet 0   insulin degludec (TRESIBA FLEXTOUCH) 100 UNIT/ML FlexTouch Pen INJECT 32 UNITS INTO THE SKIN DAILY. 15 mL 1   insulin lispro (HUMALOG KWIKPEN) 100 UNIT/ML KwikPen 10 TO 15 UNITS BEFORE MEALS AS DIRECTED 15 mL 0   metoprolol succinate (TOPROL XL) 25 MG 24 hr tablet Take 0.5 tablets (12.5 mg total) by mouth daily. (  Patient taking differently: Take 12.5 mg by mouth daily. Per patient, he will start taking Metoprolol on 12/02/22 and take again on the day of surgery (12/03/22.)) 45 tablet 3   mirtazapine (REMERON) 7.5 MG tablet Take 1 tablet (7.5 mg total) by mouth at bedtime. 90 tablet 1   potassium chloride (KLOR-CON M) 10 MEQ tablet Take 1 tablet (10 mEq total) by mouth 2 (two) times daily. 14 tablet 0   prochlorperazine (COMPAZINE) 10 MG tablet Take 1 tablet (10 mg total) by mouth every 6 (six) hours as needed (Nausea or vomiting). 60 tablet 1   rivaroxaban (XARELTO) 20 MG TABS tablet Take 1 tablet (20 mg total) by mouth daily with supper. (Patient taking differently: Take 20 mg by mouth daily with supper. Patient stated that he last took Xarelto on 11/30/22.) 90 tablet 3   silver sulfADIAZINE (SILVADENE) 1 % cream Apply 1  Application topically daily. 20 g 0   traMADol (ULTRAM) 50 MG tablet Take 1-2 tablets (50-100 mg total) by mouth every 6 (six) hours as needed. 120 tablet 0   zolpidem (AMBIEN) 10 MG tablet TAKE 1 TABLET (10 MG TOTAL) BY MOUTH AT BEDTIME AS NEEDED. FOR SLEEP 30 tablet 0   No current facility-administered medications for this visit.    PHYSICAL EXAMINATION: ECOG PERFORMANCE STATUS: 2 - Symptomatic, <50% confined to bed  Vitals:   07/24/23 1018  BP: 135/73  Pulse: 73  Resp: 18  Temp: 98 F (36.7 C)  SpO2: 97%   Wt Readings from Last 3 Encounters:  07/24/23 199 lb 12.8 oz (90.6 kg)  06/26/23 199 lb 12.8 oz (90.6 kg)  06/13/23 196 lb 6.4 oz (89.1 kg)     GENERAL:alert, no distress and comfortable SKIN: skin color normal, no rashes or significant lesions EYES: normal, Conjunctiva are pink and non-injected, sclera clear  NEURO: alert & oriented x 3 with fluent speech LABORATORY DATA:  I have reviewed the data as listed    Latest Ref Rng & Units 07/24/2023    9:51 AM 06/26/2023   11:19 AM 06/13/2023    8:53 AM  CBC  WBC 4.0 - 10.5 K/uL 8.2  10.3  7.3   Hemoglobin 13.0 - 17.0 g/dL 16.1  09.6  04.5   Hematocrit 39.0 - 52.0 % 36.1  36.8  40.4   Platelets 150 - 400 K/uL 121  103  130         Latest Ref Rng & Units 07/24/2023    9:51 AM 06/26/2023   11:19 AM 06/13/2023    8:53 AM  CMP  Glucose 70 - 99 mg/dL 409  811  914   BUN 8 - 23 mg/dL 17  11  19    Creatinine 0.61 - 1.24 mg/dL 7.82  9.56  2.13   Sodium 135 - 145 mmol/L 140  141  141   Potassium 3.5 - 5.1 mmol/L 3.8  3.9  4.3   Chloride 98 - 111 mmol/L 108  108  110   CO2 22 - 32 mmol/L 26  28  25    Calcium 8.9 - 10.3 mg/dL 9.0  8.8  9.0   Total Protein 6.5 - 8.1 g/dL 6.8  6.8  6.5   Total Bilirubin 0.3 - 1.2 mg/dL 0.8  0.7  0.5   Alkaline Phos 38 - 126 U/L 94  74  86   AST 15 - 41 U/L 17  15  19    ALT 0 - 44 U/L 15  15  19  RADIOGRAPHIC STUDIES: I have personally reviewed the radiological images as listed and  agreed with the findings in the report. No results found.    Orders Placed This Encounter  Procedures   Consent Attestation for Oncology Treatment    Order Specific Question:   The patient is informed of risks, benefits, side-effects of the prescribed oncology treatment. Potential short term and long term side effects and response rates discussed. After a long discussion, the patient made informed decision to proceed.    Answer:   Yes   Guardant 360    Standing Status:   Future    Standing Expiration Date:   07/23/2024   CBC with Differential (Cancer Center Only)    Standing Status:   Future    Standing Expiration Date:   07/30/2024   CMP (Cancer Center only)    Standing Status:   Future    Standing Expiration Date:   07/30/2024   CBC with Differential (Cancer Center Only)    Standing Status:   Future    Standing Expiration Date:   08/13/2024   CMP (Cancer Center only)    Standing Status:   Future    Standing Expiration Date:   08/13/2024   CBC with Differential (Cancer Center Only)    Standing Status:   Future    Standing Expiration Date:   08/27/2024   CMP (Cancer Center only)    Standing Status:   Future    Standing Expiration Date:   08/27/2024   All questions were answered. The patient knows to call the clinic with any problems, questions or concerns. No barriers to learning was detected. The total time spent in the appointment was 55 minutes.     Malachy Mood, MD 07/24/2023   Carolin Coy, CMA, am acting as scribe for Malachy Mood, MD.   I have reviewed the above documentation for accuracy and completeness, and I agree with the above.

## 2023-07-24 NOTE — Assessment & Plan Note (Signed)
-  f/u with PCP -overall controlled

## 2023-07-24 NOTE — Assessment & Plan Note (Addendum)
[id number] with numerous small cavitary lesions in bilateral lungs, MMR normal  -diagnosed 07/2021, after incidental finding on CT scan for kidney stone, by colonoscopy/EUS. -Baseline CA 19-9 elevated to 273 on 07/03/21 -He began first-line mFOLFIRINOX on 08/13/21, but required dose reduction and move to every 3 weeks. -his CA 19-9 trended up but is now stable. -restaging CT CAP 08/12/22 showed: multiple new and enlarging pulmonary metastases; pancreatic mass grossly stable. Will repeat in 3 months. -we changed his irinotecan to liposomal on 09/05/22. He tolerated better and feels stronger. -Due to disease progression, his treatment was changed to gemcitabine and Abraxane every 2 weeks on 01/02/2023 -He tolerated the first two cycles chemotherapy very well with mild fatigue.  I increased gemcitabine to 1000mg /m2 from cycle 3 -Restaging CT scan 03/12/2023 showed stable disease overall. His primary pancreatic tumor is slightly increased in size, however his multiple cavitary lung nodules are slightly improved with thinner walls now.   -will continue current therapy.  He is overall tolerating well, but he has developed worsening fatigue lately.  I will reduce gemcitabine dose to 800 mg/m again. -His tumor marker CA 19.9 continues trending down, which is a good clinical sign. -restaging CT 07/22/2023 showed moderate disease progression at the primary site and lung mets, I personally reviewed the scan images with patient and his wife in detail. -I will stop his current chemo, and switch to next line.  I discussed option of FOLFIRI with liposomal irinotecan, FOLFOX, and the clinical trial options.  I recommended him to try liposomal irinotecan and 5-FU pump infusion every 2 weeks first --Chemotherapy consent: Side effects including but does not not limited to, fatigue, nausea, vomiting, diarrhea, hair loss, neuropathy, fluid retention, renal and kidney dysfunction, neutropenic fever, needed for blood transfusion,  bleeding, were discussed with patient in great detail. he agrees to proceed. -He understands the goal of chemotherapy is palliative, to prolong his life -He does not have significant fatigue from chemotherapy, I will reduce his chemo dose by 30% -We also discussed clinical trial option, I will reach out to [facility] and [facility] to see if they have options. -Patient and his wife had many questions, I answered all to their satisfaction.

## 2023-07-25 ENCOUNTER — Other Ambulatory Visit: Payer: Self-pay

## 2023-07-25 LAB — CANCER ANTIGEN 19-9: CA 19-9: 389 U/mL — ABNORMAL HIGH (ref 0–35)

## 2023-08-01 ENCOUNTER — Other Ambulatory Visit: Payer: Self-pay

## 2023-08-06 MED FILL — Dexamethasone Sodium Phosphate Inj 100 MG/10ML: INTRAMUSCULAR | Qty: 1 | Status: AC

## 2023-08-06 NOTE — Progress Notes (Unsigned)
Patient Care Team: Lucky Cowboy, MD as PCP - General (Internal Medicine) Pollyann Samples, NP as PCP - Hematology/Oncology (Nurse Practitioner) Thomasene Ripple, DO as PCP - Cardiology (Cardiology) Nadara Mustard, MD as Consulting Physician (Orthopedic Surgery) Laurey Morale, MD as Consulting Physician (Cardiology) Malachy Mood, MD as Consulting Physician (Oncology)   CHIEF COMPLAINT: Follow up metastatic pancreatic cancer   Oncology History Overview Note   Cancer Staging  Pancreatic adenocarcinoma [facility]) Staging form: Exocrine Pancreas, AJCC 8th Edition - Clinical stage from 07/26/2021: Stage IV (cT4, cN0, cM1) - Signed by Malachy Mood, MD on 07/28/2021    Pancreatic adenocarcinoma (HCC)  07/02/2021 Initial Diagnosis   Pancreatic adenocarcinoma (HCC)   07/26/2021 Cancer Staging   Staging form: Exocrine Pancreas, AJCC 8th Edition - Clinical stage from 07/26/2021: Stage IV (cT4, cN0, cM1) - Signed by Malachy Mood, MD on 07/28/2021 Stage prefix: Initial diagnosis Total positive nodes: 0   08/13/2021 - 07/06/2022 Chemotherapy   Patient is on Treatment Plan : PANCREAS Modified FOLFIRINOX q14d x 4 cycles      Genetic Testing   [facility] CancerNext-Expanded results (77 genes) were negative. No pathogenic variants were identified. 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 The report date is 08/15/2021.    The CancerNext-Expanded gene panel offered by [facility] and includes sequencing, rearrangement, and RNA analysis for the following 77 genes: AIP, ALK, APC, ATM, AXIN2, BAP1, BARD1, BLM, BMPR1A, BRCA1, BRCA2, BRIP1, CDC73, CDH1, CDK4, CDKN1B, CDKN2A, CHEK2, CTNNA1, DICER1, FANCC, FH, FLCN, GALNT12, KIF1B, LZTR1, MAX, MEN1, MET, MLH1, MSH2, MSH3, MSH6, MUTYH, NBN, NF1, NF2, NTHL1, PALB2, PHOX2B, PMS2, POT1, PRKAR1A, PTCH1, PTEN, RAD51C, RAD51D, RB1, RECQL, RET, SDHA, SDHAF2, SDHB, SDHC, SDHD, SMAD4, SMARCA4, SMARCB1, SMARCE1, STK11, SUFU, TMEM127, TP53, TSC1, TSC2, VHL  and XRCC2 (sequencing and deletion/duplication); EGFR, EGLN1, HOXB13, KIT, MITF, PDGFRA, POLD1, and POLE (sequencing only); EPCAM and GREM1 (deletion/duplication only).     08/13/2021 - 12/06/2022 Chemotherapy   Patient is on Treatment Plan : PANCREAS Modified FOLFIRINOX q14d x 4 cycles     10/25/2021 Imaging   EXAM: CT CHEST, ABDOMEN, AND PELVIS WITH CONTRAST  IMPRESSION: 1. Innumerable bilateral pulmonary nodules, many of which are cavitary, consistent with metastatic disease. These nodules show no substantial change in are minimally progressed in the interval. 2. Mix cystic and solid lesion in the head and body of the pancreas is similar to prior and also comparing back to MRI 07/02/2021. 3. Hepatic cysts. 4. 8 mm nonobstructing left renal stone. 5. Aortic Atherosclerosis (ICD10-I70.0).   01/28/2022 Imaging   EXAM: CT CHEST, ABDOMEN, AND PELVIS WITH CONTRAST  IMPRESSION: 1. Innumerable bilateral small solid and cavitary pulmonary nodules, some of the cavitary nodules are slightly less thick-walled when compared with prior exam. 2. Mixed cystic and solid lesion in the head and body of the pancreas is similar to prior exam. 3. Nonobstructing left renal stone. 4.  Aortic Atherosclerosis (ICD10-I70.0).   11/29/2022 Imaging    IMPRESSION: 1. No significant change in primary pancreatic mass and adjacent cystic component. Unchanged appearance of soft tissue extending to involve the adjacent celiac axis, superior mesenteric artery origin, and portal confluence. 2. 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 3. Innumerable small pulmonary nodules throughout the lungs, the majority of which are cavitary. Although interval change is difficult to appreciate for the majority of these nodules due to small size, at least some of these are slightly enlarged, consistent  with worsened pulmonary metastatic disease. 4.  Newly enlarged left retroperitoneal lymph nodes, which may reflect nodal metastatic disease or perhaps reactive to left hydronephrosis. Attention on follow-up. 5. A sizable calculus previously seen in the left renal pelvis has migrated to the middle third of the left ureter, with placement of a double-J left ureteral stent, with formed pigtails in the left renal pelvis and urinary bladder. Moderate left hydronephrosis and proximal hydroureter. 6. Coronary artery disease.   Aortic Atherosclerosis (ICD10-I70.0).   01/02/2023 - 06/26/2023 Chemotherapy   Patient is on Treatment Plan : PANCREATIC Abraxane D1,8,15 + Gemcitabine D1,8,15 q28d     03/12/2023 Imaging    IMPRESSION: 1. Primary pancreatic mass with adjacent cystic component is slightly increased in size when compared with the prior exam. Similar soft tissue extending involve the celiac axis superior mesenteric artery origin and portal confluence. 2. Innumerable bilateral pulmonary nodules, some of which are cavitary, unchanged when compared with the prior. 3. Left retroperitoneal lymph nodes are decreased in size, likely resolving reactive adenopathy 4. Interval removal of left ureter stent.  No hydronephrosis. 5. coronary artery disease and aortic Atherosclerosis (ICD10-I70.0).   07/22/2023 Imaging   CT CAP W CONTRAST   IMPRESSION: 1. Today's study demonstrates progression of disease as evidenced by enlargement of the primary pancreatic mass, mild enlargement of numerous prominent borderline enlarged and mildly enlarged upper abdominal and retroperitoneal lymph nodes, and increased number and size of numerous metastatic nodules scattered throughout the lungs bilaterally, as detailed above. 2. 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 Main portal vein remains patent at this time. 3. 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 4. Nonobstructive nephrolithiasis in the kidneys bilaterally measuring 2-4 mm in size. 5. Aortic atherosclerosis, in addition to left main and three-vessel coronary artery disease. 6. Additional incidental findings, as above.   08/07/2023 -  Chemotherapy   Patient is on Treatment Plan : PANCREAS Liposomal Irinotecan + Leucovorin + 5-FU IVCI q14d        CURRENT THERAPY: PENDING next line FOLFIRI with liposomal irinotecan q14 days, starting 08/07/23  INTERVAL HISTORY Mr. Zachary Reid returns for follow up and to start next line chemo. He had 6 week chemo break, feels better but fatigue did not resolve has he hoped it would, now mild to severe. He has had more RUQ to flank pain, taking tramadol TID and has needed oxy a few times lately. Pain is similar to kidney stone he had at diagnosis. Denies urinary symptoms. Cough is more present, with mucus can be clear, yellow, green. Denies fever, chills, chest pain, dyspnea, leg edema. He stopped xarelto a while ago after being told not to take aspirin on chemo.   ROS  All other systems reviewed and negative  Past Medical History:  Diagnosis Date   Adenocarcinoma of pancreas, stage 4 (HCC) 07/2021   oncologist--- dr Mosetta Putt;   mets to  bilateral lung;  started chemo 08-13-2021   Anemia    GAD (generalized anxiety disorder)    History of adenomatous polyp of colon    Hyperlipidemia    Hypogonadism male    IBS (irritable bowel syndrome)    Insomnia    Left ureteral calculus    Metastatic cancer to lung (HCC) 07/2021   primary pancreatic cancer w/ numerous small cavity lesions bilateral lungs   PAF (paroxysmal atrial fibrillation) (HCC) 10/31/2022   cardiologist-- dr Antonieta Iba;  newly dx ED  10-31-2022 w/ RVR  in setting flank pain (kidney stone)  --  office note in epic 11-07-2022 normal ETT 11-13-2022, echo 11-07-2022 ef 65-70% w/ mild LVH, mild MR,  zio monitor not completed,  started on toprol and xarelto daily    Personal history of chemotherapy    Receiving chemotherapy for metastatic pancreatic cancer. Most recent infusion (as of 12/02/22) was on 11/15/22.   Port-A-Cath in place 08/10/2021   Type 2 diabetes mellitus treated with insulin [facility])    endocrinologist--- dr Lucianne Muss   Vitamin D deficiency    takes Vit D supplements   Wears contact lenses      Past Surgical History:  Procedure Laterality Date   APPENDECTOMY  1988   BIOPSY  07/26/2021   Procedure: BIOPSY;  Surgeon: Lemar Lofty., MD;  Location: [facility] ENDOSCOPY;  Service: Gastroenterology;;   COLONOSCOPY WITH PROPOFOL N/A 07/26/2021   Procedure: COLONOSCOPY WITH PROPOFOL;  Surgeon: Lemar Lofty., MD;  Location: [facility] ENDOSCOPY;  Service: Gastroenterology;  Laterality: N/A;   CYSTOSCOPY WITH STENT PLACEMENT Left 10/31/2022   Procedure: CYSTOSCOPY WITH STENT PLACEMENT;  Surgeon: Belva Agee, MD;  Location: [facility] ORS;  Service: Urology;  Laterality: Left;   CYSTOSCOPY/URETEROSCOPY/HOLMIUM LASER/STENT PLACEMENT Left 12/03/2022   Procedure: CYSTOSCOPY/LEFT URETEROSCOPY/HOLMIUM LASER/STENT EXCHANGE;  Surgeon: Belva Agee, MD;  Location: [facility];  Service: Urology;  Laterality: Left;  1 HR FOR CASE   ESOPHAGOGASTRODUODENOSCOPY (EGD) WITH PROPOFOL N/A 07/26/2021   Procedure: ESOPHAGOGASTRODUODENOSCOPY (EGD) WITH PROPOFOL;  Surgeon: Meridee Score Netty Starring., MD;  Location: [facility] ENDOSCOPY;  Service: Gastroenterology;  Laterality: N/A;   EUS N/A 07/26/2021   Procedure: UPPER ENDOSCOPIC ULTRASOUND (EUS) RADIAL;  Surgeon: Lemar Lofty., MD;  Location: [facility] ENDOSCOPY;  Service: Gastroenterology;  Laterality: N/A;   FINE NEEDLE ASPIRATION  07/26/2021   Procedure: FINE NEEDLE ASPIRATION (FNA) LINEAR;  Surgeon: Lemar Lofty., MD;  Location: [facility] ENDOSCOPY;  Service: Gastroenterology;;   IR IMAGING GUIDED PORT INSERTION  08/10/2021   POLYPECTOMY  07/26/2021   Procedure: POLYPECTOMY;  Surgeon: Meridee Score  Netty Starring., MD;  Location: [facility] ENDOSCOPY;  Service: Gastroenterology;;   SHOULDER SURGERY Right    as a teenager     Outpatient Encounter Medications as of 08/07/2023  Medication Sig   ALPRAZolam (XANAX) 0.5 MG tablet Take 1 tablet (0.5 mg total) by mouth 3 (three) times daily as needed for anxiety.   Cholecalciferol (VITAMIN D3) 125 MCG (5000 UT) CAPS Take 5,000 Units by mouth in the morning and at bedtime.   Continuous Blood Gluc Sensor (DEXCOM G7 SENSOR) MISC 1 Device by Does not apply route as directed. Change sensor every 10 days (Patient taking differently: 1 Device by Does not apply route as directed. Change sensor every 10 days Per patient on 12/02/22, he has not yet received glucose monitor.)   gabapentin (NEURONTIN) 600 MG tablet Take 1 tablet (600 mg total) by mouth 3 (three) times daily as needed (pain).   glucose blood test strip Use as instructed   hyoscyamine (LEVSIN SL) 0.125 MG SL tablet DISSOLVE ONE TABLET UNDER THE TONGUE 4 TIMES DAILY UP TO EVERY 4 HOURS AS NEEDED FOR NAUSEA, BLOATING, CRAMPING, OR DIARRHEA   insulin degludec (TRESIBA FLEXTOUCH) 100 UNIT/ML FlexTouch Pen INJECT 32 UNITS INTO THE SKIN DAILY.   insulin lispro (HUMALOG KWIKPEN) 100 UNIT/ML KwikPen 10 TO 15 UNITS BEFORE MEALS AS DIRECTED   metoprolol succinate (TOPROL XL) 25 MG 24 hr tablet Take 0.5 tablets (12.5 mg total) by mouth daily. (Patient taking  differently: Take 12.5 mg by mouth daily. Per patient, he will start taking Metoprolol on 12/02/22 and take again on the day of surgery (12/03/22.))   mirtazapine (REMERON) 7.5 MG tablet Take 1 tablet (7.5 mg total) by mouth at bedtime.   potassium chloride (KLOR-CON M) 10 MEQ tablet Take 1 tablet (10 mEq total) by mouth 2 (two) times daily.   prochlorperazine (COMPAZINE) 10 MG tablet Take 1 tablet (10 mg total) by mouth every 6 (six) hours as needed (Nausea or vomiting).   silver sulfADIAZINE (SILVADENE) 1 % cream Apply 1 Application topically daily.   traMADol  (ULTRAM) 50 MG tablet Take 1-2 tablets (50-100 mg total) by mouth every 6 (six) hours as needed.   [DISCONTINUED] zolpidem (AMBIEN) 10 MG tablet TAKE 1 TABLET (10 MG TOTAL) BY MOUTH AT BEDTIME AS NEEDED. FOR SLEEP   rivaroxaban (XARELTO) 20 MG TABS tablet Take 1 tablet (20 mg total) by mouth daily with supper. (Patient not taking: Reported on 08/07/2023)   zolpidem (AMBIEN) 10 MG tablet Take 1 tablet (10 mg total) by mouth at bedtime as needed. for sleep   No facility-administered encounter medications on file as of 08/07/2023.     Today's Vitals   08/07/23 0933  BP: 101/74  Pulse: 77  Resp: 16  Temp: 98.1 F (36.7 C)  TempSrc: Oral  SpO2: 100%  Weight: 198 lb 14.4 oz (90.2 kg)  Height: 5\' 11"  (1.803 m)   Body mass index is 27.74 kg/m.   PHYSICAL EXAM GENERAL:alert, no distress and comfortable SKIN: no rash  EYES: sclera clear NECK: without mass LUNGS: clear with normal breathing effort HEART: irregular rhythm, normal rate; no lower extremity edema ABDOMEN: abdomen soft, non-tender and normal bowel sounds NEURO: alert & oriented x 3 with fluent speech, no focal motor deficits PAC without erythema    CBC    Component Value Date/Time   WBC 8.3 08/07/2023 0912   WBC 5.2 06/05/2021 1130   RBC 4.15 (L) 08/07/2023 0912   HGB 13.9 08/07/2023 0912   HGB 11.2 (L) 11/07/2022 1649   HCT 40.8 08/07/2023 0912   HCT 32.3 (L) 11/07/2022 1649   PLT 115 (L) 08/07/2023 0912   PLT 127 (L) 11/07/2022 1649   MCV 98.3 08/07/2023 0912   MCV 101 (H) 11/07/2022 1649   MCH 33.5 08/07/2023 0912   MCHC 34.1 08/07/2023 0912   RDW 12.7 08/07/2023 0912   RDW 13.2 11/07/2022 1649   LYMPHSABS 1.5 08/07/2023 0912   MONOABS 0.7 08/07/2023 0912   EOSABS 0.2 08/07/2023 0912   BASOSABS 0.0 08/07/2023 0912     CMP     Component Value Date/Time   NA 141 08/07/2023 0912   K 3.9 08/07/2023 0912   CL 108 08/07/2023 0912   CO2 28 08/07/2023 0912   GLUCOSE 194 (H) 08/07/2023 0912   BUN 12  08/07/2023 0912   CREATININE 0.99 08/07/2023 0912   CREATININE 0.88 06/05/2021 1130   CALCIUM 9.0 08/07/2023 0912   PROT 7.0 08/07/2023 0912   ALBUMIN 3.8 08/07/2023 0912   AST 19 08/07/2023 0912   ALT 18 08/07/2023 0912   ALKPHOS 92 08/07/2023 0912   BILITOT 0.8 08/07/2023 0912   GFRNONAA >60 08/07/2023 0912   GFRNONAA 96 03/05/2021 0915   GFRAA 112 03/05/2021 0915     ASSESSMENT & PLAN:Lavaughn Smialek is a 67 y.o. male with    1. Metastatic pancreatic adenocarcinoma, [id number] with numerous small cavitary lesions in bilateral lungs -found incidentally on recent CT scan  for left kidney stone.  He does have some nonspecific symptoms, including epigastric pain, chronic right flank pain, and a dry cough.  He has had significant weight loss in the past several years. -Diagnosed 07/2021, Cytology confirmed adenocarcinoma.  Baseline CA 19-9 elevated to 273 on 07/03/2021 -IHC molecular testing was normal. He is not a candidate for immunotherapy. -He began first-line mFOLFIRINOX on 08/13/2021, required dose reduction and moved to q3 weeks -Restaging CT CAP 08/12/22 showed stable new and enlarging pulmonary metastases, stable pancreatic mass -He switched irinotecan to liposomal q3 weeks on 09/05/2022; stopped due to progression - Changed to Gem/abraxane q2 weeks starting 12/2022, dose modifications for tolerance  -Restaging 02/2023 showed mixed response, CA 19-9 down trending -last CA 19-9 increased 180 --> 349 -Restaging CT 07/22/23 showed progression in the primary site and lungs; switched to next line FOLFIRI with liposomal irinotecan starting today -Zachary Reid appears stable. He has increased right flank pain on treatment holiday. Continue tramadol TID and oxy PRN which is effective. I recommend referral to palliative care for pain management and he agrees.  -Otherwise doing well with adequate PS, labs stable. We again reviewed the potential side effect, benefit, symptom management, and palliative  goal of chemo.  -Ready to proceed with C1 liposomal irinotecan/FOLFIRI today as scheduled -F/up in 2 weeks with C2   2. Afib -found during ER visit for kidney stone -Saw cardiologist Dr. Thomasene Ripple, was in NSR with PAC's during exam. She prescribed toprol XL and xarelto. He takes metoprolol but stopped xarelto a while ago  -Afib on EKG today, asymptomatic, I recommend to restart Xarelto due to thrombosis risk with Afib and hypercoagulable state from cancer/chemo. He agrees -CC note to cardiology   3. Genetic Testing -he reports pancreatic cancer in his mother, prostate cancer in his father, ovarian in PGM, and lung in PGF (smoker). -he proceeded with testing on 07/30/21. -Result shows a variant of uncertain significance in the CDKN1B gene -He is BRCA1/2 negative, not a candidate for PARP inhibitor -Guardant 360 drawn today     PLAN: -Labs reviewed, CA 19-9 and Guardant 360 pending -Proceed with C1 liposomal irinotecan FOLFIRI today as scheduled -Reviewed symptom management -EKG shows Afib, restart Xarelto, cc note to cardiology -Referral to palliative care for pain management -Refill Ambien -F/up in 2 weeks with C2   Orders Placed This Encounter  Procedures   Amb Referral to Palliative Care    Referral Priority:   Routine    Referral Type:   Consultation    Referral Reason:   Symptom Managment    Referred to Provider:   Glee Arvin, NP    Number of Visits Requested:   1   EKG 12-Lead      All questions were answered. The patient knows to call the clinic with any problems, questions or concerns. No barriers to learning were detected. I spent 20 minutes counseling the patient face to face. The total time spent in the appointment was 30 minutes and more than 50% was on counseling, review of test results, and coordination of care.   Santiago Glad, NP-C 08/07/2023

## 2023-08-07 ENCOUNTER — Other Ambulatory Visit: Payer: Self-pay

## 2023-08-07 ENCOUNTER — Inpatient Hospital Stay: Payer: Medicare Other

## 2023-08-07 ENCOUNTER — Encounter: Payer: Self-pay | Admitting: Nurse Practitioner

## 2023-08-07 ENCOUNTER — Inpatient Hospital Stay (HOSPITAL_BASED_OUTPATIENT_CLINIC_OR_DEPARTMENT_OTHER): Payer: Medicare Other | Admitting: Nurse Practitioner

## 2023-08-07 VITALS — BP 133/89 | HR 76

## 2023-08-07 VITALS — BP 101/74 | HR 77 | Temp 98.1°F | Resp 16 | Ht 71.0 in | Wt 198.9 lb

## 2023-08-07 DIAGNOSIS — Z5111 Encounter for antineoplastic chemotherapy: Secondary | ICD-10-CM | POA: Diagnosis not present

## 2023-08-07 DIAGNOSIS — Z95828 Presence of other vascular implants and grafts: Secondary | ICD-10-CM

## 2023-08-07 DIAGNOSIS — C259 Malignant neoplasm of pancreas, unspecified: Secondary | ICD-10-CM | POA: Diagnosis not present

## 2023-08-07 DIAGNOSIS — E860 Dehydration: Secondary | ICD-10-CM

## 2023-08-07 LAB — CBC WITH DIFFERENTIAL (CANCER CENTER ONLY)
Abs Immature Granulocytes: 0.03 10*3/uL (ref 0.00–0.07)
Basophils Absolute: 0 10*3/uL (ref 0.0–0.1)
Basophils Relative: 0 %
Eosinophils Absolute: 0.2 10*3/uL (ref 0.0–0.5)
Eosinophils Relative: 2 %
HCT: 40.8 % (ref 39.0–52.0)
Hemoglobin: 13.9 g/dL (ref 13.0–17.0)
Immature Granulocytes: 0 %
Lymphocytes Relative: 18 %
Lymphs Abs: 1.5 10*3/uL (ref 0.7–4.0)
MCH: 33.5 pg (ref 26.0–34.0)
MCHC: 34.1 g/dL (ref 30.0–36.0)
MCV: 98.3 fL (ref 80.0–100.0)
Monocytes Absolute: 0.7 10*3/uL (ref 0.1–1.0)
Monocytes Relative: 8 %
Neutro Abs: 5.9 10*3/uL (ref 1.7–7.7)
Neutrophils Relative %: 72 %
Platelet Count: 115 10*3/uL — ABNORMAL LOW (ref 150–400)
RBC: 4.15 MIL/uL — ABNORMAL LOW (ref 4.22–5.81)
RDW: 12.7 % (ref 11.5–15.5)
WBC Count: 8.3 10*3/uL (ref 4.0–10.5)
nRBC: 0 % (ref 0.0–0.2)

## 2023-08-07 LAB — CMP (CANCER CENTER ONLY)
ALT: 18 U/L (ref 0–44)
AST: 19 U/L (ref 15–41)
Albumin: 3.8 g/dL (ref 3.5–5.0)
Alkaline Phosphatase: 92 U/L (ref 38–126)
Anion gap: 5 (ref 5–15)
BUN: 12 mg/dL (ref 8–23)
CO2: 28 mmol/L (ref 22–32)
Calcium: 9 mg/dL (ref 8.9–10.3)
Chloride: 108 mmol/L (ref 98–111)
Creatinine: 0.99 mg/dL (ref 0.61–1.24)
GFR, Estimated: >60 mL/min (ref >60)
Glucose, Bld: 194 mg/dL — ABNORMAL HIGH (ref 70–99)
Potassium: 3.9 mmol/L (ref 3.5–5.1)
Sodium: 141 mmol/L (ref 135–145)
Total Bilirubin: 0.8 mg/dL (ref 0.3–1.2)
Total Protein: 7 g/dL (ref 6.5–8.1)

## 2023-08-07 MED ORDER — PALONOSETRON HCL INJECTION 0.25 MG/5ML
0.2500 mg | Freq: Once | INTRAVENOUS | Status: AC
  Administered 2023-08-07: 0.25 mg via INTRAVENOUS
  Filled 2023-08-07: qty 5

## 2023-08-07 MED ORDER — SODIUM CHLORIDE 0.9 % IV SOLN: Freq: Once | INTRAVENOUS | Status: AC

## 2023-08-07 MED ORDER — ZOLPIDEM TARTRATE 10 MG PO TABS: 10.0000 mg | ORAL_TABLET | Freq: Every evening | ORAL | 0 refills | Status: DC | PRN

## 2023-08-07 MED ORDER — HEPARIN SOD (PORK) LOCK FLUSH 100 UNIT/ML IV SOLN: 500.0000 [IU] | Freq: Once | INTRAVENOUS | Status: DC | PRN

## 2023-08-07 MED ORDER — SODIUM CHLORIDE 0.9 % IV SOLN
400.0000 mg/m2 | Freq: Once | INTRAVENOUS | Status: AC
  Administered 2023-08-07: 852 mg via INTRAVENOUS
  Filled 2023-08-07: qty 25

## 2023-08-07 MED ORDER — SODIUM CHLORIDE 0.9 % IV SOLN
1800.0000 mg/m2 | INTRAVENOUS | Status: DC
  Administered 2023-08-07: 3500 mg via INTRAVENOUS
  Filled 2023-08-07: qty 70

## 2023-08-07 MED ORDER — SODIUM CHLORIDE 0.9 % IV SOLN
10.0000 mg | Freq: Once | INTRAVENOUS | Status: AC
  Administered 2023-08-07: 10 mg via INTRAVENOUS
  Filled 2023-08-07: qty 10

## 2023-08-07 MED ORDER — SODIUM CHLORIDE 0.9% FLUSH: 10.0000 mL | INTRAVENOUS | Status: DC | PRN

## 2023-08-07 MED ORDER — SODIUM CHLORIDE 0.9 % IV SOLN
50.0000 mg/m2 | Freq: Once | INTRAVENOUS | Status: AC
  Administered 2023-08-07: 107.5 mg via INTRAVENOUS
  Filled 2023-08-07: qty 25

## 2023-08-07 MED ORDER — SODIUM CHLORIDE 0.9% FLUSH
10.0000 mL | Freq: Once | INTRAVENOUS | Status: AC
  Administered 2023-08-07: 10 mL

## 2023-08-07 MED ORDER — ATROPINE SULFATE 1 MG/ML IV SOLN
0.5000 mg | Freq: Once | INTRAVENOUS | Status: DC | PRN
  Filled 2023-08-07: qty 1

## 2023-08-07 NOTE — Assessment & Plan Note (Signed)
Zachary Reid with numerous small cavitary lesions in bilateral lungs, MMR normal  -diagnosed 07/2021, after incidental finding on CT scan for kidney stone, by colonoscopy/EUS. -Baseline CA 19-9 elevated to 273 on 07/03/21 -He began first-line mFOLFIRINOX on 08/13/21, but required dose reduction and move to every 3 weeks. -his CA 19-9 trended up but is now stable. -restaging CT CAP 08/12/22 showed: multiple new and enlarging pulmonary metastases; pancreatic mass grossly stable. Will repeat in 3 months. -we changed his irinotecan to liposomal on 09/05/22. He tolerated better and feels stronger. -Due to disease progression, his treatment was changed to gemcitabine and Abraxane every 2 weeks on 01/02/2023 -He tolerated the first two cycles chemotherapy very well with mild fatigue.  I increased gemcitabine to 1000mg /m2 from cycle 3 -Restaging CT scan 03/12/2023 showed stable disease overall. His primary pancreatic tumor is slightly increased in size, however his multiple cavitary lung nodules are slightly improved with thinner walls now.   -will continue current therapy.  He is overall tolerating well, but he has developed worsening fatigue lately.  I will reduce gemcitabine dose to 800 mg/m again. -His tumor marker CA 19.9 continues trending down, which is a good clinical sign. -restaging CT 07/22/2023 showed moderate disease progression at the primary site and lung mets, I personally reviewed the scan images with patient and his wife in detail. -I changed his chemo to liposomal irinotecan and 5-FU pump infusion every 2 weeks on 08/07/2023 with dose reduction due to fatigue  -He understands the goal of chemotherapy is palliative, to prolong his life -We also discussed clinical trial option, I will reach out to [facility] and [facility] to see if they have options.

## 2023-08-07 NOTE — Patient Instructions (Signed)
[place] [facility]  Discharge Instructions: Thank you for choosing [facility] to provide your oncology and hematology care.   If you have a lab appointment with the Cancer Center, please go directly to the Cancer Center and check in at the registration area.   Wear comfortable clothing and clothing appropriate for easy access to any Portacath or PICC line.   We strive to give you quality time with your provider. You may need to reschedule your appointment if you arrive late (15 or more minutes).  Arriving late affects you and other patients whose appointments are after yours.  Also, if you miss three or more appointments without notifying the office, you may be dismissed from the clinic at the provider's discretion.      For prescription refill requests, have your pharmacy contact our office and allow 72 hours for refills to be completed.    Today you received the following chemotherapy and/or immunotherapy agents: Irinotecan liposome, Leucovorin, 5FU      To help prevent nausea and vomiting after your treatment, we encourage you to take your nausea medication as directed.  BELOW ARE SYMPTOMS THAT SHOULD BE REPORTED IMMEDIATELY: *FEVER GREATER THAN 100.4 F (38 C) OR HIGHER *CHILLS OR SWEATING *NAUSEA AND VOMITING THAT IS NOT CONTROLLED WITH YOUR NAUSEA MEDICATION *UNUSUAL SHORTNESS OF BREATH *UNUSUAL BRUISING OR BLEEDING *URINARY PROBLEMS (pain or burning when urinating, or frequent urination) *BOWEL PROBLEMS (unusual diarrhea, constipation, pain near the anus) TENDERNESS IN MOUTH AND THROAT WITH OR WITHOUT PRESENCE OF ULCERS (sore throat, sores in mouth, or a toothache) UNUSUAL RASH, SWELLING OR PAIN  UNUSUAL VAGINAL DISCHARGE OR ITCHING   Items with * indicate a potential emergency and should be followed up as soon as possible or go to the Emergency Department if any problems should occur.  Please show the CHEMOTHERAPY ALERT CARD or  IMMUNOTHERAPY ALERT CARD at check-in to the Emergency Department and triage nurse.  Should you have questions after your visit or need to cancel or reschedule your appointment, please contact [place] [facility]  Dept: [phone]  and follow the prompts.  Office hours are 8:00 a.m. to 4:30 p.m. Monday - Friday. Please note that voicemails left after 4:00 p.m. may not be returned until the following business day.  We are closed weekends and major holidays. You have access to a nurse at all times for urgent questions. Please call the main number to the clinic Dept: [phone] and follow the prompts.   For any non-urgent questions, you may also contact your provider using MyChart. We now offer e-Visits for anyone 63 and older to request care online for non-urgent symptoms. For details visit [email or web address].   Also download the MyChart app! Go to the app store, search "MyChart", open the app, select [place], and log in with your MyChart username and password.

## 2023-08-07 NOTE — Progress Notes (Signed)
Per Santiago Glad, NP, pt instructed to restart Xarelto as he discussed with NP.  Patient verbalized understanding.

## 2023-08-08 ENCOUNTER — Telehealth: Payer: Self-pay

## 2023-08-08 NOTE — Telephone Encounter (Signed)
-----   Message from Nurse Durene Cal B sent at 08/07/2023  2:05 PM EDT ----- Regarding: Dr. Mosetta Putt - first time irino/leuco/5FU - tolerated well Mr. Kuhar has received all of these medications before, just not this regimen.

## 2023-08-08 NOTE — Telephone Encounter (Signed)
LM for patient that this nurse was calling to see how they were doing after their treatment. Please call back to Dr.  Latanya Maudlin nurse at [phone] if they have any questions or concerns regarding the treatment.

## 2023-08-09 ENCOUNTER — Inpatient Hospital Stay: Payer: Medicare Other

## 2023-08-09 ENCOUNTER — Other Ambulatory Visit: Payer: Self-pay

## 2023-08-09 DIAGNOSIS — C259 Malignant neoplasm of pancreas, unspecified: Secondary | ICD-10-CM

## 2023-08-09 LAB — CANCER ANTIGEN 19-9: CA 19-9: 650 U/mL — ABNORMAL HIGH (ref 0–35)

## 2023-08-14 ENCOUNTER — Inpatient Hospital Stay: Payer: Medicare Other | Admitting: Hematology

## 2023-08-14 ENCOUNTER — Inpatient Hospital Stay: Payer: Medicare Other

## 2023-08-15 ENCOUNTER — Encounter: Payer: Self-pay | Admitting: Hematology

## 2023-08-16 ENCOUNTER — Inpatient Hospital Stay: Payer: Medicare Other

## 2023-08-18 LAB — GUARDANT 360

## 2023-08-19 MED FILL — Dexamethasone Sodium Phosphate Inj 100 MG/10ML: INTRAMUSCULAR | Qty: 1 | Status: AC

## 2023-08-19 NOTE — Progress Notes (Deleted)
Patient Care Team: Lucky Cowboy, MD as PCP - General (Internal Medicine) Pollyann Samples, NP as PCP - Hematology/Oncology (Nurse Practitioner) Thomasene Ripple, DO as PCP - Cardiology (Cardiology) Nadara Mustard, MD as Consulting Physician (Orthopedic Surgery) Laurey Morale, MD as Consulting Physician (Cardiology) Malachy Mood, MD as Consulting Physician (Oncology)   CHIEF COMPLAINT: Follow up pancreatic cancer   Oncology History Overview Note   Cancer Staging  Pancreatic adenocarcinoma [facility]) Staging form: Exocrine Pancreas, AJCC 8th Edition - Clinical stage from 07/26/2021: Stage IV (cT4, cN0, cM1) - Signed by Malachy Mood, MD on 07/28/2021    Pancreatic adenocarcinoma (HCC)  07/02/2021 Initial Diagnosis   Pancreatic adenocarcinoma (HCC)   07/26/2021 Cancer Staging   Staging form: Exocrine Pancreas, AJCC 8th Edition - Clinical stage from 07/26/2021: Stage IV (cT4, cN0, cM1) - Signed by Malachy Mood, MD on 07/28/2021 Stage prefix: Initial diagnosis Total positive nodes: 0   08/13/2021 - 07/06/2022 Chemotherapy   Patient is on Treatment Plan : PANCREAS Modified FOLFIRINOX q14d x 4 cycles      Genetic Testing   [facility] CancerNext-Expanded results (77 genes) were negative. No pathogenic variants were identified. 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 The report date is 08/15/2021.    The CancerNext-Expanded gene panel offered by [facility] and includes sequencing, rearrangement, and RNA analysis for the following 77 genes: AIP, ALK, APC, ATM, AXIN2, BAP1, BARD1, BLM, BMPR1A, BRCA1, BRCA2, BRIP1, CDC73, CDH1, CDK4, CDKN1B, CDKN2A, CHEK2, CTNNA1, DICER1, FANCC, FH, FLCN, GALNT12, KIF1B, LZTR1, MAX, MEN1, MET, MLH1, MSH2, MSH3, MSH6, MUTYH, NBN, NF1, NF2, NTHL1, PALB2, PHOX2B, PMS2, POT1, PRKAR1A, PTCH1, PTEN, RAD51C, RAD51D, RB1, RECQL, RET, SDHA, SDHAF2, SDHB, SDHC, SDHD, SMAD4, SMARCA4, SMARCB1, SMARCE1, STK11, SUFU, TMEM127, TP53, TSC1, TSC2, VHL and XRCC2  (sequencing and deletion/duplication); EGFR, EGLN1, HOXB13, KIT, MITF, PDGFRA, POLD1, and POLE (sequencing only); EPCAM and GREM1 (deletion/duplication only).     08/13/2021 - 12/06/2022 Chemotherapy   Patient is on Treatment Plan : PANCREAS Modified FOLFIRINOX q14d x 4 cycles     10/25/2021 Imaging   EXAM: CT CHEST, ABDOMEN, AND PELVIS WITH CONTRAST  IMPRESSION: 1. Innumerable bilateral pulmonary nodules, many of which are cavitary, consistent with metastatic disease. These nodules show no substantial change in are minimally progressed in the interval. 2. Mix cystic and solid lesion in the head and body of the pancreas is similar to prior and also comparing back to MRI 07/02/2021. 3. Hepatic cysts. 4. 8 mm nonobstructing left renal stone. 5. Aortic Atherosclerosis (ICD10-I70.0).   01/28/2022 Imaging   EXAM: CT CHEST, ABDOMEN, AND PELVIS WITH CONTRAST  IMPRESSION: 1. Innumerable bilateral small solid and cavitary pulmonary nodules, some of the cavitary nodules are slightly less thick-walled when compared with prior exam. 2. Mixed cystic and solid lesion in the head and body of the pancreas is similar to prior exam. 3. Nonobstructing left renal stone. 4.  Aortic Atherosclerosis (ICD10-I70.0).   11/29/2022 Imaging    IMPRESSION: 1. No significant change in primary pancreatic mass and adjacent cystic component. Unchanged appearance of soft tissue extending to involve the adjacent celiac axis, superior mesenteric artery origin, and portal confluence. 2. 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 3. Innumerable small pulmonary nodules throughout the lungs, the majority of which are cavitary. Although interval change is difficult to appreciate for the majority of these nodules due to small size, at least some of these are slightly enlarged, consistent with  worsened pulmonary metastatic disease. 4. Newly  enlarged left retroperitoneal lymph nodes, which may reflect nodal metastatic disease or perhaps reactive to left hydronephrosis. Attention on follow-up. 5. A sizable calculus previously seen in the left renal pelvis has migrated to the middle third of the left ureter, with placement of a double-J left ureteral stent, with formed pigtails in the left renal pelvis and urinary bladder. Moderate left hydronephrosis and proximal hydroureter. 6. Coronary artery disease.   Aortic Atherosclerosis (ICD10-I70.0).   01/02/2023 - 06/26/2023 Chemotherapy   Patient is on Treatment Plan : PANCREATIC Abraxane D1,8,15 + Gemcitabine D1,8,15 q28d     03/12/2023 Imaging    IMPRESSION: 1. Primary pancreatic mass with adjacent cystic component is slightly increased in size when compared with the prior exam. Similar soft tissue extending involve the celiac axis superior mesenteric artery origin and portal confluence. 2. Innumerable bilateral pulmonary nodules, some of which are cavitary, unchanged when compared with the prior. 3. Left retroperitoneal lymph nodes are decreased in size, likely resolving reactive adenopathy 4. Interval removal of left ureter stent.  No hydronephrosis. 5. coronary artery disease and aortic Atherosclerosis (ICD10-I70.0).   07/22/2023 Imaging   CT CAP W CONTRAST   IMPRESSION: 1. Today's study demonstrates progression of disease as evidenced by enlargement of the primary pancreatic mass, mild enlargement of numerous prominent borderline enlarged and mildly enlarged upper abdominal and retroperitoneal lymph nodes, and increased number and size of numerous metastatic nodules scattered throughout the lungs bilaterally, as detailed above. 2. 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 Main portal vein remains patent at this time. 3. 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 4. Nonobstructive nephrolithiasis in the kidneys bilaterally measuring 2-4 mm in size. 5. Aortic atherosclerosis, in addition to left main and three-vessel coronary artery disease. 6. Additional incidental findings, as above.   08/07/2023 -  Chemotherapy   Patient is on Treatment Plan : PANCREAS Liposomal Irinotecan + Leucovorin + 5-FU IVCI q14d        CURRENT THERAPY: next line FOLFIRI with liposomal irinotecan q14 days, starting 08/07/23   INTERVAL HISTORY Zachary Reid returns for follow up as scheduled. Last seen by me 08/07/23 with C1.   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 tobb;  newly dx ED 10-31-2022 w/ RVR  in setting flank pain (kidney stone)  --  office note in epic 11-07-2022 normal ETT 11-13-2022, echo 11-07-2022 ef 65-70% w/ mild LVH, mild MR,  zio monitor not completed,  started on toprol and xarelto daily   Personal history of chemotherapy    Receiving chemotherapy for metastatic pancreatic cancer. Most recent infusion (as of 12/02/22) was on 11/15/22.   Port-A-Cath in place 08/10/2021   Type 2 diabetes mellitus treated with insulin [facility])    endocrinologist--- dr Lucianne Muss   Vitamin D  deficiency    takes Vit D supplements   Wears contact lenses      Past Surgical History:  Procedure Laterality Date   APPENDECTOMY  1988   BIOPSY  07/26/2021   Procedure: BIOPSY;  Surgeon: Meridee Score Netty Starring., MD;   Location: [facility] ENDOSCOPY;  Service: Gastroenterology;;   COLONOSCOPY WITH PROPOFOL N/A 07/26/2021   Procedure: COLONOSCOPY WITH PROPOFOL;  Surgeon: Lemar Lofty., MD;  Location[facility] ENDOSCOPY;  Service: Gastroenterology;  Laterality: N/A;   CYSTOSCOPY WITH STENT PLACEMENT Left 10/31/2022   Procedure: CYSTOSCOPY WITH STENT PLACEMENT;  Surgeon: Belva Agee, MD;  Location: [facility] ORS;  Service: Urology;  Laterality: Left;   CYSTOSCOPY/URETEROSCOPY/HOLMIUM LASER/STENT PLACEMENT Left 12/03/2022   Procedure: CYSTOSCOPY/LEFT URETEROSCOPY/HOLMIUM LASER/STENT EXCHANGE;  Surgeon: Belva Agee, MD;  Location[facility];  Service: Urology;  Laterality: Left;  1 HR FOR CASE   ESOPHAGOGASTRODUODENOSCOPY (EGD) WITH PROPOFOL N/A 07/26/2021   Procedure: ESOPHAGOGASTRODUODENOSCOPY (EGD) WITH PROPOFOL;  Surgeon: Meridee Score Netty Starring., MD;  Location: [facility] ENDOSCOPY;  Service: Gastroenterology;  Laterality: N/A;   EUS N/A 07/26/2021   Procedure: UPPER ENDOSCOPIC ULTRASOUND (EUS) RADIAL;  Surgeon: Lemar Lofty., MD;  Location[facility] ENDOSCOPY;  Service: Gastroenterology;  Laterality: N/A;   FINE NEEDLE ASPIRATION  07/26/2021   Procedure: FINE NEEDLE ASPIRATION (FNA) LINEAR;  Surgeon: Lemar Lofty., MD;  Location: [facility] ENDOSCOPY;  Service: Gastroenterology;;   IR IMAGING GUIDED PORT INSERTION  08/10/2021   POLYPECTOMY  07/26/2021   Procedure: POLYPECTOMY;  Surgeon: Lemar Lofty., MD;  Location: [facility] ENDOSCOPY;  Service: Gastroenterology;;   SHOULDER SURGERY Right    as a teenager     Outpatient Encounter Medications as of 08/20/2023  Medication Sig   ALPRAZolam (XANAX) 0.5 MG tablet Take 1 tablet (0.5 mg total) by mouth 3 (three) times daily as needed for anxiety.   Cholecalciferol (VITAMIN D3) 125 MCG (5000 UT) CAPS Take 5,000 Units by mouth in the morning and at bedtime.   Continuous Blood Gluc Sensor (DEXCOM G7 SENSOR) MISC 1 Device by Does not apply route as  directed. Change sensor every 10 days (Patient taking differently: 1 Device by Does not apply route as directed. Change sensor every 10 days Per patient on 12/02/22, he has not yet received glucose monitor.)   gabapentin (NEURONTIN) 600 MG tablet Take 1 tablet (600 mg total) by mouth 3 (three) times daily as needed (pain).   glucose blood test strip Use as instructed   hyoscyamine (LEVSIN SL) 0.125 MG SL tablet DISSOLVE ONE TABLET UNDER THE TONGUE 4 TIMES DAILY UP TO EVERY 4 HOURS AS NEEDED FOR NAUSEA, BLOATING, CRAMPING, OR DIARRHEA   insulin degludec (TRESIBA FLEXTOUCH) 100 UNIT/ML FlexTouch Pen INJECT 32 UNITS INTO THE SKIN DAILY.   insulin lispro (HUMALOG KWIKPEN) 100 UNIT/ML KwikPen 10 TO 15 UNITS BEFORE MEALS AS DIRECTED   metoprolol succinate (TOPROL XL) 25 MG 24 hr tablet Take 0.5 tablets (12.5 mg total) by mouth daily. (Patient taking differently: Take 12.5 mg by mouth daily. Per patient, he will start taking Metoprolol on 12/02/22 and take again on the day of surgery (12/03/22.))   mirtazapine (REMERON) 7.5 MG tablet Take 1 tablet (7.5 mg total) by mouth at bedtime.   potassium chloride (KLOR-CON M) 10 MEQ tablet Take 1 tablet (10 mEq total) by mouth 2 (two) times daily.   prochlorperazine (COMPAZINE) 10 MG tablet Take 1 tablet (10 mg total) by mouth every 6 (six) hours as needed (Nausea or vomiting).   rivaroxaban (XARELTO) 20  MG TABS tablet Take 1 tablet (20 mg total) by mouth daily with supper. (Patient not taking: Reported on 08/07/2023)   silver sulfADIAZINE (SILVADENE) 1 % cream Apply 1 Application topically daily.   traMADol (ULTRAM) 50 MG tablet Take 1-2 tablets (50-100 mg total) by mouth every 6 (six) hours as needed.   zolpidem (AMBIEN) 10 MG tablet Take 1 tablet (10 mg total) by mouth at bedtime as needed. for sleep   No facility-administered encounter medications on file as of 08/20/2023.     There were no vitals filed for this visit. There is no height or weight on file to  calculate BMI.   PHYSICAL EXAM GENERAL:alert, no distress and comfortable SKIN: no rash  EYES: sclera clear NECK: without mass LYMPH:  no palpable cervical or supraclavicular lymphadenopathy  LUNGS: clear with normal breathing effort HEART: regular rate & rhythm, no lower extremity edema ABDOMEN: abdomen soft, non-tender and normal bowel sounds NEURO: alert & oriented x 3 with fluent speech, no focal motor/sensory deficits Breast exam:  PAC without erythema    CBC    Component Value Date/Time   WBC 8.3 08/07/2023 0912   WBC 5.2 06/05/2021 1130   RBC 4.15 (L) 08/07/2023 0912   HGB 13.9 08/07/2023 0912   HGB 11.2 (L) 11/07/2022 1649   HCT 40.8 08/07/2023 0912   HCT 32.3 (L) 11/07/2022 1649   PLT 115 (L) 08/07/2023 0912   PLT 127 (L) 11/07/2022 1649   MCV 98.3 08/07/2023 0912   MCV 101 (H) 11/07/2022 1649   MCH 33.5 08/07/2023 0912   MCHC 34.1 08/07/2023 0912   RDW 12.7 08/07/2023 0912   RDW 13.2 11/07/2022 1649   LYMPHSABS 1.5 08/07/2023 0912   MONOABS 0.7 08/07/2023 0912   EOSABS 0.2 08/07/2023 0912   BASOSABS 0.0 08/07/2023 0912     CMP     Component Value Date/Time   NA 141 08/07/2023 0912   K 3.9 08/07/2023 0912   CL 108 08/07/2023 0912   CO2 28 08/07/2023 0912   GLUCOSE 194 (H) 08/07/2023 0912   BUN 12 08/07/2023 0912   CREATININE 0.99 08/07/2023 0912   CREATININE 0.88 06/05/2021 1130   CALCIUM 9.0 08/07/2023 0912   PROT 7.0 08/07/2023 0912   ALBUMIN 3.8 08/07/2023 0912   AST 19 08/07/2023 0912   ALT 18 08/07/2023 0912   ALKPHOS 92 08/07/2023 0912   BILITOT 0.8 08/07/2023 0912   GFRNONAA >60 08/07/2023 0912   GFRNONAA 96 03/05/2021 0915   GFRAA 112 03/05/2021 0915     ASSESSMENT & PLAN:Zachary Reid is a 67 y.o. male with    1. Metastatic pancreatic adenocarcinoma, [id number] with numerous small cavitary lesions in bilateral lungs -found incidentally on recent CT scan for left kidney stone.  He does have some nonspecific symptoms, including epigastric  pain, chronic right flank pain, and a dry cough.  He has had significant weight loss in the past several years. -Diagnosed 07/2021, Cytology confirmed adenocarcinoma.  Baseline CA 19-9 elevated to 273 on 07/03/2021 -IHC molecular testing was normal. He is not a candidate for immunotherapy. -He began first-line mFOLFIRINOX on 08/13/2021, required dose reduction and moved to q3 weeks -Restaging CT CAP 08/12/22 showed stable new and enlarging pulmonary metastases, stable pancreatic mass -He switched irinotecan to liposomal q3 weeks on 09/05/2022; stopped due to progression - Changed to Gem/abraxane q2 weeks starting 12/2022, dose modifications for tolerance  -Restaging 02/2023 showed mixed response, CA 19-9 down trending -last CA 19-9 increased 180 --> 349 -Restaging CT  07/22/23 showed progression in the primary site and lungs; switched to next line FOLFIRI with liposomal irinotecan starting 08/07/23   2. Afib -found during ER visit for kidney stone -Saw cardiologist Dr. Thomasene Ripple, was in NSR with PAC's during exam. She prescribed toprol XL and xarelto. He takes metoprolol but stopped xarelto a while ago  -Afib on EKG 08/07/23, asymptomatic, I recommended to restart Xarelto due to thrombosis risk with Afib and hypercoagulable state from cancer/chemo.  -He agreed, cardiology aware   3. Genetic Testing -he reports pancreatic cancer in his mother, prostate cancer in his father, ovarian in PGM, and lung in PGF (smoker). -he proceeded with testing on 07/30/21. -Result shows a variant of uncertain significance in the CDKN1B gene -He is BRCA1/2 negative, not a candidate for PARP inhibitor -Guardant 360 drawn 08/07/23      PLAN:  No orders of the defined types were placed in this encounter.     All questions were answered. The patient knows to call the clinic with any problems, questions or concerns. No barriers to learning were detected. I spent *** counseling the patient face to face. The total time  spent in the appointment was *** and more than 50% was on counseling, review of test results, and coordination of care.   Santiago Glad, NP-C @DATE @

## 2023-08-20 ENCOUNTER — Inpatient Hospital Stay: Payer: Medicare Other

## 2023-08-20 ENCOUNTER — Telehealth: Payer: Self-pay

## 2023-08-20 ENCOUNTER — Inpatient Hospital Stay: Payer: Medicare Other | Admitting: Nurse Practitioner

## 2023-08-20 NOTE — Telephone Encounter (Signed)
Called the pt to make him aware of the appt that he had this morning. He stated that he was unaware, cause he usually have his appointment on Thursday. We were able to call Infusion and was able to accommodate and switch his appointment for tomorrow 08/21/2023. Pt verbalize understanding.   Hendrix Console M,CMA

## 2023-08-21 ENCOUNTER — Encounter: Payer: Self-pay | Admitting: Nurse Practitioner

## 2023-08-21 ENCOUNTER — Inpatient Hospital Stay: Payer: Medicare Other

## 2023-08-21 ENCOUNTER — Inpatient Hospital Stay: Payer: Medicare Other | Attending: Hematology

## 2023-08-21 ENCOUNTER — Inpatient Hospital Stay: Payer: Medicare Other | Admitting: Nurse Practitioner

## 2023-08-21 DIAGNOSIS — K580 Irritable bowel syndrome with diarrhea: Secondary | ICD-10-CM | POA: Insufficient documentation

## 2023-08-21 DIAGNOSIS — T451X5A Adverse effect of antineoplastic and immunosuppressive drugs, initial encounter: Secondary | ICD-10-CM | POA: Insufficient documentation

## 2023-08-21 DIAGNOSIS — I2510 Atherosclerotic heart disease of native coronary artery without angina pectoris: Secondary | ICD-10-CM | POA: Insufficient documentation

## 2023-08-21 DIAGNOSIS — I700 Atherosclerosis of aorta: Secondary | ICD-10-CM | POA: Insufficient documentation

## 2023-08-21 DIAGNOSIS — C7801 Secondary malignant neoplasm of right lung: Secondary | ICD-10-CM | POA: Diagnosis not present

## 2023-08-21 DIAGNOSIS — M549 Dorsalgia, unspecified: Secondary | ICD-10-CM | POA: Diagnosis not present

## 2023-08-21 DIAGNOSIS — C259 Malignant neoplasm of pancreas, unspecified: Secondary | ICD-10-CM | POA: Diagnosis not present

## 2023-08-21 DIAGNOSIS — I8500 Esophageal varices without bleeding: Secondary | ICD-10-CM | POA: Insufficient documentation

## 2023-08-21 DIAGNOSIS — E860 Dehydration: Secondary | ICD-10-CM

## 2023-08-21 DIAGNOSIS — Z860101 Personal history of adenomatous and serrated colon polyps: Secondary | ICD-10-CM | POA: Insufficient documentation

## 2023-08-21 DIAGNOSIS — Z87442 Personal history of urinary calculi: Secondary | ICD-10-CM | POA: Insufficient documentation

## 2023-08-21 DIAGNOSIS — C7802 Secondary malignant neoplasm of left lung: Secondary | ICD-10-CM | POA: Diagnosis not present

## 2023-08-21 DIAGNOSIS — Z7901 Long term (current) use of anticoagulants: Secondary | ICD-10-CM | POA: Insufficient documentation

## 2023-08-21 DIAGNOSIS — Z5111 Encounter for antineoplastic chemotherapy: Secondary | ICD-10-CM | POA: Diagnosis present

## 2023-08-21 DIAGNOSIS — K589 Irritable bowel syndrome without diarrhea: Secondary | ICD-10-CM | POA: Diagnosis not present

## 2023-08-21 DIAGNOSIS — E1122 Type 2 diabetes mellitus with diabetic chronic kidney disease: Secondary | ICD-10-CM | POA: Insufficient documentation

## 2023-08-21 DIAGNOSIS — E785 Hyperlipidemia, unspecified: Secondary | ICD-10-CM | POA: Insufficient documentation

## 2023-08-21 DIAGNOSIS — K7689 Other specified diseases of liver: Secondary | ICD-10-CM | POA: Diagnosis not present

## 2023-08-21 DIAGNOSIS — I82891 Chronic embolism and thrombosis of other specified veins: Secondary | ICD-10-CM | POA: Insufficient documentation

## 2023-08-21 DIAGNOSIS — Z79899 Other long term (current) drug therapy: Secondary | ICD-10-CM | POA: Insufficient documentation

## 2023-08-21 DIAGNOSIS — M62838 Other muscle spasm: Secondary | ICD-10-CM | POA: Insufficient documentation

## 2023-08-21 DIAGNOSIS — D696 Thrombocytopenia, unspecified: Secondary | ICD-10-CM | POA: Insufficient documentation

## 2023-08-21 DIAGNOSIS — R5383 Other fatigue: Secondary | ICD-10-CM | POA: Insufficient documentation

## 2023-08-21 DIAGNOSIS — Z794 Long term (current) use of insulin: Secondary | ICD-10-CM | POA: Insufficient documentation

## 2023-08-21 DIAGNOSIS — I480 Paroxysmal atrial fibrillation: Secondary | ICD-10-CM | POA: Diagnosis not present

## 2023-08-21 DIAGNOSIS — N132 Hydronephrosis with renal and ureteral calculous obstruction: Secondary | ICD-10-CM | POA: Diagnosis not present

## 2023-08-21 DIAGNOSIS — E559 Vitamin D deficiency, unspecified: Secondary | ICD-10-CM | POA: Diagnosis not present

## 2023-08-21 DIAGNOSIS — K521 Toxic gastroenteritis and colitis: Secondary | ICD-10-CM | POA: Diagnosis not present

## 2023-08-21 DIAGNOSIS — Z95828 Presence of other vascular implants and grafts: Secondary | ICD-10-CM

## 2023-08-21 LAB — CBC WITH DIFFERENTIAL (CANCER CENTER ONLY)
Abs Immature Granulocytes: 0.02 10*3/uL (ref 0.00–0.07)
Basophils Absolute: 0.1 10*3/uL (ref 0.0–0.1)
Basophils Relative: 1 %
Eosinophils Absolute: 0.3 10*3/uL (ref 0.0–0.5)
Eosinophils Relative: 3 %
HCT: 40.2 % (ref 39.0–52.0)
Hemoglobin: 13.8 g/dL (ref 13.0–17.0)
Immature Granulocytes: 0 %
Lymphocytes Relative: 26 %
Lymphs Abs: 2.2 10*3/uL (ref 0.7–4.0)
MCH: 33.3 pg (ref 26.0–34.0)
MCHC: 34.3 g/dL (ref 30.0–36.0)
MCV: 96.9 fL (ref 80.0–100.0)
Monocytes Absolute: 0.6 10*3/uL (ref 0.1–1.0)
Monocytes Relative: 7 %
Neutro Abs: 5.2 10*3/uL (ref 1.7–7.7)
Neutrophils Relative %: 63 %
Platelet Count: 138 10*3/uL — ABNORMAL LOW (ref 150–400)
RBC: 4.15 MIL/uL — ABNORMAL LOW (ref 4.22–5.81)
RDW: 12.9 % (ref 11.5–15.5)
WBC Count: 8.2 10*3/uL (ref 4.0–10.5)
nRBC: 0 % (ref 0.0–0.2)

## 2023-08-21 LAB — CMP (CANCER CENTER ONLY)
ALT: 19 U/L (ref 0–44)
AST: 17 U/L (ref 15–41)
Albumin: 4 g/dL (ref 3.5–5.0)
Alkaline Phosphatase: 101 U/L (ref 38–126)
Anion gap: 5 (ref 5–15)
BUN: 18 mg/dL (ref 8–23)
CO2: 28 mmol/L (ref 22–32)
Calcium: 9.4 mg/dL (ref 8.9–10.3)
Chloride: 108 mmol/L (ref 98–111)
Creatinine: 1.07 mg/dL (ref 0.61–1.24)
GFR, Estimated: >60 mL/min (ref >60)
Glucose, Bld: 123 mg/dL — ABNORMAL HIGH (ref 70–99)
Potassium: 3.9 mmol/L (ref 3.5–5.1)
Sodium: 141 mmol/L (ref 135–145)
Total Bilirubin: 0.6 mg/dL (ref 0.3–1.2)
Total Protein: 7.2 g/dL (ref 6.5–8.1)

## 2023-08-21 MED ORDER — HEPARIN SOD (PORK) LOCK FLUSH 100 UNIT/ML IV SOLN: 500.0000 [IU] | Freq: Once | INTRAVENOUS | Status: DC | PRN

## 2023-08-21 MED ORDER — OXYCODONE HCL 5 MG PO TABS: 5.0000 mg | ORAL_TABLET | Freq: Four times a day (QID) | ORAL | 0 refills | Status: DC | PRN

## 2023-08-21 MED ORDER — ATROPINE SULFATE 1 MG/ML IV SOLN: 0.5000 mg | Freq: Once | INTRAVENOUS | Status: DC | PRN

## 2023-08-21 MED ORDER — SODIUM CHLORIDE 0.9 % IV SOLN
10.0000 mg | Freq: Once | INTRAVENOUS | Status: AC
  Administered 2023-08-21: 10 mg via INTRAVENOUS
  Filled 2023-08-21: qty 10

## 2023-08-21 MED ORDER — TRAMADOL HCL 50 MG PO TABS: 50.0000 mg | ORAL_TABLET | Freq: Four times a day (QID) | ORAL | 0 refills | Status: DC | PRN

## 2023-08-21 MED ORDER — SODIUM CHLORIDE 0.9 % IV SOLN
50.0000 mg/m2 | Freq: Once | INTRAVENOUS | Status: AC
  Administered 2023-08-21: 107.5 mg via INTRAVENOUS
  Filled 2023-08-21: qty 25

## 2023-08-21 MED ORDER — SODIUM CHLORIDE 0.9% FLUSH: 10.0000 mL | INTRAVENOUS | Status: DC | PRN

## 2023-08-21 MED ORDER — SODIUM CHLORIDE 0.9 % IV SOLN: Freq: Once | INTRAVENOUS | Status: AC

## 2023-08-21 MED ORDER — RIVAROXABAN 20 MG PO TABS
20.0000 mg | ORAL_TABLET | Freq: Every day | ORAL | 3 refills | Status: DC
Start: 2023-08-21 — End: 2024-01-08

## 2023-08-21 MED ORDER — SODIUM CHLORIDE 0.9 % IV SOLN
1800.0000 mg/m2 | INTRAVENOUS | Status: DC
  Administered 2023-08-21: 3500 mg via INTRAVENOUS
  Filled 2023-08-21: qty 70

## 2023-08-21 MED ORDER — SODIUM CHLORIDE 0.9% FLUSH
10.0000 mL | Freq: Once | INTRAVENOUS | Status: AC
  Administered 2023-08-21: 10 mL

## 2023-08-21 MED ORDER — PALONOSETRON HCL INJECTION 0.25 MG/5ML
0.2500 mg | Freq: Once | INTRAVENOUS | Status: AC
  Administered 2023-08-21: 0.25 mg via INTRAVENOUS
  Filled 2023-08-21: qty 5

## 2023-08-21 MED ORDER — SODIUM CHLORIDE 0.9 % IV SOLN
150.0000 mg | Freq: Once | INTRAVENOUS | Status: AC
  Administered 2023-08-21: 150 mg via INTRAVENOUS
  Filled 2023-08-21: qty 150

## 2023-08-21 MED ORDER — SODIUM CHLORIDE 0.9 % IV SOLN
400.0000 mg/m2 | Freq: Once | INTRAVENOUS | Status: AC
  Administered 2023-08-21: 852 mg via INTRAVENOUS
  Filled 2023-08-21: qty 42.6

## 2023-08-21 MED ORDER — ONDANSETRON HCL 8 MG PO TABS
8.0000 mg | ORAL_TABLET | Freq: Three times a day (TID) | ORAL | 1 refills | Status: DC | PRN
Start: 2023-08-21 — End: 2023-10-22

## 2023-08-21 NOTE — Progress Notes (Signed)
Patient Care Team: Lucky Cowboy, MD as PCP - General (Internal Medicine) Pollyann Samples, NP as PCP - Hematology/Oncology (Nurse Practitioner) Thomasene Ripple, DO as PCP - Cardiology (Cardiology) Nadara Mustard, MD as Consulting Physician (Orthopedic Surgery) Laurey Morale, MD as Consulting Physician (Cardiology) Malachy Mood, MD as Consulting Physician (Oncology)   CHIEF COMPLAINT: Follow up pancreatic cancer   Oncology History Overview Note   Cancer Staging  Pancreatic adenocarcinoma [facility]) Staging form: Exocrine Pancreas, AJCC 8th Edition - Clinical stage from 07/26/2021: Stage IV (cT4, cN0, cM1) - Signed by Malachy Mood, MD on 07/28/2021    Pancreatic adenocarcinoma (HCC)  07/02/2021 Initial Diagnosis   Pancreatic adenocarcinoma (HCC)   07/26/2021 Cancer Staging   Staging form: Exocrine Pancreas, AJCC 8th Edition - Clinical stage from 07/26/2021: Stage IV (cT4, cN0, cM1) - Signed by Malachy Mood, MD on 07/28/2021 Stage prefix: Initial diagnosis Total positive nodes: 0   08/13/2021 - 07/06/2022 Chemotherapy   Patient is on Treatment Plan : PANCREAS Modified FOLFIRINOX q14d x 4 cycles      Genetic Testing   [facility] CancerNext-Expanded results (77 genes) were negative. No pathogenic variants were identified. 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 The report date is 08/15/2021.    The CancerNext-Expanded gene panel offered by [facility] and includes sequencing, rearrangement, and RNA analysis for the following 77 genes: AIP, ALK, APC, ATM, AXIN2, BAP1, BARD1, BLM, BMPR1A, BRCA1, BRCA2, BRIP1, CDC73, CDH1, CDK4, CDKN1B, CDKN2A, CHEK2, CTNNA1, DICER1, FANCC, FH, FLCN, GALNT12, KIF1B, LZTR1, MAX, MEN1, MET, MLH1, MSH2, MSH3, MSH6, MUTYH, NBN, NF1, NF2, NTHL1, PALB2, PHOX2B, PMS2, POT1, PRKAR1A, PTCH1, PTEN, RAD51C, RAD51D, RB1, RECQL, RET, SDHA, SDHAF2, SDHB, SDHC, SDHD, SMAD4, SMARCA4, SMARCB1, SMARCE1, STK11, SUFU, TMEM127, TP53, TSC1, TSC2, VHL and XRCC2  (sequencing and deletion/duplication); EGFR, EGLN1, HOXB13, KIT, MITF, PDGFRA, POLD1, and POLE (sequencing only); EPCAM and GREM1 (deletion/duplication only).     08/13/2021 - 12/06/2022 Chemotherapy   Patient is on Treatment Plan : PANCREAS Modified FOLFIRINOX q14d x 4 cycles     10/25/2021 Imaging   EXAM: CT CHEST, ABDOMEN, AND PELVIS WITH CONTRAST  IMPRESSION: 1. Innumerable bilateral pulmonary nodules, many of which are cavitary, consistent with metastatic disease. These nodules show no substantial change in are minimally progressed in the interval. 2. Mix cystic and solid lesion in the head and body of the pancreas is similar to prior and also comparing back to MRI 07/02/2021. 3. Hepatic cysts. 4. 8 mm nonobstructing left renal stone. 5. Aortic Atherosclerosis (ICD10-I70.0).   01/28/2022 Imaging   EXAM: CT CHEST, ABDOMEN, AND PELVIS WITH CONTRAST  IMPRESSION: 1. Innumerable bilateral small solid and cavitary pulmonary nodules, some of the cavitary nodules are slightly less thick-walled when compared with prior exam. 2. Mixed cystic and solid lesion in the head and body of the pancreas is similar to prior exam. 3. Nonobstructing left renal stone. 4.  Aortic Atherosclerosis (ICD10-I70.0).   11/29/2022 Imaging    IMPRESSION: 1. No significant change in primary pancreatic mass and adjacent cystic component. Unchanged appearance of soft tissue extending to involve the adjacent celiac axis, superior mesenteric artery origin, and portal confluence. 2. 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 3. Innumerable small pulmonary nodules throughout the lungs, the majority of which are cavitary. Although interval change is difficult to appreciate for the majority of these nodules due to small size, at least some of these are slightly enlarged, consistent with  worsened pulmonary metastatic disease. 4. Newly  enlarged left retroperitoneal lymph nodes, which may reflect nodal metastatic disease or perhaps reactive to left hydronephrosis. Attention on follow-up. 5. A sizable calculus previously seen in the left renal pelvis has migrated to the middle third of the left ureter, with placement of a double-J left ureteral stent, with formed pigtails in the left renal pelvis and urinary bladder. Moderate left hydronephrosis and proximal hydroureter. 6. Coronary artery disease.   Aortic Atherosclerosis (ICD10-I70.0).   01/02/2023 - 06/26/2023 Chemotherapy   Patient is on Treatment Plan : PANCREATIC Abraxane D1,8,15 + Gemcitabine D1,8,15 q28d     03/12/2023 Imaging    IMPRESSION: 1. Primary pancreatic mass with adjacent cystic component is slightly increased in size when compared with the prior exam. Similar soft tissue extending involve the celiac axis superior mesenteric artery origin and portal confluence. 2. Innumerable bilateral pulmonary nodules, some of which are cavitary, unchanged when compared with the prior. 3. Left retroperitoneal lymph nodes are decreased in size, likely resolving reactive adenopathy 4. Interval removal of left ureter stent.  No hydronephrosis. 5. coronary artery disease and aortic Atherosclerosis (ICD10-I70.0).   07/22/2023 Imaging   CT CAP W CONTRAST   IMPRESSION: 1. Today's study demonstrates progression of disease as evidenced by enlargement of the primary pancreatic mass, mild enlargement of numerous prominent borderline enlarged and mildly enlarged upper abdominal and retroperitoneal lymph nodes, and increased number and size of numerous metastatic nodules scattered throughout the lungs bilaterally, as detailed above. 2. 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 Main portal vein remains patent at this time. 3. 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 4. Nonobstructive nephrolithiasis in the kidneys bilaterally measuring 2-4 mm in size. 5. Aortic atherosclerosis, in addition to left main and three-vessel coronary artery disease. 6. Additional incidental findings, as above.   08/07/2023 -  Chemotherapy   Patient is on Treatment Plan : PANCREAS Liposomal Irinotecan + Leucovorin + 5-FU IVCI q14d        CURRENT THERAPY: next line FOLFIRI with liposomal irinotecan q14 days, starting 08/07/23   INTERVAL HISTORY Mr. Sawaya returns for follow up as scheduled. Last seen by me 08/07/23 with C1.  He felt okay until pump DC, then for 3 days after he had intermittent nausea and vomiting, improved daily.  Compazine helped but not for long.  He manages constipation with Dulcolax.  Fatigue is cumulative and moderate, still able to leave the house and do things.  He is finding it difficult to manage pain.  1 day he was completely pain-free and did not take any medication.  The next day he had to take oxycodone.  Generally just takes tramadol, but alternating with Oxy now to make it last longer.  Pain is a 6-7 most of the time.  ROS  All other systems reviewed and negative  Past Medical History:  Diagnosis Date   Adenocarcinoma of pancreas, stage 4 (HCC) 07/2021   oncologist--- dr Mosetta Putt;   mets to  bilateral lung;  started chemo 08-13-2021   Anemia    GAD (generalized anxiety disorder)    History of adenomatous polyp of colon    Hyperlipidemia    Hypogonadism male    IBS (irritable bowel syndrome)    Insomnia    Left ureteral calculus    Metastatic cancer to lung (HCC) 07/2021   primary pancreatic cancer w/ numerous small cavity lesions bilateral lungs  PAF (paroxysmal atrial fibrillation) (HCC) 10/31/2022   cardiologist-- dr Kirtland Bouchard. tobb;  newly dx ED 10-31-2022 w/ RVR  in setting flank pain (kidney stone)  --  office note in epic 11-07-2022 normal ETT 11-13-2022, echo 11-07-2022 ef 65-70% w/ mild LVH, mild MR,  zio  monitor not completed,  started on toprol and xarelto daily   Personal history of chemotherapy    Receiving chemotherapy for metastatic pancreatic cancer. Most recent infusion (as of 12/02/22) was on 11/15/22.   Port-A-Cath in place 08/10/2021   Type 2 diabetes mellitus treated with insulin [facility])    endocrinologist--- dr Lucianne Muss   Vitamin D deficiency    takes Vit D supplements   Wears contact lenses      Past Surgical History:  Procedure Laterality Date   APPENDECTOMY  1988   BIOPSY  07/26/2021   Procedure: BIOPSY;  Surgeon: Lemar Lofty., MD;  Location: [facility] ENDOSCOPY;  Service: Gastroenterology;;   COLONOSCOPY WITH PROPOFOL N/A 07/26/2021   Procedure: COLONOSCOPY WITH PROPOFOL;  Surgeon: Lemar Lofty., MD;  Location: [facility] ENDOSCOPY;  Service: Gastroenterology;  Laterality: N/A;   CYSTOSCOPY WITH STENT PLACEMENT Left 10/31/2022   Procedure: CYSTOSCOPY WITH STENT PLACEMENT;  Surgeon: Belva Agee, MD;  Location: [facility] ORS;  Service: Urology;  Laterality: Left;   CYSTOSCOPY/URETEROSCOPY/HOLMIUM LASER/STENT PLACEMENT Left 12/03/2022   Procedure: CYSTOSCOPY/LEFT URETEROSCOPY/HOLMIUM LASER/STENT EXCHANGE;  Surgeon: Belva Agee, MD;  Location: [facility];  Service: Urology;  Laterality: Left;  1 HR FOR CASE   ESOPHAGOGASTRODUODENOSCOPY (EGD) WITH PROPOFOL N/A 07/26/2021   Procedure: ESOPHAGOGASTRODUODENOSCOPY (EGD) WITH PROPOFOL;  Surgeon: Meridee Score Netty Starring., MD;  Location: [facility] ENDOSCOPY;  Service: Gastroenterology;  Laterality: N/A;   EUS N/A 07/26/2021   Procedure: UPPER ENDOSCOPIC ULTRASOUND (EUS) RADIAL;  Surgeon: Lemar Lofty., MD;  Location: [facility] ENDOSCOPY;  Service: Gastroenterology;  Laterality: N/A;   FINE NEEDLE ASPIRATION  07/26/2021   Procedure: FINE NEEDLE ASPIRATION (FNA) LINEAR;  Surgeon: Lemar Lofty., MD;  Location: [facility] ENDOSCOPY;  Service: Gastroenterology;;   IR IMAGING GUIDED PORT INSERTION  08/10/2021   POLYPECTOMY   07/26/2021   Procedure: POLYPECTOMY;  Surgeon: Lemar Lofty., MD;  Location: [facility] ENDOSCOPY;  Service: Gastroenterology;;   SHOULDER SURGERY Right    as a teenager     Outpatient Encounter Medications as of 08/21/2023  Medication Sig   ALPRAZolam (XANAX) 0.5 MG tablet Take 1 tablet (0.5 mg total) by mouth 3 (three) times daily as needed for anxiety.   Cholecalciferol (VITAMIN D3) 125 MCG (5000 UT) CAPS Take 5,000 Units by mouth in the morning and at bedtime.   Continuous Blood Gluc Sensor (DEXCOM G7 SENSOR) MISC 1 Device by Does not apply route as directed. Change sensor every 10 days (Patient taking differently: 1 Device by Does not apply route as directed. Change sensor every 10 days Per patient on 12/02/22, he has not yet received glucose monitor.)   gabapentin (NEURONTIN) 600 MG tablet Take 1 tablet (600 mg total) by mouth 3 (three) times daily as needed (pain).   glucose blood test strip Use as instructed   insulin degludec (TRESIBA FLEXTOUCH) 100 UNIT/ML FlexTouch Pen INJECT 32 UNITS INTO THE SKIN DAILY.   insulin lispro (HUMALOG KWIKPEN) 100 UNIT/ML KwikPen 10 TO 15 UNITS BEFORE MEALS AS DIRECTED   ondansetron (ZOFRAN) 8 MG tablet Take 1 tablet (8 mg total) by mouth every 8 (eight) hours as needed for nausea or vomiting.   prochlorperazine (COMPAZINE) 10 MG tablet Take 1 tablet (10 mg  total) by mouth every 6 (six) hours as needed (Nausea or vomiting).   zolpidem (AMBIEN) 10 MG tablet Take 1 tablet (10 mg total) by mouth at bedtime as needed. for sleep   [DISCONTINUED] traMADol (ULTRAM) 50 MG tablet Take 1-2 tablets (50-100 mg total) by mouth every 6 (six) hours as needed.   hyoscyamine (LEVSIN SL) 0.125 MG SL tablet DISSOLVE ONE TABLET UNDER THE TONGUE 4 TIMES DAILY UP TO EVERY 4 HOURS AS NEEDED FOR NAUSEA, BLOATING, CRAMPING, OR DIARRHEA   metoprolol succinate (TOPROL XL) 25 MG 24 hr tablet Take 0.5 tablets (12.5 mg total) by mouth daily. (Patient not taking: Reported on  08/21/2023)   mirtazapine (REMERON) 7.5 MG tablet Take 1 tablet (7.5 mg total) by mouth at bedtime. (Patient not taking: Reported on 08/21/2023)   oxyCODONE (ROXICODONE) 5 MG immediate release tablet Take 1 tablet (5 mg total) by mouth every 6 (six) hours as needed for severe pain.   potassium chloride (KLOR-CON M) 10 MEQ tablet Take 1 tablet (10 mEq total) by mouth 2 (two) times daily. (Patient not taking: Reported on 08/21/2023)   rivaroxaban (XARELTO) 20 MG TABS tablet Take 1 tablet (20 mg total) by mouth daily with supper.   silver sulfADIAZINE (SILVADENE) 1 % cream Apply 1 Application topically daily. (Patient not taking: Reported on 08/21/2023)   traMADol (ULTRAM) 50 MG tablet Take 1-2 tablets (50-100 mg total) by mouth every 6 (six) hours as needed.   [DISCONTINUED] rivaroxaban (XARELTO) 20 MG TABS tablet Take 1 tablet (20 mg total) by mouth daily with supper. (Patient not taking: Reported on 08/07/2023)   No facility-administered encounter medications on file as of 08/21/2023.     Today's Vitals   08/21/23 0800  BP: 103/68  Pulse: 96  Resp: 18  Temp: 98 F (36.7 C)  TempSrc: Oral  SpO2: 98%  Weight: 197 lb 6.4 oz (89.5 kg)  Height: 5\' 11"  (1.803 m)  PainSc: 1    Body mass index is 27.53 kg/m.   PHYSICAL EXAM GENERAL:alert, no distress and comfortable SKIN: no rash  EYES: sclera clear NECK: without mass LYMPH:  no palpable cervical or supraclavicular lymphadenopathy  LUNGS: clear with normal breathing effort HEART: regular rate & rhythm, no lower extremity edema ABDOMEN: abdomen soft, non-tender and normal bowel sounds NEURO: alert & oriented x 3 with fluent speech, no focal motor deficits PAC without erythema    CBC    Component Value Date/Time   WBC 8.2 08/21/2023 0908   WBC 5.2 06/05/2021 1130   RBC 4.15 (L) 08/21/2023 0908   HGB 13.8 08/21/2023 0908   HGB 11.2 (L) 11/07/2022 1649   HCT 40.2 08/21/2023 0908   HCT 32.3 (L) 11/07/2022 1649   PLT 138 (L) 08/21/2023  0908   PLT 127 (L) 11/07/2022 1649   MCV 96.9 08/21/2023 0908   MCV 101 (H) 11/07/2022 1649   MCH 33.3 08/21/2023 0908   MCHC 34.3 08/21/2023 0908   RDW 12.9 08/21/2023 0908   RDW 13.2 11/07/2022 1649   LYMPHSABS 2.2 08/21/2023 0908   MONOABS 0.6 08/21/2023 0908   EOSABS 0.3 08/21/2023 0908   BASOSABS 0.1 08/21/2023 0908     CMP     Component Value Date/Time   NA 141 08/21/2023 0908   K 3.9 08/21/2023 0908   CL 108 08/21/2023 0908   CO2 28 08/21/2023 0908   GLUCOSE 123 (H) 08/21/2023 0908   BUN 18 08/21/2023 0908   CREATININE 1.07 08/21/2023 0908   CREATININE 0.88 06/05/2021  1130   CALCIUM 9.4 08/21/2023 0908   PROT 7.2 08/21/2023 0908   ALBUMIN 4.0 08/21/2023 0908   AST 17 08/21/2023 0908   ALT 19 08/21/2023 0908   ALKPHOS 101 08/21/2023 0908   BILITOT 0.6 08/21/2023 0908   GFRNONAA >60 08/21/2023 0908   GFRNONAA 96 03/05/2021 0915   GFRAA 112 03/05/2021 0915     ASSESSMENT & PLAN:Zachary Reid is a 67 y.o. male with    1. Metastatic pancreatic adenocarcinoma, [id number] with numerous small cavitary lesions in bilateral lungs -found incidentally on recent CT scan for left kidney stone.  He does have some nonspecific symptoms, including epigastric pain, chronic right flank pain, and a dry cough.  He has had significant weight loss in the past several years. -Diagnosed 07/2021, Cytology confirmed adenocarcinoma.  Baseline CA 19-9 elevated to 273 on 07/03/2021 -IHC molecular testing was normal. He is not a candidate for immunotherapy. -He began first-line mFOLFIRINOX on 08/13/2021, required dose reduction and moved to q3 weeks -Restaging CT CAP 08/12/22 showed stable new and enlarging pulmonary metastases, stable pancreatic mass -He switched irinotecan to liposomal q3 weeks on 09/05/2022; stopped due to progression - Changed to Gem/abraxane q2 weeks starting 12/2022, dose modifications for tolerance  -Restaging 02/2023 showed mixed response, CA 19-9 down trending -last CA 19-9  increased 180 --> 349 -Restaging CT 07/22/23 showed progression in the primary site and lungs; switched to next line FOLFIRI with liposomal irinotecan starting 08/07/23 -Mr. Loveall appears stable. S/p C1, tolerated moderately well with fatigue and delayed N/V from day 5 x3-4 days. Side effects were partially managed with supportive care at home.  -We reviewed symptom management, will add Emend to pre-meds, and add zofran and alternate with compazine. If he has persistent N/V will need to reduce chemo with next cycle -We reviewed pain management, I recommend tramadol for moderate pain and oxycodone for severe pain; I refilled. I recommend palliative care referral and he agreed.   -I reviewed Guardant 360 which showed KRAS TP53 and ATM mutations; will investigate the possibility of parp inhibitor in ATM mutation. For now continue same regimen.  -Labs reviewed, adequate to proceed with C2 liposomal irinotecan FOLFIRI today as scheduled, same dose and additional supportive care as above -F/up and C3 in 2 weeks      2. Afib -found during ER visit for kidney stone -Saw cardiologist Dr. Thomasene Ripple, was in NSR with PAC's during exam. She prescribed toprol XL and xarelto. He takes metoprolol but stopped xarelto a while ago  -Afib on EKG 08/07/23, asymptomatic, I recommended to restart Xarelto due to thrombosis risk with Afib and hypercoagulable state from cancer/chemo.  -He agreed, cardiology aware.  -Has been taking it every other day since his supply is low. Refilled    3. Genetic Testing -he reports pancreatic cancer in his mother, prostate cancer in his father, ovarian in PGM, and lung in PGF (smoker). -he proceeded with testing on 07/30/21. -Result shows a variant of uncertain significance in the CDKN1B gene -Genetic blood test was negative -Guardant 360 drawn 08/07/23 shows KRAS, TP53, and ATM mutations     PLAN: -Guardant 360 and today's labs reviewed  -Proceed with C2 liposomal irinotecan  FOLFIRI - add emend  -Reviewed symptom management for n/v, pain -New Rx: zofran; Refills: tramadol, oxy, ambien, xarelto  -Palliative care referral for pain management -F/up and C3 in 2 weeks   Orders Placed This Encounter  Procedures   Amb Referral to Palliative Care    Referral Priority:  Routine    Referral Type:   Consultation    Referred to Provider:   Glee Arvin, NP    Number of Visits Requested:   1      All questions were answered. The patient knows to call the clinic with any problems, questions or concerns. No barriers to learning were detected. I spent 20 minutes counseling the patient face to face. The total time spent in the appointment was 30 minutes and more than 50% was on counseling, review of test results, and coordination of care.   Santiago Glad, NP-C 08/21/2023

## 2023-08-21 NOTE — Patient Instructions (Signed)
[place] [facility]  Discharge Instructions: Thank you for choosing [facility] to provide your oncology and hematology care.   If you have a lab appointment with the Cancer Center, please go directly to the Cancer Center and check in at the registration area.   Wear comfortable clothing and clothing appropriate for easy access to any Portacath or PICC line.   We strive to give you quality time with your provider. You may need to reschedule your appointment if you arrive late (15 or more minutes).  Arriving late affects you and other patients whose appointments are after yours.  Also, if you miss three or more appointments without notifying the office, you may be dismissed from the clinic at the provider's discretion.      For prescription refill requests, have your pharmacy contact our office and allow 72 hours for refills to be completed.    Today you received the following chemotherapy and/or immunotherapy agents: Irinotecan liposome, Leucovorin, 5FU      To help prevent nausea and vomiting after your treatment, we encourage you to take your nausea medication as directed.  BELOW ARE SYMPTOMS THAT SHOULD BE REPORTED IMMEDIATELY: *FEVER GREATER THAN 100.4 F (38 C) OR HIGHER *CHILLS OR SWEATING *NAUSEA AND VOMITING THAT IS NOT CONTROLLED WITH YOUR NAUSEA MEDICATION *UNUSUAL SHORTNESS OF BREATH *UNUSUAL BRUISING OR BLEEDING *URINARY PROBLEMS (pain or burning when urinating, or frequent urination) *BOWEL PROBLEMS (unusual diarrhea, constipation, pain near the anus) TENDERNESS IN MOUTH AND THROAT WITH OR WITHOUT PRESENCE OF ULCERS (sore throat, sores in mouth, or a toothache) UNUSUAL RASH, SWELLING OR PAIN  UNUSUAL VAGINAL DISCHARGE OR ITCHING   Items with * indicate a potential emergency and should be followed up as soon as possible or go to the Emergency Department if any problems should occur.  Please show the CHEMOTHERAPY ALERT CARD or  IMMUNOTHERAPY ALERT CARD at check-in to the Emergency Department and triage nurse.  Should you have questions after your visit or need to cancel or reschedule your appointment, please contact [place] [facility]  Dept: [phone]  and follow the prompts.  Office hours are 8:00 a.m. to 4:30 p.m. Monday - Friday. Please note that voicemails left after 4:00 p.m. may not be returned until the following business day.  We are closed weekends and major holidays. You have access to a nurse at all times for urgent questions. Please call the main number to the clinic Dept: [phone] and follow the prompts.   For any non-urgent questions, you may also contact your provider using MyChart. We now offer e-Visits for anyone 63 and older to request care online for non-urgent symptoms. For details visit [email or web address].   Also download the MyChart app! Go to the app store, search "MyChart", open the app, select [place], and log in with your MyChart username and password.

## 2023-08-22 ENCOUNTER — Inpatient Hospital Stay: Payer: Medicare Other

## 2023-08-23 ENCOUNTER — Inpatient Hospital Stay: Payer: Medicare Other

## 2023-08-23 VITALS — BP 118/77 | HR 64 | Temp 97.7°F | Resp 14

## 2023-08-23 DIAGNOSIS — C259 Malignant neoplasm of pancreas, unspecified: Secondary | ICD-10-CM

## 2023-08-23 DIAGNOSIS — Z5111 Encounter for antineoplastic chemotherapy: Secondary | ICD-10-CM | POA: Diagnosis not present

## 2023-08-23 MED ORDER — SODIUM CHLORIDE 0.9% FLUSH
10.0000 mL | INTRAVENOUS | Status: DC | PRN
  Administered 2023-08-23: 10 mL

## 2023-08-23 MED ORDER — HEPARIN SOD (PORK) LOCK FLUSH 100 UNIT/ML IV SOLN
500.0000 [IU] | Freq: Once | INTRAVENOUS | Status: AC | PRN
  Administered 2023-08-23: 500 [IU]

## 2023-08-28 ENCOUNTER — Other Ambulatory Visit: Payer: Medicare Other

## 2023-08-28 ENCOUNTER — Other Ambulatory Visit: Payer: Self-pay | Admitting: Nurse Practitioner

## 2023-08-28 ENCOUNTER — Ambulatory Visit: Payer: Medicare Other | Admitting: Nurse Practitioner

## 2023-08-28 ENCOUNTER — Ambulatory Visit: Payer: Medicare Other

## 2023-08-28 DIAGNOSIS — C259 Malignant neoplasm of pancreas, unspecified: Secondary | ICD-10-CM

## 2023-09-01 ENCOUNTER — Other Ambulatory Visit: Payer: Self-pay | Admitting: Hematology

## 2023-09-01 DIAGNOSIS — C259 Malignant neoplasm of pancreas, unspecified: Secondary | ICD-10-CM

## 2023-09-01 NOTE — Progress Notes (Deleted)
Palliative Medicine [facility]  [phone] [phone]   Name: Zachary Reid Date: 09/01/2023 MRN: [id number]  DOB: 06/08/1956  Patient Care Team: Lucky Cowboy, MD as PCP - General (Internal Medicine) Pollyann Samples, NP as PCP - Hematology/Oncology (Nurse Practitioner) Thomasene Ripple, DO as PCP - Cardiology (Cardiology) Nadara Mustard, MD as Consulting Physician (Orthopedic Surgery) Laurey Morale, MD as Consulting Physician (Cardiology) Malachy Mood, MD as Consulting Physician (Oncology)    REASON FOR CONSULTATION: Zachary Reid is a 67 y.o. male with oncologic medical history including pancreatic adenocarcinoma (06/2021), HLD, diabetes, CKD, HTN, and anxiety. Palliative ask to see for symptom management and goals of care.    SOCIAL HISTORY:     reports that he has never smoked. He has never used smokeless tobacco. He reports that he does not drink alcohol and does not use drugs.  ADVANCE DIRECTIVES:  Advanced directives on file naming wife, Zachary Reid, as patient healthcare power of attorney, and daughter, Zachary Reid as Counsellor.  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 tobb;  newly dx ED 10-31-2022 w/ RVR  in setting flank pain (kidney stone)  --  office note in epic 11-07-2022 normal ETT 11-13-2022, echo 11-07-2022 ef 65-70% w/ mild LVH, mild  MR,  zio monitor not completed,  started on toprol and xarelto daily   Personal history of chemotherapy    Receiving chemotherapy for metastatic pancreatic cancer. Most recent infusion (as of 12/02/22) was on 11/15/22.   Port-A-Cath in place 08/10/2021   Type 2 diabetes mellitus treated with insulin [facility])    endocrinologist--- dr Lucianne Muss   Vitamin D deficiency    takes Vit D supplements   Wears contact lenses     PAST SURGICAL HISTORY:  Past Surgical History:  Procedure Laterality Date   APPENDECTOMY  1988   BIOPSY  07/26/2021   Procedure: BIOPSY;  Surgeon: Lemar Lofty., MD;  Location: [facility] ENDOSCOPY;  Service: Gastroenterology;;   COLONOSCOPY WITH PROPOFOL N/A 07/26/2021   Procedure: COLONOSCOPY WITH PROPOFOL;  Surgeon: Lemar Lofty., MD;  Location: [facility] ENDOSCOPY;  Service: Gastroenterology;  Laterality: N/A;   CYSTOSCOPY WITH STENT PLACEMENT Left 10/31/2022   Procedure: CYSTOSCOPY WITH STENT PLACEMENT;  Surgeon: Belva Agee, MD;  Location: [facility] ORS;  Service: Urology;  Laterality: Left;   CYSTOSCOPY/URETEROSCOPY/HOLMIUM LASER/STENT PLACEMENT Left 12/03/2022   Procedure: CYSTOSCOPY/LEFT URETEROSCOPY/HOLMIUM LASER/STENT EXCHANGE;  Surgeon: Belva Agee, MD;  Location: [facility];  Service: Urology;  Laterality: Left;  1 HR FOR CASE   ESOPHAGOGASTRODUODENOSCOPY (EGD) WITH PROPOFOL N/A 07/26/2021   Procedure: ESOPHAGOGASTRODUODENOSCOPY (EGD) WITH PROPOFOL;  Surgeon: Meridee Score Netty Starring., MD;  Location: [facility] ENDOSCOPY;  Service: Gastroenterology;  Laterality: N/A;   EUS N/A 07/26/2021   Procedure: UPPER ENDOSCOPIC ULTRASOUND (EUS) RADIAL;  Surgeon: Meridee Score Netty Starring., MD;  Location: [facility]  ENDOSCOPY;  Service: Gastroenterology;  Laterality: N/A;   FINE NEEDLE ASPIRATION  07/26/2021   Procedure: FINE NEEDLE ASPIRATION (FNA) LINEAR;  Surgeon: Meridee Score Netty Starring., MD;  Location: [facility] ENDOSCOPY;  Service: Gastroenterology;;   IR IMAGING GUIDED PORT  INSERTION  08/10/2021   POLYPECTOMY  07/26/2021   Procedure: POLYPECTOMY;  Surgeon: Meridee Score Netty Starring., MD;  Location: [facility] ENDOSCOPY;  Service: Gastroenterology;;   SHOULDER SURGERY Right    as a teenager    HEMATOLOGY/ONCOLOGY HISTORY:  Oncology History Overview Note   Cancer Staging  Pancreatic adenocarcinoma [facility]) Staging form: Exocrine Pancreas, AJCC 8th Edition - Clinical stage from 07/26/2021: Stage IV (cT4, cN0, cM1) - Signed by Malachy Mood, MD on 07/28/2021    Pancreatic adenocarcinoma (HCC)  07/02/2021 Initial Diagnosis   Pancreatic adenocarcinoma (HCC)   07/26/2021 Cancer Staging   Staging form: Exocrine Pancreas, AJCC 8th Edition - Clinical stage from 07/26/2021: Stage IV (cT4, cN0, cM1) - Signed by Malachy Mood, MD on 07/28/2021 Stage prefix: Initial diagnosis Total positive nodes: 0   08/13/2021 - 07/06/2022 Chemotherapy   Patient is on Treatment Plan : PANCREAS Modified FOLFIRINOX q14d x 4 cycles      Genetic Testing   [facility] CancerNext-Expanded results (77 genes) were negative. No pathogenic variants were identified. 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 The report date is 08/15/2021.    The CancerNext-Expanded gene panel offered by [facility] and includes sequencing, rearrangement, and RNA analysis for the following 77 genes: AIP, ALK, APC, ATM, AXIN2, BAP1, BARD1, BLM, BMPR1A, BRCA1, BRCA2, BRIP1, CDC73, CDH1, CDK4, CDKN1B, CDKN2A, CHEK2, CTNNA1, DICER1, FANCC, FH, FLCN, GALNT12, KIF1B, LZTR1, MAX, MEN1, MET, MLH1, MSH2, MSH3, MSH6, MUTYH, NBN, NF1, NF2, NTHL1, PALB2, PHOX2B, PMS2, POT1, PRKAR1A, PTCH1, PTEN, RAD51C, RAD51D, RB1, RECQL, RET, SDHA, SDHAF2, SDHB, SDHC, SDHD, SMAD4, SMARCA4, SMARCB1, SMARCE1, STK11, SUFU, TMEM127, TP53, TSC1, TSC2, VHL and XRCC2 (sequencing and deletion/duplication); EGFR, EGLN1, HOXB13, KIT, MITF, PDGFRA, POLD1, and POLE (sequencing only); EPCAM and GREM1 (deletion/duplication only).     08/13/2021 -  12/06/2022 Chemotherapy   Patient is on Treatment Plan : PANCREAS Modified FOLFIRINOX q14d x 4 cycles     10/25/2021 Imaging   EXAM: CT CHEST, ABDOMEN, AND PELVIS WITH CONTRAST  IMPRESSION: 1. Innumerable bilateral pulmonary nodules, many of which are cavitary, consistent with metastatic disease. These nodules show no substantial change in are minimally progressed in the interval. 2. Mix cystic and solid lesion in the head and body of the pancreas is similar to prior and also comparing back to MRI 07/02/2021. 3. Hepatic cysts. 4. 8 mm nonobstructing left renal stone. 5. Aortic Atherosclerosis (ICD10-I70.0).   01/28/2022 Imaging   EXAM: CT CHEST, ABDOMEN, AND PELVIS WITH CONTRAST  IMPRESSION: 1. Innumerable bilateral small solid and cavitary pulmonary nodules, some of the cavitary nodules are slightly less thick-walled when compared with prior exam. 2. Mixed cystic and solid lesion in the head and body of the pancreas is similar to prior exam. 3. Nonobstructing left renal stone. 4.  Aortic Atherosclerosis (ICD10-I70.0).   11/29/2022 Imaging    IMPRESSION: 1. No significant change in primary pancreatic mass and adjacent cystic component. Unchanged appearance of soft tissue extending to involve the adjacent celiac axis, superior mesenteric artery origin, and portal confluence. 2. 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 3. Innumerable small pulmonary nodules throughout the lungs, the majority of which are cavitary. Although interval change is difficult to appreciate for the  majority of these nodules due to small size, at least some of these are slightly enlarged, consistent with worsened pulmonary metastatic disease. 4. Newly enlarged left retroperitoneal lymph nodes, which may reflect nodal metastatic disease or perhaps reactive to left hydronephrosis. Attention on follow-up. 5. A sizable calculus  previously seen in the left renal pelvis has migrated to the middle third of the left ureter, with placement of a double-J left ureteral stent, with formed pigtails in the left renal pelvis and urinary bladder. Moderate left hydronephrosis and proximal hydroureter. 6. Coronary artery disease.   Aortic Atherosclerosis (ICD10-I70.0).   01/02/2023 - 06/26/2023 Chemotherapy   Patient is on Treatment Plan : PANCREATIC Abraxane D1,8,15 + Gemcitabine D1,8,15 q28d     03/12/2023 Imaging    IMPRESSION: 1. Primary pancreatic mass with adjacent cystic component is slightly increased in size when compared with the prior exam. Similar soft tissue extending involve the celiac axis superior mesenteric artery origin and portal confluence. 2. Innumerable bilateral pulmonary nodules, some of which are cavitary, unchanged when compared with the prior. 3. Left retroperitoneal lymph nodes are decreased in size, likely resolving reactive adenopathy 4. Interval removal of left ureter stent.  No hydronephrosis. 5. coronary artery disease and aortic Atherosclerosis (ICD10-I70.0).   07/22/2023 Imaging   CT CAP W CONTRAST   IMPRESSION: 1. Today's study demonstrates progression of disease as evidenced by enlargement of the primary pancreatic mass, mild enlargement of numerous prominent borderline enlarged and mildly enlarged upper abdominal and retroperitoneal lymph nodes, and increased number and size of numerous metastatic nodules scattered throughout the lungs bilaterally, as detailed above. 2. 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 Main portal vein remains patent at this time. 3. 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 4. Nonobstructive nephrolithiasis in the kidneys bilaterally measuring 2-4 mm in size. 5. Aortic atherosclerosis, in addition to left main and  three-vessel coronary artery disease. 6. Additional incidental findings, as above.   08/07/2023 -  Chemotherapy   Patient is on Treatment Plan : PANCREAS Liposomal Irinotecan + Leucovorin + 5-FU IVCI q14d       ALLERGIES:  is allergic to lipitor [atorvastatin].  MEDICATIONS:  Current Outpatient Medications  Medication Sig Dispense Refill   ALPRAZolam (XANAX) 0.5 MG tablet Take 1 tablet (0.5 mg total) by mouth 3 (three) times daily as needed for anxiety. 60 tablet 0   Cholecalciferol (VITAMIN D3) 125 MCG (5000 UT) CAPS Take 5,000 Units by mouth in the morning and at bedtime.     Continuous Blood Gluc Sensor (DEXCOM G7 SENSOR) MISC 1 Device by Does not apply route as directed. Change sensor every 10 days (Patient taking differently: 1 Device by Does not apply route as directed. Change sensor every 10 days Per patient on 12/02/22, he has not yet received glucose monitor.) 3 each 3   gabapentin (NEURONTIN) 600 MG tablet Take 1 tablet (600 mg total) by mouth 3 (three) times daily as needed (pain). 90 tablet 0   glucose blood test strip Use as instructed 100 each 12   hyoscyamine (LEVSIN SL) 0.125 MG SL tablet DISSOLVE ONE TABLET UNDER THE TONGUE 4 TIMES DAILY UP TO EVERY 4 HOURS AS NEEDED FOR NAUSEA, BLOATING, CRAMPING, OR DIARRHEA 100 tablet 0   insulin degludec (TRESIBA FLEXTOUCH) 100 UNIT/ML FlexTouch Pen INJECT 32 UNITS INTO THE SKIN DAILY. 15 mL 1   insulin lispro (HUMALOG KWIKPEN) 100 UNIT/ML KwikPen 10 TO 15 UNITS  BEFORE MEALS AS DIRECTED 15 mL 0   metoprolol succinate (TOPROL XL) 25 MG 24 hr tablet Take 0.5 tablets (12.5 mg total) by mouth daily. (Patient not taking: Reported on 08/21/2023) 45 tablet 3   mirtazapine (REMERON) 7.5 MG tablet Take 1 tablet (7.5 mg total) by mouth at bedtime. (Patient not taking: Reported on 08/21/2023) 90 tablet 1   ondansetron (ZOFRAN) 8 MG tablet Take 1 tablet (8 mg total) by mouth every 8 (eight) hours as needed for nausea or vomiting. 40 tablet 1    oxyCODONE (ROXICODONE) 5 MG immediate release tablet Take 1 tablet (5 mg total) by mouth every 6 (six) hours as needed for severe pain. 30 tablet 0   potassium chloride (KLOR-CON M) 10 MEQ tablet Take 1 tablet (10 mEq total) by mouth 2 (two) times daily. (Patient not taking: Reported on 08/21/2023) 14 tablet 0   prochlorperazine (COMPAZINE) 10 MG tablet Take 1 tablet (10 mg total) by mouth every 6 (six) hours as needed (Nausea or vomiting). 60 tablet 1   rivaroxaban (XARELTO) 20 MG TABS tablet Take 1 tablet (20 mg total) by mouth daily with supper. 90 tablet 3   silver sulfADIAZINE (SILVADENE) 1 % cream Apply 1 Application topically daily. (Patient not taking: Reported on 08/21/2023) 20 g 0   traMADol (ULTRAM) 50 MG tablet Take 1-2 tablets (50-100 mg total) by mouth every 6 (six) hours as needed. 120 tablet 0   zolpidem (AMBIEN) 10 MG tablet Take 1 tablet (10 mg total) by mouth at bedtime as needed. for sleep 30 tablet 0   No current facility-administered medications for this visit.    VITAL SIGNS: There were no vitals taken for this visit. There were no vitals filed for this visit.  Estimated body mass index is 27.53 kg/m as calculated from the following:   Height as of 08/21/23: 5\' 11"  (1.803 m).   Weight as of 08/21/23: 197 lb 6.4 oz (89.5 kg).  LABS: CBC:    Component Value Date/Time   WBC 8.2 08/21/2023 0908   WBC 5.2 06/05/2021 1130   HGB 13.8 08/21/2023 0908   HGB 11.2 (L) 11/07/2022 1649   HCT 40.2 08/21/2023 0908   HCT 32.3 (L) 11/07/2022 1649   PLT 138 (L) 08/21/2023 0908   PLT 127 (L) 11/07/2022 1649   MCV 96.9 08/21/2023 0908   MCV 101 (H) 11/07/2022 1649   NEUTROABS 5.2 08/21/2023 0908   LYMPHSABS 2.2 08/21/2023 0908   MONOABS 0.6 08/21/2023 0908   EOSABS 0.3 08/21/2023 0908   BASOSABS 0.1 08/21/2023 0908   Comprehensive Metabolic Panel:    Component Value Date/Time   NA 141 08/21/2023 0908   K 3.9 08/21/2023 0908   CL 108 08/21/2023 0908   CO2 28 08/21/2023 0908    BUN 18 08/21/2023 0908   CREATININE 1.07 08/21/2023 0908   CREATININE 0.88 06/05/2021 1130   GLUCOSE 123 (H) 08/21/2023 0908   CALCIUM 9.4 08/21/2023 0908   AST 17 08/21/2023 0908   ALT 19 08/21/2023 0908   ALKPHOS 101 08/21/2023 0908   BILITOT 0.6 08/21/2023 0908   PROT 7.2 08/21/2023 0908   ALBUMIN 4.0 08/21/2023 0908    RADIOGRAPHIC STUDIES: No results found.  PERFORMANCE STATUS (ECOG) : {CHL ONC ECOG HQ:[id number]}  Review of Systems Unless otherwise noted, a complete review of systems is negative.  Physical Exam General: NAD Cardiovascular: regular rate and rhythm Pulmonary: clear ant fields Abdomen: soft, nontender, + bowel sounds Extremities: no edema, no joint deformities Skin: no  rashes Neurological:  IMPRESSION: *** I introduced myself, Nijah Tejera RN, and Palliative's role in collaboration with the oncology team. Concept of Palliative Care was introduced as specialized medical care for people and their families living with serious illness.  It focuses on providing relief from the symptoms and stress of a serious illness.  The goal is to improve quality of life for both the patient and the family. Values and goals of care important to patient and family were attempted to be elicited.    We discussed *** current illness and what it means in the larger context of *** on-going co-morbidities. Natural disease trajectory and expectations were discussed.  I discussed the importance of continued conversation with family and their medical providers regarding overall plan of care and treatment options, ensuring decisions are within the context of the patients values and GOCs.  PLAN: Established therapeutic relationship. Education provided on palliative's role in collaboration with their Oncology/Radiation team. I will plan to see patient back in 2-4 weeks in collaboration to other oncology appointments.    Patient expressed understanding and was in agreement with this plan.  He also understands that He can call the clinic at any time with any questions, concerns, or complaints.   Thank you for your referral and allowing Palliative to assist in Mr. Keldan Achenbach's care.   Number and complexity of problems addressed: ***HIGH - 1 or more chronic illnesses with SEVERE exacerbation, progression, or side effects of treatment - advanced cancer, pain. Any controlled substances utilized were prescribed in the context of palliative care.   Visit consisted of counseling and education dealing with the complex and emotionally intense issues of symptom management and palliative care in the setting of serious and potentially life-threatening illness.Greater than 50%  of this time was spent counseling and coordinating care related to the above assessment and plan.  Signed by: Willette Alma, AGPCNP-BC Palliative Medicine Team/[facility]   *Please note that this is a verbal dictation therefore any spelling or grammatical errors are due to the "[employer]" system interpretation.

## 2023-09-02 MED FILL — Dexamethasone Sodium Phosphate Inj 100 MG/10ML: INTRAMUSCULAR | Qty: 1 | Status: AC

## 2023-09-02 MED FILL — Fosaprepitant Dimeglumine For IV Infusion 150 MG (Base Eq): INTRAVENOUS | Qty: 5 | Status: AC

## 2023-09-02 NOTE — Progress Notes (Unsigned)
Patient Care Team: Zachary Cowboy, MD as PCP - General (Internal Medicine) Pollyann Samples, NP as PCP - Hematology/Oncology (Nurse Practitioner) Thomasene Ripple, DO as PCP - Cardiology (Cardiology) Nadara Mustard, MD as Consulting Physician (Orthopedic Surgery) Laurey Morale, MD as Consulting Physician (Cardiology) Malachy Mood, MD as Consulting Physician (Oncology)  Clinic Day:  09/03/2023  Referring physician: Malachy Mood, MD  ASSESSMENT & PLAN:   Assessment & Plan: Pancreatic adenocarcinoma [facility]) [phone] with numerous small cavitary lesions in bilateral lungs, MMR normal  -diagnosed 07/2021, after incidental finding on CT scan for kidney stone, by colonoscopy/EUS. -Baseline CA 19-9 elevated to 273 on 07/03/21 -He began first-line mFOLFIRINOX on 08/13/21, but required dose reduction and move to every 3 weeks. -his CA 19-9 trended up but is now stable. -restaging CT CAP 08/12/22 showed: multiple new and enlarging pulmonary metastases; pancreatic mass grossly stable. Will repeat in 3 months. -we changed his irinotecan to liposomal on 09/05/22. He tolerated better and feels stronger. -Due to disease progression, his treatment was changed to gemcitabine and Abraxane every 2 weeks on 01/02/2023 -He tolerated the first two cycles chemotherapy very well with mild fatigue. Gemcitabine then increased to 1000mg /m2 from cycle 3 -Restaging CT scan 03/12/2023 showed stable disease overall. His primary pancreatic tumor is slightly increased in size, however his multiple cavitary lung nodules are slightly improved with thinner walls now.   -will continue current therapy.  He is overall tolerating well, but he has developed worsening fatigue lately.  Reduced gemcitabine dose to 800 mg/m again. -His tumor marker CA 19.9 continues trending down, which is a good clinical sign. -restaging CT 07/22/2023 showed moderate disease progression at the primary site and lung mets,  -Chemo changed to liposomal irinotecan  and 5-FU pump infusion every 2 weeks on 08/07/2023 with dose reduction due to fatigue  -He understands the goal of chemotherapy is palliative, to prolong his life -09/03/2023 - presents for Cycle 3 FOLFIRI with liposomal irinotecan    Plan: Labs reviewed  -CBC showing WBC 4.2; Hgb 12.3; Hct 34.4; Plt 83; Anc 2.7 -CMP - K 3.6; glucose 267; BUN 16; Creatinine 0.86; eGFR >60; Ca 8.7; LFTs normal.   -Ca 19.9 pending Chemotherapy dose reduced due to low platelet count. OK to proceed with treatment at new dose.  Recommend he take prescribed compazine to help with nausea and/or vomiting, the first 3 days after treatment. Taking this medication may also help control hiccups.  Recommended use of OTC imodium to control diarrhea. Take as directed on package. Advised that he contact the office if this does not help or improve symptoms. We would then be able to send prescription for lomotil. He voiced understanding and agreement.  Labs with follow up and treatment in 2 weeks. Reassess treatment plan at that time.   The patient understands the plans discussed today and is in agreement with them.  He knows to contact our office if he develops concerns prior to his next appointment.  I provided 30 minutes of face-to-face time during this encounter and > 50% was spent counseling as documented under my assessment and plan.    Carlean Jews, NP  [place] [facility] AT [facility] [address] [place] [address] Dept: [phone] Dept Fax: [phone]   No orders of the defined types were placed in this encounter.     CHIEF COMPLAINT:  CC: pancreatic adenocarcinoma   Current Treatment:  FOLFIRI with liposomal irinotecan Q 14 days  INTERVAL HISTORY:  Zachary Reid is here today for repeat clinical assessment. He denies fevers or chills. He denies pain. His appetite is fair. He denies neuropathy in hands or feet. His weight has been stable. Last seen by  Clayborn Heron, NP on 08/21/2023. States that each cycle of treatment has been different. With first cycle, he experienced nausea and  vomiting for three days. Also had moderate constipation for which he was able to relieve with stool softeners. Second cycle, he had pretty significant diarrhea, abdominal pain, gas, and bloating. He did find over the counter medication to treat multiple symptoms which did help. He did not use imodium or lomotil to control symptoms.  He and his wife also state that he had significant hiccups.   I have reviewed the past medical history, past surgical history, social history and family history with the patient and they are unchanged from previous note.  ALLERGIES:  is allergic to lipitor [atorvastatin].  MEDICATIONS:  Current Outpatient Medications  Medication Sig Dispense Refill   ALPRAZolam (XANAX) 0.5 MG tablet Take 1 tablet (0.5 mg total) by mouth 3 (three) times daily as needed for anxiety. 60 tablet 0   Cholecalciferol (VITAMIN D3) 125 MCG (5000 UT) CAPS Take 5,000 Units by mouth in the morning and at bedtime.     Continuous Blood Gluc Sensor (DEXCOM G7 SENSOR) MISC 1 Device by Does not apply route as directed. Change sensor every 10 days (Patient taking differently: 1 Device by Does not apply route as directed. Change sensor every 10 days Per patient on 12/02/22, he has not yet received glucose monitor.) 3 each 3   gabapentin (NEURONTIN) 600 MG tablet Take 1 tablet (600 mg total) by mouth 3 (three) times daily as needed (pain). 90 tablet 0   glucose blood test strip Use as instructed 100 each 12   hyoscyamine (LEVSIN SL) 0.125 MG SL tablet DISSOLVE ONE TABLET UNDER THE TONGUE 4 TIMES DAILY UP TO EVERY 4 HOURS AS NEEDED FOR NAUSEA, BLOATING, CRAMPING, OR DIARRHEA 100 tablet 0   insulin degludec (TRESIBA FLEXTOUCH) 100 UNIT/ML FlexTouch Pen INJECT 32 UNITS INTO THE SKIN DAILY. 15 mL 1   insulin lispro (HUMALOG KWIKPEN) 100 UNIT/ML KwikPen 10 TO 15 UNITS BEFORE MEALS AS  DIRECTED 15 mL 0   metoprolol succinate (TOPROL XL) 25 MG 24 hr tablet Take 0.5 tablets (12.5 mg total) by mouth daily. 45 tablet 3   mirtazapine (REMERON) 7.5 MG tablet Take 1 tablet (7.5 mg total) by mouth at bedtime. 90 tablet 1   ondansetron (ZOFRAN) 8 MG tablet Take 1 tablet (8 mg total) by mouth every 8 (eight) hours as needed for nausea or vomiting. 40 tablet 1   oxyCODONE (ROXICODONE) 5 MG immediate release tablet Take 1 tablet (5 mg total) by mouth every 6 (six) hours as needed for severe pain. 30 tablet 0   potassium chloride (KLOR-CON M) 10 MEQ tablet Take 1 tablet (10 mEq total) by mouth 2 (two) times daily. 14 tablet 0   rivaroxaban (XARELTO) 20 MG TABS tablet Take 1 tablet (20 mg total) by mouth daily with supper. 90 tablet 3   silver sulfADIAZINE (SILVADENE) 1 % cream Apply 1 Application topically daily. 20 g 0   traMADol (ULTRAM) 50 MG tablet Take 1-2 tablets (50-100 mg total) by mouth every 6 (six) hours as needed. 120 tablet 0   prochlorperazine (COMPAZINE) 10 MG tablet Take 1 tablet (10 mg total) by mouth every 6 (six) hours as needed (Nausea or vomiting). 60 tablet  1   zolpidem (AMBIEN) 10 MG tablet Take 1 tablet (10 mg total) by mouth at bedtime as needed. for sleep 30 tablet 1   No current facility-administered medications for this visit.   Facility-Administered Medications Ordered in Other Visits  Medication Dose Route Frequency Provider Last Rate Last Admin   fluorouracil (ADRUCIL) 3,100 mg in sodium chloride 0.9 % 88 mL chemo infusion  3,100 mg Intravenous 1 day or 1 dose Malachy Mood, MD        HISTORY OF PRESENT ILLNESS:   Oncology History Overview Note   Cancer Staging  Pancreatic adenocarcinoma [facility]) Staging form: Exocrine Pancreas, AJCC 8th Edition - Clinical stage from 07/26/2021: Stage IV (cT4, cN0, cM1) - Signed by Malachy Mood, MD on 07/28/2021    Pancreatic adenocarcinoma (HCC)  07/02/2021 Initial Diagnosis   Pancreatic adenocarcinoma (HCC)   07/26/2021 Cancer  Staging   Staging form: Exocrine Pancreas, AJCC 8th Edition - Clinical stage from 07/26/2021: Stage IV (cT4, cN0, cM1) - Signed by Malachy Mood, MD on 07/28/2021 Stage prefix: Initial diagnosis Total positive nodes: 0   08/13/2021 - 07/06/2022 Chemotherapy   Patient is on Treatment Plan : PANCREAS Modified FOLFIRINOX q14d x 4 cycles      Genetic Testing   [facility] CancerNext-Expanded results (77 genes) were negative. No pathogenic variants were identified. 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 The report date is 08/15/2021.    The CancerNext-Expanded gene panel offered by [facility] and includes sequencing, rearrangement, and RNA analysis for the following 77 genes: AIP, ALK, APC, ATM, AXIN2, BAP1, BARD1, BLM, BMPR1A, BRCA1, BRCA2, BRIP1, CDC73, CDH1, CDK4, CDKN1B, CDKN2A, CHEK2, CTNNA1, DICER1, FANCC, FH, FLCN, GALNT12, KIF1B, LZTR1, MAX, MEN1, MET, MLH1, MSH2, MSH3, MSH6, MUTYH, NBN, NF1, NF2, NTHL1, PALB2, PHOX2B, PMS2, POT1, PRKAR1A, PTCH1, PTEN, RAD51C, RAD51D, RB1, RECQL, RET, SDHA, SDHAF2, SDHB, SDHC, SDHD, SMAD4, SMARCA4, SMARCB1, SMARCE1, STK11, SUFU, TMEM127, TP53, TSC1, TSC2, VHL and XRCC2 (sequencing and deletion/duplication); EGFR, EGLN1, HOXB13, KIT, MITF, PDGFRA, POLD1, and POLE (sequencing only); EPCAM and GREM1 (deletion/duplication only).     08/13/2021 - 12/06/2022 Chemotherapy   Patient is on Treatment Plan : PANCREAS Modified FOLFIRINOX q14d x 4 cycles     10/25/2021 Imaging   EXAM: CT CHEST, ABDOMEN, AND PELVIS WITH CONTRAST  IMPRESSION: 1. Innumerable bilateral pulmonary nodules, many of which are cavitary, consistent with metastatic disease. These nodules show no substantial change in are minimally progressed in the interval. 2. Mix cystic and solid lesion in the head and body of the pancreas is similar to prior and also comparing back to MRI 07/02/2021. 3. Hepatic cysts. 4. 8 mm nonobstructing left renal stone. 5. Aortic Atherosclerosis  (ICD10-I70.0).   01/28/2022 Imaging   EXAM: CT CHEST, ABDOMEN, AND PELVIS WITH CONTRAST  IMPRESSION: 1. Innumerable bilateral small solid and cavitary pulmonary nodules, some of the cavitary nodules are slightly less thick-walled when compared with prior exam. 2. Mixed cystic and solid lesion in the head and body of the pancreas is similar to prior exam. 3. Nonobstructing left renal stone. 4.  Aortic Atherosclerosis (ICD10-I70.0).   11/29/2022 Imaging    IMPRESSION: 1. No significant change in primary pancreatic mass and adjacent cystic component. Unchanged appearance of soft tissue extending to involve the adjacent celiac axis, superior mesenteric artery origin, and portal confluence. 2. 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 3. Innumerable small pulmonary nodules throughout the lungs, the majority of which are cavitary.  Although interval change is difficult to appreciate for the majority of these nodules due to small size, at least some of these are slightly enlarged, consistent with worsened pulmonary metastatic disease. 4. Newly enlarged left retroperitoneal lymph nodes, which may reflect nodal metastatic disease or perhaps reactive to left hydronephrosis. Attention on follow-up. 5. A sizable calculus previously seen in the left renal pelvis has migrated to the middle third of the left ureter, with placement of a double-J left ureteral stent, with formed pigtails in the left renal pelvis and urinary bladder. Moderate left hydronephrosis and proximal hydroureter. 6. Coronary artery disease.   Aortic Atherosclerosis (ICD10-I70.0).   01/02/2023 - 06/26/2023 Chemotherapy   Patient is on Treatment Plan : PANCREATIC Abraxane D1,8,15 + Gemcitabine D1,8,15 q28d     03/12/2023 Imaging    IMPRESSION: 1. Primary pancreatic mass with adjacent cystic component is slightly increased in size when compared with  the prior exam. Similar soft tissue extending involve the celiac axis superior mesenteric artery origin and portal confluence. 2. Innumerable bilateral pulmonary nodules, some of which are cavitary, unchanged when compared with the prior. 3. Left retroperitoneal lymph nodes are decreased in size, likely resolving reactive adenopathy 4. Interval removal of left ureter stent.  No hydronephrosis. 5. coronary artery disease and aortic Atherosclerosis (ICD10-I70.0).   07/22/2023 Imaging   CT CAP W CONTRAST   IMPRESSION: 1. Today's study demonstrates progression of disease as evidenced by enlargement of the primary pancreatic mass, mild enlargement of numerous prominent borderline enlarged and mildly enlarged upper abdominal and retroperitoneal lymph nodes, and increased number and size of numerous metastatic nodules scattered throughout the lungs bilaterally, as detailed above. 2. 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 Main portal vein remains patent at this time. 3. 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 4. Nonobstructive nephrolithiasis in the kidneys bilaterally measuring 2-4 mm in size. 5. Aortic atherosclerosis, in addition to left main and three-vessel coronary artery disease. 6. Additional incidental findings, as above.   08/07/2023 -  Chemotherapy   Patient is on Treatment Plan : PANCREAS Liposomal Irinotecan + Leucovorin + 5-FU IVCI q14d         REVIEW OF SYSTEMS:   Constitutional: Denies fevers, chills or abnormal weight loss Eyes: Denies blurriness of vision Ears, nose, mouth, throat, and face: Denies mucositis or sore throat Respiratory: Denies cough, dyspnea or wheezes Cardiovascular: Denies palpitation, chest discomfort or lower extremity swelling Gastrointestinal:  several days of abdominal pain, gas, bloating, and diarrhea. Has  gradually improved.  Skin: Denies abnormal skin rashes Lymphatics: Denies new lymphadenopathy or easy bruising Neurological:Denies numbness, tingling or new weaknesses Behavioral/Psych: Mood is stable, no new changes  All other systems were reviewed with the patient and are negative.   VITALS:   Today's Vitals   09/03/23 1013 09/03/23 1023  BP: 133/82   Pulse: 83   Resp: 18   Temp: 97.7 F (36.5 C)   TempSrc: Temporal   SpO2: 99%   Weight: 196 lb 6.4 oz (89.1 kg)   Height: 5\' 11"  (1.803 m)   PainSc:  4    Body mass index is 27.39 kg/m.   Wt Readings from Last 3 Encounters:  09/03/23 196 lb 6.4 oz (89.1 kg)  08/21/23 197 lb 6.4 oz (89.5 kg)  08/07/23 198 lb 14.4 oz (90.2 kg)    Body mass index is 27.39 kg/m.  Performance status (ECOG): 1 - Symptomatic but completely  ambulatory  PHYSICAL EXAM:   GENERAL:alert, no distress and comfortable SKIN: skin color, texture, turgor are normal, no rashes or significant lesions EYES: normal, Conjunctiva are pink and non-injected, sclera clear OROPHARYNX:no exudate, no erythema and lips, buccal mucosa, and tongue normal  NECK: supple, thyroid normal size, non-tender, without nodularity LYMPH:  no palpable lymphadenopathy in the cervical, axillary or inguinal LUNGS: clear to auscultation and percussion with normal breathing effort HEART: regular rate & rhythm and no murmurs and no lower extremity edema ABDOMEN:abdomen soft, non-tender and normal bowel sounds Musculoskeletal:no cyanosis of digits and no clubbing  NEURO: alert & oriented x 3 with fluent speech, no focal motor/sensory deficits  LABORATORY DATA:  I have reviewed the data as listed    Component Value Date/Time   NA 138 09/03/2023 0954   K 3.6 09/03/2023 0954   CL 106 09/03/2023 0954   CO2 23 09/03/2023 0954   GLUCOSE 267 (H) 09/03/2023 0954   BUN 10 09/03/2023 0954   CREATININE 0.86 09/03/2023 0954   CREATININE 0.88 06/05/2021 1130   CALCIUM 8.7 (L) 09/03/2023  0954   PROT 6.5 09/03/2023 0954   ALBUMIN 3.6 09/03/2023 0954   AST 22 09/03/2023 0954   ALT 26 09/03/2023 0954   ALKPHOS 81 09/03/2023 0954   BILITOT 0.8 09/03/2023 0954   GFRNONAA >60 09/03/2023 0954   GFRNONAA 96 03/05/2021 0915   GFRAA 112 03/05/2021 0915     Lab Results  Component Value Date   WBC 4.2 09/03/2023   NEUTROABS 2.7 09/03/2023   HGB 12.3 (L) 09/03/2023   HCT 34.4 (L) 09/03/2023   MCV 95.8 09/03/2023   PLT 83 (L) 09/03/2023   Addendum I have seen the patient, examined him. I agree with the assessment and and plan and have edited the notes.   Zachary Reid is tolerating chemotherapy moderately well, we reviewed the side effect management.  Due to his worsening thrombocytopenia, I will reduce his 5-FU pump infusion dose.  Will continue chemotherapy every 2 weeks, plan to repeat CT scan in December.  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  All questions were answered, I spent a total of 25 minutes for his visit today, more than 50% time on face-to-face counseling.  Malachy Mood MD 09/03/2023

## 2023-09-02 NOTE — Assessment & Plan Note (Signed)
[id number] with numerous small cavitary lesions in bilateral lungs, MMR normal  -diagnosed 07/2021, after incidental finding on CT scan for kidney stone, by colonoscopy/EUS. -Baseline CA 19-9 elevated to 273 on 07/03/21 -He began first-line mFOLFIRINOX on 08/13/21, but required dose reduction and move to every 3 weeks. -his CA 19-9 trended up but is now stable. -restaging CT CAP 08/12/22 showed: multiple new and enlarging pulmonary metastases; pancreatic mass grossly stable. Will repeat in 3 months. -we changed his irinotecan to liposomal on 09/05/22. He tolerated better and feels stronger. -Due to disease progression, his treatment was changed to gemcitabine and Abraxane every 2 weeks on 01/02/2023 -He tolerated the first two cycles chemotherapy very well with mild fatigue. Gemcitabine then increased to 1000mg /m2 from cycle 3 -Restaging CT scan 03/12/2023 showed stable disease overall. His primary pancreatic tumor is slightly increased in size, however his multiple cavitary lung nodules are slightly improved with thinner walls now.   -will continue current therapy.  He is overall tolerating well, but he has developed worsening fatigue lately.  Reduced gemcitabine dose to 800 mg/m again. -His tumor marker CA 19.9 continues trending down, which is a good clinical sign. -restaging CT 07/22/2023 showed moderate disease progression at the primary site and lung mets,  -Chemo changed to liposomal irinotecan and 5-FU pump infusion every 2 weeks on 08/07/2023 with dose reduction due to fatigue  -He understands the goal of chemotherapy is palliative, to prolong his life -09/03/2023 - presents for Cycle 3 FOLFIRI with liposomal irinotecan

## 2023-09-03 ENCOUNTER — Inpatient Hospital Stay: Payer: Medicare Other | Admitting: Nurse Practitioner

## 2023-09-03 ENCOUNTER — Inpatient Hospital Stay: Payer: Medicare Other

## 2023-09-03 ENCOUNTER — Inpatient Hospital Stay (HOSPITAL_BASED_OUTPATIENT_CLINIC_OR_DEPARTMENT_OTHER): Payer: Medicare Other | Admitting: Nurse Practitioner

## 2023-09-03 ENCOUNTER — Other Ambulatory Visit: Payer: Self-pay

## 2023-09-03 ENCOUNTER — Encounter: Payer: Self-pay | Admitting: Hematology

## 2023-09-03 VITALS — BP 133/82 | HR 83 | Temp 97.7°F | Resp 18 | Ht 71.0 in | Wt 196.4 lb

## 2023-09-03 DIAGNOSIS — C259 Malignant neoplasm of pancreas, unspecified: Secondary | ICD-10-CM

## 2023-09-03 DIAGNOSIS — Z95828 Presence of other vascular implants and grafts: Secondary | ICD-10-CM

## 2023-09-03 DIAGNOSIS — E860 Dehydration: Secondary | ICD-10-CM

## 2023-09-03 DIAGNOSIS — Z5111 Encounter for antineoplastic chemotherapy: Secondary | ICD-10-CM | POA: Diagnosis not present

## 2023-09-03 LAB — CBC WITH DIFFERENTIAL (CANCER CENTER ONLY)
Abs Immature Granulocytes: 0 10*3/uL (ref 0.00–0.07)
Basophils Absolute: 0 10*3/uL (ref 0.0–0.1)
Basophils Relative: 1 %
Eosinophils Absolute: 0.1 10*3/uL (ref 0.0–0.5)
Eosinophils Relative: 3 %
HCT: 34.4 % — ABNORMAL LOW (ref 39.0–52.0)
Hemoglobin: 12.3 g/dL — ABNORMAL LOW (ref 13.0–17.0)
Immature Granulocytes: 0 %
Lymphocytes Relative: 26 %
Lymphs Abs: 1.1 10*3/uL (ref 0.7–4.0)
MCH: 34.3 pg — ABNORMAL HIGH (ref 26.0–34.0)
MCHC: 35.8 g/dL (ref 30.0–36.0)
MCV: 95.8 fL (ref 80.0–100.0)
Monocytes Absolute: 0.3 10*3/uL (ref 0.1–1.0)
Monocytes Relative: 7 %
Neutro Abs: 2.7 10*3/uL (ref 1.7–7.7)
Neutrophils Relative %: 63 %
Platelet Count: 83 10*3/uL — ABNORMAL LOW (ref 150–400)
RBC: 3.59 MIL/uL — ABNORMAL LOW (ref 4.22–5.81)
RDW: 13.6 % (ref 11.5–15.5)
WBC Count: 4.2 10*3/uL (ref 4.0–10.5)
nRBC: 0 % (ref 0.0–0.2)

## 2023-09-03 LAB — CMP (CANCER CENTER ONLY)
ALT: 26 U/L (ref 0–44)
AST: 22 U/L (ref 15–41)
Albumin: 3.6 g/dL (ref 3.5–5.0)
Alkaline Phosphatase: 81 U/L (ref 38–126)
Anion gap: 9 (ref 5–15)
BUN: 10 mg/dL (ref 8–23)
CO2: 23 mmol/L (ref 22–32)
Calcium: 8.7 mg/dL — ABNORMAL LOW (ref 8.9–10.3)
Chloride: 106 mmol/L (ref 98–111)
Creatinine: 0.86 mg/dL (ref 0.61–1.24)
GFR, Estimated: >60 mL/min (ref >60)
Glucose, Bld: 267 mg/dL — ABNORMAL HIGH (ref 70–99)
Potassium: 3.6 mmol/L (ref 3.5–5.1)
Sodium: 138 mmol/L (ref 135–145)
Total Bilirubin: 0.8 mg/dL (ref 0.3–1.2)
Total Protein: 6.5 g/dL (ref 6.5–8.1)

## 2023-09-03 MED ORDER — SODIUM CHLORIDE 0.9 % IV SOLN
400.0000 mg/m2 | Freq: Once | INTRAVENOUS | Status: AC
  Administered 2023-09-03: 852 mg via INTRAVENOUS
  Filled 2023-09-03: qty 42.6

## 2023-09-03 MED ORDER — PALONOSETRON HCL INJECTION 0.25 MG/5ML
0.2500 mg | Freq: Once | INTRAVENOUS | Status: AC
  Administered 2023-09-03: 0.25 mg via INTRAVENOUS
  Filled 2023-09-03: qty 5

## 2023-09-03 MED ORDER — ATROPINE SULFATE 1 MG/ML IV SOLN
0.5000 mg | Freq: Once | INTRAVENOUS | Status: AC | PRN
  Administered 2023-09-03: 0.5 mg via INTRAVENOUS
  Filled 2023-09-03: qty 1

## 2023-09-03 MED ORDER — SODIUM CHLORIDE 0.9 % IV SOLN
50.0000 mg/m2 | Freq: Once | INTRAVENOUS | Status: AC
  Administered 2023-09-03: 107.5 mg via INTRAVENOUS
  Filled 2023-09-03: qty 25

## 2023-09-03 MED ORDER — SODIUM CHLORIDE 0.9 % IV SOLN
1500.0000 mg/m2 | INTRAVENOUS | Status: DC
  Filled 2023-09-03: qty 70

## 2023-09-03 MED ORDER — SODIUM CHLORIDE 0.9 % IV SOLN
150.0000 mg | Freq: Once | INTRAVENOUS | Status: AC
  Administered 2023-09-03: 150 mg via INTRAVENOUS
  Filled 2023-09-03: qty 150

## 2023-09-03 MED ORDER — ZOLPIDEM TARTRATE 10 MG PO TABS: 10.0000 mg | ORAL_TABLET | Freq: Every evening | ORAL | 1 refills | Status: DC | PRN

## 2023-09-03 MED ORDER — SODIUM CHLORIDE 0.9 % IV SOLN: Freq: Once | INTRAVENOUS | Status: AC

## 2023-09-03 MED ORDER — PROCHLORPERAZINE MALEATE 10 MG PO TABS: 10.0000 mg | ORAL_TABLET | Freq: Four times a day (QID) | ORAL | 1 refills | Status: DC | PRN

## 2023-09-03 MED ORDER — SODIUM CHLORIDE 0.9 % IV SOLN
10.0000 mg | Freq: Once | INTRAVENOUS | Status: AC
  Administered 2023-09-03: 10 mg via INTRAVENOUS
  Filled 2023-09-03: qty 10

## 2023-09-03 MED ORDER — SODIUM CHLORIDE 0.9 % IV SOLN
3100.0000 mg | INTRAVENOUS | Status: DC
  Administered 2023-09-03: 3100 mg via INTRAVENOUS
  Filled 2023-09-03: qty 62

## 2023-09-03 MED ORDER — SODIUM CHLORIDE 0.9% FLUSH
10.0000 mL | Freq: Once | INTRAVENOUS | Status: AC
  Administered 2023-09-03: 10 mL

## 2023-09-03 NOTE — Progress Notes (Signed)
Decrease 5FU CIV to 1500mg /m2 = 3100mg  per MD.  Richardean Sale, RPH, BCPS, BCOP 09/03/2023 3:34 PM

## 2023-09-03 NOTE — Patient Instructions (Signed)
[place] [facility]  Discharge Instructions: Thank you for choosing [facility] to provide your oncology and hematology care.   If you have a lab appointment with the Cancer Center, please go directly to the Cancer Center and check in at the registration area.   Wear comfortable clothing and clothing appropriate for easy access to any Portacath or PICC line.   We strive to give you quality time with your provider. You may need to reschedule your appointment if you arrive late (15 or more minutes).  Arriving late affects you and other patients whose appointments are after yours.  Also, if you miss three or more appointments without notifying the office, you may be dismissed from the clinic at the provider's discretion.      For prescription refill requests, have your pharmacy contact our office and allow 72 hours for refills to be completed.    Today you received the following chemotherapy and/or immunotherapy agents: Irinotecan liposome, Leucovorin, 5FU      To help prevent nausea and vomiting after your treatment, we encourage you to take your nausea medication as directed.  BELOW ARE SYMPTOMS THAT SHOULD BE REPORTED IMMEDIATELY: *FEVER GREATER THAN 100.4 F (38 C) OR HIGHER *CHILLS OR SWEATING *NAUSEA AND VOMITING THAT IS NOT CONTROLLED WITH YOUR NAUSEA MEDICATION *UNUSUAL SHORTNESS OF BREATH *UNUSUAL BRUISING OR BLEEDING *URINARY PROBLEMS (pain or burning when urinating, or frequent urination) *BOWEL PROBLEMS (unusual diarrhea, constipation, pain near the anus) TENDERNESS IN MOUTH AND THROAT WITH OR WITHOUT PRESENCE OF ULCERS (sore throat, sores in mouth, or a toothache) UNUSUAL RASH, SWELLING OR PAIN  UNUSUAL VAGINAL DISCHARGE OR ITCHING   Items with * indicate a potential emergency and should be followed up as soon as possible or go to the Emergency Department if any problems should occur.  Please show the CHEMOTHERAPY ALERT CARD or  IMMUNOTHERAPY ALERT CARD at check-in to the Emergency Department and triage nurse.  Should you have questions after your visit or need to cancel or reschedule your appointment, please contact [facility] [facility]  Dept: [phone]  and follow the prompts.  Office hours are 8:00 a.m. to 4:30 p.m. Monday - Friday. Please note that voicemails left after 4:00 p.m. may not be returned until the following business day.  We are closed weekends and major holidays. You have access to a nurse at all times for urgent questions. Please call the main number to the clinic Dept: [phone] and follow the prompts.   For any non-urgent questions, you may also contact your provider using MyChart. We now offer e-Visits for anyone 37 and older to request care online for non-urgent symptoms. For details visit [email or web address].   Also download the MyChart app! Go to the app store, search "MyChart", open the app, select [facility], and log in with your MyChart username and password.

## 2023-09-04 LAB — CANCER ANTIGEN 19-9: CA 19-9: 623 U/mL — ABNORMAL HIGH (ref 0–35)

## 2023-09-04 NOTE — Progress Notes (Signed)
Palliative Medicine [facility]  [phone] [phone]   Name: Zachary Reid Date: 09/04/2023 MRN: [id number]  DOB: 1956/01/10  Patient Care Team: Lucky Cowboy, MD as PCP - General (Internal Medicine) Pollyann Samples, NP as PCP - Hematology/Oncology (Nurse Practitioner) Thomasene Ripple, DO as PCP - Cardiology (Cardiology) Nadara Mustard, MD as Consulting Physician (Orthopedic Surgery) Laurey Morale, MD as Consulting Physician (Cardiology) Malachy Mood, MD as Consulting Physician (Oncology)    REASON FOR CONSULTATION: Zachary Reid is a 67 y.o. male with oncologic medical history including pancreatic adenocarcinoma (06/2021), HLD, diabetes, CKD, HTN, and anxiety. Palliative ask to see for symptom management and goals of care.    SOCIAL HISTORY:     reports that he has never smoked. He has never used smokeless tobacco. He reports that he does not drink alcohol and does not use drugs.  ADVANCE DIRECTIVES:  Advanced directives on file naming wife, Zachary Reid, as patient healthcare power of attorney, and daughter, Zachary Reid as Counsellor.  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 tobb;  newly dx ED 10-31-2022 w/ RVR  in setting flank pain (kidney stone)  --  office note in epic 11-07-2022 normal ETT 11-13-2022, echo 11-07-2022 ef 65-70% w/ mild LVH, mild  MR,  zio monitor not completed,  started on toprol and xarelto daily   Personal history of chemotherapy    Receiving chemotherapy for metastatic pancreatic cancer. Most recent infusion (as of 12/02/22) was on 11/15/22.   Port-A-Cath in place 08/10/2021   Type 2 diabetes mellitus treated with insulin [facility])    endocrinologist--- dr Lucianne Muss   Vitamin D deficiency    takes Vit D supplements   Wears contact lenses     PAST SURGICAL HISTORY:  Past Surgical History:  Procedure Laterality Date   APPENDECTOMY  1988   BIOPSY  07/26/2021   Procedure: BIOPSY;  Surgeon: Lemar Lofty., MD;  Location: [facility] ENDOSCOPY;  Service: Gastroenterology;;   COLONOSCOPY WITH PROPOFOL N/A 07/26/2021   Procedure: COLONOSCOPY WITH PROPOFOL;  Surgeon: Lemar Lofty., MD;  Location: [facility] ENDOSCOPY;  Service: Gastroenterology;  Laterality: N/A;   CYSTOSCOPY WITH STENT PLACEMENT Left 10/31/2022   Procedure: CYSTOSCOPY WITH STENT PLACEMENT;  Surgeon: Belva Agee, MD;  Location: [facility] ORS;  Service: Urology;  Laterality: Left;   CYSTOSCOPY/URETEROSCOPY/HOLMIUM LASER/STENT PLACEMENT Left 12/03/2022   Procedure: CYSTOSCOPY/LEFT URETEROSCOPY/HOLMIUM LASER/STENT EXCHANGE;  Surgeon: Belva Agee, MD;  Location: [facility];  Service: Urology;  Laterality: Left;  1 HR FOR CASE   ESOPHAGOGASTRODUODENOSCOPY (EGD) WITH PROPOFOL N/A 07/26/2021   Procedure: ESOPHAGOGASTRODUODENOSCOPY (EGD) WITH PROPOFOL;  Surgeon: Meridee Score Netty Starring., MD;  Location: [facility] ENDOSCOPY;  Service: Gastroenterology;  Laterality: N/A;   EUS N/A 07/26/2021   Procedure: UPPER ENDOSCOPIC ULTRASOUND (EUS) RADIAL;  Surgeon: Meridee Score Netty Starring., MD;  Location: [facility]  ENDOSCOPY;  Service: Gastroenterology;  Laterality: N/A;   FINE NEEDLE ASPIRATION  07/26/2021   Procedure: FINE NEEDLE ASPIRATION (FNA) LINEAR;  Surgeon: Meridee Score Netty Starring., MD;  Location: [facility] ENDOSCOPY;  Service: Gastroenterology;;   IR IMAGING GUIDED PORT  INSERTION  08/10/2021   POLYPECTOMY  07/26/2021   Procedure: POLYPECTOMY;  Surgeon: Meridee Score Netty Starring., MD;  Location: [facility] ENDOSCOPY;  Service: Gastroenterology;;   SHOULDER SURGERY Right    as a teenager    HEMATOLOGY/ONCOLOGY HISTORY:  Oncology History Overview Note   Cancer Staging  Pancreatic adenocarcinoma [facility]) Staging form: Exocrine Pancreas, AJCC 8th Edition - Clinical stage from 07/26/2021: Stage IV (cT4, cN0, cM1) - Signed by Malachy Mood, MD on 07/28/2021    Pancreatic adenocarcinoma (HCC)  07/02/2021 Initial Diagnosis   Pancreatic adenocarcinoma (HCC)   07/26/2021 Cancer Staging   Staging form: Exocrine Pancreas, AJCC 8th Edition - Clinical stage from 07/26/2021: Stage IV (cT4, cN0, cM1) - Signed by Malachy Mood, MD on 07/28/2021 Stage prefix: Initial diagnosis Total positive nodes: 0   08/13/2021 - 07/06/2022 Chemotherapy   Patient is on Treatment Plan : PANCREAS Modified FOLFIRINOX q14d x 4 cycles      Genetic Testing   [facility] CancerNext-Expanded results (77 genes) were negative. No pathogenic variants were identified. 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 The report date is 08/15/2021.    The CancerNext-Expanded gene panel offered by [facility] and includes sequencing, rearrangement, and RNA analysis for the following 77 genes: AIP, ALK, APC, ATM, AXIN2, BAP1, BARD1, BLM, BMPR1A, BRCA1, BRCA2, BRIP1, CDC73, CDH1, CDK4, CDKN1B, CDKN2A, CHEK2, CTNNA1, DICER1, FANCC, FH, FLCN, GALNT12, KIF1B, LZTR1, MAX, MEN1, MET, MLH1, MSH2, MSH3, MSH6, MUTYH, NBN, NF1, NF2, NTHL1, PALB2, PHOX2B, PMS2, POT1, PRKAR1A, PTCH1, PTEN, RAD51C, RAD51D, RB1, RECQL, RET, SDHA, SDHAF2, SDHB, SDHC, SDHD, SMAD4, SMARCA4, SMARCB1, SMARCE1, STK11, SUFU, TMEM127, TP53, TSC1, TSC2, VHL and XRCC2 (sequencing and deletion/duplication); EGFR, EGLN1, HOXB13, KIT, MITF, PDGFRA, POLD1, and POLE (sequencing only); EPCAM and GREM1 (deletion/duplication only).     08/13/2021 -  12/06/2022 Chemotherapy   Patient is on Treatment Plan : PANCREAS Modified FOLFIRINOX q14d x 4 cycles     10/25/2021 Imaging   EXAM: CT CHEST, ABDOMEN, AND PELVIS WITH CONTRAST  IMPRESSION: 1. Innumerable bilateral pulmonary nodules, many of which are cavitary, consistent with metastatic disease. These nodules show no substantial change in are minimally progressed in the interval. 2. Mix cystic and solid lesion in the head and body of the pancreas is similar to prior and also comparing back to MRI 07/02/2021. 3. Hepatic cysts. 4. 8 mm nonobstructing left renal stone. 5. Aortic Atherosclerosis (ICD10-I70.0).   01/28/2022 Imaging   EXAM: CT CHEST, ABDOMEN, AND PELVIS WITH CONTRAST  IMPRESSION: 1. Innumerable bilateral small solid and cavitary pulmonary nodules, some of the cavitary nodules are slightly less thick-walled when compared with prior exam. 2. Mixed cystic and solid lesion in the head and body of the pancreas is similar to prior exam. 3. Nonobstructing left renal stone. 4.  Aortic Atherosclerosis (ICD10-I70.0).   11/29/2022 Imaging    IMPRESSION: 1. No significant change in primary pancreatic mass and adjacent cystic component. Unchanged appearance of soft tissue extending to involve the adjacent celiac axis, superior mesenteric artery origin, and portal confluence. 2. 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 3. Innumerable small pulmonary nodules throughout the lungs, the majority of which are cavitary. Although interval change is difficult to appreciate for the  majority of these nodules due to small size, at least some of these are slightly enlarged, consistent with worsened pulmonary metastatic disease. 4. Newly enlarged left retroperitoneal lymph nodes, which may reflect nodal metastatic disease or perhaps reactive to left hydronephrosis. Attention on follow-up. 5. A sizable calculus  previously seen in the left renal pelvis has migrated to the middle third of the left ureter, with placement of a double-J left ureteral stent, with formed pigtails in the left renal pelvis and urinary bladder. Moderate left hydronephrosis and proximal hydroureter. 6. Coronary artery disease.   Aortic Atherosclerosis (ICD10-I70.0).   01/02/2023 - 06/26/2023 Chemotherapy   Patient is on Treatment Plan : PANCREATIC Abraxane D1,8,15 + Gemcitabine D1,8,15 q28d     03/12/2023 Imaging    IMPRESSION: 1. Primary pancreatic mass with adjacent cystic component is slightly increased in size when compared with the prior exam. Similar soft tissue extending involve the celiac axis superior mesenteric artery origin and portal confluence. 2. Innumerable bilateral pulmonary nodules, some of which are cavitary, unchanged when compared with the prior. 3. Left retroperitoneal lymph nodes are decreased in size, likely resolving reactive adenopathy 4. Interval removal of left ureter stent.  No hydronephrosis. 5. coronary artery disease and aortic Atherosclerosis (ICD10-I70.0).   07/22/2023 Imaging   CT CAP W CONTRAST   IMPRESSION: 1. Today's study demonstrates progression of disease as evidenced by enlargement of the primary pancreatic mass, mild enlargement of numerous prominent borderline enlarged and mildly enlarged upper abdominal and retroperitoneal lymph nodes, and increased number and size of numerous metastatic nodules scattered throughout the lungs bilaterally, as detailed above. 2. 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 Main portal vein remains patent at this time. 3. 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 4. Nonobstructive nephrolithiasis in the kidneys bilaterally measuring 2-4 mm in size. 5. Aortic atherosclerosis, in addition to left main and  three-vessel coronary artery disease. 6. Additional incidental findings, as above.   08/07/2023 -  Chemotherapy   Patient is on Treatment Plan : PANCREAS Liposomal Irinotecan + Leucovorin + 5-FU IVCI q14d       ALLERGIES:  is allergic to lipitor [atorvastatin].  MEDICATIONS:  Current Outpatient Medications  Medication Sig Dispense Refill   ALPRAZolam (XANAX) 0.5 MG tablet Take 1 tablet (0.5 mg total) by mouth 3 (three) times daily as needed for anxiety. 60 tablet 0   Cholecalciferol (VITAMIN D3) 125 MCG (5000 UT) CAPS Take 5,000 Units by mouth in the morning and at bedtime.     Continuous Blood Gluc Sensor (DEXCOM G7 SENSOR) MISC 1 Device by Does not apply route as directed. Change sensor every 10 days (Patient taking differently: 1 Device by Does not apply route as directed. Change sensor every 10 days Per patient on 12/02/22, he has not yet received glucose monitor.) 3 each 3   gabapentin (NEURONTIN) 600 MG tablet Take 1 tablet (600 mg total) by mouth 3 (three) times daily as needed (pain). 90 tablet 0   glucose blood test strip Use as instructed 100 each 12   hyoscyamine (LEVSIN SL) 0.125 MG SL tablet DISSOLVE ONE TABLET UNDER THE TONGUE 4 TIMES DAILY UP TO EVERY 4 HOURS AS NEEDED FOR NAUSEA, BLOATING, CRAMPING, OR DIARRHEA 100 tablet 0   insulin degludec (TRESIBA FLEXTOUCH) 100 UNIT/ML FlexTouch Pen INJECT 32 UNITS INTO THE SKIN DAILY. 15 mL 1   insulin lispro (HUMALOG KWIKPEN) 100 UNIT/ML KwikPen 10 TO 15 UNITS  BEFORE MEALS AS DIRECTED 15 mL 0   metoprolol succinate (TOPROL XL) 25 MG 24 hr tablet Take 0.5 tablets (12.5 mg total) by mouth daily. 45 tablet 3   mirtazapine (REMERON) 7.5 MG tablet Take 1 tablet (7.5 mg total) by mouth at bedtime. 90 tablet 1   ondansetron (ZOFRAN) 8 MG tablet Take 1 tablet (8 mg total) by mouth every 8 (eight) hours as needed for nausea or vomiting. 40 tablet 1   oxyCODONE (ROXICODONE) 5 MG immediate release tablet Take 1 tablet (5 mg total) by mouth every 6  (six) hours as needed for severe pain. 30 tablet 0   potassium chloride (KLOR-CON M) 10 MEQ tablet Take 1 tablet (10 mEq total) by mouth 2 (two) times daily. 14 tablet 0   prochlorperazine (COMPAZINE) 10 MG tablet Take 1 tablet (10 mg total) by mouth every 6 (six) hours as needed (Nausea or vomiting). 60 tablet 1   rivaroxaban (XARELTO) 20 MG TABS tablet Take 1 tablet (20 mg total) by mouth daily with supper. 90 tablet 3   silver sulfADIAZINE (SILVADENE) 1 % cream Apply 1 Application topically daily. 20 g 0   traMADol (ULTRAM) 50 MG tablet Take 1-2 tablets (50-100 mg total) by mouth every 6 (six) hours as needed. 120 tablet 0   zolpidem (AMBIEN) 10 MG tablet Take 1 tablet (10 mg total) by mouth at bedtime as needed. for sleep 30 tablet 1   No current facility-administered medications for this visit.    VITAL SIGNS: There were no vitals taken for this visit. There were no vitals filed for this visit.  Estimated body mass index is 27.39 kg/m as calculated from the following:   Height as of 09/03/23: 5\' 11"  (1.803 m).   Weight as of 09/03/23: 196 lb 6.4 oz (89.1 kg).  LABS: CBC:    Component Value Date/Time   WBC 4.2 09/03/2023 0954   WBC 5.2 06/05/2021 1130   HGB 12.3 (L) 09/03/2023 0954   HGB 11.2 (L) 11/07/2022 1649   HCT 34.4 (L) 09/03/2023 0954   HCT 32.3 (L) 11/07/2022 1649   PLT 83 (L) 09/03/2023 0954   PLT 127 (L) 11/07/2022 1649   MCV 95.8 09/03/2023 0954   MCV 101 (H) 11/07/2022 1649   NEUTROABS 2.7 09/03/2023 0954   LYMPHSABS 1.1 09/03/2023 0954   MONOABS 0.3 09/03/2023 0954   EOSABS 0.1 09/03/2023 0954   BASOSABS 0.0 09/03/2023 0954   Comprehensive Metabolic Panel:    Component Value Date/Time   NA 138 09/03/2023 0954   K 3.6 09/03/2023 0954   CL 106 09/03/2023 0954   CO2 23 09/03/2023 0954   BUN 10 09/03/2023 0954   CREATININE 0.86 09/03/2023 0954   CREATININE 0.88 06/05/2021 1130   GLUCOSE 267 (H) 09/03/2023 0954   CALCIUM 8.7 (L) 09/03/2023 0954   AST 22  09/03/2023 0954   ALT 26 09/03/2023 0954   ALKPHOS 81 09/03/2023 0954   BILITOT 0.8 09/03/2023 0954   PROT 6.5 09/03/2023 0954   ALBUMIN 3.6 09/03/2023 0954    RADIOGRAPHIC STUDIES: No results found.  PERFORMANCE STATUS (ECOG) : 1 - Symptomatic but completely ambulatory  Review of Systems  Constitutional:  Positive for appetite change and fatigue.  Gastrointestinal:  Positive for abdominal pain.  Unless otherwise noted, a complete review of systems is negative.  Physical Exam General: NAD Cardiovascular: regular rate and rhythm Pulmonary: normal breathing pattern Extremities: no edema, no joint deformities Skin: no rashes Neurological:alert and oriented x3  IMPRESSION: This  is my initial visit with Mr. Chuang.  No acute distress noted.  No family present.  Patient is alert and able to engage appropriately in discussions.  I introduced myself, Maygan RN, and Palliative's role in collaboration with the oncology team. Concept of Palliative Care was introduced as specialized medical care for people and their families living with serious illness.  It focuses on providing relief from the symptoms and stress of a serious illness.  The goal is to improve quality of life for both the patient and the family. Values and goals of care important to patient and family were attempted to be elicited.    Mr. Cure lives in the home with his wife and son. 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 He presents with a complex symptom profile. Reports he has been on multiple treatment regimens over the past two years, with the current regimen being his fourth. The patient reports a history of significant weight loss, from 175 to 142 pounds, during the initial treatment phase, which led to a change in treatment.  The patient's primary complaints include constipation, bloating, and gastro pains, which he describes as feeling like he's swallowed a rock. This alternates with diarrhea.  These symptoms have been managed with over-the-counter remedies such as Tums. However, the patient also reports episodes of diarrhea, which he describes as high in frequency but low in volume. Reports occasional stomach pain, which he describes as a severe thunderstorm. This pain can be so severe that it leaves him doubled over and unable to do anything. He has not found a reliable way to manage this pain. Pain is cramping in nature. We discussed ways to manage constipation with daily MiraLAX.  If no relief increase MiraLAX to twice daily and include 2 tablets of senna at bedtime.  In the setting of diarrhea over-the-counter Imodium is effective however if continues to have loose stools instructed patient to contact office for further symptom management.  Bates endorses back pain, which he initially attributed to kidney stones. The pain subsided after the removal of the kidney stones but has since returned. The patient is unsure if the pain is due to new kidney stones or the progression of his cancer. He manages his pain with tramadol, which he takes three times a day. He also has a prescription for oxycodone, but he reports that it makes him feel weird and does not seem to provide better pain relief than tramadol.  We will continue with tramadol 50-100 mg every 6 hours as needed for pain.  Patient knows to contact office if pain becomes uncontrolled.  The patient has been taking a supplement called Lorie Phenix, which he believes has helped him pass kidney stones and reduced his back pain.   Complete physical, medication, medical, and psychosocial review completed.  Extensive discussions and explanation of palliative's role in collaboration with his oncology team to assist in his pain and symptom management.   We will continue with tramadol 1-2 tablets every 6 hours as needed.  He knows to contact office if pain increases or becomes uncontrolled.  Baclofen for muscle spasms. Levsin for abdominal cramping  and bloating.  We discussed his current illness and what it means in the larger context of his on-going co-morbidities. Natural disease trajectory and expectations were discussed.  Mr. Hussein is realistic in his understanding of current cancer and plan of care.  He is clear in his expressed wishes to continue to treat the treatable allow him every opportunity to  continue to thrive while aggressively managing symptoms and improving his current quality of life.  He is able to acknowledge that his cancer is terminal.  I discussed the importance of continued conversation with family and their medical providers regarding overall plan of care and treatment options, ensuring decisions are within the context of the patients values and GOCs.  Assessment & Plan  Pancreatic Cancer Established therapeutic relationship. Education provided on palliative's role in collaboration with their Oncology/Radiation team.  Patient undergoing chemotherapy with associated side effects including constipation, bloating, vomiting, diarrhea, and fatigue. -Continue current chemotherapy regimen as managed by oncologist. -Consider use of Levsin for gastro pains, bloating, cramping. -Consider use of baclofen for hiccups, spasms, stomach cramping and bloating as needed. -Continue use of tramadol for pain management, monitor for effectiveness and consider adjustment if needed.  Constipation -recommend Miralax once daily, increase to twice daily if ineffective. If still ineffective, add Senna two tablets at bedtime. -For diarrhea, recommend Imodium after first loose stool and after each subsequent loose stool up to four times daily. If more than four doses needed in one day, contact office for prescription strength medication.  Back Pain Unclear etiology, possibly related to kidney stones or pancreatic cancer. Currently managed with tramadol and occasionally oxycodone. -Continue tramadol for pain management, monitor for  effectiveness and consider adjustment if needed. -Consider use of hydrocodone if tramadol becomes less effective. -Consider dual medication (long-acting) approach if pain persists after adjustments of pain medication.  Kidney Stones History of kidney stones, possibly contributing to back pain. Patient has found relief with an herbal supplement. -Continue current management with herbal supplement. -Consider further evaluation if pain persists or worsens.  Follow-up  I will plan to see patient back in 2 weeks in collaboration to other oncology appointments.    Patient expressed understanding and was in agreement with this plan. He also understands that He can call the clinic at any time with any questions, concerns, or complaints.   Thank you for your referral and allowing Palliative to assist in Mr. Kiriakos Finau's care.   Number and complexity of problems addressed: HIGH - 1 or more chronic illnesses with SEVERE exacerbation, progression, or side effects of treatment - advanced cancer, pain. Any controlled substances utilized were prescribed in the context of palliative care.   Visit consisted of counseling and education dealing with the complex and emotionally intense issues of symptom management and palliative care in the setting of serious and potentially life-threatening illness.  Signed by: Willette Alma, AGPCNP-BC Palliative Medicine Team/[facility]   *Please note that this is a verbal dictation therefore any spelling or grammatical errors are due to the "[employer]agon [employer] One" system interpretation.

## 2023-09-05 ENCOUNTER — Inpatient Hospital Stay: Payer: Medicare Other

## 2023-09-05 ENCOUNTER — Inpatient Hospital Stay (HOSPITAL_BASED_OUTPATIENT_CLINIC_OR_DEPARTMENT_OTHER): Payer: Medicare Other | Admitting: Nurse Practitioner

## 2023-09-05 ENCOUNTER — Encounter: Payer: Self-pay | Admitting: Nurse Practitioner

## 2023-09-05 VITALS — BP 125/75 | HR 86 | Temp 97.9°F | Resp 16 | Wt 197.5 lb

## 2023-09-05 DIAGNOSIS — G893 Neoplasm related pain (acute) (chronic): Secondary | ICD-10-CM

## 2023-09-05 DIAGNOSIS — Z515 Encounter for palliative care: Secondary | ICD-10-CM

## 2023-09-05 DIAGNOSIS — C259 Malignant neoplasm of pancreas, unspecified: Secondary | ICD-10-CM | POA: Diagnosis not present

## 2023-09-05 DIAGNOSIS — M62838 Other muscle spasm: Secondary | ICD-10-CM | POA: Diagnosis not present

## 2023-09-05 DIAGNOSIS — R530 Neoplastic (malignant) related fatigue: Secondary | ICD-10-CM

## 2023-09-05 DIAGNOSIS — Z5111 Encounter for antineoplastic chemotherapy: Secondary | ICD-10-CM | POA: Diagnosis not present

## 2023-09-05 MED ORDER — SODIUM CHLORIDE 0.9% FLUSH
10.0000 mL | INTRAVENOUS | Status: DC | PRN
  Administered 2023-09-05: 10 mL

## 2023-09-05 MED ORDER — BACLOFEN 10 MG PO TABS
10.0000 mg | ORAL_TABLET | Freq: Three times a day (TID) | ORAL | 0 refills | Status: DC | PRN
Start: 2023-09-05 — End: 2023-11-26

## 2023-09-05 MED ORDER — HEPARIN SOD (PORK) LOCK FLUSH 100 UNIT/ML IV SOLN
500.0000 [IU] | Freq: Once | INTRAVENOUS | Status: AC | PRN
  Administered 2023-09-05: 500 [IU]

## 2023-09-05 MED ORDER — HYOSCYAMINE SULFATE 0.125 MG SL SUBL: SUBLINGUAL_TABLET | SUBLINGUAL | 0 refills | Status: DC

## 2023-09-12 ENCOUNTER — Telehealth: Payer: Self-pay | Admitting: Hematology

## 2023-09-12 NOTE — Telephone Encounter (Signed)
Left patient a message in regards to rescheduled appointment times for 10/17/2023 appointment

## 2023-09-15 ENCOUNTER — Other Ambulatory Visit: Payer: Self-pay | Admitting: Hematology

## 2023-09-15 DIAGNOSIS — C259 Malignant neoplasm of pancreas, unspecified: Secondary | ICD-10-CM

## 2023-09-15 NOTE — Progress Notes (Unsigned)
Palliative Medicine [facility]  [phone]) [phone] [phone]) [phone]   Name: Zachary Reid Date: 09/15/2023 MRN: [id number]  DOB: 1956/09/22  Patient Care Team: Lucky Cowboy, MD as PCP - General (Internal Medicine) Pollyann Samples, NP as PCP - Hematology/Oncology (Nurse Practitioner) Thomasene Ripple, DO as PCP - Cardiology (Cardiology) Nadara Mustard, MD as Consulting Physician (Orthopedic Surgery) Laurey Morale, MD as Consulting Physician (Cardiology) Malachy Mood, MD as Consulting Physician (Oncology) Pickenpack-Cousar, Arty Baumgartner, NP as Nurse Practitioner (Hospice and Palliative Medicine) Pickenpack-Cousar, Arty Baumgartner, NP as Nurse Practitioner [facility] and Palliative Medicine)    INTERVAL HISTORY: Zachary Reid is a 67 y.o. male with oncologic medical history including pancreatic adenocarcinoma (06/2021), HLD, diabetes, CKD, HTN, and anxiety. Palliative ask to see for symptom management and goals of care.     SOCIAL HISTORY:     reports that he has never smoked. He has never used smokeless tobacco. He reports that he does not drink alcohol and does not use drugs.  ADVANCE DIRECTIVES:  Advanced directives on file naming wife, Zachary Reid, as patient healthcare power of attorney, and daughter, Zachary Reid as Counsellor.   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 tobb;   newly dx ED 10-31-2022 w/ RVR  in setting flank pain (kidney stone)  --  office note in epic 11-07-2022 normal ETT 11-13-2022, echo 11-07-2022 ef 65-70% w/ mild LVH, mild MR,  zio monitor not completed,  started on toprol and xarelto daily   Personal history of chemotherapy    Receiving chemotherapy for metastatic pancreatic cancer. Most recent infusion (as of 12/02/22) was on 11/15/22.   Port-A-Cath in place 08/10/2021   Type 2 diabetes mellitus treated with insulin [facility])    endocrinologist--- dr Lucianne Muss   Vitamin D deficiency    takes Vit D supplements   Wears contact lenses     ALLERGIES:  is allergic to lipitor [atorvastatin].  MEDICATIONS:  Current Outpatient Medications  Medication Sig Dispense Refill   ALPRAZolam (XANAX) 0.5 MG tablet Take 1 tablet (0.5 mg total) by mouth 3 (three) times daily as needed for anxiety. 60 tablet 0   baclofen (LIORESAL) 10 MG tablet Take 1 tablet (10 mg total) by mouth 3 (three) times daily as needed for muscle spasms. 30 each 0   Cholecalciferol (VITAMIN D3) 125 MCG (5000 UT) CAPS Take 5,000 Units by mouth in the morning and at bedtime.     Continuous Blood Gluc Sensor (DEXCOM G7 SENSOR) MISC 1 Device by Does not apply route as directed. Change sensor every 10 days (Patient taking differently: 1 Device by Does not apply route as directed. Change sensor every 10 days Per patient on 12/02/22, he has not yet received glucose monitor.) 3 each 3   gabapentin (NEURONTIN) 600 MG tablet Take 1 tablet (600 mg total) by mouth 3 (  three) times daily as needed (pain). 90 tablet 0   glucose blood test strip Use as instructed 100 each 12   hyoscyamine (LEVSIN SL) 0.125 MG SL tablet DISSOLVE ONE TABLET UNDER THE TONGUE 4 TIMES DAILY UP TO EVERY 4 HOURS AS NEEDED FOR NAUSEA, BLOATING, CRAMPING, OR DIARRHEA 100 tablet 0   insulin degludec (TRESIBA FLEXTOUCH) 100 UNIT/ML FlexTouch Pen INJECT 32 UNITS INTO THE SKIN DAILY. 15 mL 1   insulin lispro (HUMALOG KWIKPEN) 100 UNIT/ML  KwikPen 10 TO 15 UNITS BEFORE MEALS AS DIRECTED 15 mL 0   metoprolol succinate (TOPROL XL) 25 MG 24 hr tablet Take 0.5 tablets (12.5 mg total) by mouth daily. 45 tablet 3   mirtazapine (REMERON) 7.5 MG tablet Take 1 tablet (7.5 mg total) by mouth at bedtime. 90 tablet 1   ondansetron (ZOFRAN) 8 MG tablet Take 1 tablet (8 mg total) by mouth every 8 (eight) hours as needed for nausea or vomiting. 40 tablet 1   oxyCODONE (ROXICODONE) 5 MG immediate release tablet Take 1 tablet (5 mg total) by mouth every 6 (six) hours as needed for severe pain. 30 tablet 0   prochlorperazine (COMPAZINE) 10 MG tablet Take 1 tablet (10 mg total) by mouth every 6 (six) hours as needed (Nausea or vomiting). 60 tablet 1   rivaroxaban (XARELTO) 20 MG TABS tablet Take 1 tablet (20 mg total) by mouth daily with supper. 90 tablet 3   silver sulfADIAZINE (SILVADENE) 1 % cream Apply 1 Application topically daily. 20 g 0   traMADol (ULTRAM) 50 MG tablet Take 1-2 tablets (50-100 mg total) by mouth every 6 (six) hours as needed. 120 tablet 0   zolpidem (AMBIEN) 10 MG tablet Take 1 tablet (10 mg total) by mouth at bedtime as needed. for sleep 30 tablet 1   No current facility-administered medications for this visit.    VITAL SIGNS: There were no vitals taken for this visit. There were no vitals filed for this visit.  Estimated body mass index is 27.55 kg/m as calculated from the following:   Height as of 09/03/23: 5\' 11"  (1.803 m).   Weight as of 09/05/23: 197 lb 8 oz (89.6 kg).   PERFORMANCE STATUS (ECOG) : 1 - Symptomatic but completely ambulatory   Physical Exam General: NAD Cardiovascular: regular rate and rhythm Pulmonary: normal breathing pattern  Extremities: no edema, no joint deformities Skin: no rashes Neurological: alert and oriented x3  Discussed the use of AI scribe software for clinical note transcription with the patient, who gave verbal consent to proceed.   IMPRESSION: I saw Mr. Mcquown during  his infusion. No acute distress. Shares significant gastrointestinal distress following his last treatment. He experienced persistent diarrhea from the beginning to the end of the treatment cycle. The patient also reported feeling ill after eating, with symptoms severe enough to induce vomiting. The frequency of these episodes was initially high, with the patient becoming sick seven to eight times a day, but gradually decreased over time. By the day before the consultation, the patient only experienced one episode of illness.  In addition to gastrointestinal issues, the patient also reported some level of pain, which was managed with Tramadol. He noted that his Tramadol effectively controlled the pain, and on some days, he only required one dose in the morning and one at night. Refill for his Tramadol has been sent in per Dr. Mosetta Putt. Despite these health issues, the patient reported sleeping well at night with the aid of Ambien.  The patient  was advised to take Imodium AD to manage the diarrhea and was given a new prescription by Dr. Mosetta Putt for Lomotil today to use as needed. The patient was also given Atropine with his treatment today.   Mr. Palmisano continue to take things one day at a time. Is trying to remain as active as possible. Knows to contact office as needed.  PLAN: Chemotherapy-induced Diarrhea Persistent diarrhea following recent chemotherapy treatment. Discussed with Dr. Blake Divine who recommended Imodium AD and a new prescription. -Start Imodium AD as recommended by Dr. Mosetta Putt. -Continue with the new prescription of Lomotil as advised by Dr. Mosetta Putt. -Received atropine with treatment today.   Cancer-related Pain Pain managed with Tramadol, with some days requiring only one dose in the morning and one at night. -Continue Tramadol as needed for pain. -Ensure refill of Tramadol as current supply is nearly depleted. Sent to pharmacy per Dr. Mosetta Putt   General Health Maintenance -Continue Ambien for  sleep. -Report any new issues or concerns promptly. -We will plan to follow-up in 3-4 weeks as needed.  Patient expressed understanding and was in agreement with this plan. He also understands that He can call the clinic at any time with any questions, concerns, or complaints.   Any controlled substances utilized were prescribed in the context of palliative care. PDMP has been reviewed.    Visit consisted of counseling and education dealing with the complex and emotionally intense issues of symptom management and palliative care in the setting of serious and potentially life-threatening illness.  Willette Alma, AGPCNP-BC  Palliative Medicine Team/[facility]  *Please note that this is a verbal dictation therefore any spelling or grammatical errors are due to the "[employer]agon [employer] One" system interpretation.

## 2023-09-16 MED FILL — Fosaprepitant Dimeglumine For IV Infusion 150 MG (Base Eq): INTRAVENOUS | Qty: 5 | Status: AC

## 2023-09-16 NOTE — Assessment & Plan Note (Signed)
[id number] with numerous small cavitary lesions in bilateral lungs, MMR normal  -diagnosed 07/2021, after incidental finding on CT scan for kidney stone, by colonoscopy/EUS. -Baseline CA 19-9 elevated to 273 on 07/03/21 -He began first-line mFOLFIRINOX on 08/13/21, but required dose reduction and move to every 3 weeks. -his CA 19-9 trended up but is now stable. -restaging CT CAP 08/12/22 showed: multiple new and enlarging pulmonary metastases; pancreatic mass grossly stable. Will repeat in 3 months. -we changed his irinotecan to liposomal on 09/05/22. He tolerated better and feels stronger. -Due to disease progression, his treatment was changed to gemcitabine and Abraxane every 2 weeks on 01/02/2023 -He tolerated the first two cycles chemotherapy very well with mild fatigue.  I increased gemcitabine to 1000mg /m2 from cycle 3 -Restaging CT scan 03/12/2023 showed stable disease overall. His primary pancreatic tumor is slightly increased in size, however his multiple cavitary lung nodules are slightly improved with thinner walls now.   -will continue current therapy.  He is overall tolerating well, but he has developed worsening fatigue lately.  I will reduce gemcitabine dose to 800 mg/m again. -His tumor marker CA 19.9 continues trending down, which is a good clinical sign. -restaging CT 07/22/2023 showed moderate disease progression at the primary site and lung mets, I personally reviewed the scan images with patient and his wife in detail. -I changed his chemo to liposomal irinotecan and 5-FU pump infusion every 2 weeks on 08/07/2023 with dose reduction due to fatigue  -He understands the goal of chemotherapy is palliative, to prolong his life

## 2023-09-17 ENCOUNTER — Inpatient Hospital Stay: Payer: Medicare Other | Admitting: Hematology

## 2023-09-17 ENCOUNTER — Inpatient Hospital Stay (HOSPITAL_BASED_OUTPATIENT_CLINIC_OR_DEPARTMENT_OTHER): Payer: Medicare Other | Admitting: Nurse Practitioner

## 2023-09-17 ENCOUNTER — Inpatient Hospital Stay: Payer: Medicare Other

## 2023-09-17 ENCOUNTER — Encounter: Payer: Self-pay | Admitting: Nurse Practitioner

## 2023-09-17 VITALS — BP 107/71 | HR 74 | Temp 97.5°F | Resp 18 | Ht 71.0 in | Wt 195.1 lb

## 2023-09-17 DIAGNOSIS — Z515 Encounter for palliative care: Secondary | ICD-10-CM

## 2023-09-17 DIAGNOSIS — Z5111 Encounter for antineoplastic chemotherapy: Secondary | ICD-10-CM | POA: Diagnosis not present

## 2023-09-17 DIAGNOSIS — G893 Neoplasm related pain (acute) (chronic): Secondary | ICD-10-CM

## 2023-09-17 DIAGNOSIS — R197 Diarrhea, unspecified: Secondary | ICD-10-CM

## 2023-09-17 DIAGNOSIS — C259 Malignant neoplasm of pancreas, unspecified: Secondary | ICD-10-CM

## 2023-09-17 DIAGNOSIS — E860 Dehydration: Secondary | ICD-10-CM

## 2023-09-17 DIAGNOSIS — Z95828 Presence of other vascular implants and grafts: Secondary | ICD-10-CM

## 2023-09-17 LAB — CBC WITH DIFFERENTIAL (CANCER CENTER ONLY)
Abs Immature Granulocytes: 0.01 10*3/uL (ref 0.00–0.07)
Basophils Absolute: 0 10*3/uL (ref 0.0–0.1)
Basophils Relative: 1 %
Eosinophils Absolute: 0.2 10*3/uL (ref 0.0–0.5)
Eosinophils Relative: 5 %
HCT: 32.5 % — ABNORMAL LOW (ref 39.0–52.0)
Hemoglobin: 11.6 g/dL — ABNORMAL LOW (ref 13.0–17.0)
Immature Granulocytes: 0 %
Lymphocytes Relative: 27 %
Lymphs Abs: 1.1 10*3/uL (ref 0.7–4.0)
MCH: 34.2 pg — ABNORMAL HIGH (ref 26.0–34.0)
MCHC: 35.7 g/dL (ref 30.0–36.0)
MCV: 95.9 fL (ref 80.0–100.0)
Monocytes Absolute: 0.4 10*3/uL (ref 0.1–1.0)
Monocytes Relative: 10 %
Neutro Abs: 2.4 10*3/uL (ref 1.7–7.7)
Neutrophils Relative %: 57 %
Platelet Count: 86 10*3/uL — ABNORMAL LOW (ref 150–400)
RBC: 3.39 MIL/uL — ABNORMAL LOW (ref 4.22–5.81)
RDW: 14.6 % (ref 11.5–15.5)
WBC Count: 4.1 10*3/uL (ref 4.0–10.5)
nRBC: 0 % (ref 0.0–0.2)

## 2023-09-17 LAB — CMP (CANCER CENTER ONLY)
ALT: 21 U/L (ref 0–44)
AST: 17 U/L (ref 15–41)
Albumin: 3.6 g/dL (ref 3.5–5.0)
Alkaline Phosphatase: 92 U/L (ref 38–126)
Anion gap: 6 (ref 5–15)
BUN: 13 mg/dL (ref 8–23)
CO2: 25 mmol/L (ref 22–32)
Calcium: 8.6 mg/dL — ABNORMAL LOW (ref 8.9–10.3)
Chloride: 111 mmol/L (ref 98–111)
Creatinine: 0.94 mg/dL (ref 0.61–1.24)
GFR, Estimated: >60 mL/min (ref >60)
Glucose, Bld: 201 mg/dL — ABNORMAL HIGH (ref 70–99)
Potassium: 3.4 mmol/L — ABNORMAL LOW (ref 3.5–5.1)
Sodium: 142 mmol/L (ref 135–145)
Total Bilirubin: 0.4 mg/dL (ref 0.3–1.2)
Total Protein: 6.2 g/dL — ABNORMAL LOW (ref 6.5–8.1)

## 2023-09-17 MED ORDER — IRINOTECAN HCL LIPOSOME CHEMO INJECTION 43 MG/10ML
40.0000 mg/m2 | INJECTION | Freq: Once | INTRAVENOUS | Status: AC
  Administered 2023-09-17: 86 mg via INTRAVENOUS
  Filled 2023-09-17: qty 20

## 2023-09-17 MED ORDER — SODIUM CHLORIDE 0.9 % IV SOLN: Freq: Once | INTRAVENOUS | Status: AC

## 2023-09-17 MED ORDER — ATROPINE SULFATE 1 MG/ML IV SOLN
0.5000 mg | Freq: Once | INTRAVENOUS | Status: AC | PRN
  Administered 2023-09-17: 0.5 mg via INTRAVENOUS
  Filled 2023-09-17: qty 1

## 2023-09-17 MED ORDER — DEXAMETHASONE SODIUM PHOSPHATE 10 MG/ML IJ SOLN
10.0000 mg | Freq: Once | INTRAMUSCULAR | Status: AC
  Administered 2023-09-17: 10 mg via INTRAVENOUS
  Filled 2023-09-17: qty 1

## 2023-09-17 MED ORDER — SODIUM CHLORIDE 0.9 % IV SOLN
3100.0000 mg | INTRAVENOUS | Status: DC
  Administered 2023-09-17: 3100 mg via INTRAVENOUS
  Filled 2023-09-17: qty 62

## 2023-09-17 MED ORDER — SODIUM CHLORIDE 0.9 % IV SOLN
400.0000 mg/m2 | Freq: Once | INTRAVENOUS | Status: AC
  Administered 2023-09-17: 852 mg via INTRAVENOUS
  Filled 2023-09-17: qty 42.6

## 2023-09-17 MED ORDER — DIPHENOXYLATE-ATROPINE 2.5-0.025 MG PO TABS: 1.0000 | ORAL_TABLET | Freq: Four times a day (QID) | ORAL | 0 refills | Status: DC | PRN

## 2023-09-17 MED ORDER — PALONOSETRON HCL INJECTION 0.25 MG/5ML
0.2500 mg | Freq: Once | INTRAVENOUS | Status: AC
  Administered 2023-09-17: 0.25 mg via INTRAVENOUS
  Filled 2023-09-17: qty 5

## 2023-09-17 MED ORDER — SODIUM CHLORIDE 0.9% FLUSH
10.0000 mL | Freq: Once | INTRAVENOUS | Status: AC
  Administered 2023-09-17: 10 mL

## 2023-09-17 MED ORDER — FOSAPREPITANT DIMEGLUMINE INJECTION 150 MG
150.0000 mg | Freq: Once | INTRAVENOUS | Status: AC
  Administered 2023-09-17: 150 mg via INTRAVENOUS
  Filled 2023-09-17: qty 150

## 2023-09-17 MED ORDER — TRAMADOL HCL 50 MG PO TABS: 50.0000 mg | ORAL_TABLET | Freq: Four times a day (QID) | ORAL | 0 refills | Status: DC | PRN

## 2023-09-17 NOTE — Progress Notes (Signed)
[facility]   Telephone:(336) [phone] Fax:(336) [phone]   Clinic Follow up Note   Patient Care Team: Lucky Cowboy, MD as PCP - General (Internal Medicine) Pollyann Samples, NP as PCP - Hematology/Oncology (Nurse Practitioner) Thomasene Ripple, DO as PCP - Cardiology (Cardiology) Nadara Mustard, MD as Consulting Physician (Orthopedic Surgery) Laurey Morale, MD as Consulting Physician (Cardiology) Malachy Mood, MD as Consulting Physician (Oncology) Pickenpack-Cousar, Arty Baumgartner, NP as Nurse Practitioner (Hospice and Palliative Medicine) Pickenpack-Cousar, Arty Baumgartner, NP as Nurse Practitioner [facility] and Palliative Medicine)  Date of Service:  09/17/2023  CHIEF COMPLAINT: f/u of metastatic pancreatic cancer  CURRENT THERAPY:  Liposomal irinotecan and 5-FU pump infusion every 2 weeks  Oncology History   Pancreatic adenocarcinoma (HCC) [id number] with numerous small cavitary lesions in bilateral lungs, MMR normal  -diagnosed 07/2021, after incidental finding on CT scan for kidney stone, by colonoscopy/EUS. -Baseline CA 19-9 elevated to 273 on 07/03/21 -He began first-line mFOLFIRINOX on 08/13/21, but required dose reduction and move to every 3 weeks. -his CA 19-9 trended up but is now stable. -restaging CT CAP 08/12/22 showed: multiple new and enlarging pulmonary metastases; pancreatic mass grossly stable. Will repeat in 3 months. -we changed his irinotecan to liposomal on 09/05/22. He tolerated better and feels stronger. -Due to disease progression, his treatment was changed to gemcitabine and Abraxane every 2 weeks on 01/02/2023 -He tolerated the first two cycles chemotherapy very well with mild fatigue.  I increased gemcitabine to 1000mg /m2 from cycle 3 -Restaging CT scan 03/12/2023 showed stable disease overall. His primary pancreatic tumor is slightly increased in size, however his multiple cavitary lung nodules are slightly improved with thinner walls now.   -will continue  current therapy.  He is overall tolerating well, but he has developed worsening fatigue lately.  I will reduce gemcitabine dose to 800 mg/m again. -His tumor marker CA 19.9 continues trending down, which is a good clinical sign. -restaging CT 07/22/2023 showed moderate disease progression at the primary site and lung mets, I personally reviewed the scan images with patient and his wife in detail. -I changed his chemo to liposomal irinotecan and 5-FU pump infusion every 2 weeks on 08/07/2023 with dose reduction due to fatigue  -He understands the goal of chemotherapy is palliative, to prolong his life    Assessment and Plan    Pancreatic Cancer Undergoing chemotherapy with associated side effects. Tumor marker stable but not decreasing. -Continue current chemotherapy regimen with dose reduction to manage side effects. -Next scan scheduled for December if tumor marker remains stable.  Chemotherapy-Induced Diarrhea Significant diarrhea post-chemotherapy leading to weight loss and decreased quality of life. Patient has been using Pepto-Bismol with limited relief. -Start Imodium after each loose bowel movement (2 tablets after first loose stool, then 1 tablet after each subsequent loose stool, up to 8 tablets per day). -If Imodium is insufficient, consider using prescribed Lomotil. -Ensure adequate hydration and electrolyte replacement (consider Gatorade, Pedialyte, or over-the-counter Liquid IV).  Pain Management Current regimen includes Tramadol and Oxycodone, with Tramadol as the primary medication and Oxycodone for severe episodes. -Refill Tramadol prescription.  Follow-up in 2 weeks to assess response to chemotherapy dose reduction and management of diarrhea.     Thrombocytopenia -Secondary to chemotherapy, will monitor closely  Plan -Lab reviewed, adequate for treatment, will proceed chemotherapy today with dose reduction on liposomal irinotecan today due to diarrhea -He will start  using Imodium for diarrhea, and I called in Lomotil for him -If diarrhea is manageable,  will increase irinotecan dose back next cycle -Follow-up in 2 weeks   SUMMARY OF ONCOLOGIC HISTORY: Oncology History Overview Note   Cancer Staging  Pancreatic adenocarcinoma [facility]) Staging form: Exocrine Pancreas, AJCC 8th Edition - Clinical stage from 07/26/2021: Stage IV (cT4, cN0, cM1) - Signed by Malachy Mood, MD on 07/28/2021    Pancreatic adenocarcinoma (HCC)  07/02/2021 Initial Diagnosis   Pancreatic adenocarcinoma (HCC)   07/26/2021 Cancer Staging   Staging form: Exocrine Pancreas, AJCC 8th Edition - Clinical stage from 07/26/2021: Stage IV (cT4, cN0, cM1) - Signed by Malachy Mood, MD on 07/28/2021 Stage prefix: Initial diagnosis Total positive nodes: 0   08/13/2021 - 07/06/2022 Chemotherapy   Patient is on Treatment Plan : PANCREAS Modified FOLFIRINOX q14d x 4 cycles      Genetic Testing   [facility] CancerNext-Expanded results (77 genes) were negative. No pathogenic variants were identified. 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 The report date is 08/15/2021.    The CancerNext-Expanded gene panel offered by [facility] and includes sequencing, rearrangement, and RNA analysis for the following 77 genes: AIP, ALK, APC, ATM, AXIN2, BAP1, BARD1, BLM, BMPR1A, BRCA1, BRCA2, BRIP1, CDC73, CDH1, CDK4, CDKN1B, CDKN2A, CHEK2, CTNNA1, DICER1, FANCC, FH, FLCN, GALNT12, KIF1B, LZTR1, MAX, MEN1, MET, MLH1, MSH2, MSH3, MSH6, MUTYH, NBN, NF1, NF2, NTHL1, PALB2, PHOX2B, PMS2, POT1, PRKAR1A, PTCH1, PTEN, RAD51C, RAD51D, RB1, RECQL, RET, SDHA, SDHAF2, SDHB, SDHC, SDHD, SMAD4, SMARCA4, SMARCB1, SMARCE1, STK11, SUFU, TMEM127, TP53, TSC1, TSC2, VHL and XRCC2 (sequencing and deletion/duplication); EGFR, EGLN1, HOXB13, KIT, MITF, PDGFRA, POLD1, and POLE (sequencing only); EPCAM and GREM1 (deletion/duplication only).     08/13/2021 - 12/06/2022 Chemotherapy   Patient is on Treatment Plan :  PANCREAS Modified FOLFIRINOX q14d x 4 cycles     10/25/2021 Imaging   EXAM: CT CHEST, ABDOMEN, AND PELVIS WITH CONTRAST  IMPRESSION: 1. Innumerable bilateral pulmonary nodules, many of which are cavitary, consistent with metastatic disease. These nodules show no substantial change in are minimally progressed in the interval. 2. Mix cystic and solid lesion in the head and body of the pancreas is similar to prior and also comparing back to MRI 07/02/2021. 3. Hepatic cysts. 4. 8 mm nonobstructing left renal stone. 5. Aortic Atherosclerosis (ICD10-I70.0).   01/28/2022 Imaging   EXAM: CT CHEST, ABDOMEN, AND PELVIS WITH CONTRAST  IMPRESSION: 1. Innumerable bilateral small solid and cavitary pulmonary nodules, some of the cavitary nodules are slightly less thick-walled when compared with prior exam. 2. Mixed cystic and solid lesion in the head and body of the pancreas is similar to prior exam. 3. Nonobstructing left renal stone. 4.  Aortic Atherosclerosis (ICD10-I70.0).   11/29/2022 Imaging    IMPRESSION: 1. No significant change in primary pancreatic mass and adjacent cystic component. Unchanged appearance of soft tissue extending to involve the adjacent celiac axis, superior mesenteric artery origin, and portal confluence. 2. 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 3. Innumerable small pulmonary nodules throughout the lungs, the majority of which are cavitary. Although interval change is difficult to appreciate for the majority of these nodules due to small size, at least some of these are slightly enlarged, consistent with worsened pulmonary metastatic disease. 4. Newly enlarged left retroperitoneal lymph nodes, which may reflect nodal metastatic disease or perhaps reactive to left hydronephrosis. Attention on follow-up. 5. A sizable calculus previously seen in the left renal pelvis has migrated to the  middle third of the left ureter, with  placement of a double-J left ureteral stent, with formed pigtails in the left renal pelvis and urinary bladder. Moderate left hydronephrosis and proximal hydroureter. 6. Coronary artery disease.   Aortic Atherosclerosis (ICD10-I70.0).   01/02/2023 - 06/26/2023 Chemotherapy   Patient is on Treatment Plan : PANCREATIC Abraxane D1,8,15 + Gemcitabine D1,8,15 q28d     03/12/2023 Imaging    IMPRESSION: 1. Primary pancreatic mass with adjacent cystic component is slightly increased in size when compared with the prior exam. Similar soft tissue extending involve the celiac axis superior mesenteric artery origin and portal confluence. 2. Innumerable bilateral pulmonary nodules, some of which are cavitary, unchanged when compared with the prior. 3. Left retroperitoneal lymph nodes are decreased in size, likely resolving reactive adenopathy 4. Interval removal of left ureter stent.  No hydronephrosis. 5. coronary artery disease and aortic Atherosclerosis (ICD10-I70.0).   07/22/2023 Imaging   CT CAP W CONTRAST   IMPRESSION: 1. Today's study demonstrates progression of disease as evidenced by enlargement of the primary pancreatic mass, mild enlargement of numerous prominent borderline enlarged and mildly enlarged upper abdominal and retroperitoneal lymph nodes, and increased number and size of numerous metastatic nodules scattered throughout the lungs bilaterally, as detailed above. 2. 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 Main portal vein remains patent at this time. 3. 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 4. Nonobstructive nephrolithiasis in the kidneys bilaterally measuring 2-4 mm in size. 5. Aortic atherosclerosis, in addition to left main and three-vessel coronary artery disease. 6. Additional incidental  findings, as above.   08/07/2023 -  Chemotherapy   Patient is on Treatment Plan : PANCREAS Liposomal Irinotecan + Leucovorin + 5-FU IVCI q14d        Discussed the use of AI scribe software for clinical note transcription with the patient, who gave verbal consent to proceed.  History of Present Illness   The patient, a 67 year old [place] with a history of pancreatic cancer, presents for a follow-up visit. He reports experiencing severe diarrhea since his last chemotherapy treatment, which has led to a weight loss of about three pounds. The patient states that he has an appetite, but the diarrhea discourages him from eating. Initially, he was experiencing diarrhea five to six times a day, but this has decreased to one to two times a day. However, he notes that eating triggers the diarrhea. The patient has been managing the diarrhea with Pepto-Bismol, but acknowledges that it is not sufficient. He also reports abdominal pain and bloating. The patient's pain is managed with tramadol and oxycodone, with the latter reserved for severe pain episodes. He takes up to four tramadol tablets a day.         All other systems were reviewed with the patient and are negative.  MEDICAL HISTORY:  Past Medical History:  Diagnosis Date   Adenocarcinoma of pancreas, stage 4 (HCC) 07/2021   oncologist--- dr Mosetta Putt;   mets to  bilateral lung;  started chemo 08-13-2021   Anemia    GAD (generalized anxiety disorder)    History of adenomatous polyp of colon    Hyperlipidemia    Hypogonadism [place]    IBS (irritable bowel syndrome)    Insomnia    Left ureteral calculus    Metastatic cancer to lung (HCC) 07/2021   primary pancreatic cancer w/ numerous small cavity lesions bilateral lungs   PAF (paroxysmal atrial fibrillation) (HCC) 10/31/2022   cardiologist-- dr Kirtland Bouchard. tobb;  newly dx ED 10-31-2022 w/ RVR  in setting flank pain (kidney stone)  --  office note in epic 11-07-2022 normal ETT 11-13-2022, echo 11-07-2022 ef  65-70% w/ mild LVH, mild MR,  zio monitor not completed,  started on toprol and xarelto daily   Personal history of chemotherapy    Receiving chemotherapy for metastatic pancreatic cancer. Most recent infusion (as of 12/02/22) was on 12/Zachary/23.   Port-A-Cath in place 08/10/2021   Type 2 diabetes mellitus treated with insulin [facility])    endocrinologist--- dr Lucianne Muss   Vitamin D deficiency    takes Vit D supplements   Wears contact lenses     SURGICAL HISTORY: Past Surgical History:  Procedure Laterality Date   APPENDECTOMY  1988   BIOPSY  07/26/2021   Procedure: BIOPSY;  Surgeon: Lemar Lofty., MD;  Location: [facility] ENDOSCOPY;  Service: Gastroenterology;;   COLONOSCOPY WITH PROPOFOL N/A 07/26/2021   Procedure: COLONOSCOPY WITH PROPOFOL;  Surgeon: Lemar Lofty., MD;  Location: [facility] ENDOSCOPY;  Service: Gastroenterology;  Laterality: N/A;   CYSTOSCOPY WITH STENT PLACEMENT Left 10/31/2022   Procedure: CYSTOSCOPY WITH STENT PLACEMENT;  Surgeon: Belva Agee, MD;  Location: [facility] ORS;  Service: Urology;  Laterality: Left;   CYSTOSCOPY/URETEROSCOPY/HOLMIUM LASER/STENT PLACEMENT Left 12/03/2022   Procedure: CYSTOSCOPY/LEFT URETEROSCOPY/HOLMIUM LASER/STENT EXCHANGE;  Surgeon: Belva Agee, MD;  [facility];  Service: Urology;  Laterality: Left;  1 HR FOR CASE   ESOPHAGOGASTRODUODENOSCOPY (EGD) WITH PROPOFOL N/A 07/26/2021   Procedure: ESOPHAGOGASTRODUODENOSCOPY (EGD) WITH PROPOFOL;  Surgeon: Meridee Score Netty Starring., MD;  Location: [facility] ENDOSCOPY;  Service: Gastroenterology;  Laterality: N/A;   EUS N/A 07/26/2021   Procedure: UPPER ENDOSCOPIC ULTRASOUND (EUS) RADIAL;  Surgeon: Lemar Lofty., MD;  Location: [facility] ENDOSCOPY;  Service: Gastroenterology;  Laterality: N/A;   FINE NEEDLE ASPIRATION  07/26/2021   Procedure: FINE NEEDLE ASPIRATION (FNA) LINEAR;  Surgeon: Meridee Score Netty Starring., MD;  Location: [facility] ENDOSCOPY;  Service: Gastroenterology;;   IR  IMAGING GUIDED PORT INSERTION  08/10/2021   POLYPECTOMY  07/26/2021   Procedure: POLYPECTOMY;  Surgeon: Meridee Score Netty Starring., MD;  Location[facility] ENDOSCOPY;  Service: Gastroenterology;;   SHOULDER SURGERY Right    as a teenager    I have reviewed the social history and family history with the patient and they are unchanged from previous note.  ALLERGIES:  is allergic to lipitor [atorvastatin].  MEDICATIONS:  Current Outpatient Medications  Medication Sig Dispense Refill   diphenoxylate-atropine (LOMOTIL) 2.5-0.025 MG tablet Take 1-2 tablets by mouth 4 (four) times daily as needed for diarrhea or loose stools. 30 tablet 0   ALPRAZolam (XANAX) 0.5 MG tablet Take 1 tablet (0.5 mg total) by mouth 3 (three) times daily as needed for anxiety. 60 tablet 0   baclofen (LIORESAL) 10 MG tablet Take 1 tablet (10 mg total) by mouth 3 (three) times daily as needed for muscle spasms. 30 each 0   Cholecalciferol (VITAMIN D3) 125 MCG (5000 UT) CAPS Take 5,000 Units by mouth in the morning and at bedtime.     Continuous Blood Gluc Sensor (DEXCOM G7 SENSOR) MISC 1 Device by Does not apply route as directed. Change sensor every 10 days (Patient taking differently: 1 Device by Does not apply route as directed. Change sensor every 10 days Per patient on 12/02/22, he has not yet received glucose monitor.) 3 each 3   gabapentin (NEURONTIN) 600 MG tablet Take 1 tablet (600 mg total) by mouth 3 (three) times daily as needed (pain). 90 tablet 0  glucose blood test strip Use as instructed 100 each 12   hyoscyamine (LEVSIN SL) 0.125 MG SL tablet DISSOLVE ONE TABLET UNDER THE TONGUE 4 TIMES DAILY UP TO EVERY 4 HOURS AS NEEDED FOR NAUSEA, BLOATING, CRAMPING, OR DIARRHEA 100 tablet 0   insulin degludec (TRESIBA FLEXTOUCH) 100 UNIT/ML FlexTouch Pen INJECT 32 UNITS INTO THE SKIN DAILY. 15 mL 1   insulin lispro (HUMALOG KWIKPEN) 100 UNIT/ML KwikPen 10 TO 15 UNITS BEFORE MEALS AS DIRECTED 15 mL 0   metoprolol succinate  (TOPROL XL) 25 MG 24 hr tablet Take 0.5 tablets (12.5 mg total) by mouth daily. 45 tablet 3   mirtazapine (REMERON) 7.5 MG tablet Take 1 tablet (7.5 mg total) by mouth at bedtime. 90 tablet 1   ondansetron (ZOFRAN) 8 MG tablet Take 1 tablet (8 mg total) by mouth every 8 (eight) hours as needed for nausea or vomiting. 40 tablet 1   oxyCODONE (ROXICODONE) 5 MG immediate release tablet Take 1 tablet (5 mg total) by mouth every 6 (six) hours as needed for severe pain. 30 tablet 0   prochlorperazine (COMPAZINE) 10 MG tablet Take 1 tablet (10 mg total) by mouth every 6 (six) hours as needed (Nausea or vomiting). 60 tablet 1   rivaroxaban (XARELTO) 20 MG TABS tablet Take 1 tablet (20 mg total) by mouth daily with supper. 90 tablet 3   silver sulfADIAZINE (SILVADENE) 1 % cream Apply 1 Application topically daily. 20 g 0   traMADol (ULTRAM) 50 MG tablet Take 1-2 tablets (50-100 mg total) by mouth every 6 (six) hours as needed. 120 tablet 0   zolpidem (AMBIEN) 10 MG tablet Take 1 tablet (10 mg total) by mouth at bedtime as needed. for sleep 30 tablet 1   No current facility-administered medications for this visit.   Facility-Administered Medications Ordered in Other Visits  Medication Dose Route Frequency Provider Last Rate Last Admin   atropine injection 0.5 mg  0.5 mg Intravenous Once PRN Malachy Mood, MD       fluorouracil (ADRUCIL) 3,100 mg in sodium chloride 0.9 % 88 mL chemo infusion  3,100 mg Intravenous 1 day or 1 dose Malachy Mood, MD       fosaprepitant (EMEND) 150 mg in sodium chloride 0.9 % 145 mL IVPB  150 mg Intravenous Once Malachy Mood, MD       irinotecan LIPOSOME (ONIVYDE) 86 mg in sodium chloride 0.9 % 500 mL chemo infusion  40 mg/m2 (Treatment Plan Recorded) Intravenous Once Malachy Mood, MD       leucovorin 852 mg in sodium chloride 0.9 % 250 mL infusion  400 mg/m2 (Treatment Plan Recorded) Intravenous Once Malachy Mood, MD        PHYSICAL EXAMINATION: ECOG PERFORMANCE STATUS: 2 - Symptomatic, <50%  confined to bed  Vitals:   09/17/23 0837  BP: 107/71  Pulse: 74  Resp: 18  Temp: (!) 97.5 F (36.4 C)  SpO2: 97%   Wt Readings from Last 3 Encounters:  09/17/23 195 lb 1.6 oz (88.5 kg)  09/05/23 197 lb 8 oz (89.6 kg)  09/03/23 196 lb 6.4 oz (89.1 kg)     GENERAL:alert, no distress and comfortable SKIN: skin color, texture, turgor are normal, no rashes or significant lesions EYES: normal, Conjunctiva are pink and non-injected, sclera clear NECK: supple, thyroid normal size, non-tender, without nodularity LYMPH:  no palpable lymphadenopathy in the cervical, axillary  LUNGS: clear to auscultation and percussion with normal breathing effort HEART: regular rate & rhythm and no murmurs and  no lower extremity edema ABDOMEN:abdomen soft, non-tender and normal bowel sounds Musculoskeletal:no cyanosis of digits and no clubbing  NEURO: alert & oriented x 3 with fluent speech, no focal motor/sensory deficits    LABORATORY DATA:  I have reviewed the data as listed    Latest Ref Rng & Units 09/17/2023    8:04 AM 09/03/2023    9:54 AM 08/21/2023    9:08 AM  CBC  WBC 4.0 - 10.5 K/uL 4.1  4.2  8.2   Hemoglobin 13.0 - 17.0 g/dL 60.4  54.0  98.1   Hematocrit 39.0 - 52.0 % 32.5  34.4  40.2   Platelets 150 - 400 K/uL 86  83  138         Latest Ref Rng & Units 09/17/2023    8:04 AM 09/03/2023    9:54 AM 08/21/2023    9:08 AM  CMP  Glucose 70 - 99 mg/dL 191  478  295   BUN 8 - 23 mg/dL 13  10  18    Creatinine 0.61 - 1.24 mg/dL 6.21  3.08  6.57   Sodium 135 - 145 mmol/L 142  138  141   Potassium 3.5 - 5.1 mmol/L 3.4  3.6  3.9   Chloride 98 - 111 mmol/L 111  106  108   CO2 22 - 32 mmol/L 25  23  28    Calcium 8.9 - 10.3 mg/dL 8.6  8.7  9.4   Total Protein 6.5 - 8.1 g/dL 6.2  6.5  7.2   Total Bilirubin 0.3 - 1.2 mg/dL 0.4  0.8  0.6   Alkaline Phos 38 - 126 U/L 92  81  101   AST 15 - 41 U/L 17  22  17    ALT 0 - 44 U/L 21  26  19        RADIOGRAPHIC STUDIES: I have personally  reviewed the radiological images as listed and agreed with the findings in the report. No results found.    Orders Placed This Encounter  Procedures   CBC with Differential (Cancer Center Only)    Standing Status:   Future    Standing Expiration Date:   11/19/2024   CMP (Cancer Center only)    Standing Status:   Future    Standing Expiration Date:   11/19/2024   All questions were answered. The patient knows to call the clinic with any problems, questions or concerns. No barriers to learning was detected. The total time spent in the appointment was 30 minutes.     Malachy Mood, MD 09/17/2023

## 2023-09-17 NOTE — Patient Instructions (Signed)
[place] [facility]  Discharge Instructions: Thank you for choosing [facility] to provide your oncology and hematology care.   If you have a lab appointment with the Cancer Center, please go directly to the Cancer Center and check in at the registration area.   Wear comfortable clothing and clothing appropriate for easy access to any Portacath or PICC line.   We strive to give you quality time with your provider. You may need to reschedule your appointment if you arrive late (15 or more minutes).  Arriving late affects you and other patients whose appointments are after yours.  Also, if you miss three or more appointments without notifying the office, you may be dismissed from the clinic at the provider's discretion.      For prescription refill requests, have your pharmacy contact our office and allow 72 hours for refills to be completed.    Today you received the following chemotherapy and/or immunotherapy agents: Irinotecan liposome, Leucovorin, 5FU      To help prevent nausea and vomiting after your treatment, we encourage you to take your nausea medication as directed.  BELOW ARE SYMPTOMS THAT SHOULD BE REPORTED IMMEDIATELY: *FEVER GREATER THAN 100.4 F (38 C) OR HIGHER *CHILLS OR SWEATING *NAUSEA AND VOMITING THAT IS NOT CONTROLLED WITH YOUR NAUSEA MEDICATION *UNUSUAL SHORTNESS OF BREATH *UNUSUAL BRUISING OR BLEEDING *URINARY PROBLEMS (pain or burning when urinating, or frequent urination) *BOWEL PROBLEMS (unusual diarrhea, constipation, pain near the anus) TENDERNESS IN MOUTH AND THROAT WITH OR WITHOUT PRESENCE OF ULCERS (sore throat, sores in mouth, or a toothache) UNUSUAL RASH, SWELLING OR PAIN  UNUSUAL VAGINAL DISCHARGE OR ITCHING   Items with * indicate a potential emergency and should be followed up as soon as possible or go to the Emergency Department if any problems should occur.  Please show the CHEMOTHERAPY ALERT CARD or  IMMUNOTHERAPY ALERT CARD at check-in to the Emergency Department and triage nurse.  Should you have questions after your visit or need to cancel or reschedule your appointment, please contact [place] [facility]  Dept: [phone]  and follow the prompts.  Office hours are 8:00 a.m. to 4:30 p.m. Monday - Friday. Please note that voicemails left after 4:00 p.m. may not be returned until the following business day.  We are closed weekends and major holidays. You have access to a nurse at all times for urgent questions. Please call the main number to the clinic Dept: [phone] and follow the prompts.   For any non-urgent questions, you may also contact your provider using MyChart. We now offer e-Visits for anyone 63 and older to request care online for non-urgent symptoms. For details visit [email or web address].   Also download the MyChart app! Go to the app store, search "MyChart", open the app, select [place], and log in with your MyChart username and password.

## 2023-09-18 ENCOUNTER — Other Ambulatory Visit: Payer: Self-pay

## 2023-09-18 ENCOUNTER — Telehealth: Payer: Self-pay | Admitting: Endocrinology

## 2023-09-18 DIAGNOSIS — E1165 Type 2 diabetes mellitus with hyperglycemia: Secondary | ICD-10-CM

## 2023-09-18 DIAGNOSIS — E1122 Type 2 diabetes mellitus with diabetic chronic kidney disease: Secondary | ICD-10-CM

## 2023-09-18 MED ORDER — DEXCOM G7 SENSOR MISC: 1.0000 | 3 refills | Status: DC

## 2023-09-18 MED ORDER — TRESIBA FLEXTOUCH 100 UNIT/ML ~~LOC~~ SOPN: 32.0000 [IU] | PEN_INJECTOR | Freq: Every day | SUBCUTANEOUS | 1 refills | Status: DC

## 2023-09-18 MED ORDER — INSULIN LISPRO (1 UNIT DIAL) 100 UNIT/ML (KWIKPEN): PEN_INJECTOR | SUBCUTANEOUS | 0 refills | Status: DC

## 2023-09-18 NOTE — Telephone Encounter (Signed)
Medication refills complete, patient made aware via MyChart.

## 2023-09-18 NOTE — Telephone Encounter (Signed)
MEDICATION: Tresiba refill and update Humalog and Dexcom to reflect Dr. Erroll Luna and not Dr. Lucianne Muss  PHARMACY:  [facility] on [address]  HAS THE PATIENT CONTACTED THEIR PHARMACY?  yes  IS THIS A 90 DAY SUPPLY : unknown  IS PATIENT OUT OF MEDICATION: NO  IF NOT; HOW MUCH IS LEFT: Tresiba 2-3 days  LAST APPOINTMENT DATE: @1 /02/204  NEXT APPOINTMENT DATE:@1 /07/2024  DO WE HAVE YOUR PERMISSION TO LEAVE A DETAILED MESSAGE?:  OTHER COMMENTS:    **Let patient know to contact pharmacy at the end of the day to make sure medication is ready. **  ** Please notify patient to allow 48-72 hours to process**  **Encourage patient to contact the pharmacy for refills or they can request refills through [facility]**

## 2023-09-19 ENCOUNTER — Inpatient Hospital Stay: Payer: Medicare Other | Attending: Hematology

## 2023-09-19 DIAGNOSIS — K7689 Other specified diseases of liver: Secondary | ICD-10-CM | POA: Diagnosis not present

## 2023-09-19 DIAGNOSIS — C259 Malignant neoplasm of pancreas, unspecified: Secondary | ICD-10-CM

## 2023-09-19 DIAGNOSIS — R5383 Other fatigue: Secondary | ICD-10-CM | POA: Insufficient documentation

## 2023-09-19 DIAGNOSIS — Z7901 Long term (current) use of anticoagulants: Secondary | ICD-10-CM | POA: Insufficient documentation

## 2023-09-19 DIAGNOSIS — N132 Hydronephrosis with renal and ureteral calculous obstruction: Secondary | ICD-10-CM | POA: Insufficient documentation

## 2023-09-19 DIAGNOSIS — I82891 Chronic embolism and thrombosis of other specified veins: Secondary | ICD-10-CM | POA: Insufficient documentation

## 2023-09-19 DIAGNOSIS — I8500 Esophageal varices without bleeding: Secondary | ICD-10-CM | POA: Diagnosis not present

## 2023-09-19 DIAGNOSIS — Z794 Long term (current) use of insulin: Secondary | ICD-10-CM | POA: Diagnosis not present

## 2023-09-19 DIAGNOSIS — Z5111 Encounter for antineoplastic chemotherapy: Secondary | ICD-10-CM | POA: Diagnosis present

## 2023-09-19 DIAGNOSIS — I2510 Atherosclerotic heart disease of native coronary artery without angina pectoris: Secondary | ICD-10-CM | POA: Insufficient documentation

## 2023-09-19 DIAGNOSIS — R590 Localized enlarged lymph nodes: Secondary | ICD-10-CM | POA: Diagnosis not present

## 2023-09-19 DIAGNOSIS — R918 Other nonspecific abnormal finding of lung field: Secondary | ICD-10-CM | POA: Insufficient documentation

## 2023-09-19 DIAGNOSIS — Z79899 Other long term (current) drug therapy: Secondary | ICD-10-CM | POA: Diagnosis not present

## 2023-09-19 DIAGNOSIS — I700 Atherosclerosis of aorta: Secondary | ICD-10-CM | POA: Diagnosis not present

## 2023-09-19 DIAGNOSIS — R197 Diarrhea, unspecified: Secondary | ICD-10-CM | POA: Insufficient documentation

## 2023-09-19 DIAGNOSIS — E860 Dehydration: Secondary | ICD-10-CM

## 2023-09-19 DIAGNOSIS — Z95828 Presence of other vascular implants and grafts: Secondary | ICD-10-CM

## 2023-09-19 MED ORDER — SODIUM CHLORIDE 0.9% FLUSH
10.0000 mL | Freq: Once | INTRAVENOUS | Status: AC
  Administered 2023-09-19: 10 mL

## 2023-09-19 MED ORDER — HEPARIN SOD (PORK) LOCK FLUSH 100 UNIT/ML IV SOLN
500.0000 [IU] | Freq: Once | INTRAVENOUS | Status: AC
Start: 2023-09-19 — End: 2023-09-19
  Administered 2023-09-19: 500 [IU]

## 2023-09-30 MED FILL — Fosaprepitant Dimeglumine For IV Infusion 150 MG (Base Eq): INTRAVENOUS | Qty: 5 | Status: AC

## 2023-10-01 ENCOUNTER — Inpatient Hospital Stay: Payer: Medicare Other

## 2023-10-01 ENCOUNTER — Inpatient Hospital Stay (HOSPITAL_BASED_OUTPATIENT_CLINIC_OR_DEPARTMENT_OTHER): Payer: Medicare Other | Admitting: Nurse Practitioner

## 2023-10-01 ENCOUNTER — Other Ambulatory Visit: Payer: Self-pay

## 2023-10-01 VITALS — BP 116/82 | HR 80 | Temp 97.8°F | Resp 18 | Ht 71.0 in | Wt 191.9 lb

## 2023-10-01 DIAGNOSIS — Z5111 Encounter for antineoplastic chemotherapy: Secondary | ICD-10-CM | POA: Diagnosis not present

## 2023-10-01 DIAGNOSIS — C259 Malignant neoplasm of pancreas, unspecified: Secondary | ICD-10-CM

## 2023-10-01 DIAGNOSIS — Z95828 Presence of other vascular implants and grafts: Secondary | ICD-10-CM

## 2023-10-01 DIAGNOSIS — E860 Dehydration: Secondary | ICD-10-CM

## 2023-10-01 LAB — CBC WITH DIFFERENTIAL (CANCER CENTER ONLY)
Abs Immature Granulocytes: 0.02 10*3/uL (ref 0.00–0.07)
Basophils Absolute: 0.1 10*3/uL (ref 0.0–0.1)
Basophils Relative: 1 %
Eosinophils Absolute: 0.2 10*3/uL (ref 0.0–0.5)
Eosinophils Relative: 3 %
HCT: 36.9 % — ABNORMAL LOW (ref 39.0–52.0)
Hemoglobin: 13.2 g/dL (ref 13.0–17.0)
Immature Granulocytes: 0 %
Lymphocytes Relative: 19 %
Lymphs Abs: 1.3 10*3/uL (ref 0.7–4.0)
MCH: 34.6 pg — ABNORMAL HIGH (ref 26.0–34.0)
MCHC: 35.8 g/dL (ref 30.0–36.0)
MCV: 96.9 fL (ref 80.0–100.0)
Monocytes Absolute: 0.6 10*3/uL (ref 0.1–1.0)
Monocytes Relative: 9 %
Neutro Abs: 4.6 10*3/uL (ref 1.7–7.7)
Neutrophils Relative %: 68 %
Platelet Count: 107 10*3/uL — ABNORMAL LOW (ref 150–400)
RBC: 3.81 MIL/uL — ABNORMAL LOW (ref 4.22–5.81)
RDW: 15.5 % (ref 11.5–15.5)
WBC Count: 6.7 10*3/uL (ref 4.0–10.5)
nRBC: 0 % (ref 0.0–0.2)

## 2023-10-01 LAB — CMP (CANCER CENTER ONLY)
ALT: 21 U/L (ref 0–44)
AST: 18 U/L (ref 15–41)
Albumin: 3.9 g/dL (ref 3.5–5.0)
Alkaline Phosphatase: 108 U/L (ref 38–126)
Anion gap: 4 — ABNORMAL LOW (ref 5–15)
BUN: 16 mg/dL (ref 8–23)
CO2: 28 mmol/L (ref 22–32)
Calcium: 9.4 mg/dL (ref 8.9–10.3)
Chloride: 107 mmol/L (ref 98–111)
Creatinine: 0.87 mg/dL (ref 0.61–1.24)
GFR, Estimated: >60 mL/min (ref >60)
Glucose, Bld: 233 mg/dL — ABNORMAL HIGH (ref 70–99)
Potassium: 3.9 mmol/L (ref 3.5–5.1)
Sodium: 139 mmol/L (ref 135–145)
Total Bilirubin: 0.8 mg/dL (ref <1.2)
Total Protein: 6.6 g/dL (ref 6.5–8.1)

## 2023-10-01 MED ORDER — FOSAPREPITANT DIMEGLUMINE INJECTION 150 MG
150.0000 mg | Freq: Once | INTRAVENOUS | Status: AC
  Administered 2023-10-01: 150 mg via INTRAVENOUS
  Filled 2023-10-01: qty 150

## 2023-10-01 MED ORDER — PANCRELIPASE (LIP-PROT-AMYL) 36000-114000 UNITS PO CPEP: ORAL_CAPSULE | ORAL | 11 refills | Status: DC

## 2023-10-01 MED ORDER — FLUOROURACIL CHEMO INJECTION 5 GM/100ML
3100.0000 mg | INTRAVENOUS | Status: DC
  Administered 2023-10-01: 3100 mg via INTRAVENOUS
  Filled 2023-10-01: qty 62

## 2023-10-01 MED ORDER — TRAMADOL HCL 50 MG PO TABS
50.0000 mg | ORAL_TABLET | Freq: Four times a day (QID) | ORAL | 0 refills | Status: DC | PRN
Start: 2023-10-01 — End: 2023-10-22

## 2023-10-01 MED ORDER — HEPARIN SOD (PORK) LOCK FLUSH 100 UNIT/ML IV SOLN: 500.0000 [IU] | Freq: Once | INTRAVENOUS | Status: DC | PRN

## 2023-10-01 MED ORDER — PALONOSETRON HCL INJECTION 0.25 MG/5ML
0.2500 mg | Freq: Once | INTRAVENOUS | Status: AC
  Administered 2023-10-01: 0.25 mg via INTRAVENOUS
  Filled 2023-10-01: qty 5

## 2023-10-01 MED ORDER — SODIUM CHLORIDE 0.9% FLUSH: 10.0000 mL | INTRAVENOUS | Status: DC | PRN

## 2023-10-01 MED ORDER — SODIUM CHLORIDE 0.9 % IV SOLN
400.0000 mg/m2 | Freq: Once | INTRAVENOUS | Status: AC
  Administered 2023-10-01: 852 mg via INTRAVENOUS
  Filled 2023-10-01: qty 42.6

## 2023-10-01 MED ORDER — SODIUM CHLORIDE 0.9 % IV SOLN: Freq: Once | INTRAVENOUS | Status: AC

## 2023-10-01 MED ORDER — IRINOTECAN HCL LIPOSOME CHEMO INJECTION 43 MG/10ML
40.0000 mg/m2 | INJECTION | Freq: Once | INTRAVENOUS | Status: AC
  Administered 2023-10-01: 86 mg via INTRAVENOUS
  Filled 2023-10-01: qty 20

## 2023-10-01 MED ORDER — SODIUM CHLORIDE 0.9% FLUSH
10.0000 mL | Freq: Once | INTRAVENOUS | Status: AC
  Administered 2023-10-01: 10 mL

## 2023-10-01 MED ORDER — ATROPINE SULFATE 1 MG/ML IV SOLN
0.5000 mg | Freq: Once | INTRAVENOUS | Status: AC | PRN
  Administered 2023-10-01: 0.5 mg via INTRAVENOUS
  Filled 2023-10-01: qty 1

## 2023-10-01 MED ORDER — DEXAMETHASONE SODIUM PHOSPHATE 10 MG/ML IJ SOLN
10.0000 mg | Freq: Once | INTRAMUSCULAR | Status: AC
  Administered 2023-10-01: 10 mg via INTRAVENOUS
  Filled 2023-10-01: qty 1

## 2023-10-01 MED ORDER — ZOLPIDEM TARTRATE 10 MG PO TABS: 10.0000 mg | ORAL_TABLET | Freq: Every evening | ORAL | 0 refills | Status: DC | PRN

## 2023-10-01 MED ORDER — ALPRAZOLAM 0.5 MG PO TABS: 0.5000 mg | ORAL_TABLET | Freq: Three times a day (TID) | ORAL | 1 refills | Status: DC | PRN

## 2023-10-01 NOTE — Patient Instructions (Signed)
[facility] - A DEPT OF [facility]  Discharge Instructions: Thank you for choosing [facility] to provide your oncology and hematology care.   If you have a lab appointment with the Cancer Center, please go directly to the Cancer Center and check in at the registration area.   Wear comfortable clothing and clothing appropriate for easy access to any Portacath or PICC line.   We strive to give you quality time with your provider. You may need to reschedule your appointment if you arrive late (15 or more minutes).  Arriving late affects you and other patients whose appointments are after yours.  Also, if you miss three or more appointments without notifying the office, you may be dismissed from the clinic at the provider's discretion.      For prescription refill requests, have your pharmacy contact our office and allow 72 hours for refills to be completed.    Today you received the following chemotherapy and/or immunotherapy agents: Irinotecan liposomal, Leucovorin, 5FU      To help prevent nausea and vomiting after your treatment, we encourage you to take your nausea medication as directed.  BELOW ARE SYMPTOMS THAT SHOULD BE REPORTED IMMEDIATELY: *FEVER GREATER THAN 100.4 F (38 C) OR HIGHER *CHILLS OR SWEATING *NAUSEA AND VOMITING THAT IS NOT CONTROLLED WITH YOUR NAUSEA MEDICATION *UNUSUAL SHORTNESS OF BREATH *UNUSUAL BRUISING OR BLEEDING *URINARY PROBLEMS (pain or burning when urinating, or frequent urination) *BOWEL PROBLEMS (unusual diarrhea, constipation, pain near the anus) TENDERNESS IN MOUTH AND THROAT WITH OR WITHOUT PRESENCE OF ULCERS (sore throat, sores in mouth, or a toothache) UNUSUAL RASH, SWELLING OR PAIN  UNUSUAL VAGINAL DISCHARGE OR ITCHING   Items with * indicate a potential emergency and should be followed up as soon as possible or go to the Emergency Department if any problems should occur.  Please show the CHEMOTHERAPY  ALERT CARD or IMMUNOTHERAPY ALERT CARD at check-in to the Emergency Department and triage nurse.  Should you have questions after your visit or need to cancel or reschedule your appointment, please contact [facility] - A DEPT OF [facility]  Dept: [phone]  and follow the prompts.  Office hours are 8:00 a.m. to 4:30 p.m. Monday - Friday. Please note that voicemails left after 4:00 p.m. may not be returned until the following business day.  We are closed weekends and major holidays. You have access to a nurse at all times for urgent questions. Please call the main number to the clinic Dept: [phone] and follow the prompts.   For any non-urgent questions, you may also contact your provider using MyChart. We now offer e-Visits for anyone 109 and older to request care online for non-urgent symptoms. For details visit [email or web address].   Also download the MyChart app! Go to the app store, search "MyChart", open the app, select [facility], and log in with your MyChart username and password.

## 2023-10-01 NOTE — Progress Notes (Addendum)
Patient Care Team: Lucky Cowboy, MD as PCP - General (Internal Medicine) Pollyann Samples, NP as PCP - Hematology/Oncology (Nurse Practitioner) Thomasene Ripple, DO as PCP - Cardiology (Cardiology) Nadara Mustard, MD as Consulting Physician (Orthopedic Surgery) Laurey Morale, MD as Consulting Physician (Cardiology) Malachy Mood, MD as Consulting Physician (Oncology) Pickenpack-Cousar, Arty Baumgartner, NP as Nurse Practitioner (Hospice and Palliative Medicine) Pickenpack-Cousar, Arty Baumgartner, NP as Nurse Practitioner [facility] and Palliative Medicine)  Clinic Day:  10/01/2023  Referring physician: Malachy Mood, MD  ASSESSMENT & PLAN:   Assessment & Plan: 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 -Baseline CA 19-9 elevated to 273 on 07/03/21 -He began first-line mFOLFIRINOX on 08/13/21, but required dose reduction and move to every 3 weeks. -his CA 19-9 trended up but is now stable. -restaging CT CAP 08/12/22 showed: multiple new and enlarging pulmonary metastases; pancreatic mass grossly stable. Will repeat in 3 months. -we changed his irinotecan to liposomal on 09/05/22. He tolerated better and feels stronger. -Due to disease progression, his treatment was changed to gemcitabine and Abraxane every 2 weeks on 01/02/2023 -He tolerated the first two cycles chemotherapy very well with mild fatigue. Gemcitabine increased to 1000mg /m2 from cycle 3 -Restaging CT scan 03/12/2023 showed stable disease overall. His primary pancreatic tumor is slightly increased in size, however his multiple cavitary lung nodules are slightly improved with thinner walls now.   -will continue current therapy.  He is overall tolerating well, but he has developed worsening fatigue lately.  Gemcitabine dose thus reduced to 800 mg/m again. -His tumor marker CA 19.9 continues  trending down, which is a good clinical sign. -restaging CT 07/22/2023 showed moderate disease progression at the primary site and lung mets.  -treatment changed to liposomal irinotecan and 5-FU pump infusion every 2 weeks on 08/07/2023 with dose reduction due to fatigue  -He understands the goal of chemotherapy is palliative, to prolong his life. -10/01/2023, he presents for Cycle 5, Day 1 liposomal irinotecan and 5-fu pump.  --dose previously reduced due to fatigue. Continue with current dose chemotherapy. Add Creon 36000 units TID with meals to help with diarrhea.  --restaging scan to be done prior to next visit/treatment, currently scheduled for 10/22/2023    Plan: Labs reviewed  -CBC showing WBC 6.7; Hgb 13.2; Hct 36.9; Plt 107; Anc 4.6 -CMP - K 3.9; glucose 233; BUN 16; Creatinine 0.87; eGFR >60; Ca 9.4; LFTs normal.   -Ca 19.9 pending -Plan to rescan prior to next treatment date to evaluate disease progression and plan for further treatment  Diarrhea -irinotecan dose already reduced. Difficult to reduce more without losing efficacy of medication. Trial Creon 36000 units TID with meals. May continue to take Tums and Pepto-Bismol as needed for intestinal cramping and diarrhea. He understands that he can use OTC imodium, lomotil, and/or hyoscyamine as needed to help with diarrhea, bloating, and cramping. He reports avoiding these medications as they cause constipation.  Labs/flush, follow up, and treatment as scheduled on 10/22/2023.   The patient understands the plans discussed today and is in agreement with them.  He knows to contact our office if he develops concerns prior to his next appointment.  I provided 30 minutes of face-to-face time during this encounter and > 50% was spent counseling as documented under my assessment and plan.    Carlean Jews, NP  [facility] - A [facility]PT [facility] [address] [place]  [place] [address] Dept: [phone] Dept Fax: [phone]   Orders Placed This Encounter  Procedures   CBC with Differential (Cancer Center Only)    Standing Status:   Future    Standing Expiration Date:   12/03/2024   CMP (Cancer Center only)    Standing Status:   Future    Standing Expiration Date:   12/03/2024      CHIEF COMPLAINT:  CC: f/u metastatic pancreatic cancer   Current Treatment:   Liposomal irinotecan and 5-FU pump infusion every 2 weeks   INTERVAL HISTORY:  Zachary Reid is here today for repeat clinical assessment. He reports having pretty significant diarrhea. He states that this is accompanied by gas, bloating, and intestinal cramping. He reports this going on for past several cycles of treatment. Having diarrhea with cramping after every time he eats. He reports that his appetite is normal, even good, but he avoids eating to avoid the side effect of diarrhea. His dose irinotecan has already been reduced due to fatigue and diarrhea. His weight has decreased 4 pounds over last 2 weeks . He denies chest pain, chest pressure, or shortness of breath. He denies headaches or visual disturbances.   I have reviewed the past medical history, past surgical history, social history and family history with the patient and they are unchanged from previous note.  ALLERGIES:  is allergic to lipitor [atorvastatin].  MEDICATIONS:  Current Outpatient Medications  Medication Sig Dispense Refill   baclofen (LIORESAL) 10 MG tablet Take 1 tablet (10 mg total) by mouth 3 (three) times daily as needed for muscle spasms. 30 each 0   Cholecalciferol (VITAMIN D3) 125 MCG (5000 UT) CAPS Take 5,000 Units by mouth in the morning and at bedtime.     Continuous Glucose Sensor (DEXCOM G7 SENSOR) MISC 1 Device by Does not apply route as directed. Change sensor every 10 days 3 each 3   diphenoxylate-atropine (LOMOTIL) 2.5-0.025 MG tablet Take 1-2 tablets by mouth 4 (four) times daily as needed for diarrhea or loose  stools. 30 tablet 0   gabapentin (NEURONTIN) 600 MG tablet Take 1 tablet (600 mg total) by mouth 3 (three) times daily as needed (pain). 90 tablet 0   glucose blood test strip Use as instructed 100 each 12   hyoscyamine (LEVSIN SL) 0.125 MG SL tablet DISSOLVE ONE TABLET UNDER THE TONGUE 4 TIMES DAILY UP TO EVERY 4 HOURS AS NEEDED FOR NAUSEA, BLOATING, CRAMPING, OR DIARRHEA 100 tablet 0   insulin degludec (TRESIBA FLEXTOUCH) 100 UNIT/ML FlexTouch Pen Inject 32 Units into the skin daily. 15 mL 1   insulin lispro (HUMALOG KWIKPEN) 100 UNIT/ML KwikPen 10 TO 15 UNITS BEFORE MEALS AS DIRECTED 15 mL 0   lipase/protease/amylase (CREON) 36000 UNITS CPEP capsule Take 1 capsule (36,000 Units total) by mouth 3 (three) times daily with meals. May also take 1 capsule (36,000 Units total) as needed (with snacks - up to 4 snacks daily). 150 capsule 11   metoprolol succinate (TOPROL XL) 25 MG 24 hr tablet Take 0.5 tablets (12.5 mg total) by mouth daily. 45 tablet 3   mirtazapine (REMERON) 7.5 MG tablet Take 1 tablet (7.5 mg total) by mouth at bedtime. 90 tablet 1   ondansetron (ZOFRAN) 8 MG tablet Take 1 tablet (8 mg total) by mouth every 8 (eight) hours as needed for nausea or vomiting. 40 tablet 1   oxyCODONE (ROXICODONE) 5 MG immediate release tablet  Take 1 tablet (5 mg total) by mouth every 6 (six) hours as needed for severe pain. 30 tablet 0   prochlorperazine (COMPAZINE) 10 MG tablet Take 1 tablet (10 mg total) by mouth every 6 (six) hours as needed (Nausea or vomiting). 60 tablet 1   rivaroxaban (XARELTO) 20 MG TABS tablet Take 1 tablet (20 mg total) by mouth daily with supper. 90 tablet 3   silver sulfADIAZINE (SILVADENE) 1 % cream Apply 1 Application topically daily. 20 g 0   ALPRAZolam (XANAX) 0.5 MG tablet Take 1 tablet (0.5 mg total) by mouth 3 (three) times daily as needed for anxiety. 60 tablet 1   traMADol (ULTRAM) 50 MG tablet Take 1-2 tablets (50-100 mg total) by mouth every 6 (six) hours as needed.  120 tablet 0   zolpidem (AMBIEN) 10 MG tablet Take 1 tablet (10 mg total) by mouth at bedtime as needed. for sleep 60 tablet 0   No current facility-administered medications for this visit.   Facility-Administered Medications Ordered in Other Visits  Medication Dose Route Frequency Provider Last Rate Last Admin   atropine injection 0.5 mg  0.5 mg Intravenous Once PRN Malachy Mood, MD       fluorouracil (ADRUCIL) 3,100 mg in sodium chloride 0.9 % 88 mL chemo infusion  3,100 mg Intravenous 1 day or 1 dose Malachy Mood, MD       heparin lock flush 100 unit/mL  500 Units Intracatheter Once PRN Malachy Mood, MD       irinotecan LIPOSOME (ONIVYDE) 86 mg in sodium chloride 0.9 % 500 mL chemo infusion  40 mg/m2 (Treatment Plan Recorded) Intravenous Once Malachy Mood, MD       leucovorin 852 mg in sodium chloride 0.9 % 250 mL infusion  400 mg/m2 (Treatment Plan Recorded) Intravenous Once Malachy Mood, MD       sodium chloride flush (NS) 0.9 % injection 10 mL  10 mL Intracatheter PRN Malachy Mood, MD        HISTORY OF PRESENT ILLNESS:   Oncology History Overview Note   Cancer Staging  Pancreatic adenocarcinoma [facility]) Staging form: Exocrine Pancreas, AJCC 8th Edition - Clinical stage from 07/26/2021: Stage IV (cT4, cN0, cM1) - Signed by Malachy Mood, MD on 07/28/2021    Pancreatic adenocarcinoma (HCC)  07/02/2021 Initial Diagnosis   Pancreatic adenocarcinoma (HCC)   07/26/2021 Cancer Staging   Staging form: Exocrine Pancreas, AJCC 8th Edition - Clinical stage from 07/26/2021: Stage IV (cT4, cN0, cM1) - Signed by Malachy Mood, MD on 07/28/2021 Stage prefix: Initial diagnosis Total positive nodes: 0   08/13/2021 - 07/06/2022 Chemotherapy   Patient is on Treatment Plan : PANCREAS Modified FOLFIRINOX q14d x 4 cycles      Genetic Testing   [facility] CancerNext-Expanded results (77 genes) were negative. No pathogenic variants were identified. 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 The report date  is 08/15/2021.    The CancerNext-Expanded gene panel offered by [facility] and includes sequencing, rearrangement, and RNA analysis for the following 77 genes: AIP, ALK, APC, ATM, AXIN2, BAP1, BARD1, BLM, BMPR1A, BRCA1, BRCA2, BRIP1, CDC73, CDH1, CDK4, CDKN1B, CDKN2A, CHEK2, CTNNA1, DICER1, FANCC, FH, FLCN, GALNT12, KIF1B, LZTR1, MAX, MEN1, MET, MLH1, MSH2, MSH3, MSH6, MUTYH, NBN, NF1, NF2, NTHL1, PALB2, PHOX2B, PMS2, POT1, PRKAR1A, PTCH1, PTEN, RAD51C, RAD51D, RB1, RECQL, RET, SDHA, SDHAF2, SDHB, SDHC, SDHD, SMAD4, SMARCA4, SMARCB1, SMARCE1, STK11, SUFU, TMEM127, TP53, TSC1, TSC2, VHL and XRCC2 (sequencing and deletion/duplication); EGFR, EGLN1, HOXB13, KIT, MITF, PDGFRA,  POLD1, and POLE (sequencing only); EPCAM and GREM1 (deletion/duplication only).     08/13/2021 - 12/06/2022 Chemotherapy   Patient is on Treatment Plan : PANCREAS Modified FOLFIRINOX q14d x 4 cycles     10/25/2021 Imaging   EXAM: CT CHEST, ABDOMEN, AND PELVIS WITH CONTRAST  IMPRESSION: 1. Innumerable bilateral pulmonary nodules, many of which are cavitary, consistent with metastatic disease. These nodules show no substantial change in are minimally progressed in the interval. 2. Mix cystic and solid lesion in the head and body of the pancreas is similar to prior and also comparing back to MRI 07/02/2021. 3. Hepatic cysts. 4. 8 mm nonobstructing left renal stone. 5. Aortic Atherosclerosis (ICD10-I70.0).   01/28/2022 Imaging   EXAM: CT CHEST, ABDOMEN, AND PELVIS WITH CONTRAST  IMPRESSION: 1. Innumerable bilateral small solid and cavitary pulmonary nodules, some of the cavitary nodules are slightly less thick-walled when compared with prior exam. 2. Mixed cystic and solid lesion in the head and body of the pancreas is similar to prior exam. 3. Nonobstructing left renal stone. 4.  Aortic Atherosclerosis (ICD10-I70.0).   11/29/2022 Imaging    IMPRESSION: 1. No significant change in primary pancreatic mass and  adjacent cystic component. Unchanged appearance of soft tissue extending to involve the adjacent celiac axis, superior mesenteric artery origin, and portal confluence. 2. 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 3. Innumerable small pulmonary nodules throughout the lungs, the majority of which are cavitary. Although interval change is difficult to appreciate for the majority of these nodules due to small size, at least some of these are slightly enlarged, consistent with worsened pulmonary metastatic disease. 4. Newly enlarged left retroperitoneal lymph nodes, which may reflect nodal metastatic disease or perhaps reactive to left hydronephrosis. Attention on follow-up. 5. A sizable calculus previously seen in the left renal pelvis has migrated to the middle third of the left ureter, with placement of a double-J left ureteral stent, with formed pigtails in the left renal pelvis and urinary bladder. Moderate left hydronephrosis and proximal hydroureter. 6. Coronary artery disease.   Aortic Atherosclerosis (ICD10-I70.0).   01/02/2023 - 06/26/2023 Chemotherapy   Patient is on Treatment Plan : PANCREATIC Abraxane D1,8,15 + Gemcitabine D1,8,15 q28d     03/12/2023 Imaging    IMPRESSION: 1. Primary pancreatic mass with adjacent cystic component is slightly increased in size when compared with the prior exam. Similar soft tissue extending involve the celiac axis superior mesenteric artery origin and portal confluence. 2. Innumerable bilateral pulmonary nodules, some of which are cavitary, unchanged when compared with the prior. 3. Left retroperitoneal lymph nodes are decreased in size, likely resolving reactive adenopathy 4. Interval removal of left ureter stent.  No hydronephrosis. 5. coronary artery disease and aortic Atherosclerosis (ICD10-I70.0).   07/22/2023 Imaging   CT CAP W CONTRAST   IMPRESSION: 1.  Today's study demonstrates progression of disease as evidenced by enlargement of the primary pancreatic mass, mild enlargement of numerous prominent borderline enlarged and mildly enlarged upper abdominal and retroperitoneal lymph nodes, and increased number and size of numerous metastatic nodules scattered throughout the lungs bilaterally, as detailed above. 2. 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 Main portal vein remains patent at this time. 3. 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 4. Nonobstructive nephrolithiasis in the kidneys bilaterally measuring 2-4 mm in size. 5. Aortic atherosclerosis, in addition to left main and three-vessel  coronary artery disease. 6. Additional incidental findings, as above.   08/07/2023 -  Chemotherapy   Patient is on Treatment Plan : PANCREAS Liposomal Irinotecan + Leucovorin + 5-FU IVCI q14d         REVIEW OF SYSTEMS:   Constitutional: Denies fevers or chills. Has had 4 pound weight loss since last cycle.  Eyes: Denies blurriness of vision Ears, nose, mouth, throat, and face: Denies mucositis or sore throat Respiratory: Denies cough, dyspnea or wheezes Cardiovascular: Denies palpitation, chest discomfort or lower extremity swelling Gastrointestinal:  Denies nausea. Has been having intestinal cramping, gas, and diarrhea with every meal. Skin: Denies abnormal skin rashes Lymphatics: Denies new lymphadenopathy or easy bruising Neurological:Denies numbness, tingling or new weaknesses Behavioral/Psych: Mood is stable, no new changes  All other systems were reviewed with the patient and are negative.   VITALS:   Today's Vitals   10/01/23 1134 10/01/23 1148  BP: 116/82   Pulse: 80   Resp: 18   Temp: 97.8 F (36.6 C)   TempSrc: Temporal   SpO2: 99%   Weight: 191 lb 14.4 oz (87 kg)   Height: 5\' 11"  (1.803  m)   PainSc:  0-No pain   Body mass index is 26.76 kg/m.   Wt Readings from Last 3 Encounters:  10/01/23 191 lb 14.4 oz (87 kg)  09/17/23 195 lb 1.6 oz (88.5 kg)  09/05/23 197 lb 8 oz (89.6 kg)    Body mass index is 26.76 kg/m.  Performance status (ECOG): 1 - Symptomatic but completely ambulatory  PHYSICAL EXAM:   GENERAL:alert, no distress and comfortable SKIN: skin color, texture, turgor are normal, no rashes or significant lesions EYES: normal, Conjunctiva are pink and non-injected, sclera clear OROPHARYNX:no exudate, no erythema and lips, buccal mucosa, and tongue normal  NECK: supple, thyroid normal size, non-tender, without nodularity LYMPH:  no palpable lymphadenopathy in the cervical, axillary or inguinal LUNGS: clear to auscultation and percussion with normal breathing effort HEART: regular rate & rhythm and no murmurs and no lower extremity edema ABDOMEN:abdomen soft, non-tender and normal bowel sounds Musculoskeletal:no cyanosis of digits and no clubbing  NEURO: alert & oriented x 3 with fluent speech, no focal motor/sensory deficits  LABORATORY DATA:  I have reviewed the data as listed    Component Value Date/Time   NA 139 10/01/2023 1107   K 3.9 10/01/2023 1107   CL 107 10/01/2023 1107   CO2 28 10/01/2023 1107   GLUCOSE 233 (H) 10/01/2023 1107   BUN 16 10/01/2023 1107   CREATININE 0.87 10/01/2023 1107   CREATININE 0.88 06/05/2021 1130   CALCIUM 9.4 10/01/2023 1107   PROT 6.6 10/01/2023 1107   ALBUMIN 3.9 10/01/2023 1107   AST 18 10/01/2023 1107   ALT 21 10/01/2023 1107   ALKPHOS 108 10/01/2023 1107   BILITOT 0.8 10/01/2023 1107   GFRNONAA >60 10/01/2023 1107   GFRNONAA 96 03/05/2021 0915   GFRAA 112 03/05/2021 0915    Lab Results  Component Value Date   WBC 6.7 10/01/2023   NEUTROABS 4.6 10/01/2023   HGB 13.2 10/01/2023   HCT 36.9 (L) 10/01/2023   MCV 96.9 10/01/2023   PLT 107 (L) 10/01/2023   Addendum I have seen the patient, examined him.  I agree with the assessment and and plan and have edited the notes.   Pt has developed more diarrhea daily, especially right after heavy meals.  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  He is liposomal irinotecan dose has  been reduced significantly, will continue at this current total dose, and add on Creon to see if his diarrhea improves.  He will continue to use Imodium and Lomotil for diarrhea.  I recommended restaging CT scan next month, but he would like to wait after Christmas, will make that decision once we have his tumor marker back today.  All questions were answered.  I spent a total of 25 minutes for his visit today, more than 50% time on face-to-face counseling  Malachy Mood MD 10/01/2023

## 2023-10-01 NOTE — Assessment & Plan Note (Signed)
[id number] with numerous small cavitary lesions in bilateral lungs, MMR normal  -diagnosed 07/2021, after incidental finding on CT scan for kidney stone, by colonoscopy/EUS. -Baseline CA 19-9 elevated to 273 on 07/03/21 -He began first-line mFOLFIRINOX on 08/13/21, but required dose reduction and move to every 3 weeks. -his CA 19-9 trended up but is now stable. -restaging CT CAP 08/12/22 showed: multiple new and enlarging pulmonary metastases; pancreatic mass grossly stable. Will repeat in 3 months. -we changed his irinotecan to liposomal on 09/05/22. He tolerated better and feels stronger. -Due to disease progression, his treatment was changed to gemcitabine and Abraxane every 2 weeks on 01/02/2023 -He tolerated the first two cycles chemotherapy very well with mild fatigue. Gemcitabine increased to 1000mg /m2 from cycle 3 -Restaging CT scan 03/12/2023 showed stable disease overall. His primary pancreatic tumor is slightly increased in size, however his multiple cavitary lung nodules are slightly improved with thinner walls now.   -will continue current therapy.  He is overall tolerating well, but he has developed worsening fatigue lately.  Gemcitabine dose thus reduced to 800 mg/m again. -His tumor marker CA 19.9 continues trending down, which is a good clinical sign. -restaging CT 07/22/2023 showed moderate disease progression at the primary site and lung mets.  -treatment changed to liposomal irinotecan and 5-FU pump infusion every 2 weeks on 08/07/2023 with dose reduction due to fatigue  -He understands the goal of chemotherapy is palliative, to prolong his life. -10/01/2023, he presents for Cycle 5, Day 1 liposomal irinotecan and 5-fu pump.  --dose previously reduced due to fatigue. Continue with current dose chemotherapy. Add Creon 36000 units TID with meals to help with diarrhea.  --restaging scan to be done prior to next visit/treatment, currently scheduled for 10/22/2023

## 2023-10-02 LAB — CANCER ANTIGEN 19-9: CA 19-9: 815 U/mL — ABNORMAL HIGH (ref 0–35)

## 2023-10-03 ENCOUNTER — Inpatient Hospital Stay: Payer: Medicare Other

## 2023-10-03 ENCOUNTER — Telehealth: Payer: Self-pay

## 2023-10-03 DIAGNOSIS — Z5111 Encounter for antineoplastic chemotherapy: Secondary | ICD-10-CM | POA: Diagnosis not present

## 2023-10-03 DIAGNOSIS — C259 Malignant neoplasm of pancreas, unspecified: Secondary | ICD-10-CM

## 2023-10-03 MED ORDER — SODIUM CHLORIDE 0.9% FLUSH
10.0000 mL | INTRAVENOUS | Status: DC | PRN
  Administered 2023-10-03: 10 mL

## 2023-10-03 MED ORDER — HEPARIN SOD (PORK) LOCK FLUSH 100 UNIT/ML IV SOLN
500.0000 [IU] | Freq: Once | INTRAVENOUS | Status: AC | PRN
Start: 2023-10-03 — End: 2023-10-03
  Administered 2023-10-03: 500 [IU]

## 2023-10-03 NOTE — Telephone Encounter (Signed)
Have tried multiple times to reach patient to set up his CT CAPw, left a message to call us back or left the number to call scheduling directly. Karna Christmas in scheduling has been trying as well to reach him or his wife to set up this appointment.

## 2023-10-15 ENCOUNTER — Ambulatory Visit: Payer: Medicare Other | Admitting: Hematology

## 2023-10-15 ENCOUNTER — Ambulatory Visit: Payer: Medicare Other

## 2023-10-15 ENCOUNTER — Other Ambulatory Visit: Payer: Medicare Other

## 2023-10-17 IMAGING — CT CT CHEST-ABD-PELV W/ CM
3 of 10 series · 15 of 46 positions shown, 17 images · IV contrast (OMNIPAQUE)
Comparison: CT chest, abdomen and pelvis dated October 25, 2021

CLINICAL DATA: History of pancreatic cancer

EXAM:
CT CHEST, ABDOMEN, AND PELVIS WITH CONTRAST
TECHNIQUE: Multidetector CT imaging of the chest, abdomen and pelvis was
performed following the standard protocol during bolus
administration of intravenous contrast.

[Series 5: coronal arterial · coronal · arterial · 0.61mm/px · 3 of 98 slices shown]
[im 25/98  soft-tissue]
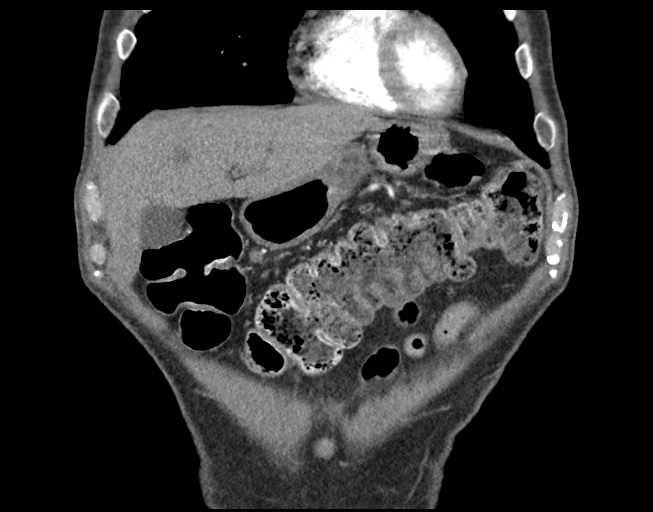
[im 49/98  soft-tissue]
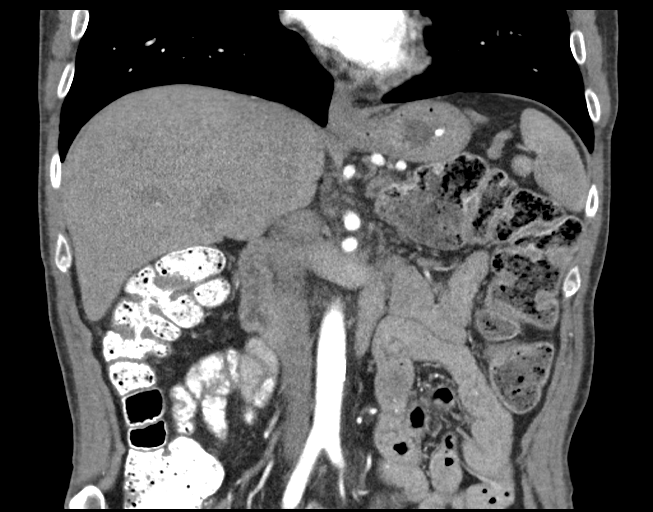
[im 73/98  soft-tissue]
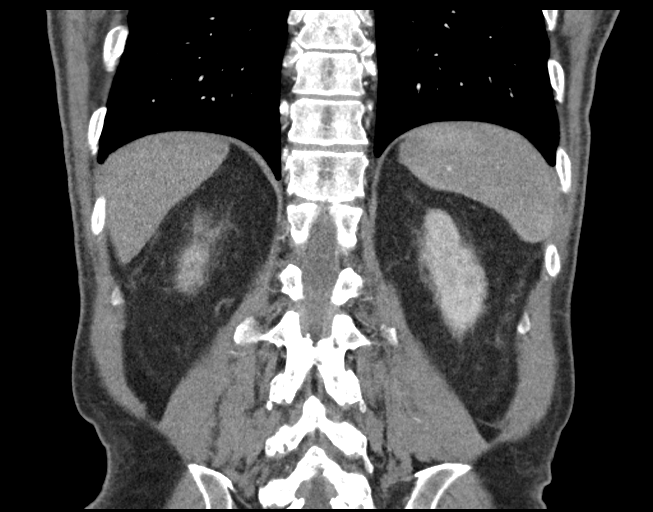

[Series 11: portal thin · axial · portal-venous · 0.75mm/px · z∈[-890,-306]mm · 10 of 358 slices shown, 12 images]
[im 33/358  soft-tissue]
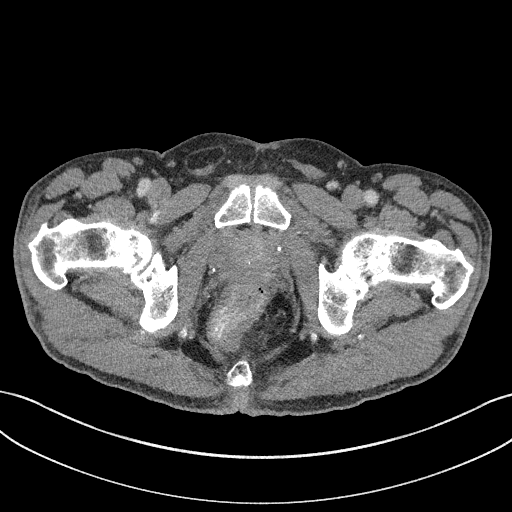
[im 33/358  bone]
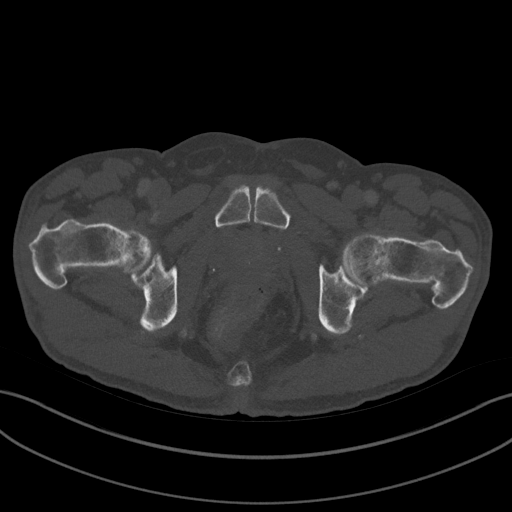
[im 65/358  soft-tissue]
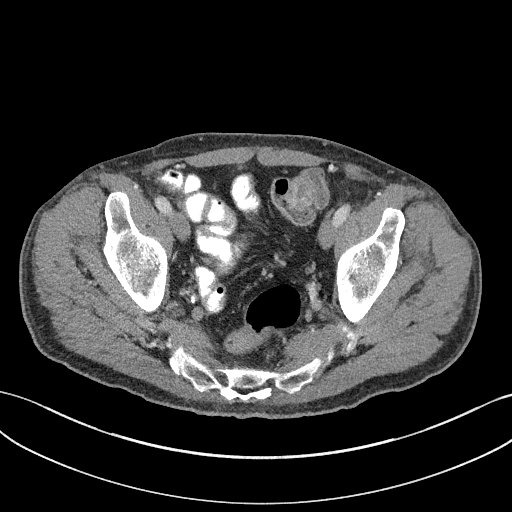
[im 98/358  soft-tissue]
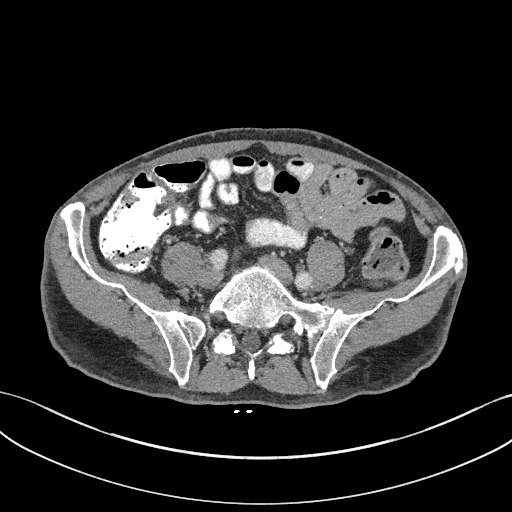
[im 130/358  soft-tissue]
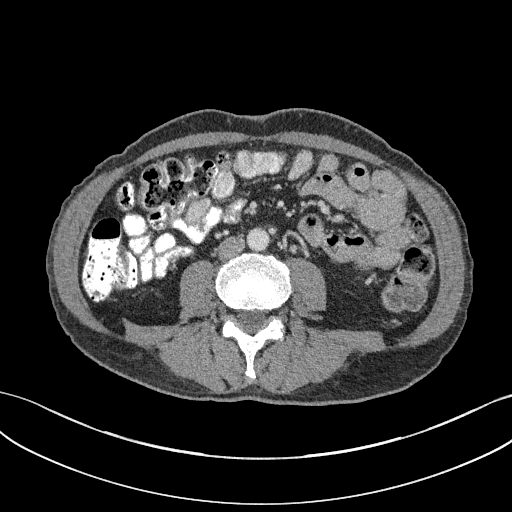
[im 163/358  soft-tissue]
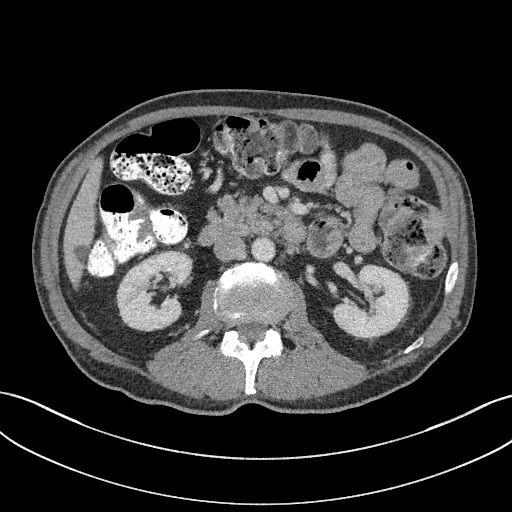
[im 195/358  soft-tissue]
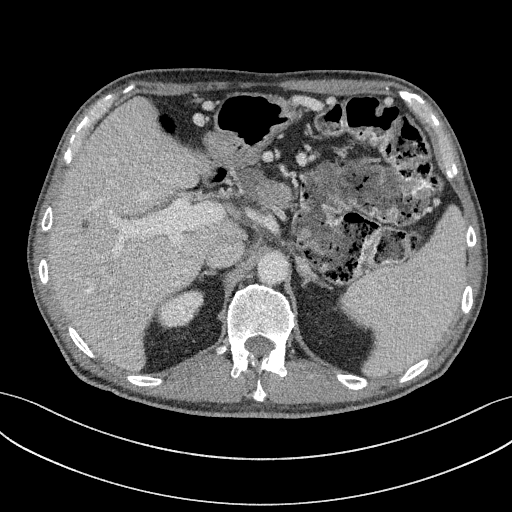
[im 228/358  soft-tissue]
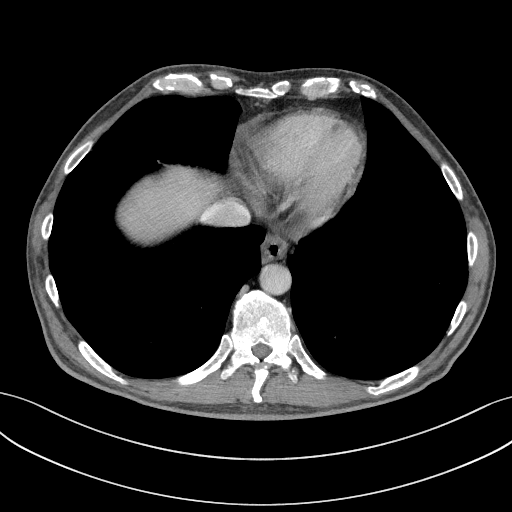
[im 260/358  soft-tissue]
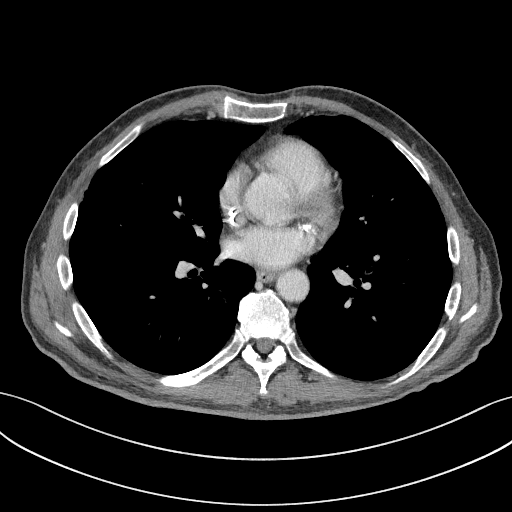
[im 293/358  soft-tissue]
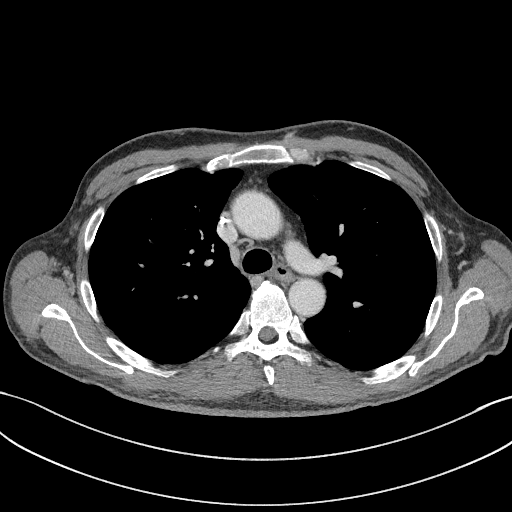
[im 293/358  bone]
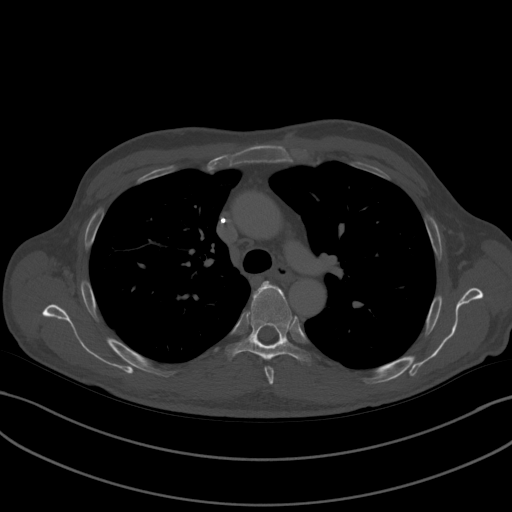
[im 325/358  soft-tissue]
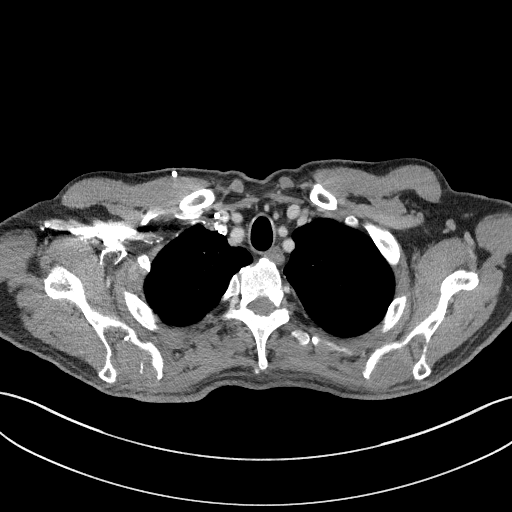

[Series 12: 2 lung portal venous · axial · portal-venous · 0.75mm/px · z∈[-510,-442]mm · 2 of 169 slices shown]
[im 34/169  bone]
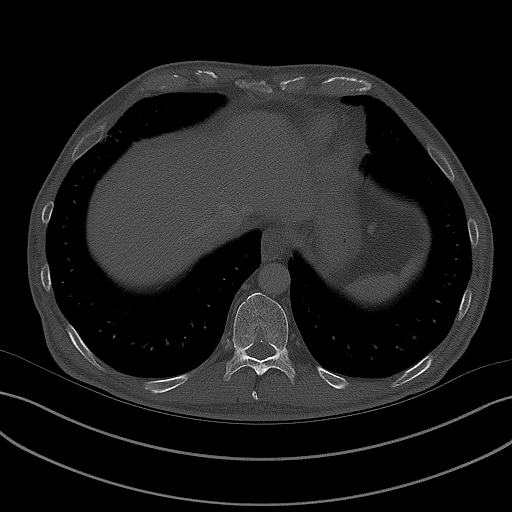
[im 68/169  bone]
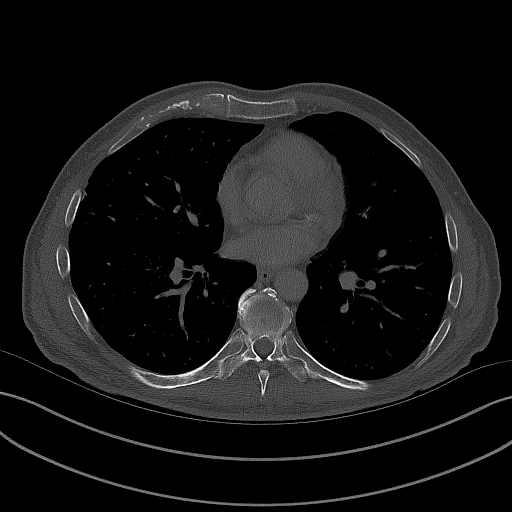

[15 of 46 positions shown; findings below may reference images not displayed]

RADIATION DOSE REDUCTION: This exam was performed according to the
departmental dose-optimization program which includes automated
exposure control, adjustment of the mA and/or kV according to
patient size and/or use of iterative reconstruction technique.

CONTRAST:  100mL OMNIPAQUE IOHEXOL 300 MG/ML  SOLN
FINDINGS: CT CHEST FINDINGS

Cardiovascular: Normal heart size. No pericardial effusion.
Three-vessel coronary artery calcifications. Mild atherosclerotic
disease of the thoracic aorta. Right chest wall port with tip near
the superior cavoatrial junction.

Mediastinum/Nodes: No pathologically enlarged lymph nodes in the
chest.

Lungs/Pleura: Central airways are patent. Numerous small cavitary
nodules are again seen throughout the lungs, many are less
thick-walled when compared with prior exam. Reference nodule of the
medial right lower lobe located on series 12, image 100.

Musculoskeletal: No aggressive appearing osseous lesions.

CT ABDOMEN PELVIS FINDINGS

Hepatobiliary: Cystic lesions are seen throughout the liver which
are unchanged when compared to prior exam and likely simple cysts.
No suspicious liver lesions. Gallbladder is unremarkable. No biliary
ductal dilation.

Pancreas: Mixed cystic and solid lesion in the head and body of
pancreas is similar in size when compared with prior exam, measuring
approximately 4.9 x 2.6 cm, previously 5.1 x 2.9 cm. Unchanged
occlusion the splenic vein. Similar circumferential soft tissue
stranding about the SMV and less than 180 degree abutment of the
celiac artery.

Spleen: Normal in size without focal abnormality.

Adrenals/Urinary Tract: Adrenal glands are unremarkable. No
hydronephrosis. Nonobstructing stone of the lower pole of the left
kidney. Bladder is unremarkable.

Stomach/Bowel: Stomach is within normal limits. No evidence of bowel
wall thickening, distention, or inflammatory changes.

Vascular/Lymphatic: Aortic atherosclerosis. No enlarged abdominal or
pelvic lymph nodes.

Reproductive: Mild prostatomegaly.

Other: Small fat containing right inguinal hernia. No abdominopelvic
ascites.

Musculoskeletal: No acute or significant osseous findings.
IMPRESSION: 1. Innumerable bilateral small solid and cavitary pulmonary nodules,
some of the cavitary nodules are slightly less thick-walled when
compared with prior exam.
2. Mixed cystic and solid lesion in the head and body of the
pancreas is similar to prior exam.
3. Nonobstructing left renal stone.
4.  Aortic Atherosclerosis ([id number]).

## 2023-10-17 NOTE — Progress Notes (Unsigned)
Palliative Medicine [facility]  [phone] [phone]   Name: Zachary Reid Date: 10/17/2023 MRN: [id number]  DOB: [birth year]  Patient Care Team: Lucky Cowboy, MD as PCP - General (Internal Medicine) Pollyann Samples, NP as PCP - Hematology/Oncology (Nurse Practitioner) Thomasene Ripple, DO as PCP - Cardiology (Cardiology) Nadara Mustard, MD as Consulting Physician (Orthopedic Surgery) Laurey Morale, MD as Consulting Physician (Cardiology) Malachy Mood, MD as Consulting Physician (Oncology) Pickenpack-Cousar, Arty Baumgartner, NP as Nurse Practitioner (Hospice and Palliative Medicine) Pickenpack-Cousar, Arty Baumgartner, NP as Nurse Practitioner [facility] and Palliative Medicine)    INTERVAL HISTORY: Zachary Reid is a 67 y.o. male with oncologic medical history including pancreatic adenocarcinoma (06/2021), HLD, diabetes, CKD, HTN, and anxiety. Palliative ask to see for symptom management and goals of care.     SOCIAL HISTORY:     reports that he has never smoked. He has never used smokeless tobacco. He reports that he does not drink alcohol and does not use drugs.  ADVANCE DIRECTIVES:  Advanced directives on file naming wife, Hendrik Kor, as patient healthcare power of attorney, and daughter, Ceola Mania as Counsellor.   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 tobb;   newly dx ED 10-31-2022 w/ RVR  in setting flank pain (kidney stone)  --  office note in epic 11-07-2022 normal ETT 11-13-2022, echo 11-07-2022 ef 65-70% w/ mild LVH, mild MR,  zio monitor not completed,  started on toprol and xarelto daily   Personal history of chemotherapy    Receiving chemotherapy for metastatic pancreatic cancer. Most recent infusion (as of 12/02/22) was on 11/15/22.   Port-A-Cath in place 08/10/2021   Type 2 diabetes mellitus treated with insulin [facility])    endocrinologist--- dr Lucianne Muss   Vitamin D deficiency    takes Vit D supplements   Wears contact lenses     ALLERGIES:  is allergic to lipitor [atorvastatin].  MEDICATIONS:  Current Outpatient Medications  Medication Sig Dispense Refill   ALPRAZolam (XANAX) 0.5 MG tablet Take 1 tablet (0.5 mg total) by mouth 3 (three) times daily as needed for anxiety. 60 tablet 1   baclofen (LIORESAL) 10 MG tablet Take 1 tablet (10 mg total) by mouth 3 (three) times daily as needed for muscle spasms. 30 each 0   Cholecalciferol (VITAMIN D3) 125 MCG (5000 UT) CAPS Take 5,000 Units by mouth in the morning and at bedtime.     Continuous Glucose Sensor (DEXCOM G7 SENSOR) MISC 1 Device by Does not apply route as directed. Change sensor every 10 days 3 each 3   diphenoxylate-atropine (LOMOTIL) 2.5-0.025 MG tablet Take 1-2 tablets by mouth 4 (four) times daily as needed for diarrhea or loose stools. 30 tablet 0   gabapentin (NEURONTIN) 600 MG tablet Take 1 tablet (600 mg total) by mouth 3 (three) times daily  as needed (pain). 90 tablet 0   glucose blood test strip Use as instructed 100 each 12   hyoscyamine (LEVSIN SL) 0.125 MG SL tablet DISSOLVE ONE TABLET UNDER THE TONGUE 4 TIMES DAILY UP TO EVERY 4 HOURS AS NEEDED FOR NAUSEA, BLOATING, CRAMPING, OR DIARRHEA 100 tablet 0   insulin degludec (TRESIBA FLEXTOUCH) 100 UNIT/ML FlexTouch Pen Inject 32 Units into the skin daily. 15 mL 1   insulin lispro (HUMALOG KWIKPEN) 100 UNIT/ML KwikPen 10 TO 15  UNITS BEFORE MEALS AS DIRECTED 15 mL 0   lipase/protease/amylase (CREON) 36000 UNITS CPEP capsule Take 1 capsule (36,000 Units total) by mouth 3 (three) times daily with meals. May also take 1 capsule (36,000 Units total) as needed (with snacks - up to 4 snacks daily). 150 capsule 11   metoprolol succinate (TOPROL XL) 25 MG 24 hr tablet Take 0.5 tablets (12.5 mg total) by mouth daily. 45 tablet 3   mirtazapine (REMERON) 7.5 MG tablet Take 1 tablet (7.5 mg total) by mouth at bedtime. 90 tablet 1   ondansetron (ZOFRAN) 8 MG tablet Take 1 tablet (8 mg total) by mouth every 8 (eight) hours as needed for nausea or vomiting. 40 tablet 1   oxyCODONE (ROXICODONE) 5 MG immediate release tablet Take 1 tablet (5 mg total) by mouth every 6 (six) hours as needed for severe pain. 30 tablet 0   prochlorperazine (COMPAZINE) 10 MG tablet Take 1 tablet (10 mg total) by mouth every 6 (six) hours as needed (Nausea or vomiting). 60 tablet 1   rivaroxaban (XARELTO) 20 MG TABS tablet Take 1 tablet (20 mg total) by mouth daily with supper. 90 tablet 3   silver sulfADIAZINE (SILVADENE) 1 % cream Apply 1 Application topically daily. 20 g 0   traMADol (ULTRAM) 50 MG tablet Take 1-2 tablets (50-100 mg total) by mouth every 6 (six) hours as needed. 120 tablet 0   zolpidem (AMBIEN) 10 MG tablet Take 1 tablet (10 mg total) by mouth at bedtime as needed. for sleep 60 tablet 0   No current facility-administered medications for this visit.    VITAL SIGNS: There were no vitals taken for this visit. There were no vitals filed for this visit.  Estimated body mass index is 26.76 kg/m as calculated from the following:   Height as of 10/01/23: 5\' 11"  (1.803 m).   Weight as of 10/01/23: 191 lb 14.4 oz (87 kg).   PERFORMANCE STATUS (ECOG) : 1 - Symptomatic but completely ambulatory   Physical Exam General: NAD Cardiovascular: regular rate and rhythm Pulmonary: normal breathing pattern  Extremities: no edema, no joint  deformities Skin: no rashes Neurological: alert and oriented x3  Discussed the use of AI scribe software for clinical note transcription with the patient, who gave verbal consent to proceed.   IMPRESSION: I saw Mr. Stockbridge during his infusion. No acute distress. Shares significant gastrointestinal distress following his last treatment. He experienced persistent diarrhea from the beginning to the end of the treatment cycle. The patient also reported feeling ill after eating, with symptoms severe enough to induce vomiting. The frequency of these episodes was initially high, with the patient becoming sick seven to eight times a day, but gradually decreased over time. By the day before the consultation, the patient only experienced one episode of illness.  In addition to gastrointestinal issues, the patient also reported some level of pain, which was managed with Tramadol. He noted that his Tramadol effectively controlled the pain, and on some days, he only  required one dose in the morning and one at night. Refill for his Tramadol has been sent in per Dr. Mosetta Putt. Despite these health issues, the patient reported sleeping well at night with the aid of Ambien.  The patient was advised to take Imodium AD to manage the diarrhea and was given a new prescription by Dr. Mosetta Putt for Lomotil today to use as needed. The patient was also given Atropine with his treatment today.   Mr. Cannizzaro continue to take things one day at a time. Is trying to remain as active as possible. Knows to contact office as needed.  PLAN: Chemotherapy-induced Diarrhea Persistent diarrhea following recent chemotherapy treatment. Discussed with Dr. Blake Divine who recommended Imodium AD and a new prescription. -Start Imodium AD as recommended by Dr. Mosetta Putt. -Continue with the new prescription of Lomotil as advised by Dr. Mosetta Putt. -Received atropine with treatment today.   Cancer-related Pain Pain managed with Tramadol, with some days requiring  only one dose in the morning and one at night. -Continue Tramadol as needed for pain. -Ensure refill of Tramadol as current supply is nearly depleted. Sent to pharmacy per Dr. Mosetta Putt   General Health Maintenance -Continue Ambien for sleep. -Report any new issues or concerns promptly. -We will plan to follow-up in 3-4 weeks as needed.  Patient expressed understanding and was in agreement with this plan. He also understands that He can call the clinic at any time with any questions, concerns, or complaints.   Any controlled substances utilized were prescribed in the context of palliative care. PDMP has been reviewed.    Visit consisted of counseling and education dealing with the complex and emotionally intense issues of symptom management and palliative care in the setting of serious and potentially life-threatening illness.  Willette Alma, AGPCNP-BC  Palliative Medicine Team/[facility]  *Please note that this is a verbal dictation therefore any spelling or grammatical errors are due to the "[employer]agon [employer] One" system interpretation.

## 2023-10-19 NOTE — Assessment & Plan Note (Addendum)
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 -Baseline CA 19-9 elevated to 273 on 07/03/21 -He began first-line mFOLFIRINOX on 08/13/21, but required dose reduction and move to every 3 weeks. -his CA 19-9 trended up but is now stable. -restaging CT CAP 08/12/22 showed: multiple new and enlarging pulmonary metastases; pancreatic mass grossly stable. Will repeat in 3 months. -we changed his irinotecan to liposomal on 09/05/22. He tolerated better and feels stronger. -Due to disease progression, his treatment was changed to gemcitabine and Abraxane every 2 weeks on 01/02/2023 -He tolerated the first two cycles chemotherapy very well with mild fatigue. Gemcitabine increased to 1000mg /m2 from cycle 3 -Restaging CT scan 03/12/2023 showed stable disease overall. His primary pancreatic tumor is slightly increased in size, however his multiple cavitary lung nodules are slightly improved with thinner walls now.   -will continue current therapy.  He is overall tolerating well, but he has developed worsening fatigue lately.  Gemcitabine dose thus reduced to 800 mg/m again. -His tumor marker CA 19.9 continues trending down, which is a good clinical sign. -restaging CT 07/22/2023 showed moderate disease progression at the primary site and lung mets.  -treatment changed to liposomal irinotecan and 5-FU pump infusion every 2 weeks on 08/07/2023 with dose reduction due to fatigue  -He understands the goal of chemotherapy is palliative, to prolong his life. -10/01/2023, he presents for Cycle 5, Day 1 liposomal irinotecan and 5-fu pump.  --dose previously reduced due to fatigue. Continue with current dose chemotherapy. Add Creon 36000 units TID with meals to help with diarrhea.  --restaging scan scheduled 10/20/2023.  Follow-up is set for 10/22/2023. -- Today is cycle 6 day 1

## 2023-10-19 NOTE — Progress Notes (Unsigned)
Patient Care Team: Lucky Cowboy, MD as PCP - General (Internal Medicine) Pollyann Samples, NP as PCP - Hematology/Oncology (Nurse Practitioner) Thomasene Ripple, DO as PCP - Cardiology (Cardiology) Nadara Mustard, MD as Consulting Physician (Orthopedic Surgery) Laurey Morale, MD as Consulting Physician (Cardiology) Malachy Mood, MD as Consulting Physician (Oncology) Pickenpack-Cousar, Arty Baumgartner, NP as Nurse Practitioner (Hospice and Palliative Medicine) Pickenpack-Cousar, Arty Baumgartner, NP as Nurse Practitioner [facility] and Palliative Medicine)  Clinic Day:  10/22/2023  Referring physician: Malachy Mood, MD  ASSESSMENT & PLAN:   Assessment & Plan: 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 -Baseline CA 19-9 elevated to 273 on 07/03/21 -He began first-line mFOLFIRINOX on 08/13/21, but required dose reduction and move to every 3 weeks. -his CA 19-9 trended up but is now stable. -restaging CT CAP 08/12/22 showed: multiple new and enlarging pulmonary metastases; pancreatic mass grossly stable. Will repeat in 3 months. -we changed his irinotecan to liposomal on 09/05/22. He tolerated better and feels stronger. -Due to disease progression, his treatment was changed to gemcitabine and Abraxane every 2 weeks on 01/02/2023 -He tolerated the first two cycles chemotherapy very well with mild fatigue. Gemcitabine increased to 1000mg /m2 from cycle 3 -Restaging CT scan 03/12/2023 showed stable disease overall. His primary pancreatic tumor is slightly increased in size, however his multiple cavitary lung nodules are slightly improved with thinner walls now.   -will continue current therapy.  He is overall tolerating well, but he has developed worsening fatigue lately.  Gemcitabine dose thus reduced to 800 mg/m again. -His tumor marker CA 19.9 continues  trending down, which is a good clinical sign. -restaging CT 07/22/2023 showed moderate disease progression at the primary site and lung mets.  -treatment changed to liposomal irinotecan and 5-FU pump infusion every 2 weeks on 08/07/2023 with dose reduction due to fatigue  -He understands the goal of chemotherapy is palliative, to prolong his life. -10/01/2023, he presents for Cycle 5, Day 1 liposomal irinotecan and 5-fu pump.  --dose previously reduced due to fatigue. Continue with current dose chemotherapy. Add Creon 36000 units TID with meals to help with diarrhea.  --restaging scan done 10/20/2023.  Preliminary results reviewed by Dr. Mosetta Putt. Appears to be some disease progression. If confirmed by radiology reading, will change chemotherapy to FOLFOX. Will start in early January 2025.  Patient is agreeable to this plan.  -- Today is cycle 6 day 1 of liposomal irinotecan and 5-fu pump.     Plan: Labs reviewed  -CBC showing WBC 5.1; Hgb 12.5; Hct 36.3; Plt 120; Anc 3.2 -CMP - K 3.4; glucose 214; BUN 13; Creatinine 0.83; eGFR > 60; Ca 9.1; LFTs normal.   -review of CT CAP done per Dr. Mosetta Putt.  Appears to have some disease progression. If confirmed by radiology report, will change chemotherapy to FOLFOX. Patient is agreeable to this plan and would like to proceed as recommended after the new year.  Today, proceed with liposomal irinotecan and 5-fu pump.  Labs/flush, follow up, and treatment starting in the new year. Will adjust treatment as indicated    The patient understands the plans discussed today and is in agreement with them.  He knows to contact our office if he develops concerns prior to his next appointment.  I provided 25 minutes of face-to-face time during this encounter and > 50% was spent counseling as documented under my assessment and  plan.    Malachy Mood, MD  [facility] - A DEPT OF [facility] [address] [place] [address] Dept: [phone] Dept Fax: [phone]   Orders Placed This Encounter  Procedures   Guardant 360    Standing Status:   Future    Standing Expiration Date:   10/21/2024      CHIEF COMPLAINT:  CC: Pancreatic adenocarcinoma.  Current Treatment: Liposomal irinotecan and 5-FU pump infusion every 2 weeks  INTERVAL HISTORY:  Zachary Reid is here today for repeat clinical assessment.  Last saw me on 10/01/2023.  Was having significant diarrhea after every treatment.  This was accompanied with gas, bloating, and intestinal cramping.  Gradually getting worse since changed to liposomal irinotecan and 5-FU pump.  Creon was added 3 times daily with meals to help with digestion and improve diarrhea.  Unable to afford this. Diarrhea continues to be the biggest negative effect. He denies fevers or chills. He denies pain. His appetite is good. States that he is hungry. Having a hard time eating because diarrhea is occurring every time he eats.  His weight has increased 5 pounds over last 2 weeks .  I have reviewed the past medical history, past surgical history, social history and family history with the patient and they are unchanged from previous note.  ALLERGIES:  is allergic to lipitor [atorvastatin].  MEDICATIONS:  Current Outpatient Medications  Medication Sig Dispense Refill   baclofen (LIORESAL) 10 MG tablet Take 1 tablet (10 mg total) by mouth 3 (three) times daily as needed for muscle spasms. 30 each 0   Cholecalciferol (VITAMIN D3) 125 MCG (5000 UT) CAPS Take 5,000 Units by mouth in the morning and at bedtime.     Continuous Glucose Sensor (DEXCOM G7 SENSOR) MISC 1 Device by Does not apply route as directed. Change sensor every 10 days 3 each 3   diphenoxylate-atropine (LOMOTIL) 2.5-0.025 MG tablet Take 1-2 tablets by mouth 4 (four) times daily as needed for diarrhea or loose stools. 30 tablet 0   glucose blood test strip Use as instructed 100 each 12   hyoscyamine  (LEVSIN SL) 0.125 MG SL tablet DISSOLVE ONE TABLET UNDER THE TONGUE 4 TIMES DAILY UP TO EVERY 4 HOURS AS NEEDED FOR NAUSEA, BLOATING, CRAMPING, OR DIARRHEA 100 tablet 0   insulin degludec (TRESIBA FLEXTOUCH) 100 UNIT/ML FlexTouch Pen Inject 32 Units into the skin daily. 15 mL 1   insulin lispro (HUMALOG KWIKPEN) 100 UNIT/ML KwikPen 10 TO 15 UNITS BEFORE MEALS AS DIRECTED 15 mL 0   lipase/protease/amylase (CREON) 36000 UNITS CPEP capsule Take 1 capsule (36,000 Units total) by mouth 3 (three) times daily with meals. May also take 1 capsule (36,000 Units total) as needed (with snacks - up to 4 snacks daily). 150 capsule 11   metoprolol succinate (TOPROL XL) 25 MG 24 hr tablet Take 0.5 tablets (12.5 mg total) by mouth daily. 45 tablet 3   mirtazapine (REMERON) 7.5 MG tablet Take 1 tablet (7.5 mg total) by mouth at bedtime. 90 tablet 1   oxyCODONE (ROXICODONE) 5 MG immediate release tablet Take 1 tablet (5 mg total) by mouth every 6 (six) hours as needed for severe pain. 30 tablet 0   rivaroxaban (XARELTO) 20 MG TABS tablet Take 1 tablet (20 mg total) by mouth daily with supper. 90 tablet 3   silver sulfADIAZINE (SILVADENE) 1 % cream Apply 1 Application topically daily. 20 g 0   zolpidem (AMBIEN)  10 MG tablet Take 1 tablet (10 mg total) by mouth at bedtime as needed. for sleep 60 tablet 0   ALPRAZolam (XANAX) 0.5 MG tablet Take 1 tablet (0.5 mg total) by mouth 3 (three) times daily as needed for anxiety. 60 tablet 1   gabapentin (NEURONTIN) 600 MG tablet Take 1 tablet (600 mg total) by mouth 3 (three) times daily as needed (pain). 90 tablet 0   ondansetron (ZOFRAN) 8 MG tablet Take 1 tablet (8 mg total) by mouth every 8 (eight) hours as needed for nausea or vomiting. 60 tablet 1   prochlorperazine (COMPAZINE) 10 MG tablet Take 1 tablet (10 mg total) by mouth every 6 (six) hours as needed (Nausea or vomiting). 60 tablet 1   traMADol (ULTRAM) 50 MG tablet Take 1-2 tablets (50-100 mg total) by mouth every 6  (six) hours as needed. 120 tablet 0   No current facility-administered medications for this visit.    HISTORY OF PRESENT ILLNESS:   Oncology History Overview Note   Cancer Staging  Pancreatic adenocarcinoma [facility]) Staging form: Exocrine Pancreas, AJCC 8th Edition - Clinical stage from 07/26/2021: Stage IV (cT4, cN0, cM1) - Signed by Malachy Mood, MD on 07/28/2021    Pancreatic adenocarcinoma (HCC)  07/02/2021 Initial Diagnosis   Pancreatic adenocarcinoma (HCC)   07/26/2021 Cancer Staging   Staging form: Exocrine Pancreas, AJCC 8th Edition - Clinical stage from 07/26/2021: Stage IV (cT4, cN0, cM1) - Signed by Malachy Mood, MD on 07/28/2021 Stage prefix: Initial diagnosis Total positive nodes: 0   08/13/2021 - 07/06/2022 Chemotherapy   Patient is on Treatment Plan : PANCREAS Modified FOLFIRINOX q14d x 4 cycles      Genetic Testing   [facility] CancerNext-Expanded results (77 genes) were negative. No pathogenic variants were identified. 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 The report date is 08/15/2021.    The CancerNext-Expanded gene panel offered by [facility] and includes sequencing, rearrangement, and RNA analysis for the following 77 genes: AIP, ALK, APC, ATM, AXIN2, BAP1, BARD1, BLM, BMPR1A, BRCA1, BRCA2, BRIP1, CDC73, CDH1, CDK4, CDKN1B, CDKN2A, CHEK2, CTNNA1, DICER1, FANCC, FH, FLCN, GALNT12, KIF1B, LZTR1, MAX, MEN1, MET, MLH1, MSH2, MSH3, MSH6, MUTYH, NBN, NF1, NF2, NTHL1, PALB2, PHOX2B, PMS2, POT1, PRKAR1A, PTCH1, PTEN, RAD51C, RAD51D, RB1, RECQL, RET, SDHA, SDHAF2, SDHB, SDHC, SDHD, SMAD4, SMARCA4, SMARCB1, SMARCE1, STK11, SUFU, TMEM127, TP53, TSC1, TSC2, VHL and XRCC2 (sequencing and deletion/duplication); EGFR, EGLN1, HOXB13, KIT, MITF, PDGFRA, POLD1, and POLE (sequencing only); EPCAM and GREM1 (deletion/duplication only).     08/13/2021 - 12/06/2022 Chemotherapy   Patient is on Treatment Plan : PANCREAS Modified FOLFIRINOX q14d x 4 cycles     10/25/2021  Imaging   EXAM: CT CHEST, ABDOMEN, AND PELVIS WITH CONTRAST  IMPRESSION: 1. Innumerable bilateral pulmonary nodules, many of which are cavitary, consistent with metastatic disease. These nodules show no substantial change in are minimally progressed in the interval. 2. Mix cystic and solid lesion in the head and body of the pancreas is similar to prior and also comparing back to MRI 07/02/2021. 3. Hepatic cysts. 4. 8 mm nonobstructing left renal stone. 5. Aortic Atherosclerosis (ICD10-I70.0).   01/28/2022 Imaging   EXAM: CT CHEST, ABDOMEN, AND PELVIS WITH CONTRAST  IMPRESSION: 1. Innumerable bilateral small solid and cavitary pulmonary nodules, some of the cavitary nodules are slightly less thick-walled when compared with prior exam. 2. Mixed cystic and solid lesion in the head and body of the pancreas is similar to prior exam. 3. Nonobstructing left renal  stone. 4.  Aortic Atherosclerosis (ICD10-I70.0).   11/29/2022 Imaging    IMPRESSION: 1. No significant change in primary pancreatic mass and adjacent cystic component. Unchanged appearance of soft tissue extending to involve the adjacent celiac axis, superior mesenteric artery origin, and portal confluence. 2. 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 3. Innumerable small pulmonary nodules throughout the lungs, the majority of which are cavitary. Although interval change is difficult to appreciate for the majority of these nodules due to small size, at least some of these are slightly enlarged, consistent with worsened pulmonary metastatic disease. 4. Newly enlarged left retroperitoneal lymph nodes, which may reflect nodal metastatic disease or perhaps reactive to left hydronephrosis. Attention on follow-up. 5. A sizable calculus previously seen in the left renal pelvis has migrated to the middle third of the left ureter, with placement of  a double-J left ureteral stent, with formed pigtails in the left renal pelvis and urinary bladder. Moderate left hydronephrosis and proximal hydroureter. 6. Coronary artery disease.   Aortic Atherosclerosis (ICD10-I70.0).   01/02/2023 - 06/26/2023 Chemotherapy   Patient is on Treatment Plan : PANCREATIC Abraxane D1,8,15 + Gemcitabine D1,8,15 q28d     03/12/2023 Imaging    IMPRESSION: 1. Primary pancreatic mass with adjacent cystic component is slightly increased in size when compared with the prior exam. Similar soft tissue extending involve the celiac axis superior mesenteric artery origin and portal confluence. 2. Innumerable bilateral pulmonary nodules, some of which are cavitary, unchanged when compared with the prior. 3. Left retroperitoneal lymph nodes are decreased in size, likely resolving reactive adenopathy 4. Interval removal of left ureter stent.  No hydronephrosis. 5. coronary artery disease and aortic Atherosclerosis (ICD10-I70.0).   07/22/2023 Imaging   CT CAP W CONTRAST   IMPRESSION: 1. Today's study demonstrates progression of disease as evidenced by enlargement of the primary pancreatic mass, mild enlargement of numerous prominent borderline enlarged and mildly enlarged upper abdominal and retroperitoneal lymph nodes, and increased number and size of numerous metastatic nodules scattered throughout the lungs bilaterally, as detailed above. 2. 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 Main portal vein remains patent at this time. 3. 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 4. Nonobstructive nephrolithiasis in the kidneys bilaterally measuring 2-4 mm in size. 5. Aortic atherosclerosis, in addition to left main and three-vessel coronary artery disease. 6. Additional incidental findings, as above.   08/07/2023 -  Chemotherapy    Patient is on Treatment Plan : PANCREAS Liposomal Irinotecan + Leucovorin + 5-FU IVCI q14d         REVIEW OF SYSTEMS:   Constitutional: Denies fevers, chills or abnormal weight loss. Moderate fatigue Eyes: Denies blurriness of vision Ears, nose, mouth, throat, and face: Denies mucositis or sore throat Respiratory: Denies cough, dyspnea or wheezes Cardiovascular: Denies palpitation, chest discomfort or lower extremity swelling Gastrointestinal:  Denies nausea, heartburn or change in bowel habits. Diarrhea with eating Skin: Denies abnormal skin rashes Lymphatics: Denies new lymphadenopathy or easy bruising Neurological:Denies numbness, tingling or new weaknesses Behavioral/Psych: Mood is stable, no new changes  All other systems were reviewed with the patient and are negative.   VITALS:   Today's Vitals   10/22/23 0917  BP: 114/67  Pulse: 83  Resp: 17  Temp: (!) 97.5 F (36.4 C)  TempSrc: Temporal  SpO2: 97%  Weight: 196 lb 9.6 oz (89.2 kg)  PainSc:  0-No pain   Body mass index is 27.42 kg/m.   Wt Readings from Last 3 Encounters:  10/22/23 196 lb 9.6 oz (89.2 kg)  10/01/23 191 lb 14.4 oz (87 kg)  09/17/23 195 lb 1.6 oz (88.5 kg)    Body mass index is 27.42 kg/m.  Performance status (ECOG): 2 - Symptomatic, <50% confined to bed  PHYSICAL EXAM:   GENERAL:alert, no distress and comfortable SKIN: skin color, texture, turgor are normal, no rashes or significant lesions EYES: normal, Conjunctiva are pink and non-injected, sclera clear OROPHARYNX:no exudate, no erythema and lips, buccal mucosa, and tongue normal  NECK: supple, thyroid normal size, non-tender, without nodularity LYMPH:  no palpable lymphadenopathy in the cervical, axillary or inguinal LUNGS: clear to auscultation and percussion with normal breathing effort HEART: regular rate & rhythm and no murmurs and no lower extremity edema ABDOMEN:abdomen soft, non-tender and normal bowel sounds Musculoskeletal:no  cyanosis of digits and no clubbing  NEURO: alert & oriented x 3 with fluent speech, no focal motor/sensory deficits  LABORATORY DATA:  I have reviewed the data as listed    Component Value Date/Time   NA 140 10/22/2023 0843   K 3.4 (L) 10/22/2023 0843   CL 108 10/22/2023 0843   CO2 26 10/22/2023 0843   GLUCOSE 214 (H) 10/22/2023 0843   BUN 13 10/22/2023 0843   CREATININE 0.83 10/22/2023 0843   CREATININE 0.88 06/05/2021 1130   CALCIUM 9.1 10/22/2023 0843   PROT 6.4 (L) 10/22/2023 0843   ALBUMIN 3.9 10/22/2023 0843   AST 22 10/22/2023 0843   ALT 24 10/22/2023 0843   ALKPHOS 120 10/22/2023 0843   BILITOT 0.6 10/22/2023 0843   GFRNONAA >60 10/22/2023 0843   GFRNONAA 96 03/05/2021 0915   GFRAA 112 03/05/2021 0915     Lab Results  Component Value Date   WBC 5.1 10/22/2023   NEUTROABS 3.2 10/22/2023   HGB 12.5 (L) 10/22/2023   HCT 36.3 (L) 10/22/2023   MCV 99.5 10/22/2023   PLT 120 (L) 10/22/2023   Addendum I have seen the patient, examined him. I agree with the assessment and and plan and have edited the notes.   Corneilius is clinically stable, tolerating chemotherapy moderately well with fatigue.  No other new symptoms.  I personally reviewed his restaging CT scan images from October 20, 2023, which showed worsening pulmonary metastasis, will call him when the scan report returns.  His tumor marker has been increasing, which also indicating disease progression.  I discussed the treatment options, which is very limited, and recommend dose reduced to FOLFOX as of next line therapy.  I recommend liquid biopsy Guardant360 to see if he has new targetable mutations.  Patient agrees to proceed.  Due to the upcoming holiday, he would like to start new chemotherapy after new year.  All questions were answered.  I spent a total of 40 minutes for his visit, more than 50% time on face-to-face counseling.  Malachy Mood 10/22/2023

## 2023-10-20 ENCOUNTER — Ambulatory Visit (HOSPITAL_COMMUNITY)
Admission: RE | Admit: 2023-10-20 | Discharge: 2023-10-20 | Disposition: A | Discharge status: Home / Self Care | Payer: Medicare Other | Source: Ambulatory Visit | Attending: Hematology | Admitting: Hematology

## 2023-10-20 DIAGNOSIS — C259 Malignant neoplasm of pancreas, unspecified: Secondary | ICD-10-CM | POA: Diagnosis present

## 2023-10-20 MED ORDER — HEPARIN SOD (PORK) LOCK FLUSH 100 UNIT/ML IV SOLN
INTRAVENOUS | Status: AC
  Filled 2023-10-20: qty 5

## 2023-10-20 MED ORDER — HEPARIN SOD (PORK) LOCK FLUSH 100 UNIT/ML IV SOLN
500.0000 [IU] | Freq: Once | INTRAVENOUS | Status: AC
  Administered 2023-10-20: 500 [IU] via INTRAVENOUS

## 2023-10-20 MED ORDER — SODIUM CHLORIDE (PF) 0.9 % IJ SOLN
INTRAMUSCULAR | Status: AC
  Filled 2023-10-20: qty 50

## 2023-10-20 MED ORDER — IOHEXOL 300 MG/ML  SOLN
100.0000 mL | Freq: Once | INTRAMUSCULAR | Status: AC | PRN
  Administered 2023-10-20: 100 mL via INTRAVENOUS

## 2023-10-21 MED FILL — Fosaprepitant Dimeglumine For IV Infusion 150 MG (Base Eq): INTRAVENOUS | Qty: 5 | Status: AC

## 2023-10-22 ENCOUNTER — Encounter: Payer: Self-pay | Admitting: Nurse Practitioner

## 2023-10-22 ENCOUNTER — Inpatient Hospital Stay: Payer: Medicare Other | Attending: Hematology

## 2023-10-22 ENCOUNTER — Inpatient Hospital Stay: Payer: Medicare Other

## 2023-10-22 ENCOUNTER — Inpatient Hospital Stay (HOSPITAL_BASED_OUTPATIENT_CLINIC_OR_DEPARTMENT_OTHER): Payer: Medicare Other | Admitting: Nurse Practitioner

## 2023-10-22 ENCOUNTER — Encounter: Payer: Self-pay | Admitting: Hematology

## 2023-10-22 VITALS — BP 114/67 | HR 83 | Temp 97.5°F | Resp 17 | Wt 196.6 lb

## 2023-10-22 DIAGNOSIS — Z794 Long term (current) use of insulin: Secondary | ICD-10-CM | POA: Diagnosis not present

## 2023-10-22 DIAGNOSIS — Z5111 Encounter for antineoplastic chemotherapy: Secondary | ICD-10-CM | POA: Insufficient documentation

## 2023-10-22 DIAGNOSIS — G893 Neoplasm related pain (acute) (chronic): Secondary | ICD-10-CM | POA: Diagnosis not present

## 2023-10-22 DIAGNOSIS — Z9221 Personal history of antineoplastic chemotherapy: Secondary | ICD-10-CM | POA: Insufficient documentation

## 2023-10-22 DIAGNOSIS — Z79899 Other long term (current) drug therapy: Secondary | ICD-10-CM | POA: Insufficient documentation

## 2023-10-22 DIAGNOSIS — K7689 Other specified diseases of liver: Secondary | ICD-10-CM | POA: Diagnosis not present

## 2023-10-22 DIAGNOSIS — M792 Neuralgia and neuritis, unspecified: Secondary | ICD-10-CM

## 2023-10-22 DIAGNOSIS — I700 Atherosclerosis of aorta: Secondary | ICD-10-CM | POA: Diagnosis not present

## 2023-10-22 DIAGNOSIS — N132 Hydronephrosis with renal and ureteral calculous obstruction: Secondary | ICD-10-CM | POA: Diagnosis not present

## 2023-10-22 DIAGNOSIS — Z515 Encounter for palliative care: Secondary | ICD-10-CM | POA: Diagnosis not present

## 2023-10-22 DIAGNOSIS — I2510 Atherosclerotic heart disease of native coronary artery without angina pectoris: Secondary | ICD-10-CM | POA: Insufficient documentation

## 2023-10-22 DIAGNOSIS — Z7901 Long term (current) use of anticoagulants: Secondary | ICD-10-CM | POA: Diagnosis not present

## 2023-10-22 DIAGNOSIS — R197 Diarrhea, unspecified: Secondary | ICD-10-CM | POA: Diagnosis not present

## 2023-10-22 DIAGNOSIS — F419 Anxiety disorder, unspecified: Secondary | ICD-10-CM

## 2023-10-22 DIAGNOSIS — R110 Nausea: Secondary | ICD-10-CM

## 2023-10-22 DIAGNOSIS — C259 Malignant neoplasm of pancreas, unspecified: Secondary | ICD-10-CM

## 2023-10-22 DIAGNOSIS — Z95828 Presence of other vascular implants and grafts: Secondary | ICD-10-CM

## 2023-10-22 DIAGNOSIS — R5383 Other fatigue: Secondary | ICD-10-CM | POA: Insufficient documentation

## 2023-10-22 DIAGNOSIS — I8500 Esophageal varices without bleeding: Secondary | ICD-10-CM | POA: Diagnosis not present

## 2023-10-22 DIAGNOSIS — E860 Dehydration: Secondary | ICD-10-CM

## 2023-10-22 DIAGNOSIS — Z87442 Personal history of urinary calculi: Secondary | ICD-10-CM | POA: Diagnosis not present

## 2023-10-22 DIAGNOSIS — R140 Abdominal distension (gaseous): Secondary | ICD-10-CM | POA: Insufficient documentation

## 2023-10-22 LAB — CMP (CANCER CENTER ONLY)
ALT: 24 U/L (ref 0–44)
AST: 22 U/L (ref 15–41)
Albumin: 3.9 g/dL (ref 3.5–5.0)
Alkaline Phosphatase: 120 U/L (ref 38–126)
Anion gap: 6 (ref 5–15)
BUN: 13 mg/dL (ref 8–23)
CO2: 26 mmol/L (ref 22–32)
Calcium: 9.1 mg/dL (ref 8.9–10.3)
Chloride: 108 mmol/L (ref 98–111)
Creatinine: 0.83 mg/dL (ref 0.61–1.24)
GFR, Estimated: >60 mL/min (ref >60)
Glucose, Bld: 214 mg/dL — ABNORMAL HIGH (ref 70–99)
Potassium: 3.4 mmol/L — ABNORMAL LOW (ref 3.5–5.1)
Sodium: 140 mmol/L (ref 135–145)
Total Bilirubin: 0.6 mg/dL (ref <1.2)
Total Protein: 6.4 g/dL — ABNORMAL LOW (ref 6.5–8.1)

## 2023-10-22 LAB — CBC WITH DIFFERENTIAL (CANCER CENTER ONLY)
Abs Immature Granulocytes: 0.01 10*3/uL (ref 0.00–0.07)
Basophils Absolute: 0 10*3/uL (ref 0.0–0.1)
Basophils Relative: 1 %
Eosinophils Absolute: 0.4 10*3/uL (ref 0.0–0.5)
Eosinophils Relative: 7 %
HCT: 36.3 % — ABNORMAL LOW (ref 39.0–52.0)
Hemoglobin: 12.5 g/dL — ABNORMAL LOW (ref 13.0–17.0)
Immature Granulocytes: 0 %
Lymphocytes Relative: 17 %
Lymphs Abs: 0.9 10*3/uL (ref 0.7–4.0)
MCH: 34.2 pg — ABNORMAL HIGH (ref 26.0–34.0)
MCHC: 34.4 g/dL (ref 30.0–36.0)
MCV: 99.5 fL (ref 80.0–100.0)
Monocytes Absolute: 0.7 10*3/uL (ref 0.1–1.0)
Monocytes Relative: 13 %
Neutro Abs: 3.2 10*3/uL (ref 1.7–7.7)
Neutrophils Relative %: 62 %
Platelet Count: 120 10*3/uL — ABNORMAL LOW (ref 150–400)
RBC: 3.65 MIL/uL — ABNORMAL LOW (ref 4.22–5.81)
RDW: 15 % (ref 11.5–15.5)
WBC Count: 5.1 10*3/uL (ref 4.0–10.5)
nRBC: 0 % (ref 0.0–0.2)

## 2023-10-22 MED ORDER — TRAMADOL HCL 50 MG PO TABS: 50.0000 mg | ORAL_TABLET | Freq: Four times a day (QID) | ORAL | 0 refills | Status: DC | PRN

## 2023-10-22 MED ORDER — SODIUM CHLORIDE 0.9 % IV SOLN
400.0000 mg/m2 | Freq: Once | INTRAVENOUS | Status: AC
  Administered 2023-10-22: 852 mg via INTRAVENOUS
  Filled 2023-10-22: qty 42.6

## 2023-10-22 MED ORDER — SODIUM CHLORIDE 0.9 % IV SOLN
150.0000 mg | Freq: Once | INTRAVENOUS | Status: AC
Start: 2023-10-22 — End: 2023-10-22
  Administered 2023-10-22: 150 mg via INTRAVENOUS
  Filled 2023-10-22: qty 150

## 2023-10-22 MED ORDER — SODIUM CHLORIDE 0.9% FLUSH
10.0000 mL | Freq: Once | INTRAVENOUS | Status: AC
Start: 2023-10-22 — End: 2023-10-22
  Administered 2023-10-22: 10 mL

## 2023-10-22 MED ORDER — SODIUM CHLORIDE 0.9 % IV SOLN: Freq: Once | INTRAVENOUS | Status: AC

## 2023-10-22 MED ORDER — GABAPENTIN 600 MG PO TABS: 600.0000 mg | ORAL_TABLET | Freq: Three times a day (TID) | ORAL | 0 refills | Status: DC | PRN

## 2023-10-22 MED ORDER — ATROPINE SULFATE 1 MG/ML IV SOLN
0.5000 mg | Freq: Once | INTRAVENOUS | Status: AC | PRN
  Administered 2023-10-22: 0.5 mg via INTRAVENOUS
  Filled 2023-10-22: qty 1

## 2023-10-22 MED ORDER — ALPRAZOLAM 0.5 MG PO TABS: 0.5000 mg | ORAL_TABLET | Freq: Three times a day (TID) | ORAL | 1 refills | Status: DC | PRN

## 2023-10-22 MED ORDER — SODIUM CHLORIDE 0.9 % IV SOLN
3100.0000 mg | INTRAVENOUS | Status: DC
  Administered 2023-10-22: 3100 mg via INTRAVENOUS
  Filled 2023-10-22: qty 62

## 2023-10-22 MED ORDER — DEXAMETHASONE SODIUM PHOSPHATE 10 MG/ML IJ SOLN
10.0000 mg | Freq: Once | INTRAMUSCULAR | Status: AC
  Administered 2023-10-22: 10 mg via INTRAVENOUS
  Filled 2023-10-22: qty 1

## 2023-10-22 MED ORDER — PROCHLORPERAZINE MALEATE 10 MG PO TABS: 10.0000 mg | ORAL_TABLET | Freq: Four times a day (QID) | ORAL | 1 refills | Status: DC | PRN

## 2023-10-22 MED ORDER — ONDANSETRON HCL 8 MG PO TABS: 8.0000 mg | ORAL_TABLET | Freq: Three times a day (TID) | ORAL | 1 refills | Status: DC | PRN

## 2023-10-22 MED ORDER — IRINOTECAN HCL LIPOSOME CHEMO INJECTION 43 MG/10ML
40.0000 mg/m2 | INJECTION | Freq: Once | INTRAVENOUS | Status: AC
  Administered 2023-10-22: 86 mg via INTRAVENOUS
  Filled 2023-10-22: qty 20

## 2023-10-22 MED ORDER — PALONOSETRON HCL INJECTION 0.25 MG/5ML
0.2500 mg | Freq: Once | INTRAVENOUS | Status: AC
  Administered 2023-10-22: 0.25 mg via INTRAVENOUS
  Filled 2023-10-22: qty 5

## 2023-10-22 NOTE — Patient Instructions (Signed)
[facility]  Discharge Instructions: Thank you for choosing [facility] to provide your oncology and hematology care.   If you have a lab appointment with the Cancer Center, please go directly to the Cancer Center and check in at the registration area.   Wear comfortable clothing and clothing appropriate for easy access to any Portacath or PICC line.   We strive to give you quality time with your provider. You may need to reschedule your appointment if you arrive late (15 or more minutes).  Arriving late affects you and other patients whose appointments are after yours.  Also, if you miss three or more appointments without notifying the office, you may be dismissed from the clinic at the provider's discretion.      For prescription refill requests, have your pharmacy contact our office and allow 72 hours for refills to be completed.    Today you received the following chemotherapy and/or immunotherapy agents Irinotecan, Leucovorin, fluorouracil.       To help prevent nausea and vomiting after your treatment, we encourage you to take your nausea medication as directed.  BELOW ARE SYMPTOMS THAT SHOULD BE REPORTED IMMEDIATELY: *FEVER GREATER THAN 100.4 F (38 C) OR HIGHER *CHILLS OR SWEATING *NAUSEA AND VOMITING THAT IS NOT CONTROLLED WITH YOUR NAUSEA MEDICATION *UNUSUAL SHORTNESS OF BREATH *UNUSUAL BRUISING OR BLEEDING *URINARY PROBLEMS (pain or burning when urinating, or frequent urination) *BOWEL PROBLEMS (unusual diarrhea, constipation, pain near the anus) TENDERNESS IN MOUTH AND THROAT WITH OR WITHOUT PRESENCE OF ULCERS (sore throat, sores in mouth, or a toothache) UNUSUAL RASH, SWELLING OR PAIN  UNUSUAL VAGINAL DISCHARGE OR ITCHING   Items with * indicate a potential emergency and should be followed up as soon as possible or go to the Emergency Department if any problems should occur.  Please show the CHEMOTHERAPY  ALERT CARD or IMMUNOTHERAPY ALERT CARD at check-in to the Emergency Department and triage nurse.  Should you have questions after your visit or need to cancel or reschedule your appointment, please contact [facility] - A DEPT OF [facility]  Dept: [phone]  and follow the prompts.  Office hours are 8:00 a.m. to 4:30 p.m. Monday - Friday. Please note that voicemails left after 4:00 p.m. may not be returned until the following business day.  We are closed weekends and major holidays. You have access to a nurse at all times for urgent questions. Please call the main number to the clinic Dept: [phone] and follow the prompts.   For any non-urgent questions, you may also contact your provider using MyChart. We now offer e-Visits for anyone 57 and older to request care online for non-urgent symptoms. For details visit [email or web address].   Also download the MyChart app! Go to the app store, search "MyChart", open the app, select , and log in with your MyChart username and password.

## 2023-10-23 ENCOUNTER — Encounter: Payer: Self-pay | Admitting: Hematology

## 2023-10-23 ENCOUNTER — Other Ambulatory Visit: Payer: Self-pay

## 2023-10-23 ENCOUNTER — Telehealth: Payer: Self-pay

## 2023-10-23 ENCOUNTER — Other Ambulatory Visit: Payer: Self-pay | Admitting: Endocrinology

## 2023-10-23 DIAGNOSIS — E1122 Type 2 diabetes mellitus with diabetic chronic kidney disease: Secondary | ICD-10-CM

## 2023-10-23 DIAGNOSIS — E1165 Type 2 diabetes mellitus with hyperglycemia: Secondary | ICD-10-CM

## 2023-10-23 NOTE — Telephone Encounter (Signed)
I will. Will he start the first week of January?

## 2023-10-23 NOTE — Telephone Encounter (Signed)
Pt called stating he would like to speak with Dr. Mosetta Putt regarding his recent CT Scan results.  Pt stated when he was in clinic with Dr. Mosetta Putt last the results had not finalized but has now done so.  Pt stated he's able to see the results and would like to discuss the results and the plan based on the results.  Pt is requesting if Dr. Mosetta Putt would please give him a call.  Notified Dr. Mosetta Putt of the pt's request.

## 2023-10-23 NOTE — Telephone Encounter (Signed)
I spoke to the patient and his wife. They would like to start on January 2nd and do treatment every 2 weeks. He understands this is very similar to the FOLFIRINOX he was taking before, but without the irinotecan. He verbalized good understanding of the CT results and the reasons for changing the treatment. He doesn't want to wait until 2nd week of January, but doesn't want to start before christmas. I can tell Erie Noe to get him set up with appointments for labs/flush, follow up, and treatment if this is ok.  Herbert Seta

## 2023-10-24 ENCOUNTER — Encounter: Payer: Self-pay | Admitting: Hematology

## 2023-10-24 ENCOUNTER — Inpatient Hospital Stay: Payer: Medicare Other

## 2023-10-24 DIAGNOSIS — C259 Malignant neoplasm of pancreas, unspecified: Secondary | ICD-10-CM

## 2023-10-24 DIAGNOSIS — Z5111 Encounter for antineoplastic chemotherapy: Secondary | ICD-10-CM | POA: Diagnosis not present

## 2023-10-24 MED ORDER — HEPARIN SOD (PORK) LOCK FLUSH 100 UNIT/ML IV SOLN
500.0000 [IU] | Freq: Once | INTRAVENOUS | Status: AC | PRN
  Administered 2023-10-24: 500 [IU]

## 2023-10-24 MED ORDER — SODIUM CHLORIDE 0.9% FLUSH
10.0000 mL | INTRAVENOUS | Status: DC | PRN
  Administered 2023-10-24: 10 mL

## 2023-10-24 NOTE — Addendum Note (Signed)
Addended by: Malachy Mood on: 10/24/2023 12:48 PM   Modules accepted: Orders

## 2023-10-24 NOTE — Telephone Encounter (Signed)
I'm not sure this message came to you. Can we get his appointments set up? Next week, after his pump is d/c tomorrow. Please and thank you.  Herbert Seta, NP

## 2023-10-24 NOTE — Progress Notes (Signed)
DISCONTINUE ON PATHWAY REGIMEN - Pancreatic Adenocarcinoma     A cycle is every 14 days:     Liposomal irinotecan      Leucovorin      Fluorouracil   **Always confirm dose/schedule in your pharmacy ordering system**  PRIOR TREATMENT: [id number]: Liposomal Irinotecan 70 mg/m2 + Fluorouracil 2,400 mg/m2 CIV + Leucovorin 400 mg/m2 q14 Days  START ON PATHWAY REGIMEN - Pancreatic Adenocarcinoma     A cycle is every 14 days:     Oxaliplatin      Leucovorin      Fluorouracil      Fluorouracil   **Always confirm dose/schedule in your pharmacy ordering system**  Patient Characteristics: Metastatic Disease, Third Line and Beyond, MSS/pMMR or MSI Unknown Therapeutic Status: Metastatic Disease Line of Therapy: [employer] manager Status: MSS/pMMR Intent of Therapy: Non-Curative / Palliative Intent, Discussed with Patient

## 2023-10-25 ENCOUNTER — Other Ambulatory Visit: Payer: Self-pay | Admitting: Cardiology

## 2023-11-03 ENCOUNTER — Encounter: Payer: Self-pay | Admitting: Hematology

## 2023-11-07 LAB — GUARDANT 360

## 2023-11-18 ENCOUNTER — Ambulatory Visit: Payer: Medicare Other | Admitting: Nurse Practitioner

## 2023-11-18 ENCOUNTER — Other Ambulatory Visit: Payer: Medicare Other

## 2023-11-18 ENCOUNTER — Ambulatory Visit: Payer: Medicare Other

## 2023-11-18 NOTE — Progress Notes (Signed)
 Pharmacist Chemotherapy Monitoring - Initial Assessment    Anticipated start date: 11/26/23  The following has been reviewed per standard work regarding the patient's treatment regimen: The patient's diagnosis, treatment plan and drug doses, and organ/hematologic function Lab orders and baseline tests specific to treatment regimen  The treatment plan start date, drug sequencing, and pre-medications Prior authorization status  Patient's documented medication list, including drug-drug interaction screen and prescriptions for anti-emetics and supportive care specific to the treatment regimen The drug concentrations, fluid compatibility, administration routes, and timing of the medications to be used The patient's access for treatment and lifetime cumulative dose history, if applicable  The patient's medication allergies and previous infusion related reactions, if applicable   Changes made to treatment plan:  treatment plan date and pre-medications    Follow up needed:  N/A   Corean Sever, PharmD PGY2 Oncology Pharmacy Resident   11/18/2023 1:56 PM

## 2023-11-24 NOTE — Assessment & Plan Note (Signed)
 [id number] with numerous small cavitary lesions in bilateral lungs, MMR normal  -diagnosed 07/2021, after incidental finding on CT scan for kidney stone, by colonoscopy/EUS. -Baseline CA 19-9 elevated to 273 on 07/03/21 -He began first-line mFOLFIRINOX on 08/13/21, but required dose reduction and move to every 3 weeks. -his CA 19-9 trended up but is now stable. -restaging CT CAP 08/12/22 showed: multiple new and enlarging pulmonary metastases; pancreatic mass grossly stable. Will repeat in 3 months. -we changed his irinotecan to liposomal on 09/05/22. He tolerated better and feels stronger. -Due to disease progression, his treatment was changed to gemcitabine and Abraxane every 2 weeks on 01/02/2023 -He tolerated the first two cycles chemotherapy very well with mild fatigue.  I increased gemcitabine to 1000mg /m2 from cycle 3 -Restaging CT scan 03/12/2023 showed stable disease overall. His primary pancreatic tumor is slightly increased in size, however his multiple cavitary lung nodules are slightly improved with thinner walls now.   -will continue current therapy.  He is overall tolerating well, but he has developed worsening fatigue lately.  I will reduce gemcitabine dose to 800 mg/m again. -His tumor marker CA 19.9 continues trending down, which is a good clinical sign. -restaging CT 07/22/2023 showed moderate disease progression at the primary site and lung mets, I personally reviewed the scan images with patient and his wife in detail. -I changed his chemo to liposomal irinotecan and 5-FU pump infusion every 2 weeks on 08/07/2023 with dose reduction due to fatigue  -restaging CT 10/22/23 showed progression in lungs  -will change chemo to dose reduced FOLFOX starting 11/26/23  -He understands the goal of chemotherapy is palliative, to prolong his life. -- Improved side effects with reduced dose FOLFOX.

## 2023-11-25 NOTE — Progress Notes (Signed)
 Palliative Medicine [facility]  [phone] [phone]   Name: Zachary Reid Date: 11/25/2023 MRN: [id number]  DOB: 09/06/1956  Patient Care Team: Tonita Fallow, MD as PCP - General (Internal Medicine) Burton, Lacie K, NP as PCP - Hematology/Oncology (Nurse Practitioner) Sheena Pugh, DO as PCP - Cardiology (Cardiology) Harden Jerona GAILS, MD as Consulting Physician (Orthopedic Surgery) Rolan Ezra RAMAN, MD as Consulting Physician (Cardiology) Lanny Callander, MD as Consulting Physician (Oncology) Pickenpack-Cousar, Fannie SAILOR, NP as Nurse Practitioner (Hospice and Palliative Medicine) Pickenpack-Cousar, Fannie SAILOR, NP as Nurse Practitioner [facility] and Palliative Medicine)    INTERVAL HISTORY: Euclide Granito is a 68 y.o. male with oncologic medical history including pancreatic adenocarcinoma (06/2021), HLD, diabetes, CKD, HTN, and anxiety. Palliative ask to see for symptom management and goals of care.     SOCIAL HISTORY:     reports that he has never smoked. He has never used smokeless tobacco. He reports that he does not drink alcohol and does not use drugs.  ADVANCE DIRECTIVES:  Advanced directives on file naming wife, Marchello Rothgeb, as patient healthcare power of attorney, and daughter, Takai Chiaramonte as counsellor.   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 tobb;   newly dx ED 10-31-2022 w/ RVR  in setting flank pain (kidney stone)  --  office note in epic 11-07-2022 normal ETT 11-13-2022, echo 11-07-2022 ef 65-70% w/ mild LVH, mild MR,  zio monitor not completed,  started on toprol  and xarelto  daily   Personal history of chemotherapy    Receiving chemotherapy for metastatic pancreatic cancer. Most recent infusion (as of 12/02/22) was on 11/15/22.   Port-A-Cath in place 08/10/2021   Type 2 diabetes mellitus treated with insulin  [facility])    endocrinologist--- dr von   Vitamin D  deficiency    takes Vit D supplements   Wears contact lenses     ALLERGIES:  is allergic to lipitor [atorvastatin].  MEDICATIONS:  Current Outpatient Medications  Medication Sig Dispense Refill   ALPRAZolam  (XANAX ) 0.5 MG tablet Take 1 tablet (0.5 mg total) by mouth 3 (three) times daily as needed for anxiety. 60 tablet 1   baclofen  (LIORESAL ) 10 MG tablet Take 1 tablet (10 mg total) by mouth 3 (three) times daily as needed for muscle spasms. 30 each 0   Cholecalciferol (VITAMIN D3) 125 MCG (5000 UT) CAPS Take 5,000 Units by mouth in the morning and at bedtime.     Continuous Glucose Sensor (DEXCOM G7 SENSOR) MISC 1 Device by Does not apply route as directed. Change sensor every 10 days 3 each 3   diphenoxylate -atropine  (LOMOTIL ) 2.5-0.025 MG tablet Take 1-2 tablets by mouth 4 (four) times daily as needed for diarrhea or loose stools. 30 tablet 0   gabapentin  (NEURONTIN ) 600 MG tablet Take 1 tablet (600 mg total) by mouth 3 (three) times daily  as needed (pain). 90 tablet 0   glucose blood test strip Use as instructed 100 each 12   hyoscyamine  (LEVSIN  SL) 0.125 MG SL tablet DISSOLVE ONE TABLET UNDER THE TONGUE 4 TIMES DAILY UP TO EVERY 4 HOURS AS NEEDED FOR NAUSEA, BLOATING, CRAMPING, OR DIARRHEA 100 tablet 0   insulin  degludec (TRESIBA  FLEXTOUCH) 100 UNIT/ML FlexTouch Pen INJECT 32 UNITS INTO THE SKIN DAILY. 15 mL 4   insulin  lispro (HUMALOG  KWIKPEN) 100 UNIT/ML KwikPen 10 TO 15  UNITS BEFORE MEALS AS DIRECTED 15 mL 0   lipase/protease/amylase (CREON ) 36000 UNITS CPEP capsule Take 1 capsule (36,000 Units total) by mouth 3 (three) times daily with meals. May also take 1 capsule (36,000 Units total) as needed (with snacks - up to 4 snacks daily). 150 capsule 11   metoprolol  succinate (TOPROL -XL) 25 MG 24 hr tablet TAKE 1/2 TABLET BY MOUTH EVERY DAY 45 tablet 3   mirtazapine  (REMERON ) 7.5 MG tablet Take 1 tablet (7.5 mg total) by mouth at bedtime. 90 tablet 1   ondansetron  (ZOFRAN ) 8 MG tablet Take 1 tablet (8 mg total) by mouth every 8 (eight) hours as needed for nausea or vomiting. 60 tablet 1   oxyCODONE  (ROXICODONE ) 5 MG immediate release tablet Take 1 tablet (5 mg total) by mouth every 6 (six) hours as needed for severe pain. 30 tablet 0   prochlorperazine  (COMPAZINE ) 10 MG tablet Take 1 tablet (10 mg total) by mouth every 6 (six) hours as needed (Nausea or vomiting). 60 tablet 1   rivaroxaban  (XARELTO ) 20 MG TABS tablet Take 1 tablet (20 mg total) by mouth daily with supper. 90 tablet 3   silver  sulfADIAZINE  (SILVADENE ) 1 % cream Apply 1 Application topically daily. 20 g 0   traMADol  (ULTRAM ) 50 MG tablet Take 1-2 tablets (50-100 mg total) by mouth every 6 (six) hours as needed. 120 tablet 0   zolpidem  (AMBIEN ) 10 MG tablet Take 1 tablet (10 mg total) by mouth at bedtime as needed. for sleep 60 tablet 0   No current facility-administered medications for this visit.    VITAL SIGNS: There were no vitals taken for this visit. There were no vitals filed for this visit.  Estimated body mass index is 27.42 kg/m as calculated from the following:   Height as of 10/01/23: 5' 11 (1.803 m).   Weight as of 10/22/23: 196 lb 9.6 oz (89.2 kg).   PERFORMANCE STATUS (ECOG) : 1 - Symptomatic but completely ambulatory   Physical Exam General: NAD Cardiovascular: regular rate and rhythm Pulmonary: normal breathing pattern  Extremities: no edema, no joint deformities Skin: no  rashes Neurological: alert and oriented x3   IMPRESSION:  I saw Mr. Altamura during his infusion. Tolerating without difficulty. No acute distress. Denies nausea, vomiting. He reports experiencing gastrointestinal symptoms, which he describes as 'gremlins.' These symptoms include stomach gurgling, diarrhea, and constipation, which he manages with Tums, Docolax, and Miralax. Will take Immodium with diarrhea episodes.   Mr. Mcnamee presents with an increase in pain severity. He reports that his pain, located in the lower back and flank, has been worsening, with episodes of severe pain occurring more frequently. The patient describes waking up early in the morning with intense pain, which he rates as 'as bad as it could have been.' He has been managing his pain with tramadol  for moderate pain and oxycodone  for severe episodes. However, he has been needing to use oxycodone  more frequently due to the increased severity of his pain. The patient expresses  a desire to continue his current pain management regimen, using tramadol  as his primary medication and oxycodone  for severe pain episodes. We discussed use of both pain medications with understanding of safety and defined use base on his level of pain. He is hopeful that his pain will decrease allowing for Tramadol  use only in the upcoming weeks. We discussed increasing oxycodone  to every 4 hours 1-2 tablets as needed for severe pain again with emphasis on administration time in correlation to Tramadol  use.   The patient has been taking all medications appropriately as prescribed. Refills appropriate. Oxycodone  refills lasting 4-5 weeks.   All questions answered and support provided. We will continue closely monitoring pain. Discussed if oxycodone  use begins managing pain and need remains consistent may consider discontinuation of Tramadol .    Assessment and Plan  Chronic Cancer Related Pain Severe lower back pain, increasing in frequency and intensity.  Tramadol  is the primary medication, with Oxycodone  for severe episodes. Recent increase in Oxycodone  use due to worsening pain. -Increase Oxycodone  5mg  to 1-2 tablets every 4 hours as needed for severe pain. -Continue Tramadol  50-100 mg every 6-8 hours as needed for moderate pain. -Refill Oxycodone  and Tramadol  prescriptions.  Gastrointestinal Discomfort Reports of stomach gurgling and occasional diarrhea, managed with Tums and unspecified medication for diarrhea. -Continue current management with Tums and diarrhea medication. -Monitor symptoms and adjust treatment if condition changes.  Medication Management Education provided on use of pain medications related to moderate-severe pain, administration timeline, and safety including possible symptoms related to opioid use. -Communicate with pharmacy to ensure understanding of patient's pain management needs. -Encourage patient to contact provider if any issues arise with pharmacy.  Follow-up in a 1-2 of weeks to reassess pain management and gastrointestinal symptoms.  Patient expressed understanding and was in agreement with this plan. He also understands that He can call the clinic at any time with any questions, concerns, or complaints.   Any controlled substances utilized were prescribed in the context of palliative care. PDMP has been reviewed.    Visit consisted of counseling and education dealing with the complex and emotionally intense issues of symptom management and palliative care in the setting of serious and potentially life-threatening illness.  Levon Borer, AGPCNP-BC  Palliative Medicine Team/[facility]

## 2023-11-26 ENCOUNTER — Inpatient Hospital Stay: Payer: Medicare Other | Attending: Hematology

## 2023-11-26 ENCOUNTER — Inpatient Hospital Stay: Payer: Medicare Other

## 2023-11-26 ENCOUNTER — Encounter: Payer: Self-pay | Admitting: Nurse Practitioner

## 2023-11-26 ENCOUNTER — Inpatient Hospital Stay: Payer: Medicare Other | Admitting: Hematology

## 2023-11-26 ENCOUNTER — Encounter: Payer: Self-pay | Admitting: Hematology

## 2023-11-26 ENCOUNTER — Inpatient Hospital Stay (HOSPITAL_BASED_OUTPATIENT_CLINIC_OR_DEPARTMENT_OTHER): Payer: Medicare Other | Admitting: Nurse Practitioner

## 2023-11-26 VITALS — BP 122/86 | HR 93 | Temp 98.5°F | Resp 17 | Wt 193.4 lb

## 2023-11-26 DIAGNOSIS — E559 Vitamin D deficiency, unspecified: Secondary | ICD-10-CM | POA: Diagnosis not present

## 2023-11-26 DIAGNOSIS — M62838 Other muscle spasm: Secondary | ICD-10-CM

## 2023-11-26 DIAGNOSIS — G4700 Insomnia, unspecified: Secondary | ICD-10-CM | POA: Diagnosis not present

## 2023-11-26 DIAGNOSIS — Z95828 Presence of other vascular implants and grafts: Secondary | ICD-10-CM

## 2023-11-26 DIAGNOSIS — I480 Paroxysmal atrial fibrillation: Secondary | ICD-10-CM | POA: Diagnosis not present

## 2023-11-26 DIAGNOSIS — K5900 Constipation, unspecified: Secondary | ICD-10-CM

## 2023-11-26 DIAGNOSIS — Z7901 Long term (current) use of anticoagulants: Secondary | ICD-10-CM | POA: Diagnosis not present

## 2023-11-26 DIAGNOSIS — C259 Malignant neoplasm of pancreas, unspecified: Secondary | ICD-10-CM

## 2023-11-26 DIAGNOSIS — I82891 Chronic embolism and thrombosis of other specified veins: Secondary | ICD-10-CM | POA: Insufficient documentation

## 2023-11-26 DIAGNOSIS — N132 Hydronephrosis with renal and ureteral calculous obstruction: Secondary | ICD-10-CM | POA: Diagnosis not present

## 2023-11-26 DIAGNOSIS — E1122 Type 2 diabetes mellitus with diabetic chronic kidney disease: Secondary | ICD-10-CM | POA: Diagnosis not present

## 2023-11-26 DIAGNOSIS — Z9221 Personal history of antineoplastic chemotherapy: Secondary | ICD-10-CM | POA: Insufficient documentation

## 2023-11-26 DIAGNOSIS — Z87442 Personal history of urinary calculi: Secondary | ICD-10-CM | POA: Diagnosis not present

## 2023-11-26 DIAGNOSIS — R530 Neoplastic (malignant) related fatigue: Secondary | ICD-10-CM

## 2023-11-26 DIAGNOSIS — Z794 Long term (current) use of insulin: Secondary | ICD-10-CM | POA: Diagnosis not present

## 2023-11-26 DIAGNOSIS — I700 Atherosclerosis of aorta: Secondary | ICD-10-CM | POA: Insufficient documentation

## 2023-11-26 DIAGNOSIS — I2510 Atherosclerotic heart disease of native coronary artery without angina pectoris: Secondary | ICD-10-CM | POA: Diagnosis not present

## 2023-11-26 DIAGNOSIS — I8500 Esophageal varices without bleeding: Secondary | ICD-10-CM | POA: Insufficient documentation

## 2023-11-26 DIAGNOSIS — G893 Neoplasm related pain (acute) (chronic): Secondary | ICD-10-CM

## 2023-11-26 DIAGNOSIS — Z79899 Other long term (current) drug therapy: Secondary | ICD-10-CM | POA: Diagnosis not present

## 2023-11-26 DIAGNOSIS — R5383 Other fatigue: Secondary | ICD-10-CM | POA: Insufficient documentation

## 2023-11-26 DIAGNOSIS — E785 Hyperlipidemia, unspecified: Secondary | ICD-10-CM | POA: Insufficient documentation

## 2023-11-26 DIAGNOSIS — Z515 Encounter for palliative care: Secondary | ICD-10-CM

## 2023-11-26 DIAGNOSIS — M545 Low back pain, unspecified: Secondary | ICD-10-CM | POA: Diagnosis not present

## 2023-11-26 DIAGNOSIS — Z860101 Personal history of adenomatous and serrated colon polyps: Secondary | ICD-10-CM | POA: Insufficient documentation

## 2023-11-26 DIAGNOSIS — R059 Cough, unspecified: Secondary | ICD-10-CM | POA: Insufficient documentation

## 2023-11-26 DIAGNOSIS — Z5111 Encounter for antineoplastic chemotherapy: Secondary | ICD-10-CM | POA: Diagnosis present

## 2023-11-26 DIAGNOSIS — C7800 Secondary malignant neoplasm of unspecified lung: Secondary | ICD-10-CM | POA: Diagnosis not present

## 2023-11-26 DIAGNOSIS — K7689 Other specified diseases of liver: Secondary | ICD-10-CM | POA: Diagnosis not present

## 2023-11-26 DIAGNOSIS — E860 Dehydration: Secondary | ICD-10-CM

## 2023-11-26 LAB — CMP (CANCER CENTER ONLY)
ALT: 26 U/L (ref 0–44)
AST: 25 U/L (ref 15–41)
Albumin: 3.8 g/dL (ref 3.5–5.0)
Alkaline Phosphatase: 152 U/L — ABNORMAL HIGH (ref 38–126)
Anion gap: 6 (ref 5–15)
BUN: 15 mg/dL (ref 8–23)
CO2: 26 mmol/L (ref 22–32)
Calcium: 9 mg/dL (ref 8.9–10.3)
Chloride: 108 mmol/L (ref 98–111)
Creatinine: 0.76 mg/dL (ref 0.61–1.24)
GFR, Estimated: >60 mL/min (ref >60)
Glucose, Bld: 135 mg/dL — ABNORMAL HIGH (ref 70–99)
Potassium: 3.8 mmol/L (ref 3.5–5.1)
Sodium: 140 mmol/L (ref 135–145)
Total Bilirubin: 0.8 mg/dL (ref 0.0–1.2)
Total Protein: 6.6 g/dL (ref 6.5–8.1)

## 2023-11-26 LAB — CBC WITH DIFFERENTIAL (CANCER CENTER ONLY)
Abs Immature Granulocytes: 0.03 10*3/uL (ref 0.00–0.07)
Basophils Absolute: 0 10*3/uL (ref 0.0–0.1)
Basophils Relative: 0 %
Eosinophils Absolute: 0.3 10*3/uL (ref 0.0–0.5)
Eosinophils Relative: 3 %
HCT: 36 % — ABNORMAL LOW (ref 39.0–52.0)
Hemoglobin: 12.4 g/dL — ABNORMAL LOW (ref 13.0–17.0)
Immature Granulocytes: 0 %
Lymphocytes Relative: 12 %
Lymphs Abs: 0.9 10*3/uL (ref 0.7–4.0)
MCH: 33.7 pg (ref 26.0–34.0)
MCHC: 34.4 g/dL (ref 30.0–36.0)
MCV: 97.8 fL (ref 80.0–100.0)
Monocytes Absolute: 0.6 10*3/uL (ref 0.1–1.0)
Monocytes Relative: 8 %
Neutro Abs: 5.5 10*3/uL (ref 1.7–7.7)
Neutrophils Relative %: 77 %
Platelet Count: 96 10*3/uL — ABNORMAL LOW (ref 150–400)
RBC: 3.68 MIL/uL — ABNORMAL LOW (ref 4.22–5.81)
RDW: 12.4 % (ref 11.5–15.5)
WBC Count: 7.3 10*3/uL (ref 4.0–10.5)
nRBC: 0 % (ref 0.0–0.2)

## 2023-11-26 MED ORDER — DEXAMETHASONE SODIUM PHOSPHATE 10 MG/ML IJ SOLN
10.0000 mg | Freq: Once | INTRAMUSCULAR | Status: AC
Start: 2023-11-26 — End: 2023-11-26
  Administered 2023-11-26: 10 mg via INTRAVENOUS
  Filled 2023-11-26: qty 1

## 2023-11-26 MED ORDER — BACLOFEN 10 MG PO TABS: 10.0000 mg | ORAL_TABLET | Freq: Three times a day (TID) | ORAL | 2 refills | Status: DC | PRN

## 2023-11-26 MED ORDER — LEUCOVORIN CALCIUM INJECTION 350 MG
400.0000 mg/m2 | Freq: Once | INTRAVENOUS | Status: AC
  Administered 2023-11-26: 844 mg via INTRAVENOUS
  Filled 2023-11-26: qty 42.2

## 2023-11-26 MED ORDER — SODIUM CHLORIDE 0.9 % IV SOLN
1800.0000 mg/m2 | INTRAVENOUS | Status: DC
  Administered 2023-11-26: 3500 mg via INTRAVENOUS
  Filled 2023-11-26: qty 70

## 2023-11-26 MED ORDER — DEXTROSE 5 % IV SOLN: INTRAVENOUS | Status: DC

## 2023-11-26 MED ORDER — SODIUM CHLORIDE 0.9% FLUSH
10.0000 mL | Freq: Once | INTRAVENOUS | Status: AC
  Administered 2023-11-26: 10 mL

## 2023-11-26 MED ORDER — TRAMADOL HCL 50 MG PO TABS: 50.0000 mg | ORAL_TABLET | Freq: Four times a day (QID) | ORAL | 0 refills | Status: DC | PRN

## 2023-11-26 MED ORDER — PALONOSETRON HCL INJECTION 0.25 MG/5ML
0.2500 mg | Freq: Once | INTRAVENOUS | Status: AC
  Administered 2023-11-26: 0.25 mg via INTRAVENOUS
  Filled 2023-11-26: qty 5

## 2023-11-26 MED ORDER — OXYCODONE HCL 5 MG PO TABS: 5.0000 mg | ORAL_TABLET | ORAL | 0 refills | Status: DC | PRN

## 2023-11-26 MED ORDER — OXALIPLATIN CHEMO INJECTION 100 MG/20ML
50.0000 mg/m2 | Freq: Once | INTRAVENOUS | Status: AC
Start: 2023-11-26 — End: 2023-11-26
  Administered 2023-11-26: 100 mg via INTRAVENOUS
  Filled 2023-11-26: qty 20

## 2023-11-26 MED ORDER — ZOLPIDEM TARTRATE 10 MG PO TABS: 10.0000 mg | ORAL_TABLET | Freq: Every evening | ORAL | 0 refills | Status: DC | PRN

## 2023-11-26 NOTE — Progress Notes (Signed)
 OK to treat today with PLT 96 per Dr. Mosetta Putt

## 2023-11-26 NOTE — Patient Instructions (Addendum)
 [facility] - A DEPT OF [facility]  Discharge Instructions: Thank you for choosing [facility] to provide your oncology and hematology care.   If you have a lab appointment with the Cancer Center, please go directly to the Cancer Center and check in at the registration area.   Wear comfortable clothing and clothing appropriate for easy access to any Portacath or PICC line.   We strive to give you quality time with your provider. You may need to reschedule your appointment if you arrive late (15 or more minutes).  Arriving late affects you and other patients whose appointments are after yours.  Also, if you miss three or more appointments without notifying the office, you may be dismissed from the clinic at the provider's discretion.      For prescription refill requests, have your pharmacy contact our office and allow 72 hours for refills to be completed.    Today you received the following chemotherapy and/or immunotherapy agents: Oxaliplatin, Leucovorin, Fluorouracil.       To help prevent nausea and vomiting after your treatment, we encourage you to take your nausea medication as directed.  BELOW ARE SYMPTOMS THAT SHOULD BE REPORTED IMMEDIATELY: *FEVER GREATER THAN 100.4 F (38 C) OR HIGHER *CHILLS OR SWEATING *NAUSEA AND VOMITING THAT IS NOT CONTROLLED WITH YOUR NAUSEA MEDICATION *UNUSUAL SHORTNESS OF BREATH *UNUSUAL BRUISING OR BLEEDING *URINARY PROBLEMS (pain or burning when urinating, or frequent urination) *BOWEL PROBLEMS (unusual diarrhea, constipation, pain near the anus) TENDERNESS IN MOUTH AND THROAT WITH OR WITHOUT PRESENCE OF ULCERS (sore throat, sores in mouth, or a toothache) UNUSUAL RASH, SWELLING OR PAIN  UNUSUAL VAGINAL DISCHARGE OR ITCHING   Items with * indicate a potential emergency and should be followed up as soon as possible or go to the Emergency Department if any problems should occur.  Please show the  CHEMOTHERAPY ALERT CARD or IMMUNOTHERAPY ALERT CARD at check-in to the Emergency Department and triage nurse.  Should you have questions after your visit or need to cancel or reschedule your appointment, please contact [facility] - A DEPT OF [facility]  Dept: [phone]  and follow the prompts.  Office hours are 8:00 a.m. to 4:30 p.m. Monday - Friday. Please note that voicemails left after 4:00 p.m. may not be returned until the following business day.  We are closed weekends and major holidays. You have access to a nurse at all times for urgent questions. Please call the main number to the clinic Dept: [phone] and follow the prompts.   For any non-urgent questions, you may also contact your provider using MyChart. We now offer e-Visits for anyone 36 and older to request care online for non-urgent symptoms. For details visit [email or web address].   Also download the MyChart app! Go to the app store, search "MyChart", open the app, select [place], and log in with your MyChart username and password.

## 2023-11-26 NOTE — Progress Notes (Signed)
 [facility]   [phone] [phone]) [phone]   Clinic Follow up Note   Patient Care Team: Tonita Fallow, MD as PCP - General (Internal Medicine) Ann Mayme POUR, NP as PCP - Hematology/Oncology (Nurse Practitioner) Sheena Pugh, DO as PCP - Cardiology (Cardiology) Harden Jerona GAILS, MD as Consulting Physician (Orthopedic Surgery) Rolan Ezra RAMAN, MD as Consulting Physician (Cardiology) Lanny Callander, MD as Consulting Physician (Oncology) Pickenpack-Cousar, Fannie SAILOR, NP as Nurse Practitioner (Hospice and Palliative Medicine) Pickenpack-Cousar, Fannie SAILOR, NP as Nurse Practitioner [facility])  Date of Service:  11/26/2023  CHIEF COMPLAINT: f/u of pancreatic cancer  CURRENT THERAPY:  4th-line FOLFOX every 2 weeks  Oncology History   Pancreatic adenocarcinoma (HCC) [id number] with numerous small cavitary lesions in bilateral lungs, MMR normal  -diagnosed 07/2021, after incidental finding on CT scan for kidney stone, by colonoscopy/EUS. -Baseline CA 19-9 elevated to 273 on 07/03/21 -He began first-line mFOLFIRINOX on 9/Zachary/22, but required dose reduction and move to every 3 weeks. -his CA 19-9 trended up but is now stable. -restaging CT CAP 08/12/22 showed: multiple new and enlarging pulmonary metastases; pancreatic mass grossly stable. Will repeat in 3 months. -we changed his irinotecan  to liposomal on 09/05/22. He tolerated better and feels stronger. -Due to disease progression, his treatment was changed to gemcitabine  and Abraxane  every 2 weeks on 01/02/2023 -He tolerated the first two cycles chemotherapy very well with mild fatigue.  I increased gemcitabine  to 1000mg /m2 from cycle 3 -Restaging CT scan 03/12/2023 showed stable disease overall. His primary pancreatic tumor is slightly increased in size, however his multiple cavitary lung nodules are slightly improved with thinner walls now.   -will continue current therapy.  He is overall tolerating  well, but he has developed worsening fatigue lately.  I will reduce gemcitabine  dose to 800 mg/m again. -His tumor marker CA 19.9 continues trending down, which is a good clinical sign. -restaging CT 07/22/2023 showed moderate disease progression at the primary site and lung mets, I personally reviewed the scan images with patient and his wife in detail. -I changed his chemo to liposomal irinotecan  and 5-FU pump infusion every 2 weeks on 08/07/2023 with dose reduction due to fatigue  -restaging CT 10/22/23 showed progression in lungs  -will change chemo to dose reduced FOLFOX starting 11/26/23  -He understands the goal of chemotherapy is palliative, to prolong his life   Assessment and Plan    Pancreatic Cancer Follow-up for pancreatic cancer. Reports persistent fatigue and side pain despite a months chemo break, likely due to cancer progression and previous chemotherapy. Blood counts are well-managed with platelet count at 96. Tumor marker results pending. Plan to initiate a new chemotherapy regimen with oxaliplatin  and 5-FU at reduced doses due to previous intolerance. Discussed risks including neuropathy, nausea, appetite changes, and increased sensitivity to cold. Explained that the new regimen should be easier to tolerate than the previous three-drug combination. He agreed to try every two weeks with the option to extend to every three weeks if needed. - Administer chemotherapy with oxaliplatin  and 5-FU every two weeks - Monitor for side effects including neuropathy, nausea, and appetite changes - Advise wearing warm clothing and avoiding cold foods and drinks - Instruct to wear a mask when going out to prevent throat irritation from cold air - Refill medications: baclofen , oxycodone , tramadol , Ambien  - Schedule follow-up in two weeks - Instruct to call if experiencing severe symptoms or complications before the next appointment  Cough Intermittent cough with occasional phlegm production,  likely related to recent influenza. No significant dyspnea reported. - Monitor symptoms - Advise to call if symptoms worsen or if experiencing significant respiratory issues  Plan -Lab reviewed, adequate for treatment, will proceed for cycle FOLFOX with dose reduction today -Follow-up in 2 weeks before cycle 2 -He will follow-up with palliative care NP Nikki today   SUMMARY OF ONCOLOGIC HISTORY: Oncology History Overview Note   Cancer Staging  Pancreatic adenocarcinoma [facility]) Staging form: Exocrine Pancreas, AJCC 8th Edition - Clinical stage from 07/26/2021: Stage IV (cT4, cN0, cM1) - Signed by Lanny Callander, MD on 07/28/2021    Pancreatic adenocarcinoma (HCC)  07/02/2021 Initial Diagnosis   Pancreatic adenocarcinoma (HCC)   07/26/2021 Cancer Staging   Staging form: Exocrine Pancreas, AJCC 8th Edition - Clinical stage from 07/26/2021: Stage IV (cT4, cN0, cM1) - Signed by Lanny Callander, MD on 07/28/2021 Stage prefix: Initial diagnosis Total positive nodes: 0   9/Zachary/2022 - 07/06/2022 Chemotherapy   Patient is on Treatment Plan : PANCREAS Modified FOLFIRINOX q14d x 4 cycles      Genetic Testing   [facility] CancerNext-Expanded results (77 genes) were negative. No pathogenic variants were identified. 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 The report date is 08/15/2021.    The CancerNext-Expanded gene panel offered by [facility] and includes sequencing, rearrangement, and RNA analysis for the following 77 genes: AIP, ALK, APC, ATM, AXIN2, BAP1, BARD1, BLM, BMPR1A, BRCA1, BRCA2, BRIP1, CDC73, CDH1, CDK4, CDKN1B, CDKN2A, CHEK2, CTNNA1, DICER1, FANCC, FH, FLCN, GALNT12, KIF1B, LZTR1, MAX, MEN1, MET, MLH1, MSH2, MSH3, MSH6, MUTYH, NBN, NF1, NF2, NTHL1, PALB2, PHOX2B, PMS2, POT1, PRKAR1A, PTCH1, PTEN, RAD51C, RAD51D, RB1, RECQL, RET, SDHA, SDHAF2, SDHB, SDHC, SDHD, SMAD4, SMARCA4, SMARCB1, SMARCE1, STK11, SUFU, TMEM127, TP53, TSC1, TSC2, VHL and XRCC2 (sequencing and  deletion/duplication); EGFR, EGLN1, HOXB13, KIT, MITF, PDGFRA, POLD1, and POLE (sequencing only); EPCAM and GREM1 (deletion/duplication only).     9/Zachary/2022 - 12/06/2022 Chemotherapy   Patient is on Treatment Plan : PANCREAS Modified FOLFIRINOX q14d x 4 cycles     10/25/2021 Imaging   EXAM: CT CHEST, ABDOMEN, AND PELVIS WITH CONTRAST  IMPRESSION: 1. Innumerable bilateral pulmonary nodules, many of which are cavitary, consistent with metastatic disease. These nodules show no substantial change in are minimally progressed in the interval. 2. Mix cystic and solid lesion in the head and body of the pancreas is similar to prior and also comparing back to MRI 07/02/2021. 3. Hepatic cysts. 4. 8 mm nonobstructing left renal stone. 5. Aortic Atherosclerosis (ICD10-I70.0).   01/28/2022 Imaging   EXAM: CT CHEST, ABDOMEN, AND PELVIS WITH CONTRAST  IMPRESSION: 1. Innumerable bilateral small solid and cavitary pulmonary nodules, some of the cavitary nodules are slightly less thick-walled when compared with prior exam. 2. Mixed cystic and solid lesion in the head and body of the pancreas is similar to prior exam. 3. Nonobstructing left renal stone. 4.  Aortic Atherosclerosis (ICD10-I70.0).   11/29/2022 Imaging    IMPRESSION: 1. No significant change in primary pancreatic mass and adjacent cystic component. Unchanged appearance of soft tissue extending to involve the adjacent celiac axis, superior mesenteric artery origin, and portal confluence. 2. 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 3. Innumerable small pulmonary nodules throughout the lungs, the majority of which are cavitary. Although interval change is difficult to appreciate for the majority of these nodules due to small size, at least some of these are slightly enlarged, consistent with worsened pulmonary metastatic  disease. 4. Newly enlarged left  retroperitoneal lymph nodes, which may reflect nodal metastatic disease or perhaps reactive to left hydronephrosis. Attention on follow-up. 5. A sizable calculus previously seen in the left renal pelvis has migrated to the middle third of the left ureter, with placement of a double-J left ureteral stent, with formed pigtails in the left renal pelvis and urinary bladder. Moderate left hydronephrosis and proximal hydroureter. 6. Coronary artery disease.   Aortic Atherosclerosis (ICD10-I70.0).   01/02/2023 - 06/26/2023 Chemotherapy   Patient is on Treatment Plan : PANCREATIC Abraxane  D1,8,15 + Gemcitabine  D1,8,15 q28d     03/12/2023 Imaging    IMPRESSION: 1. Primary pancreatic mass with adjacent cystic component is slightly increased in size when compared with the prior exam. Similar soft tissue extending involve the celiac axis superior mesenteric artery origin and portal confluence. 2. Innumerable bilateral pulmonary nodules, some of which are cavitary, unchanged when compared with the prior. 3. Left retroperitoneal lymph nodes are decreased in size, likely resolving reactive adenopathy 4. Interval removal of left ureter stent.  No hydronephrosis. 5. coronary artery disease and aortic Atherosclerosis (ICD10-I70.0).   07/22/2023 Imaging   CT CAP W CONTRAST   IMPRESSION: 1. Today's study demonstrates progression of disease as evidenced by enlargement of the primary pancreatic mass, mild enlargement of numerous prominent borderline enlarged and mildly enlarged upper abdominal and retroperitoneal lymph nodes, and increased number and size of numerous metastatic nodules scattered throughout the lungs bilaterally, as detailed above. 2. 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 Main portal vein remains patent at this time. 3. 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 4. Nonobstructive nephrolithiasis in the kidneys bilaterally measuring 2-4 mm in size. 5. Aortic atherosclerosis, in addition to left main and three-vessel coronary artery disease. 6. Additional incidental findings, as above.   08/07/2023 - 10/24/2023 Chemotherapy   Patient is on Treatment Plan : PANCREAS Liposomal Irinotecan  + Leucovorin  + 5-FU IVCI q14d     11/26/2023 -  Chemotherapy   Patient is on Treatment Plan : PANCREAS FOLFOX q14d        Discussed the use of AI scribe software for clinical note transcription with the patient, who gave verbal consent to proceed.  History of Present Illness   The patient, a 68 year Reid with a history of pancreatic cancer, presents for a follow-up visit after a break from chemotherapy. He reports persistent fatigue and pain in his side, which he had hoped would improve during his break from treatment. He also mentions occasional coughing spells, during which he coughs up phlegm. Despite these symptoms, he is ready to resume treatment. He has previously experienced side effects from chemotherapy, including a metallic taste change and sensitivity to cold. He also takes baclofen , oxycodone , tramadol , and Ambien  for symptom management.         All other systems were reviewed with the patient and are negative.  MEDICAL HISTORY:  Past Medical History:  Diagnosis Date   Adenocarcinoma of pancreas, stage 4 (HCC) 07/2021   oncologist--- dr lanny;   mets to  bilateral lung;  started chemo 09-Zachary-2022   Anemia    GAD (generalized anxiety disorder)    History of adenomatous polyp of colon    Hyperlipidemia    Hypogonadism male    IBS (irritable bowel syndrome)    Insomnia    Left ureteral calculus    Metastatic cancer to lung (HCC) 07/2021   primary  pancreatic cancer w/ numerous small cavity lesions bilateral lungs   PAF (paroxysmal atrial fibrillation) (HCC) 10/31/2022   cardiologist-- dr marla. tobb;  newly dx ED 10-31-2022 w/ RVR  in setting  flank pain (kidney stone)  --  office note in epic 11-07-2022 normal ETT 11-13-2022, echo 11-07-2022 ef 65-70% w/ mild LVH, mild MR,  zio monitor not completed,  started on toprol  and xarelto  daily   Personal history of chemotherapy    Receiving chemotherapy for metastatic pancreatic cancer. Most recent infusion (as of 12/02/22) was on 11/15/22.   Port-A-Cath in place 08/10/2021   Type 2 diabetes mellitus treated with insulin  [facility])    endocrinologist--- dr von   Vitamin D  deficiency    takes Vit D supplements   Wears contact lenses     SURGICAL HISTORY: Past Surgical History:  Procedure Laterality Date   APPENDECTOMY  1988   BIOPSY  07/26/2021   Procedure: BIOPSY;  Surgeon: Wilhelmenia Aloha Raddle., MD;  Location: [facility] ENDOSCOPY;  Service: Gastroenterology;;   COLONOSCOPY WITH PROPOFOL  N/A 07/26/2021   Procedure: COLONOSCOPY WITH PROPOFOL ;  Surgeon: Wilhelmenia Aloha Raddle., MD;  Location: [facility] ENDOSCOPY;  Service: Gastroenterology;  Laterality: N/A;   CYSTOSCOPY WITH STENT PLACEMENT Left 10/31/2022   Procedure: CYSTOSCOPY WITH STENT PLACEMENT;  Surgeon: Rosalind Zachary NOVAK, MD;  Location: [facility] ORS;  Service: Urology;  Laterality: Left;   CYSTOSCOPY/URETEROSCOPY/HOLMIUM LASER/STENT PLACEMENT Left 12/03/2022   Procedure: CYSTOSCOPY/LEFT URETEROSCOPY/HOLMIUM LASER/STENT EXCHANGE;  Surgeon: Rosalind Zachary NOVAK, MD;  Location: [facility];  Service: Urology;  Laterality: Left;  1 HR FOR CASE   ESOPHAGOGASTRODUODENOSCOPY (EGD) WITH PROPOFOL  N/A 07/26/2021   Procedure: ESOPHAGOGASTRODUODENOSCOPY (EGD) WITH PROPOFOL ;  Surgeon: Wilhelmenia Aloha Raddle., MD;  Location: [facility] ENDOSCOPY;  Service: Gastroenterology;  Laterality: N/A;   EUS N/A 07/26/2021   Procedure: UPPER ENDOSCOPIC ULTRASOUND (EUS) RADIAL;  Surgeon: Wilhelmenia Aloha Raddle., MD;  Location: [facility] ENDOSCOPY;  Service: Gastroenterology;  Laterality: N/A;   FINE NEEDLE ASPIRATION  07/26/2021   Procedure: FINE NEEDLE ASPIRATION (FNA) LINEAR;   Surgeon: Wilhelmenia Aloha Raddle., MD;  Location: [facility] ENDOSCOPY;  Service: Gastroenterology;;   IR IMAGING GUIDED PORT INSERTION  08/10/2021   POLYPECTOMY  07/26/2021   Procedure: POLYPECTOMY;  Surgeon: Wilhelmenia Aloha Raddle., MD;  Location: [facility] ENDOSCOPY;  Service: Gastroenterology;;   SHOULDER SURGERY Right    as a teenager    I have reviewed the social history and family history with the patient and they are unchanged from previous note.  ALLERGIES:  is allergic to lipitor [atorvastatin].  MEDICATIONS:  Current Outpatient Medications  Medication Sig Dispense Refill   ALPRAZolam  (XANAX ) 0.5 MG tablet Take 1 tablet (0.5 mg total) by mouth 3 (three) times daily as needed for anxiety. 60 tablet 1   baclofen  (LIORESAL ) 10 MG tablet Take 1 tablet (10 mg total) by mouth 3 (three) times daily as needed for muscle spasms. 30 each 0   Cholecalciferol (VITAMIN D3) 125 MCG (5000 UT) CAPS Take 5,000 Units by mouth in the morning and at bedtime.     Continuous Glucose Sensor (DEXCOM G7 SENSOR) MISC 1 Device by Does not apply route as directed. Change sensor every 10 days 3 each 3   diphenoxylate -atropine  (LOMOTIL ) 2.5-0.025 MG tablet Take 1-2 tablets by mouth 4 (four) times daily as needed for diarrhea or loose stools. 30 tablet 0   gabapentin  (NEURONTIN ) 600 MG tablet Take 1 tablet (600 mg total) by mouth 3 (three) times daily as needed (pain). 90 tablet 0   glucose blood test  strip Use as instructed 100 each 12   hyoscyamine  (LEVSIN  SL) 0.125 MG SL tablet DISSOLVE ONE TABLET UNDER THE TONGUE 4 TIMES DAILY UP TO EVERY 4 HOURS AS NEEDED FOR NAUSEA, BLOATING, CRAMPING, OR DIARRHEA 100 tablet 0   insulin  degludec (TRESIBA  FLEXTOUCH) 100 UNIT/ML FlexTouch Pen INJECT 32 UNITS INTO THE SKIN DAILY. 15 mL 4   insulin  lispro (HUMALOG  KWIKPEN) 100 UNIT/ML KwikPen 10 TO 15 UNITS BEFORE MEALS AS DIRECTED 15 mL 0   lipase/protease/amylase (CREON ) 36000 UNITS CPEP capsule Take 1 capsule (36,000 Units total) by mouth  3 (three) times daily with meals. May also take 1 capsule (36,000 Units total) as needed (with snacks - up to 4 snacks daily). 150 capsule 11   metoprolol  succinate (TOPROL -XL) 25 MG 24 hr tablet TAKE 1/2 TABLET BY MOUTH EVERY DAY 45 tablet 3   mirtazapine  (REMERON ) 7.5 MG tablet Take 1 tablet (7.5 mg total) by mouth at bedtime. 90 tablet 1   ondansetron  (ZOFRAN ) 8 MG tablet Take 1 tablet (8 mg total) by mouth every 8 (eight) hours as needed for nausea or vomiting. 60 tablet 1   oxyCODONE  (ROXICODONE ) 5 MG immediate release tablet Take 1 tablet (5 mg total) by mouth every 6 (six) hours as needed for severe pain. 30 tablet 0   prochlorperazine  (COMPAZINE ) 10 MG tablet Take 1 tablet (10 mg total) by mouth every 6 (six) hours as needed (Nausea or vomiting). 60 tablet 1   rivaroxaban  (XARELTO ) 20 MG TABS tablet Take 1 tablet (20 mg total) by mouth daily with supper. 90 tablet 3   silver  sulfADIAZINE  (SILVADENE ) 1 % cream Apply 1 Application topically daily. 20 g 0   traMADol  (ULTRAM ) 50 MG tablet Take 1-2 tablets (50-100 mg total) by mouth every 6 (six) hours as needed. 120 tablet 0   zolpidem  (AMBIEN ) 10 MG tablet Take 1 tablet (10 mg total) by mouth at bedtime as needed. for sleep 60 tablet 0   No current facility-administered medications for this visit.    PHYSICAL EXAMINATION: ECOG PERFORMANCE STATUS: 2 - Symptomatic, <50% confined to bed  Vitals:   11/26/23 0957  BP: 122/86  Pulse: 93  Resp: 17  Temp: 98.5 F (36.9 C)  SpO2: 96%   Wt Readings from Last 3 Encounters:  11/26/23 193 lb 6.4 oz (87.7 kg)  10/22/23 196 lb 9.6 oz (89.2 kg)  10/01/23 191 lb 14.4 oz (87 kg)     GENERAL:alert, no distress and comfortable SKIN: skin color, texture, turgor are normal, no rashes or significant lesions EYES: normal, Conjunctiva are pink and non-injected, sclera clear NECK: supple, thyroid  normal size, non-tender, without nodularity LYMPH:  no palpable lymphadenopathy in the cervical, axillary   LUNGS: clear to auscultation and percussion with normal breathing effort HEART: regular rate & rhythm and no murmurs and no lower extremity edema ABDOMEN:abdomen soft, non-tender and normal bowel sounds Musculoskeletal:no cyanosis of digits and no clubbing  NEURO: alert & oriented x 3 with fluent speech, no focal motor/sensory deficits    LABORATORY DATA:  I have reviewed the data as listed    Latest Ref Rng & Units 11/26/2023    9:29 AM 10/22/2023    8:43 AM 10/01/2023   11:07 AM  CBC  WBC 4.0 - 10.5 K/uL 7.3  5.1  6.7   Hemoglobin 13.0 - 17.0 g/dL 87.5  87.4  86.7   Hematocrit 39.0 - 52.0 % 36.0  36.3  36.9   Platelets 150 - 400 K/uL 96  120  107         Latest Ref Rng & Units 11/26/2023    9:29 AM 10/22/2023    8:43 AM 10/01/2023   11:07 AM  CMP  Glucose 70 - 99 mg/dL 864  785  766   BUN 8 - 23 mg/dL 15  13  16    Creatinine 0.61 - 1.24 mg/dL 9.23  9.16  9.12   Sodium 135 - 145 mmol/L 140  140  139   Potassium 3.5 - 5.1 mmol/L 3.8  3.4  3.9   Chloride 98 - 111 mmol/L 108  108  107   CO2 22 - 32 mmol/L Zachary  Zachary  28    Calcium  8.9 - 10.3 mg/dL 9.0  9.1  9.4   Total Protein 6.5 - 8.1 g/dL 6.6  6.4  6.6   Total Bilirubin 0.0 - 1.2 mg/dL 0.8  0.6  0.8   Alkaline Phos 38 - 126 U/L 152  120  108   AST 15 - 41 U/L 25  22  18    ALT 0 - 44 U/L Zachary  24  21        RADIOGRAPHIC STUDIES: I have personally reviewed the radiological images as listed and agreed with the findings in the report. No results found.    No orders of the defined types were placed in this encounter.  All questions were answered. The patient knows to call the clinic with any problems, questions or concerns. No barriers to learning was detected. The total time spent in the appointment was 25 minutes.     Onita Mattock, MD 11/26/2023

## 2023-11-27 ENCOUNTER — Ambulatory Visit: Payer: Medicare Other | Admitting: Endocrinology

## 2023-11-27 LAB — CANCER ANTIGEN 19-9: CA 19-9: 2511 U/mL — ABNORMAL HIGH (ref 0–35)

## 2023-11-28 ENCOUNTER — Inpatient Hospital Stay: Payer: Medicare Other

## 2023-11-28 ENCOUNTER — Encounter: Payer: Self-pay | Admitting: Hematology

## 2023-11-28 VITALS — BP 115/76 | HR 103 | Temp 99.2°F | Resp 18

## 2023-11-28 DIAGNOSIS — C259 Malignant neoplasm of pancreas, unspecified: Secondary | ICD-10-CM

## 2023-11-28 MED ORDER — SODIUM CHLORIDE 0.9% FLUSH: 10.0000 mL | INTRAVENOUS | Status: DC | PRN

## 2023-11-28 MED ORDER — HEPARIN SOD (PORK) LOCK FLUSH 100 UNIT/ML IV SOLN
500.0000 [IU] | Freq: Once | INTRAVENOUS | Status: DC | PRN
Start: 2023-11-28 — End: 2023-11-28

## 2023-11-29 ENCOUNTER — Encounter: Payer: Self-pay | Admitting: Hematology

## 2023-12-02 ENCOUNTER — Telehealth: Payer: Self-pay

## 2023-12-02 NOTE — Telephone Encounter (Signed)
 Pt called with c/o constipation x 3 days. Instructed to take mirilax twice a day and to call back if no BM by Thursday or Friday. Also discussed the usage of Magnesium Citrate (OTC) if constipation persists. Pt verbalized understanding and agreement with plan. Pt also wishes to review labs with Dr.Feng, call routed to her office for review. No further needs at this time.

## 2023-12-03 ENCOUNTER — Other Ambulatory Visit: Payer: Self-pay

## 2023-12-03 ENCOUNTER — Encounter: Payer: Self-pay | Admitting: Hematology

## 2023-12-03 MED ORDER — MORPHINE SULFATE ER 15 MG PO TBCR: 15.0000 mg | EXTENDED_RELEASE_TABLET | Freq: Two times a day (BID) | ORAL | 0 refills | Status: DC

## 2023-12-03 NOTE — Telephone Encounter (Signed)
 Pt called reporting an increase in pain, oxycodone  is helping with his pain but it is not lasting long enough or controlling his back pain per Mr.Joens. patient spoke with Landa Pine, NP who suggested the use of a long acting pain medication. Pt educated on how medication works and when to take it and when to call the office. Verbalized understanding, no further needs at this time.

## 2023-12-04 ENCOUNTER — Other Ambulatory Visit: Payer: Medicare Other

## 2023-12-04 ENCOUNTER — Ambulatory Visit: Payer: Medicare Other

## 2023-12-04 ENCOUNTER — Ambulatory Visit: Payer: Medicare Other | Admitting: Hematology

## 2023-12-05 ENCOUNTER — Other Ambulatory Visit: Payer: Self-pay | Admitting: Nurse Practitioner

## 2023-12-05 DIAGNOSIS — G893 Neoplasm related pain (acute) (chronic): Secondary | ICD-10-CM

## 2023-12-05 DIAGNOSIS — Z515 Encounter for palliative care: Secondary | ICD-10-CM

## 2023-12-05 DIAGNOSIS — C259 Malignant neoplasm of pancreas, unspecified: Secondary | ICD-10-CM

## 2023-12-05 MED ORDER — MORPHINE SULFATE ER 15 MG PO TBCR: 15.0000 mg | EXTENDED_RELEASE_TABLET | Freq: Two times a day (BID) | ORAL | 0 refills | Status: DC

## 2023-12-05 MED ORDER — XTAMPZA ER 9 MG PO C12A: 9.0000 mg | EXTENDED_RELEASE_CAPSULE | Freq: Two times a day (BID) | ORAL | 0 refills | Status: DC

## 2023-12-09 NOTE — Progress Notes (Unsigned)
Palliative Medicine [facility]  [phone] [phone]   Name: Zachary Reid Date: 12/09/2023 MRN: [id number]  DOB: 1955/12/14  Patient Care Team: Lucky Cowboy, MD as PCP - General (Internal Medicine) Pollyann Samples, NP as PCP - Hematology/Oncology (Nurse Practitioner) Thomasene Ripple, DO as PCP - Cardiology (Cardiology) Nadara Mustard, MD as Consulting Physician (Orthopedic Surgery) Laurey Morale, MD as Consulting Physician (Cardiology) Malachy Mood, MD as Consulting Physician (Oncology) Pickenpack-Cousar, Arty Baumgartner, NP as Nurse Practitioner (Hospice and Palliative Medicine) Pickenpack-Cousar, Arty Baumgartner, NP as Nurse Practitioner [facility] and Palliative Medicine)   INTERVAL HISTORY: Zachary Reid is a 68 y.o. male with oncologic medical history including pancreatic adenocarcinoma (06/2021), HLD, diabetes, CKD, HTN, and anxiety. Palliative ask to see for symptom management and goals of care.   SOCIAL HISTORY:     reports that he has never smoked. He has never used smokeless tobacco. He reports that he does not drink alcohol and does not use drugs.  ADVANCE DIRECTIVES:  Advanced directives on file naming wife, Finnian Albanez, as patient healthcare power of attorney, and daughter, Miykael Sarte as Counsellor.   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 tobb;   newly dx ED 10-31-2022 w/ RVR  in setting flank pain (kidney stone)  --  office note in epic 11-07-2022 normal ETT 11-13-2022, echo 11-07-2022 ef 65-70% w/ mild LVH, mild MR,  zio monitor not completed,  started on toprol and xarelto daily   Personal history of chemotherapy    Receiving chemotherapy for metastatic pancreatic cancer. Most recent infusion (as of 12/02/22) was on 11/15/22.   Port-A-Cath in place 08/10/2021   Type 2 diabetes mellitus treated with insulin [facility])    endocrinologist--- dr Lucianne Muss   Vitamin D deficiency    takes Vit D supplements   Wears contact lenses     ALLERGIES:  is allergic to lipitor [atorvastatin].  MEDICATIONS:  Current Outpatient Medications  Medication Sig Dispense Refill   ALPRAZolam (XANAX) 0.5 MG tablet Take 1 tablet (0.5 mg total) by mouth 3 (three) times daily as needed for anxiety. 60 tablet 1   baclofen (LIORESAL) 10 MG tablet Take 1 tablet (10 mg total) by mouth 3 (three) times daily as needed for muscle spasms. 30 each 2   Cholecalciferol (VITAMIN D3) 125 MCG (5000 UT) CAPS Take 5,000 Units by mouth in the morning and at bedtime.     Continuous Glucose Sensor (DEXCOM G7 SENSOR) MISC 1 Device by Does not apply route as directed. Change sensor every 10 days 3 each 3   diphenoxylate-atropine (LOMOTIL) 2.5-0.025 MG tablet Take 1-2 tablets by mouth 4 (four) times daily as needed for diarrhea or loose stools. 30 tablet 0   gabapentin (NEURONTIN) 600 MG tablet Take 1 tablet (600 mg total) by mouth 3 (three) times daily as needed (pain).  90 tablet 0   glucose blood test strip Use as instructed 100 each 12   hyoscyamine (LEVSIN SL) 0.125 MG SL tablet DISSOLVE ONE TABLET UNDER THE TONGUE 4 TIMES DAILY UP TO EVERY 4 HOURS AS NEEDED FOR NAUSEA, BLOATING, CRAMPING, OR DIARRHEA 100 tablet 0   insulin degludec (TRESIBA FLEXTOUCH) 100 UNIT/ML FlexTouch Pen INJECT 32 UNITS INTO THE SKIN DAILY. 15 mL 4   insulin lispro (HUMALOG KWIKPEN) 100 UNIT/ML KwikPen 10 TO 15  UNITS BEFORE MEALS AS DIRECTED 15 mL 0   lipase/protease/amylase (CREON) 36000 UNITS CPEP capsule Take 1 capsule (36,000 Units total) by mouth 3 (three) times daily with meals. May also take 1 capsule (36,000 Units total) as needed (with snacks - up to 4 snacks daily). 150 capsule 11   metoprolol succinate (TOPROL-XL) 25 MG 24 hr tablet TAKE 1/2 TABLET BY MOUTH EVERY DAY 45 tablet 3   mirtazapine (REMERON) 7.5 MG tablet Take 1 tablet (7.5 mg total) by mouth at bedtime. 90 tablet 1   morphine (MS CONTIN) 15 MG 12 hr tablet Take 1 tablet (15 mg total) by mouth every 12 (twelve) hours. 60 tablet 0   ondansetron (ZOFRAN) 8 MG tablet Take 1 tablet (8 mg total) by mouth every 8 (eight) hours as needed for nausea or vomiting. 60 tablet 1   oxyCODONE (ROXICODONE) 5 MG immediate release tablet Take 1-2 tablets (5-10 mg total) by mouth every 4 (four) hours as needed for severe pain (pain score 7-10). 90 tablet 0   prochlorperazine (COMPAZINE) 10 MG tablet Take 1 tablet (10 mg total) by mouth every 6 (six) hours as needed (Nausea or vomiting). 60 tablet 1   rivaroxaban (XARELTO) 20 MG TABS tablet Take 1 tablet (20 mg total) by mouth daily with supper. 90 tablet 3   silver sulfADIAZINE (SILVADENE) 1 % cream Apply 1 Application topically daily. 20 g 0   zolpidem (AMBIEN) 10 MG tablet Take 1 tablet (10 mg total) by mouth at bedtime as needed. for sleep 60 tablet 0   No current facility-administered medications for this visit.    VITAL SIGNS: There were no vitals taken for this visit. There were no vitals filed for this visit.  Estimated body mass index is 26.97 kg/m as calculated from the following:   Height as of 10/01/23: 5\' 11"  (1.803 m).   Weight as of 11/26/23: 193 lb 6.4 oz (87.7 kg).   PERFORMANCE STATUS (ECOG) : 1 - Symptomatic but completely ambulatory   Physical Exam General: NAD Cardiovascular: regular rate and rhythm Pulmonary: normal breathing pattern  Extremities: no edema, no joint  deformities Skin: no rashes Neurological: alert and oriented x3  Discussed the use of AI scribe software for clinical note transcription with the patient, who gave verbal consent to proceed.   IMPRESSION:  I saw Mr. Furse during his infusion. No acute distress. Tolerating without difficulty. Denies nausea, vomiting, and diarrhea. He is managing his constipation with daily senna and Miralax. Appetite is fair. He is trying to remain as active as possible.  We discussed his pain at length. [place] reports a positive response to his current medication regimen, which includes morphine er and oxycodone. He describes taking oxycodone every four hours as needed when active and morphine er every twelve hours as prescribed. Tramadol has been discontinued. The patient notes a significant improvement in his pain levels since the adjustment of his medication, with the highest pain level reported as a 7-8 out of 10, down from a previous 10. The  lowest pain level is reported as a 3-4 out of 10.  Despite these improvements, the patient expresses a desire for further pain reduction, aiming for a more consistent and comfortable state. He expresses concern about reaching the maximum dosage of his medication and having no further options for pain management. Support provided. I discussed realistic expectations with understanding he may not gain complete pain resolution however if pain decreases to a manageable state of comfort this would be indicative of achievement. He verbalized understanding. We will continue on current regimen for another week with close evaluation. Will make adjustments as needed.   All questions answered and support provided.  Assessment and Plan  Cancer Related Pain Management Patient reports improvement in pain control with current regimen of morphine and oxycodone. Pain levels range from 3-4 at best to 7-8 at worst. This is an improvement from previous weeks where he was experiencing  "severe" daily episodes. Discussed the goal of achieving a more steady, comfortable state, acknowledging that complete elimination of pain is unlikely. -Continue current regimen of morphine er 15mg  every 12 hours and oxycodone 5-10mg  every 4 hours as needed for breakthrough pain. -Plan to adjust morphine dosage if needed, in upcoming weeks.  -Telephone follow-up in one week to assess pain control and make medication adjustments if necessary.  -Constipation controlled with daily stool softeners.   I will plan to follow up in person in 2-3 weeks.  Patient expressed understanding and was in agreement with this plan. He also understands that He can call the clinic at any time with any questions, concerns, or complaints.   Any controlled substances utilized were prescribed in the context of palliative care. PDMP has been reviewed.   Visit consisted of counseling and education dealing with the complex and emotionally intense issues of symptom management and palliative care in the setting of serious and potentially life-threatening illness.  Willette Alma, AGPCNP-BC  Palliative Medicine Team/[facility]

## 2023-12-10 ENCOUNTER — Other Ambulatory Visit: Payer: Self-pay

## 2023-12-10 DIAGNOSIS — C259 Malignant neoplasm of pancreas, unspecified: Secondary | ICD-10-CM

## 2023-12-10 MED ORDER — OXYCODONE HCL 5 MG PO TABS
5.0000 mg | ORAL_TABLET | ORAL | 0 refills | Status: DC | PRN
Start: 2023-12-10 — End: 2023-12-19

## 2023-12-10 NOTE — Progress Notes (Unsigned)
Patient Care Team: Lucky Cowboy, MD as PCP - General (Internal Medicine) Pollyann Samples, NP as PCP - Hematology/Oncology (Nurse Practitioner) Thomasene Ripple, DO as PCP - Cardiology (Cardiology) Nadara Mustard, MD as Consulting Physician (Orthopedic Surgery) Laurey Morale, MD as Consulting Physician (Cardiology) Malachy Mood, MD as Consulting Physician (Oncology) Pickenpack-Cousar, Arty Baumgartner, NP as Nurse Practitioner (Hospice and Palliative Medicine) Pickenpack-Cousar, Arty Baumgartner, NP as Nurse Practitioner 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 -Baseline CA 19-9 elevated to 273 on 07/03/21 -He began first-line mFOLFIRINOX on 08/13/21, but required dose reduction and move to every 3 weeks. -his CA 19-9 trended up but is now stable. -restaging CT CAP 08/12/22 showed: multiple new and enlarging pulmonary metastases; pancreatic mass grossly stable. Will repeat in 3 months. -we changed his irinotecan to liposomal on 09/05/22. He tolerated better and feels stronger. -Due to disease progression, his treatment was changed to gemcitabine and Abraxane every 2 weeks on 01/02/2023 -He tolerated the first two cycles chemotherapy very well with mild fatigue.  I increased gemcitabine to 1000mg /m2 from cycle 3 -Restaging CT scan 03/12/2023 showed stable disease overall. His primary pancreatic tumor is slightly increased in size, however his multiple cavitary lung nodules are slightly improved with thinner walls now.   -will continue current therapy.  He is overall tolerating well, but he has developed worsening fatigue lately.  I will reduce gemcitabine dose to 800 mg/m again. -His tumor marker CA 19.9 continues  trending down, which is a good clinical sign. -restaging CT 07/22/2023 showed moderate disease progression at the primary site and lung mets, I personally reviewed the scan images with patient and his wife in detail. -I changed his chemo to liposomal irinotecan and 5-FU pump infusion every 2 weeks on 08/07/2023 with dose reduction due to fatigue  -restaging CT 10/22/23 showed progression in lungs  -will change chemo to dose reduced FOLFOX starting 11/26/23  -He understands the goal of chemotherapy is palliative, to prolong his life     The patient understands the plans discussed today and is in agreement with them.  He knows to contact our office if he develops concerns prior to his next appointment.  I provided *** minutes of face-to-face time during this encounter and > 50% was spent counseling as documented under my assessment and plan.    Zachary Jews, NP  [facility] - A DEPT OF [facility] [address] [place] [address] Dept: [phone] Dept Fax: [phone]   No orders of the defined types were placed in this encounter.     CHIEF COMPLAINT:  CC: Pancreatic adenocarcinoma  Current Treatment: Fourth line FOLFOX every 2 weeks  INTERVAL HISTORY:  Zachary Reid is here today for repeat clinical assessment.  He was last seen by Dr. Mosetta Putt on 11/26/2023.  He received initial round of dose reduced FOLFOX on that day due to progression of disease and worsening tumor marker CA 19-9.  Was having persistent fatigue and side pain.  Today is cycle 2 day 1 reduced dose FOLFOX.  He denies fevers or chills. He denies pain. His appetite is good. His weight {Weight change:10426}.  I have reviewed the past medical history, past  surgical history, social history and family history with the patient and they are unchanged from previous note.  ALLERGIES:  is allergic to lipitor [atorvastatin].  MEDICATIONS:  Current Outpatient  Medications  Medication Sig Dispense Refill   ALPRAZolam (XANAX) 0.5 MG tablet Take 1 tablet (0.5 mg total) by mouth 3 (three) times daily as needed for anxiety. 60 tablet 1   baclofen (LIORESAL) 10 MG tablet Take 1 tablet (10 mg total) by mouth 3 (three) times daily as needed for muscle spasms. 30 each 2   Cholecalciferol (VITAMIN D3) 125 MCG (5000 UT) CAPS Take 5,000 Units by mouth in the morning and at bedtime.     Continuous Glucose Sensor (DEXCOM G7 SENSOR) MISC 1 Device by Does not apply route as directed. Change sensor every 10 days 3 each 3   diphenoxylate-atropine (LOMOTIL) 2.5-0.025 MG tablet Take 1-2 tablets by mouth 4 (four) times daily as needed for diarrhea or loose stools. 30 tablet 0   gabapentin (NEURONTIN) 600 MG tablet Take 1 tablet (600 mg total) by mouth 3 (three) times daily as needed (pain). 90 tablet 0   glucose blood test strip Use as instructed 100 each 12   hyoscyamine (LEVSIN SL) 0.125 MG SL tablet DISSOLVE ONE TABLET UNDER THE TONGUE 4 TIMES DAILY UP TO EVERY 4 HOURS AS NEEDED FOR NAUSEA, BLOATING, CRAMPING, OR DIARRHEA 100 tablet 0   insulin degludec (TRESIBA FLEXTOUCH) 100 UNIT/ML FlexTouch Pen INJECT 32 UNITS INTO THE SKIN DAILY. 15 mL 4   insulin lispro (HUMALOG KWIKPEN) 100 UNIT/ML KwikPen 10 TO 15 UNITS BEFORE MEALS AS DIRECTED 15 mL 0   lipase/protease/amylase (CREON) 36000 UNITS CPEP capsule Take 1 capsule (36,000 Units total) by mouth 3 (three) times daily with meals. May also take 1 capsule (36,000 Units total) as needed (with snacks - up to 4 snacks daily). 150 capsule 11   metoprolol succinate (TOPROL-XL) 25 MG 24 hr tablet TAKE 1/2 TABLET BY MOUTH EVERY DAY 45 tablet 3   mirtazapine (REMERON) 7.5 MG tablet Take 1 tablet (7.5 mg total) by mouth at bedtime. 90 tablet 1   morphine (MS CONTIN) 15 MG 12 hr tablet Take 1 tablet (15 mg total) by mouth every 12 (twelve) hours. 60 tablet 0   ondansetron (ZOFRAN) 8 MG tablet Take 1 tablet (8 mg total) by mouth every 8  (eight) hours as needed for nausea or vomiting. 60 tablet 1   oxyCODONE (ROXICODONE) 5 MG immediate release tablet Take 1-2 tablets (5-10 mg total) by mouth every 4 (four) hours as needed for severe pain (pain score 7-10). 90 tablet 0   prochlorperazine (COMPAZINE) 10 MG tablet Take 1 tablet (10 mg total) by mouth every 6 (six) hours as needed (Nausea or vomiting). 60 tablet 1   rivaroxaban (XARELTO) 20 MG TABS tablet Take 1 tablet (20 mg total) by mouth daily with supper. 90 tablet 3   silver sulfADIAZINE (SILVADENE) 1 % cream Apply 1 Application topically daily. 20 g 0   zolpidem (AMBIEN) 10 MG tablet Take 1 tablet (10 mg total) by mouth at bedtime as needed. for sleep 60 tablet 0   No current facility-administered medications for this visit.    HISTORY OF PRESENT ILLNESS:   Oncology History Overview Note   Cancer Staging  Pancreatic adenocarcinoma [facility]) Staging form: Exocrine Pancreas, AJCC 8th Edition - Clinical stage from 07/26/2021: Stage IV (cT4, cN0, cM1) - Signed by Malachy Mood, MD on 07/28/2021    Pancreatic adenocarcinoma (HCC)  07/02/2021 Initial Diagnosis  Pancreatic adenocarcinoma (HCC)   07/26/2021 Cancer Staging   Staging form: Exocrine Pancreas, AJCC 8th Edition - Clinical stage from 07/26/2021: Stage IV (cT4, cN0, cM1) - Signed by Malachy Mood, MD on 07/28/2021 Stage prefix: Initial diagnosis Total positive nodes: 0   08/13/2021 - 07/06/2022 Chemotherapy   Patient is on Treatment Plan : PANCREAS Modified FOLFIRINOX q14d x 4 cycles      Genetic Testing   [facility] CancerNext-Expanded results (77 genes) were negative. No pathogenic variants were identified. 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 The report date is 08/15/2021.    The CancerNext-Expanded gene panel offered by [facility] and includes sequencing, rearrangement, and RNA analysis for the following 77 genes: AIP, ALK, APC, ATM, AXIN2, BAP1, BARD1, BLM, BMPR1A, BRCA1, BRCA2, BRIP1,  CDC73, CDH1, CDK4, CDKN1B, CDKN2A, CHEK2, CTNNA1, DICER1, FANCC, FH, FLCN, GALNT12, KIF1B, LZTR1, MAX, MEN1, MET, MLH1, MSH2, MSH3, MSH6, MUTYH, NBN, NF1, NF2, NTHL1, PALB2, PHOX2B, PMS2, POT1, PRKAR1A, PTCH1, PTEN, RAD51C, RAD51D, RB1, RECQL, RET, SDHA, SDHAF2, SDHB, SDHC, SDHD, SMAD4, SMARCA4, SMARCB1, SMARCE1, STK11, SUFU, TMEM127, TP53, TSC1, TSC2, VHL and XRCC2 (sequencing and deletion/duplication); EGFR, EGLN1, HOXB13, KIT, MITF, PDGFRA, POLD1, and POLE (sequencing only); EPCAM and GREM1 (deletion/duplication only).     08/13/2021 - 12/06/2022 Chemotherapy   Patient is on Treatment Plan : PANCREAS Modified FOLFIRINOX q14d x 4 cycles     10/25/2021 Imaging   EXAM: CT CHEST, ABDOMEN, AND PELVIS WITH CONTRAST  IMPRESSION: 1. Innumerable bilateral pulmonary nodules, many of which are cavitary, consistent with metastatic disease. These nodules show no substantial change in are minimally progressed in the interval. 2. Mix cystic and solid lesion in the head and body of the pancreas is similar to prior and also comparing back to MRI 07/02/2021. 3. Hepatic cysts. 4. 8 mm nonobstructing left renal stone. 5. Aortic Atherosclerosis (ICD10-I70.0).   01/28/2022 Imaging   EXAM: CT CHEST, ABDOMEN, AND PELVIS WITH CONTRAST  IMPRESSION: 1. Innumerable bilateral small solid and cavitary pulmonary nodules, some of the cavitary nodules are slightly less thick-walled when compared with prior exam. 2. Mixed cystic and solid lesion in the head and body of the pancreas is similar to prior exam. 3. Nonobstructing left renal stone. 4.  Aortic Atherosclerosis (ICD10-I70.0).   11/29/2022 Imaging    IMPRESSION: 1. No significant change in primary pancreatic mass and adjacent cystic component. Unchanged appearance of soft tissue extending to involve the adjacent celiac axis, superior mesenteric artery origin, and portal confluence. 2. 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 3. Innumerable small pulmonary nodules throughout the lungs, the majority of which are cavitary. Although interval change is difficult to appreciate for the majority of these nodules due to small size, at least some of these are slightly enlarged, consistent with worsened pulmonary metastatic disease. 4. Newly enlarged left retroperitoneal lymph nodes, which may reflect nodal metastatic disease or perhaps reactive to left hydronephrosis. Attention on follow-up. 5. A sizable calculus previously seen in the left renal pelvis has migrated to the middle third of the left ureter, with placement of a double-J left ureteral stent, with formed pigtails in the left renal pelvis and urinary bladder. Moderate left hydronephrosis and proximal hydroureter. 6. Coronary artery disease.   Aortic Atherosclerosis (ICD10-I70.0).   01/02/2023 - 06/26/2023 Chemotherapy   Patient is on Treatment Plan : PANCREATIC Abraxane D1,8,15 + Gemcitabine D1,8,15 q28d     03/12/2023 Imaging    IMPRESSION: 1. Primary  pancreatic mass with adjacent cystic component is slightly increased in size when compared with the prior exam. Similar soft tissue extending involve the celiac axis superior mesenteric artery origin and portal confluence. 2. Innumerable bilateral pulmonary nodules, some of which are cavitary, unchanged when compared with the prior. 3. Left retroperitoneal lymph nodes are decreased in size, likely resolving reactive adenopathy 4. Interval removal of left ureter stent.  No hydronephrosis. 5. coronary artery disease and aortic Atherosclerosis (ICD10-I70.0).   07/22/2023 Imaging   CT CAP W CONTRAST   IMPRESSION: 1. Today's study demonstrates progression of disease as evidenced by enlargement of the primary pancreatic mass, mild enlargement of numerous prominent borderline enlarged and mildly enlarged upper abdominal and retroperitoneal lymph nodes, and  increased number and size of numerous metastatic nodules scattered throughout the lungs bilaterally, as detailed above. 2. 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 Main portal vein remains patent at this time. 3. 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 4. Nonobstructive nephrolithiasis in the kidneys bilaterally measuring 2-4 mm in size. 5. Aortic atherosclerosis, in addition to left main and three-vessel coronary artery disease. 6. Additional incidental findings, as above.   08/07/2023 - 10/24/2023 Chemotherapy   Patient is on Treatment Plan : PANCREAS Liposomal Irinotecan + Leucovorin + 5-FU IVCI q14d     11/26/2023 -  Chemotherapy   Patient is on Treatment Plan : PANCREAS FOLFOX q14d         REVIEW OF SYSTEMS:   Constitutional: Denies fevers, chills or abnormal weight loss Eyes: Denies blurriness of vision Ears, nose, mouth, throat, and face: Denies mucositis or sore throat Respiratory: Denies cough, dyspnea or wheezes Cardiovascular: Denies palpitation, chest discomfort or lower extremity swelling Gastrointestinal:  Denies nausea, heartburn or change in bowel habits Skin: Denies abnormal skin rashes Lymphatics: Denies new lymphadenopathy or easy bruising Neurological:Denies numbness, tingling or new weaknesses Behavioral/Psych: Mood is stable, no new changes  All other systems were reviewed with the patient and are negative.   VITALS:  There were no vitals taken for this visit.  Wt Readings from Last 3 Encounters:  11/26/23 193 lb 6.4 oz (87.7 kg)  10/22/23 196 lb 9.6 oz (89.2 kg)  10/01/23 191 lb 14.4 oz (87 kg)    There is no height or weight on file to calculate BMI.  Performance status (ECOG): {CHL ONC [id number]  PHYSICAL EXAM:   GENERAL:alert, no distress and comfortable SKIN: skin color, texture, turgor are normal, no  rashes or significant lesions EYES: normal, Conjunctiva are pink and non-injected, sclera clear OROPHARYNX:no exudate, no erythema and lips, buccal mucosa, and tongue normal  NECK: supple, thyroid normal size, non-tender, without nodularity LYMPH:  no palpable lymphadenopathy in the cervical, axillary or inguinal LUNGS: clear to auscultation and percussion with normal breathing effort HEART: regular rate & rhythm and no murmurs and no lower extremity edema ABDOMEN:abdomen soft, non-tender and normal bowel sounds Musculoskeletal:no cyanosis of digits and no clubbing  NEURO: alert & oriented x 3 with fluent speech, no focal motor/sensory deficits  LABORATORY DATA:  I have reviewed the data as listed    Component Value Date/Time   NA 140 11/26/2023 0929   K 3.8 11/26/2023 0929   CL 108 11/26/2023 0929   CO2 26 11/26/2023 0929   GLUCOSE 135 (H) 11/26/2023 0929   BUN 15 11/26/2023 0929   CREATININE 0.76 11/26/2023 0929   CREATININE 0.88 06/05/2021 1130   CALCIUM  9.0 11/26/2023 0929   PROT 6.6 11/26/2023 0929   ALBUMIN 3.8 11/26/2023 0929   AST 25 11/26/2023 0929   ALT 26 11/26/2023 0929   ALKPHOS 152 (H) 11/26/2023 0929   BILITOT 0.8 11/26/2023 0929   GFRNONAA >60 11/26/2023 0929   GFRNONAA 96 03/05/2021 0915   GFRAA 112 03/05/2021 0915    No results found for: "SPEP", "UPEP"  Lab Results  Component Value Date   WBC 7.3 11/26/2023   NEUTROABS 5.5 11/26/2023   HGB 12.4 (L) 11/26/2023   HCT 36.0 (L) 11/26/2023   MCV 97.8 11/26/2023   PLT 96 (L) 11/26/2023      Chemistry      Component Value Date/Time   NA 140 11/26/2023 0929   K 3.8 11/26/2023 0929   CL 108 11/26/2023 0929   CO2 26 11/26/2023 0929   BUN 15 11/26/2023 0929   CREATININE 0.76 11/26/2023 0929   CREATININE 0.88 06/05/2021 1130      Component Value Date/Time   CALCIUM 9.0 11/26/2023 0929   ALKPHOS 152 (H) 11/26/2023 0929   AST 25 11/26/2023 0929   ALT 26 11/26/2023 0929   BILITOT 0.8 11/26/2023 0929        RADIOGRAPHIC STUDIES: I have personally reviewed the radiological images as listed and agreed with the findings in the report. No results found.

## 2023-12-10 NOTE — Assessment & Plan Note (Signed)
[id number] with numerous small cavitary lesions in bilateral lungs, MMR normal  -diagnosed 07/2021, after incidental finding on CT scan for kidney stone, by colonoscopy/EUS. -Baseline CA 19-9 elevated to 273 on 07/03/21 -He began first-line mFOLFIRINOX on 08/13/21, but required dose reduction and move to every 3 weeks. -his CA 19-9 trended up but is now stable. -restaging CT CAP 08/12/22 showed: multiple new and enlarging pulmonary metastases; pancreatic mass grossly stable. Will repeat in 3 months. -we changed his irinotecan to liposomal on 09/05/22. He tolerated better and feels stronger. -Due to disease progression, his treatment was changed to gemcitabine and Abraxane every 2 weeks on 01/02/2023 -He tolerated the first two cycles chemotherapy very well with mild fatigue.  I increased gemcitabine to 1000mg /m2 from cycle 3 -Restaging CT scan 03/12/2023 showed stable disease overall. His primary pancreatic tumor is slightly increased in size, however his multiple cavitary lung nodules are slightly improved with thinner walls now.   -will continue current therapy.  He is overall tolerating well, but he has developed worsening fatigue lately.  I will reduce gemcitabine dose to 800 mg/m again. -His tumor marker CA 19.9 continues trending down, which is a good clinical sign. -restaging CT 07/22/2023 showed moderate disease progression at the primary site and lung mets, I personally reviewed the scan images with patient and his wife in detail. -I changed his chemo to liposomal irinotecan and 5-FU pump infusion every 2 weeks on 08/07/2023 with dose reduction due to fatigue  -restaging CT 10/22/23 showed progression in lungs  -will change chemo to dose reduced FOLFOX starting 11/26/23  -He understands the goal of chemotherapy is palliative, to prolong his life. -- Improved side effects with reduced dose FOLFOX.

## 2023-12-10 NOTE — Telephone Encounter (Signed)
Pt called for pain medication refill, see associated orders.

## 2023-12-11 ENCOUNTER — Encounter: Payer: Self-pay | Admitting: Nurse Practitioner

## 2023-12-11 ENCOUNTER — Inpatient Hospital Stay: Payer: Medicare Other

## 2023-12-11 ENCOUNTER — Other Ambulatory Visit: Payer: Self-pay | Admitting: Hematology

## 2023-12-11 ENCOUNTER — Inpatient Hospital Stay: Payer: Medicare Other | Admitting: Nurse Practitioner

## 2023-12-11 ENCOUNTER — Other Ambulatory Visit: Payer: Self-pay

## 2023-12-11 ENCOUNTER — Inpatient Hospital Stay (HOSPITAL_BASED_OUTPATIENT_CLINIC_OR_DEPARTMENT_OTHER): Payer: Medicare Other | Admitting: Nurse Practitioner

## 2023-12-11 VITALS — BP 127/78 | HR 87 | Temp 97.4°F | Resp 14 | Wt 189.2 lb

## 2023-12-11 DIAGNOSIS — Z95828 Presence of other vascular implants and grafts: Secondary | ICD-10-CM

## 2023-12-11 DIAGNOSIS — Z515 Encounter for palliative care: Secondary | ICD-10-CM

## 2023-12-11 DIAGNOSIS — R530 Neoplastic (malignant) related fatigue: Secondary | ICD-10-CM

## 2023-12-11 DIAGNOSIS — C259 Malignant neoplasm of pancreas, unspecified: Secondary | ICD-10-CM

## 2023-12-11 DIAGNOSIS — K5903 Drug induced constipation: Secondary | ICD-10-CM

## 2023-12-11 DIAGNOSIS — G893 Neoplasm related pain (acute) (chronic): Secondary | ICD-10-CM

## 2023-12-11 DIAGNOSIS — E860 Dehydration: Secondary | ICD-10-CM

## 2023-12-11 DIAGNOSIS — Z5111 Encounter for antineoplastic chemotherapy: Secondary | ICD-10-CM | POA: Diagnosis not present

## 2023-12-11 LAB — CMP (CANCER CENTER ONLY)
ALT: 23 U/L (ref 0–44)
AST: 24 U/L (ref 15–41)
Albumin: 3.8 g/dL (ref 3.5–5.0)
Alkaline Phosphatase: 143 U/L — ABNORMAL HIGH (ref 38–126)
Anion gap: 7 (ref 5–15)
BUN: 15 mg/dL (ref 8–23)
CO2: 27 mmol/L (ref 22–32)
Calcium: 9.1 mg/dL (ref 8.9–10.3)
Chloride: 104 mmol/L (ref 98–111)
Creatinine: 0.85 mg/dL (ref 0.61–1.24)
GFR, Estimated: >60 mL/min (ref >60)
Glucose, Bld: 257 mg/dL — ABNORMAL HIGH (ref 70–99)
Potassium: 4 mmol/L (ref 3.5–5.1)
Sodium: 138 mmol/L (ref 135–145)
Total Bilirubin: 0.6 mg/dL (ref 0.0–1.2)
Total Protein: 6.6 g/dL (ref 6.5–8.1)

## 2023-12-11 LAB — CBC WITH DIFFERENTIAL (CANCER CENTER ONLY)
Abs Immature Granulocytes: 0.01 10*3/uL (ref 0.00–0.07)
Basophils Absolute: 0 10*3/uL (ref 0.0–0.1)
Basophils Relative: 1 %
Eosinophils Absolute: 0.3 10*3/uL (ref 0.0–0.5)
Eosinophils Relative: 6 %
HCT: 33.3 % — ABNORMAL LOW (ref 39.0–52.0)
Hemoglobin: 11.4 g/dL — ABNORMAL LOW (ref 13.0–17.0)
Immature Granulocytes: 0 %
Lymphocytes Relative: 16 %
Lymphs Abs: 0.7 10*3/uL (ref 0.7–4.0)
MCH: 32.6 pg (ref 26.0–34.0)
MCHC: 34.2 g/dL (ref 30.0–36.0)
MCV: 95.1 fL (ref 80.0–100.0)
Monocytes Absolute: 0.5 10*3/uL (ref 0.1–1.0)
Monocytes Relative: 11 %
Neutro Abs: 3.1 10*3/uL (ref 1.7–7.7)
Neutrophils Relative %: 66 %
Platelet Count: 95 10*3/uL — ABNORMAL LOW (ref 150–400)
RBC: 3.5 MIL/uL — ABNORMAL LOW (ref 4.22–5.81)
RDW: 12.6 % (ref 11.5–15.5)
WBC Count: 4.6 10*3/uL (ref 4.0–10.5)
nRBC: 0 % (ref 0.0–0.2)

## 2023-12-11 MED ORDER — PALONOSETRON HCL INJECTION 0.25 MG/5ML
0.2500 mg | Freq: Once | INTRAVENOUS | Status: AC
  Administered 2023-12-11: 0.25 mg via INTRAVENOUS
  Filled 2023-12-11: qty 5

## 2023-12-11 MED ORDER — OXALIPLATIN CHEMO INJECTION 100 MG/20ML
50.0000 mg/m2 | Freq: Once | INTRAVENOUS | Status: AC
  Administered 2023-12-11: 100 mg via INTRAVENOUS
  Filled 2023-12-11: qty 20

## 2023-12-11 MED ORDER — SODIUM CHLORIDE 0.9 % IV SOLN
1800.0000 mg/m2 | INTRAVENOUS | Status: DC
  Administered 2023-12-11: 3500 mg via INTRAVENOUS
  Filled 2023-12-11: qty 70

## 2023-12-11 MED ORDER — LEUCOVORIN CALCIUM INJECTION 350 MG
400.0000 mg/m2 | Freq: Once | INTRAVENOUS | Status: AC
  Administered 2023-12-11: 844 mg via INTRAVENOUS
  Filled 2023-12-11: qty 25

## 2023-12-11 MED ORDER — SODIUM CHLORIDE 0.9% FLUSH
10.0000 mL | Freq: Once | INTRAVENOUS | Status: AC
  Administered 2023-12-11: 10 mL

## 2023-12-11 MED ORDER — DEXAMETHASONE SODIUM PHOSPHATE 10 MG/ML IJ SOLN
10.0000 mg | Freq: Once | INTRAMUSCULAR | Status: AC
  Administered 2023-12-11: 10 mg via INTRAVENOUS
  Filled 2023-12-11: qty 1

## 2023-12-11 MED ORDER — DEXTROSE 5 % IV SOLN
INTRAVENOUS | Status: DC
Start: 2023-12-11 — End: 2023-12-11

## 2023-12-11 NOTE — Patient Instructions (Signed)

## 2023-12-13 ENCOUNTER — Inpatient Hospital Stay: Payer: Medicare Other

## 2023-12-13 VITALS — BP 131/81 | HR 98 | Temp 97.7°F | Resp 17

## 2023-12-13 DIAGNOSIS — Z5111 Encounter for antineoplastic chemotherapy: Secondary | ICD-10-CM | POA: Diagnosis not present

## 2023-12-13 DIAGNOSIS — C259 Malignant neoplasm of pancreas, unspecified: Secondary | ICD-10-CM

## 2023-12-13 MED ORDER — SODIUM CHLORIDE 0.9% FLUSH
10.0000 mL | INTRAVENOUS | Status: DC | PRN
  Administered 2023-12-13: 10 mL

## 2023-12-13 MED ORDER — HEPARIN SOD (PORK) LOCK FLUSH 100 UNIT/ML IV SOLN
500.0000 [IU] | Freq: Once | INTRAVENOUS | Status: AC | PRN
  Administered 2023-12-13: 500 [IU]

## 2023-12-13 NOTE — Patient Instructions (Signed)
Managing Chemotherapy Side Effects, Adult Chemotherapy is a treatment that uses medicine to kill cancer cells. However, in addition to killing cancer cells, the medicines can also damage healthy cells. The damage to healthy cells can lead to side effects. The exact side effects depend on the specific medicines used. Most of the side effects of chemotherapy go away once treatment is finished. Until then, work closely with your health care providers and take an active role in managing your side effects. What are common side effects of chemotherapy? Increased risk of infection, bruising, or bleeding. Nausea and vomiting. Constipation or diarrhea. Loss of appetite. Hair loss. Mouth or throat sores. Tiredness (fatigue). Tingling, pain, or numbness in the hands and feet. Dry, sensitive, itchy, or sore skin. Sleep disturbances, such as excessive sleepiness. Confusion, anxiety, or mood swings. Memory changes. How to manage the side effects of chemotherapy Medicines Take over-the-counter and prescription medicines only as told by your health care provider. Talk with your health care provider before taking vitamins, herbs, supplements, or over-the-counter medicines. Some of these can interfere with chemotherapy. Activity Get plenty of rest. Get regular exercise by doing activities such as walking, gentle yoga, or tai chi. Return to your normal activities as told by your health care provider. Ask your health care provider what activities are safe for you. Eating and drinking  Talk to a dietitian about what you should eat and drink during cancer treatment. Drink enough fluid to keep your urine pale yellow. If you have side effects that affect eating, these tips may help: Eat smaller meals and snacks often. Drink high-nutrition and high-calorie shakes or supplements. Choose bland and soft foods that are easy to eat. Do not eat foods that are hot, spicy, or hard to swallow. Do not eat raw or  undercooked meat, eggs, or seafood. Always wash fresh fruits and vegetables well before eating them. Skin care If you have sore or itchy skin: Wear soft, comfortable clothing. Apply creams and ointments to your skin as told by your health care provider. If you lose your hair, consider wearing a wig, hat, or scarf to cover your head. You may want to have someone shave your head as you start to lose hair. During outdoor activities, protect your head and skin from the sun by using sunscreen with an SPF of 30 or higher or by wearing protective clothing and a hat. Meet with a hair and skin care specialist for makeup and skin care tips. Apply sunscreen to your scalp as told by your health care provider. General tips Learn as much as you can about your condition. If you are struggling emotionally, talk with a mental health care provider or join a support group. Keep all follow-up visits. This is important. How to prevent infection and bleeding Chemotherapy may lower your blood counts and put you at risk for infection and bleeding. Here are some ways to help prevent problems. Vaccines Talk to your health care provider about vaccines. You should not get any live vaccines, such as the polio, MMR, chickenpox, and shingles vaccines until your health care provider says that it is safe to do so. Do not be around people who have had live vaccines for as long as your health care provider recommends. Make sure you get a yearly flu shot. People who will be near you should also get a yearly flu shot. Social activity Stay away from crowded places where you could be exposed to germs. Do not be around people who may be sick or  people who have symptoms of a fever until they have been fever-free for at least 24 hours. Do not share food, cups, straws, or utensils with other people. Wear a mask when outside the home if your blood counts are low. Cleanliness  Wash your hands often for at least 20 seconds. Also make  sure that other members of your household wash their hands often. Brush your teeth twice daily using a soft toothbrush. Use mouth rinse only as told by your health care provider. Take a bath or shower daily unless your health care provider gives different instructions. General tips Take your temperature regularly, especially if you have chills or feel warm. Check with your health care provider: Before you travel. Before you have a dental procedure. Before you use a swimming pool, hot tub, or swim in a lake or ocean. If you get chemotherapy through an IV or port, check the site every day for signs of infection. Check for redness, swelling, pain, fluid, and warmth. Avoid activities that put you at risk for injury or bleeding. Use an electric razor to shave instead of a blade. Questions to ask your health care provider What are the most common side effects of my treatment? How will they affect my daily life? What can I do to manage them? What are some possible long-term side effects? What are possible complications? What support services are available? What number can I call with questions or concerns? Where to find support Cancer affects the entire family. Find out what family support resources are available from your cancer treatment center. For more support, turn to: Your cancer care team. Friends and family. Your religious community. Other people with cancer. Community-based or online support groups. Where to find more information National Cancer Institute: www.cancer.gov American Cancer Society: www.cancer.org Contact a health care provider if: You bleed or bruise more often. You notice blood in your urine or stool. You have any of these symptoms: A skin rash, or dry or itchy skin. A headache or stiff neck. Cold or flu symptoms. A cough. Persistent nausea or vomiting. Persistent diarrhea. Frequent urination, burning when passing urine, or foul-smelling urine. You cannot eat  because of mouth or throat pain. You are sad, confused, anxious, or depressed. Get help right away if: You have any of these symptoms: A fever or chills. Your health care provider should know about this right away. Redness, swelling, pain, fluid, or warmth near an IV site. Bleeding that you cannot stop. A seizure. You cannot swallow. You have chest pain. You have trouble breathing. A family member or caregiver should get help right away if you have a sudden or unusual change in behavior. These symptoms may be an emergency. Get help right away. Call 911. Do not wait to see if the symptoms will go away. Do not drive yourself to the hospital. Summary Chemotherapy is a treatment that uses medicine to kill cancer cells and can cause side effects. The specific side effects depend on the specific medicines used. Learn as much as you can about your condition. Ask about side effects to watch for and how to treat them. Seek out support and resources from others. Find out what family support resources are available from your cancer treatment center. Let your health care provider know if you notice any new, unusual, or worsening symptoms, especially fever or chills. This information is not intended to replace advice given to you by your health care provider. Make sure you discuss any questions you have with your health  care provider. Document Revised: 10/25/2021 Document Reviewed: 10/25/2021 Elsevier Patient Education  2024 [employer].

## 2023-12-16 NOTE — Progress Notes (Unsigned)
Palliative Medicine [facility]  Telephone:(336) [phone] Fax:(336) [phone]   Name: Zachary Reid Date: 12/16/2023 MRN: [id number]  DOB: Aug 16, 1956  Patient Care Team: Lucky Cowboy, MD as PCP - General (Internal Medicine) Pollyann Samples, NP as PCP - Hematology/Oncology (Nurse Practitioner) Thomasene Ripple, DO as PCP - Cardiology (Cardiology) Nadara Mustard, MD as Consulting Physician (Orthopedic Surgery) Laurey Morale, MD as Consulting Physician (Cardiology) Malachy Mood, MD as Consulting Physician (Oncology) Pickenpack-Cousar, Arty Baumgartner, NP as Nurse Practitioner [facility])   I connected with Jearld Pies on 12/16/23 at  2:50 PM EST by phone and verified that I am speaking with the correct person using two identifiers.   I discussed the limitations, risks, security and privacy concerns of performing an evaluation and management service by telemedicine and the availability of in-person appointments. I also discussed with the patient that there may be a patient responsible charge related to this service. The patient expressed understanding and agreed to proceed.   Other persons participating in the visit and their role in the encounter: n/a   Patient's location: home  Provider's location: [facility]   Chief Complaint: f/u of symptom management    INTERVAL HISTORY: Zachary Reid is a 68 y.o. male with oncologic medical history including pancreatic adenocarcinoma (06/2021), HLD, diabetes, CKD, HTN, and anxiety. Palliative ask to see for symptom management and goals of care.   SOCIAL HISTORY:     reports that he has never smoked. He has never used smokeless tobacco. He reports that he does not drink alcohol and does not use drugs.  ADVANCE DIRECTIVES:  Advanced directives on file naming wife, Chrisangel Eskenazi, as patient healthcare power of attorney, and daughter, Delroy Ordway as Counsellor.   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 tobb;  newly dx ED 10-31-2022 w/ RVR  in setting flank pain (kidney stone)  --  office note in epic 11-07-2022 normal ETT 11-13-2022, echo 11-07-2022 ef 65-70% w/ mild LVH, mild MR,  zio monitor not completed,  started on toprol and xarelto daily   Personal history of chemotherapy    Receiving chemotherapy for metastatic pancreatic cancer. Most recent infusion (as of 12/02/22) was on 11/15/22.   Port-A-Cath in place 08/10/2021   Type 2 diabetes mellitus treated with insulin [facility])    endocrinologist--- dr Lucianne Muss   Vitamin D deficiency    takes Vit D supplements   Wears contact lenses     ALLERGIES:  is allergic to lipitor [atorvastatin].  MEDICATIONS:  Current Outpatient Medications  Medication Sig Dispense Refill   ALPRAZolam (XANAX) 0.5 MG tablet Take 1 tablet (0.5 mg total) by mouth 3 (three) times daily as needed for anxiety. 60 tablet 1   baclofen (LIORESAL) 10 MG tablet Take 1 tablet (10 mg total) by mouth 3 (three) times daily as needed  for muscle spasms. 30 each 2   Cholecalciferol (VITAMIN D3) 125 MCG (5000 UT) CAPS Take 5,000 Units by mouth in the morning and at bedtime.     Continuous Glucose Sensor (DEXCOM G7 SENSOR) MISC 1 Device by Does not apply route as directed. Change sensor every 10 days 3 each 3   diphenoxylate-atropine (LOMOTIL) 2.5-0.025 MG tablet Take 1-2 tablets by mouth 4 (four) times daily as  needed for diarrhea or loose stools. (Patient taking differently: Take 1-2 tablets by mouth 4 (four) times daily as needed for diarrhea or loose stools (as needed).) 30 tablet 0   gabapentin (NEURONTIN) 600 MG tablet Take 1 tablet (600 mg total) by mouth 3 (three) times daily as needed (pain). 90 tablet 0   glucose blood test strip Use as instructed 100 each 12   hyoscyamine (LEVSIN SL) 0.125 MG SL tablet DISSOLVE ONE TABLET UNDER THE TONGUE 4 TIMES DAILY UP TO EVERY 4 HOURS AS NEEDED FOR NAUSEA, BLOATING, CRAMPING, OR DIARRHEA 100 tablet 0   insulin degludec (TRESIBA FLEXTOUCH) 100 UNIT/ML FlexTouch Pen INJECT 32 UNITS INTO THE SKIN DAILY. 15 mL 4   insulin lispro (HUMALOG KWIKPEN) 100 UNIT/ML KwikPen 10 TO 15 UNITS BEFORE MEALS AS DIRECTED 15 mL 0   lipase/protease/amylase (CREON) 36000 UNITS CPEP capsule Take 1 capsule (36,000 Units total) by mouth 3 (three) times daily with meals. May also take 1 capsule (36,000 Units total) as needed (with snacks - up to 4 snacks daily). (Patient not taking: Reported on 12/11/2023) 150 capsule 11   metoprolol succinate (TOPROL-XL) 25 MG 24 hr tablet TAKE 1/2 TABLET BY MOUTH EVERY DAY 45 tablet 3   mirtazapine (REMERON) 7.5 MG tablet TAKE 1 TABLET BY MOUTH AT BEDTIME. (Patient not taking: Reported on 12/11/2023) 90 tablet 1   morphine (MS CONTIN) 15 MG 12 hr tablet Take 1 tablet (15 mg total) by mouth every 12 (twelve) hours. 60 tablet 0   ondansetron (ZOFRAN) 8 MG tablet Take 1 tablet (8 mg total) by mouth every 8 (eight) hours as needed for nausea or vomiting. (Patient taking differently: Take 8 mg by mouth every 8 (eight) hours as needed for nausea or vomiting (as needed).) 60 tablet 1   oxyCODONE (ROXICODONE) 5 MG immediate release tablet Take 1-2 tablets (5-10 mg total) by mouth every 4 (four) hours as needed for severe pain (pain score 7-10). 90 tablet 0   prochlorperazine (COMPAZINE) 10 MG tablet Take 1 tablet (10 mg total) by mouth every 6 (six) hours as needed  (Nausea or vomiting). (Patient taking differently: Take 10 mg by mouth every 6 (six) hours as needed for nausea or vomiting (as needed).) 60 tablet 1   rivaroxaban (XARELTO) 20 MG TABS tablet Take 1 tablet (20 mg total) by mouth daily with supper. (Patient not taking: Reported on 12/11/2023) 90 tablet 3   silver sulfADIAZINE (SILVADENE) 1 % cream Apply 1 Application topically daily. 20 g 0   zolpidem (AMBIEN) 10 MG tablet Take 1 tablet (10 mg total) by mouth at bedtime as needed. for sleep 60 tablet 0   No current facility-administered medications for this visit.    VITAL SIGNS: There were no vitals taken for this visit. There were no vitals filed for this visit.  Estimated body mass index is 26.39 kg/m as calculated from the following:   Height as of 10/01/23: 5\' 11"  (1.803 m).   Weight as of 12/11/23: 189 lb 3.2 oz (85.8 kg).   PERFORMANCE STATUS (ECOG) : 1 -  Symptomatic but completely ambulatory   Physical Exam General: NAD Cardiovascular: regular rate and rhythm Pulmonary: normal breathing pattern  Extremities: no edema, no joint deformities Skin: no rashes Neurological: alert and oriented x3  Discussed the use of AI scribe software for clinical note transcription with the patient, who gave verbal consent to proceed.   IMPRESSION:  I saw Mr. Kocsis during his infusion. No acute distress. Tolerating without difficulty. Denies nausea, vomiting, and diarrhea. He is managing his constipation with daily senna and Miralax. Appetite is fair. He is trying to remain as active as possible.  We discussed his pain at length. Reice reports a positive response to his current medication regimen, which includes morphine er and oxycodone. He describes taking oxycodone every four hours as needed when active and morphine er every twelve hours as prescribed. Tramadol has been discontinued. The patient notes a significant improvement in his pain levels since the adjustment of his medication, with  the highest pain level reported as a 7-8 out of 10, down from a previous 10. The lowest pain level is reported as a 3-4 out of 10.  Despite these improvements, the patient expresses a desire for further pain reduction, aiming for a more consistent and comfortable state. He expresses concern about reaching the maximum dosage of his medication and having no further options for pain management. Support provided. I discussed realistic expectations with understanding he may not gain complete pain resolution however if pain decreases to a manageable state of comfort this would be indicative of achievement. He verbalized understanding. We will continue on current regimen for another week with close evaluation. Will make adjustments as needed.   All questions answered and support provided.  Assessment and Plan  Cancer Related Pain Management Patient reports improvement in pain control with current regimen of morphine and oxycodone. Pain levels range from 3-4 at best to 7-8 at worst. This is an improvement from previous weeks where he was experiencing "severe" daily episodes. Discussed the goal of achieving a more steady, comfortable state, acknowledging that complete elimination of pain is unlikely. -Continue current regimen of morphine er 15mg  every 12 hours and oxycodone 5-10mg  every 4 hours as needed for breakthrough pain. -Plan to adjust morphine dosage if needed, in upcoming weeks.  -Telephone follow-up in one week to assess pain control and make medication adjustments if necessary.  -Constipation controlled with daily stool softeners.   I will plan to follow up in person in 2-3 weeks.  Patient expressed understanding and was in agreement with this plan. He also understands that He can call the clinic at any time with any questions, concerns, or complaints.   Any controlled substances utilized were prescribed in the context of palliative care. PDMP has been reviewed.   Visit consisted of  counseling and education dealing with the complex and emotionally intense issues of symptom management and palliative care in the setting of serious and potentially life-threatening illness.  Willette Alma, AGPCNP-BC  Palliative Medicine Team/[facility]

## 2023-12-17 ENCOUNTER — Telehealth: Payer: Self-pay

## 2023-12-17 NOTE — Telephone Encounter (Signed)
Called pt back after he left a voicemail, no answer, provided a callback number and LVM.

## 2023-12-17 NOTE — Telephone Encounter (Signed)
Education provided on how to prevent colds/flu, no further needs at this time.

## 2023-12-18 ENCOUNTER — Inpatient Hospital Stay: Payer: Medicare Other | Admitting: Nurse Practitioner

## 2023-12-18 ENCOUNTER — Encounter: Payer: Self-pay | Admitting: Nurse Practitioner

## 2023-12-18 DIAGNOSIS — K5903 Drug induced constipation: Secondary | ICD-10-CM

## 2023-12-18 DIAGNOSIS — G893 Neoplasm related pain (acute) (chronic): Secondary | ICD-10-CM | POA: Diagnosis not present

## 2023-12-18 DIAGNOSIS — C259 Malignant neoplasm of pancreas, unspecified: Secondary | ICD-10-CM | POA: Diagnosis not present

## 2023-12-18 DIAGNOSIS — Z515 Encounter for palliative care: Secondary | ICD-10-CM

## 2023-12-19 ENCOUNTER — Other Ambulatory Visit: Payer: Self-pay

## 2023-12-19 DIAGNOSIS — C259 Malignant neoplasm of pancreas, unspecified: Secondary | ICD-10-CM

## 2023-12-19 MED ORDER — OXYCODONE HCL 5 MG PO TABS: 5.0000 mg | ORAL_TABLET | ORAL | 0 refills | Status: DC | PRN

## 2023-12-19 NOTE — Telephone Encounter (Signed)
 Pt called for refill, see associated orders.

## 2023-12-24 NOTE — Assessment & Plan Note (Signed)
 [id number] with numerous small cavitary lesions in bilateral lungs, MMR normal  -diagnosed 07/2021, after incidental finding on CT scan for kidney stone, by colonoscopy/EUS. -Baseline CA 19-9 elevated to 273 on 07/03/21 -He began first-line mFOLFIRINOX on 08/13/21, but required dose reduction and move to every 3 weeks. -his CA 19-9 trended up but is now stable. -restaging CT CAP 08/12/22 showed: multiple new and enlarging pulmonary metastases; pancreatic mass grossly stable. Will repeat in 3 months. -we changed his irinotecan to liposomal on 09/05/22. He tolerated better and feels stronger. -Due to disease progression, his treatment was changed to gemcitabine and Abraxane every 2 weeks on 01/02/2023 -He tolerated the first two cycles chemotherapy very well with mild fatigue.  I increased gemcitabine to 1000mg /m2 from cycle 3 -Restaging CT scan 03/12/2023 showed stable disease overall. His primary pancreatic tumor is slightly increased in size, however his multiple cavitary lung nodules are slightly improved with thinner walls now.   -will continue current therapy.  He is overall tolerating well, but he has developed worsening fatigue lately.  I will reduce gemcitabine dose to 800 mg/m again. -His tumor marker CA 19.9 continues trending down, which is a good clinical sign. -restaging CT 07/22/2023 showed moderate disease progression at the primary site and lung mets, I personally reviewed the scan images with patient and his wife in detail. -I changed his chemo to liposomal irinotecan and 5-FU pump infusion every 2 weeks on 08/07/2023 with dose reduction due to fatigue  -restaging CT 10/22/23 showed progression in lungs  -will change chemo to dose reduced FOLFOX starting 11/26/23  -He understands the goal of chemotherapy is palliative, to prolong his life. -- Improved side effects with reduced dose FOLFOX.

## 2023-12-25 ENCOUNTER — Inpatient Hospital Stay: Payer: Medicare Other | Admitting: Hematology

## 2023-12-25 ENCOUNTER — Inpatient Hospital Stay: Payer: Medicare Other

## 2023-12-25 ENCOUNTER — Encounter: Payer: Self-pay | Admitting: Hematology

## 2023-12-25 ENCOUNTER — Inpatient Hospital Stay: Payer: Medicare Other | Attending: Hematology

## 2023-12-25 VITALS — BP 126/81 | HR 100 | Temp 97.5°F | Resp 17 | Wt 190.8 lb

## 2023-12-25 DIAGNOSIS — I8500 Esophageal varices without bleeding: Secondary | ICD-10-CM | POA: Insufficient documentation

## 2023-12-25 DIAGNOSIS — I2510 Atherosclerotic heart disease of native coronary artery without angina pectoris: Secondary | ICD-10-CM | POA: Diagnosis not present

## 2023-12-25 DIAGNOSIS — C251 Malignant neoplasm of body of pancreas: Secondary | ICD-10-CM | POA: Insufficient documentation

## 2023-12-25 DIAGNOSIS — E559 Vitamin D deficiency, unspecified: Secondary | ICD-10-CM | POA: Insufficient documentation

## 2023-12-25 DIAGNOSIS — E785 Hyperlipidemia, unspecified: Secondary | ICD-10-CM | POA: Diagnosis not present

## 2023-12-25 DIAGNOSIS — I700 Atherosclerosis of aorta: Secondary | ICD-10-CM | POA: Insufficient documentation

## 2023-12-25 DIAGNOSIS — K7689 Other specified diseases of liver: Secondary | ICD-10-CM | POA: Insufficient documentation

## 2023-12-25 DIAGNOSIS — E119 Type 2 diabetes mellitus without complications: Secondary | ICD-10-CM | POA: Diagnosis not present

## 2023-12-25 DIAGNOSIS — C259 Malignant neoplasm of pancreas, unspecified: Secondary | ICD-10-CM | POA: Diagnosis not present

## 2023-12-25 DIAGNOSIS — Z87442 Personal history of urinary calculi: Secondary | ICD-10-CM | POA: Insufficient documentation

## 2023-12-25 DIAGNOSIS — I82891 Chronic embolism and thrombosis of other specified veins: Secondary | ICD-10-CM | POA: Diagnosis not present

## 2023-12-25 DIAGNOSIS — Z794 Long term (current) use of insulin: Secondary | ICD-10-CM | POA: Diagnosis not present

## 2023-12-25 DIAGNOSIS — Z860101 Personal history of adenomatous and serrated colon polyps: Secondary | ICD-10-CM | POA: Insufficient documentation

## 2023-12-25 DIAGNOSIS — I480 Paroxysmal atrial fibrillation: Secondary | ICD-10-CM | POA: Diagnosis not present

## 2023-12-25 DIAGNOSIS — Z5111 Encounter for antineoplastic chemotherapy: Secondary | ICD-10-CM | POA: Insufficient documentation

## 2023-12-25 DIAGNOSIS — E860 Dehydration: Secondary | ICD-10-CM

## 2023-12-25 DIAGNOSIS — Z7901 Long term (current) use of anticoagulants: Secondary | ICD-10-CM | POA: Insufficient documentation

## 2023-12-25 DIAGNOSIS — G4700 Insomnia, unspecified: Secondary | ICD-10-CM | POA: Insufficient documentation

## 2023-12-25 DIAGNOSIS — Z79899 Other long term (current) drug therapy: Secondary | ICD-10-CM | POA: Insufficient documentation

## 2023-12-25 DIAGNOSIS — R5383 Other fatigue: Secondary | ICD-10-CM | POA: Diagnosis not present

## 2023-12-25 DIAGNOSIS — N132 Hydronephrosis with renal and ureteral calculous obstruction: Secondary | ICD-10-CM | POA: Insufficient documentation

## 2023-12-25 DIAGNOSIS — Z95828 Presence of other vascular implants and grafts: Secondary | ICD-10-CM

## 2023-12-25 DIAGNOSIS — C7800 Secondary malignant neoplasm of unspecified lung: Secondary | ICD-10-CM | POA: Diagnosis not present

## 2023-12-25 LAB — CBC WITH DIFFERENTIAL (CANCER CENTER ONLY)
Abs Immature Granulocytes: 0.02 10*3/uL (ref 0.00–0.07)
Basophils Absolute: 0 10*3/uL (ref 0.0–0.1)
Basophils Relative: 1 %
Eosinophils Absolute: 0.2 10*3/uL (ref 0.0–0.5)
Eosinophils Relative: 3 %
HCT: 32.3 % — ABNORMAL LOW (ref 39.0–52.0)
Hemoglobin: 11 g/dL — ABNORMAL LOW (ref 13.0–17.0)
Immature Granulocytes: 0 %
Lymphocytes Relative: 12 %
Lymphs Abs: 0.7 10*3/uL (ref 0.7–4.0)
MCH: 32.5 pg (ref 26.0–34.0)
MCHC: 34.1 g/dL (ref 30.0–36.0)
MCV: 95.6 fL (ref 80.0–100.0)
Monocytes Absolute: 0.7 10*3/uL (ref 0.1–1.0)
Monocytes Relative: 11 %
Neutro Abs: 4.5 10*3/uL (ref 1.7–7.7)
Neutrophils Relative %: 73 %
Platelet Count: 83 10*3/uL — ABNORMAL LOW (ref 150–400)
RBC: 3.38 MIL/uL — ABNORMAL LOW (ref 4.22–5.81)
RDW: 12.7 % (ref 11.5–15.5)
WBC Count: 6.1 10*3/uL (ref 4.0–10.5)
nRBC: 0 % (ref 0.0–0.2)

## 2023-12-25 LAB — CMP (CANCER CENTER ONLY)
ALT: 19 U/L (ref 0–44)
AST: 28 U/L (ref 15–41)
Albumin: 3.7 g/dL (ref 3.5–5.0)
Alkaline Phosphatase: 142 U/L — ABNORMAL HIGH (ref 38–126)
Anion gap: 5 (ref 5–15)
BUN: 15 mg/dL (ref 8–23)
CO2: 27 mmol/L (ref 22–32)
Calcium: 9 mg/dL (ref 8.9–10.3)
Chloride: 109 mmol/L (ref 98–111)
Creatinine: 0.79 mg/dL (ref 0.61–1.24)
GFR, Estimated: >60 mL/min (ref >60)
Glucose, Bld: 170 mg/dL — ABNORMAL HIGH (ref 70–99)
Potassium: 3.7 mmol/L (ref 3.5–5.1)
Sodium: 141 mmol/L (ref 135–145)
Total Bilirubin: 0.7 mg/dL (ref 0.0–1.2)
Total Protein: 6.7 g/dL (ref 6.5–8.1)

## 2023-12-25 MED ORDER — PALONOSETRON HCL INJECTION 0.25 MG/5ML
0.2500 mg | Freq: Once | INTRAVENOUS | Status: AC
  Administered 2023-12-25: 0.25 mg via INTRAVENOUS
  Filled 2023-12-25: qty 5

## 2023-12-25 MED ORDER — SODIUM CHLORIDE 0.9% FLUSH
10.0000 mL | Freq: Once | INTRAVENOUS | Status: AC
  Administered 2023-12-25: 10 mL

## 2023-12-25 MED ORDER — OXALIPLATIN CHEMO INJECTION 100 MG/20ML
50.0000 mg/m2 | Freq: Once | INTRAVENOUS | Status: AC
  Administered 2023-12-25: 100 mg via INTRAVENOUS
  Filled 2023-12-25: qty 20

## 2023-12-25 MED ORDER — DEXAMETHASONE SODIUM PHOSPHATE 10 MG/ML IJ SOLN
10.0000 mg | Freq: Once | INTRAMUSCULAR | Status: AC
  Administered 2023-12-25: 10 mg via INTRAVENOUS
  Filled 2023-12-25: qty 1

## 2023-12-25 MED ORDER — SODIUM CHLORIDE 0.9 % IV SOLN
1800.0000 mg/m2 | INTRAVENOUS | Status: DC
  Administered 2023-12-25: 3500 mg via INTRAVENOUS
  Filled 2023-12-25: qty 70

## 2023-12-25 MED ORDER — DEXTROSE 5 % IV SOLN: INTRAVENOUS | Status: DC

## 2023-12-25 MED ORDER — DEXTROSE 5 % IV SOLN
400.0000 mg/m2 | Freq: Once | INTRAVENOUS | Status: AC
  Administered 2023-12-25: 844 mg via INTRAVENOUS
  Filled 2023-12-25: qty 25

## 2023-12-25 NOTE — Patient Instructions (Signed)
 [facility] - A DEPT OF [facility]  Discharge Instructions: Thank you for choosing [facility] to provide your oncology and hematology care.   If you have a lab appointment with the Cancer Center, please go directly to the Cancer Center and check in at the registration area.   Wear comfortable clothing and clothing appropriate for easy access to any Portacath or PICC line.   We strive to give you quality time with your provider. You may need to reschedule your appointment if you arrive late (15 or more minutes).  Arriving late affects you and other patients whose appointments are after yours.  Also, if you miss three or more appointments without notifying the office, you may be dismissed from the clinic at the provider's discretion.      For prescription refill requests, have your pharmacy contact our office and allow 72 hours for refills to be completed.    Today you received the following chemotherapy and/or immunotherapy agents: Oxaliplatin, Leucovorin, Fluorouracil.       To help prevent nausea and vomiting after your treatment, we encourage you to take your nausea medication as directed.  BELOW ARE SYMPTOMS THAT SHOULD BE REPORTED IMMEDIATELY: *FEVER GREATER THAN 100.4 F (38 C) OR HIGHER *CHILLS OR SWEATING *NAUSEA AND VOMITING THAT IS NOT CONTROLLED WITH YOUR NAUSEA MEDICATION *UNUSUAL SHORTNESS OF BREATH *UNUSUAL BRUISING OR BLEEDING *URINARY PROBLEMS (pain or burning when urinating, or frequent urination) *BOWEL PROBLEMS (unusual diarrhea, constipation, pain near the anus) TENDERNESS IN MOUTH AND THROAT WITH OR WITHOUT PRESENCE OF ULCERS (sore throat, sores in mouth, or a toothache) UNUSUAL RASH, SWELLING OR PAIN  UNUSUAL VAGINAL DISCHARGE OR ITCHING   Items with * indicate a potential emergency and should be followed up as soon as possible or go to the Emergency Department if any problems should occur.  Please show the  CHEMOTHERAPY ALERT CARD or IMMUNOTHERAPY ALERT CARD at check-in to the Emergency Department and triage nurse.  Should you have questions after your visit or need to cancel or reschedule your appointment, please contact [facility] - A DEPT OF [facility]  Dept: [phone]  and follow the prompts.  Office hours are 8:00 a.m. to 4:30 p.m. Monday - Friday. Please note that voicemails left after 4:00 p.m. may not be returned until the following business day.  We are closed weekends and major holidays. You have access to a nurse at all times for urgent questions. Please call the main number to the clinic Dept: [phone] and follow the prompts.   For any non-urgent questions, you may also contact your provider using MyChart. We now offer e-Visits for anyone 36 and older to request care online for non-urgent symptoms. For details visit [email or web address].   Also download the MyChart app! Go to the app store, search "MyChart", open the app, select [place], and log in with your MyChart username and password.

## 2023-12-25 NOTE — Progress Notes (Signed)
 [facility]   Telephone:([phone]) [phone] Fax:([phone]   Clinic Follow up Note   Patient Care Team: Tonita Fallow, MD as PCP - General (Internal Medicine) Burton, Lacie K, NP as PCP - Hematology/Oncology (Nurse Practitioner) Sheena Pugh, DO as PCP - Cardiology (Cardiology) Harden Jerona GAILS, MD as Consulting Physician (Orthopedic Surgery) Rolan Ezra RAMAN, MD as Consulting Physician (Cardiology) Lanny Callander, MD as Consulting Physician (Oncology) Pickenpack-Cousar, Fannie SAILOR, NP as Nurse Practitioner [facility] and Palliative Medicine)  Date of Service:  12/25/2023  CHIEF COMPLAINT: f/u of pancreatic cancer  CURRENT THERAPY:  FOLFOX  Oncology History   Pancreatic adenocarcinoma [facility]) [id number] with numerous small cavitary lesions in bilateral lungs, MMR normal  -diagnosed 07/2021, after incidental finding on CT scan for kidney stone, by colonoscopy/EUS. -Baseline CA 19-9 elevated to 273 on 07/03/21 -He began first-line mFOLFIRINOX on 08/13/21, but required dose reduction and move to every 3 weeks. -his CA 19-9 trended up but is now stable. -restaging CT CAP 08/12/22 showed: multiple new and enlarging pulmonary metastases; pancreatic mass grossly stable. Will repeat in 3 months. -we changed his irinotecan  to liposomal on 09/05/22. He tolerated better and feels stronger. -Due to disease progression, his treatment was changed to gemcitabine  and Abraxane  every 2 weeks on 01/02/2023 -He tolerated the first two cycles chemotherapy very well with mild fatigue.  I increased gemcitabine  to 1000mg /m2 from cycle 3 -Restaging CT scan 03/12/2023 showed stable disease overall. His primary pancreatic tumor is slightly increased in size, however his multiple cavitary lung nodules are slightly improved with thinner walls now.   -will continue current therapy.  He is overall tolerating well, but he has developed worsening fatigue lately.  I will reduce gemcitabine  dose to 800 mg/m again. -His  tumor marker CA 19.9 continues trending down, which is a good clinical sign. -restaging CT 07/22/2023 showed moderate disease progression at the primary site and lung mets, I personally reviewed the scan images with patient and his wife in detail. -I changed his chemo to liposomal irinotecan  and 5-FU pump infusion every 2 weeks on 08/07/2023 with dose reduction due to fatigue  -restaging CT 10/22/23 showed progression in lungs  -will change chemo to dose reduced FOLFOX starting 11/26/23  -He understands the goal of chemotherapy is palliative, to prolong his life    Assessment and Plan    Metastatic Pancreatic Cancer Follow-up for metastatic pancreatic cancer. He has completed two cycles of chemotherapy with minimal side effects, including cold sensitivity and numbness. CA 19-9 tumor marker was very high a month ago; another draw is needed to monitor progress. He is experiencing increased pain, managed with slow-release morphine  and oxycodone . Discussed the importance of monitoring tumor markers and the potential need for 4-6 cycles of chemotherapy to see significant changes. Informed about the potential side effects of chemotherapy and the importance of pain management. - Order CA 19-9 tumor marker test - Continue chemotherapy as scheduled - Monitor and adjust pain management as needed - Order a scan for the next visit  Constipation Severe constipation and impaction, likely secondary to opioid use (slow-release morphine ). Over-the-counter stool softeners and suppositories have had limited success. The constipation is severe, with hard stools requiring manual disimpaction. Discussed the use of Miralax and magnesium citrate as rescue treatments. Informed about the potential need for prescription medication if over-the-counter treatments are ineffective. - Increase Miralax dosage to 2-3 scoops per day - Consider magnesium citrate as a rescue treatment if no bowel movement - If constipation persists,  call next  week for a prescription medication to manage opioid-induced constipation  General Health Maintenance Discussion about flu vaccination. He declined the flu shot due to concerns about feeling unwell post-vaccination. Advised on the importance of flu prevention, especially given his immunocompromised state due to chemotherapy. Informed about the availability of over-the-counter flu and COVID test kits for early detection and treatment. - Consider flu vaccination in the future - Purchase over-the-counter flu and COVID test kits for early detection and treatment  Plan -Lab reviewed, adequate for treatment, will proceed chemotherapy C3 today -Follow-up in 2 weeks before next cycle chemo -Plan to repeat CT scan after cycle 5       SUMMARY OF ONCOLOGIC HISTORY: Oncology History Overview Note   Cancer Staging  Pancreatic adenocarcinoma [facility]) Staging form: Exocrine Pancreas, AJCC 8th Edition - Clinical stage from 07/26/2021: Stage IV (cT4, cN0, cM1) - Signed by Lanny Callander, MD on 07/28/2021    Pancreatic adenocarcinoma (HCC)  07/02/2021 Initial Diagnosis   Pancreatic adenocarcinoma (HCC)   07/26/2021 Cancer Staging   Staging form: Exocrine Pancreas, AJCC 8th Edition - Clinical stage from 07/26/2021: Stage IV (cT4, cN0, cM1) - Signed by Lanny Callander, MD on 07/28/2021 Stage prefix: Initial diagnosis Total positive nodes: 0   08/13/2021 - 07/06/2022 Chemotherapy   Patient is on Treatment Plan : PANCREAS Modified FOLFIRINOX q14d x 4 cycles      Genetic Testing   [facility] CancerNext-Expanded results (77 genes) were negative. No pathogenic variants were identified. 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 The report date is 08/15/2021.    The CancerNext-Expanded gene panel offered by [facility] and includes sequencing, rearrangement, and RNA analysis for the following 77 genes: AIP, ALK, APC, ATM, AXIN2, BAP1, BARD1, BLM, BMPR1A, BRCA1, BRCA2, BRIP1, CDC73, CDH1,  CDK4, CDKN1B, CDKN2A, CHEK2, CTNNA1, DICER1, FANCC, FH, FLCN, GALNT12, KIF1B, LZTR1, MAX, MEN1, MET, MLH1, MSH2, MSH3, MSH6, MUTYH, NBN, NF1, NF2, NTHL1, PALB2, PHOX2B, PMS2, POT1, PRKAR1A, PTCH1, PTEN, RAD51C, RAD51D, RB1, RECQL, RET, SDHA, SDHAF2, SDHB, SDHC, SDHD, SMAD4, SMARCA4, SMARCB1, SMARCE1, STK11, SUFU, TMEM127, TP53, TSC1, TSC2, VHL and XRCC2 (sequencing and deletion/duplication); EGFR, EGLN1, HOXB13, KIT, MITF, PDGFRA, POLD1, and POLE (sequencing only); EPCAM and GREM1 (deletion/duplication only).     08/13/2021 - 12/06/2022 Chemotherapy   Patient is on Treatment Plan : PANCREAS Modified FOLFIRINOX q14d x 4 cycles     10/25/2021 Imaging   EXAM: CT CHEST, ABDOMEN, AND PELVIS WITH CONTRAST  IMPRESSION: 1. Innumerable bilateral pulmonary nodules, many of which are cavitary, consistent with metastatic disease. These nodules show no substantial change in are minimally progressed in the interval. 2. Mix cystic and solid lesion in the head and body of the pancreas is similar to prior and also comparing back to MRI 07/02/2021. 3. Hepatic cysts. 4. 8 mm nonobstructing left renal stone. 5. Aortic Atherosclerosis (ICD10-I70.0).   01/28/2022 Imaging   EXAM: CT CHEST, ABDOMEN, AND PELVIS WITH CONTRAST  IMPRESSION: 1. Innumerable bilateral small solid and cavitary pulmonary nodules, some of the cavitary nodules are slightly less thick-walled when compared with prior exam. 2. Mixed cystic and solid lesion in the head and body of the pancreas is similar to prior exam. 3. Nonobstructing left renal stone. 4.  Aortic Atherosclerosis (ICD10-I70.0).   11/29/2022 Imaging    IMPRESSION: 1. No significant change in primary pancreatic mass and adjacent cystic component. Unchanged appearance of soft tissue extending to involve the adjacent celiac axis, superior mesenteric artery origin, and portal confluence. 2. 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 3. Innumerable small pulmonary nodules throughout the lungs, the majority of which are cavitary. Although interval change is difficult to appreciate for the majority of these nodules due to small size, at least some of these are slightly enlarged, consistent with worsened pulmonary metastatic disease. 4. Newly enlarged left retroperitoneal lymph nodes, which may reflect nodal metastatic disease or perhaps reactive to left hydronephrosis. Attention on follow-up. 5. A sizable calculus previously seen in the left renal pelvis has migrated to the middle third of the left ureter, with placement of a double-J left ureteral stent, with formed pigtails in the left renal pelvis and urinary bladder. Moderate left hydronephrosis and proximal hydroureter. 6. Coronary artery disease.   Aortic Atherosclerosis (ICD10-I70.0).   01/02/2023 - 06/26/2023 Chemotherapy   Patient is on Treatment Plan : PANCREATIC Abraxane  D1,8,15 + Gemcitabine  D1,8,15 q28d     03/12/2023 Imaging    IMPRESSION: 1. Primary pancreatic mass with adjacent cystic component is slightly increased in size when compared with the prior exam. Similar soft tissue extending involve the celiac axis superior mesenteric artery origin and portal confluence. 2. Innumerable bilateral pulmonary nodules, some of which are cavitary, unchanged when compared with the prior. 3. Left retroperitoneal lymph nodes are decreased in size, likely resolving reactive adenopathy 4. Interval removal of left ureter stent.  No hydronephrosis. 5. coronary artery disease and aortic Atherosclerosis (ICD10-I70.0).   07/22/2023 Imaging   CT CAP W CONTRAST   IMPRESSION: 1. Today's study demonstrates progression of disease as evidenced by enlargement of the primary pancreatic mass, mild enlargement of numerous prominent borderline enlarged and mildly enlarged upper abdominal and retroperitoneal lymph nodes, and increased number  and size of numerous metastatic nodules scattered throughout the lungs bilaterally, as detailed above. 2. 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 Main portal vein remains patent at this time. 3. 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 4. Nonobstructive nephrolithiasis in the kidneys bilaterally measuring 2-4 mm in size. 5. Aortic atherosclerosis, in addition to left main and three-vessel coronary artery disease. 6. Additional incidental findings, as above.   08/07/2023 - 10/24/2023 Chemotherapy   Patient is on Treatment Plan : PANCREAS Liposomal Irinotecan  + Leucovorin  + 5-FU IVCI q14d     11/26/2023 -  Chemotherapy   Patient is on Treatment Plan : PANCREAS FOLFOX q14d        Discussed the use of AI scribe software for clinical note transcription with the patient, who gave verbal consent to proceed.  History of Present Illness   The patient, a 68 year old with metastatic pancreatic cancer, presents with severe constipation and pain. He reports a significant increase in constipation over the past week, describing his stools as 'rock hard and the size of stones.' Despite taking over-the-counter stool softeners and suppositories, he has not experienced significant relief. He has also been dealing with hemorrhoids, which have added to his discomfort.  In addition to constipation, the patient is experiencing pain. He has been taking a slow-release morphine , which has provided some relief. However, he woke up with pain this morning, which is unusual as he typically does not experience pain until later in the day. He took his prescribed oxycodone , but the pain has persisted.  The patient is currently undergoing chemotherapy for his pancreatic cancer. He has completed two cycles of chemotherapy since his last visit and is due to start his  third cycle today. He  reports tolerating the chemotherapy well, with no significant cold sensitivity or numbness/tingling.         All other systems were reviewed with the patient and are negative.  MEDICAL HISTORY:  Past Medical History:  Diagnosis Date   Adenocarcinoma of pancreas, stage 4 (HCC) 07/2021   oncologist--- dr lanny;   mets to  bilateral lung;  started chemo 08-13-2021   Anemia    GAD (generalized anxiety disorder)    History of adenomatous polyp of colon    Hyperlipidemia    Hypogonadism male    IBS (irritable bowel syndrome)    Insomnia    Left ureteral calculus    Metastatic cancer to lung (HCC) 07/2021   primary pancreatic cancer w/ numerous small cavity lesions bilateral lungs   PAF (paroxysmal atrial fibrillation) (HCC) 10/31/2022   cardiologist-- dr marla. tobb;  newly dx ED 10-31-2022 w/ RVR  in setting flank pain (kidney stone)  --  office note in epic 11-07-2022 normal ETT 11-13-2022, echo 11-07-2022 ef 65-70% w/ mild LVH, mild MR,  zio monitor not completed,  started on toprol  and xarelto  daily   Personal history of chemotherapy    Receiving chemotherapy for metastatic pancreatic cancer. Most recent infusion (as of 12/02/22) was on 11/15/22.   Port-A-Cath in place 08/10/2021   Type 2 diabetes mellitus treated with insulin  [facility])    endocrinologist--- dr von   Vitamin D  deficiency    takes Vit D supplements   Wears contact lenses     SURGICAL HISTORY: Past Surgical History:  Procedure Laterality Date   APPENDECTOMY  1988   BIOPSY  07/26/2021   Procedure: BIOPSY;  Surgeon: Wilhelmenia Aloha Raddle., MD;  Location: [facility] ENDOSCOPY;  Service: Gastroenterology;;   COLONOSCOPY WITH PROPOFOL  N/A 07/26/2021   Procedure: COLONOSCOPY WITH PROPOFOL ;  Surgeon: Wilhelmenia Aloha Raddle., MD;  Location: [facility] ENDOSCOPY;  Service: Gastroenterology;  Laterality: N/A;   CYSTOSCOPY WITH STENT PLACEMENT Left 10/31/2022   Procedure: CYSTOSCOPY WITH STENT PLACEMENT;  Surgeon: Rosalind Zachary NOVAK, MD;   Location: [facility] ORS;  Service: Urology;  Laterality: Left;   CYSTOSCOPY/URETEROSCOPY/HOLMIUM LASER/STENT PLACEMENT Left 12/03/2022   Procedure: CYSTOSCOPY/LEFT URETEROSCOPY/HOLMIUM LASER/STENT EXCHANGE;  Surgeon: Rosalind Zachary NOVAK, MD;  Location: [facility];  Service: Urology;  Laterality: Left;  1 HR FOR CASE   ESOPHAGOGASTRODUODENOSCOPY (EGD) WITH PROPOFOL  N/A 07/26/2021   Procedure: ESOPHAGOGASTRODUODENOSCOPY (EGD) WITH PROPOFOL ;  Surgeon: Wilhelmenia Aloha Raddle., MD;  Location: [facility] ENDOSCOPY;  Service: Gastroenterology;  Laterality: N/A;   EUS N/A 07/26/2021   Procedure: UPPER ENDOSCOPIC ULTRASOUND (EUS) RADIAL;  Surgeon: Wilhelmenia Aloha Raddle., MD;  Location: [facility] ENDOSCOPY;  Service: Gastroenterology;  Laterality: N/A;   FINE NEEDLE ASPIRATION  07/26/2021   Procedure: FINE NEEDLE ASPIRATION (FNA) LINEAR;  Surgeon: Wilhelmenia Aloha Raddle., MD;  Location: [facility] ENDOSCOPY;  Service: Gastroenterology;;   IR IMAGING GUIDED PORT INSERTION  08/10/2021   POLYPECTOMY  07/26/2021   Procedure: POLYPECTOMY;  Surgeon: Wilhelmenia Aloha Raddle., MD;  Location: [facility] ENDOSCOPY;  Service: Gastroenterology;;   SHOULDER SURGERY Right    as a teenager    I have reviewed the social history and family history with the patient and they are unchanged from previous note.  ALLERGIES:  is allergic to lipitor [atorvastatin].  MEDICATIONS:  Current Outpatient Medications  Medication Sig Dispense Refill   ALPRAZolam  (XANAX ) 0.5 MG tablet Take 1 tablet (0.5 mg total) by mouth 3 (three) times daily as needed for anxiety. 60 tablet 1   baclofen  (LIORESAL ) 10  MG tablet Take 1 tablet (10 mg total) by mouth 3 (three) times daily as needed for muscle spasms. 30 each 2   Cholecalciferol (VITAMIN D3) 125 MCG (5000 UT) CAPS Take 5,000 Units by mouth in the morning and at bedtime.     Continuous Glucose Sensor (DEXCOM G7 SENSOR) MISC 1 Device by Does not apply route as directed. Change sensor every 10 days 3 each 3    diphenoxylate -atropine  (LOMOTIL ) 2.5-0.025 MG tablet Take 1-2 tablets by mouth 4 (four) times daily as needed for diarrhea or loose stools. (Patient taking differently: Take 1-2 tablets by mouth 4 (four) times daily as needed for diarrhea or loose stools (as needed).) 30 tablet 0   gabapentin  (NEURONTIN ) 600 MG tablet Take 1 tablet (600 mg total) by mouth 3 (three) times daily as needed (pain). 90 tablet 0   glucose blood test strip Use as instructed 100 each 12   hyoscyamine  (LEVSIN  SL) 0.125 MG SL tablet DISSOLVE ONE TABLET UNDER THE TONGUE 4 TIMES DAILY UP TO EVERY 4 HOURS AS NEEDED FOR NAUSEA, BLOATING, CRAMPING, OR DIARRHEA 100 tablet 0   insulin  degludec (TRESIBA  FLEXTOUCH) 100 UNIT/ML FlexTouch Pen INJECT 32 UNITS INTO THE SKIN DAILY. 15 mL 4   insulin  lispro (HUMALOG  KWIKPEN) 100 UNIT/ML KwikPen 10 TO 15 UNITS BEFORE MEALS AS DIRECTED 15 mL 0   lipase/protease/amylase (CREON ) 36000 UNITS CPEP capsule Take 1 capsule (36,000 Units total) by mouth 3 (three) times daily with meals. May also take 1 capsule (36,000 Units total) as needed (with snacks - up to 4 snacks daily). (Patient not taking: Reported on 12/11/2023) 150 capsule 11   metoprolol  succinate (TOPROL -XL) 25 MG 24 hr tablet TAKE 1/2 TABLET BY MOUTH EVERY DAY 45 tablet 3   mirtazapine  (REMERON ) 7.5 MG tablet TAKE 1 TABLET BY MOUTH AT BEDTIME. (Patient not taking: Reported on 12/11/2023) 90 tablet 1   morphine  (MS CONTIN ) 15 MG 12 hr tablet Take 1 tablet (15 mg total) by mouth every 12 (twelve) hours. 60 tablet 0   ondansetron  (ZOFRAN ) 8 MG tablet Take 1 tablet (8 mg total) by mouth every 8 (eight) hours as needed for nausea or vomiting. (Patient taking differently: Take 8 mg by mouth every 8 (eight) hours as needed for nausea or vomiting (as needed).) 60 tablet 1   oxyCODONE  (ROXICODONE ) 5 MG immediate release tablet Take 1-2 tablets (5-10 mg total) by mouth every 4 (four) hours as needed for severe pain (pain score 7-10). 90 tablet 0    prochlorperazine  (COMPAZINE ) 10 MG tablet Take 1 tablet (10 mg total) by mouth every 6 (six) hours as needed (Nausea or vomiting). (Patient taking differently: Take 10 mg by mouth every 6 (six) hours as needed for nausea or vomiting (as needed).) 60 tablet 1   rivaroxaban  (XARELTO ) 20 MG TABS tablet Take 1 tablet (20 mg total) by mouth daily with supper. (Patient not taking: Reported on 12/11/2023) 90 tablet 3   silver  sulfADIAZINE  (SILVADENE ) 1 % cream Apply 1 Application topically daily. 20 g 0   zolpidem  (AMBIEN ) 10 MG tablet Take 1 tablet (10 mg total) by mouth at bedtime as needed. for sleep 60 tablet 0   No current facility-administered medications for this visit.   Facility-Administered Medications Ordered in Other Visits  Medication Dose Route Frequency Provider Last Rate Last Admin   dextrose  5 % solution   Intravenous Continuous Lanny Callander, MD   Stopped at 12/25/23 1433   fluorouracil  (ADRUCIL ) 3,500 mg in sodium chloride  0.9 %  80 mL chemo infusion  1,800 mg/m2 (Treatment Plan Recorded) Intravenous 1 day or 1 dose Lanny Callander, MD   Infusion Verify at 12/25/23 1438    PHYSICAL EXAMINATION: ECOG PERFORMANCE STATUS: 2 - Symptomatic, <50% confined to bed  Vitals:   12/25/23 1105  BP: 126/81  Pulse: 100  Resp: 17  Temp: (!) 97.5 F (36.4 C)  SpO2: 98%   Wt Readings from Last 3 Encounters:  12/25/23 86.5 kg  12/11/23 85.8 kg  11/26/23 87.7 kg     GENERAL:alert, no distress and comfortable SKIN: skin color, texture, turgor are normal, no rashes or significant lesions EYES: normal, Conjunctiva are pink and non-injected, sclera clear NECK: supple, thyroid  normal size, non-tender, without nodularity LYMPH:  no palpable lymphadenopathy in the cervical, axillary  LUNGS: clear to auscultation and percussion with normal breathing effort HEART: regular rate & rhythm and no murmurs and no lower extremity edema ABDOMEN:abdomen soft, non-tender and normal bowel sounds Musculoskeletal:no  cyanosis of digits and no clubbing  NEURO: alert & oriented x 3 with fluent speech, no focal motor/sensory deficits       LABORATORY DATA:  I have reviewed the data as listed    Latest Ref Rng & Units 12/25/2023   10:30 AM 12/11/2023   10:13 AM 11/26/2023    9:29 AM  CBC  WBC 4.0 - 10.5 K/uL 6.1  4.6  7.3   Hemoglobin 13.0 - 17.0 g/dL 88.9  88.5  87.5   Hematocrit 39.0 - 52.0 % 32.3  33.3  36.0   Platelets 150 - 400 K/uL 83  95  96         Latest Ref Rng & Units 12/25/2023   10:30 AM 12/11/2023   10:13 AM 11/26/2023    9:29 AM  CMP  Glucose 70 - 99 mg/dL 829  742  864   BUN 8 - 23 mg/dL 15  15  15    Creatinine 0.61 - 1.24 mg/dL 9.20  9.14  9.23   Sodium 135 - 145 mmol/L 141  138  140   Potassium 3.5 - 5.1 mmol/L 3.7  4.0  3.8   Chloride 98 - 111 mmol/L 109  104  108   CO2 22 - 32 mmol/L 27  27  26    Calcium  8.9 - 10.3 mg/dL 9.0  9.1  9.0   Total Protein 6.5 - 8.1 g/dL 6.7  6.6  6.6   Total Bilirubin 0.0 - 1.2 mg/dL 0.7  0.6  0.8   Alkaline Phos 38 - 126 U/L 142  143  152   AST 15 - 41 U/L 28  24  25    ALT 0 - 44 U/L 19  23  26        RADIOGRAPHIC STUDIES: I have personally reviewed the radiological images as listed and agreed with the findings in the report. No results found.    Orders Placed This Encounter  Procedures   CBC with Differential (Cancer Center Only)    Standing Status:   Future    Expected Date:   01/21/2024    Expiration Date:   01/20/2025   CMP (Cancer Center only)    Standing Status:   Future    Expected Date:   01/21/2024    Expiration Date:   01/20/2025   CBC with Differential (Cancer Center Only)    Standing Status:   Future    Expected Date:   02/04/2024    Expiration Date:   02/03/2025   CMP (Cancer Center  only)    Standing Status:   Future    Expected Date:   02/04/2024    Expiration Date:   02/03/2025   CBC with Differential (Cancer Center Only)    Standing Status:   Future    Expected Date:   02/18/2024    Expiration Date:   02/17/2025   CMP  (Cancer Center only)    Standing Status:   Future    Expected Date:   02/18/2024    Expiration Date:   02/17/2025   All questions were answered. The patient knows to call the clinic with any problems, questions or concerns. No barriers to learning was detected. The total time spent in the appointment was 25 minutes.     Onita Mattock, MD 12/25/2023

## 2023-12-27 ENCOUNTER — Inpatient Hospital Stay: Payer: Medicare Other

## 2023-12-27 VITALS — BP 141/70 | HR 73 | Temp 97.8°F | Resp 17

## 2023-12-27 DIAGNOSIS — Z5111 Encounter for antineoplastic chemotherapy: Secondary | ICD-10-CM | POA: Diagnosis not present

## 2023-12-27 DIAGNOSIS — C259 Malignant neoplasm of pancreas, unspecified: Secondary | ICD-10-CM

## 2023-12-27 MED ORDER — HEPARIN SOD (PORK) LOCK FLUSH 100 UNIT/ML IV SOLN
500.0000 [IU] | Freq: Once | INTRAVENOUS | Status: AC | PRN
  Administered 2023-12-27: 500 [IU]

## 2023-12-27 MED ORDER — SODIUM CHLORIDE 0.9% FLUSH
10.0000 mL | INTRAVENOUS | Status: DC | PRN
  Administered 2023-12-27: 10 mL

## 2023-12-30 ENCOUNTER — Other Ambulatory Visit: Payer: Self-pay

## 2023-12-30 DIAGNOSIS — C259 Malignant neoplasm of pancreas, unspecified: Secondary | ICD-10-CM

## 2023-12-30 MED ORDER — OXYCODONE HCL 5 MG PO TABS: 5.0000 mg | ORAL_TABLET | ORAL | 0 refills | Status: DC | PRN

## 2023-12-30 NOTE — Telephone Encounter (Signed)
Pt called for medication refill, see associated orders

## 2024-01-02 ENCOUNTER — Other Ambulatory Visit: Payer: Self-pay

## 2024-01-02 MED ORDER — CHLORPROMAZINE HCL 25 MG PO TABS: 25.0000 mg | ORAL_TABLET | Freq: Three times a day (TID) | ORAL | 2 refills | Status: DC | PRN

## 2024-01-02 NOTE — Progress Notes (Signed)
Palliative Medicine [facility]  [phone] [phone]   Name: Zachary Reid Date: 01/02/2024 MRN: [id number]  DOB: 12-22-1955  Patient Care Team: Lucky Cowboy, MD as PCP - General (Internal Medicine) Pollyann Samples, NP as PCP - Hematology/Oncology (Nurse Practitioner) Thomasene Ripple, DO as PCP - Cardiology (Cardiology) Nadara Mustard, MD as Consulting Physician (Orthopedic Surgery) Laurey Morale, MD as Consulting Physician (Cardiology) Malachy Mood, MD as Consulting Physician (Oncology) Pickenpack-Cousar, Arty Baumgartner, NP as Nurse Practitioner [facility] and Palliative Medicine)    INTERVAL HISTORY: Zachary Reid is a 68 y.o. male with oncologic medical history including pancreatic adenocarcinoma (06/2021), HLD, diabetes, CKD, HTN, and anxiety. Palliative ask to see for symptom management and goals of care.   SOCIAL HISTORY:     reports that he has never smoked. He has never used smokeless tobacco. He reports that he does not drink alcohol and does not use drugs.  ADVANCE DIRECTIVES:  Advanced directives on file naming wife, Zachary Reid, as patient healthcare power of attorney, and daughter, Zachary Reid as Counsellor.   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 tobb;  newly dx ED 10-31-2022 w/ RVR  in setting flank pain (kidney stone)  --  office note in  epic 11-07-2022 normal ETT 11-13-2022, echo 11-07-2022 ef 65-70% w/ mild LVH, mild MR,  zio monitor not completed,  started on toprol and xarelto daily   Personal history of chemotherapy    Receiving chemotherapy for metastatic pancreatic cancer. Most recent infusion (as of 12/02/22) was on 11/15/22.   Port-A-Cath in place 08/10/2021   Type 2 diabetes mellitus treated with insulin [facility])    endocrinologist--- dr Lucianne Muss   Vitamin D deficiency    takes Vit D supplements   Wears contact lenses     ALLERGIES:  is allergic to lipitor [atorvastatin].  MEDICATIONS:  Current Outpatient Medications  Medication Sig Dispense Refill   ALPRAZolam (XANAX) 0.5 MG tablet Take 1 tablet (0.5 mg total) by mouth 3 (three) times daily as needed for anxiety. 60 tablet 1   baclofen (LIORESAL) 10 MG tablet Take 1 tablet (10 mg total) by mouth 3 (three) times daily as needed for muscle spasms. 30 each 2   chlorproMAZINE (THORAZINE) 25 MG tablet Take 1 tablet (25 mg total) by mouth 3 (three) times daily as needed. 60 tablet 2   Cholecalciferol (VITAMIN D3) 125 MCG (5000 UT) CAPS Take 5,000 Units by mouth in the morning and at bedtime.     Continuous Glucose Sensor (DEXCOM G7 SENSOR) MISC 1 Device by Does not apply route as directed. Change sensor every 10 days 3 each 3   diphenoxylate-atropine (LOMOTIL) 2.5-0.025 MG tablet Take 1-2 tablets by mouth 4 (four) times daily as needed for diarrhea or loose stools. (Patient taking differently: Take 1-2 tablets by mouth 4 (four) times  daily as needed for diarrhea or loose stools (as needed).) 30 tablet 0   gabapentin (NEURONTIN) 600 MG tablet Take 1 tablet (600 mg total) by mouth 3 (three) times daily as needed (pain). 90 tablet 0   glucose blood test strip Use as instructed 100 each 12   hyoscyamine (LEVSIN SL) 0.125 MG SL tablet DISSOLVE ONE TABLET UNDER THE TONGUE 4 TIMES DAILY UP TO EVERY 4 HOURS AS NEEDED FOR NAUSEA, BLOATING, CRAMPING, OR DIARRHEA 100 tablet 0   insulin  degludec (TRESIBA FLEXTOUCH) 100 UNIT/ML FlexTouch Pen INJECT 32 UNITS INTO THE SKIN DAILY. 15 mL 4   insulin lispro (HUMALOG KWIKPEN) 100 UNIT/ML KwikPen 10 TO 15 UNITS BEFORE MEALS AS DIRECTED 15 mL 0   lipase/protease/amylase (CREON) 36000 UNITS CPEP capsule Take 1 capsule (36,000 Units total) by mouth 3 (three) times daily with meals. May also take 1 capsule (36,000 Units total) as needed (with snacks - up to 4 snacks daily). (Patient not taking: Reported on 12/11/2023) 150 capsule 11   metoprolol succinate (TOPROL-XL) 25 MG 24 hr tablet TAKE 1/2 TABLET BY MOUTH EVERY DAY 45 tablet 3   mirtazapine (REMERON) 7.5 MG tablet TAKE 1 TABLET BY MOUTH AT BEDTIME. (Patient not taking: Reported on 12/11/2023) 90 tablet 1   morphine (MS CONTIN) 15 MG 12 hr tablet Take 1 tablet (15 mg total) by mouth every 12 (twelve) hours. 60 tablet 0   ondansetron (ZOFRAN) 8 MG tablet Take 1 tablet (8 mg total) by mouth every 8 (eight) hours as needed for nausea or vomiting. (Patient taking differently: Take 8 mg by mouth every 8 (eight) hours as needed for nausea or vomiting (as needed).) 60 tablet 1   oxyCODONE (ROXICODONE) 5 MG immediate release tablet Take 1-2 tablets (5-10 mg total) by mouth every 4 (four) hours as needed for severe pain (pain score 7-10). 90 tablet 0   prochlorperazine (COMPAZINE) 10 MG tablet Take 1 tablet (10 mg total) by mouth every 6 (six) hours as needed (Nausea or vomiting). (Patient taking differently: Take 10 mg by mouth every 6 (six) hours as needed for nausea or vomiting (as needed).) 60 tablet 1   rivaroxaban (XARELTO) 20 MG TABS tablet Take 1 tablet (20 mg total) by mouth daily with supper. (Patient not taking: Reported on 12/11/2023) 90 tablet 3   silver sulfADIAZINE (SILVADENE) 1 % cream Apply 1 Application topically daily. 20 g 0   zolpidem (AMBIEN) 10 MG tablet Take 1 tablet (10 mg total) by mouth at bedtime as needed. for sleep 60 tablet 0   No current facility-administered medications  for this visit.    VITAL SIGNS: There were no vitals taken for this visit. There were no vitals filed for this visit.  Estimated body mass index is 26.61 kg/m as calculated from the following:   Height as of 10/01/23: 5\' 11"  (1.803 m).   Weight as of 12/25/23: 190 lb 12.8 oz (86.5 kg).   PERFORMANCE STATUS (ECOG) : 1 - Symptomatic but completely ambulatory  Assessment NAD RRR Normal breathing pattern AAO x3  Discussed the use of AI scribe software for clinical note transcription with the patient, who gave verbal consent to proceed.   IMPRESSION:  Mr. Zachary Reid is a 68 year old male who presents with persistent hiccups and pain management follow-up. No acute distress. He is doing well overall. Denies concerns with nausea, vomiting, or diarrhea. He has a history of constipation associated with his pain medication, which was severe during a previous episode but is  not currently experiencing it. He is not taking Creon, prescribed for digestive issues, and is unsure if he has any left. He has not experienced recent digestive symptoms such as cramping or tightness in the stomach. Constipation is controlled with stool softeners as needed. Occasional fatigue however is able to remain active. Reports concerns of weakness and atrophy.   Mr. Goldwire is reporting persistent hiccups that began after his last chemotherapy session. Initially, the hiccups were continuous throughout the day and night during the first week. In the second week, the frequency decreased, and he has not experienced any hiccups this morning. Hiccups did start during his infusion later today. He was previously taking baclofen for spasms with addition of helping with hiccups. His hiccups will stop for a period of time if he gags. He has not yet started Thorazine intended to address the hiccups but notes that taking his previous medication more regularly seemed to help reduce the frequency. Education provided also on the use of  his gabapentin however he has only been taking as needed. Recommended restarting around the clock to assist with his pain and hiccups.   We discussed his pain at length. He experiences pain that reaches a level of 7-8 out of 10 at its worst, managed with oxycodone IR as needed. He previously tried morphine er 15mg  but self-discontinued it due to hallucinations at night. He describes symptom as "dream enhancing and more vivid content" Oxycodone, taken as needed, reduces his pain to a level of 3 out of 10 however pain becomes more intense within several hours. Of note patient is also now taking Ambien at bedtime for insomnia. Education provided on the use of Ambien and MS Contin around the same time which most likely contributed to his hallucinations as patient tolerates morning dose without difficulty. Advise to restart and schedule his MS Contin at least 3-4 hours prior to use of Ambien. If symptoms continue, we will adjust considering other options. Kahlel reports he is taking oxycodone several times a day and his body lets him know that it is time due to intensity of the pain.   We will continue to closely monitor and support.  Assessment and Plan  Hiccups Persistent hiccups, possibly related to chemotherapy. Some improvement noted with regular use of Baclofen. New medication (Thorazine) not yet started. Gabapentin, currently taken as needed, may also help. -Start Thorazine as prescribed. -Restart Gabapentin to twice daily. -Contact medical team if no improvement or if side effects occur.  Cancer Related Pain Persistent pain, rated up to 7/10, managed with Oxycodone IR, however requiring more frequently around the clock. He self-discontinued use MS Contin due to hallucinations, particularly when taken close to Ambien. -Resume MS Contin 15mg  every 12 hours, adjusting timing to avoid close administration with Ambien. -Continue Oxycodone as needed.   Muscle Weakness Global weakness, possibly due to  muscle atrophy. Difficulty with gait and lifting objects. -Refer to outpatient physical therapy for strength training and gait improvement per oncology.  Medication Management Difficulty keeping track of multiple medications. Creon possibly discontinued inadvertently. -Refill Gabapentin and Oxycodone prescriptions. -Refill Creon prescription and advise to restart for improved digestion and possible bowel regularity.  Follow-up Next appointment scheduled for March 4th, 2025. -Plan to review in infusion on March 4th, 2025. -Encourage patient to contact office if any changes occur before then.  Patient expressed understanding and was in agreement with this plan. He also understands that He can call the clinic at any time with any questions, concerns, or complaints.  Any controlled substances utilized were prescribed in the context of palliative care. PDMP has been reviewed.   Visit consisted of counseling and education dealing with the complex and emotionally intense issues of symptom management and palliative care in the setting of serious and potentially life-threatening illness.  Willette Alma, AGPCNP-BC  Palliative Medicine Team/[facility]

## 2024-01-02 NOTE — Telephone Encounter (Signed)
Pt called c/o hiccups without relief from his baclofen. Thorazine called in pt educated to stop baclofen and how to take thorazine. Education provided, no further needs at this time.

## 2024-01-07 ENCOUNTER — Other Ambulatory Visit: Payer: Self-pay | Admitting: Nurse Practitioner

## 2024-01-07 DIAGNOSIS — C259 Malignant neoplasm of pancreas, unspecified: Secondary | ICD-10-CM

## 2024-01-07 NOTE — Assessment & Plan Note (Signed)
[id number] with numerous small cavitary lesions in bilateral lungs, MMR normal  -diagnosed 07/2021, after incidental finding on CT scan for kidney stone, by colonoscopy/EUS. -Baseline CA 19-9 elevated to 273 on 07/03/21 -He began first-line mFOLFIRINOX on 08/13/21, but required dose reduction and move to every 3 weeks. -his CA 19-9 trended up but is now stable. -restaging CT CAP 08/12/22 showed: multiple new and enlarging pulmonary metastases; pancreatic mass grossly stable. Will repeat in 3 months. -we changed his irinotecan to liposomal on 09/05/22. He tolerated better and feels stronger. -Due to disease progression, his treatment was changed to gemcitabine and Abraxane every 2 weeks on 01/02/2023 -He tolerated the first two cycles chemotherapy very well with mild fatigue.  I increased gemcitabine to 1000mg /m2 from cycle 3 -Restaging CT scan 03/12/2023 showed stable disease overall. His primary pancreatic tumor is slightly increased in size, however his multiple cavitary lung nodules are slightly improved with thinner walls now.   -will continue current therapy.  He is overall tolerating well, but he has developed worsening fatigue lately.  I will reduce gemcitabine dose to 800 mg/m again. -His tumor marker CA 19.9 continues trending down, which is a good clinical sign. -restaging CT 07/22/2023 showed moderate disease progression at the primary site and lung mets, I personally reviewed the scan images with patient and his wife in detail. -I changed his chemo to liposomal irinotecan and 5-FU pump infusion every 2 weeks on 08/07/2023 with dose reduction due to fatigue  -restaging CT 10/22/23 showed progression in lungs  -will change chemo to dose reduced FOLFOX starting 11/26/23  -He understands the goal of chemotherapy is palliative, to prolong his life -refer for physical therapy to help build muscle strength and endurance.

## 2024-01-07 NOTE — Progress Notes (Unsigned)
Patient Care Team: Zachary Cowboy, MD as PCP - General (Internal Medicine) Pollyann Samples, NP as PCP - Hematology/Oncology (Nurse Practitioner) Thomasene Ripple, DO as PCP - Cardiology (Cardiology) Nadara Mustard, MD as Consulting Physician (Orthopedic Surgery) Laurey Morale, MD as Consulting Physician (Cardiology) Malachy Mood, MD as Consulting Physician (Oncology) Pickenpack-Cousar, Arty Baumgartner, NP as Nurse Practitioner [facility])  Clinic Day:  01/08/2024  Referring physician: Malachy Mood, MD  ASSESSMENT & PLAN:   Assessment & Plan: Pancreatic adenocarcinoma [facility]) [phone] with numerous small cavitary lesions in bilateral lungs, MMR normal  -diagnosed 07/2021, after incidental finding on CT scan for kidney stone, by colonoscopy/EUS. -Baseline CA 19-9 elevated to 273 on 07/03/21 -He began first-line mFOLFIRINOX on 08/13/21, but required dose reduction and move to every 3 weeks. -his CA 19-9 trended up but is now stable. -restaging CT CAP 08/12/22 showed: multiple new and enlarging pulmonary metastases; pancreatic mass grossly stable. Will repeat in 3 months. -we changed his irinotecan to liposomal on 09/05/22. He tolerated better and feels stronger. -Due to disease progression, his treatment was changed to gemcitabine and Abraxane every 2 weeks on 01/02/2023 -He tolerated the first two cycles chemotherapy very well with mild fatigue.  I increased gemcitabine to 1000mg /m2 from cycle 3 -Restaging CT scan 03/12/2023 showed stable disease overall. His primary pancreatic tumor is slightly increased in size, however his multiple cavitary lung nodules are slightly improved with thinner walls now.   -will continue current therapy.  He is overall tolerating well, but he has developed worsening fatigue lately.  I will reduce gemcitabine dose to 800 mg/m again. -His tumor marker CA 19.9 continues trending down, which is a good clinical sign. -restaging CT 07/22/2023 showed moderate  disease progression at the primary site and lung mets, I personally reviewed the scan images with patient and his wife in detail. -I changed his chemo to liposomal irinotecan and 5-FU pump infusion every 2 weeks on 08/07/2023 with dose reduction due to fatigue  -restaging CT 10/22/23 showed progression in lungs  -will change chemo to dose reduced FOLFOX starting 11/26/23  -He understands the goal of chemotherapy is palliative, to prolong his life   Plan:  Reviewed labs. Adequate for treatment today.  Ca 19.9 pending.  Will see Lowella Bandy, NP for symptom management of pain and hiccups.  Referral to physical therapy to help with muscle weakness and shuffling gait. Today, proceed with treatment as scheduled.  Labs/flush, follow ups, and treatments as currently scheduled.   The patient understands the plans discussed today and is in agreement with them.  He knows to contact our office if he develops concerns prior to his next appointment.  I provided 30 minutes of face-to-face time during this encounter and > 50% was spent counseling as documented under my assessment and plan.    Carlean Jews, NP  [facility] - A DEPT OF [facility] [address] [place] [address] Dept: [phone] Dept Fax: [phone]   Orders Placed This Encounter  Procedures   Ambulatory referral to Physical Therapy    Referral Priority:   Routine    Referral Type:   Physical Medicine    Referral Reason:   Specialty Services Required    Requested Specialty:   Physical Therapy    Number of Visits Requested:   1      CHIEF COMPLAINT:  CC: Pancreatic cancer  Current Treatment: FOLFOX  INTERVAL HISTORY:  Avik is here today for repeat clinical assessment.  He was last seen by Dr. Mosetta Putt on 12/25/2023.  Restaging CT scan on 10/22/2023 showed progression in the lungs thus, chemo changed to dose reduced FOLFOX which she started 11/26/2023.  He has had  minimal side effects from chemotherapy which include cold sensitivity and numbness.  Today, he presents for cycle 4 day 1.  Today, he will also see Lowella Bandy, NP, palliative care, for management of pain medications.  States he has been having hiccups nearly all the time.  States this is really been an issue since he started chemotherapy treatments.  Previously, he had been able to "gag" and that would alleviate hiccups for several hours.  This is no longer working.  He does have medication prescribed by Lowella Bandy, NP at the pharmacy.  He has not picked it up to try.  He reports reduced appetite.  Basically since his appetite is just not what it used to be.  States he is ready to navigate pain, diarrhea, and constipation well in general.  He does have baseline neuropathy.  Has also noted a weakened gait.  States he shuffles when he walks.  Feels like he has muscle atrophy in his legs.  Would like to try exercises to strengthen his muscles, but afraid his legs will not hold him.  He is interested in physical therapy to help build muscle strength and endurance. He denies chest pain, chest pressure, or shortness of breath. He denies headaches or visual disturbances.   He denies fevers or chills. His weight has been stable.  I have reviewed the past medical history, past surgical history, social history and family history with the patient and they are unchanged from previous note.  ALLERGIES:  is allergic to lipitor [atorvastatin].  MEDICATIONS:  Current Outpatient Medications  Medication Sig Dispense Refill   ALPRAZolam (XANAX) 0.5 MG tablet Take 1 tablet (0.5 mg total) by mouth 3 (three) times daily as needed for anxiety. 60 tablet 1   baclofen (LIORESAL) 10 MG tablet Take 1 tablet (10 mg total) by mouth 3 (three) times daily as needed for muscle spasms. 30 each 2   chlorproMAZINE (THORAZINE) 25 MG tablet Take 1 tablet (25 mg total) by mouth 3 (three) times daily as needed. 60 tablet 2   Cholecalciferol (VITAMIN  D3) 125 MCG (5000 UT) CAPS Take 5,000 Units by mouth in the morning and at bedtime.     Continuous Glucose Sensor (DEXCOM G7 SENSOR) MISC 1 Device by Does not apply route as directed. Change sensor every 10 days 3 each 3   diphenoxylate-atropine (LOMOTIL) 2.5-0.025 MG tablet Take 1-2 tablets by mouth 4 (four) times daily as needed for diarrhea or loose stools. (Patient taking differently: Take 1-2 tablets by mouth 4 (four) times daily as needed for diarrhea or loose stools (as needed).) 30 tablet 0   gabapentin (NEURONTIN) 600 MG tablet Take 1 tablet (600 mg total) by mouth 3 (three) times daily as needed (pain). 90 tablet 0   glucose blood test strip Use as instructed 100 each 12   hyoscyamine (LEVSIN SL) 0.125 MG SL tablet DISSOLVE ONE TABLET UNDER THE TONGUE 4 TIMES DAILY UP TO EVERY 4 HOURS AS NEEDED FOR NAUSEA, BLOATING, CRAMPING, OR DIARRHEA 100 tablet 0   insulin degludec (TRESIBA FLEXTOUCH) 100 UNIT/ML FlexTouch Pen INJECT 32 UNITS INTO THE SKIN DAILY. 15 mL 4   insulin lispro (HUMALOG KWIKPEN) 100 UNIT/ML KwikPen 10 TO 15 UNITS BEFORE MEALS AS DIRECTED 15 mL 0  lipase/protease/amylase (CREON) 36000 UNITS CPEP capsule Take 1 capsule (36,000 Units total) by mouth 3 (three) times daily with meals. May also take 1 capsule (36,000 Units total) as needed (with snacks - up to 4 snacks daily). (Patient not taking: Reported on 12/11/2023) 150 capsule 11   metoprolol succinate (TOPROL-XL) 25 MG 24 hr tablet TAKE 1/2 TABLET BY MOUTH EVERY DAY 45 tablet 3   mirtazapine (REMERON) 7.5 MG tablet TAKE 1 TABLET BY MOUTH AT BEDTIME. (Patient not taking: Reported on 12/11/2023) 90 tablet 1   morphine (MS CONTIN) 15 MG 12 hr tablet Take 1 tablet (15 mg total) by mouth every 12 (twelve) hours. 60 tablet 0   ondansetron (ZOFRAN) 8 MG tablet Take 1 tablet (8 mg total) by mouth every 8 (eight) hours as needed for nausea or vomiting. (Patient taking differently: Take 8 mg by mouth every 8 (eight) hours as needed for  nausea or vomiting (as needed).) 60 tablet 1   oxyCODONE (ROXICODONE) 5 MG immediate release tablet Take 1-2 tablets (5-10 mg total) by mouth every 4 (four) hours as needed for severe pain (pain score 7-10). 90 tablet 0   prochlorperazine (COMPAZINE) 10 MG tablet Take 1 tablet (10 mg total) by mouth every 6 (six) hours as needed (Nausea or vomiting). (Patient taking differently: Take 10 mg by mouth every 6 (six) hours as needed for nausea or vomiting (as needed).) 60 tablet 1   rivaroxaban (XARELTO) 20 MG TABS tablet Take 1 tablet (20 mg total) by mouth daily with supper. (Patient not taking: Reported on 12/11/2023) 90 tablet 3   silver sulfADIAZINE (SILVADENE) 1 % cream Apply 1 Application topically daily. 20 g 0   zolpidem (AMBIEN) 10 MG tablet Take 1 tablet (10 mg total) by mouth at bedtime as needed. for sleep 60 tablet 0   No current facility-administered medications for this visit.    HISTORY OF PRESENT ILLNESS:   Oncology History Overview Note   Cancer Staging  Pancreatic adenocarcinoma [facility]) Staging form: Exocrine Pancreas, AJCC 8th Edition - Clinical stage from 07/26/2021: Stage IV (cT4, cN0, cM1) - Signed by Malachy Mood, MD on 07/28/2021    Pancreatic adenocarcinoma (HCC)  07/02/2021 Initial Diagnosis   Pancreatic adenocarcinoma (HCC)   07/26/2021 Cancer Staging   Staging form: Exocrine Pancreas, AJCC 8th Edition - Clinical stage from 07/26/2021: Stage IV (cT4, cN0, cM1) - Signed by Malachy Mood, MD on 07/28/2021 Stage prefix: Initial diagnosis Total positive nodes: 0   08/13/2021 - 07/06/2022 Chemotherapy   Patient is on Treatment Plan : PANCREAS Modified FOLFIRINOX q14d x 4 cycles      Genetic Testing   [facility] CancerNext-Expanded results (77 genes) were negative. No pathogenic variants were identified. 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 The report date is 08/15/2021.    The CancerNext-Expanded gene panel offered by [facility] and includes  sequencing, rearrangement, and RNA analysis for the following 77 genes: AIP, ALK, APC, ATM, AXIN2, BAP1, BARD1, BLM, BMPR1A, BRCA1, BRCA2, BRIP1, CDC73, CDH1, CDK4, CDKN1B, CDKN2A, CHEK2, CTNNA1, DICER1, FANCC, FH, FLCN, GALNT12, KIF1B, LZTR1, MAX, MEN1, MET, MLH1, MSH2, MSH3, MSH6, MUTYH, NBN, NF1, NF2, NTHL1, PALB2, PHOX2B, PMS2, POT1, PRKAR1A, PTCH1, PTEN, RAD51C, RAD51D, RB1, RECQL, RET, SDHA, SDHAF2, SDHB, SDHC, SDHD, SMAD4, SMARCA4, SMARCB1, SMARCE1, STK11, SUFU, TMEM127, TP53, TSC1, TSC2, VHL and XRCC2 (sequencing and deletion/duplication); EGFR, EGLN1, HOXB13, KIT, MITF, PDGFRA, POLD1, and POLE (sequencing only); EPCAM and GREM1 (deletion/duplication only).     08/13/2021 - 12/06/2022 Chemotherapy  Patient is on Treatment Plan : PANCREAS Modified FOLFIRINOX q14d x 4 cycles     10/25/2021 Imaging   EXAM: CT CHEST, ABDOMEN, AND PELVIS WITH CONTRAST  IMPRESSION: 1. Innumerable bilateral pulmonary nodules, many of which are cavitary, consistent with metastatic disease. These nodules show no substantial change in are minimally progressed in the interval. 2. Mix cystic and solid lesion in the head and body of the pancreas is similar to prior and also comparing back to MRI 07/02/2021. 3. Hepatic cysts. 4. 8 mm nonobstructing left renal stone. 5. Aortic Atherosclerosis (ICD10-I70.0).   01/28/2022 Imaging   EXAM: CT CHEST, ABDOMEN, AND PELVIS WITH CONTRAST  IMPRESSION: 1. Innumerable bilateral small solid and cavitary pulmonary nodules, some of the cavitary nodules are slightly less thick-walled when compared with prior exam. 2. Mixed cystic and solid lesion in the head and body of the pancreas is similar to prior exam. 3. Nonobstructing left renal stone. 4.  Aortic Atherosclerosis (ICD10-I70.0).   11/29/2022 Imaging    IMPRESSION: 1. No significant change in primary pancreatic mass and adjacent cystic component. Unchanged appearance of soft tissue extending to involve the adjacent  celiac axis, superior mesenteric artery origin, and portal confluence. 2. 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 3. Innumerable small pulmonary nodules throughout the lungs, the majority of which are cavitary. Although interval change is difficult to appreciate for the majority of these nodules due to small size, at least some of these are slightly enlarged, consistent with worsened pulmonary metastatic disease. 4. Newly enlarged left retroperitoneal lymph nodes, which may reflect nodal metastatic disease or perhaps reactive to left hydronephrosis. Attention on follow-up. 5. A sizable calculus previously seen in the left renal pelvis has migrated to the middle third of the left ureter, with placement of a double-J left ureteral stent, with formed pigtails in the left renal pelvis and urinary bladder. Moderate left hydronephrosis and proximal hydroureter. 6. Coronary artery disease.   Aortic Atherosclerosis (ICD10-I70.0).   01/02/2023 - 06/26/2023 Chemotherapy   Patient is on Treatment Plan : PANCREATIC Abraxane D1,8,15 + Gemcitabine D1,8,15 q28d     03/12/2023 Imaging    IMPRESSION: 1. Primary pancreatic mass with adjacent cystic component is slightly increased in size when compared with the prior exam. Similar soft tissue extending involve the celiac axis superior mesenteric artery origin and portal confluence. 2. Innumerable bilateral pulmonary nodules, some of which are cavitary, unchanged when compared with the prior. 3. Left retroperitoneal lymph nodes are decreased in size, likely resolving reactive adenopathy 4. Interval removal of left ureter stent.  No hydronephrosis. 5. coronary artery disease and aortic Atherosclerosis (ICD10-I70.0).   07/22/2023 Imaging   CT CAP W CONTRAST   IMPRESSION: 1. Today's study demonstrates progression of disease as evidenced by enlargement of the primary  pancreatic mass, mild enlargement of numerous prominent borderline enlarged and mildly enlarged upper abdominal and retroperitoneal lymph nodes, and increased number and size of numerous metastatic nodules scattered throughout the lungs bilaterally, as detailed above. 2. 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 Main portal vein remains patent at this time. 3. 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 4. Nonobstructive nephrolithiasis in the kidneys bilaterally measuring 2-4 mm in size. 5. Aortic atherosclerosis, in addition to left main and three-vessel coronary artery disease. 6. Additional incidental findings, as above.   08/07/2023 - 10/24/2023 Chemotherapy   Patient is on  Treatment Plan : PANCREAS Liposomal Irinotecan + Leucovorin + 5-FU IVCI q14d     11/26/2023 -  Chemotherapy   Patient is on Treatment Plan : PANCREAS FOLFOX q14d         REVIEW OF SYSTEMS:   Constitutional: Denies fevers, chills or abnormal weight loss Eyes: Denies blurriness of vision Ears, nose, mouth, throat, and face: Denies mucositis or sore throat Respiratory: Denies cough, dyspnea or wheezes Cardiovascular: Denies palpitation, chest discomfort or lower extremity swelling Gastrointestinal:  Denies nausea, heartburn or change in bowel habits.  Hiccups.  Decreased appetite. Skin: Denies abnormal skin rashes Lymphatics: Denies new lymphadenopathy or easy bruising Neurological:Denies numbness, tingling or new weaknesses Behavioral/Psych: Mood is stable, no new changes  All other systems were reviewed with the patient and are negative.   VITALS:   Today's Vitals   01/08/24 0949  BP: 125/68  Pulse: 83  Resp: 16  Temp: 97.8 F (36.6 C)  TempSrc: Temporal  SpO2: 100%  Weight: 191 lb (86.6 kg)   Body mass index is 26.64 kg/m.   Wt Readings from Last 3 Encounters:   01/08/24 191 lb (86.6 kg)  12/25/23 190 lb 12.8 oz (86.5 kg)  12/11/23 189 lb 3.2 oz (85.8 kg)    Body mass index is 26.64 kg/m.  Performance status (ECOG): 1 - Symptomatic but completely ambulatory  PHYSICAL EXAM:   GENERAL:alert, no distress and comfortable SKIN: skin color, texture, turgor are normal, no rashes or significant lesions EYES: normal, Conjunctiva are pink and non-injected, sclera clear OROPHARYNX:no exudate, no erythema and lips, buccal mucosa, and tongue normal  NECK: supple, thyroid normal size, non-tender, without nodularity LYMPH:  no palpable lymphadenopathy in the cervical, axillary or inguinal LUNGS: clear to auscultation and percussion with normal breathing effort HEART: regular rate & rhythm and no murmurs and no lower extremity edema ABDOMEN:abdomen soft, non-tender and normal bowel sounds Musculoskeletal:no cyanosis of digits and no clubbing  NEURO: alert & oriented x 3 with fluent speech, no focal motor/sensory deficits  LABORATORY DATA:  I have reviewed the data as listed    Component Value Date/Time   NA 141 01/08/2024 0933   K 3.5 01/08/2024 0933   CL 110 01/08/2024 0933   CO2 25 01/08/2024 0933   GLUCOSE 90 01/08/2024 0933   BUN 12 01/08/2024 0933   CREATININE 0.68 01/08/2024 0933   CREATININE 0.88 06/05/2021 1130   CALCIUM 8.7 (L) 01/08/2024 0933   PROT 5.9 (L) 01/08/2024 0933   ALBUMIN 3.4 (L) 01/08/2024 0933   AST 27 01/08/2024 0933   ALT 19 01/08/2024 0933   ALKPHOS 151 (H) 01/08/2024 0933   BILITOT 0.6 01/08/2024 0933   GFRNONAA >60 01/08/2024 0933   GFRNONAA 96 03/05/2021 0915   GFRAA 112 03/05/2021 0915    Lab Results  Component Value Date   WBC 6.4 01/08/2024   NEUTROABS 4.4 01/08/2024   HGB 10.2 (L) 01/08/2024   HCT 30.4 (L) 01/08/2024   MCV 95.9 01/08/2024   PLT 84 (L) 01/08/2024

## 2024-01-08 ENCOUNTER — Encounter: Payer: Self-pay | Admitting: Nurse Practitioner

## 2024-01-08 ENCOUNTER — Inpatient Hospital Stay (HOSPITAL_BASED_OUTPATIENT_CLINIC_OR_DEPARTMENT_OTHER): Payer: Medicare Other | Admitting: Nurse Practitioner

## 2024-01-08 ENCOUNTER — Inpatient Hospital Stay: Payer: Medicare Other

## 2024-01-08 VITALS — BP 125/68 | HR 83 | Temp 97.8°F | Resp 16 | Wt 191.0 lb

## 2024-01-08 DIAGNOSIS — C259 Malignant neoplasm of pancreas, unspecified: Secondary | ICD-10-CM | POA: Diagnosis not present

## 2024-01-08 DIAGNOSIS — M792 Neuralgia and neuritis, unspecified: Secondary | ICD-10-CM

## 2024-01-08 DIAGNOSIS — R530 Neoplastic (malignant) related fatigue: Secondary | ICD-10-CM

## 2024-01-08 DIAGNOSIS — G893 Neoplasm related pain (acute) (chronic): Secondary | ICD-10-CM | POA: Diagnosis not present

## 2024-01-08 DIAGNOSIS — Z5111 Encounter for antineoplastic chemotherapy: Secondary | ICD-10-CM | POA: Diagnosis not present

## 2024-01-08 DIAGNOSIS — Z515 Encounter for palliative care: Secondary | ICD-10-CM | POA: Diagnosis not present

## 2024-01-08 DIAGNOSIS — R630 Anorexia: Secondary | ICD-10-CM

## 2024-01-08 DIAGNOSIS — R066 Hiccough: Secondary | ICD-10-CM

## 2024-01-08 LAB — CBC WITH DIFFERENTIAL (CANCER CENTER ONLY)
Abs Immature Granulocytes: 0.02 10*3/uL (ref 0.00–0.07)
Basophils Absolute: 0 10*3/uL (ref 0.0–0.1)
Basophils Relative: 0 %
Eosinophils Absolute: 0.3 10*3/uL (ref 0.0–0.5)
Eosinophils Relative: 5 %
HCT: 30.4 % — ABNORMAL LOW (ref 39.0–52.0)
Hemoglobin: 10.2 g/dL — ABNORMAL LOW (ref 13.0–17.0)
Immature Granulocytes: 0 %
Lymphocytes Relative: 14 %
Lymphs Abs: 0.9 10*3/uL (ref 0.7–4.0)
MCH: 32.2 pg (ref 26.0–34.0)
MCHC: 33.6 g/dL (ref 30.0–36.0)
MCV: 95.9 fL (ref 80.0–100.0)
Monocytes Absolute: 0.7 10*3/uL (ref 0.1–1.0)
Monocytes Relative: 11 %
Neutro Abs: 4.4 10*3/uL (ref 1.7–7.7)
Neutrophils Relative %: 70 %
Platelet Count: 84 10*3/uL — ABNORMAL LOW (ref 150–400)
RBC: 3.17 MIL/uL — ABNORMAL LOW (ref 4.22–5.81)
RDW: 13.1 % (ref 11.5–15.5)
WBC Count: 6.4 10*3/uL (ref 4.0–10.5)
nRBC: 0 % (ref 0.0–0.2)

## 2024-01-08 LAB — CMP (CANCER CENTER ONLY)
ALT: 19 U/L (ref 0–44)
AST: 27 U/L (ref 15–41)
Albumin: 3.4 g/dL — ABNORMAL LOW (ref 3.5–5.0)
Alkaline Phosphatase: 151 U/L — ABNORMAL HIGH (ref 38–126)
Anion gap: 6 (ref 5–15)
BUN: 12 mg/dL (ref 8–23)
CO2: 25 mmol/L (ref 22–32)
Calcium: 8.7 mg/dL — ABNORMAL LOW (ref 8.9–10.3)
Chloride: 110 mmol/L (ref 98–111)
Creatinine: 0.68 mg/dL (ref 0.61–1.24)
GFR, Estimated: >60 mL/min (ref >60)
Glucose, Bld: 90 mg/dL (ref 70–99)
Potassium: 3.5 mmol/L (ref 3.5–5.1)
Sodium: 141 mmol/L (ref 135–145)
Total Bilirubin: 0.6 mg/dL (ref 0.0–1.2)
Total Protein: 5.9 g/dL — ABNORMAL LOW (ref 6.5–8.1)

## 2024-01-08 MED ORDER — OXYCODONE HCL 5 MG PO TABS: 5.0000 mg | ORAL_TABLET | ORAL | 0 refills | Status: DC | PRN

## 2024-01-08 MED ORDER — DEXAMETHASONE SODIUM PHOSPHATE 10 MG/ML IJ SOLN
10.0000 mg | Freq: Once | INTRAMUSCULAR | Status: AC
  Administered 2024-01-08: 10 mg via INTRAVENOUS
  Filled 2024-01-08: qty 1

## 2024-01-08 MED ORDER — GABAPENTIN 600 MG PO TABS: 600.0000 mg | ORAL_TABLET | Freq: Three times a day (TID) | ORAL | 0 refills | Status: DC | PRN

## 2024-01-08 MED ORDER — SODIUM CHLORIDE 0.9 % IV SOLN
1800.0000 mg/m2 | INTRAVENOUS | Status: DC
  Administered 2024-01-08: 3500 mg via INTRAVENOUS
  Filled 2024-01-08: qty 70

## 2024-01-08 MED ORDER — OXALIPLATIN CHEMO INJECTION 100 MG/20ML
50.0000 mg/m2 | Freq: Once | INTRAVENOUS | Status: AC
  Administered 2024-01-08: 100 mg via INTRAVENOUS
  Filled 2024-01-08: qty 20

## 2024-01-08 MED ORDER — DEXTROSE 5 % IV SOLN: INTRAVENOUS | Status: DC

## 2024-01-08 MED ORDER — PALONOSETRON HCL INJECTION 0.25 MG/5ML
0.2500 mg | Freq: Once | INTRAVENOUS | Status: AC
  Administered 2024-01-08: 0.25 mg via INTRAVENOUS
  Filled 2024-01-08: qty 5

## 2024-01-08 MED ORDER — LEUCOVORIN CALCIUM INJECTION 350 MG
400.0000 mg/m2 | Freq: Once | INTRAVENOUS | Status: AC
  Administered 2024-01-08: 844 mg via INTRAVENOUS
  Filled 2024-01-08: qty 42.2

## 2024-01-09 LAB — CANCER ANTIGEN 19-9: CA 19-9: 1907 U/mL — ABNORMAL HIGH (ref 0–35)

## 2024-01-10 ENCOUNTER — Inpatient Hospital Stay: Payer: Medicare Other

## 2024-01-10 VITALS — BP 132/67 | HR 85 | Temp 98.1°F | Resp 16

## 2024-01-10 DIAGNOSIS — Z5111 Encounter for antineoplastic chemotherapy: Secondary | ICD-10-CM | POA: Diagnosis not present

## 2024-01-10 DIAGNOSIS — C259 Malignant neoplasm of pancreas, unspecified: Secondary | ICD-10-CM

## 2024-01-10 MED ORDER — SODIUM CHLORIDE 0.9% FLUSH
10.0000 mL | INTRAVENOUS | Status: DC | PRN
  Administered 2024-01-10: 10 mL

## 2024-01-10 MED ORDER — HEPARIN SOD (PORK) LOCK FLUSH 100 UNIT/ML IV SOLN
500.0000 [IU] | Freq: Once | INTRAVENOUS | Status: AC | PRN
Start: 2024-01-10 — End: 2024-01-10
  Administered 2024-01-10: 500 [IU]

## 2024-01-16 ENCOUNTER — Other Ambulatory Visit: Payer: Self-pay

## 2024-01-16 DIAGNOSIS — C259 Malignant neoplasm of pancreas, unspecified: Secondary | ICD-10-CM

## 2024-01-16 DIAGNOSIS — Z515 Encounter for palliative care: Secondary | ICD-10-CM

## 2024-01-16 MED ORDER — OXYCODONE HCL 10 MG PO TABS: 10.0000 mg | ORAL_TABLET | ORAL | 0 refills | Status: DC | PRN

## 2024-01-16 NOTE — Telephone Encounter (Signed)
 T called reporting that he increased his oxycodone at home and found greater pain relief, per Lowella Bandy, NP medication adjusted and refill sent in. Pt also mentioned that he re-started his MS Contin, which he has previously stopped himself d/t hallucinations, patient has since restarted on his own accord and is "feeling great". RN discussed ED precautions and to immediately call if he begins to experience similar symptoms. Pt verbalized understanding.

## 2024-01-18 NOTE — Progress Notes (Unsigned)
 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 -Baseline CA 19-9 elevated to 273 on 07/03/21 -He began first-line mFOLFIRINOX on 08/13/21, but required dose reduction and move to every 3 weeks. -his CA 19-9 trended up but is now stable. -restaging CT CAP 08/12/22 showed: multiple new and enlarging pulmonary metastases; pancreatic mass grossly stable. Will repeat in 3 months. -we changed his irinotecan to liposomal on 09/05/22. He tolerated better and feels stronger. -Due to disease progression, his treatment was changed to gemcitabine and Abraxane every 2 weeks on 01/02/2023 -He tolerated the first two cycles chemotherapy very well with mild fatigue.  I increased gemcitabine to 1000mg /m2 from cycle 3 -Restaging CT scan 03/12/2023 showed stable disease overall. His primary pancreatic tumor is slightly increased in size, however his multiple cavitary lung nodules are slightly improved with thinner walls now.   -will continue current therapy.  He is overall tolerating well, but he has developed worsening fatigue lately.  I will reduce gemcitabine dose to 800 mg/m again. -His tumor marker CA 19.9 continues trending down, which is a good clinical sign. -restaging CT 07/22/2023 showed moderate  disease progression at the primary site and lung mets, I personally reviewed the scan images with patient and his wife in detail. -I changed his chemo to liposomal irinotecan and 5-FU pump infusion every 2 weeks on 08/07/2023 with dose reduction due to fatigue  -restaging CT 10/22/23 showed progression in lungs  -will change chemo to dose reduced FOLFOX starting 11/26/23  -He understands the goal of chemotherapy is palliative, to prolong his life -refer for physical therapy to help build muscle strength and endurance.  01/20/2024  -cycle 5 day 1 FOLFOX.  Will order restaging CT CAP to review prior to cycle 6.     The patient understands the plans discussed today and is in agreement with them.  He knows to contact our office if he develops concerns prior to his next appointment.  I provided *** minutes of face-to-face time during this encounter and > 50% was spent counseling as documented under my assessment and plan.    Carlean Jews, NP  [facility] - A DEPT OF [facility] [address] [place] [address] Dept: [phone] Dept Fax: [phone]   No orders of the defined types were placed in this encounter.     CHIEF COMPLAINT:  CC: Pancreatic adenocarcinoma  Current Treatment: FOLFOX  INTERVAL HISTORY:  Zachary Reid is here today for repeat clinical assessment.  Was last seen by myself on 01/08/2024.  Today he starts cycle 5 day 1 of FOLFOX.  Will require CT CAP for restaging prior to his next treatment in 3 weeks.  Was referred to physical therapy due to muscle atrophy, weakness, and gait abnormalities.  Is also seeing Lowella Bandy, NP, palliative care to manage pain.  He denies fevers  or chills. He denies pain. His appetite is good. His weight {Weight change:[id number]}.  I have reviewed the past medical history, past surgical history, social history and family history with the patient and they are unchanged from previous  note.  ALLERGIES:  is allergic to lipitor [atorvastatin].  MEDICATIONS:  Current Outpatient Medications  Medication Sig Dispense Refill   ALPRAZolam (XANAX) 0.5 MG tablet Take 1 tablet (0.5 mg total) by mouth 3 (three) times daily as needed for anxiety. 60 tablet 1   baclofen (LIORESAL) 10 MG tablet Take 1 tablet (10 mg total) by mouth 3 (three) times daily as needed for muscle spasms. 30 each 2   chlorproMAZINE (THORAZINE) 25 MG tablet Take 1 tablet (25 mg total) by mouth 3 (three) times daily as needed. 60 tablet 2   Cholecalciferol (VITAMIN D3) 125 MCG (5000 UT) CAPS Take 5,000 Units by mouth in the morning and at bedtime.     Continuous Glucose Sensor (DEXCOM G7 SENSOR) MISC 1 Device by Does not apply route as directed. Change sensor every 10 days 3 each 3   diphenoxylate-atropine (LOMOTIL) 2.5-0.025 MG tablet Take 1-2 tablets by mouth 4 (four) times daily as needed for diarrhea or loose stools. (Patient taking differently: Take 1-2 tablets by mouth 4 (four) times daily as needed for diarrhea or loose stools (as needed).) 30 tablet 0   gabapentin (NEURONTIN) 600 MG tablet Take 1 tablet (600 mg total) by mouth 3 (three) times daily as needed (pain). 90 tablet 0   glucose blood test strip Use as instructed 100 each 12   hyoscyamine (LEVSIN SL) 0.125 MG SL tablet DISSOLVE ONE TABLET UNDER THE TONGUE 4 TIMES DAILY UP TO EVERY 4 HOURS AS NEEDED FOR NAUSEA, BLOATING, CRAMPING, OR DIARRHEA 100 tablet 0   insulin degludec (TRESIBA FLEXTOUCH) 100 UNIT/ML FlexTouch Pen INJECT 32 UNITS INTO THE SKIN DAILY. 15 mL 4   insulin lispro (HUMALOG KWIKPEN) 100 UNIT/ML KwikPen 10 TO 15 UNITS BEFORE MEALS AS DIRECTED 15 mL 0   lipase/protease/amylase (CREON) 36000 UNITS CPEP capsule Take 1 capsule (36,000 Units total) by mouth 3 (three) times daily with meals. May also take 1 capsule (36,000 Units total) as needed (with snacks - up to 4 snacks daily). (Patient not taking: Reported on 12/11/2023) 150 capsule 11    metoprolol succinate (TOPROL-XL) 25 MG 24 hr tablet TAKE 1/2 TABLET BY MOUTH EVERY DAY 45 tablet 3   morphine (MS CONTIN) 15 MG 12 hr tablet Take 1 tablet (15 mg total) by mouth every 12 (twelve) hours. 60 tablet 0   ondansetron (ZOFRAN) 8 MG tablet Take 1 tablet (8 mg total) by mouth every 8 (eight) hours as needed for nausea or vomiting. (Patient taking differently: Take 8 mg by mouth every 8 (eight) hours as needed for nausea or vomiting (as needed).) 60 tablet 1   oxyCODONE 10 MG TABS Take 1-2 tablets (10-20 mg total) by mouth every 4 (four) hours as needed for severe pain (pain score 7-10). 120 tablet 0   prochlorperazine (COMPAZINE) 10 MG tablet Take 1 tablet (10 mg total) by mouth every 6 (six) hours as needed (Nausea or vomiting). (Patient taking differently: Take 10 mg by mouth every 6 (six) hours as needed for nausea or vomiting (as needed).) 60 tablet 1   silver sulfADIAZINE (SILVADENE) 1 % cream Apply 1 Application topically daily. 20 g 0   zolpidem (AMBIEN) 10 MG tablet Take 1 tablet (10 mg total) by mouth at bedtime as needed. for sleep 60  tablet 0   No current facility-administered medications for this visit.    HISTORY OF PRESENT ILLNESS:   Oncology History Overview Note   Cancer Staging  Pancreatic adenocarcinoma [facility]) Staging form: Exocrine Pancreas, AJCC 8th Edition - Clinical stage from 07/26/2021: Stage IV (cT4, cN0, cM1) - Signed by Malachy Mood, MD on 07/28/2021    Pancreatic adenocarcinoma (HCC)  07/02/2021 Initial Diagnosis   Pancreatic adenocarcinoma (HCC)   07/26/2021 Cancer Staging   Staging form: Exocrine Pancreas, AJCC 8th Edition - Clinical stage from 07/26/2021: Stage IV (cT4, cN0, cM1) - Signed by Malachy Mood, MD on 07/28/2021 Stage prefix: Initial diagnosis Total positive nodes: 0   08/13/2021 - 07/06/2022 Chemotherapy   Patient is on Treatment Plan : PANCREAS Modified FOLFIRINOX q14d x 4 cycles      Genetic Testing   [facility] CancerNext-Expanded results (77 genes)  were negative. No pathogenic variants were identified. 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 The report date is 08/15/2021.    The CancerNext-Expanded gene panel offered by [facility] and includes sequencing, rearrangement, and RNA analysis for the following 77 genes: AIP, ALK, APC, ATM, AXIN2, BAP1, BARD1, BLM, BMPR1A, BRCA1, BRCA2, BRIP1, CDC73, CDH1, CDK4, CDKN1B, CDKN2A, CHEK2, CTNNA1, DICER1, FANCC, FH, FLCN, GALNT12, KIF1B, LZTR1, MAX, MEN1, MET, MLH1, MSH2, MSH3, MSH6, MUTYH, NBN, NF1, NF2, NTHL1, PALB2, PHOX2B, PMS2, POT1, PRKAR1A, PTCH1, PTEN, RAD51C, RAD51D, RB1, RECQL, RET, SDHA, SDHAF2, SDHB, SDHC, SDHD, SMAD4, SMARCA4, SMARCB1, SMARCE1, STK11, SUFU, TMEM127, TP53, TSC1, TSC2, VHL and XRCC2 (sequencing and deletion/duplication); EGFR, EGLN1, HOXB13, KIT, MITF, PDGFRA, POLD1, and POLE (sequencing only); EPCAM and GREM1 (deletion/duplication only).     08/13/2021 - 12/06/2022 Chemotherapy   Patient is on Treatment Plan : PANCREAS Modified FOLFIRINOX q14d x 4 cycles     10/25/2021 Imaging   EXAM: CT CHEST, ABDOMEN, AND PELVIS WITH CONTRAST  IMPRESSION: 1. Innumerable bilateral pulmonary nodules, many of which are cavitary, consistent with metastatic disease. These nodules show no substantial change in are minimally progressed in the interval. 2. Mix cystic and solid lesion in the head and body of the pancreas is similar to prior and also comparing back to MRI 07/02/2021. 3. Hepatic cysts. 4. 8 mm nonobstructing left renal stone. 5. Aortic Atherosclerosis (ICD10-I70.0).   01/28/2022 Imaging   EXAM: CT CHEST, ABDOMEN, AND PELVIS WITH CONTRAST  IMPRESSION: 1. Innumerable bilateral small solid and cavitary pulmonary nodules, some of the cavitary nodules are slightly less thick-walled when compared with prior exam. 2. Mixed cystic and solid lesion in the head and body of the pancreas is similar to prior exam. 3. Nonobstructing left renal  stone. 4.  Aortic Atherosclerosis (ICD10-I70.0).   11/29/2022 Imaging    IMPRESSION: 1. No significant change in primary pancreatic mass and adjacent cystic component. Unchanged appearance of soft tissue extending to involve the adjacent celiac axis, superior mesenteric artery origin, and portal confluence. 2. 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 3. Innumerable small pulmonary nodules throughout the lungs, the majority of which are cavitary. Although interval change is difficult to appreciate for the majority of these nodules due to small size, at least some of these are slightly enlarged, consistent with worsened pulmonary metastatic disease. 4. Newly enlarged left retroperitoneal lymph nodes, which may reflect nodal metastatic disease or perhaps reactive to left hydronephrosis. Attention on follow-up. 5. A sizable calculus previously seen in the left renal pelvis has migrated to the middle third of the  left ureter, with placement of a double-J left ureteral stent, with formed pigtails in the left renal pelvis and urinary bladder. Moderate left hydronephrosis and proximal hydroureter. 6. Coronary artery disease.   Aortic Atherosclerosis (ICD10-I70.0).   01/02/2023 - 06/26/2023 Chemotherapy   Patient is on Treatment Plan : PANCREATIC Abraxane D1,8,15 + Gemcitabine D1,8,15 q28d     03/12/2023 Imaging    IMPRESSION: 1. Primary pancreatic mass with adjacent cystic component is slightly increased in size when compared with the prior exam. Similar soft tissue extending involve the celiac axis superior mesenteric artery origin and portal confluence. 2. Innumerable bilateral pulmonary nodules, some of which are cavitary, unchanged when compared with the prior. 3. Left retroperitoneal lymph nodes are decreased in size, likely resolving reactive adenopathy 4. Interval removal of left ureter stent.  No  hydronephrosis. 5. coronary artery disease and aortic Atherosclerosis (ICD10-I70.0).   07/22/2023 Imaging   CT CAP W CONTRAST   IMPRESSION: 1. Today's study demonstrates progression of disease as evidenced by enlargement of the primary pancreatic mass, mild enlargement of numerous prominent borderline enlarged and mildly enlarged upper abdominal and retroperitoneal lymph nodes, and increased number and size of numerous metastatic nodules scattered throughout the lungs bilaterally, as detailed above. 2. 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 Main portal vein remains patent at this time. 3. 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 4. Nonobstructive nephrolithiasis in the kidneys bilaterally measuring 2-4 mm in size. 5. Aortic atherosclerosis, in addition to left main and three-vessel coronary artery disease. 6. Additional incidental findings, as above.   08/07/2023 - 10/24/2023 Chemotherapy   Patient is on Treatment Plan : PANCREAS Liposomal Irinotecan + Leucovorin + 5-FU IVCI q14d     11/26/2023 -  Chemotherapy   Patient is on Treatment Plan : PANCREAS FOLFOX q14d         REVIEW OF SYSTEMS:   Constitutional: Denies fevers, chills or abnormal weight loss Eyes: Denies blurriness of vision Ears, nose, mouth, throat, and face: Denies mucositis or sore throat Respiratory: Denies cough, dyspnea or wheezes Cardiovascular: Denies palpitation, chest discomfort or lower extremity swelling Gastrointestinal:  Denies nausea, heartburn or change in bowel habits Skin: Denies abnormal skin rashes Lymphatics: Denies new lymphadenopathy or easy bruising Neurological:Denies numbness, tingling or new weaknesses Behavioral/Psych: Mood is stable, no new changes  All other systems were reviewed with the patient and are negative.   VITALS:  There were no vitals  taken for this visit.  Wt Readings from Last 3 Encounters:  01/08/24 191 lb (86.6 kg)  12/25/23 190 lb 12.8 oz (86.5 kg)  12/11/23 189 lb 3.2 oz (85.8 kg)    There is no height or weight on file to calculate BMI.  Performance status (ECOG): {CHL ONC [id number]  PHYSICAL EXAM:   GENERAL:alert, no distress and comfortable SKIN: skin color, texture, turgor are normal, no rashes or significant lesions EYES: normal, Conjunctiva are pink and non-injected, sclera clear OROPHARYNX:no exudate, no erythema and lips, buccal mucosa, and tongue normal  NECK: supple, thyroid normal size, non-tender, without nodularity LYMPH:  no palpable lymphadenopathy in the cervical, axillary or inguinal LUNGS: clear to auscultation and percussion with normal breathing effort HEART: regular rate & rhythm and no murmurs and no lower extremity edema ABDOMEN:abdomen soft, non-tender and normal bowel sounds Musculoskeletal:no cyanosis of digits and no clubbing  NEURO: alert & oriented x 3 with fluent speech, no focal motor/sensory deficits  LABORATORY  DATA:  I have reviewed the data as listed    Component Value Date/Time   NA 141 01/08/2024 0933   K 3.5 01/08/2024 0933   CL 110 01/08/2024 0933   CO2 25 01/08/2024 0933   GLUCOSE 90 01/08/2024 0933   BUN 12 01/08/2024 0933   CREATININE 0.68 01/08/2024 0933   CREATININE 0.88 06/05/2021 1130   CALCIUM 8.7 (L) 01/08/2024 0933   PROT 5.9 (L) 01/08/2024 0933   ALBUMIN 3.4 (L) 01/08/2024 0933   AST 27 01/08/2024 0933   ALT 19 01/08/2024 0933   ALKPHOS 151 (H) 01/08/2024 0933   BILITOT 0.6 01/08/2024 0933   GFRNONAA >60 01/08/2024 0933   GFRNONAA 96 03/05/2021 0915   GFRAA 112 03/05/2021 0915    No results found for: "SPEP", "UPEP"  Lab Results  Component Value Date   WBC 6.4 01/08/2024   NEUTROABS 4.4 01/08/2024   HGB 10.2 (L) 01/08/2024   HCT 30.4 (L) 01/08/2024   MCV 95.9 01/08/2024   PLT 84 (L) 01/08/2024      Chemistry      Component  Value Date/Time   NA 141 01/08/2024 0933   K 3.5 01/08/2024 0933   CL 110 01/08/2024 0933   CO2 25 01/08/2024 0933   BUN 12 01/08/2024 0933   CREATININE 0.68 01/08/2024 0933   CREATININE 0.88 06/05/2021 1130      Component Value Date/Time   CALCIUM 8.7 (L) 01/08/2024 0933   ALKPHOS 151 (H) 01/08/2024 0933   AST 27 01/08/2024 0933   ALT 19 01/08/2024 0933   BILITOT 0.6 01/08/2024 0933       RADIOGRAPHIC STUDIES: I have personally reviewed the radiological images as listed and agreed with the findings in the report. No results found.

## 2024-01-18 NOTE — Assessment & Plan Note (Signed)
 [id number] with numerous small cavitary lesions in bilateral lungs, MMR normal  -diagnosed 07/2021, after incidental finding on CT scan for kidney stone, by colonoscopy/EUS. -Baseline CA 19-9 elevated to 273 on 07/03/21 -He began first-line mFOLFIRINOX on 08/13/21, but required dose reduction and move to every 3 weeks. -his CA 19-9 trended up but is now stable. -restaging CT CAP 08/12/22 showed: multiple new and enlarging pulmonary metastases; pancreatic mass grossly stable. Will repeat in 3 months. -we changed his irinotecan to liposomal on 09/05/22. He tolerated better and feels stronger. -Due to disease progression, his treatment was changed to gemcitabine and Abraxane every 2 weeks on 01/02/2023 -He tolerated the first two cycles chemotherapy very well with mild fatigue.  I increased gemcitabine to 1000mg /m2 from cycle 3 -Restaging CT scan 03/12/2023 showed stable disease overall. His primary pancreatic tumor is slightly increased in size, however his multiple cavitary lung nodules are slightly improved with thinner walls now.   -will continue current therapy.  He is overall tolerating well, but he has developed worsening fatigue lately.  I will reduce gemcitabine dose to 800 mg/m again. -His tumor marker CA 19.9 continues trending down, which is a good clinical sign. -restaging CT 07/22/2023 showed moderate disease progression at the primary site and lung mets, I personally reviewed the scan images with patient and his wife in detail. -I changed his chemo to liposomal irinotecan and 5-FU pump infusion every 2 weeks on 08/07/2023 with dose reduction due to fatigue  -restaging CT 10/22/23 showed progression in lungs  -will change chemo to dose reduced FOLFOX starting 11/26/23  -He understands the goal of chemotherapy is palliative, to prolong his life -refer for physical therapy to help build muscle strength and endurance.  01/20/2024  -cycle 5 day 1 FOLFOX.  Will order restaging CT CAP to review prior to cycle  6.

## 2024-01-19 ENCOUNTER — Telehealth: Payer: Self-pay

## 2024-01-19 ENCOUNTER — Other Ambulatory Visit: Payer: Self-pay

## 2024-01-19 NOTE — Progress Notes (Unsigned)
 Palliative Medicine [facility]  Telephone:([phone]) [phone] Fax:([phone]) [phone]   Name: Zachary Reid Date: 01/19/2024 MRN: [id number]  DOB: 07-16-1956  Patient Care Team: Lucky Cowboy, MD as PCP - General (Internal Medicine) Pollyann Samples, NP as PCP - Hematology/Oncology (Nurse Practitioner) Thomasene Ripple, DO as PCP - Cardiology (Cardiology) Nadara Mustard, MD as Consulting Physician (Orthopedic Surgery) Laurey Morale, MD as Consulting Physician (Cardiology) Malachy Mood, MD as Consulting Physician (Oncology) Pickenpack-Cousar, Arty Baumgartner, NP as Nurse Practitioner [facility] and Palliative Medicine)    INTERVAL HISTORY: Zachary Reid is a 68 y.o. male with oncologic medical history including pancreatic adenocarcinoma (06/2021), HLD, diabetes, CKD, HTN, and anxiety. Palliative ask to see for symptom management and goals of care.   SOCIAL HISTORY:     reports that he has never smoked. He has never used smokeless tobacco. He reports that he does not drink alcohol and does not use drugs.  ADVANCE DIRECTIVES:  Advanced directives on file naming wife, Zachary Reid, as patient healthcare power of attorney, and daughter, Zachary Reid as Counsellor.   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 tobb;  newly dx ED 10-31-2022 w/ RVR  in setting flank pain (kidney stone)  --  office note in epic  11-07-2022 normal ETT 11-13-2022, echo 11-07-2022 ef 65-70% w/ mild LVH, mild MR,  zio monitor not completed,  started on toprol and xarelto daily   Personal history of chemotherapy    Receiving chemotherapy for metastatic pancreatic cancer. Most recent infusion (as of 12/02/22) was on 11/15/22.   Port-A-Cath in place 08/10/2021   Type 2 diabetes mellitus treated with insulin [facility])    endocrinologist--- dr Lucianne Muss   Vitamin D deficiency    takes Vit D supplements   Wears contact lenses     ALLERGIES:  is allergic to lipitor [atorvastatin].  MEDICATIONS:  Current Outpatient Medications  Medication Sig Dispense Refill   ALPRAZolam (XANAX) 0.5 MG tablet Take 1 tablet (0.5 mg total) by mouth 3 (three) times daily as needed for anxiety. 60 tablet 1   baclofen (LIORESAL) 10 MG tablet Take 1 tablet (10 mg total) by mouth 3 (three) times daily as needed for muscle spasms. 30 each 2   chlorproMAZINE (THORAZINE) 25 MG tablet Take 1 tablet (25 mg total) by mouth 3 (three) times daily as needed. 60 tablet 2   Cholecalciferol (VITAMIN D3) 125 MCG (5000 UT) CAPS Take 5,000 Units by mouth in the morning and at bedtime.     Continuous Glucose Sensor (DEXCOM G7 SENSOR) MISC 1 Device by Does not apply route as directed. Change sensor every 10 days 3 each 3   diphenoxylate-atropine (LOMOTIL) 2.5-0.025 MG tablet Take 1-2 tablets by mouth 4 (four) times daily as needed for diarrhea or loose stools. (Patient taking differently: Take 1-2 tablets by mouth 4 (four) times  daily as needed for diarrhea or loose stools (as needed).) 30 tablet 0   gabapentin (NEURONTIN) 600 MG tablet Take 1 tablet (600 mg total) by mouth 3 (three) times daily as needed (pain). 90 tablet 0   glucose blood test strip Use as instructed 100 each 12   hyoscyamine (LEVSIN SL) 0.125 MG SL tablet DISSOLVE ONE TABLET UNDER THE TONGUE 4 TIMES DAILY UP TO EVERY 4 HOURS AS NEEDED FOR NAUSEA, BLOATING, CRAMPING, OR DIARRHEA 100 tablet 0   insulin  degludec (TRESIBA FLEXTOUCH) 100 UNIT/ML FlexTouch Pen INJECT 32 UNITS INTO THE SKIN DAILY. 15 mL 4   insulin lispro (HUMALOG KWIKPEN) 100 UNIT/ML KwikPen 10 TO 15 UNITS BEFORE MEALS AS DIRECTED 15 mL 0   lipase/protease/amylase (CREON) 36000 UNITS CPEP capsule Take 1 capsule (36,000 Units total) by mouth 3 (three) times daily with meals. May also take 1 capsule (36,000 Units total) as needed (with snacks - up to 4 snacks daily). (Patient not taking: Reported on 12/11/2023) 150 capsule 11   metoprolol succinate (TOPROL-XL) 25 MG 24 hr tablet TAKE 1/2 TABLET BY MOUTH EVERY DAY 45 tablet 3   morphine (MS CONTIN) 15 MG 12 hr tablet Take 1 tablet (15 mg total) by mouth every 12 (twelve) hours. 60 tablet 0   ondansetron (ZOFRAN) 8 MG tablet Take 1 tablet (8 mg total) by mouth every 8 (eight) hours as needed for nausea or vomiting. (Patient taking differently: Take 8 mg by mouth every 8 (eight) hours as needed for nausea or vomiting (as needed).) 60 tablet 1   oxyCODONE 10 MG TABS Take 1-2 tablets (10-20 mg total) by mouth every 4 (four) hours as needed for severe pain (pain score 7-10). 120 tablet 0   prochlorperazine (COMPAZINE) 10 MG tablet Take 1 tablet (10 mg total) by mouth every 6 (six) hours as needed (Nausea or vomiting). (Patient taking differently: Take 10 mg by mouth every 6 (six) hours as needed for nausea or vomiting (as needed).) 60 tablet 1   silver sulfADIAZINE (SILVADENE) 1 % cream Apply 1 Application topically daily. 20 g 0   zolpidem (AMBIEN) 10 MG tablet Take 1 tablet (10 mg total) by mouth at bedtime as needed. for sleep 60 tablet 0   No current facility-administered medications for this visit.    VITAL SIGNS: There were no vitals taken for this visit. There were no vitals filed for this visit.  Estimated body mass index is 26.64 kg/m as calculated from the following:   Height as of 10/01/23: 5\' 11"  (1.803 m).   Weight as of 01/08/24: 191 lb (86.6 kg).   PERFORMANCE STATUS (ECOG)  : 1 - Symptomatic but completely ambulatory  Assessment NAD RRR Normal breathing pattern AAO x3  Discussed the use of AI scribe software for clinical note transcription with the patient, who gave verbal consent to proceed.   IMPRESSION:  Zachary Reid is a 68 year old male who presents with persistent hiccups and pain management follow-up. No acute distress. He is doing well overall. Denies concerns with nausea, vomiting, or diarrhea. He has a history of constipation associated with his pain medication, which was severe during a previous episode but is not currently experiencing it. He is not taking Creon, prescribed for digestive issues, and is unsure if he has any left. He has not experienced recent digestive symptoms such as cramping or tightness in the stomach. Constipation is controlled with stool softeners as needed. Occasional fatigue however is able to remain active. Reports concerns of weakness  and atrophy.   Zachary Reid is reporting persistent hiccups that began after his last chemotherapy session. Initially, the hiccups were continuous throughout the day and night during the first week. In the second week, the frequency decreased, and he has not experienced any hiccups this morning. Hiccups did start during his infusion later today. He was previously taking baclofen for spasms with addition of helping with hiccups. His hiccups will stop for a period of time if he gags. He has not yet started Thorazine intended to address the hiccups but notes that taking his previous medication more regularly seemed to help reduce the frequency. Education provided also on the use of his gabapentin however he has only been taking as needed. Recommended restarting around the clock to assist with his pain and hiccups.   We discussed his pain at length. He experiences pain that reaches a level of 7-8 out of 10 at its worst, managed with oxycodone IR as needed. He previously tried morphine er 15mg  but  self-discontinued it due to hallucinations at night. He describes symptom as "dream enhancing and more vivid content" Oxycodone, taken as needed, reduces his pain to a level of 3 out of 10 however pain becomes more intense within several hours. Of note patient is also now taking Ambien at bedtime for insomnia. Education provided on the use of Ambien and MS Contin around the same time which most likely contributed to his hallucinations as patient tolerates morning dose without difficulty. Advise to restart and schedule his MS Contin at least 3-4 hours prior to use of Ambien. If symptoms continue, we will adjust considering other options. Zachary Reid reports he is taking oxycodone several times a day and his body lets him know that it is time due to intensity of the pain.   We will continue to closely monitor and support.  Assessment and Plan  Hiccups Persistent hiccups, possibly related to chemotherapy. Some improvement noted with regular use of Baclofen. New medication (Thorazine) not yet started. Gabapentin, currently taken as needed, may also help. -Start Thorazine as prescribed. -Restart Gabapentin to twice daily. -Contact medical team if no improvement or if side effects occur.  Cancer Related Pain Persistent pain, rated up to 7/10, managed with Oxycodone IR, however requiring more frequently around the clock. He self-discontinued use MS Contin due to hallucinations, particularly when taken close to Ambien. -Resume MS Contin 15mg  every 12 hours, adjusting timing to avoid close administration with Ambien. -Continue Oxycodone as needed.   Muscle Weakness Global weakness, possibly due to muscle atrophy. Difficulty with gait and lifting objects. -Refer to outpatient physical therapy for strength training and gait improvement per oncology.  Medication Management Difficulty keeping track of multiple medications. Creon possibly discontinued inadvertently. -Refill Gabapentin and Oxycodone  prescriptions. -Refill Creon prescription and advise to restart for improved digestion and possible bowel regularity.  Follow-up Next appointment scheduled for March 4th, 2025. -Plan to review in infusion on March 4th, 2025. -Encourage patient to contact office if any changes occur before then.  Patient expressed understanding and was in agreement with this plan. He also understands that He can call the clinic at any time with any questions, concerns, or complaints.   Any controlled substances utilized were prescribed in the context of palliative care. PDMP has been reviewed.   Visit consisted of counseling and education dealing with the complex and emotionally intense issues of symptom management and palliative care in the setting of serious and potentially life-threatening illness.  Willette Alma, AGPCNP-BC  Palliative Medicine Team/[facility]

## 2024-01-19 NOTE — Telephone Encounter (Signed)
 Responded to pt's voicemail regarding appt schedule.  Stated that the pt's appts were scheduled based on the Infusion Clinic's availability this week to do the pt's FOLFOX.  Stated that the infusion is scheduled on 01/20/2024 and based off pt's tx planned for 01/21/2024 but since infusion did not have availability on 01/21/2024 the pt was scheduled on 01/20/2024 which is not too off from the planned tx date of 01/21/2024.  Pt also indicated in voicemail that he usually was schedule on Thursdays with pump d/c on Saturdays.  Instructed pt when he returns to [facility] to speak with the Scheduling Team to see if the pt's schedule could be modified if Infusion has availability.  Stated pt could contact Dr. Latanya Maudlin office should he have additional questions or concerns.

## 2024-01-20 ENCOUNTER — Inpatient Hospital Stay: Payer: Medicare Other | Admitting: Nurse Practitioner

## 2024-01-20 ENCOUNTER — Encounter: Payer: Self-pay | Admitting: Hematology

## 2024-01-20 ENCOUNTER — Inpatient Hospital Stay: Payer: Medicare Other | Attending: Hematology

## 2024-01-20 ENCOUNTER — Inpatient Hospital Stay (HOSPITAL_BASED_OUTPATIENT_CLINIC_OR_DEPARTMENT_OTHER): Payer: Medicare Other | Admitting: Hematology

## 2024-01-20 ENCOUNTER — Encounter: Payer: Self-pay | Admitting: Nurse Practitioner

## 2024-01-20 ENCOUNTER — Inpatient Hospital Stay: Payer: Medicare Other

## 2024-01-20 VITALS — BP 136/78 | HR 83 | Temp 97.6°F | Resp 17 | Wt 186.6 lb

## 2024-01-20 DIAGNOSIS — Z515 Encounter for palliative care: Secondary | ICD-10-CM | POA: Diagnosis not present

## 2024-01-20 DIAGNOSIS — I82891 Chronic embolism and thrombosis of other specified veins: Secondary | ICD-10-CM | POA: Insufficient documentation

## 2024-01-20 DIAGNOSIS — N400 Enlarged prostate without lower urinary tract symptoms: Secondary | ICD-10-CM | POA: Insufficient documentation

## 2024-01-20 DIAGNOSIS — I2510 Atherosclerotic heart disease of native coronary artery without angina pectoris: Secondary | ICD-10-CM | POA: Diagnosis not present

## 2024-01-20 DIAGNOSIS — Z79899 Other long term (current) drug therapy: Secondary | ICD-10-CM | POA: Insufficient documentation

## 2024-01-20 DIAGNOSIS — I81 Portal vein thrombosis: Secondary | ICD-10-CM | POA: Insufficient documentation

## 2024-01-20 DIAGNOSIS — Z794 Long term (current) use of insulin: Secondary | ICD-10-CM | POA: Insufficient documentation

## 2024-01-20 DIAGNOSIS — Z79891 Long term (current) use of opiate analgesic: Secondary | ICD-10-CM | POA: Insufficient documentation

## 2024-01-20 DIAGNOSIS — G4700 Insomnia, unspecified: Secondary | ICD-10-CM | POA: Insufficient documentation

## 2024-01-20 DIAGNOSIS — Z9221 Personal history of antineoplastic chemotherapy: Secondary | ICD-10-CM | POA: Insufficient documentation

## 2024-01-20 DIAGNOSIS — G893 Neoplasm related pain (acute) (chronic): Secondary | ICD-10-CM | POA: Insufficient documentation

## 2024-01-20 DIAGNOSIS — I8500 Esophageal varices without bleeding: Secondary | ICD-10-CM | POA: Insufficient documentation

## 2024-01-20 DIAGNOSIS — C259 Malignant neoplasm of pancreas, unspecified: Secondary | ICD-10-CM | POA: Diagnosis not present

## 2024-01-20 DIAGNOSIS — C787 Secondary malignant neoplasm of liver and intrahepatic bile duct: Secondary | ICD-10-CM | POA: Diagnosis not present

## 2024-01-20 DIAGNOSIS — R066 Hiccough: Secondary | ICD-10-CM | POA: Diagnosis not present

## 2024-01-20 DIAGNOSIS — Z87442 Personal history of urinary calculi: Secondary | ICD-10-CM | POA: Insufficient documentation

## 2024-01-20 DIAGNOSIS — Z8546 Personal history of malignant neoplasm of prostate: Secondary | ICD-10-CM | POA: Insufficient documentation

## 2024-01-20 DIAGNOSIS — R590 Localized enlarged lymph nodes: Secondary | ICD-10-CM | POA: Diagnosis not present

## 2024-01-20 DIAGNOSIS — Z5111 Encounter for antineoplastic chemotherapy: Secondary | ICD-10-CM | POA: Insufficient documentation

## 2024-01-20 DIAGNOSIS — I480 Paroxysmal atrial fibrillation: Secondary | ICD-10-CM | POA: Diagnosis not present

## 2024-01-20 DIAGNOSIS — I700 Atherosclerosis of aorta: Secondary | ICD-10-CM | POA: Diagnosis not present

## 2024-01-20 DIAGNOSIS — R5383 Other fatigue: Secondary | ICD-10-CM | POA: Diagnosis not present

## 2024-01-20 DIAGNOSIS — E785 Hyperlipidemia, unspecified: Secondary | ICD-10-CM | POA: Insufficient documentation

## 2024-01-20 DIAGNOSIS — E1142 Type 2 diabetes mellitus with diabetic polyneuropathy: Secondary | ICD-10-CM | POA: Insufficient documentation

## 2024-01-20 DIAGNOSIS — F411 Generalized anxiety disorder: Secondary | ICD-10-CM | POA: Insufficient documentation

## 2024-01-20 DIAGNOSIS — K7689 Other specified diseases of liver: Secondary | ICD-10-CM | POA: Diagnosis not present

## 2024-01-20 DIAGNOSIS — R188 Other ascites: Secondary | ICD-10-CM | POA: Insufficient documentation

## 2024-01-20 DIAGNOSIS — C7972 Secondary malignant neoplasm of left adrenal gland: Secondary | ICD-10-CM | POA: Diagnosis not present

## 2024-01-20 DIAGNOSIS — N132 Hydronephrosis with renal and ureteral calculous obstruction: Secondary | ICD-10-CM | POA: Insufficient documentation

## 2024-01-20 DIAGNOSIS — Z95828 Presence of other vascular implants and grafts: Secondary | ICD-10-CM

## 2024-01-20 DIAGNOSIS — R530 Neoplastic (malignant) related fatigue: Secondary | ICD-10-CM

## 2024-01-20 DIAGNOSIS — R2681 Unsteadiness on feet: Secondary | ICD-10-CM | POA: Insufficient documentation

## 2024-01-20 DIAGNOSIS — C7800 Secondary malignant neoplasm of unspecified lung: Secondary | ICD-10-CM | POA: Diagnosis not present

## 2024-01-20 DIAGNOSIS — E860 Dehydration: Secondary | ICD-10-CM

## 2024-01-20 DIAGNOSIS — E559 Vitamin D deficiency, unspecified: Secondary | ICD-10-CM | POA: Insufficient documentation

## 2024-01-20 DIAGNOSIS — R600 Localized edema: Secondary | ICD-10-CM | POA: Diagnosis not present

## 2024-01-20 DIAGNOSIS — Z860101 Personal history of adenomatous and serrated colon polyps: Secondary | ICD-10-CM | POA: Insufficient documentation

## 2024-01-20 DIAGNOSIS — R110 Nausea: Secondary | ICD-10-CM

## 2024-01-20 LAB — CBC WITH DIFFERENTIAL (CANCER CENTER ONLY)
Abs Immature Granulocytes: 0.03 10*3/uL (ref 0.00–0.07)
Basophils Absolute: 0 10*3/uL (ref 0.0–0.1)
Basophils Relative: 0 %
Eosinophils Absolute: 0.4 10*3/uL (ref 0.0–0.5)
Eosinophils Relative: 5 %
HCT: 28.9 % — ABNORMAL LOW (ref 39.0–52.0)
Hemoglobin: 9.7 g/dL — ABNORMAL LOW (ref 13.0–17.0)
Immature Granulocytes: 0 %
Lymphocytes Relative: 13 %
Lymphs Abs: 1 10*3/uL (ref 0.7–4.0)
MCH: 32 pg (ref 26.0–34.0)
MCHC: 33.6 g/dL (ref 30.0–36.0)
MCV: 95.4 fL (ref 80.0–100.0)
Monocytes Absolute: 0.8 10*3/uL (ref 0.1–1.0)
Monocytes Relative: 10 %
Neutro Abs: 5.6 10*3/uL (ref 1.7–7.7)
Neutrophils Relative %: 72 %
Platelet Count: 88 10*3/uL — ABNORMAL LOW (ref 150–400)
RBC: 3.03 MIL/uL — ABNORMAL LOW (ref 4.22–5.81)
RDW: 14.1 % (ref 11.5–15.5)
WBC Count: 7.9 10*3/uL (ref 4.0–10.5)
nRBC: 0 % (ref 0.0–0.2)

## 2024-01-20 LAB — CMP (CANCER CENTER ONLY)
ALT: 17 U/L (ref 0–44)
AST: 27 U/L (ref 15–41)
Albumin: 3.4 g/dL — ABNORMAL LOW (ref 3.5–5.0)
Alkaline Phosphatase: 168 U/L — ABNORMAL HIGH (ref 38–126)
Anion gap: 6 (ref 5–15)
BUN: 13 mg/dL (ref 8–23)
CO2: 24 mmol/L (ref 22–32)
Calcium: 8.6 mg/dL — ABNORMAL LOW (ref 8.9–10.3)
Chloride: 110 mmol/L (ref 98–111)
Creatinine: 0.76 mg/dL (ref 0.61–1.24)
GFR, Estimated: >60 mL/min (ref >60)
Glucose, Bld: 95 mg/dL (ref 70–99)
Potassium: 3.8 mmol/L (ref 3.5–5.1)
Sodium: 140 mmol/L (ref 135–145)
Total Bilirubin: 0.7 mg/dL (ref 0.0–1.2)
Total Protein: 6.2 g/dL — ABNORMAL LOW (ref 6.5–8.1)

## 2024-01-20 MED ORDER — SODIUM CHLORIDE 0.9 % IV SOLN
1800.0000 mg/m2 | INTRAVENOUS | Status: DC
  Administered 2024-01-20: 3500 mg via INTRAVENOUS
  Filled 2024-01-20: qty 70

## 2024-01-20 MED ORDER — ALPRAZOLAM 0.5 MG PO TABS
0.5000 mg | ORAL_TABLET | Freq: Three times a day (TID) | ORAL | 0 refills | Status: DC | PRN
Start: 2024-01-20 — End: 2024-03-16

## 2024-01-20 MED ORDER — PALONOSETRON HCL INJECTION 0.25 MG/5ML
0.2500 mg | Freq: Once | INTRAVENOUS | Status: AC
  Administered 2024-01-20: 0.25 mg via INTRAVENOUS
  Filled 2024-01-20: qty 5

## 2024-01-20 MED ORDER — SODIUM CHLORIDE 0.9% FLUSH
10.0000 mL | Freq: Once | INTRAVENOUS | Status: AC
  Administered 2024-01-20: 10 mL

## 2024-01-20 MED ORDER — LEUCOVORIN CALCIUM INJECTION 350 MG
400.0000 mg/m2 | Freq: Once | INTRAMUSCULAR | Status: AC
  Administered 2024-01-20: 844 mg via INTRAVENOUS
  Filled 2024-01-20: qty 25

## 2024-01-20 MED ORDER — DEXTROSE 5 % IV SOLN: INTRAVENOUS | Status: DC

## 2024-01-20 MED ORDER — SODIUM CHLORIDE 0.9% FLUSH
10.0000 mL | INTRAVENOUS | Status: DC | PRN
  Administered 2024-01-20: 10 mL

## 2024-01-20 MED ORDER — OXALIPLATIN CHEMO INJECTION 100 MG/20ML
50.0000 mg/m2 | Freq: Once | INTRAVENOUS | Status: AC
  Administered 2024-01-20: 100 mg via INTRAVENOUS
  Filled 2024-01-20: qty 20

## 2024-01-20 NOTE — Patient Instructions (Signed)
 [facility] - A DEPT OF [facility]  Discharge Instructions: Thank you for choosing [facility] to provide your oncology and hematology care.   If you have a lab appointment with the Cancer Center, please go directly to the Cancer Center and check in at the registration area.   Wear comfortable clothing and clothing appropriate for easy access to any Portacath or PICC line.   We strive to give you quality time with your provider. You may need to reschedule your appointment if you arrive late (15 or more minutes).  Arriving late affects you and other patients whose appointments are after yours.  Also, if you miss three or more appointments without notifying the office, you may be dismissed from the clinic at the provider's discretion.      For prescription refill requests, have your pharmacy contact our office and allow 72 hours for refills to be completed.    Today you received the following chemotherapy and/or immunotherapy agents: Oxaliplatin/Leucovorin/5FU      To help prevent nausea and vomiting after your treatment, we encourage you to take your nausea medication as directed.  BELOW ARE SYMPTOMS THAT SHOULD BE REPORTED IMMEDIATELY: *FEVER GREATER THAN 100.4 F (38 C) OR HIGHER *CHILLS OR SWEATING *NAUSEA AND VOMITING THAT IS NOT CONTROLLED WITH YOUR NAUSEA MEDICATION *UNUSUAL SHORTNESS OF BREATH *UNUSUAL BRUISING OR BLEEDING *URINARY PROBLEMS (pain or burning when urinating, or frequent urination) *BOWEL PROBLEMS (unusual diarrhea, constipation, pain near the anus) TENDERNESS IN MOUTH AND THROAT WITH OR WITHOUT PRESENCE OF ULCERS (sore throat, sores in mouth, or a toothache) UNUSUAL RASH, SWELLING OR PAIN  UNUSUAL VAGINAL DISCHARGE OR ITCHING   Items with * indicate a potential emergency and should be followed up as soon as possible or go to the Emergency Department if any problems should occur.  Please show the CHEMOTHERAPY ALERT CARD  or IMMUNOTHERAPY ALERT CARD at check-in to the Emergency Department and triage nurse.  Should you have questions after your visit or need to cancel or reschedule your appointment, please contact [facility] - A DE[facility]  Dept: [phone]  and follow the prompts.  Office hours are 8:00 a.m. to 4:30 p.m. Monday - Friday. Please note that voicemails left after 4:00 p.m. may not be returned until the following business day.  We are closed weekends and major holidays. You have access to a nurse at all times for urgent questions. Please call the main number to the clinic Dept: [phone] and follow the prompts.   For any non-urgent questions, you may also contact your provider using MyChart. We now offer e-Visits for anyone 14 and older to request care online for non-urgent symptoms. For details visit [email or web address].   Also download the MyChart app! Go to the app store, search "MyChart", open the app, select [facility], and log in with your MyChart username and password.

## 2024-01-21 ENCOUNTER — Encounter: Payer: Self-pay | Admitting: Hematology

## 2024-01-21 ENCOUNTER — Other Ambulatory Visit: Payer: Self-pay

## 2024-01-21 DIAGNOSIS — C259 Malignant neoplasm of pancreas, unspecified: Secondary | ICD-10-CM

## 2024-01-22 ENCOUNTER — Inpatient Hospital Stay: Payer: Medicare Other

## 2024-01-22 VITALS — BP 116/68 | HR 83 | Temp 98.2°F | Resp 18

## 2024-01-22 DIAGNOSIS — E860 Dehydration: Secondary | ICD-10-CM

## 2024-01-22 DIAGNOSIS — Z5111 Encounter for antineoplastic chemotherapy: Secondary | ICD-10-CM | POA: Diagnosis not present

## 2024-01-22 DIAGNOSIS — Z95828 Presence of other vascular implants and grafts: Secondary | ICD-10-CM

## 2024-01-22 MED ORDER — SODIUM CHLORIDE 0.9% FLUSH
10.0000 mL | Freq: Once | INTRAVENOUS | Status: AC
Start: 2024-01-22 — End: 2024-01-22
  Administered 2024-01-22: 10 mL

## 2024-01-22 MED ORDER — HEPARIN SOD (PORK) LOCK FLUSH 100 UNIT/ML IV SOLN
500.0000 [IU] | Freq: Once | INTRAVENOUS | Status: AC
Start: 2024-01-22 — End: 2024-01-22
  Administered 2024-01-22: 500 [IU]

## 2024-01-26 ENCOUNTER — Telehealth: Payer: Self-pay

## 2024-01-26 NOTE — Telephone Encounter (Signed)
 Pt called over the weekend and LVM reporting an increase in pain. RN called pt and pain is now under control with use of pain medications per pt and wife. Pt also reports new  swelling bilaterally to feet and ankles, reports no pain or redness, no neuropathy, pt reports that he was sitting most of the weekend but when he elevated his feet swelling improved. RN discussed s/s of dependent edema, how to manage and home and when to call back with changes. Pt verbalized understanding, no further needs at this time.

## 2024-01-28 ENCOUNTER — Encounter: Payer: Self-pay | Admitting: Hematology

## 2024-01-28 ENCOUNTER — Other Ambulatory Visit: Payer: Self-pay

## 2024-01-28 ENCOUNTER — Other Ambulatory Visit (HOSPITAL_COMMUNITY): Payer: Self-pay

## 2024-01-28 DIAGNOSIS — Z515 Encounter for palliative care: Secondary | ICD-10-CM

## 2024-01-28 DIAGNOSIS — G893 Neoplasm related pain (acute) (chronic): Secondary | ICD-10-CM

## 2024-01-28 DIAGNOSIS — C259 Malignant neoplasm of pancreas, unspecified: Secondary | ICD-10-CM

## 2024-01-28 MED ORDER — OXYCODONE HCL 10 MG PO TABS
10.0000 mg | ORAL_TABLET | ORAL | 0 refills | Status: DC | PRN
  Filled 2024-01-28: qty 120, 10d supply, fill #0

## 2024-01-28 MED ORDER — OXYCODONE HCL 10 MG PO TABS: 10.0000 mg | ORAL_TABLET | ORAL | 0 refills | Status: DC | PRN

## 2024-01-28 NOTE — Telephone Encounter (Signed)
 Pt called asking for refill to be moved to [facility] d/t preferred pharmacy being out of stock. See associated orders.

## 2024-01-28 NOTE — Telephone Encounter (Signed)
 Pt called for medication refill, see associated orders

## 2024-02-03 ENCOUNTER — Ambulatory Visit (HOSPITAL_COMMUNITY)
Admission: RE | Admit: 2024-02-03 | Discharge: 2024-02-03 | Disposition: A | Discharge status: Home / Self Care | Source: Ambulatory Visit | Attending: Hematology | Admitting: Hematology

## 2024-02-03 DIAGNOSIS — C259 Malignant neoplasm of pancreas, unspecified: Secondary | ICD-10-CM | POA: Diagnosis present

## 2024-02-03 MED ORDER — IOHEXOL 300 MG/ML  SOLN
100.0000 mL | Freq: Once | INTRAMUSCULAR | Status: AC | PRN
  Administered 2024-02-03: 100 mL via INTRAVENOUS

## 2024-02-03 MED ORDER — HEPARIN SOD (PORK) LOCK FLUSH 100 UNIT/ML IV SOLN
500.0000 [IU] | Freq: Once | INTRAVENOUS | Status: AC
  Administered 2024-02-03: 500 [IU] via INTRAVENOUS

## 2024-02-03 MED ORDER — HEPARIN SOD (PORK) LOCK FLUSH 100 UNIT/ML IV SOLN
INTRAVENOUS | Status: AC
  Filled 2024-02-03: qty 5

## 2024-02-03 NOTE — Assessment & Plan Note (Signed)
 [id number] with numerous small cavitary lesions in bilateral lungs, MMR normal  -diagnosed 07/2021, after incidental finding on CT scan for kidney stone, by colonoscopy/EUS. -Baseline CA 19-9 elevated to 273 on 07/03/21 -He began first-line mFOLFIRINOX on 08/13/21, but required dose reduction and move to every 3 weeks. -his CA 19-9 trended up but is now stable. -restaging CT CAP 08/12/22 showed: multiple new and enlarging pulmonary metastases; pancreatic mass grossly stable. Will repeat in 3 months. -we changed his irinotecan to liposomal on 09/05/22. He tolerated better and feels stronger. -Due to disease progression, his treatment was changed to gemcitabine and Abraxane every 2 weeks on 01/02/2023 -He tolerated the first two cycles chemotherapy very well with mild fatigue.  I increased gemcitabine to 1000mg /m2 from cycle 3 -Restaging CT scan 03/12/2023 showed stable disease overall. His primary pancreatic tumor is slightly increased in size, however his multiple cavitary lung nodules are slightly improved with thinner walls now.   -will continue current therapy.  He is overall tolerating well, but he has developed worsening fatigue lately.  I will reduce gemcitabine dose to 800 mg/m again. -His tumor marker CA 19.9 continues trending down, which is a good clinical sign. -restaging CT 07/22/2023 showed moderate disease progression at the primary site and lung mets, I personally reviewed the scan images with patient and his wife in detail. -I changed his chemo to liposomal irinotecan and 5-FU pump infusion every 2 weeks on 08/07/2023 with dose reduction due to fatigue  -restaging CT 10/22/23 showed progression in lungs  -will change chemo to dose reduced FOLFOX starting 11/26/23  -He understands the goal of chemotherapy is palliative, to prolong his life. -- Improved side effects with reduced dose FOLFOX.

## 2024-02-04 ENCOUNTER — Inpatient Hospital Stay: Payer: Medicare Other

## 2024-02-04 ENCOUNTER — Encounter: Payer: Self-pay | Admitting: Hematology

## 2024-02-04 ENCOUNTER — Encounter: Payer: Self-pay | Admitting: Nurse Practitioner

## 2024-02-04 ENCOUNTER — Other Ambulatory Visit (HOSPITAL_COMMUNITY): Payer: Self-pay

## 2024-02-04 ENCOUNTER — Inpatient Hospital Stay: Admitting: Dietician

## 2024-02-04 ENCOUNTER — Other Ambulatory Visit (HOSPITAL_BASED_OUTPATIENT_CLINIC_OR_DEPARTMENT_OTHER): Payer: Self-pay

## 2024-02-04 ENCOUNTER — Inpatient Hospital Stay (HOSPITAL_BASED_OUTPATIENT_CLINIC_OR_DEPARTMENT_OTHER): Admitting: Nurse Practitioner

## 2024-02-04 ENCOUNTER — Other Ambulatory Visit: Payer: Self-pay

## 2024-02-04 ENCOUNTER — Inpatient Hospital Stay (HOSPITAL_BASED_OUTPATIENT_CLINIC_OR_DEPARTMENT_OTHER): Payer: Medicare Other | Admitting: Hematology

## 2024-02-04 VITALS — BP 98/62 | HR 103 | Temp 98.0°F | Resp 21 | Ht 71.0 in | Wt 194.2 lb

## 2024-02-04 DIAGNOSIS — Z7189 Other specified counseling: Secondary | ICD-10-CM

## 2024-02-04 DIAGNOSIS — C259 Malignant neoplasm of pancreas, unspecified: Secondary | ICD-10-CM | POA: Diagnosis not present

## 2024-02-04 DIAGNOSIS — Z515 Encounter for palliative care: Secondary | ICD-10-CM

## 2024-02-04 DIAGNOSIS — Z95828 Presence of other vascular implants and grafts: Secondary | ICD-10-CM

## 2024-02-04 DIAGNOSIS — G893 Neoplasm related pain (acute) (chronic): Secondary | ICD-10-CM

## 2024-02-04 DIAGNOSIS — Z5111 Encounter for antineoplastic chemotherapy: Secondary | ICD-10-CM | POA: Diagnosis not present

## 2024-02-04 DIAGNOSIS — E860 Dehydration: Secondary | ICD-10-CM

## 2024-02-04 DIAGNOSIS — R530 Neoplastic (malignant) related fatigue: Secondary | ICD-10-CM

## 2024-02-04 LAB — CMP (CANCER CENTER ONLY)
ALT: 14 U/L (ref 0–44)
AST: 37 U/L (ref 15–41)
Albumin: 3 g/dL — ABNORMAL LOW (ref 3.5–5.0)
Alkaline Phosphatase: 163 U/L — ABNORMAL HIGH (ref 38–126)
Anion gap: 6 (ref 5–15)
BUN: 15 mg/dL (ref 8–23)
CO2: 25 mmol/L (ref 22–32)
Calcium: 8.4 mg/dL — ABNORMAL LOW (ref 8.9–10.3)
Chloride: 109 mmol/L (ref 98–111)
Creatinine: 0.78 mg/dL (ref 0.61–1.24)
GFR, Estimated: >60 mL/min (ref >60)
Glucose, Bld: 115 mg/dL — ABNORMAL HIGH (ref 70–99)
Potassium: 3.5 mmol/L (ref 3.5–5.1)
Sodium: 140 mmol/L (ref 135–145)
Total Bilirubin: 1.1 mg/dL (ref 0.0–1.2)
Total Protein: 5.8 g/dL — ABNORMAL LOW (ref 6.5–8.1)

## 2024-02-04 LAB — CBC WITH DIFFERENTIAL (CANCER CENTER ONLY)
Abs Immature Granulocytes: 0.04 10*3/uL (ref 0.00–0.07)
Basophils Absolute: 0 10*3/uL (ref 0.0–0.1)
Basophils Relative: 0 %
Eosinophils Absolute: 0.3 10*3/uL (ref 0.0–0.5)
Eosinophils Relative: 5 %
HCT: 29.6 % — ABNORMAL LOW (ref 39.0–52.0)
Hemoglobin: 9.7 g/dL — ABNORMAL LOW (ref 13.0–17.0)
Immature Granulocytes: 1 %
Lymphocytes Relative: 11 %
Lymphs Abs: 0.8 10*3/uL (ref 0.7–4.0)
MCH: 31.5 pg (ref 26.0–34.0)
MCHC: 32.8 g/dL (ref 30.0–36.0)
MCV: 96.1 fL (ref 80.0–100.0)
Monocytes Absolute: 0.9 10*3/uL (ref 0.1–1.0)
Monocytes Relative: 12 %
Neutro Abs: 5 10*3/uL (ref 1.7–7.7)
Neutrophils Relative %: 71 %
Platelet Count: 110 10*3/uL — ABNORMAL LOW (ref 150–400)
RBC: 3.08 MIL/uL — ABNORMAL LOW (ref 4.22–5.81)
RDW: 15.9 % — ABNORMAL HIGH (ref 11.5–15.5)
WBC Count: 7 10*3/uL (ref 4.0–10.5)
nRBC: 0 % (ref 0.0–0.2)

## 2024-02-04 MED ORDER — XTAMPZA ER 9 MG PO C12A
9.0000 mg | EXTENDED_RELEASE_CAPSULE | Freq: Two times a day (BID) | ORAL | 0 refills | Status: DC
Start: 2024-02-04 — End: 2024-02-04
  Filled 2024-02-04: qty 60, 30d supply, fill #0

## 2024-02-04 MED ORDER — FENTANYL 12 MCG/HR TD PT72
1.0000 | MEDICATED_PATCH | TRANSDERMAL | 0 refills | Status: DC
  Filled 2024-02-04: qty 10, 30d supply, fill #0

## 2024-02-04 MED ORDER — OXYCODONE HCL ER 10 MG PO T12A
10.0000 mg | EXTENDED_RELEASE_TABLET | Freq: Two times a day (BID) | ORAL | 0 refills | Status: DC
  Filled 2024-02-04: qty 60, 30d supply, fill #0

## 2024-02-04 MED ORDER — SODIUM CHLORIDE 0.9% FLUSH
10.0000 mL | Freq: Once | INTRAVENOUS | Status: AC
  Administered 2024-02-04: 10 mL

## 2024-02-04 NOTE — Progress Notes (Signed)
 Referral made to [facility], called and spoke with coordinating RN, provided necessary information for referral, no further needs at this time.

## 2024-02-04 NOTE — Progress Notes (Signed)
 Palliative Medicine [facility]  [phone] [phone]   Name: Zachary Reid Date: 02/04/2024 MRN: [id number]  DOB: [birth year]  Patient Care Team: Lucky Cowboy, MD as PCP - General (Internal Medicine) Pollyann Samples, NP as PCP - Hematology/Oncology (Nurse Practitioner) Thomasene Ripple, DO as PCP - Cardiology (Cardiology) Nadara Mustard, MD as Consulting Physician (Orthopedic Surgery) Laurey Morale, MD as Consulting Physician (Cardiology) Malachy Mood, MD as Consulting Physician (Oncology) Pickenpack-Cousar, Arty Baumgartner, NP as Nurse Practitioner [facility] and Palliative Medicine)    INTERVAL HISTORY: Zachary Reid is a 68 y.o. male with oncologic medical history including pancreatic adenocarcinoma (06/2021), HLD, diabetes, CKD, HTN, and anxiety. Palliative ask to see for symptom management and goals of care.   SOCIAL HISTORY:     reports that he has never smoked. He has never used smokeless tobacco. He reports that he does not drink alcohol and does not use drugs.  ADVANCE DIRECTIVES:  Advanced directives on file naming wife, Zachary Reid, as patient healthcare power of attorney, and daughter, Zachary Reid as Counsellor.   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 tobb;  newly dx ED 10-31-2022 w/ RVR  in setting flank pain (kidney stone)  --  office note in  epic 11-07-2022 normal ETT 11-13-2022, echo 11-07-2022 ef 65-70% w/ mild LVH, mild MR,  zio monitor not completed,  started on toprol and xarelto daily   Personal history of chemotherapy    Receiving chemotherapy for metastatic pancreatic cancer. Most recent infusion (as of 12/02/22) was on 11/15/22.   Port-A-Cath in place 08/10/2021   Type 2 diabetes mellitus treated with insulin [facility])    endocrinologist--- dr Lucianne Muss   Vitamin D deficiency    takes Vit D supplements   Wears contact lenses     ALLERGIES:  is allergic to lipitor [atorvastatin].  MEDICATIONS:  Current Outpatient Medications  Medication Sig Dispense Refill   ALPRAZolam (XANAX) 0.5 MG tablet Take 1 tablet (0.5 mg total) by mouth 3 (three) times daily as needed for anxiety. 60 tablet 0   baclofen (LIORESAL) 10 MG tablet Take 1 tablet (10 mg total) by mouth 3 (three) times daily as needed for muscle spasms. 30 each 2   chlorproMAZINE (THORAZINE) 25 MG tablet Take 1 tablet (25 mg total) by mouth 3 (three) times daily as needed. 60 tablet 2   Cholecalciferol (VITAMIN D3) 125 MCG (5000 UT) CAPS Take 5,000 Units by mouth in the morning and at bedtime.     Continuous Glucose Sensor (DEXCOM G7 SENSOR) MISC 1 Device by Does not apply route as directed. Change sensor every 10 days 3 each 3   diphenoxylate-atropine (LOMOTIL) 2.5-0.025 MG tablet Take 1-2 tablets by mouth 4 (four) times daily as needed for diarrhea or loose stools. (Patient taking differently: Take 1-2 tablets by mouth 4 (four) times  daily as needed for diarrhea or loose stools (as needed).) 30 tablet 0   gabapentin (NEURONTIN) 600 MG tablet Take 1 tablet (600 mg total) by mouth 3 (three) times daily as needed (pain). 90 tablet 0   glucose blood test strip Use as instructed 100 each 12   hyoscyamine (LEVSIN SL) 0.125 MG SL tablet DISSOLVE ONE TABLET UNDER THE TONGUE 4 TIMES DAILY UP TO EVERY 4 HOURS AS NEEDED FOR NAUSEA, BLOATING, CRAMPING, OR DIARRHEA 100 tablet 0   insulin  degludec (TRESIBA FLEXTOUCH) 100 UNIT/ML FlexTouch Pen INJECT 32 UNITS INTO THE SKIN DAILY. 15 mL 4   insulin lispro (HUMALOG KWIKPEN) 100 UNIT/ML KwikPen 10 TO 15 UNITS BEFORE MEALS AS DIRECTED 15 mL 0   lipase/protease/amylase (CREON) 36000 UNITS CPEP capsule Take 1 capsule (36,000 Units total) by mouth 3 (three) times daily with meals. May also take 1 capsule (36,000 Units total) as needed (with snacks - up to 4 snacks daily). 150 capsule 11   metoprolol succinate (TOPROL-XL) 25 MG 24 hr tablet TAKE 1/2 TABLET BY MOUTH EVERY DAY 45 tablet 3   ondansetron (ZOFRAN) 8 MG tablet Take 1 tablet (8 mg total) by mouth every 8 (eight) hours as needed for nausea or vomiting. (Patient taking differently: Take 8 mg by mouth every 8 (eight) hours as needed for nausea or vomiting (as needed).) 60 tablet 1   Oxycodone HCl 10 MG TABS Take 1-2 tablets (10-20 mg total) by mouth every 4 (four) hours as needed. 120 tablet 0   prochlorperazine (COMPAZINE) 10 MG tablet Take 1 tablet (10 mg total) by mouth every 6 (six) hours as needed (Nausea or vomiting). (Patient taking differently: Take 10 mg by mouth every 6 (six) hours as needed for nausea or vomiting (as needed).) 60 tablet 1   silver sulfADIAZINE (SILVADENE) 1 % cream Apply 1 Application topically daily. 20 g 0   zolpidem (AMBIEN) 10 MG tablet Take 1 tablet (10 mg total) by mouth at bedtime as needed. for sleep 60 tablet 0   No current facility-administered medications for this visit.    VITAL SIGNS: There were no vitals taken for this visit. There were no vitals filed for this visit.  Estimated body mass index is 27.09 kg/m as calculated from the following:   Height as of an earlier encounter on 02/04/24: 5\' 11"  (1.803 m).   Weight as of an earlier encounter on 02/04/24: 194 lb 3.2 oz (88.1 kg).   PERFORMANCE STATUS (ECOG) : 1 - Symptomatic but completely ambulatory  Assessment NAD RRR Normal breathing pattern AAO x3  Discussed the use of AI scribe  software for clinical note transcription with the patient, who gave verbal consent to proceed.   IMPRESSION:  Zachary Reid presents to clinic today for symptom management follow-up and ongoing goals of care discussions. He is accompanied by his wife. Denies concerns with nausea, vomiting, or diarrhea. Feels these symptoms are controlled when they occur. Ongoing fatigue which causes him to spend most of his day in the bed or recliner.   We discussed his pain at length. Pawel reports pain fluctates. Some days are better than others. He is taking oxycodone 10-20mg  every 4 hours as needed, Gabapentin 600mg  three times per day. Wife reports patient is having to take oxycodone around the clock for his pain. He was previously prescribed MS Contin however this was discontinued due to hallucinations and vivid dreams. Education provided on use of long-acting to gain better pain control in the setting of comfort  focus. I discussed use of oxycodone ER in addition to his short-acting oxycodone. He inquires about use of other medications given plans to discharge home on hospice. I discussed use of medication with the ability to escalate based on symptom burden and changes. He and wife verbalized understanding.   Goals of Care  Zachary Reid was seen by Dr. Mosetta Putt today. Unfortunately his cancer has progressed and recommendations are now to focus on comfort for what time he has left. He and wife are realistic in their understanding. Zachary Reid speaks to his 3 months or less prognosis. We discussed focusing on one day at a time making memories and enjoying it day by day with family. He is hopeful that he will show some improvement in his symptoms allowing him to do a little more before his health begins to significantly decline and symptom burden increases.   Education provided on healthcare limitations. Patient and his wife confirm DNR/DNI. Education provided on MOST form. Zachary Reid and his wife outlined their wishes  for the following treatment decisions:  Cardiopulmonary Resuscitation: Do Not Attempt Resuscitation (DNR/No CPR)  Medical Interventions: Comfort Measures: Keep clean, warm, and dry. Use medication by any route, positioning, wound care, and other measures to relieve pain and suffering. Use oxygen, suction and manual treatment of airway obstruction as needed for comfort. Do not transfer to the hospital unless comfort needs cannot be met in current location.  Antibiotics: Determine use of limitation of antibiotics when infection occurs  IV Fluids: No IV fluids (provide other measures to ensure comfort)  Feeding Tube: No feeding tube    Extensive education provided on hospice's goals and philosophy of care including what care will look like in the home, symptom management, and inpatient hospice unit if needed. Izaac and his wife verbalized understanding and appreciation. They wish to proceed with hospice referral identified as [facility]. Referral to be placed. Patient and wife understands he does not need to return to the cancer center for future appointments allowing the hospice medical team to provide care in the home.   All questions answered and support provided.  Assessment and Plan  Chronic Cancer Related Pain Persistent Pain. Patient did not tolerate MS Contin. This has been discontinued. Education provided on use of Oxycodone ER. Continue Oxycodone IR and Gabapentin.  --Gabapentin 600mg  TID, MS Contin 15mg  every 12 hours, and -Oxycodone 10-20mg  every 4 hours as needed.  Marlowe Kays 9mg  every 12 hours   Goals of Care Patient seen by Dr. Mosetta Putt. Recommendations for outpatient hospice in the setting of disease progression. Poor prognosis. Mr. Nile and wife are realistic in their understanding. Clear in expressed goals to aggressively manage symptoms to minimize suffering and allowing him to spend what time he has left as good as he can.  -DNR/DNI (form completed and given to  patient) -MOST form completed. Given to patient. See above -Referral to [facility] -No follow-up needed.   Patient expressed understanding and was in agreement with this plan. He also understands that He can call the clinic at any time with any questions, concerns, or complaints.   Any controlled substances utilized were prescribed in the context of palliative care. PDMP has been reviewed.   Visit consisted of counseling and education dealing with the complex and emotionally intense issues of symptom management and palliative care in the setting of serious and potentially life-threatening illness.  Willette Alma, AGPCNP-BC  Palliative Medicine Team/[facility]

## 2024-02-04 NOTE — Progress Notes (Signed)
 Nutrition Follow-up:   Patient with metastatic pancreatic cancer. Now with progression in lungs. He is no longer candidate for additional therapy. Patient agreeable to hospice.  Met with pt in clinic. Wife is present at visit. Pt reports appetite is hit or miss. Someday's he eats really well. Others he is not interested at all. This is the same for water. Pt reports currently enjoying minute maid lemonade. He denies nausea, vomiting, diarrhea, constipation.   Medications: reviewed   Labs: glucose 115, albumin 3.0  Anthropometrics: Wt 194 lb 3.2 oz increased   3/4 - 186 lb 9.6 oz  2/6 - 190 lb 12.8 oz    NUTRITION DIAGNOSIS: Unintended wt loss improved    INTERVENTION:  Suggested snacking in between meals as able Suggested soft moist foods for ease of intake - handout with ideas provided     MONITORING, EVALUATION, GOAL: Pt to eat/drink as desired    NEXT VISIT: No follow-up scheduled. Contact information provided. Encouraged to call with nutrition questions/concerns

## 2024-02-04 NOTE — Progress Notes (Signed)
 [facility]   [phone] [phone]   Clinic Follow up Note   Patient Care Team: Lucky Cowboy, MD as PCP - General (Internal Medicine) Pollyann Samples, NP as PCP - Hematology/Oncology (Nurse Practitioner) Thomasene Ripple, DO as PCP - Cardiology (Cardiology) Nadara Mustard, MD as Consulting Physician (Orthopedic Surgery) Laurey Morale, MD as Consulting Physician (Cardiology) Malachy Mood, MD as Consulting Physician (Oncology) Pickenpack-Cousar, Arty Baumgartner, NP as Nurse Practitioner [facility] and Palliative Medicine)  Date of Service:  02/04/2024  CHIEF COMPLAINT: f/u of pancreatic cancer  CURRENT THERAPY:  4TH LINE FOLFOX  Oncology History   Pancreatic adenocarcinoma (HCC) [id number] with numerous small cavitary lesions in bilateral lungs, MMR normal  -diagnosed 07/2021, after incidental finding on CT scan for kidney stone, by colonoscopy/EUS. -Baseline CA 19-9 elevated to 273 on 07/03/21 -He began first-line mFOLFIRINOX on 08/13/21, but required dose reduction and move to every 3 weeks. -his CA 19-9 trended up but is now stable. -restaging CT CAP 08/12/22 showed: multiple new and enlarging pulmonary metastases; pancreatic mass grossly stable. Will repeat in 3 months. -we changed his irinotecan to liposomal on 09/05/22. He tolerated better and feels stronger. -Due to disease progression, his treatment was changed to gemcitabine and Abraxane every 2 weeks on 01/02/2023 -He tolerated the first two cycles chemotherapy very well with mild fatigue.  I increased gemcitabine to 1000mg /m2 from cycle 3 -Restaging CT scan 03/12/2023 showed stable disease overall. His primary pancreatic tumor is slightly increased in size, however his multiple cavitary lung nodules are slightly improved with thinner walls now.   -will continue current therapy.  He is overall tolerating well, but he has developed worsening fatigue lately.  I will reduce gemcitabine dose to 800 mg/m  again. -His tumor marker CA 19.9 continues trending down, which is a good clinical sign. -restaging CT 07/22/2023 showed moderate disease progression at the primary site and lung mets, I personally reviewed the scan images with patient and his wife in detail. -I changed his chemo to liposomal irinotecan and 5-FU pump infusion every 2 weeks on 08/07/2023 with dose reduction due to fatigue  -restaging CT 10/22/23 showed progression in lungs  -will change chemo to dose reduced FOLFOX starting 11/26/23  -He understands the goal of chemotherapy is palliative, to prolong his life   Assessment and Plan    Metastatic pancreatic cancer Metastatic pancreatic cancer with significant progression in the liver, adrenal glands, and portal vein on recent CT scan. Liver metastasis has worsened, causing fatigue and pain. Adrenal gland metastasis, particularly on the left, may contribute to back pain. Portal vein thrombosis likely contributes to lower extremity edema. Cancer progression continues despite FOLFOX chemotherapy, with increased weakness, limited mobility, significant fatigue, reduced appetite, and weight changes, likely due to fluid retention. Further chemotherapy is unlikely to benefit and may worsen quality of life. Focus is on palliative care to improve quality of life and manage symptoms. Hospice care is recommended for support and medical care at home.  I discussed the hospice service in detail with patient and his wife.  He agreed.  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 Treatment in this context is controversial  and may  not be beneficial.  Lower extremity edema Significant swelling in feet and ankles, likely due to portal vein thrombosis and impaired venous return, contributing to limited mobility and discomfort. - Recommend use of compression stockings to reduce swelling - Advise elevating feet when sitting or lying down  Goals of Care, DNR  Transition to palliative care, focusing on quality of life rather than aggressive treatment. He understands and accepts the prognosis and decision to discontinue chemotherapy. Emphasis on managing symptoms and maintaining comfort. Hospice care recommended for support and medical care at home. DNR in place, with anticipatory guidance on symptom management and end-of-life care provided. Prognosis likely less than three months, with focus on maintaining quality of life and comfort. - Initiate hospice care services - Discuss and document DNR status - Provide anticipatory guidance on symptom management and end-of-life care     Plan -CT scan reviewed, unfortunately showed significant disease progression. -Due to his worsening performance status, and lack of standard therapy, I recommend hospice home care, he agrees. -Will refer to [facility] [place] -Patient was seen by dietitian and palliative care NP [facility] today.    SUMMARY OF ONCOLOGIC HISTORY: Oncology History Overview Note   Cancer Staging  Pancreatic adenocarcinoma [facility]) Staging form: Exocrine Pancreas, AJCC 8th Edition - Clinical stage from 07/26/2021: Stage IV (cT4, cN0, cM1) - Signed by Malachy Mood, MD on 07/28/2021    Pancreatic adenocarcinoma (HCC)  07/02/2021 Initial Diagnosis   Pancreatic adenocarcinoma (HCC)   07/26/2021 Cancer Staging   Staging form: Exocrine Pancreas, AJCC 8th Edition - Clinical stage from 07/26/2021: Stage IV (cT4, cN0, cM1) - Signed by Malachy Mood, MD on 07/28/2021 Stage prefix: Initial diagnosis Total positive nodes: 0   08/13/2021 - 07/06/2022 Chemotherapy   Patient is on Treatment Plan  : PANCREAS Modified FOLFIRINOX q14d x 4 cycles      Genetic Testing   [facility] CancerNext-Expanded results (77 genes) were negative. No pathogenic variants were identified. 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 The report date is 08/15/2021.    The CancerNext-Expanded gene panel offered by [facility] and includes sequencing, rearrangement, and RNA analysis for the following 77 genes: AIP, ALK, APC, ATM, AXIN2, BAP1, BARD1, BLM, BMPR1A, BRCA1, BRCA2, BRIP1, CDC73, CDH1, CDK4, CDKN1B, CDKN2A, CHEK2, CTNNA1, DICER1, FANCC, FH, FLCN, GALNT12, KIF1B, LZTR1, MAX, MEN1, MET, MLH1, MSH2, MSH3, MSH6, MUTYH, NBN, NF1, NF2, NTHL1, PALB2, PHOX2B, PMS2, POT1, PRKAR1A, PTCH1, PTEN, RAD51C, RAD51D, RB1, RECQL, RET, SDHA, SDHAF2, SDHB, SDHC, SDHD, SMAD4, SMARCA4, SMARCB1, SMARCE1, STK11, SUFU, TMEM127, TP53, TSC1, TSC2, VHL and XRCC2 (sequencing and deletion/duplication); EGFR, EGLN1, HOXB13, KIT, MITF, PDGFRA, POLD1, and POLE (sequencing only); EPCAM and GREM1 (deletion/duplication only).     08/13/2021 - 12/06/2022 Chemotherapy   Patient is on Treatment Plan : PANCREAS Modified FOLFIRINOX q14d x 4 cycles     10/25/2021 Imaging   EXAM: CT CHEST, ABDOMEN, AND PELVIS WITH CONTRAST  IMPRESSION: 1. Innumerable bilateral pulmonary nodules, many of which are cavitary, consistent with metastatic disease. These nodules show no substantial change in are minimally progressed in the interval. 2. Mix cystic and solid lesion in the head and body of the pancreas is similar to prior and also comparing back to MRI 07/02/2021. 3. Hepatic cysts. 4. 8 mm nonobstructing left renal stone. 5. Aortic Atherosclerosis (ICD10-I70.0).   01/28/2022 Imaging   EXAM: CT CHEST, ABDOMEN, AND PELVIS WITH CONTRAST  IMPRESSION: 1. Innumerable bilateral small solid and cavitary pulmonary nodules, some of the cavitary nodules  are slightly less thick-walled when compared with prior exam. 2. Mixed cystic  and solid lesion in the head and body of the pancreas is similar to prior exam. 3. Nonobstructing left renal stone. 4.  Aortic Atherosclerosis (ICD10-I70.0).   11/29/2022 Imaging    IMPRESSION: 1. No significant change in primary pancreatic mass and adjacent cystic component. Unchanged appearance of soft tissue extending to involve the adjacent celiac axis, superior mesenteric artery origin, and portal confluence. 2. 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 3. Innumerable small pulmonary nodules throughout the lungs, the majority of which are cavitary. Although interval change is difficult to appreciate for the majority of these nodules due to small size, at least some of these are slightly enlarged, consistent with worsened pulmonary metastatic disease. 4. Newly enlarged left retroperitoneal lymph nodes, which may reflect nodal metastatic disease or perhaps reactive to left hydronephrosis. Attention on follow-up. 5. A sizable calculus previously seen in the left renal pelvis has migrated to the middle third of the left ureter, with placement of a double-J left ureteral stent, with formed pigtails in the left renal pelvis and urinary bladder. Moderate left hydronephrosis and proximal hydroureter. 6. Coronary artery disease.   Aortic Atherosclerosis (ICD10-I70.0).   01/02/2023 - 06/26/2023 Chemotherapy   Patient is on Treatment Plan : PANCREATIC Abraxane D1,8,15 + Gemcitabine D1,8,15 q28d     03/12/2023 Imaging    IMPRESSION: 1. Primary pancreatic mass with adjacent cystic component is slightly increased in size when compared with the prior exam. Similar soft tissue extending involve the celiac axis superior mesenteric artery origin and portal confluence. 2. Innumerable bilateral pulmonary nodules, some of which are cavitary, unchanged when compared with the prior. 3. Left retroperitoneal lymph nodes are  decreased in size, likely resolving reactive adenopathy 4. Interval removal of left ureter stent.  No hydronephrosis. 5. coronary artery disease and aortic Atherosclerosis (ICD10-I70.0).   07/22/2023 Imaging   CT CAP W CONTRAST   IMPRESSION: 1. Today's study demonstrates progression of disease as evidenced by enlargement of the primary pancreatic mass, mild enlargement of numerous prominent borderline enlarged and mildly enlarged upper abdominal and retroperitoneal lymph nodes, and increased number and size of numerous metastatic nodules scattered throughout the lungs bilaterally, as detailed above. 2. 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 Main portal vein remains patent at this time. 3. 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 4. Nonobstructive nephrolithiasis in the kidneys bilaterally measuring 2-4 mm in size. 5. Aortic atherosclerosis, in addition to left main and three-vessel coronary artery disease. 6. Additional incidental findings, as above.   08/07/2023 - 10/24/2023 Chemotherapy   Patient is on Treatment Plan : PANCREAS Liposomal Irinotecan + Leucovorin + 5-FU IVCI q14d     11/26/2023 - 01/20/2024 Chemotherapy   Patient is on Treatment Plan : PANCREAS FOLFOX q14d        Discussed the use of AI scribe software for clinical note transcription with the patient, who gave verbal consent to proceed.  History of Present Illness   The patient, with a history of cancer, presents with increased fatigue and acute kidney pain, predominantly on the right side. The pain is described as severe and different from previous kidney stone pain. The patient has difficulty getting up and walking, with his legs described as not working. The patient's ankles are swollen, and he reports a decrease in appetite. The patient  also reports an increase in coughing, which  was previously a non-issue. The patient's spouse reports that the patient's mobility has significantly decreased over the past two weeks, with the patient now shuffling when he walks. The patient's spouse also reports that the patient's appetite has decreased, with the patient often not finishing his meals.         All other systems were reviewed with the patient and are negative.  MEDICAL HISTORY:  Past Medical History:  Diagnosis Date   Adenocarcinoma of pancreas, stage 4 (HCC) 07/2021   oncologist--- dr Mosetta Putt;   mets to  bilateral lung;  started chemo 08-13-2021   Anemia    GAD (generalized anxiety disorder)    History of adenomatous polyp of colon    Hyperlipidemia    Hypogonadism male    IBS (irritable bowel syndrome)    Insomnia    Left ureteral calculus    Metastatic cancer to lung (HCC) 07/2021   primary pancreatic cancer w/ numerous small cavity lesions bilateral lungs   PAF (paroxysmal atrial fibrillation) (HCC) 10/31/2022   cardiologist-- dr Kirtland Bouchard. tobb;  newly dx ED 10-31-2022 w/ RVR  in setting flank pain (kidney stone)  --  office note in epic 11-07-2022 normal ETT 11-13-2022, echo 11-07-2022 ef 65-70% w/ mild LVH, mild MR,  zio monitor not completed,  started on toprol and xarelto daily   Personal history of chemotherapy    Receiving chemotherapy for metastatic pancreatic cancer. Most recent infusion (as of 12/02/22) was on 11/15/22.   Port-A-Cath in place 08/10/2021   Type 2 diabetes mellitus treated with insulin [facility])    endocrinologist--- dr Lucianne Muss   Vitamin D deficiency    takes Vit D supplements   Wears contact lenses     SURGICAL HISTORY: Past Surgical History:  Procedure Laterality Date   APPENDECTOMY  1988   BIOPSY  07/26/2021   Procedure: BIOPSY;  Surgeon: Lemar Lofty., MD;  Location: [facility] ENDOSCOPY;  Service: Gastroenterology;;   COLONOSCOPY WITH PROPOFOL N/A 07/26/2021   Procedure: COLONOSCOPY WITH PROPOFOL;  Surgeon: Lemar Lofty., MD;   Location: [facility] ENDOSCOPY;  Service: Gastroenterology;  Laterality: N/A;   CYSTOSCOPY WITH STENT PLACEMENT Left 10/31/2022   Procedure: CYSTOSCOPY WITH STENT PLACEMENT;  Surgeon: Belva Agee, MD;  Location: [facility] ORS;  Service: Urology;  Laterality: Left;   CYSTOSCOPY/URETEROSCOPY/HOLMIUM LASER/STENT PLACEMENT Left 12/03/2022   Procedure: CYSTOSCOPY/LEFT URETEROSCOPY/HOLMIUM LASER/STENT EXCHANGE;  Surgeon: Belva Agee, MD;  Location: [facility];  Service: Urology;  Laterality: Left;  1 HR FOR CASE   ESOPHAGOGASTRODUODENOSCOPY (EGD) WITH PROPOFOL N/A 07/26/2021   Procedure: ESOPHAGOGASTRODUODENOSCOPY (EGD) WITH PROPOFOL;  Surgeon: Meridee Score Netty Starring., MD;  Location: [facility] ENDOSCOPY;  Service: Gastroenterology;  Laterality: N/A;   EUS N/A 07/26/2021   Procedure: UPPER ENDOSCOPIC ULTRASOUND (EUS) RADIAL;  Surgeon: Lemar Lofty., MD;  Location: [facility] ENDOSCOPY;  Service: Gastroenterology;  Laterality: N/A;   FINE NEEDLE ASPIRATION  07/26/2021   Procedure: FINE NEEDLE ASPIRATION (FNA) LINEAR;  Surgeon: Meridee Score Netty Starring., MD;  Location: [facility] ENDOSCOPY;  Service: Gastroenterology;;   IR IMAGING GUIDED PORT INSERTION  08/10/2021   POLYPECTOMY  07/26/2021   Procedure: POLYPECTOMY;  Surgeon: Meridee Score Netty Starring., MD;  Location: [facility] ENDOSCOPY;  Service: Gastroenterology;;   SHOULDER SURGERY Right    as a teenager    I have reviewed the social history and family history with the patient and they are unchanged from previous note.  ALLERGIES:  is allergic to lipitor [atorvastatin].  MEDICATIONS:  Current  Outpatient Medications  Medication Sig Dispense Refill   ALPRAZolam (XANAX) 0.5 MG tablet Take 1 tablet (0.5 mg total) by mouth 3 (three) times daily as needed for anxiety. 60 tablet 0   baclofen (LIORESAL) 10 MG tablet Take 1 tablet (10 mg total) by mouth 3 (three) times daily as needed for muscle spasms. 30 each 2   chlorproMAZINE (THORAZINE) 25 MG tablet Take 1  tablet (25 mg total) by mouth 3 (three) times daily as needed. 60 tablet 2   Cholecalciferol (VITAMIN D3) 125 MCG (5000 UT) CAPS Take 5,000 Units by mouth in the morning and at bedtime.     Continuous Glucose Sensor (DEXCOM G7 SENSOR) MISC 1 Device by Does not apply route as directed. Change sensor every 10 days 3 each 3   diphenoxylate-atropine (LOMOTIL) 2.5-0.025 MG tablet Take 1-2 tablets by mouth 4 (four) times daily as needed for diarrhea or loose stools. (Patient taking differently: Take 1-2 tablets by mouth 4 (four) times daily as needed for diarrhea or loose stools (as needed).) 30 tablet 0   gabapentin (NEURONTIN) 600 MG tablet Take 1 tablet (600 mg total) by mouth 3 (three) times daily as needed (pain). 90 tablet 0   glucose blood test strip Use as instructed 100 each 12   hyoscyamine (LEVSIN SL) 0.125 MG SL tablet DISSOLVE ONE TABLET UNDER THE TONGUE 4 TIMES DAILY UP TO EVERY 4 HOURS AS NEEDED FOR NAUSEA, BLOATING, CRAMPING, OR DIARRHEA 100 tablet 0   insulin degludec (TRESIBA FLEXTOUCH) 100 UNIT/ML FlexTouch Pen INJECT 32 UNITS INTO THE SKIN DAILY. 15 mL 4   insulin lispro (HUMALOG KWIKPEN) 100 UNIT/ML KwikPen 10 TO 15 UNITS BEFORE MEALS AS DIRECTED 15 mL 0   lipase/protease/amylase (CREON) 36000 UNITS CPEP capsule Take 1 capsule (36,000 Units total) by mouth 3 (three) times daily with meals. May also take 1 capsule (36,000 Units total) as needed (with snacks - up to 4 snacks daily). 150 capsule 11   metoprolol succinate (TOPROL-XL) 25 MG 24 hr tablet TAKE 1/2 TABLET BY MOUTH EVERY DAY 45 tablet 3   ondansetron (ZOFRAN) 8 MG tablet Take 1 tablet (8 mg total) by mouth every 8 (eight) hours as needed for nausea or vomiting. (Patient taking differently: Take 8 mg by mouth every 8 (eight) hours as needed for nausea or vomiting (as needed).) 60 tablet 1   Oxycodone HCl 10 MG TABS Take 1-2 tablets (10-20 mg total) by mouth every 4 (four) hours as needed. 120 tablet 0   prochlorperazine (COMPAZINE)  10 MG tablet Take 1 tablet (10 mg total) by mouth every 6 (six) hours as needed (Nausea or vomiting). (Patient taking differently: Take 10 mg by mouth every 6 (six) hours as needed for nausea or vomiting (as needed).) 60 tablet 1   silver sulfADIAZINE (SILVADENE) 1 % cream Apply 1 Application topically daily. 20 g 0   zolpidem (AMBIEN) 10 MG tablet Take 1 tablet (10 mg total) by mouth at bedtime as needed. for sleep 60 tablet 0   fentaNYL (DURAGESIC) 12 MCG/HR Place 1 patch onto the skin every 3 (three) days. 10 patch 0   No current facility-administered medications for this visit.    PHYSICAL EXAMINATION: ECOG PERFORMANCE STATUS: 3 - Symptomatic, >50% confined to bed  Vitals:   02/04/24 1010  BP: 98/62  Pulse: (!) 103  Resp: (!) 21  Temp: 98 F (36.7 C)  SpO2: 99%   Wt Readings from Last 3 Encounters:  02/04/24 194 lb 3.2 oz (88.1 kg)  01/20/24 186 lb 9.6 oz (84.6 kg)  01/08/24 191 lb (86.6 kg)     GENERAL:alert, no distress and comfortable SKIN: skin color, texture, turgor are normal, no rashes or significant lesions EYES: normal, Conjunctiva are pink and non-injected, sclera clear NECK: supple, thyroid normal size, non-tender, without nodularity LYMPH:  no palpable lymphadenopathy in the cervical, axillary  LUNGS: clear to auscultation and percussion with normal breathing effort HEART: regular rate & rhythm and no murmurs, (+) b/l lower extremity edema ABDOMEN:abdomen soft, non-tender and normal bowel sounds Musculoskeletal:no cyanosis of digits and no clubbing  NEURO: alert & oriented x 3 with fluent speech, no focal motor/sensory deficits  LABORATORY DATA:  I have reviewed the data as listed    Latest Ref Rng & Units 02/04/2024    9:45 AM 01/20/2024    8:03 AM 01/08/2024    9:33 AM  CBC  WBC 4.0 - 10.5 K/uL 7.0  7.9  6.4   Hemoglobin 13.0 - 17.0 g/dL 9.7  9.7  28.4   Hematocrit 39.0 - 52.0 % 29.6  28.9  30.4   Platelets 150 - 400 K/uL 110  88  84         Latest  Ref Rng & Units 02/04/2024    9:45 AM 01/20/2024    8:03 AM 01/08/2024    9:33 AM  CMP  Glucose 70 - 99 mg/dL 132  95  90   BUN 8 - 23 mg/dL 15  13  12    Creatinine 0.61 - 1.24 mg/dL 4.40  1.02  7.25   Sodium 135 - 145 mmol/L 140  140  141   Potassium 3.5 - 5.1 mmol/L 3.5  3.8  3.5   Chloride 98 - 111 mmol/L 109  110  110   CO2 22 - 32 mmol/L 25  24  25    Calcium 8.9 - 10.3 mg/dL 8.4  8.6  8.7   Total Protein 6.5 - 8.1 g/dL 5.8  6.2  5.9   Total Bilirubin 0.0 - 1.2 mg/dL 1.1  0.7  0.6   Alkaline Phos 38 - 126 U/L 163  168  151   AST 15 - 41 U/L 37  27  27   ALT 0 - 44 U/L 14  17  19        RADIOGRAPHIC STUDIES: I have personally reviewed the radiological images as listed and agreed with the findings in the report. CT CHEST ABDOMEN PELVIS W CONTRAST Result Date: 02/04/2024 CLINICAL DATA:  Pancreatic cancer restaging, assess treatment response * Tracking Code: BO * EXAM: CT CHEST, ABDOMEN, AND PELVIS WITH CONTRAST TECHNIQUE: Multidetector CT imaging of the chest, abdomen and pelvis was performed following the standard protocol during bolus administration of intravenous contrast. RADIATION DOSE REDUCTION: This exam was performed according to the departmental dose-optimization program which includes automated exposure control, adjustment of the mA and/or kV according to patient size and/or use of iterative reconstruction technique. CONTRAST:  OMNIPAQUE IOHEXOL 300 MG/ML  SOLN COMPARISON:  10/20/2023 FINDINGS: CT CHEST FINDINGS Cardiovascular: Right chest port catheter. Scattered aortic atherosclerosis. Normal heart size. Three-vessel coronary artery calcifications. No pericardial effusion. Mediastinum/Nodes: No enlarged mediastinal, hilar, or axillary lymph nodes. Thyroid gland, trachea, and esophagus demonstrate no significant findings. Lungs/Pleura: Innumerable pulmonary nodules are again seen throughout the lungs. Some of these are clearly enlarged, for example of the largest in the  posterior peripheral left upper lobe measuring 2.8 x 1.9 cm, previously 2.1 x 1.3 cm (series 10, image 63), other smaller nodules  diminished or resolved, particularly conspicuous in the apices (series 10, image 29), other nodules unchanged, for example an index nodule of the dependent right lower lobe measuring 1.7 x 1.0 cm (series 10, image 102). Many nodules are cavitary. New trace pleural effusions. Musculoskeletal: No chest wall abnormality. No acute osseous findings. CT ABDOMEN PELVIS FINDINGS Hepatobiliary: Numerous new and enlarged hypodense liver metastases, new index lesion in the posterior liver dome, hepatic segment VII, measuring 3.4 x 3.0 cm (series 7, image 80). No gallstones, gallbladder wall thickening, or biliary dilatation. Pancreas: No significant change in a hypodense mass arising from the ventral pancreatic head and neck, measuring 6.7 x 3.9 cm (series 7, image 104). Severe atrophy of the distal pancreas. Spleen: Increased splenomegaly, maximum span 18.6 cm. Adrenals/Urinary Tract: New, bulky hypodense left adrenal metastasis measuring 5.1 x 3.5 cm (series 7, image 108). Small nonobstructive bilateral renal calculi. No ureteral calculi or hydronephrosis. Bladder unremarkable. Stomach/Bowel: Stomach is within normal limits. Appendix appears normal. No evidence of bowel wall thickening, distention, or inflammatory changes. Vascular/Lymphatic: Aortic atherosclerosis. Portal vein is now almost completely thrombosed with extensive cavernous transformation and variceal collateralization throughout the abdomen. Central mesenteric vein is likewise thrombosed, with effacement of the portal confluence. Similar enlarged retroperitoneal lymph nodes, some aortocaval nodes slightly enlarged (series 7, image 118). Reproductive: Prostatomegaly. Other: Small fat containing bilateral inguinal hernias. Anasarca. New small volume ascites throughout the abdomen and pelvis without directly visible peritoneal  nodularity. Musculoskeletal: No acute osseous findings. IMPRESSION: 1. Innumerable pulmonary nodules are again seen throughout the lungs with evidence of mixed response to treatment. 2. Numerous new and enlarged hypodense liver metastases; markedly worsened hepatic metastatic disease. 3. New, bulky hypodense left adrenal metastasis. 4. Similar enlarged retroperitoneal nodal metastases; some aortocaval nodes slightly enlarged. 5. Primary pancreatic mass not significantly changed. 6. Portal vein is now almost completely thrombosed with extensive cavernous transformation and variceal collateralization throughout the abdomen. Central mesenteric vein is likewise thrombosed, with effacement of the portal confluence by pancreatic mass. 7. 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 8. Nonobstructive bilateral nephrolithiasis. 9. Coronary artery disease. Aortic Atherosclerosis (ICD10-I70.0). Electronically Signed   By: Jearld Lesch M.D.   On: 02/04/2024 09:21      No orders of the defined types were placed in this encounter.  All questions were answered. The patient knows to call the clinic with any problems, questions or concerns. No barriers to learning was detected. The total time spent in the appointment was 40 minutes.     Malachy Mood, MD 02/04/2024

## 2024-02-04 NOTE — Addendum Note (Signed)
 Addended by: Glee Arvin on: 02/04/2024 04:40 PM   Modules accepted: Orders

## 2024-02-05 ENCOUNTER — Other Ambulatory Visit (HOSPITAL_COMMUNITY): Payer: Self-pay

## 2024-02-05 LAB — CANCER ANTIGEN 19-9: CA 19-9: 1419 U/mL — ABNORMAL HIGH (ref 0–35)

## 2024-02-06 ENCOUNTER — Inpatient Hospital Stay: Payer: Medicare Other

## 2024-02-09 ENCOUNTER — Encounter: Payer: Self-pay | Admitting: Hematology

## 2024-02-10 ENCOUNTER — Other Ambulatory Visit: Payer: Self-pay

## 2024-02-16 ENCOUNTER — Encounter: Payer: Self-pay | Admitting: Hematology

## 2024-02-18 ENCOUNTER — Inpatient Hospital Stay: Payer: Medicare Other

## 2024-02-18 ENCOUNTER — Inpatient Hospital Stay: Payer: Medicare Other | Admitting: Nurse Practitioner

## 2024-02-19 LAB — GUARDANT 360

## 2024-02-20 ENCOUNTER — Inpatient Hospital Stay: Payer: Medicare Other

## 2024-02-23 ENCOUNTER — Encounter: Payer: Self-pay | Admitting: Nurse Practitioner

## 2024-02-23 NOTE — Progress Notes (Signed)
 I reviewed [id number] with Dr. Mosetta Putt and called pt with results. 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 We discussed the indication for parp inhibitor.   Mr. Fudala is sleeping most of the days, up in wheelchair some but keeps a low PS. Pain is managed with oxycodone and fentanyl patch. Still having abd and leg edema, we reviewed management strategies and pros/cons of lasix. It's difficult to get him up OOB/to bathroom so I advised against lasix.   It appears Mr. Nay does not have adequate PS to return to [facility] for lab/office visit and monitoring for parp inhibitor, which, in the setting of an acquired BRCA mutation, would likely have low benefit. Pt's wife Vernona Rieger and I agreed he should remain under hospice care to focus on comfort. She appreciated the information and call.   Santiago Glad, NP

## 2024-03-03 ENCOUNTER — Other Ambulatory Visit

## 2024-03-03 ENCOUNTER — Ambulatory Visit: Admitting: Hematology

## 2024-03-03 ENCOUNTER — Ambulatory Visit

## 2024-03-05 ENCOUNTER — Encounter

## 2024-03-16 ENCOUNTER — Other Ambulatory Visit: Payer: Self-pay

## 2024-03-18 DEATH — deceased
